# Patient Record
Sex: Male | Born: 1965 | Race: White | Hispanic: No | Marital: Single | Smoking: Never smoker
Health system: Southern US, Community
[De-identification: ages and names within clinical notes are randomized; demographics above are authoritative.]

## PROBLEM LIST (undated history)

## (undated) DIAGNOSIS — F319 Bipolar disorder, unspecified: Secondary | ICD-10-CM

## (undated) DIAGNOSIS — I10 Essential (primary) hypertension: Secondary | ICD-10-CM

## (undated) DIAGNOSIS — Z113 Encounter for screening for infections with a predominantly sexual mode of transmission: Secondary | ICD-10-CM

## (undated) DIAGNOSIS — R7309 Other abnormal glucose: Secondary | ICD-10-CM

## (undated) DIAGNOSIS — K2970 Gastritis, unspecified, without bleeding: Secondary | ICD-10-CM

## (undated) DIAGNOSIS — M26609 Unspecified temporomandibular joint disorder, unspecified side: Secondary | ICD-10-CM

## (undated) DIAGNOSIS — F909 Attention-deficit hyperactivity disorder, unspecified type: Secondary | ICD-10-CM

## (undated) DIAGNOSIS — B351 Tinea unguium: Secondary | ICD-10-CM

## (undated) DIAGNOSIS — IMO0002 Reserved for concepts with insufficient information to code with codable children: Secondary | ICD-10-CM

## (undated) DIAGNOSIS — F329 Major depressive disorder, single episode, unspecified: Secondary | ICD-10-CM

## (undated) DIAGNOSIS — F411 Generalized anxiety disorder: Secondary | ICD-10-CM

## (undated) DIAGNOSIS — Z87442 Personal history of urinary calculi: Secondary | ICD-10-CM

## (undated) DIAGNOSIS — K7689 Other specified diseases of liver: Secondary | ICD-10-CM

## (undated) DIAGNOSIS — R7303 Prediabetes: Secondary | ICD-10-CM

## (undated) DIAGNOSIS — K2990 Gastroduodenitis, unspecified, without bleeding: Secondary | ICD-10-CM

## (undated) DIAGNOSIS — M545 Low back pain: Secondary | ICD-10-CM

## (undated) DIAGNOSIS — H9190 Unspecified hearing loss, unspecified ear: Secondary | ICD-10-CM

## (undated) DIAGNOSIS — I2510 Atherosclerotic heart disease of native coronary artery without angina pectoris: Secondary | ICD-10-CM

## (undated) DIAGNOSIS — N419 Inflammatory disease of prostate, unspecified: Secondary | ICD-10-CM

## (undated) DIAGNOSIS — E7800 Pure hypercholesterolemia, unspecified: Secondary | ICD-10-CM

## (undated) DIAGNOSIS — F3289 Other specified depressive episodes: Secondary | ICD-10-CM

## (undated) DIAGNOSIS — K219 Gastro-esophageal reflux disease without esophagitis: Secondary | ICD-10-CM

## (undated) DIAGNOSIS — S83509A Sprain of unspecified cruciate ligament of unspecified knee, initial encounter: Secondary | ICD-10-CM

## (undated) DIAGNOSIS — N200 Calculus of kidney: Secondary | ICD-10-CM

## (undated) DIAGNOSIS — C801 Malignant (primary) neoplasm, unspecified: Secondary | ICD-10-CM

## (undated) DIAGNOSIS — N342 Other urethritis: Secondary | ICD-10-CM

## (undated) DIAGNOSIS — R079 Chest pain, unspecified: Secondary | ICD-10-CM

## (undated) HISTORY — DX: Pure hypercholesterolemia, unspecified: E78.00

## (undated) HISTORY — DX: Other urethritis: N34.2

## (undated) HISTORY — DX: Other abnormal glucose: R73.09

## (undated) HISTORY — DX: Unspecified temporomandibular joint disorder, unspecified side: M26.609

## (undated) HISTORY — DX: Low back pain: M54.5

## (undated) HISTORY — DX: Major depressive disorder, single episode, unspecified: F32.9

## (undated) HISTORY — DX: Encounter for screening for infections with a predominantly sexual mode of transmission: Z11.3

## (undated) HISTORY — DX: Chest pain, unspecified: R07.9

## (undated) HISTORY — DX: Calculus of kidney: N20.0

## (undated) HISTORY — DX: Inflammatory disease of prostate, unspecified: N41.9

## (undated) HISTORY — DX: Other specified depressive episodes: F32.89

## (undated) HISTORY — DX: Unspecified hearing loss, unspecified ear: H91.90

## (undated) HISTORY — DX: Generalized anxiety disorder: F41.1

## (undated) HISTORY — DX: Gastroduodenitis, unspecified, without bleeding: K29.90

## (undated) HISTORY — DX: Gastro-esophageal reflux disease without esophagitis: K21.9

## (undated) HISTORY — DX: Tinea unguium: B35.1

## (undated) HISTORY — PX: CYSTOSCOPY W/ LITHOLAPAXY / EHL: SUR377

## (undated) HISTORY — DX: Sprain of unspecified cruciate ligament of unspecified knee, initial encounter: S83.509A

## (undated) HISTORY — DX: Gastritis, unspecified, without bleeding: K29.70

## (undated) HISTORY — DX: Essential (primary) hypertension: I10

## (undated) HISTORY — PX: OTHER SURGICAL HISTORY: SHX169

## (undated) HISTORY — DX: Reserved for concepts with insufficient information to code with codable children: IMO0002

## (undated) HISTORY — DX: Other specified diseases of liver: K76.89

---

## 1999-01-09 ENCOUNTER — Emergency Department (HOSPITAL_COMMUNITY): Admission: EM | Admit: 1999-01-09 | Discharge: 1999-01-09 | Payer: Self-pay | Admitting: Emergency Medicine

## 1999-01-10 ENCOUNTER — Encounter: Payer: Self-pay | Admitting: Emergency Medicine

## 2003-04-22 ENCOUNTER — Encounter: Admission: RE | Admit: 2003-04-22 | Discharge: 2003-04-22 | Payer: Self-pay | Admitting: Family Medicine

## 2005-10-29 ENCOUNTER — Ambulatory Visit: Payer: Self-pay | Admitting: Family Medicine

## 2006-02-07 ENCOUNTER — Ambulatory Visit: Payer: Self-pay | Admitting: Family Medicine

## 2006-02-21 ENCOUNTER — Ambulatory Visit: Payer: Self-pay

## 2006-05-23 ENCOUNTER — Ambulatory Visit: Payer: Self-pay | Admitting: Family Medicine

## 2006-05-23 LAB — CONVERTED CEMR LAB
AST: 39 units/L — ABNORMAL HIGH (ref 0–37)
Albumin: 4.3 g/dL (ref 3.5–5.2)
Alkaline Phosphatase: 56 units/L (ref 39–117)
BUN: 20 mg/dL (ref 6–23)
Basophils Absolute: 0 10*3/uL (ref 0.0–0.1)
Basophils Relative: 1.1 % — ABNORMAL HIGH (ref 0.0–1.0)
Chloride: 107 meq/L (ref 96–112)
Creatinine, Ser: 1.1 mg/dL (ref 0.4–1.5)
HCT: 49 % (ref 39.0–52.0)
MCHC: 34.1 g/dL (ref 30.0–36.0)
Neutrophils Relative %: 46.4 % (ref 43.0–77.0)
RBC: 5.92 M/uL — ABNORMAL HIGH (ref 4.22–5.81)
RDW: 11.7 % (ref 11.5–14.6)
Sodium: 143 meq/L (ref 135–145)
Total Bilirubin: 1.2 mg/dL (ref 0.3–1.2)
Total CHOL/HDL Ratio: 6.7
Triglycerides: 232 mg/dL (ref 0–149)
VLDL: 46 mg/dL — ABNORMAL HIGH (ref 0–40)

## 2006-06-20 ENCOUNTER — Ambulatory Visit: Payer: Self-pay | Admitting: Family Medicine

## 2006-06-20 LAB — CONVERTED CEMR LAB
Chlamydia, DNA Probe: NEGATIVE
GC Probe Amp, Genital: NEGATIVE

## 2006-09-05 ENCOUNTER — Telehealth (INDEPENDENT_AMBULATORY_CARE_PROVIDER_SITE_OTHER): Payer: Self-pay | Admitting: *Deleted

## 2006-09-06 ENCOUNTER — Ambulatory Visit: Payer: Self-pay | Admitting: Family Medicine

## 2006-09-06 DIAGNOSIS — N342 Other urethritis: Secondary | ICD-10-CM | POA: Insufficient documentation

## 2006-09-06 DIAGNOSIS — K7689 Other specified diseases of liver: Secondary | ICD-10-CM | POA: Insufficient documentation

## 2006-09-06 DIAGNOSIS — K219 Gastro-esophageal reflux disease without esophagitis: Secondary | ICD-10-CM | POA: Insufficient documentation

## 2006-09-06 DIAGNOSIS — E7800 Pure hypercholesterolemia, unspecified: Secondary | ICD-10-CM

## 2006-09-06 DIAGNOSIS — N419 Inflammatory disease of prostate, unspecified: Secondary | ICD-10-CM | POA: Insufficient documentation

## 2006-09-06 DIAGNOSIS — F411 Generalized anxiety disorder: Secondary | ICD-10-CM | POA: Insufficient documentation

## 2006-09-06 DIAGNOSIS — J209 Acute bronchitis, unspecified: Secondary | ICD-10-CM | POA: Insufficient documentation

## 2006-09-06 DIAGNOSIS — E785 Hyperlipidemia, unspecified: Secondary | ICD-10-CM | POA: Insufficient documentation

## 2006-09-06 DIAGNOSIS — I10 Essential (primary) hypertension: Secondary | ICD-10-CM | POA: Insufficient documentation

## 2006-09-06 DIAGNOSIS — F329 Major depressive disorder, single episode, unspecified: Secondary | ICD-10-CM | POA: Insufficient documentation

## 2006-11-14 ENCOUNTER — Ambulatory Visit: Payer: Self-pay | Admitting: Family Medicine

## 2007-01-09 ENCOUNTER — Ambulatory Visit: Payer: Self-pay | Admitting: Family Medicine

## 2007-01-09 DIAGNOSIS — K2990 Gastroduodenitis, unspecified, without bleeding: Secondary | ICD-10-CM

## 2007-01-09 DIAGNOSIS — K2970 Gastritis, unspecified, without bleeding: Secondary | ICD-10-CM | POA: Insufficient documentation

## 2007-04-10 ENCOUNTER — Ambulatory Visit: Payer: Self-pay | Admitting: Family Medicine

## 2007-04-10 DIAGNOSIS — B351 Tinea unguium: Secondary | ICD-10-CM | POA: Insufficient documentation

## 2007-04-11 LAB — CONVERTED CEMR LAB
BUN: 13 mg/dL (ref 6–23)
Bilirubin, Direct: 0.1 mg/dL (ref 0.0–0.3)
Chloride: 100 meq/L (ref 96–112)
Cholesterol: 270 mg/dL (ref 0–200)
Creatinine, Ser: 1.2 mg/dL (ref 0.4–1.5)
Direct LDL: 161.5 mg/dL
GFR calc non Af Amer: 71 mL/min
HDL: 35.8 mg/dL — ABNORMAL LOW (ref >39.0)
Phosphorus: 3.4 mg/dL (ref 2.3–4.6)
Total CHOL/HDL Ratio: 7.5
Triglycerides: 372 mg/dL (ref 0–149)
VLDL: 74 mg/dL — ABNORMAL HIGH (ref 0–40)

## 2007-07-03 ENCOUNTER — Ambulatory Visit: Payer: Self-pay | Admitting: Family Medicine

## 2007-07-03 DIAGNOSIS — R7989 Other specified abnormal findings of blood chemistry: Secondary | ICD-10-CM | POA: Insufficient documentation

## 2007-07-04 LAB — CONVERTED CEMR LAB
AST: 27 units/L (ref 0–37)
HDL: 36.4 mg/dL — ABNORMAL LOW (ref >39.0)
Triglycerides: 398 mg/dL (ref 0–149)

## 2007-07-10 ENCOUNTER — Ambulatory Visit: Payer: Self-pay | Admitting: Family Medicine

## 2007-07-10 DIAGNOSIS — R7309 Other abnormal glucose: Secondary | ICD-10-CM

## 2007-07-10 DIAGNOSIS — R7303 Prediabetes: Secondary | ICD-10-CM | POA: Insufficient documentation

## 2007-07-10 HISTORY — DX: Other abnormal glucose: R73.09

## 2007-09-11 ENCOUNTER — Ambulatory Visit: Payer: Self-pay | Admitting: Family Medicine

## 2007-10-09 ENCOUNTER — Ambulatory Visit: Payer: Self-pay | Admitting: Family Medicine

## 2007-10-10 LAB — CONVERTED CEMR LAB
Cholesterol: 290 mg/dL (ref 0–200)
Direct LDL: 141.8 mg/dL
Total CHOL/HDL Ratio: 7.7
Triglycerides: 624 mg/dL (ref 0–149)

## 2007-10-16 ENCOUNTER — Ambulatory Visit: Payer: Self-pay | Admitting: Family Medicine

## 2007-10-27 ENCOUNTER — Telehealth (INDEPENDENT_AMBULATORY_CARE_PROVIDER_SITE_OTHER): Payer: Self-pay | Admitting: *Deleted

## 2007-12-04 ENCOUNTER — Ambulatory Visit: Payer: Self-pay | Admitting: Family Medicine

## 2007-12-26 LAB — CONVERTED CEMR LAB
AST: 31 units/L (ref 0–37)
Albumin: 4.6 g/dL (ref 3.5–5.2)
BUN: 24 mg/dL — ABNORMAL HIGH (ref 6–23)
Calcium: 9.3 mg/dL (ref 8.4–10.5)
Creatinine, Ser: 0.9 mg/dL (ref 0.4–1.5)
HDL: 41.7 mg/dL (ref >39.0)
Phosphorus: 3.7 mg/dL (ref 2.3–4.6)
Potassium: 4.1 meq/L (ref 3.5–5.1)
Triglycerides: 94 mg/dL (ref 0–149)
VLDL: 19 mg/dL (ref 0–40)

## 2008-03-18 ENCOUNTER — Telehealth: Payer: Self-pay | Admitting: Family Medicine

## 2008-03-18 DIAGNOSIS — H9190 Unspecified hearing loss, unspecified ear: Secondary | ICD-10-CM | POA: Insufficient documentation

## 2008-05-28 ENCOUNTER — Ambulatory Visit: Payer: Self-pay | Admitting: Family Medicine

## 2008-05-28 DIAGNOSIS — M25539 Pain in unspecified wrist: Secondary | ICD-10-CM | POA: Insufficient documentation

## 2008-05-28 DIAGNOSIS — M2390 Unspecified internal derangement of unspecified knee: Secondary | ICD-10-CM | POA: Insufficient documentation

## 2008-05-29 ENCOUNTER — Encounter: Admission: RE | Admit: 2008-05-29 | Discharge: 2008-05-29 | Payer: Self-pay | Admitting: Family Medicine

## 2008-05-29 ENCOUNTER — Telehealth: Payer: Self-pay | Admitting: Family Medicine

## 2008-05-30 ENCOUNTER — Encounter: Admission: RE | Admit: 2008-05-30 | Discharge: 2008-05-30 | Payer: Self-pay | Admitting: Family Medicine

## 2008-05-31 ENCOUNTER — Encounter: Admission: RE | Admit: 2008-05-31 | Discharge: 2008-05-31 | Payer: Self-pay | Admitting: Family Medicine

## 2008-05-31 DIAGNOSIS — IMO0002 Reserved for concepts with insufficient information to code with codable children: Secondary | ICD-10-CM | POA: Insufficient documentation

## 2008-05-31 DIAGNOSIS — S83509A Sprain of unspecified cruciate ligament of unspecified knee, initial encounter: Secondary | ICD-10-CM | POA: Insufficient documentation

## 2008-06-03 ENCOUNTER — Encounter: Payer: Self-pay | Admitting: Family Medicine

## 2008-06-10 ENCOUNTER — Ambulatory Visit: Payer: Self-pay | Admitting: Family Medicine

## 2008-07-22 ENCOUNTER — Ambulatory Visit: Payer: Self-pay | Admitting: Family Medicine

## 2008-07-23 LAB — CONVERTED CEMR LAB
ALT: 27 units/L (ref 0–53)
HDL: 35.6 mg/dL — ABNORMAL LOW (ref >39.00)
Total CHOL/HDL Ratio: 4
Triglycerides: 225 mg/dL — ABNORMAL HIGH (ref 0.0–149.0)
VLDL: 45 mg/dL — ABNORMAL HIGH (ref 0.0–40.0)

## 2008-12-02 ENCOUNTER — Telehealth: Payer: Self-pay | Admitting: Family Medicine

## 2008-12-03 ENCOUNTER — Encounter: Payer: Self-pay | Admitting: Family Medicine

## 2008-12-04 ENCOUNTER — Ambulatory Visit: Payer: Self-pay | Admitting: Family Medicine

## 2008-12-04 DIAGNOSIS — R079 Chest pain, unspecified: Secondary | ICD-10-CM

## 2008-12-04 HISTORY — DX: Chest pain, unspecified: R07.9

## 2008-12-05 ENCOUNTER — Ambulatory Visit: Payer: Self-pay | Admitting: Cardiology

## 2008-12-26 ENCOUNTER — Ambulatory Visit (HOSPITAL_COMMUNITY): Admission: RE | Admit: 2008-12-26 | Discharge: 2008-12-26 | Payer: Self-pay | Admitting: Cardiology

## 2008-12-26 ENCOUNTER — Ambulatory Visit: Payer: Self-pay | Admitting: Cardiology

## 2008-12-26 ENCOUNTER — Ambulatory Visit: Payer: Self-pay

## 2008-12-26 ENCOUNTER — Encounter: Payer: Self-pay | Admitting: Cardiology

## 2009-01-18 ENCOUNTER — Ambulatory Visit: Payer: Self-pay | Admitting: Family Medicine

## 2009-01-18 ENCOUNTER — Encounter: Payer: Self-pay | Admitting: Family Medicine

## 2009-01-19 ENCOUNTER — Telehealth: Payer: Self-pay | Admitting: Family Medicine

## 2009-01-20 ENCOUNTER — Ambulatory Visit: Payer: Self-pay | Admitting: Family Medicine

## 2009-01-22 ENCOUNTER — Telehealth: Payer: Self-pay | Admitting: Family Medicine

## 2009-10-29 ENCOUNTER — Ambulatory Visit: Payer: Self-pay | Admitting: Family Medicine

## 2009-10-29 DIAGNOSIS — M25519 Pain in unspecified shoulder: Secondary | ICD-10-CM | POA: Insufficient documentation

## 2009-10-29 DIAGNOSIS — B360 Pityriasis versicolor: Secondary | ICD-10-CM | POA: Insufficient documentation

## 2009-10-31 LAB — CONVERTED CEMR LAB
AST: 37 units/L (ref 0–37)
BUN: 21 mg/dL (ref 6–23)
Chloride: 104 meq/L (ref 96–112)
Direct LDL: 168.3 mg/dL
GFR calc non Af Amer: 91.04 mL/min (ref >60)
Glucose, Bld: 89 mg/dL (ref 70–99)
HDL: 50.2 mg/dL (ref >39.00)
Phosphorus: 3.2 mg/dL (ref 2.3–4.6)
Potassium: 4.1 meq/L (ref 3.5–5.1)
Total CHOL/HDL Ratio: 5
VLDL: 59.8 mg/dL — ABNORMAL HIGH (ref 0.0–40.0)

## 2010-04-12 ENCOUNTER — Encounter: Payer: Self-pay | Admitting: Orthopedic Surgery

## 2010-04-19 LAB — CONVERTED CEMR LAB
Blood in Urine, dipstick: NEGATIVE
Nitrite: NEGATIVE
Protein, U semiquant: NEGATIVE
Urobilinogen, UA: 0.2
WBC Urine, dipstick: NEGATIVE

## 2010-04-21 NOTE — Assessment & Plan Note (Signed)
Summary: shoulder pain/alc   Vital Signs:  Patient profile:   45 year old male Height:      66 inches Weight:      194.50 pounds BMI:     31.51 Temp:     98.3 degrees F oral Pulse rate:   80 / minute Pulse rhythm:   regular BP sitting:   124 / 86  (left arm) Cuff size:   large  Vitals Entered By: Lewanda Rife LPN (October 29, 2009 3:42 PM) CC: both shoulders hurt on and off for one yr. Pain level now is 6.   History of Present Illness: has been living with shoulder pain for a while  2 years ago noticed it in L shoulder - when working out (on forced abduction) then started in R shoulder (R handed)  now all the time sharp pains when doing something   is more painful to abduct R shoulder than L  very sharp when moving the wrong way- dull otherwise  ? 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  no major trauma  shoots down the arm   tried some aleve - only helped a little  no ice or heat   needs refil of shampoo for tinea versicolor  usually works after 1 applic  mood is much better   is overdue for lab/ lipids  diet is fair overall and no trouble with meds   Allergies: 1)  Sulfa  Past History:  Past Medical History: Last updated: 12/04/2008 Current Problems:  MEDIAL MENISCUS TEAR, LEFT (ICD-836.0) ACL TEAR, LEFT KNEE (ICD-844.2) WRIST PAIN, LEFT (ICD-719.43) WRIST PAIN, RIGHT (ICD-719.43) UNSPECIFIED INTERNAL DERANGEMENT OF KNEE (ICD-717.9) PROBLEMS WITH HEARING (ICD-V41.2) HYPERGLYCEMIA (ICD-790.29) OTHER ABNORMAL BLOOD CHEMISTRY (ICD-790.6) FERRITIN, ELEVATED (ICD-790.6) DERMATOPHYTOSIS OF NAIL (ICD-110.1) GASTRITIS (ICD-535.50) BRONCHITIS, ACUTE (ICD-466.0) URETHRITIS NOS (ICD-597.80) Hx of PROSTATITIS, MILD (ICD-601.9) Hx of FATTY LIVER DISEASE (ICD-571.8) Hx of HYPERCHOLESTEROLEMIA (ICD-272.0) HYPERTENSION (ICD-401.9) GERD (ICD-530.81) DEPRESSION (ICD-311) ANXIETY (ICD-300.00)  Past Surgical History: Last updated:  12/05/2008 none  Family History: Last updated: 12/05/2008 Father: Congestive heart failure, DM, HTN  Social History: Last updated: 12/05/2008 Marital Status: single Children: 0 Occupation: owns own motorcycle shop non smoker Alcohol Use - yes  Review of Systems General:  Denies fatigue, loss of appetite, and malaise. Eyes:  Denies blurring and eye irritation. CV:  Denies chest pain or discomfort, fatigue, and lightheadness. Resp:  Denies cough, pleuritic, and wheezing. GI:  Denies abdominal pain and change in bowel habits. MS:  Complains of joint pain and stiffness; denies joint redness, joint swelling, cramps, and muscle weakness. Derm:  Denies lesion(s) and rash. Neuro:  Denies numbness, tingling, and weakness. Psych:  mood is fairly stable . Endo:  Denies cold intolerance, excessive thirst, excessive urination, and heat intolerance. Heme:  Denies abnormal bruising and bleeding.  Physical Exam  General:  overweight but generally well appearing  Head:  normocephalic, atraumatic, and no abnormalities observed.   Eyes:  vision grossly intact, pupils equal, and pupils round.   Ears:  pt talks loudly  Mouth:  pharynx pink and moist.   Neck:  supple with full rom and no masses or thyromegally, no JVD or carotid bruit  Lungs:  Normal respiratory effort, chest expands symmetrically. Lungs are clear to auscultation, no crackles or wheezes. Heart:  Normal rate and regular rhythm. S1 and S2 normal without gallop, murmur, click, rub or other extra sounds. Msk:  R shoulder -limited abduction and int rot due to pain no swelling /  heat or deformity no acrom tenderness pos hawking/ neer tests  no crepitice  nl L shoulder exam  Extremities:  No clubbing, cyanosis, edema, or deformity noted with normal full range of motion of all joints.   Neurologic:  sensation intact to light touch, sensation intact to pinprick, and gait normal.   Skin:  few hypopigmented oval lesions on back  consistent with prev tinea versicolor Cervical Nodes:  No lymphadenopathy noted Psych:  normal affect, talkative and pleasant    Impression & Recommendations:  Problem # 1:  SHOULDER PAIN, BILATERAL (ICD-719.41) Assessment New  worse on R suspect rot cuff tendonitis recommend relative rest/ passive rom exercises/ ice /nsaid for 2 wk if not imp- f/u Dr Patsy Lager  His updated medication list for this problem includes:    Mobic 15 Mg Tabs (Meloxicam) .Marland Kitchen... 1 by mouth once daily with food for 14 days  Orders: Prescription Created Electronically [phone])  Problem # 2:  Hx of HYPERCHOLESTEROLEMIA (ICD-272.0) Assessment: Unchanged  overdue for labs  done today rev low sat fat diet  realize not fasting His updated medication list for this problem includes:    Zocor 20 Mg Tabs (Simvastatin) .Marland Kitchen... 1 by mouth once daily in evening  Orders: Venipuncture (60454) TLB-Lipid Panel (80061-LIPID) TLB-Renal Function Panel (80069-RENAL) TLB-ALT (SGPT) (84460-ALT) TLB-AST (SGOT) (84450-SGOT) Prescription Created Electronically [phone])  Labs Reviewed: SGOT: 23 (07/22/2008)   SGPT: 27 (07/22/2008)   HDL:35.60 (07/22/2008), 41.7 (12/04/2007)  LDL:DEL (12/04/2007), DEL (10/09/2007)  Chol:152 (07/22/2008), 225 (12/04/2007)  Trig:225.0 (07/22/2008), 94 (12/04/2007)  Problem # 3:  HYPERTENSION (ICD-401.9) Assessment: Unchanged good control with norvasc  lab today His updated medication list for this problem includes:    Norvasc 5 Mg Tabs (Amlodipine besylate) .Marland Kitchen... 1 by mouth each am  Orders: Venipuncture (91478) TLB-Lipid Panel (80061-LIPID) TLB-Renal Function Panel (80069-RENAL) TLB-ALT (SGPT) (84460-ALT) TLB-AST (SGOT) (84450-SGOT) Prescription Created Electronically [id number])  BP today: 124/86 Prior BP: 122/90 (01/20/2009)  Labs Reviewed: K+: 4.1 (12/04/2007) Creat: : 0.9 (12/04/2007)   Chol: 152 (07/22/2008)   HDL: 35.60 (07/22/2008)   LDL: DEL (12/04/2007)   TG: 225.0  (07/22/2008)  Problem # 4:  TINEA VERSICOLOR (ICD-111.0)  mild and occasional on back  refil nizoral 2% which he uses occ topically His updated medication list for this problem includes:    Nizoral 2 % Sham (Ketoconazole) .Marland Kitchen... Apply to affected area- let sit 5 minutes and then rinse (can use twice per week)  Orders: Prescription Created Electronically [phone])  Complete Medication List: 1)  Norvasc 5 Mg Tabs (Amlodipine besylate) .Marland Kitchen.. 1 by mouth each am 2)  Zoloft 100 Mg Tabs (Sertraline hcl) .... Take 1  by mouth daily 3)  Multivitamins Tabs (Multiple vitamin) .... Take 1 tablet by mouth once a day 4)  Fish Oil Oil (Fish oil) .... Take 1 tablet by mouth once a day 5)  Zocor 20 Mg Tabs (Simvastatin) .Marland Kitchen.. 1 by mouth once daily in evening 6)  Mobic 15 Mg Tabs (Meloxicam) .Marland Kitchen.. 1 by mouth once daily with food for 14 days 7)  Nizoral 2 % Sham (Ketoconazole) .... Apply to affected area- let sit 5 minutes and then rinse (can use twice per week)  Patient Instructions: 1)  use ice on shoulder two times a day for 10 minutes 2)  take mobic 15 mg once daily for 14 days (with food)  3)  try to rest shoulder as much as possible  4)  do finger crawl wall exercise  5)  in 2 weeks - if  not a lot better - call for appt with Dr Patsy Lager  Prescriptions: NIZORAL 2 % SHAM (KETOCONAZOLE) apply to affected area- let sit 5 minutes and then rinse (can use twice per week)  #1 med x 0   Entered and Authorized by:   Judith Part MD   Signed by:   Judith Part MD on 10/29/2009   Method used:   [facility] to        [id number]       [address]       [place], [place]  [address]       Ph: [phone]       Fax: [phone]   RxID:   [id number] MOBIC 15 MG TABS (MELOXICAM) 1 by mouth once daily with food for 14 days  #30 x 0   Entered and Authorized by:   Judith Part MD   Signed by:   Judith Part MD on 10/29/2009   Method used:    [facility] to        [id number]       [address]       [place], [place]  [address]       Ph: [phone]       Fax: [phone]   RxID:   [id number]   Current Allergies (reviewed today): SULFA

## 2010-05-27 ENCOUNTER — Encounter: Payer: Self-pay | Admitting: Family Medicine

## 2010-06-30 ENCOUNTER — Other Ambulatory Visit: Payer: Self-pay | Admitting: Family Medicine

## 2010-07-14 ENCOUNTER — Ambulatory Visit (INDEPENDENT_AMBULATORY_CARE_PROVIDER_SITE_OTHER): Payer: BC Managed Care – PPO | Admitting: Family Medicine

## 2010-07-14 ENCOUNTER — Encounter: Payer: Self-pay | Admitting: Family Medicine

## 2010-07-14 VITALS — BP 136/94 | HR 80 | Temp 98.5°F | Ht 66.5 in | Wt 213.0 lb

## 2010-07-14 DIAGNOSIS — L03211 Cellulitis of face: Secondary | ICD-10-CM | POA: Insufficient documentation

## 2010-07-14 DIAGNOSIS — L0201 Cutaneous abscess of face: Secondary | ICD-10-CM

## 2010-07-14 MED ORDER — CLINDAMYCIN HCL 300 MG PO CAPS: 300.0000 mg | ORAL_CAPSULE | Freq: Three times a day (TID) | ORAL | Status: AC

## 2010-07-14 NOTE — Progress Notes (Signed)
  Subjective:    Patient ID: Glenn Stewart, male    DOB: [birth year], 45 y.o.   MRN: [id number]  HPI CC: earache and eye swelling  Several day h/o earache, knot anterior to ear, as well as starting puffiness around eye and rash on forehead.  This morning eye puffy shut closed.  No drainage, vision changes, mating of eyelashes.  Swelling improved with movement.  Also with mild headache.  Rash not itchy, + painful.  Mild ST.  Decreased hearing R side.  No fevers/chills, abd pain, n/v/d, cough.  No drainage from ears, ringing, vision changes, pain with eye movement.  No cough, recent viral illness.  May have started as ST initially then progressed to ear/eye sxs.  No tooth pain.  No sick contacts.  No smokers at home.  No ho asthma/allergies.  Recently restarted abilify 1 1/2 mo ago.  Has tried triple abx cream as well as sweet oil.  Medications and allergies reviewed and updated as above. PMhx reviewed for relevance.  Review of Systems Per HPI    Objective:   Physical Exam  Constitutional: He appears well-developed and well-nourished. No distress.  HENT:  Head: Normocephalic and atraumatic.    Right Ear: Tympanic membrane, external ear and ear canal normal. Decreased hearing is noted.  Left Ear: Tympanic membrane, external ear and ear canal normal. Decreased hearing is noted.  Nose: Nose normal.  Mouth/Throat: Oropharynx is clear and moist. No oropharyngeal exudate.       Erythematous rash L forehead 3 separate macular lesions, minimal crusting. Decreased hearing at baseline per patient. No trismus. No TMJ tenderness, no maxillary tenderness.  Eyes: Conjunctivae and EOM are normal. Pupils are equal, round, and reactive to light. No scleral icterus.       EOMI without pain.  + L eyelid swelling  Neck: Normal range of motion. Neck supple. No thyromegaly present.  Cardiovascular: Normal rate, regular rhythm, normal heart sounds and intact distal pulses.   No murmur  heard. Pulmonary/Chest: Effort normal and breath sounds normal. No respiratory distress. He has no wheezes. He has no rales.  Lymphadenopathy:       Head (right side): No tonsillar, no preauricular, no posterior auricular and no occipital adenopathy present.       Head (left side): Preauricular and posterior auricular adenopathy present. No tonsillar and no occipital adenopathy present.    He has no cervical adenopathy.  Neurological: No cranial nerve deficit.  Skin: Skin is warm and dry. Rash noted.  Psychiatric: He has a normal mood and affect.          Assessment & Plan:

## 2010-07-14 NOTE — Assessment & Plan Note (Signed)
Suspicious for preseptal cellulitis.  Start clindamycin tid x 10 days, update [place] if red flags. Discussed concern near eye, but no evidence of orbital cellulitis currently. Ok to treat as outpatient discussed need to return if worsening or not improving as expected. ? Viral illness prior leading to cellulitis.

## 2010-07-14 NOTE — Patient Instructions (Addendum)
I think you have a facial cellulitis. Treat with antibiotics three times daily for 10 days.  Take some yogurt for stomach while on antibiotics. Warm compresses on eye/forehead daily to help resolve quicker. If fever >101.5, spreading redness or swelling or pain, or pain with eye movements, please let us know as this is close in proximity to the eye.

## 2010-07-22 ENCOUNTER — Encounter: Payer: Self-pay | Admitting: Family Medicine

## 2010-07-22 ENCOUNTER — Ambulatory Visit (INDEPENDENT_AMBULATORY_CARE_PROVIDER_SITE_OTHER): Payer: BC Managed Care – PPO | Admitting: Family Medicine

## 2010-07-22 DIAGNOSIS — Z0000 Encounter for general adult medical examination without abnormal findings: Secondary | ICD-10-CM | POA: Insufficient documentation

## 2010-07-22 DIAGNOSIS — L0201 Cutaneous abscess of face: Secondary | ICD-10-CM

## 2010-07-22 DIAGNOSIS — F411 Generalized anxiety disorder: Secondary | ICD-10-CM

## 2010-07-22 DIAGNOSIS — L03211 Cellulitis of face: Secondary | ICD-10-CM

## 2010-07-22 DIAGNOSIS — E7800 Pure hypercholesterolemia, unspecified: Secondary | ICD-10-CM

## 2010-07-22 DIAGNOSIS — R7309 Other abnormal glucose: Secondary | ICD-10-CM

## 2010-07-22 DIAGNOSIS — I10 Essential (primary) hypertension: Secondary | ICD-10-CM

## 2010-07-22 DIAGNOSIS — H9190 Unspecified hearing loss, unspecified ear: Secondary | ICD-10-CM

## 2010-07-22 LAB — COMPREHENSIVE METABOLIC PANEL
ALT: 51 U/L (ref 0–53)
AST: 36 U/L (ref 0–37)
Alkaline Phosphatase: 49 U/L (ref 39–117)
CO2: 29 mEq/L (ref 19–32)
Creatinine, Ser: 1 mg/dL (ref 0.4–1.5)
Sodium: 139 mEq/L (ref 135–145)
Total Bilirubin: 1 mg/dL (ref 0.3–1.2)
Total Protein: 7.2 g/dL (ref 6.0–8.3)

## 2010-07-22 LAB — CBC WITH DIFFERENTIAL/PLATELET
Basophils Absolute: 0 10*3/uL (ref 0.0–0.1)
Eosinophils Absolute: 0.2 10*3/uL (ref 0.0–0.7)
HCT: 47.6 % (ref 39.0–52.0)
Hemoglobin: 16 g/dL (ref 13.0–17.0)
Lymphs Abs: 2.6 10*3/uL (ref 0.7–4.0)
MCHC: 33.7 g/dL (ref 30.0–36.0)
Monocytes Relative: 5.3 % (ref 3.0–12.0)
Neutro Abs: 3.7 10*3/uL (ref 1.4–7.7)
Platelets: 226 10*3/uL (ref 150.0–400.0)
RDW: 13.1 % (ref 11.5–14.6)

## 2010-07-22 LAB — LIPID PANEL
Cholesterol: 227 mg/dL — ABNORMAL HIGH (ref 0–200)
HDL: 43.3 mg/dL (ref >39.00)
Total CHOL/HDL Ratio: 5
Triglycerides: 416 mg/dL — ABNORMAL HIGH (ref 0.0–149.0)
VLDL: 83.2 mg/dL — ABNORMAL HIGH (ref 0.0–40.0)

## 2010-07-22 LAB — LDL CHOLESTEROL, DIRECT: Direct LDL: 125.9 mg/dL

## 2010-07-22 MED ORDER — HYDROCODONE-ACETAMINOPHEN 5-500 MG PO TABS: 1.0000 | ORAL_TABLET | Freq: Four times a day (QID) | ORAL | Status: DC | PRN

## 2010-07-22 MED ORDER — AMLODIPINE BESYLATE 5 MG PO TABS: 5.0000 mg | ORAL_TABLET | Freq: Every day | ORAL | Status: DC

## 2010-07-22 MED ORDER — SIMVASTATIN 20 MG PO TABS: 20.0000 mg | ORAL_TABLET | Freq: Every day | ORAL | Status: DC

## 2010-07-22 NOTE — Assessment & Plan Note (Signed)
Reviewed health habits including diet and exercise and skin cancer prevention Also reviewed health mt list, fam hx and immunizations  Labs today Will sched Tdap in 1 mo when facial symptoms resolve

## 2010-07-22 NOTE — Assessment & Plan Note (Signed)
This may be worse with wt gain/ poor eating and abilify Disc s/s of DM (has fam hx) Lab today a1c  Disc low glycemic diet

## 2010-07-22 NOTE — Progress Notes (Signed)
Subjective:    Patient ID: Glenn Stewart, male    DOB: 1965/12/11, 45 y.o.   MRN: [id number]  HPI here for health mt exam and also re check cellulitis and new headache Was seen recently by Dr Reece Agar for facial cellulitis (preseptal) -- which started as an earache and swollen glands  tx with clindamycin for 10 days for this  This is healing very well - pleased with that  Eye is no longer swollen  Skin is dry and itchy as it heals   Now having headache- a stabbing sensation -- off and on  ? Stress related  Is hurting at place about eye  No vision problems    Td - is due for when his face is better   Lipids-- is due for check  Is having a hard time with his diet  Is an emotional eater- and food is comfort for him  Eats at the end of the day - not even the right foods  Has disc this with his psychiatrist   Added abilify not too long ago  Felt better at a lower dosage -- and feels worse  Seen every 6 months  meds keep him from having outbursts  Very stressful work situation at this time  Dx with anxiety disorder- not bipolar  Is easily overwhelmed  Takes xanax for emergencies  Does not sleep well in generally   Still has hearing problems -- is going to go for evaluations  Will get some hearing aides   HTN in good control with 124/78  Hx of fatty liver  Hx of hyperglycemia   Wt is down 1 lb with 33 bmi (had lost a lot and then gained it back again (was down to 190) No regular exercise other than work - wants to get started with something  Heavy lifting at work and not in great shape for it   No prostate problems  Drinking more water         Review of Systems Review of Systems  Constitutional: Negative for fever, appetite change, fatigue and unexpected weight change.  Eyes: Negative for pain and visual disturbance.  Respiratory: Negative for cough and shortness of breath.   Cardiovascular: Negative.   Gastrointestinal: Negative for nausea, diarrhea and  constipation.  Genitourinary: Negative for urgency and frequency.  Skin: Negative for pallor. , pos for rash- almost healed  Neurological: Negative for weakness, light-headedness, numbness, pos for headaches Hematological: Negative for adenopathy. Does not bruise/bleed easily.  Psychiatric/Behavioral: Negative for dysphoric mood. The patient is not nervous/anxious.          Objective:   Physical Exam  Constitutional: He appears well-developed and well-nourished. No distress.       overwt and well appearing   HENT:  Head: Normocephalic and atraumatic.  Right Ear: External ear normal.  Nose: Nose normal.  Mouth/Throat: Oropharynx is clear and moist.       No temporal tenderness   Eyes: EOM are normal. Pupils are equal, round, and reactive to light. Right eye exhibits no discharge. Left eye exhibits no discharge. No scleral icterus.       No orbital swelling   Neck: Normal range of motion. Neck supple. Carotid bruit is not present. No thyromegaly present.  Cardiovascular: Normal rate, regular rhythm and normal heart sounds.   Pulmonary/Chest: Breath sounds normal. No respiratory distress. He has no wheezes.  Abdominal: Soft. Bowel sounds are normal. He exhibits no abdominal bruit and no mass. There is no tenderness.  Genitourinary: Rectum normal and prostate normal. Guaiac negative stool.  Musculoskeletal: Normal range of motion. He exhibits no edema and no tenderness.  Lymphadenopathy:    He has no cervical adenopathy.  Neurological: He is alert. He has normal reflexes. No cranial nerve deficit. Coordination normal.  Skin: Skin is warm and dry. Rash noted. No erythema. No pallor.       Few faint healing papules over L temple and forehead  Rash is almost gone No redness or swelling of eye or face  Psychiatric: He has a normal mood and affect.       Less anxious than usual Talks very loud           Assessment & Plan:

## 2010-07-22 NOTE — Assessment & Plan Note (Signed)
Pt is going to be eval for hearing aids  I suspect this will imp quality of life

## 2010-07-22 NOTE — Assessment & Plan Note (Signed)
Good control  Urged to work on wt loss and exercise again

## 2010-07-22 NOTE — Assessment & Plan Note (Signed)
Labs today on zocor  Is also on amlodipine with no problems but if chol up or myalgias will need to switch agents (disc this with pt )  Will also check lfts

## 2010-07-22 NOTE — Assessment & Plan Note (Signed)
Looking good - healing well In light of headaches - which are improving - I do suspect he may have had mild zoster No ocular involvement but will follow closely vicodin given prn severe ha with caution

## 2010-07-22 NOTE — Assessment & Plan Note (Signed)
Urged to continue f/u with Dr Delle Reining for med management Disc stress reduction May need to consider career change

## 2010-07-22 NOTE — Patient Instructions (Addendum)
Please schedule nurse visit in 1 month for Tdap vaccine (when your facial pain is improved ) I think the headache is related to the rash you had -- possibly shingles  Work on regular exercise 5 days per week  Try the vicodin with caution for pain  Please update me asap if worse headache or any vision or eye changes  Labs today  Get your hearing checked

## 2010-08-27 ENCOUNTER — Ambulatory Visit (INDEPENDENT_AMBULATORY_CARE_PROVIDER_SITE_OTHER): Payer: BC Managed Care – PPO | Admitting: Family Medicine

## 2010-08-27 DIAGNOSIS — Z23 Encounter for immunization: Secondary | ICD-10-CM

## 2010-08-27 NOTE — Progress Notes (Signed)
rn visit

## 2010-09-22 ENCOUNTER — Ambulatory Visit (INDEPENDENT_AMBULATORY_CARE_PROVIDER_SITE_OTHER): Payer: BC Managed Care – PPO | Admitting: Family Medicine

## 2010-09-22 ENCOUNTER — Encounter: Payer: Self-pay | Admitting: Family Medicine

## 2010-09-22 VITALS — BP 118/84 | HR 84 | Temp 98.1°F | Ht 66.5 in | Wt 192.8 lb

## 2010-09-22 DIAGNOSIS — N200 Calculus of kidney: Secondary | ICD-10-CM | POA: Insufficient documentation

## 2010-09-22 DIAGNOSIS — R319 Hematuria, unspecified: Secondary | ICD-10-CM | POA: Insufficient documentation

## 2010-09-22 DIAGNOSIS — M549 Dorsalgia, unspecified: Secondary | ICD-10-CM

## 2010-09-22 HISTORY — DX: Calculus of kidney: N20.0

## 2010-09-22 LAB — POCT URINALYSIS DIPSTICK
Glucose, UA: NEGATIVE
Nitrite, UA: NEGATIVE
Spec Grav, UA: 1.01
Urobilinogen, UA: 0.2
pH, UA: 6

## 2010-09-22 MED ORDER — HYDROCODONE-ACETAMINOPHEN 5-500 MG PO TABS: 1.0000 | ORAL_TABLET | ORAL | Status: DC | PRN

## 2010-09-22 NOTE — Progress Notes (Signed)
Subjective:    Patient ID: Glenn Stewart, male    DOB: [birth year], 45 y.o.   MRN: [id number]  HPI Here for R side abd and back pain  Passed kidney stone a week ago That day had pain into low abd and R back and chill-- lasted 15 -20 min and got better The next day passed a rather large stone - brought it with him   Pain started again several days ago -- same nature with a chill as well  Pain some sand goes and severe at times Strange odor in urine occas- comes and goes ( did eat asparagus)    Has had multiple stones in past - 3 in his life- but not in 15 years  [facility] Isabel Caprice in the past   Mouth feels dry - but drinking lots of water    Wt is down 20 lb - eating healthy - no sweets or bread - no sodas  Some power aide  Drinks lots of water   Also started focalin xr from his psychiatrist   Patient Active Problem List  Diagnoses  . DERMATOPHYTOSIS OF NAIL  . TINEA VERSICOLOR  . HYPERCHOLESTEROLEMIA  . ANXIETY  . DEPRESSION  . HYPERTENSION  . BRONCHITIS, ACUTE  . GERD  . GASTRITIS  . FATTY LIVER DISEASE  . Pain in joint, forearm  . CHEST PAIN  . HYPERGLYCEMIA  . MEDIAL MENISCUS TEAR, LEFT  . ACL TEAR, LEFT KNEE  . PROBLEMS WITH HEARING  . Facial cellulitis  . Routine general medical examination at a health care facility  . Kidney stones  . Hematuria   Past Medical History  Diagnosis Date  . Tear of medial cartilage or meniscus of knee, current   . Sprain of cruciate ligament of knee   . Pain in joint, forearm   . Unspecified internal derangement of knee   . Problems with hearing   . Other abnormal glucose   . Other abnormal blood chemistry   . Dermatophytosis of nail   . Unspecified gastritis and gastroduodenitis without mention of hemorrhage   . Acute bronchitis   . Urethritis, unspecified   . Prostatitis, unspecified   . Other chronic nonalcoholic liver disease   . Pure hypercholesterolemia   . Unspecified essential hypertension   .  Esophageal reflux   . Depressive disorder, not elsewhere classified   . Anxiety state, unspecified    No past surgical history on file. History  Substance Use Topics  . Smoking status: Never Smoker   . Smokeless tobacco: Not on file  . Alcohol Use: Yes   Family History  Problem Relation Age of Onset  . Diabetes Father   . Hypertension Father    Allergies  Allergen Reactions  . Sulfonamide Derivatives     REACTION: hives   Current Outpatient Prescriptions on File Prior to Visit  Medication Sig Dispense Refill  . amLODipine (NORVASC) 5 MG tablet Take 1 tablet (5 mg total) by mouth daily.  30 tablet  11  . fish oil-omega-3 fatty acids 1000 MG capsule Take 2 g by mouth daily.        Marland Kitchen HYDROcodone-acetaminophen (VICODIN) 5-500 MG per tablet Take 1 tablet by mouth every 6 (six) hours as needed for pain (watch for sedation ).  20 tablet  0  . Multiple Vitamin (MULTIVITAMIN) capsule Take 1 capsule by mouth daily.        . sertraline (ZOLOFT) 100 MG tablet Take 100 mg by mouth  daily.        . simvastatin (ZOCOR) 20 MG tablet Take 1 tablet (20 mg total) by mouth at bedtime.  30 tablet  11  . ARIPiprazole (ABILIFY) 5 MG tablet Take 5 mg by mouth daily.        Marland Kitchen ketoconazole (NIZORAL) 2 % shampoo Apply 1 application topically 2 (two) times a week.            Review of Systems Review of Systems  Constitutional: Negative for fever, appetite change, fatigue and unexpected weight change.  Eyes: Negative for pain and visual disturbance.  Respiratory: Negative for cough and shortness of breath.   Cardiovascular: Negative.  for cp or palp Gastrointestinal: Negative for nausea, diarrhea and constipation.  Genitourinary: Negative for urgency and frequency. or hematuria / pos for back pain and side pain Skin: Negative for pallor.  Neurological: Negative for weakness, light-headedness, numbness and headaches.  Hematological: Negative for adenopathy. Does not bruise/bleed easily.    Psychiatric/Behavioral: Negative for dysphoric mood. The patient is not nervous/anxious.          Objective:   Physical Exam  Constitutional: He appears well-developed and well-nourished. No distress.       Weight loss noted Mildly uncomfortable today  HENT:  Head: Normocephalic and atraumatic.  Eyes: Conjunctivae and EOM are normal. Pupils are equal, round, and reactive to light.  Neck: Normal range of motion. Neck supple. No thyromegaly present.  Cardiovascular: Normal rate, regular rhythm and normal heart sounds.   Pulmonary/Chest: Effort normal and breath sounds normal. No respiratory distress. He has no wheezes.  Abdominal: Soft. Bowel sounds are normal. He exhibits no distension and no mass. There is tenderness. There is no rebound and no guarding.       Mild R sided suprapubic tenderness without rebound or gaurding   Musculoskeletal: Normal range of motion.       Mild R CVA tenderness  Lymphadenopathy:    He has no cervical adenopathy.  Neurological: He is alert. He has normal reflexes. Coordination normal.  Skin: Skin is warm and dry. No rash noted. No erythema. No pallor.  Psychiatric: He has a normal mood and affect.          Assessment & Plan:

## 2010-09-22 NOTE — Assessment & Plan Note (Signed)
Recent passage of kidney stone -- with residual and intermittent colicky pain over R flank and back  ua shows rbc and some wbc  Sent urine for cx  Also CT of abd/ pelvis without contrast  Pend result May need to see urol vicodin prn

## 2010-09-22 NOTE — Patient Instructions (Signed)
We will set you up for CT scan at check out  Drink lots of water  We will update you with results of that and also urine culture If pain worsens or fever - please update me  Take vicodin if needed for severe pain

## 2010-09-28 ENCOUNTER — Other Ambulatory Visit: Payer: BC Managed Care – PPO

## 2010-09-28 ENCOUNTER — Encounter: Payer: Self-pay | Admitting: Family Medicine

## 2010-09-29 ENCOUNTER — Telehealth: Payer: Self-pay | Admitting: Family Medicine

## 2010-09-29 NOTE — Telephone Encounter (Signed)
Patient called cancelled CT Scan. He is feeling better and no longer wants it. I have cancelled the order in Epic.

## 2010-09-29 NOTE — Telephone Encounter (Signed)
Thank you - will re schedule if his symptoms return

## 2010-11-06 ENCOUNTER — Other Ambulatory Visit: Payer: Self-pay

## 2010-11-06 MED ORDER — KETOCONAZOLE 2 % EX SHAM: 1.0000 "application " | MEDICATED_SHAMPOO | CUTANEOUS | Status: DC

## 2010-11-06 NOTE — Telephone Encounter (Signed)
[facility] faxed refill request for Ketoconazole 2% shampoo. #13ml x 0 refill. Dr Milinda Antis said OK to refill.

## 2010-12-24 ENCOUNTER — Other Ambulatory Visit: Payer: Self-pay | Admitting: *Deleted

## 2010-12-25 ENCOUNTER — Ambulatory Visit (INDEPENDENT_AMBULATORY_CARE_PROVIDER_SITE_OTHER): Payer: BC Managed Care – PPO | Admitting: Family Medicine

## 2010-12-25 ENCOUNTER — Encounter: Payer: Self-pay | Admitting: Family Medicine

## 2010-12-25 VITALS — BP 124/80 | HR 88 | Temp 98.1°F | Ht 66.5 in | Wt 178.2 lb

## 2010-12-25 DIAGNOSIS — M26609 Unspecified temporomandibular joint disorder, unspecified side: Secondary | ICD-10-CM

## 2010-12-25 HISTORY — DX: Unspecified temporomandibular joint disorder, unspecified side: M26.609

## 2010-12-25 MED ORDER — MELOXICAM 15 MG PO TABS: 15.0000 mg | ORAL_TABLET | Freq: Every day | ORAL | Status: DC

## 2010-12-25 NOTE — Progress Notes (Signed)
Subjective:    Patient ID: Glenn Stewart, male    DOB: 1965-11-27, 45 y.o.   MRN: [id number]  HPI Is having pain in L ear - and a little tender  (is a sharp pain)  Opening his mouth makes it worse , no clicking or dislocating , but hard for him to eat  Is worse at night - ?  Grinds his teeth  No uri symptom  ? Wondering about TMJ Some dull headache on and off - is over whole head , not throbbing (worse if stressed out or talking a lot)  No change in hearing or visiont No fever  Ibuprofen not helping  No tooth pain     Lost 40 lb- very proud of it  Eating salmon and chicken No fried foods No sweets  Is eating vegetables  No exercise so far- but work is somewhat physical  Needs to get on a treadmill when he watches TV   Patient Active Problem List  Diagnoses  . DERMATOPHYTOSIS OF NAIL  . TINEA VERSICOLOR  . HYPERCHOLESTEROLEMIA  . ANXIETY  . DEPRESSION  . HYPERTENSION  . BRONCHITIS, ACUTE  . GERD  . GASTRITIS  . FATTY LIVER DISEASE  . Pain in joint, forearm  . CHEST PAIN  . HYPERGLYCEMIA  . MEDIAL MENISCUS TEAR, LEFT  . ACL TEAR, LEFT KNEE  . PROBLEMS WITH HEARING  . Facial cellulitis  . Routine general medical examination at a health care facility  . Kidney stones  . Hematuria  . TMJ (temporomandibular joint disorder)   Past Medical History  Diagnosis Date  . Tear of medial cartilage or meniscus of knee, current   . Sprain of cruciate ligament of knee   . Pain in joint, forearm   . Unspecified internal derangement of knee   . Problems with hearing   . Other abnormal glucose   . Other abnormal blood chemistry   . Dermatophytosis of nail   . Unspecified gastritis and gastroduodenitis without mention of hemorrhage   . Acute bronchitis   . Urethritis, unspecified   . Prostatitis, unspecified   . Other chronic nonalcoholic liver disease   . Pure hypercholesterolemia   . Unspecified essential hypertension   . Esophageal reflux   . Depressive disorder,  not elsewhere classified   . Anxiety state, unspecified    No past surgical history on file. History  Substance Use Topics  . Smoking status: Never Smoker   . Smokeless tobacco: Not on file  . Alcohol Use: Yes   Family History  Problem Relation Age of Onset  . Diabetes Father   . Hypertension Father    Allergies  Allergen Reactions  . Sulfonamide Derivatives     REACTION: hives   Current Outpatient Prescriptions on File Prior to Visit  Medication Sig Dispense Refill  . amLODipine (NORVASC) 5 MG tablet Take 1 tablet (5 mg total) by mouth daily.  30 tablet  11  . dexmethylphenidate (FOCALIN XR) 15 MG 24 hr capsule Take 30 mg by mouth daily.        . fish oil-omega-3 fatty acids 1000 MG capsule Take 2 g by mouth daily.        . Multiple Vitamin (MULTIVITAMIN) capsule Take 1 capsule by mouth daily.        . sertraline (ZOLOFT) 100 MG tablet Take 100 mg by mouth daily.        . simvastatin (ZOCOR) 20 MG tablet Take 1 tablet (20 mg total) by mouth at bedtime.  30 tablet  11  . ARIPiprazole (ABILIFY) 5 MG tablet Take 5 mg by mouth daily.        Marland Kitchen HYDROcodone-acetaminophen (VICODIN) 5-500 MG per tablet Take 1 tablet by mouth every 6 (six) hours as needed for pain (watch for sedation ).  20 tablet  0  . HYDROcodone-acetaminophen (VICODIN) 5-500 MG per tablet Take 1 tablet by mouth every 4 (four) hours as needed for pain.  20 tablet  0  . ketoconazole (NIZORAL) 2 % shampoo Apply 1 application topically 2 (two) times a week. Let sit 5 minutes and then rinse.  120 mL  0       Review of Systems Review of Systems  Constitutional: Negative for fever, appetite change, fatigue and unexpected weight change.  Eyes: Negative for pain and visual disturbance.  ENT pos for ear and jaw pain/ neg for sinus congestion/ st  Respiratory: Negative for cough and shortness of breath.   Cardiovascular: Negative for cp or palpitations    Gastrointestinal: Negative for nausea, diarrhea and constipation.    Genitourinary: Negative for urgency and frequency.  Skin: Negative for pallor or rash   Neurological: Negative for weakness, light-headedness, numbness and headaches.  Hematological: Negative for adenopathy. Does not bruise/bleed easily.  Psychiatric/Behavioral:depression and anxiety have improved .          Objective:   Physical Exam  Constitutional: He appears well-developed and well-nourished. No distress.       Wt loss noted   HENT:  Head: Normocephalic and atraumatic.  Right Ear: External ear normal.  Left Ear: External ear normal.  Nose: Nose normal.  Mouth/Throat: Oropharynx is clear and moist.       No sinus tenderness Notable tenderness over L TM joint with some clicking  Pt cannot open mouth wide due to pain there  Also painful to move jaw laterally No skin change or swelling noted   Ear canal and TM nl app  Baseline poor hearing  Eyes: Conjunctivae and EOM are normal. Pupils are equal, round, and reactive to light. Right eye exhibits no discharge. Left eye exhibits no discharge.  Neck: Normal range of motion. Neck supple. No JVD present. Carotid bruit is not present. No thyromegaly present.  Cardiovascular: Normal rate, regular rhythm, normal heart sounds and intact distal pulses.  Exam reveals no gallop.   Pulmonary/Chest: Effort normal and breath sounds normal. No respiratory distress. He has no wheezes.  Musculoskeletal: Normal range of motion. He exhibits tenderness. He exhibits no edema.       Tender L TM joint   Lymphadenopathy:    He has no cervical adenopathy.  Neurological: He is alert. He has normal reflexes. No cranial nerve deficit. Coordination normal.  Skin: Skin is warm and dry. No rash noted. No erythema. No pallor.  Psychiatric: He has a normal mood and affect.          Assessment & Plan:

## 2010-12-25 NOTE — Assessment & Plan Note (Signed)
With tenderness over the joint - ref pain to ear- worse with opening mouth Given handout on condition Disc sympt care- mobic/ heat/ ice Also lifestyle modification - straw/ avoid gum/ chewy foods Will see dentist about bite plate or dental splint also

## 2010-12-25 NOTE — Patient Instructions (Addendum)
I think you have TMJ referring pain to the ear  Try to avoid chewy foods and gum Sip all fluids through a straw See the dentist about a possible bite plate to stop grinding teeth at night  Try to talk without opening your mouth very wide Use ice or heat (whichever feels better) when you can  Take mobic as directed (anti inflammatory for pain ) Update me if not improving in 2 weeks

## 2011-04-20 ENCOUNTER — Ambulatory Visit (INDEPENDENT_AMBULATORY_CARE_PROVIDER_SITE_OTHER): Payer: BC Managed Care – PPO | Admitting: Family Medicine

## 2011-04-20 ENCOUNTER — Encounter: Payer: Self-pay | Admitting: Family Medicine

## 2011-04-20 VITALS — BP 136/96 | HR 84 | Temp 98.5°F | Wt 192.5 lb

## 2011-04-20 DIAGNOSIS — Z113 Encounter for screening for infections with a predominantly sexual mode of transmission: Secondary | ICD-10-CM

## 2011-04-20 DIAGNOSIS — J209 Acute bronchitis, unspecified: Secondary | ICD-10-CM

## 2011-04-20 HISTORY — DX: Encounter for screening for infections with a predominantly sexual mode of transmission: Z11.3

## 2011-04-20 MED ORDER — GUAIFENESIN-CODEINE 100-10 MG/5ML PO SYRP: 5.0000 mL | ORAL_SOLUTION | Freq: Every evening | ORAL | Status: AC | PRN

## 2011-04-20 NOTE — Assessment & Plan Note (Addendum)
Anticipate viral bronchitis. Treat supportively as instructions. Update [place] if sxs not improving as expected, consider zpack.

## 2011-04-20 NOTE — Patient Instructions (Signed)
Sounds like you have a viral bronchitis. Antibiotics are not needed for this.  Viral infections usually take 7-10 days to resolve.  The cough can last several weeks to go away. Use medication as prescribed: codeine cough syrup.   Push fluids and plenty of rest. Use simple mucinex with plenty of fluid to mobilize mucous. Please let us know if you are not improving as expected, or if you have high fevers (>101.5) or difficulty swallowing or worsening productive cough. Call clinic with questions.  Good to see you today.

## 2011-04-20 NOTE — Assessment & Plan Note (Signed)
screen performed today.  Urethral swab.

## 2011-04-20 NOTE — Progress Notes (Signed)
  Subjective:    Patient ID: Glenn Stewart, male    DOB: 05/19/1965, 46 y.o.   MRN: [id number]  HPI CC: cough, congestion  46 yo presents with 1 wk h/o ST that progressed to chest congestion.  Also mild head congestion.  Cough productive of thick green sputum.  Mild L ear pain.  Mild pressure HA.  Cough keeping him up at night.  Has been gargling with salt water.  Has not tried anything else for this.  No fevers/chills, abd pain, n/v, tooth pain.  GF smokes at home, GF sick at home, no h/o asthma..  Also would like STD check today - new girlfriend, would like STD screen.  Last was 1-2 yrs ago, told irritated prostate.  Denies discharge, burning.  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  Unsure when last void was.  Review of Systems Per HPI    Objective:   Physical Exam  Nursing note and vitals reviewed. Constitutional: He appears well-developed and well-nourished. No distress.  HENT:  Head: Normocephalic and atraumatic.  Right Ear: Tympanic membrane, external ear and ear canal normal. Decreased hearing is noted.  Left Ear: Tympanic membrane, external ear and ear canal normal. Decreased hearing is noted.  Nose: Nose normal. No mucosal edema or rhinorrhea. Right sinus exhibits no maxillary sinus tenderness and no frontal sinus tenderness. Left sinus exhibits no maxillary sinus tenderness and no frontal sinus tenderness.  Mouth/Throat: Uvula is midline and mucous membranes are normal. Posterior oropharyngeal erythema (mild) present. No oropharyngeal exudate, posterior oropharyngeal edema or tonsillar abscesses.       Dec hearing at baseline  Eyes: Conjunctivae and EOM are normal. Pupils are equal, round, and reactive to light. No scleral icterus.  Neck: Normal range of motion. Neck supple.  Cardiovascular: Normal rate, regular rhythm, normal heart sounds and intact distal pulses.   No murmur heard. Pulmonary/Chest: Effort normal and breath sounds normal. No respiratory  distress. He has no wheezes. He has no rales.  Genitourinary: Testes normal and penis normal. Right testis shows no mass, no swelling and no tenderness. Right testis is descended. Left testis shows no mass, no swelling and no tenderness. Left testis is descended. Circumcised. No phimosis, hypospadias, penile erythema or penile tenderness. No discharge found.  Lymphadenopathy:    He has no cervical adenopathy.  Skin: Skin is warm and dry. No rash noted.       Assessment & Plan:

## 2011-04-21 LAB — GC/CHLAMYDIA PROBE AMP, GENITAL: Chlamydia, DNA Probe: NEGATIVE

## 2011-04-21 LAB — RPR

## 2011-04-21 LAB — HIV ANTIBODY (ROUTINE TESTING W REFLEX): HIV: NONREACTIVE

## 2011-04-26 ENCOUNTER — Telehealth: Payer: Self-pay | Admitting: Family Medicine

## 2011-04-26 MED ORDER — AZITHROMYCIN 250 MG PO TABS: ORAL_TABLET | ORAL | Status: AC

## 2011-04-26 NOTE — Telephone Encounter (Signed)
Patient notified

## 2011-04-26 NOTE — Telephone Encounter (Signed)
Sent in

## 2011-04-26 NOTE — Telephone Encounter (Signed)
Patient was seen last week.  Patient was told to call back,if he wasn't feeling better by Friday.  Patient's still coughing and has congestion.  He was told you would call in an antibiotic,if he wasn't feeling better.  Patient uses [facility].

## 2011-04-27 ENCOUNTER — Telehealth: Payer: Self-pay | Admitting: *Deleted

## 2011-04-27 NOTE — Telephone Encounter (Signed)
Scheduled the patient an appt for 04/28/11 at 12:00n

## 2011-04-27 NOTE — Telephone Encounter (Signed)
Since he cannot get into urology yet and he is in pain - we should see him and check urine / culture if needed -- start to ball rolling if imaging is needed ... If he is having pain that bad , may have an obstructing stone that needs urgent attention Please sched appt with [facility], thanks  (can open up one of my slots tomorrow if needed)

## 2011-04-27 NOTE — Telephone Encounter (Signed)
Patient called stating that he saw you back in July with kidney stones and was given pain medication to carry him over thru the attacks. Patient stated that things were fine for a while and now he is having attacks again. Patient called and scheduled an appointment with Dr. Isabel Caprice, but he can not see him until March 5th. Patient wants to know if you can call him in some more Hyrdrocone/Ace to last him until he can see the doctor. Patient states that he only has 4 pain pills left from July. Please advise.

## 2011-04-27 NOTE — Telephone Encounter (Signed)
Pt called back and Bonita Quin scheduled appt with Dr Milinda Antis and pt is aware of appt.

## 2011-04-28 ENCOUNTER — Encounter: Payer: Self-pay | Admitting: Family Medicine

## 2011-04-28 ENCOUNTER — Ambulatory Visit (INDEPENDENT_AMBULATORY_CARE_PROVIDER_SITE_OTHER): Payer: BC Managed Care – PPO | Admitting: Family Medicine

## 2011-04-28 VITALS — BP 160/94 | HR 88 | Temp 97.9°F | Ht 66.5 in | Wt 194.0 lb

## 2011-04-28 DIAGNOSIS — R109 Unspecified abdominal pain: Secondary | ICD-10-CM

## 2011-04-28 DIAGNOSIS — N200 Calculus of kidney: Secondary | ICD-10-CM

## 2011-04-28 DIAGNOSIS — M549 Dorsalgia, unspecified: Secondary | ICD-10-CM

## 2011-04-28 DIAGNOSIS — R10A2 Flank pain, left side: Secondary | ICD-10-CM

## 2011-04-28 LAB — POCT URINALYSIS DIPSTICK
Bilirubin, UA: NEGATIVE
Ketones, UA: NEGATIVE
Leukocytes, UA: NEGATIVE

## 2011-04-28 LAB — POCT UA - MICROSCOPIC ONLY
RBC, urine, microscopic: 0
WBC, Ur, HPF, POC: 0

## 2011-04-28 MED ORDER — HYDROCODONE-ACETAMINOPHEN 5-500 MG PO TABS: 1.0000 | ORAL_TABLET | Freq: Four times a day (QID) | ORAL | Status: AC | PRN

## 2011-04-28 NOTE — Progress Notes (Signed)
Subjective:    Patient ID: Glenn Stewart, male    DOB: 01-24-1966, 46 y.o.   MRN: [id number]  HPI Left lower back and side pain   Back in July was here for kidney stones - and was given pain med    (very first one he had was 20 years ago- passed a very large one) Needed CT scan  Could not afford it - and thought he passed the stones  Drank acidic drinks and lots of water  Never saw urologist   4 days ago - had pain in L flank - running around to mid back - and rad to testile This is worse at night  On and off in "attacks" -- would take a pain pill - and would go away after several hours  Would be better in am and during the day  Then severe attack 2 days ago -- much more severe pain -- started to go to the ER -- and then within an hour it was gone - do did not stay He called Dr Ellin Goodie office -- the NP can see him this month  A little pain last night   Is working out really hard lately  ? If that is playing a role Cardio/ weights / swimming  Drinking health shakes for breakfast   ua is completely clear today  Has not had a rash   No burning / frequency or burning in urine   Patient Active Problem List  Diagnoses  . DERMATOPHYTOSIS OF NAIL  . TINEA VERSICOLOR  . HYPERCHOLESTEROLEMIA  . ANXIETY  . DEPRESSION  . HYPERTENSION  . BRONCHITIS, ACUTE  . GERD  . GASTRITIS  . FATTY LIVER DISEASE  . Pain in joint, forearm  . CHEST PAIN  . HYPERGLYCEMIA  . MEDIAL MENISCUS TEAR, LEFT  . ACL TEAR, LEFT KNEE  . PROBLEMS WITH HEARING  . Facial cellulitis  . Routine general medical examination at a health care facility  . Kidney stones  . Hematuria  . TMJ (temporomandibular joint disorder)  . Screen for STD (sexually transmitted disease)   Past Medical History  Diagnosis Date  . Tear of medial cartilage or meniscus of knee, current   . Sprain of cruciate ligament of knee   . Pain in joint, forearm   . Unspecified internal derangement of knee   . Problems with hearing    . Other abnormal glucose   . Other abnormal blood chemistry   . Dermatophytosis of nail   . Unspecified gastritis and gastroduodenitis without mention of hemorrhage   . Acute bronchitis   . Urethritis, unspecified   . Prostatitis, unspecified   . Other chronic nonalcoholic liver disease   . Pure hypercholesterolemia   . Unspecified essential hypertension   . Esophageal reflux   . Depressive disorder, not elsewhere classified   . Anxiety state, unspecified    No past surgical history on file. History  Substance Use Topics  . Smoking status: Never Smoker   . Smokeless tobacco: Not on file  . Alcohol Use: Yes   Family History  Problem Relation Age of Onset  . Diabetes Father   . Hypertension Father    Allergies  Allergen Reactions  . Sulfonamide Derivatives     REACTION: hives   Current Outpatient Prescriptions on File Prior to Visit  Medication Sig Dispense Refill  . amLODipine (NORVASC) 5 MG tablet Take 1 tablet (5 mg total) by mouth daily.  30 tablet  11  . azithromycin (ZITHROMAX Z-PAK)  250 MG tablet Two on day 1 followed by one daily for 4 days for total of 5 days, PO  6 each  0  . dexmethylphenidate (FOCALIN XR) 15 MG 24 hr capsule Take 30 mg by mouth daily.        . fish oil-omega-3 fatty acids 1000 MG capsule Take 2 g by mouth daily.        Marland Kitchen glucosamine-chondroitin 500-400 MG tablet Take 1 tablet by mouth daily.      Marland Kitchen guaiFENesin-codeine (ROBITUSSIN AC) 100-10 MG/5ML syrup Take 5 mLs by mouth at bedtime as needed for cough.  180 mL  0  . Multiple Vitamin (MULTIVITAMIN) capsule Take 1 capsule by mouth daily.        . sertraline (ZOLOFT) 100 MG tablet Take 100 mg by mouth daily.        . simvastatin (ZOCOR) 20 MG tablet Take 1 tablet (20 mg total) by mouth at bedtime.  30 tablet  11  . ketoconazole (NIZORAL) 2 % shampoo Apply 1 application topically 2 (two) times a week. Let sit 5 minutes and then rinse.  120 mL  0        Review of Systems Review of Systems    Constitutional: Negative for fever, appetite change, fatigue and unexpected weight change.  Eyes: Negative for pain and visual disturbance.  Respiratory: Negative for cough and shortness of breath.   Cardiovascular: Negative for cp or palpitations    Gastrointestinal: Negative for nausea, diarrhea and constipation. pos for L flank/ abd pain  Genitourinary: Negative for urgency and frequency. pos for pain in L groin when flank pain comes  Skin: Negative for pallor or rash   Neurological: Negative for weakness, light-headedness, numbness and headaches.  Hematological: Negative for adenopathy. Does not bruise/bleed easily.  Psychiatric/Behavioral: Negative for dysphoric mood. The patient is not nervous/anxious.          Objective:   Physical Exam  Constitutional: He appears well-developed and well-nourished. No distress.  HENT:  Head: Normocephalic and atraumatic.  Mouth/Throat: Oropharynx is clear and moist.       HOH and talks very loudly  Eyes: Conjunctivae and EOM are normal. Pupils are equal, round, and reactive to light. No scleral icterus.  Neck: Normal range of motion. Neck supple. No JVD present. No thyromegaly present.  Cardiovascular: Normal rate, regular rhythm and normal heart sounds.  Exam reveals no gallop.   Pulmonary/Chest: Effort normal and breath sounds normal. No respiratory distress. He has no wheezes.  Abdominal: Soft. Bowel sounds are normal. He exhibits no distension and no mass. There is tenderness. There is no rebound and no guarding.       Tender in L flank/ CVA area - very mild  No groin tenderness No suprapubic tenderness  Musculoskeletal: Normal range of motion. He exhibits no edema and no tenderness.  Lymphadenopathy:    He has no cervical adenopathy.  Neurological: He is alert. He has normal reflexes. No cranial nerve deficit. He exhibits normal muscle tone. Coordination normal.  Skin: Skin is warm and dry. No rash noted. No erythema. No pallor.   Psychiatric: His behavior is normal. Judgment and thought content normal.       Speech is pressured and pt talks very loudly Seems at least mildly anxious          Assessment & Plan:

## 2011-04-28 NOTE — Patient Instructions (Signed)
Keep drinking lots of fluids Get the appt with urology for feb 19th  Take med only when needed  If pain gets severe again -- (if after hours - go to the hospital)-otherwise call and let me know - we may need to get imaging before you are seen  If you get a rash - call

## 2011-04-29 DIAGNOSIS — R109 Unspecified abdominal pain: Secondary | ICD-10-CM | POA: Insufficient documentation

## 2011-04-29 NOTE — Assessment & Plan Note (Signed)
Intermittent/ coliky- sounds like kidney stone (see assessment for that) Pain med given/ and appt with urol made ua micro was nl  Cannot afford imaging but will do if symptoms worsen Will screen urine and hydrate well

## 2011-04-29 NOTE — Assessment & Plan Note (Signed)
Pt has hx of kidney stones that he has passed -- no imaging for a long time , however because he cannot afford it  Today - his flank pain / though mild/ hx does sound like stone He has appt with urol later this mo ua today tr blood on dip only with nl micro  His symptoms are not positional  Given pain med Explained that if symptoms worsen before his urol appt we really do need to check a CT - he is in agreement with this  Given px for vicodin 5-500

## 2011-05-03 ENCOUNTER — Ambulatory Visit: Payer: BC Managed Care – PPO | Admitting: Family Medicine

## 2011-05-03 ENCOUNTER — Telehealth: Payer: Self-pay | Admitting: Family Medicine

## 2011-05-03 NOTE — Telephone Encounter (Signed)
Triage Record Num: [id number] Operator: Caswell Corwin Patient Name: Glenn Stewart Call Date & Time: 05/03/2011 9:17:57AM Patient Phone: [phone] PCP: Patient Gender: Male PCP Fax : Patient DOB: 1965-12-17 Practice Name: [facility] Reason for Call: Caller: [name]/Patient; PCP: Roxy Manns A.; CB#: [phone]; ; ; Call regarding Having Issues With Left Lower Stomach Pain, Left Lower Back Pain &. Pain in Testicles. Also States Has Trouble Urinating. Was see by Dr. Milinda Antis last week for possible kidney stones and will see urologist 05/14/11. Will fit in if they have a cancellation. Now is having "spells". Got up to urinate this AM and urine flowwas slow and after her urinated he immedicately have severe U/A pain. Is trying to figure out what to do. Has had no testing done. No pain now. Per Nsg Judegement scheduled an appt with Dr. Dallas Schimke at [phone]. Inst to push fluids and call back if pain restarts. Protocol(s) Used: Urinary Symptoms - Male Recommended Outcome per Protocol: See ED Immediately Reason for Outcome: Sudden onset of pain in testicle(s), groin, or lower abdomen AND testicle(s) swollen or tender to touch Care Advice: ~ 05/03/2011 9:37:57AM Page 1 of 1 [email or web address]

## 2011-05-03 NOTE — Telephone Encounter (Signed)
I do not see his 12:15 visit with Dr Patsy Lager in the chart.. Am I missing it? Was he seen?

## 2011-05-04 ENCOUNTER — Telehealth: Payer: Self-pay | Admitting: Family Medicine

## 2011-05-04 NOTE — Telephone Encounter (Signed)
Triage Record Num: [id number] Operator: Caswell Corwin Patient Name: Glenn Stewart Call Date & Time: 05/03/2011 4:15:44PM Patient Phone: [phone] PCP: Patient Gender: Male PCP Fax : Patient DOB: Dec 14, 1965 Practice Name: [facility] Reason for Call: See HX. Caller: Shloime/Patient; PCP: Tower, Idamae Schuller A.; CB#: [phone]; ; ; Call regarding Patients Blood Pressure Is 156/107 and Has Been Running High and Office Wants To Know If He Can Wait for Appt Tomorrow. Has not taken Focalin x 3 days. He has had a lot of stress. B/P now is 165/105 and 159/101. Triaged HTN/DX/Suspected. Wants to know what is a dangerous range adn he has been taking his B/P med. All emergent Sx R/O. Has had kidney stone pain. Will see the urologist 05/04/11. Home car and call back inst given. Protocol(s) Used: Hypertension, Diagnosed or Suspected Recommended Outcome per Protocol: Provide Home/Self Care Reason for Outcome: Single elevated blood pressure reading and has questions Care Advice: Hypertension is usually diagnosed after 2 elevated blood pressure readings on separate occasions. Now is the time to review your health and decide on changes that will help you to live a healthier life style. These changes may include weight management, exercise, and reduced salt intake. High blood pressure can be controlled by taking all medication that your healthcare provider may prescribe and by following these measures. ~ If you have any side effects from the medication, do not stop it, but call your provider and report the side effects. There are many different medications and types of medication that you can take. Work with your provider to find the one that has the least amount of side effects while controlling your blood pressure. ~ ~ Call your provider, if you have not already done so, to make an appointment for a blood pressure check. 05/03/2011 4:27:44PM Page 1 of 1 [email or web address]

## 2011-05-04 NOTE — Telephone Encounter (Signed)
appt with Dr Patsy Lager was cancelled and cancellation note said pt had made appt with urologist. Unable to reach pt by phone but left message to get update on condition.

## 2011-05-04 NOTE — Telephone Encounter (Signed)
He needs to make appt for the high bp when able if he has not already

## 2011-05-04 NOTE — Telephone Encounter (Signed)
Left vm for pt to callback 

## 2011-05-06 NOTE — Telephone Encounter (Signed)
Left vm for pt to callback 

## 2011-05-11 NOTE — Telephone Encounter (Signed)
Unable to reach pt by phone and I mailed pt a letter to inform him he needs to make appt when able to follow up on high blood pressure.

## 2011-05-17 ENCOUNTER — Encounter: Payer: Self-pay | Admitting: Family Medicine

## 2011-05-17 ENCOUNTER — Ambulatory Visit (INDEPENDENT_AMBULATORY_CARE_PROVIDER_SITE_OTHER): Payer: BC Managed Care – PPO | Admitting: Family Medicine

## 2011-05-17 VITALS — BP 148/86 | HR 92 | Temp 98.0°F | Ht 66.5 in | Wt 196.8 lb

## 2011-05-17 DIAGNOSIS — F411 Generalized anxiety disorder: Secondary | ICD-10-CM

## 2011-05-17 DIAGNOSIS — I10 Essential (primary) hypertension: Secondary | ICD-10-CM

## 2011-05-17 MED ORDER — AMLODIPINE BESYLATE 10 MG PO TABS: 10.0000 mg | ORAL_TABLET | Freq: Every day | ORAL | Status: DC

## 2011-05-17 NOTE — Assessment & Plan Note (Signed)
bp up - no doubt worse with anxiety / paranoia and agitation Per pt even high at rest, however Will see his psychiatrist  Also inc dose of amlodipine to 10 Update if side eff Will continue to monitor at home Handout given F/u 4-6 wk

## 2011-05-17 NOTE — Patient Instructions (Addendum)
Increase your amlodipine (currently 5mg  ) to 10 mg once daily  You can just take 2 of the 5 mg pills - then the new px will be for 10 mg  If any problems or side effects let me know  Follow up with me in 4-6 weeks  Keep checking your bp  Follow up with your psychiatrist - I think your moods affect your bp  Also talk to her about seeing a counselor

## 2011-05-17 NOTE — Progress Notes (Signed)
Subjective:    Patient ID: Glenn Stewart, male    DOB: 11-03-1965, 46 y.o.   MRN: [id number]  HPI Here for f/u of elevated bp  Was up at last visit when he was in pain Then he called with some high readings but the staff was unable to get back in touch with him about this for 1-2 weeks  Worse when upset - but still in 150s when relaxed  As high as systolic 180 when he is upset   Today he is somewhat agitated and wound up  Upset about "everyone bothering me"- says he has lots of bad relationships/ lots of people in debt to him- all causing his problems and trying to drag him down  Very angry today - with obsessive thoughts- and he realizes this     Moods have been bad lately  Too much stress  Goes to psychiatrist - and has appt in march  Relationship problems  ? If mood affects bp   Still taking his regular meds for anx and depression -needs fu with Dr Delle Reining  His kidney stone did pass with flomax -thankful for that  No pain meds     Review of Systems  Review of Systems  Constitutional: Negative for fever, appetite change, fatigue and unexpected weight change.  Eyes: Negative for pain and visual disturbance.  Respiratory: Negative for cough and shortness of breath.   Cardiovascular: Negative for cp or palpitations    Gastrointestinal: Negative for nausea, diarrhea and constipation.  Genitourinary: Negative for urgency and frequency.  Skin: Negative for pallor or rash   Neurological: Negative for weakness, light-headedness, numbness and headaches.  Hematological: Negative for adenopathy. Does not bruise/bleed easily.  Psychiatric/Behavioral: pos for depression and anxiety -- feels sad and tearful and at the same time angry, neg for SI       Patient Active Problem List  Diagnoses  . DERMATOPHYTOSIS OF NAIL  . TINEA VERSICOLOR  . HYPERCHOLESTEROLEMIA  . ANXIETY  . DEPRESSION  . HYPERTENSION  . BRONCHITIS, ACUTE  . GERD  . GASTRITIS  . FATTY LIVER DISEASE  .  Pain in joint, forearm  . CHEST PAIN  . HYPERGLYCEMIA  . MEDIAL MENISCUS TEAR, LEFT  . ACL TEAR, LEFT KNEE  . PROBLEMS WITH HEARING  . Facial cellulitis  . Routine general medical examination at a health care facility  . Kidney stones  . Hematuria  . TMJ (temporomandibular joint disorder)  . Screen for STD (sexually transmitted disease)  . Left flank pain   Past Medical History  Diagnosis Date  . Tear of medial cartilage or meniscus of knee, current   . Sprain of cruciate ligament of knee   . Pain in joint, forearm   . Unspecified internal derangement of knee   . Problems with hearing   . Other abnormal glucose   . Other abnormal blood chemistry   . Dermatophytosis of nail   . Unspecified gastritis and gastroduodenitis without mention of hemorrhage   . Acute bronchitis   . Urethritis, unspecified   . Prostatitis, unspecified   . Other chronic nonalcoholic liver disease   . Pure hypercholesterolemia   . Unspecified essential hypertension   . Esophageal reflux   . Depressive disorder, not elsewhere classified   . Anxiety state, unspecified    No past surgical history on file. History  Substance Use Topics  . Smoking status: Never Smoker   . Smokeless tobacco: Not on file  . Alcohol Use: Yes   Family  History  Problem Relation Age of Onset  . Diabetes Father   . Hypertension Father    Allergies  Allergen Reactions  . Sulfonamide Derivatives     REACTION: hives   Current Outpatient Prescriptions on File Prior to Visit  Medication Sig Dispense Refill  . ALPRAZolam (XANAX) 1 MG tablet Take 1 tablet by mouth Once daily as needed.      Marland Kitchen dexmethylphenidate (FOCALIN XR) 15 MG 24 hr capsule Take 30 mg by mouth daily.        . fish oil-omega-3 fatty acids 1000 MG capsule Take 2 g by mouth daily.        Marland Kitchen glucosamine-chondroitin 500-400 MG tablet Take 1 tablet by mouth daily.      Marland Kitchen guaiFENesin (MUCINEX) 600 MG 12 hr tablet Take 1,200 mg by mouth 2 (two) times daily.        Marland Kitchen ketoconazole (NIZORAL) 2 % shampoo Apply 1 application topically 2 (two) times a week. Let sit 5 minutes and then rinse.  120 mL  0  . Multiple Vitamin (MULTIVITAMIN) capsule Take 1 capsule by mouth daily.        . sertraline (ZOLOFT) 100 MG tablet Take 100 mg by mouth daily.        . simvastatin (ZOCOR) 20 MG tablet Take 1 tablet (20 mg total) by mouth at bedtime.  30 tablet  11       Objective:   Physical Exam  Constitutional: He appears well-developed and well-nourished. He appears distressed.       Emotionally distressed and agitated today  HENT:  Head: Normocephalic and atraumatic.  Mouth/Throat: Oropharynx is clear and moist.  Eyes: Conjunctivae and EOM are normal. Pupils are equal, round, and reactive to light. No scleral icterus.  Neck: Normal range of motion. Neck supple. No JVD present. Carotid bruit is not present. No thyromegaly present.  Cardiovascular: Normal rate, regular rhythm and normal heart sounds.  Exam reveals no gallop.        Rate is 92, pt is agitated emotionally  Pulmonary/Chest: Effort normal and breath sounds normal. No respiratory distress. He has no wheezes.  Abdominal: Soft. Bowel sounds are normal. He exhibits no distension, no abdominal bruit and no mass. There is no tenderness.  Musculoskeletal: Normal range of motion. He exhibits no edema.  Lymphadenopathy:    He has no cervical adenopathy.  Neurological: He is alert. He has normal reflexes. He displays no tremor. No cranial nerve deficit. He exhibits normal muscle tone. Coordination normal.  Skin: Skin is warm and dry. No rash noted. No erythema. No pallor.       Face is a bit flushed   Psychiatric: His mood appears anxious. His affect is angry and labile. His speech is rapid and/or pressured and tangential. His speech is not delayed. He is agitated and aggressive. He is not actively hallucinating. Thought content is paranoid. Cognition and memory are normal. He expresses impulsivity. He exhibits a  depressed mood. He expresses no homicidal and no suicidal ideation.       Anxious and tangential - obsessed with idea that people in his life are toxic He interrupts frequently- is angry and talks very loudly           Assessment & Plan:

## 2011-05-17 NOTE — Assessment & Plan Note (Signed)
This seems much worse with pressured speech/ anger and signs of paranoia Urged him to see psychiatry - Dr Delle Reining asap -he agrees I also wonder if counseling would be of benefit  No doubt this agitation is infl his bp also

## 2011-05-18 NOTE — Progress Notes (Signed)
Office note sent to dr Evelene Croon

## 2011-05-19 ENCOUNTER — Ambulatory Visit: Payer: BC Managed Care – PPO | Admitting: Family Medicine

## 2011-08-10 ENCOUNTER — Other Ambulatory Visit: Payer: Self-pay | Admitting: *Deleted

## 2011-08-10 MED ORDER — SIMVASTATIN 20 MG PO TABS: 20.0000 mg | ORAL_TABLET | Freq: Every day | ORAL | Status: DC

## 2011-08-10 NOTE — Telephone Encounter (Signed)
Received faxed refill request from pharmacy for Simvastatin. Patient has not had lab work in over a year. Is it okay to refill medication?

## 2011-08-10 NOTE — Telephone Encounter (Signed)
Patient advised as instructed via telephone, he stated that he will check his schedule and call back to schedule appt.

## 2011-08-10 NOTE — Telephone Encounter (Signed)
Will refill electronically  Schedule f/u with me (will do labs that day)

## 2011-10-13 ENCOUNTER — Other Ambulatory Visit: Payer: Self-pay

## 2011-10-13 MED ORDER — SIMVASTATIN 20 MG PO TABS: 20.0000 mg | ORAL_TABLET | Freq: Every day | ORAL | Status: DC

## 2011-10-13 NOTE — Telephone Encounter (Signed)
[facility] request simvastatin.# 30 x 0 with note pt needs to call for appt.(see 08/10/11 phone note).

## 2011-11-15 ENCOUNTER — Other Ambulatory Visit: Payer: Self-pay | Admitting: *Deleted

## 2011-11-17 ENCOUNTER — Other Ambulatory Visit: Payer: Self-pay

## 2011-11-17 MED ORDER — SIMVASTATIN 20 MG PO TABS: 20.0000 mg | ORAL_TABLET | Freq: Every day | ORAL | Status: DC

## 2011-12-13 ENCOUNTER — Emergency Department: Payer: Self-pay | Admitting: Emergency Medicine

## 2011-12-14 ENCOUNTER — Other Ambulatory Visit: Payer: Self-pay | Admitting: *Deleted

## 2011-12-15 ENCOUNTER — Other Ambulatory Visit: Payer: Self-pay | Admitting: *Deleted

## 2011-12-15 NOTE — Telephone Encounter (Signed)
Ok to refill? Last OV 05/17/11, no future appt and no recent labs

## 2011-12-16 ENCOUNTER — Encounter: Payer: Self-pay | Admitting: *Deleted

## 2011-12-16 MED ORDER — SIMVASTATIN 20 MG PO TABS: 20.0000 mg | ORAL_TABLET | Freq: Every day | ORAL | Status: DC

## 2011-12-16 NOTE — Telephone Encounter (Signed)
Left voicemail requesting pt to call the office, will try to call back later 

## 2011-12-16 NOTE — Telephone Encounter (Signed)
Scheduled pt a lab appt on 04/10/12 and then a CPE for 04/17/12, then sent in refill request electronically

## 2011-12-16 NOTE — Telephone Encounter (Signed)
Thanks- please call him and set up a PE / labs prior for winter , and refil med until then, thanks

## 2012-04-09 ENCOUNTER — Telehealth: Payer: Self-pay | Admitting: Family Medicine

## 2012-04-09 DIAGNOSIS — K7689 Other specified diseases of liver: Secondary | ICD-10-CM

## 2012-04-09 DIAGNOSIS — Z0000 Encounter for general adult medical examination without abnormal findings: Secondary | ICD-10-CM

## 2012-04-09 DIAGNOSIS — E7800 Pure hypercholesterolemia, unspecified: Secondary | ICD-10-CM

## 2012-04-09 NOTE — Telephone Encounter (Signed)
Message copied by Judy Pimple on Sun Apr 09, 2012  2:21 PM ------      Message from: Liane Comber C      Created: Mon Apr 03, 2012  1:26 PM      Regarding: Cpx labs Mon1/20       Please order  future cpx labs for pt's upcoming lab appt.      Thanks      Rodney Booze

## 2012-04-10 ENCOUNTER — Other Ambulatory Visit (INDEPENDENT_AMBULATORY_CARE_PROVIDER_SITE_OTHER): Payer: BC Managed Care – PPO

## 2012-04-10 DIAGNOSIS — K7689 Other specified diseases of liver: Secondary | ICD-10-CM

## 2012-04-10 DIAGNOSIS — E7800 Pure hypercholesterolemia, unspecified: Secondary | ICD-10-CM

## 2012-04-10 DIAGNOSIS — Z0000 Encounter for general adult medical examination without abnormal findings: Secondary | ICD-10-CM

## 2012-04-10 LAB — COMPREHENSIVE METABOLIC PANEL
ALT: 30 U/L (ref 0–53)
AST: 33 U/L (ref 0–37)
Albumin: 4.2 g/dL (ref 3.5–5.2)
BUN: 20 mg/dL (ref 6–23)
CO2: 26 mEq/L (ref 19–32)
Calcium: 8.6 mg/dL (ref 8.4–10.5)
Chloride: 102 mEq/L (ref 96–112)
GFR: 79.38 mL/min (ref >60.00)
Potassium: 3.7 mEq/L (ref 3.5–5.1)
Sodium: 136 mEq/L (ref 135–145)

## 2012-04-10 LAB — CBC WITH DIFFERENTIAL/PLATELET
Eosinophils Relative: 4.5 % (ref 0.0–5.0)
HCT: 44.6 % (ref 39.0–52.0)
Lymphs Abs: 2.3 10*3/uL (ref 0.7–4.0)
MCV: 83.9 fl (ref 78.0–100.0)
Monocytes Absolute: 0.3 10*3/uL (ref 0.1–1.0)
Platelets: 241 10*3/uL (ref 150.0–400.0)
RDW: 13.1 % (ref 11.5–14.6)
WBC: 5.3 10*3/uL (ref 4.5–10.5)

## 2012-04-10 LAB — TSH: TSH: 1.04 u[IU]/mL (ref 0.35–5.50)

## 2012-04-10 LAB — LDL CHOLESTEROL, DIRECT: Direct LDL: 117.5 mg/dL

## 2012-04-17 ENCOUNTER — Encounter: Payer: BC Managed Care – PPO | Admitting: Family Medicine

## 2012-05-07 ENCOUNTER — Other Ambulatory Visit: Payer: Self-pay | Admitting: Family Medicine

## 2012-05-08 NOTE — Telephone Encounter (Signed)
Call him please to re schedule and refil until then, thanks

## 2012-05-08 NOTE — Telephone Encounter (Signed)
No recent appt and no future appt, no showed CPE last month, ok to refill?

## 2012-05-09 NOTE — Telephone Encounter (Signed)
CPE scheduled and meds refilled  

## 2012-05-16 ENCOUNTER — Ambulatory Visit (INDEPENDENT_AMBULATORY_CARE_PROVIDER_SITE_OTHER): Payer: BC Managed Care – PPO | Admitting: Family Medicine

## 2012-05-16 ENCOUNTER — Encounter: Payer: Self-pay | Admitting: Family Medicine

## 2012-05-16 VITALS — BP 125/80 | HR 93 | Temp 98.5°F | Ht 66.0 in | Wt 200.8 lb

## 2012-05-16 DIAGNOSIS — I10 Essential (primary) hypertension: Secondary | ICD-10-CM

## 2012-05-16 DIAGNOSIS — R7309 Other abnormal glucose: Secondary | ICD-10-CM

## 2012-05-16 DIAGNOSIS — Z0000 Encounter for general adult medical examination without abnormal findings: Secondary | ICD-10-CM

## 2012-05-16 DIAGNOSIS — E7800 Pure hypercholesterolemia, unspecified: Secondary | ICD-10-CM

## 2012-05-16 MED ORDER — AMLODIPINE BESYLATE 10 MG PO TABS: 10.0000 mg | ORAL_TABLET | Freq: Every day | ORAL | Status: DC

## 2012-05-16 MED ORDER — SIMVASTATIN 20 MG PO TABS: 20.0000 mg | ORAL_TABLET | Freq: Every day | ORAL | Status: DC

## 2012-05-16 NOTE — Assessment & Plan Note (Signed)
Glucose fasting 111 and weight fluctuates emph imp of better diet-low glycemic and also exercise He plans to get back to the gym

## 2012-05-16 NOTE — Assessment & Plan Note (Signed)
Reviewed health habits including diet and exercise and skin cancer prevention Also reviewed health mt list, fam hx and immunizations   Reviewed wellness labs in detail  DRE today

## 2012-05-16 NOTE — Patient Instructions (Addendum)
Work on a healthy low fat and low sugar diet For cholesterol ---Avoid red meat/ fried foods/ egg yolks/ fatty breakfast meats/ butter, cheese and high fat dairy/ and shellfish   Get back to the gym- goal exercise 30 minutes 5 days per week  Wear sun protection when you are out in the sun and get good water intake also  Ask Dr Delle Reining about talking to a counselor in the future for stress

## 2012-05-16 NOTE — Assessment & Plan Note (Signed)
This continues to improve with simvastatin -getting closer to goal and would improve further with better diet Disc goals for lipids and reasons to control them Rev labs with pt Rev low sat fat diet in detail

## 2012-05-16 NOTE — Progress Notes (Signed)
Subjective:    Patient ID: Glenn Stewart, male    DOB: [birth year], 47 y.o.   MRN: [id number]  HPI Here for health maintenance exam and to review chronic medical problems   Wt is up 4 lb with bmi of 32 He had previously lost down to 180s- and gained it back  Has aches and pains  Is always really stressed - work/ finances/ relationships/ and mother is staying with him   He has been scuba diving-enjoys it   bp is up today  No cp or palpitations or headaches or edema  No side effects to medicines  BP Readings from Last 3 Encounters:  05/16/12 148/94  05/17/11 148/86  04/28/11 160/94    He did not take his ADD med today   Flu vaccine- does not want one   Depression/anx- sees Dr Delle Reining On new med for bipolar- ? Remember the name - mood stabilizer, has helped a lot  Not seeing a counselor - needs to No alcohol or drugs   Prostate- no problems that he knows of - no nocturia  Stream occ stops and starts  No fam hx of prostate cancer    Lab Results  Component Value Date   CHOL 203* 04/10/2012   CHOL 227* 07/22/2010   CHOL 260* 10/29/2009   Lab Results  Component Value Date   HDL 41.50 04/10/2012   HDL 30.86 07/22/2010   HDL 50.20 10/29/2009   No results found for this basename: LDLCALC   Lab Results  Component Value Date   TRIG 221.0* 04/10/2012   TRIG 416.0 Triglyceride is over 400; calculations on Lipids are invalid.* 07/22/2010   TRIG 299.0* 10/29/2009   Lab Results  Component Value Date   CHOLHDL 5 04/10/2012   CHOLHDL 5 07/22/2010   CHOLHDL 5 10/29/2009   Lab Results  Component Value Date   LDLDIRECT 117.5 04/10/2012   LDLDIRECT 125.9 07/22/2010   LDLDIRECT 168.3 10/29/2009   is watching foods with fats and salts-happy this is improving   Glucose 111- is a junk food junkie on and off (when he is in a rut) Needs to get back to exercise  Patient Active Problem List  Diagnosis  . DERMATOPHYTOSIS OF NAIL  . TINEA VERSICOLOR  . HYPERCHOLESTEROLEMIA  . ANXIETY  .  DEPRESSION  . HYPERTENSION  . GERD  . GASTRITIS  . FATTY LIVER DISEASE  . Pain in joint, forearm  . CHEST PAIN  . HYPERGLYCEMIA  . MEDIAL MENISCUS TEAR, LEFT  . ACL TEAR, LEFT KNEE  . PROBLEMS WITH HEARING  . Routine general medical examination at a health care facility  . Kidney stones  . Hematuria  . TMJ (temporomandibular joint disorder)  . Screen for STD (sexually transmitted disease)   Past Medical History  Diagnosis Date  . Tear of medial cartilage or meniscus of knee, current   . Sprain of cruciate ligament of knee   . Pain in joint, forearm   . Unspecified internal derangement of knee   . Problems with hearing   . Other abnormal glucose   . Other abnormal blood chemistry   . Dermatophytosis of nail   . Unspecified gastritis and gastroduodenitis without mention of hemorrhage   . Acute bronchitis   . Urethritis, unspecified   . Prostatitis, unspecified   . Other chronic nonalcoholic liver disease   . Pure hypercholesterolemia   . Unspecified essential hypertension   . Esophageal reflux   . Depressive disorder, not elsewhere classified   .  Anxiety state, unspecified    No past surgical history on file. History  Substance Use Topics  . Smoking status: Never Smoker   . Smokeless tobacco: Not on file  . Alcohol Use: Yes   Family History  Problem Relation Age of Onset  . Diabetes Father   . Hypertension Father    Allergies  Allergen Reactions  . Sulfonamide Derivatives     REACTION: hives   Current Outpatient Prescriptions on File Prior to Visit  Medication Sig Dispense Refill  . ALPRAZolam (XANAX) 1 MG tablet Take 1 tablet by mouth Once daily as needed.      Marland Kitchen amLODipine (NORVASC) 10 MG tablet TAKE 1 TABLET (10 MG TOTAL) BY MOUTH DAILY.  30 tablet  0  . dexmethylphenidate (FOCALIN XR) 15 MG 24 hr capsule Take 30 mg by mouth daily.        . fish oil-omega-3 fatty acids 1000 MG capsule Take 2 g by mouth daily.        Marland Kitchen glucosamine-chondroitin 500-400 MG  tablet Take 1 tablet by mouth daily.      . Multiple Vitamin (MULTIVITAMIN) capsule Take 1 capsule by mouth daily.        . sertraline (ZOLOFT) 100 MG tablet Take 100 mg by mouth daily.        . simvastatin (ZOCOR) 20 MG tablet Take 1 tablet (20 mg total) by mouth at bedtime.  30 tablet  3   No current facility-administered medications on file prior to visit.    Review of Systems Review of Systems  Constitutional: Negative for fever, appetite change, fatigue and unexpected weight change.  Eyes: Negative for pain and visual disturbance.  Respiratory: Negative for cough and shortness of breath.   Cardiovascular: Negative for cp or palpitations    Gastrointestinal: Negative for nausea, diarrhea and constipation.  Genitourinary: Negative for urgency and frequency.  Skin: Negative for pallor or rash   Neurological: Negative for weakness, light-headedness, numbness and headaches.  Hematological: Negative for adenopathy. Does not bruise/bleed easily.  Psychiatric/Behavioral: pos for depression/ anx and lability of mood that are improving         Objective:   Physical Exam  Constitutional: He appears well-developed and well-nourished. No distress.  HENT:  Head: Normocephalic and atraumatic.  Right Ear: External ear normal.  Left Ear: External ear normal.  Nose: Nose normal.  Mouth/Throat: Oropharynx is clear and moist.  Scant cerumen Hearing impairment noted bilateral  Eyes: Conjunctivae and EOM are normal. Pupils are equal, round, and reactive to light. Right eye exhibits no discharge. Left eye exhibits no discharge. No scleral icterus.  Neck: Normal range of motion. Neck supple. No JVD present. Carotid bruit is not present. No thyromegaly present.  Cardiovascular: Normal rate, regular rhythm, normal heart sounds and intact distal pulses.  Exam reveals no gallop.   Pulmonary/Chest: Effort normal and breath sounds normal. No respiratory distress. He has no wheezes. He exhibits no  tenderness.  Abdominal: Soft. Bowel sounds are normal. He exhibits no distension, no abdominal bruit and no mass. There is no tenderness.  Genitourinary: Rectum normal and prostate normal.  Musculoskeletal: He exhibits no edema and no tenderness.  Lymphadenopathy:    He has no cervical adenopathy.  Neurological: He is alert. He has normal reflexes. No cranial nerve deficit. He exhibits normal muscle tone. Coordination normal.  Skin: Skin is warm and dry. No rash noted. No erythema. No pallor.  Psychiatric: He has a normal mood and affect.  Assessment & Plan:

## 2012-05-16 NOTE — Assessment & Plan Note (Signed)
Better on 2nd check  bp in fair control at this time  No changes needed  Disc lifstyle change with low sodium diet and exercise  Rev labs

## 2013-06-11 ENCOUNTER — Other Ambulatory Visit: Payer: Self-pay | Admitting: Family Medicine

## 2013-06-11 NOTE — Telephone Encounter (Signed)
Electronic refill request, no recent/future appt., please advise  

## 2013-06-11 NOTE — Telephone Encounter (Signed)
Please schedule f/u in late spring and refill until then

## 2013-06-12 NOTE — Telephone Encounter (Signed)
Left voicemail letting pt know he is due for a f/u, and to call our office back and schedule a f/u and once it's schedule we can refill his meds

## 2013-06-12 NOTE — Telephone Encounter (Signed)
Pt left vm requesting refill status.

## 2013-06-13 NOTE — Telephone Encounter (Signed)
Pt left v/m; pt already scheduled appt with Dr Milinda Antisower on 09/05/13; pt is out of med and request cb when refills done.

## 2013-06-13 NOTE — Telephone Encounter (Signed)
Rx's refilled and pt notified.  

## 2013-08-30 ENCOUNTER — Other Ambulatory Visit: Payer: Self-pay | Admitting: Family Medicine

## 2013-08-31 NOTE — Telephone Encounter (Signed)
Please refill times one  

## 2013-08-31 NOTE — Telephone Encounter (Signed)
done

## 2013-08-31 NOTE — Telephone Encounter (Signed)
Electronic refill request, next appt. is on 09/18/13, please advise

## 2013-09-05 ENCOUNTER — Ambulatory Visit: Payer: BC Managed Care – PPO | Admitting: Family Medicine

## 2013-09-11 ENCOUNTER — Other Ambulatory Visit: Payer: Self-pay | Admitting: Family Medicine

## 2013-09-18 ENCOUNTER — Ambulatory Visit (INDEPENDENT_AMBULATORY_CARE_PROVIDER_SITE_OTHER): Payer: BC Managed Care – PPO | Admitting: Family Medicine

## 2013-09-18 ENCOUNTER — Encounter: Payer: Self-pay | Admitting: Family Medicine

## 2013-09-18 ENCOUNTER — Ambulatory Visit: Payer: BC Managed Care – PPO | Admitting: Family Medicine

## 2013-09-18 VITALS — BP 122/88 | HR 69 | Temp 97.8°F | Ht 66.0 in | Wt 198.0 lb

## 2013-09-18 DIAGNOSIS — M545 Low back pain, unspecified: Secondary | ICD-10-CM | POA: Insufficient documentation

## 2013-09-18 DIAGNOSIS — I10 Essential (primary) hypertension: Secondary | ICD-10-CM

## 2013-09-18 DIAGNOSIS — E7800 Pure hypercholesterolemia, unspecified: Secondary | ICD-10-CM

## 2013-09-18 DIAGNOSIS — R7309 Other abnormal glucose: Secondary | ICD-10-CM

## 2013-09-18 HISTORY — DX: Low back pain, unspecified: M54.50

## 2013-09-18 LAB — COMPREHENSIVE METABOLIC PANEL
ALK PHOS: 75 U/L (ref 39–117)
ALT: 32 U/L (ref 0–53)
AST: 31 U/L (ref 0–37)
Albumin: 4.8 g/dL (ref 3.5–5.2)
BILIRUBIN TOTAL: 0.4 mg/dL (ref 0.2–1.2)
BUN: 12 mg/dL (ref 6–23)
CO2: 28 mEq/L (ref 19–32)
Calcium: 9.2 mg/dL (ref 8.4–10.5)
Chloride: 103 mEq/L (ref 96–112)
Creatinine, Ser: 0.8 mg/dL (ref 0.4–1.5)
GFR: 104.63 mL/min (ref >60.00)
Glucose, Bld: 112 mg/dL — ABNORMAL HIGH (ref 70–99)
Potassium: 4.4 mEq/L (ref 3.5–5.1)
Sodium: 137 mEq/L (ref 135–145)
TOTAL PROTEIN: 7.5 g/dL (ref 6.0–8.3)

## 2013-09-18 LAB — CBC WITH DIFFERENTIAL/PLATELET
BASOS PCT: 0.7 % (ref 0.0–3.0)
Basophils Absolute: 0 10*3/uL (ref 0.0–0.1)
EOS PCT: 5.5 % — AB (ref 0.0–5.0)
Eosinophils Absolute: 0.4 10*3/uL (ref 0.0–0.7)
HEMATOCRIT: 46 % (ref 39.0–52.0)
Hemoglobin: 15.6 g/dL (ref 13.0–17.0)
Lymphocytes Relative: 39.1 % (ref 12.0–46.0)
Lymphs Abs: 2.6 10*3/uL (ref 0.7–4.0)
MCHC: 34 g/dL (ref 30.0–36.0)
MCV: 84.7 fl (ref 78.0–100.0)
MONO ABS: 0.3 10*3/uL (ref 0.1–1.0)
Monocytes Relative: 4.9 % (ref 3.0–12.0)
Neutro Abs: 3.3 10*3/uL (ref 1.4–7.7)
Neutrophils Relative %: 49.8 % (ref 43.0–77.0)
PLATELETS: 276 10*3/uL (ref 150.0–400.0)
RBC: 5.43 Mil/uL (ref 4.22–5.81)
RDW: 13.2 % (ref 11.5–15.5)
WBC: 6.6 10*3/uL (ref 4.0–10.5)

## 2013-09-18 LAB — LIPID PANEL
CHOL/HDL RATIO: 4
Cholesterol: 200 mg/dL (ref 0–200)
HDL: 46.7 mg/dL (ref >39.00)
LDL Cholesterol: 90 mg/dL (ref 0–99)
NonHDL: 153.3
Triglycerides: 315 mg/dL — ABNORMAL HIGH (ref 0.0–149.0)
VLDL: 63 mg/dL — AB (ref 0.0–40.0)

## 2013-09-18 LAB — TSH: TSH: 0.73 u[IU]/mL (ref 0.35–4.50)

## 2013-09-18 LAB — HEMOGLOBIN A1C: Hgb A1c MFr Bld: 5.7 % (ref 4.6–6.5)

## 2013-09-18 MED ORDER — AMLODIPINE BESYLATE 10 MG PO TABS: ORAL_TABLET | ORAL | Status: DC

## 2013-09-18 MED ORDER — SIMVASTATIN 20 MG PO TABS: ORAL_TABLET | ORAL | Status: DC

## 2013-09-18 NOTE — Patient Instructions (Signed)
Keep working on healthy diet and exercise Use cold and heat on back intermittently/ stretch and walk frequently  Labs today  Blood pressure is stable

## 2013-09-18 NOTE — Assessment & Plan Note (Signed)
Recurrent lumbosacral strain on the R  No bony tenderness Disc walking/ stretching/ use of nsaid/heat/ice Update if worse or no improvement

## 2013-09-18 NOTE — Assessment & Plan Note (Signed)
A1C today Expect stable to improved with wt loss and better diet

## 2013-09-18 NOTE — Progress Notes (Signed)
Pre visit review using our clinic review tool, if applicable. No additional management support is needed unless otherwise documented below in the visit note. 

## 2013-09-18 NOTE — Progress Notes (Signed)
Subjective:    Patient ID: Glenn Stewart, male    DOB: 05/08/1965, 48 y.o.   MRN: [id number]  HPI Here for f/u of chronic medical problems   Wt is down 3 lb with bmi of 31 (was lower and gained a bit - was down into the 180s)  Uses bike for exercise and bike  Eating healthy - has slipped back a little   Lot of stressors   "a lot on me"- sees psychiatrist  Mood is fair -doing ok overall   Hurt his back recently - and pain goes down his leg   (this happens intermittently) Has been taking aleve/ soaking in tub and elevating his legs   bp is stable today  No cp or palpitations or headaches or edema  No side effects to medicines - amlodipine  BP Readings from Last 3 Encounters:  09/18/13 122/88  05/16/12 125/80  05/17/11 148/86     Cholesterol due for a check  Lab Results  Component Value Date   CHOL 203* 04/10/2012   HDL 41.50 04/10/2012   LDLDIRECT 117.5 04/10/2012   TRIG 221.0* 04/10/2012   CHOLHDL 5 04/10/2012   he thinks this will be stable based on better diet   Patient Active Problem List   Diagnosis Date Noted  . Screen for STD (sexually transmitted disease) 04/20/2011  . TMJ (temporomandibular joint disorder) 12/25/2010  . Kidney stones 09/22/2010  . Routine general medical examination at a health care facility 07/22/2010  . TINEA VERSICOLOR 10/29/2009  . CHEST PAIN 12/04/2008  . MEDIAL MENISCUS TEAR, LEFT 05/31/2008  . ACL TEAR, LEFT KNEE 05/31/2008  . Pain in joint, forearm 05/28/2008  . PROBLEMS WITH HEARING 03/18/2008  . HYPERGLYCEMIA 07/10/2007  . DERMATOPHYTOSIS OF NAIL 04/10/2007  . HYPERCHOLESTEROLEMIA 09/06/2006  . ANXIETY 09/06/2006  . DEPRESSION 09/06/2006  . HYPERTENSION 09/06/2006  . GERD 09/06/2006  . FATTY LIVER DISEASE 09/06/2006   Past Medical History  Diagnosis Date  . Tear of medial cartilage or meniscus of knee, current   . Sprain of cruciate ligament of knee   . Pain in joint, forearm   . Unspecified internal derangement of knee     . Problems with hearing   . Other abnormal glucose   . Other abnormal blood chemistry   . Dermatophytosis of nail   . Unspecified gastritis and gastroduodenitis without mention of hemorrhage   . Acute bronchitis   . Urethritis, unspecified   . Prostatitis, unspecified   . Other chronic nonalcoholic liver disease   . Pure hypercholesterolemia   . Unspecified essential hypertension   . Esophageal reflux   . Depressive disorder, not elsewhere classified   . Anxiety state, unspecified    No past surgical history on file. History  Substance Use Topics  . Smoking status: Never Smoker   . Smokeless tobacco: Not on file  . Alcohol Use: Yes     Comment: rare   Family History  Problem Relation Age of Onset  . Diabetes Father   . Hypertension Father    Allergies  Allergen Reactions  . Sulfonamide Derivatives     REACTION: hives   Current Outpatient Prescriptions on File Prior to Visit  Medication Sig Dispense Refill  . ALPRAZolam (XANAX) 1 MG tablet Take 1 tablet by mouth Once daily as needed.      Marland Kitchen. amLODipine (NORVASC) 10 MG tablet TAKE 1 TABLET (10 MG TOTAL) BY MOUTH DAILY.  90 tablet  0  . dexmethylphenidate (FOCALIN XR) 15 MG  24 hr capsule Take 30 mg by mouth daily.        . fish oil-omega-3 fatty acids 1000 MG capsule Take 2 g by mouth daily.        Marland Kitchen ketoconazole (NIZORAL) 2 % shampoo APPLY AS DIRECTED TWICE A WEEK *LET SIT FOR 5 MINUTES THEN RINSE  120 mL  0  . Multiple Vitamin (MULTIVITAMIN) capsule Take 1 capsule by mouth daily.        . sertraline (ZOLOFT) 100 MG tablet Take 100 mg by mouth daily.        . simvastatin (ZOCOR) 20 MG tablet TAKE 1 TABLET (20 MG TOTAL) BY MOUTH AT BEDTIME.  90 tablet  0   No current facility-administered medications on file prior to visit.     Review of Systems Review of Systems  Constitutional: Negative for fever, appetite change, fatigue and unexpected weight change.  Eyes: Negative for pain and visual disturbance.  Respiratory:  Negative for cough and shortness of breath.   Cardiovascular: Negative for cp or palpitations    Gastrointestinal: Negative for nausea, diarrhea and constipation.  Genitourinary: Negative for urgency and frequency.  Skin: Negative for pallor or rash   MSK pos for low back pain  Neurological: Negative for weakness, light-headedness, numbness and headaches.  Hematological: Negative for adenopathy. Does not bruise/bleed easily.  Psychiatric/Behavioral: pos for depression and anxiety that are controlled          Objective:   Physical Exam  Constitutional: He appears well-developed and well-nourished. No distress.  obese and well appearing   HENT:  Head: Normocephalic and atraumatic.  Mouth/Throat: Oropharynx is clear and moist.  Hard of hearing   Eyes: Conjunctivae and EOM are normal. Pupils are equal, round, and reactive to light. No scleral icterus.  Neck: Normal range of motion. Neck supple. No JVD present. Carotid bruit is not present. No thyromegaly present.  Cardiovascular: Normal rate, regular rhythm and intact distal pulses.  Exam reveals no gallop.   No murmur heard. Pulmonary/Chest: Effort normal and breath sounds normal. No respiratory distress. He has no wheezes. He has no rales.  Abdominal: Soft. Bowel sounds are normal. He exhibits no distension and no mass. There is no tenderness.  Musculoskeletal: He exhibits no edema and no tenderness.  No bony ls tenderness Limited rom   Lymphadenopathy:    He has no cervical adenopathy.  Neurological: He is alert. He has normal reflexes. No cranial nerve deficit. He exhibits normal muscle tone. Coordination normal.  Skin: Skin is warm and dry. No rash noted. No erythema. No pallor.  Psychiatric: His affect is blunt.          Assessment & Plan:   Problem List Items Addressed This Visit     Cardiovascular and Mediastinum   HYPERTENSION - Primary      bp in fair control at this time  BP Readings from Last 1 Encounters:    09/18/13 122/88   No changes needed Disc lifstyle change with low sodium diet and exercise  Lab today    Relevant Medications      amLODIpine (NORVASC) tablet      simvastatin (ZOCOR) tablet   Other Relevant Orders      Comprehensive metabolic panel (Completed)      CBC with Differential (Completed)      TSH (Completed)     Other   HYPERCHOLESTEROLEMIA     Lab today  Rev last lipid Disc goals for lipids and reasons to control them Rev low  sat fat diet in detail     Relevant Medications      amLODIpine (NORVASC) tablet      simvastatin (ZOCOR) tablet   Other Relevant Orders      Lipid panel (Completed)   HYPERGLYCEMIA     A1C today Expect stable to improved with wt loss and better diet     Relevant Orders      Hemoglobin A1c (Completed)   Low back pain     Recurrent lumbosacral strain on the R  No bony tenderness Disc walking/ stretching/ use of nsaid/heat/ice Update if worse or no improvement

## 2013-09-18 NOTE — Assessment & Plan Note (Addendum)
Lab today  Rev last lipid Disc goals for lipids and reasons to control them Rev low sat fat diet in detail

## 2013-09-19 ENCOUNTER — Telehealth: Payer: Self-pay | Admitting: Family Medicine

## 2013-09-19 NOTE — Telephone Encounter (Signed)
Relevant patient education assigned to patient using Emmi. ° °

## 2013-09-20 NOTE — Assessment & Plan Note (Signed)
bp in fair control at this time  BP Readings from Last 1 Encounters:  09/18/13 122/88   No changes needed Disc lifstyle change with low sodium diet and exercise  Lab today

## 2014-01-07 ENCOUNTER — Emergency Department (HOSPITAL_COMMUNITY)
Admission: EM | Admit: 2014-01-07 | Discharge: 2014-01-07 | Disposition: A | Discharge status: Home / Self Care | Payer: BC Managed Care – PPO | Attending: Emergency Medicine | Admitting: Emergency Medicine

## 2014-01-07 ENCOUNTER — Telehealth: Payer: Self-pay

## 2014-01-07 ENCOUNTER — Encounter (HOSPITAL_COMMUNITY): Payer: Self-pay | Admitting: Emergency Medicine

## 2014-01-07 ENCOUNTER — Emergency Department (HOSPITAL_COMMUNITY): Payer: BC Managed Care – PPO

## 2014-01-07 DIAGNOSIS — Z8739 Personal history of other diseases of the musculoskeletal system and connective tissue: Secondary | ICD-10-CM | POA: Diagnosis not present

## 2014-01-07 DIAGNOSIS — Z8619 Personal history of other infectious and parasitic diseases: Secondary | ICD-10-CM | POA: Diagnosis not present

## 2014-01-07 DIAGNOSIS — Z8709 Personal history of other diseases of the respiratory system: Secondary | ICD-10-CM | POA: Diagnosis not present

## 2014-01-07 DIAGNOSIS — H9190 Unspecified hearing loss, unspecified ear: Secondary | ICD-10-CM | POA: Diagnosis not present

## 2014-01-07 DIAGNOSIS — Z87448 Personal history of other diseases of urinary system: Secondary | ICD-10-CM | POA: Insufficient documentation

## 2014-01-07 DIAGNOSIS — K5732 Diverticulitis of large intestine without perforation or abscess without bleeding: Secondary | ICD-10-CM

## 2014-01-07 DIAGNOSIS — E780 Pure hypercholesterolemia: Secondary | ICD-10-CM | POA: Insufficient documentation

## 2014-01-07 DIAGNOSIS — F319 Bipolar disorder, unspecified: Secondary | ICD-10-CM | POA: Diagnosis not present

## 2014-01-07 DIAGNOSIS — Z79899 Other long term (current) drug therapy: Secondary | ICD-10-CM | POA: Insufficient documentation

## 2014-01-07 DIAGNOSIS — F909 Attention-deficit hyperactivity disorder, unspecified type: Secondary | ICD-10-CM | POA: Insufficient documentation

## 2014-01-07 DIAGNOSIS — I10 Essential (primary) hypertension: Secondary | ICD-10-CM | POA: Insufficient documentation

## 2014-01-07 DIAGNOSIS — Z87828 Personal history of other (healed) physical injury and trauma: Secondary | ICD-10-CM | POA: Insufficient documentation

## 2014-01-07 DIAGNOSIS — R1032 Left lower quadrant pain: Secondary | ICD-10-CM | POA: Diagnosis present

## 2014-01-07 DIAGNOSIS — K5730 Diverticulosis of large intestine without perforation or abscess without bleeding: Secondary | ICD-10-CM | POA: Diagnosis not present

## 2014-01-07 HISTORY — DX: Attention-deficit hyperactivity disorder, unspecified type: F90.9

## 2014-01-07 HISTORY — DX: Bipolar disorder, unspecified: F31.9

## 2014-01-07 LAB — CBC WITH DIFFERENTIAL/PLATELET
Basophils Absolute: 0 10*3/uL (ref 0.0–0.1)
Basophils Relative: 0 % (ref 0–1)
Eosinophils Absolute: 0.1 10*3/uL (ref 0.0–0.7)
Eosinophils Relative: 1 % (ref 0–5)
HEMATOCRIT: 46.6 % (ref 39.0–52.0)
Hemoglobin: 15.8 g/dL (ref 13.0–17.0)
LYMPHS ABS: 1.4 10*3/uL (ref 0.7–4.0)
LYMPHS PCT: 12 % (ref 12–46)
MCH: 28.6 pg (ref 26.0–34.0)
MCHC: 33.9 g/dL (ref 30.0–36.0)
MCV: 84.4 fL (ref 78.0–100.0)
Monocytes Absolute: 1 10*3/uL (ref 0.1–1.0)
Monocytes Relative: 9 % (ref 3–12)
NEUTROS ABS: 9.3 10*3/uL — AB (ref 1.7–7.7)
Neutrophils Relative %: 78 % — ABNORMAL HIGH (ref 43–77)
PLATELETS: 200 10*3/uL (ref 150–400)
RBC: 5.52 MIL/uL (ref 4.22–5.81)
RDW: 12.8 % (ref 11.5–15.5)
WBC: 11.8 10*3/uL — ABNORMAL HIGH (ref 4.0–10.5)

## 2014-01-07 LAB — COMPREHENSIVE METABOLIC PANEL
ALT: 33 U/L (ref 0–53)
AST: 26 U/L (ref 0–37)
Albumin: 4.1 g/dL (ref 3.5–5.2)
Alkaline Phosphatase: 75 U/L (ref 39–117)
Anion gap: 14 (ref 5–15)
BILIRUBIN TOTAL: 0.7 mg/dL (ref 0.3–1.2)
BUN: 15 mg/dL (ref 6–23)
CO2: 25 meq/L (ref 19–32)
Calcium: 9.3 mg/dL (ref 8.4–10.5)
Chloride: 99 mEq/L (ref 96–112)
Creatinine, Ser: 0.88 mg/dL (ref 0.50–1.35)
GLUCOSE: 120 mg/dL — AB (ref 70–99)
Potassium: 4.1 mEq/L (ref 3.7–5.3)
Sodium: 138 mEq/L (ref 137–147)
Total Protein: 7.7 g/dL (ref 6.0–8.3)

## 2014-01-07 LAB — URINALYSIS, ROUTINE W REFLEX MICROSCOPIC
BILIRUBIN URINE: NEGATIVE
GLUCOSE, UA: NEGATIVE mg/dL
Hgb urine dipstick: NEGATIVE
KETONES UR: NEGATIVE mg/dL
LEUKOCYTES UA: NEGATIVE
Nitrite: NEGATIVE
Protein, ur: NEGATIVE mg/dL
Specific Gravity, Urine: 1.027 (ref 1.005–1.030)
Urobilinogen, UA: 0.2 mg/dL (ref 0.0–1.0)
pH: 5.5 (ref 5.0–8.0)

## 2014-01-07 LAB — LIPASE, BLOOD: Lipase: 43 U/L (ref 11–59)

## 2014-01-07 MED ORDER — SODIUM CHLORIDE 0.9 % IV SOLN
1000.0000 mL | Freq: Once | INTRAVENOUS | Status: AC
  Administered 2014-01-07: 1000 mL via INTRAVENOUS

## 2014-01-07 MED ORDER — ONDANSETRON HCL 4 MG/2ML IJ SOLN
4.0000 mg | Freq: Once | INTRAMUSCULAR | Status: AC
  Administered 2014-01-07: 4 mg via INTRAVENOUS
  Filled 2014-01-07: qty 2

## 2014-01-07 MED ORDER — TRAMADOL HCL 50 MG PO TABS: 50.0000 mg | ORAL_TABLET | Freq: Three times a day (TID) | ORAL | Status: DC | PRN

## 2014-01-07 MED ORDER — CIPROFLOXACIN HCL 500 MG PO TABS: 500.0000 mg | ORAL_TABLET | Freq: Two times a day (BID) | ORAL | Status: DC

## 2014-01-07 MED ORDER — OXYCODONE-ACETAMINOPHEN 5-325 MG PO TABS: 1.0000 | ORAL_TABLET | ORAL | Status: DC | PRN

## 2014-01-07 MED ORDER — CIPROFLOXACIN HCL 500 MG PO TABS
500.0000 mg | ORAL_TABLET | Freq: Once | ORAL | Status: AC
  Administered 2014-01-07: 500 mg via ORAL
  Filled 2014-01-07: qty 1

## 2014-01-07 MED ORDER — MORPHINE SULFATE 4 MG/ML IJ SOLN
4.0000 mg | Freq: Once | INTRAMUSCULAR | Status: AC
  Administered 2014-01-07: 4 mg via INTRAVENOUS
  Filled 2014-01-07: qty 1

## 2014-01-07 MED ORDER — SODIUM CHLORIDE 0.9 % IV SOLN: 1000.0000 mL | INTRAVENOUS | Status: DC

## 2014-01-07 MED ORDER — METRONIDAZOLE 500 MG PO TABS: 500.0000 mg | ORAL_TABLET | Freq: Three times a day (TID) | ORAL | Status: DC

## 2014-01-07 MED ORDER — IOHEXOL 300 MG/ML  SOLN
100.0000 mL | Freq: Once | INTRAMUSCULAR | Status: AC | PRN
  Administered 2014-01-07: 100 mL via INTRAVENOUS

## 2014-01-07 MED ORDER — IOHEXOL 300 MG/ML  SOLN
25.0000 mL | Freq: Once | INTRAMUSCULAR | Status: AC | PRN
  Administered 2014-01-07: 25 mL via ORAL

## 2014-01-07 MED ORDER — METRONIDAZOLE 500 MG PO TABS
500.0000 mg | ORAL_TABLET | Freq: Once | ORAL | Status: AC
  Administered 2014-01-07: 500 mg via ORAL
  Filled 2014-01-07: qty 1

## 2014-01-07 NOTE — Telephone Encounter (Signed)
Try the tramadol instead of the percocet-do not mix them Please call it in  Aleve may upset stomach-be careful of this  The risk/ worry if his pain does not improve soon is this- diverticulitis can evolve into an abscess with a perforation -this is a surgical emergency --so if pain worsens tonight or does not improve tomorrow he really does need to alert us and get to the ER  We do not want to mask pain to the point where an emergency could be missed

## 2014-01-07 NOTE — Telephone Encounter (Signed)
Pt notified of Dr. Royden Purlower's comments/recommendations pt said he isn't going back to the hospital due to cost. Pt said he has never tried Tramadol but is willing to try anything because he can't walk without being in bad pain. Pt said if he can't use tramadol if Aleve okay to take. Can pt take the tramadol with the percocet, please advise  [facility] [place]

## 2014-01-07 NOTE — Telephone Encounter (Signed)
Pt left v/m; pt was seen [facility] ED on 01/06/14 with dx diverticulitis of large intestine and was given antibiotic and percocet 5-325 mg.percocet is not helping pain; pt wants to know if can get different pain med until seen for f/u ED visit with Dr Milinda Antisower on 01/09/14. If pt is sitting pain not too bad but if moves around at all pain is much worse.pt request cb ASAP.

## 2014-01-07 NOTE — Telephone Encounter (Signed)
Called pt twice and no answer and no voicemail box sent up, Rx called in as prescribed and advise pharmacy that if pt picks up Rx to have him stop the percocet and do not mix with tramadol

## 2014-01-07 NOTE — ED Provider Notes (Signed)
CSN: [id number]     Arrival date & time 01/07/14  0230 History   First MD Initiated Contact with Patient 01/07/14 [phone]     Chief Complaint  Patient presents with  . Abdominal Pain     (Consider location/radiation/quality/duration/timing/severity/associated sxs/prior Treatment) Patient is a 48 y.o. male presenting with abdominal pain. The history is provided by the patient.  Abdominal Pain He has been having pain in his left lower abdomen for the last 2 days. Symptoms started 2 days ago after eating a hamburger restaurant. On that, he stated that he just didn't feel well. He started having abdominal pain yesterday and it is a well localized to the left lower abdomen. It is worse with movement. When he took the hospital, if the car hit a bump it did make the pain worse. He denies nausea or vomiting except he had nausea when he had blood drawn after arrival in the ED. He states that he has not had diarrhea or constipation, but he has had a change in his bowel habits in that he normally has multiple bowel movements when he wakes up and does not require straining to move his bowels and yesterday and today he has had fewer bowel movements than normal and has had to strain mildly. He denies urinary urgency, frequency, tenesmus, dysuria. Pain is rated at 8/10.  Past Medical History  Diagnosis Date  . Tear of medial cartilage or meniscus of knee, current   . Sprain of cruciate ligament of knee   . Pain in joint, forearm   . Unspecified internal derangement of knee   . Problems with hearing   . Other abnormal glucose   . Other abnormal blood chemistry   . Dermatophytosis of nail   . Unspecified gastritis and gastroduodenitis without mention of hemorrhage   . Acute bronchitis   . Urethritis, unspecified   . Prostatitis, unspecified   . Other chronic nonalcoholic liver disease   . Pure hypercholesterolemia   . Unspecified essential hypertension   . Esophageal reflux   . Depressive disorder, not  elsewhere classified   . Anxiety state, unspecified   . High cholesterol   . Bipolar 1 disorder   . ADHD (attention deficit hyperactivity disorder)    History reviewed. No pertinent past surgical history. Family History  Problem Relation Age of Onset  . Diabetes Father   . Hypertension Father    History  Substance Use Topics  . Smoking status: Never Smoker   . Smokeless tobacco: Not on file  . Alcohol Use: Yes     Comment: rare    Review of Systems  Gastrointestinal: Positive for abdominal pain.  All other systems reviewed and are negative.     Allergies  Sulfonamide derivatives  Home Medications   Prior to Admission medications   Medication Sig Start Date End Date Taking? Authorizing Provider  ALPRAZolam Prudy Feeler(XANAX) 1 MG tablet Take 1 tablet by mouth Once daily as needed for anxiety.  04/17/11  Yes Historical Provider, MD  amLODipine (NORVASC) 10 MG tablet Take 10 mg by mouth daily.   Yes Historical Provider, MD  Carbamazepine (EQUETRO) 100 MG CP12 12 hr capsule Take 100 mg by mouth at bedtime.   Yes Historical Provider, MD  dexmethylphenidate (FOCALIN XR) 15 MG 24 hr capsule Take 30 mg by mouth every morning.    Yes Historical Provider, MD  fish oil-omega-3 fatty acids 1000 MG capsule Take 2 g by mouth daily.     Yes Historical Provider, MD  Multiple  Vitamin (MULTIVITAMIN) capsule Take 1 capsule by mouth daily.     Yes Historical Provider, MD  sertraline (ZOLOFT) 100 MG tablet Take 100 mg by mouth daily.     Yes Historical Provider, MD  simvastatin (ZOCOR) 20 MG tablet Take 20 mg by mouth daily.   Yes Historical Provider, MD   BP 116/75  Pulse 85  Temp(Src) 98.4 F (36.9 C) (Oral)  Resp 16  Ht 5\' 6"  (1.676 m)  Wt 195 lb (88.451 kg)  BMI 31.49 kg/m2  SpO2 97% Physical Exam  Nursing note and vitals reviewed.  48 year old male, resting comfortably and in no acute distress. Vital signs are normal. Oxygen saturation is 97%, which is normal. Head is normocephalic and  atraumatic. PERRLA, EOMI. Oropharynx is clear. Neck is nontender and supple without adenopathy or JVD. Back is nontender and there is no CVA tenderness. Lungs are clear without rales, wheezes, or rhonchi. Chest is nontender. Heart has regular rate and rhythm without murmur. Abdomen is soft, flat, with the periumbilical and left lower quadrant tenderness. Palpation of the right lower quadrant causes referred pain to the left lower quadrant. There is no rebound or guarding. There are no masses or hepatosplenomegaly and peristalsis is normoactive. Extremities have no cyanosis or edema, full range of motion is present. Skin is warm and dry without rash. Neurologic: Mental status is normal, cranial nerves are intact, there are no motor or sensory deficits.  ED Course  Procedures (including critical care time) Labs Review Results for orders placed during the hospital encounter of 01/07/14  CBC WITH DIFFERENTIAL      Result Value Ref Range   WBC 11.8 (*) 4.0 - 10.5 K/uL   RBC 5.52  4.22 - 5.81 MIL/uL   Hemoglobin 15.8  13.0 - 17.0 g/dL   HCT 16.146.6  09.639.0 - 04.552.0 %   MCV 84.4  78.0 - 100.0 fL   MCH 28.6  26.0 - 34.0 pg   MCHC 33.9  30.0 - 36.0 g/dL   RDW 40.912.8  81.111.5 - 91.415.5 %   Platelets 200  150 - 400 K/uL   Neutrophils Relative % 78 (*) 43 - 77 %   Neutro Abs 9.3 (*) 1.7 - 7.7 K/uL   Lymphocytes Relative 12  12 - 46 %   Lymphs Abs 1.4  0.7 - 4.0 K/uL   Monocytes Relative 9  3 - 12 %   Monocytes Absolute 1.0  0.1 - 1.0 K/uL   Eosinophils Relative 1  0 - 5 %   Eosinophils Absolute 0.1  0.0 - 0.7 K/uL   Basophils Relative 0  0 - 1 %   Basophils Absolute 0.0  0.0 - 0.1 K/uL  COMPREHENSIVE METABOLIC PANEL      Result Value Ref Range   Sodium 138  137 - 147 mEq/L   Potassium 4.1  3.7 - 5.3 mEq/L   Chloride 99  96 - 112 mEq/L   CO2 25  19 - 32 mEq/L   Glucose, Bld 120 (*) 70 - 99 mg/dL   BUN 15  6 - 23 mg/dL   Creatinine, Ser 7.820.88  0.50 - 1.35 mg/dL   Calcium 9.3  8.4 - 95.610.5 mg/dL   Total  Protein 7.7  6.0 - 8.3 g/dL   Albumin 4.1  3.5 - 5.2 g/dL   AST 26  0 - 37 U/L   ALT 33  0 - 53 U/L   Alkaline Phosphatase 75  39 - 117 U/L  Total Bilirubin 0.7  0.3 - 1.2 mg/dL   GFR calc non Af Amer >90  >90 mL/min   GFR calc Af Amer >90  >90 mL/min   Anion gap 14  5 - 15  LIPASE, BLOOD      Result Value Ref Range   Lipase 43  11 - 59 U/L  URINALYSIS, ROUTINE W REFLEX MICROSCOPIC      Result Value Ref Range   Color, Urine YELLOW  YELLOW   APPearance CLEAR  CLEAR   Specific Gravity, Urine 1.027  1.005 - 1.030   pH 5.5  5.0 - 8.0   Glucose, UA NEGATIVE  NEGATIVE mg/dL   Hgb urine dipstick NEGATIVE  NEGATIVE   Bilirubin Urine NEGATIVE  NEGATIVE   Ketones, ur NEGATIVE  NEGATIVE mg/dL   Protein, ur NEGATIVE  NEGATIVE mg/dL   Urobilinogen, UA 0.2  0.0 - 1.0 mg/dL   Nitrite NEGATIVE  NEGATIVE   Leukocytes, UA NEGATIVE  NEGATIVE   Imaging Review Ct Abdomen Pelvis W Contrast  01/07/2014   CLINICAL DATA:  LEFT-sided abdominal pain, swelling after eating a hamburger a 1-1/2 days ago.  EXAM: CT ABDOMEN AND PELVIS WITH CONTRAST  TECHNIQUE: Multidetector CT imaging of the abdomen and pelvis was performed using the standard protocol following bolus administration of intravenous contrast.  CONTRAST:  OMNIPAQUE IOHEXOL 300 MG/ML  SOLN  COMPARISON:  CT of the abdomen pelvis May 04, 2011.  FINDINGS: LUNG BASES: Included view of the lung bases demonstrate RIGHT lung base atelectasis. Heart size is normal; Coronary artery calcification versus stents. No pericardial fluid collections.  SOLID ORGANS: The liver, spleen, gallbladder, pancreas and RIGHT adrenal gland are unremarkable. Thickened LEFT adrenal gland with superimposed 15 mm LEFT adrenal nodule, 7 Hounsfield units on delayed imaging most consistent with benign adenoma.  GASTROINTESTINAL TRACT: The stomach, small and large bowel are normal in course and caliber. Descending colonic diverticulosis with diverticulosis with an approximate 11  cm segment of inflammation, trace free fluid and thickened peritoneum. The appendix is not discretely identified, however there are no inflammatory changes in the right lower quadrant.  KIDNEYS/ URINARY TRACT: Kidneys are orthotopic, demonstrating symmetric enhancement. No nephrolithiasis, hydronephrosis or solid renal masses. The unopacified ureters are normal in course and caliber. Delayed imaging through the kidneys demonstrates symmetric prompt contrast excretion within the proximal urinary collecting system. Urinary bladder is decompressed and unremarkable.  PERITONEUM/RETROPERITONEUM: No intraperitoneal free air. Aortoiliac vessels are normal in course and caliber, mild calcific atherosclerosis. No lymphadenopathy by CT size criteria. Internal reproductive organs are unremarkable.  SOFT TISSUE/OSSEOUS STRUCTURES: Nonsuspicious. Small fat containing inguinal hernias. Moderate to severe L4-5 and L5-S1 disc degeneration resulting in moderate to severe neural foraminal narrowing at these levels.  IMPRESSION: Acute descending colonic uncomplicated diverticulitis.  15 mm LEFT adrenal adenoma.   Electronically Signed   By: Awilda Metro   On: 01/07/2014 07:00   Images viewed by me.  MDM   Final diagnoses:  Diverticulitis of colon without hemorrhage    Left lower quadrant pain of uncertain cause. Suspect diverticulitis. He will be sent for CT of abdomen and pelvis.  CT confirms diverticulitis. He has achieved good pain relief with dose of morphine. He is discharged with prescriptions for ciprofloxacin, metronidazole, and oxycodone-acetaminophen. Followup with PCP for recheck in 2 days.  Dione Booze, MD 01/07/14 [phone]

## 2014-01-07 NOTE — Telephone Encounter (Signed)
I reviewed his note from the ED and his CT scan (no abscess) I would expect his pain to start improving soon with the antibiotics -but if no further improvement by tomorrow he should go back to the ER (? May need IV abx and re check CT scan) I cannot px any narcotic stronger than percocet unfortunately  ? Has he tried tramadol in the past -it is a little different

## 2014-01-07 NOTE — ED Notes (Signed)
C/o feeling bad after eating a hamburger from a restaurant around 1pm on Saturday.  C/o L side of abd feeling swollen with sharp stabbing abd pain since Sunday around noon.  Pain worse with movement.  Denies nausea, vomiting, diarrhea, and urinary complaint.

## 2014-01-07 NOTE — Discharge Instructions (Signed)
Diverticulitis Diverticulitis is inflammation or infection of small pouches in your colon that form when you have a condition called diverticulosis. The pouches in your colon are called diverticula. Your colon, or large intestine, is where water is absorbed and stool is formed. Complications of diverticulitis can include:  Bleeding.  Severe infection.  Severe pain.  Perforation of your colon.  Obstruction of your colon. CAUSES  Diverticulitis is caused by bacteria. Diverticulitis happens when stool becomes trapped in diverticula. This allows bacteria to grow in the diverticula, which can lead to inflammation and infection. RISK FACTORS People with diverticulosis are at risk for diverticulitis. Eating a diet that does not include enough fiber from fruits and vegetables may make diverticulitis more likely to develop. SYMPTOMS  Symptoms of diverticulitis may include:  Abdominal pain and tenderness. The pain is normally located on the left side of the abdomen, but may occur in other areas.  Fever and chills.  Bloating.  Cramping.  Nausea.  Vomiting.  Constipation.  Diarrhea.  Blood in your stool. DIAGNOSIS  Your health care provider will ask you about your medical history and do a physical exam. You may need to have tests done because many medical conditions can cause the same symptoms as diverticulitis. Tests may include:  Blood tests.  Urine tests.  Imaging tests of the abdomen, including X-rays and CT scans. When your condition is under control, your health care provider may recommend that you have a colonoscopy. A colonoscopy can show how severe your diverticula are and whether something else is causing your symptoms. TREATMENT  Most cases of diverticulitis are mild and can be treated at home. Treatment may include:  Taking over-the-counter pain medicines.  Following a clear liquid diet.  Taking antibiotic medicines by mouth for 7-10 days. More severe cases may  be treated at a hospital. Treatment may include:  Not eating or drinking.  Taking prescription pain medicine.  Receiving antibiotic medicines through an IV tube.  Receiving fluids and nutrition through an IV tube.  Surgery. HOME CARE INSTRUCTIONS   Follow your health care provider's instructions carefully.  Follow a full liquid diet or other diet as directed by your health care provider. After your symptoms improve, your health care provider may tell you to change your diet. He or she may recommend you eat a high-fiber diet. Fruits and vegetables are good sources of fiber. Fiber makes it easier to pass stool.  Take fiber supplements or probiotics as directed by your health care provider.  Only take medicines as directed by your health care provider.  Keep all your follow-up appointments. SEEK MEDICAL CARE IF:   Your pain does not improve.  You have a hard time eating food.  Your bowel movements do not return to normal. SEEK IMMEDIATE MEDICAL CARE IF:   Your pain becomes worse.  Your symptoms do not get better.  Your symptoms suddenly get worse.  You have a fever.  You have repeated vomiting.  You have bloody or black, tarry stools. MAKE SURE YOU:   Understand these instructions.  Will watch your condition.  Will get help right away if you are not doing well or get worse. Document Released: 12/16/2004 Document Revised: 03/13/2013 Document Reviewed: 01/31/2013 [facility] Patient Information 2015 [place], [place]. This information is not intended to replace advice given to you by your health care provider. Make sure you discuss any questions you have with your health care provider.  Ciprofloxacin tablets What is this medicine? CIPROFLOXACIN (sip roe FLOX a sin) is  a quinolone antibiotic. It is used to treat certain kinds of bacterial infections. It will not work for colds, flu, or other viral infections. This medicine may be used for other purposes; ask your health  care provider or pharmacist if you have questions. COMMON BRAND NAME(S): Cipro What should I tell my health care provider before I take this medicine? They need to know if you have any of these conditions: -bone problems -cerebral disease -joint problems -irregular heartbeat -kidney disease -liver disease -myasthenia gravis -seizure disorder -tendon problems -an unusual or allergic reaction to ciprofloxacin, other antibiotics or medicines, foods, dyes, or preservatives -pregnant or trying to get pregnant -breast-feeding How should I use this medicine? Take this medicine by mouth with a glass of water. Follow the directions on the prescription label. Take your medicine at regular intervals. Do not take your medicine more often than directed. Take all of your medicine as directed even if you think your are better. Do not skip doses or stop your medicine early. You can take this medicine with food or on an empty stomach. It can be taken with a meal that contains dairy or calcium, but do not take it alone with a dairy product, like milk or yogurt or calcium-fortified juice. A special MedGuide will be given to you by the pharmacist with each prescription and refill. Be sure to read this information carefully each time. Talk to your pediatrician regarding the use of this medicine in children. Special care may be needed. Overdosage: If you think you have taken too much of this medicine contact a poison control center or emergency room at once. NOTE: This medicine is only for you. Do not share this medicine with others. What if I miss a dose? If you miss a dose, take it as soon as you can. If it is almost time for your next dose, take only that dose. Do not take double or extra doses. What may interact with this medicine? Do not take this medicine with any of the following medications: -cisapride -droperidol -terfenadine -tizanidine This medicine may also interact with the following  medications: -antacids -birth control pills -caffeine -cyclosporin -didanosine (ddI) buffered tablets or powder -medicines for diabetes -medicines for inflammation like ibuprofen, naproxen -methotrexate -multivitamins -omeprazole -phenytoin -probenecid -sucralfate -theophylline -warfarin This list may not describe all possible interactions. Give your health care provider a list of all the medicines, herbs, non-prescription drugs, or dietary supplements you use. Also tell them if you smoke, drink alcohol, or use illegal drugs. Some items may interact with your medicine. What should I watch for while using this medicine? Tell your doctor or health care professional if your symptoms do not improve. Do not treat diarrhea with over the counter products. Contact your doctor if you have diarrhea that lasts more than 2 days or if it is severe and watery. You may get drowsy or dizzy. Do not drive, use machinery, or do anything that needs mental alertness until you know how this medicine affects you. Do not stand or sit up quickly, especially if you are an older patient. This reduces the risk of dizzy or fainting spells. This medicine can make you more sensitive to the sun. Keep out of the sun. If you cannot avoid being in the sun, wear protective clothing and use sunscreen. Do not use sun lamps or tanning beds/booths. Avoid antacids, aluminum, calcium, iron, magnesium, and zinc products for 6 hours before and 2 hours after taking a dose of this medicine. What side effects may  I notice from receiving this medicine? Side effects that you should report to your doctor or health care professional as soon as possible: - allergic reactions like skin rash, itching or hives, swelling of the face, lips, or tongue - breathing problems - confusion, nightmares or hallucinations - feeling faint or lightheaded, falls - irregular heartbeat - joint, muscle or tendon pain or swelling - pain or trouble  passing urine -persistent headache with or without blurred vision - redness, blistering, peeling or loosening of the skin, including inside the mouth - seizure - unusual pain, numbness, tingling, or weakness Side effects that usually do not require medical attention (report to your doctor or health care professional if they continue or are bothersome): - diarrhea - nausea or stomach upset - white patches or sores in the mouth This list may not describe all possible side effects. Call your doctor for medical advice about side effects. You may report side effects to FDA at 1-800-FDA-1088. Where should I keep my medicine? Keep out of the reach of children. Store at room temperature below 30 degrees C (86 degrees F). Keep container tightly closed. Throw away any unused medicine after the expiration date. NOTE: This sheet is a summary. It may not cover all possible information. If you have questions about this medicine, talk to your doctor, pharmacist, or health care provider.  2015, Elsevier/Gold Standard. (2012-10-12 16:10:46)  Metronidazole tablets or capsules What is this medicine? METRONIDAZOLE (me troe NI da zole) is an antiinfective. It is used to treat certain kinds of bacterial and protozoal infections. It will not work for colds, flu, or other viral infections. This medicine may be used for other purposes; ask your health care provider or pharmacist if you have questions. COMMON BRAND NAME(S): Flagyl What should I tell my health care provider before I take this medicine? They need to know if you have any of these conditions: -anemia or other blood disorders -disease of the nervous system -fungal or yeast infection -if you drink alcohol containing drinks -liver disease -seizures -an unusual or allergic reaction to metronidazole, or other medicines, foods, dyes, or preservatives -pregnant or trying to get pregnant -breast-feeding How should I use this medicine? Take this  medicine by mouth with a full glass of water. Follow the directions on the prescription label. Take your medicine at regular intervals. Do not take your medicine more often than directed. Take all of your medicine as directed even if you think you are better. Do not skip doses or stop your medicine early. Talk to your pediatrician regarding the use of this medicine in children. Special care may be needed. Overdosage: If you think you have taken too much of this medicine contact a poison control center or emergency room at once. NOTE: This medicine is only for you. Do not share this medicine with others. What if I miss a dose? If you miss a dose, take it as soon as you can. If it is almost time for your next dose, take only that dose. Do not take double or extra doses. What may interact with this medicine? Do not take this medicine with any of the following medications: -alcohol or any product that contains alcohol -amprenavir oral solution -cisapride -disulfiram -dofetilide -dronedarone -paclitaxel injection -pimozide -ritonavir oral solution -sertraline oral solution -sulfamethoxazole-trimethoprim injection -thioridazine -ziprasidone This medicine may also interact with the following medications: -birth control pills -cimetidine -lithium -other medicines that prolong the QT interval (cause an abnormal heart rhythm) -phenobarbital -phenytoin -warfarin This list may not  describe all possible interactions. Give your health care provider a list of all the medicines, herbs, non-prescription drugs, or dietary supplements you use. Also tell them if you smoke, drink alcohol, or use illegal drugs. Some items may interact with your medicine. What should I watch for while using this medicine? Tell your doctor or health care professional if your symptoms do not improve or if they get worse. You may get drowsy or dizzy. Do not drive, use machinery, or do anything that needs mental alertness until  you know how this medicine affects you. Do not stand or sit up quickly, especially if you are an older patient. This reduces the risk of dizzy or fainting spells. Avoid alcoholic drinks while you are taking this medicine and for three days afterward. Alcohol may make you feel dizzy, sick, or flushed. If you are being treated for a sexually transmitted disease, avoid sexual contact until you have finished your treatment. Your sexual partner may also need treatment. What side effects may I notice from receiving this medicine? Side effects that you should report to your doctor or health care professional as soon as possible: -allergic reactions like skin rash or hives, swelling of the face, lips, or tongue -confusion, clumsiness -difficulty speaking -discolored or sore mouth -dizziness -fever, infection -numbness, tingling, pain or weakness in the hands or feet -trouble passing urine or change in the amount of urine -redness, blistering, peeling or loosening of the skin, including inside the mouth -seizures -unusually weak or tired -vaginal irritation, dryness, or discharge Side effects that usually do not require medical attention (report to your doctor or health care professional if they continue or are bothersome): -diarrhea -headache -irritability -metallic taste -nausea -stomach pain or cramps -trouble sleeping This list may not describe all possible side effects. Call your doctor for medical advice about side effects. You may report side effects to FDA at 1-800-FDA-1088. Where should I keep my medicine? Keep out of the reach of children. Store at room temperature below 25 degrees C (77 degrees F). Protect from light. Keep container tightly closed. Throw away any unused medicine after the expiration date. NOTE: This sheet is a summary. It may not cover all possible information. If you have questions about this medicine, talk to your doctor, pharmacist, or health care provider.  2015,  Elsevier/Gold Standard. (2012-10-13 14:08:39)  Acetaminophen; Oxycodone tablets What is this medicine? ACETAMINOPHEN; OXYCODONE (a set a MEE noe fen; ox i KOE done) is a pain reliever. It is used to treat mild to moderate pain. This medicine may be used for other purposes; ask your health care provider or pharmacist if you have questions. COMMON BRAND NAME(S): Endocet, Magnacet, Narvox, Percocet, Perloxx, Primalev, Primlev, Roxicet, Xolox What should I tell my health care provider before I take this medicine? They need to know if you have any of these conditions: -brain tumor -Crohn's disease, inflammatory bowel disease, or ulcerative colitis -drug abuse or addiction -head injury -heart or circulation problems -if you often drink alcohol -kidney disease or problems going to the bathroom -liver disease -lung disease, asthma, or breathing problems -an unusual or allergic reaction to acetaminophen, oxycodone, other opioid analgesics, other medicines, foods, dyes, or preservatives -pregnant or trying to get pregnant -breast-feeding How should I use this medicine? Take this medicine by mouth with a full glass of water. Follow the directions on the prescription label. Take your medicine at regular intervals. Do not take your medicine more often than directed. Talk to your pediatrician regarding the use of  this medicine in children. Special care may be needed. Patients over 36 years old may have a stronger reaction and need a smaller dose. Overdosage: If you think you have taken too much of this medicine contact a poison control center or emergency room at once. NOTE: This medicine is only for you. Do not share this medicine with others. What if I miss a dose? If you miss a dose, take it as soon as you can. If it is almost time for your next dose, take only that dose. Do not take double or extra doses. What may interact with this medicine? -alcohol -antihistamines -barbiturates like  amobarbital, butalbital, butabarbital, methohexital, pentobarbital, phenobarbital, thiopental, and secobarbital -benztropine -drugs for bladder problems like solifenacin, trospium, oxybutynin, tolterodine, hyoscyamine, and methscopolamine -drugs for breathing problems like ipratropium and tiotropium -drugs for certain stomach or intestine problems like propantheline, homatropine methylbromide, glycopyrrolate, atropine, belladonna, and dicyclomine -general anesthetics like etomidate, ketamine, nitrous oxide, propofol, desflurane, enflurane, halothane, isoflurane, and sevoflurane -medicines for depression, anxiety, or psychotic disturbances -medicines for sleep -muscle relaxants -naltrexone -narcotic medicines (opiates) for pain -phenothiazines like perphenazine, thioridazine, chlorpromazine, mesoridazine, fluphenazine, prochlorperazine, promazine, and trifluoperazine -scopolamine -tramadol -trihexyphenidyl This list may not describe all possible interactions. Give your health care provider a list of all the medicines, herbs, non-prescription drugs, or dietary supplements you use. Also tell them if you smoke, drink alcohol, or use illegal drugs. Some items may interact with your medicine. What should I watch for while using this medicine? Tell your doctor or health care professional if your pain does not go away, if it gets worse, or if you have new or a different type of pain. You may develop tolerance to the medicine. Tolerance means that you will need a higher dose of the medication for pain relief. Tolerance is normal and is expected if you take this medicine for a long time. Do not suddenly stop taking your medicine because you may develop a severe reaction. Your body becomes used to the medicine. This does NOT mean you are addicted. Addiction is a behavior related to getting and using a drug for a non-medical reason. If you have pain, you have a medical reason to take pain medicine. Your doctor  will tell you how much medicine to take. If your doctor wants you to stop the medicine, the dose will be slowly lowered over time to avoid any side effects. You may get drowsy or dizzy. Do not drive, use machinery, or do anything that needs mental alertness until you know how this medicine affects you. Do not stand or sit up quickly, especially if you are an older patient. This reduces the risk of dizzy or fainting spells. Alcohol may interfere with the effect of this medicine. Avoid alcoholic drinks. There are different types of narcotic medicines (opiates) for pain. If you take more than one type at the same time, you may have more side effects. Give your health care provider a list of all medicines you use. Your doctor will tell you how much medicine to take. Do not take more medicine than directed. Call emergency for help if you have problems breathing. The medicine will cause constipation. Try to have a bowel movement at least every 2 to 3 days. If you do not have a bowel movement for 3 days, call your doctor or health care professional. Do not take Tylenol (acetaminophen) or medicines that have acetaminophen with this medicine. Too much acetaminophen can be very dangerous. Many nonprescription medicines contain acetaminophen. Always read  the labels carefully to avoid taking more acetaminophen. What side effects may I notice from receiving this medicine? Side effects that you should report to your doctor or health care professional as soon as possible: -allergic reactions like skin rash, itching or hives, swelling of the face, lips, or tongue -breathing difficulties, wheezing -confusion -light headedness or fainting spells -severe stomach pain -unusually weak or tired -yellowing of the skin or the whites of the eyes Side effects that usually do not require medical attention (report to your doctor or health care professional if they continue or are  bothersome): -dizziness -drowsiness -nausea -vomiting This list may not describe all possible side effects. Call your doctor for medical advice about side effects. You may report side effects to FDA at 1-800-FDA-1088. Where should I keep my medicine? Keep out of the reach of children. This medicine can be abused. Keep your medicine in a safe place to protect it from theft. Do not share this medicine with anyone. Selling or giving away this medicine is dangerous and against the law. Store at room temperature between 20 and 25 degrees C (68 and 77 degrees F). Keep container tightly closed. Protect from light. This medicine may cause accidental overdose and death if it is taken by other adults, children, or pets. Flush any unused medicine down the toilet to reduce the chance of harm. Do not use the medicine after the expiration date. NOTE: This sheet is a summary. It may not cover all possible information. If you have questions about this medicine, talk to your doctor, pharmacist, or health care provider.  2015, Elsevier/Gold Standard. (2012-10-30 13:17:35)

## 2014-01-08 NOTE — Telephone Encounter (Signed)
Pt advise me that his pain is almost gone and he is okay until his f/u tomorrow

## 2014-01-08 NOTE — Telephone Encounter (Signed)
Pt left v/m; pt request cb [phone]; pt not having a lot of pain now and questions if needs pain med.Please advise.

## 2014-01-09 ENCOUNTER — Ambulatory Visit: Payer: BC Managed Care – PPO | Admitting: Family Medicine

## 2014-01-11 ENCOUNTER — Ambulatory Visit: Payer: BC Managed Care – PPO | Admitting: Family Medicine

## 2014-01-22 ENCOUNTER — Ambulatory Visit: Payer: BC Managed Care – PPO | Admitting: Family Medicine

## 2014-10-24 ENCOUNTER — Telehealth: Payer: Self-pay | Admitting: Family Medicine

## 2014-10-24 NOTE — Telephone Encounter (Signed)
[facility] Day - Client TELEPHONE ADVICE RECORD [facility] Patient Name: Glenn Stewart DOB: 11-Aug-1965 Initial Comment Caller states since Monday he has been having sx of heart attack, subsided Tuesday, still having chest pain, fatigue, headache. Nurse Assessment Nurse: Chrys Racer, RN, Alexia Freestone Date/Time Lamount Cohen Time): 10/24/2014 10:55:51 AM Confirm and document reason for call. If symptomatic, describe symptoms. ---Caller states since Monday he has been having sx of heart attack, subsided Tuesday, still having chest pain, fatigue, headache. Pt states the first episode was this past Tues, with similar s/s of chest pain, pressure, sweating. It resolved after " a little bit", then it is back today and has been for "over an hour, I took an aspirin". Pain radiates and aches all over, somewhat better now but still there. No N/V. Has the patient traveled out of the country within the last 30 days? ---No Does the patient require triage? ---Yes Related visit to physician within the last 2 weeks? ---No Does the PT have any chronic conditions? (i.e. diabetes, asthma, etc.) ---Yes List chronic conditions. ---anxiety, HBP, high cholesterol, "heart trouble" Guidelines Guideline Title Affirmed Question Affirmed Notes Chest Pain Difficulty breathing Final Disposition User Go to ED Now Chrys Racer, RN, Alexia Freestone Referrals [facility] - ED [facility] - ED Disagree/Comply: Comply

## 2014-10-24 NOTE — Telephone Encounter (Signed)
Dr Milinda Antis out of office today; sent to Dr Milinda Antis, [facility] CMA and Dr Sharen Hones.

## 2014-10-24 NOTE — Telephone Encounter (Signed)
Agree with ED disposition Will watch for his notes on epic

## 2014-12-11 ENCOUNTER — Other Ambulatory Visit: Payer: Self-pay | Admitting: Family Medicine

## 2014-12-11 NOTE — Telephone Encounter (Signed)
Pt hasn't been seen in over a year and has no future appt., called pt to scheduled a med refill appt and no answer so left voicemail requesting pt to call office back

## 2014-12-13 NOTE — Telephone Encounter (Signed)
Glenn Stewart left v/m requesting status of refills for simvastatin and amlodipine.

## 2014-12-13 NOTE — Telephone Encounter (Signed)
Left voicemail letting pharmacy know he needs f/u appt before I can refill meds

## 2014-12-13 NOTE — Telephone Encounter (Signed)
Left 2nd voicemail requesting pt to call office back 

## 2014-12-19 NOTE — Telephone Encounter (Signed)
Left final voicemail letting pt know needs an appt and meds declined until pt call back and letter mailed to pt

## 2017-05-23 NOTE — Progress Notes (Deleted)
Glenn Palm MD Reason for referral-Chest pain  HPI: 52 year old male for evaluation of chest pain at request of Roxy Manns MD.  I have seen this patient previously but not since 2010.  Stress echocardiogram October 2010 normal.  Current Outpatient Medications  Medication Sig Dispense Refill  . ALPRAZolam (XANAX) 1 MG tablet Take 1 tablet by mouth Once daily as needed for anxiety.     Marland Kitchen amLODipine (NORVASC) 10 MG tablet Take 10 mg by mouth daily.    . Carbamazepine (EQUETRO) 100 MG CP12 12 hr capsule Take 100 mg by mouth at bedtime.    . ciprofloxacin (CIPRO) 500 MG tablet Take 1 tablet (500 mg total) by mouth 2 (two) times daily. 20 tablet 0  . dexmethylphenidate (FOCALIN XR) 15 MG 24 hr capsule Take 30 mg by mouth every morning.     . fish oil-omega-3 fatty acids 1000 MG capsule Take 2 g by mouth daily.      . metroNIDAZOLE (FLAGYL) 500 MG tablet Take 1 tablet (500 mg total) by mouth 3 (three) times daily. 30 tablet 0  . Multiple Vitamin (MULTIVITAMIN) capsule Take 1 capsule by mouth daily.      Marland Kitchen oxyCODONE-acetaminophen (PERCOCET) 5-325 MG per tablet Take 1 tablet by mouth every 4 (four) hours as needed for moderate pain. 20 tablet 0  . sertraline (ZOLOFT) 100 MG tablet Take 100 mg by mouth daily.      . simvastatin (ZOCOR) 20 MG tablet Take 20 mg by mouth daily.    . traMADol (ULTRAM) 50 MG tablet Take 1-2 tablets (50-100 mg total) by mouth every 8 (eight) hours as needed for severe pain. 45 tablet 0   No current facility-administered medications for this visit.     Allergies  Allergen Reactions  . Sulfonamide Derivatives     REACTION: hives    Past Medical History:  Diagnosis Date  . Acute bronchitis   . ADHD (attention deficit hyperactivity disorder)   . Anxiety state, unspecified   . Bipolar 1 disorder   . Depressive disorder, not elsewhere classified   . Dermatophytosis of nail   . Esophageal reflux   . High cholesterol   . Other abnormal blood chemistry    . Other abnormal glucose   . Other chronic nonalcoholic liver disease   . Pain in joint, forearm   . Problems with hearing   . Prostatitis, unspecified   . Pure hypercholesterolemia   . Sprain of cruciate ligament of knee   . Tear of medial cartilage or meniscus of knee, current   . Unspecified essential hypertension   . Unspecified gastritis and gastroduodenitis without mention of hemorrhage   . Unspecified internal derangement of knee   . Urethritis, unspecified     No past surgical history on file.  Social History   Socioeconomic History  . Marital status: Single    Spouse name: Not on file  . Number of children: 0  . Years of education: Not on file  . Highest education level: Not on file  Social Needs  . Financial resource strain: Not on file  . Food insecurity - worry: Not on file  . Food insecurity - inability: Not on file  . Transportation needs - medical: Not on file  . Transportation needs - non-medical: Not on file  Occupational History  . Occupation: Owns Physicist, medical  Tobacco Use  . Smoking status: Never Smoker  Substance and Sexual Activity  . Alcohol use: Yes    Comment: rare  .  Drug use: No  . Sexual activity: Not on file  Other Topics Concern  . Not on file  Social History Narrative  . Not on file    Family History  Problem Relation Age of Onset  . Diabetes Father   . Hypertension Father     ROS: no fevers or chills, productive cough, hemoptysis, dysphasia, odynophagia, melena, hematochezia, dysuria, hematuria, rash, seizure activity, orthopnea, PND, pedal edema, claudication. Remaining systems are negative.  Physical Exam:   There were no vitals taken for this visit.  General:  Well developed/well nourished in NAD Skin warm/dry Patient not depressed No peripheral clubbing Back-normal HEENT-normal/normal eyelids Neck supple/normal carotid upstroke bilaterally; no bruits; no JVD; no thyromegaly chest - CTA/ normal expansion CV -  RRR/normal S1 and S2; no murmurs, rubs or gallops;  PMI nondisplaced Abdomen -NT/ND, no HSM, no mass, + bowel sounds, no bruit 2+ femoral pulses, no bruits Ext-no edema, chords, 2+ DP Neuro-grossly nonfocal  ECG - personally reviewed  A/P  1  Glenn MillersBrian Romie Keeble, MD

## 2017-05-30 ENCOUNTER — Encounter: Payer: Self-pay | Admitting: *Deleted

## 2017-05-31 ENCOUNTER — Ambulatory Visit: Payer: Self-pay | Admitting: Cardiology

## 2017-08-10 NOTE — Progress Notes (Signed)
Valda Lamb MD Reason for referral-chest pain  HPI: 52 year old male for evaluation of chest pain at request of Roxy Manns MD.  Patient seen previously but not since September 2010.  Stress echocardiogram October 2010 normal.  Patient states he has had intermittent chest pain since February.  He notices this with exertion and it is relieved after 30 minutes with rest.  It starts in his left arm and radiates to his chest into his head.  It is described as a pressure.  There is mild dyspnea and diaphoresis but no nausea or vomiting.  He does not have the symptoms at rest.  No orthopnea, PND, pedal edema, syncope.  Current Outpatient Medications  Medication Sig Dispense Refill  . ALPRAZolam (XANAX) 1 MG tablet Take 1 tablet by mouth Once daily as needed for anxiety.     Marland Kitchen amLODipine (NORVASC) 10 MG tablet Take 10 mg by mouth daily.    . Carbamazepine (EQUETRO) 100 MG CP12 12 hr capsule Take 100 mg by mouth at bedtime.    . ciprofloxacin (CIPRO) 500 MG tablet Take 1 tablet (500 mg total) by mouth 2 (two) times daily. 20 tablet 0  . dexmethylphenidate (FOCALIN XR) 15 MG 24 hr capsule Take 30 mg by mouth every morning.     . fish oil-omega-3 fatty acids 1000 MG capsule Take 2 g by mouth daily.      . metroNIDAZOLE (FLAGYL) 500 MG tablet Take 1 tablet (500 mg total) by mouth 3 (three) times daily. 30 tablet 0  . Multiple Vitamin (MULTIVITAMIN) capsule Take 1 capsule by mouth daily.      Marland Kitchen oxyCODONE-acetaminophen (PERCOCET) 5-325 MG per tablet Take 1 tablet by mouth every 4 (four) hours as needed for moderate pain. 20 tablet 0  . sertraline (ZOLOFT) 100 MG tablet Take 100 mg by mouth daily.      . simvastatin (ZOCOR) 20 MG tablet Take 20 mg by mouth daily.    . traMADol (ULTRAM) 50 MG tablet Take 1-2 tablets (50-100 mg total) by mouth every 8 (eight) hours as needed for severe pain. 45 tablet 0   No current facility-administered medications for this visit.     Allergies  Allergen  Reactions  . Sulfonamide Derivatives     REACTION: hives     Past Medical History:  Diagnosis Date  . ADHD (attention deficit hyperactivity disorder)   . Anxiety state, unspecified   . Bipolar 1 disorder (HCC)   . Depressive disorder, not elsewhere classified   . Dermatophytosis of nail   . Esophageal reflux   . Other chronic nonalcoholic liver disease   . Problems with hearing   . Prostatitis, unspecified   . Pure hypercholesterolemia   . Sprain of cruciate ligament of knee   . Tear of medial cartilage or meniscus of knee, current   . Unspecified essential hypertension   . Unspecified gastritis and gastroduodenitis without mention of hemorrhage   . Urethritis, unspecified     Past Surgical History:  Procedure Laterality Date  . No prior surgery      Social History   Socioeconomic History  . Marital status: Single    Spouse name: Not on file  . Number of children: 0  . Years of education: Not on file  . Highest education level: Not on file  Occupational History  . Occupation: Owns Physicist, medical  Social Needs  . Financial resource strain: Not on file  . Food insecurity:    Worry: Not on file  Inability: Not on file  . Transportation needs:    Medical: Not on file    Non-medical: Not on file  Tobacco Use  . Smoking status: Never Smoker  . Smokeless tobacco: Never Used  Substance and Sexual Activity  . Alcohol use: Yes    Comment: rare  . Drug use: No  . Sexual activity: Not on file  Lifestyle  . Physical activity:    Days per week: Not on file    Minutes per session: Not on file  . Stress: Not on file  Relationships  . Social connections:    Talks on phone: Not on file    Gets together: Not on file    Attends religious service: Not on file    Active member of club or organization: Not on file    Attends meetings of clubs or organizations: Not on file    Relationship status: Not on file  . Intimate partner violence:    Fear of current or ex  partner: Not on file    Emotionally abused: Not on file    Physically abused: Not on file    Forced sexual activity: Not on file  Other Topics Concern  . Not on file  Social History Narrative  . Not on file    Family History  Problem Relation Age of Onset  . Diabetes Father   . Hypertension Father   . Heart failure Father     ROS: no fevers or chills, productive cough, hemoptysis, dysphasia, odynophagia, melena, hematochezia, dysuria, hematuria, rash, seizure activity, orthopnea, PND, pedal edema, claudication. Remaining systems are negative.  Physical Exam:   Blood pressure 139/87, pulse 80, height 5' 6.5" (1.689 m), weight 228 lb 3.2 oz (103.5 kg).  General:  Well developed/well nourished in NAD Skin warm/dry Patient not depressed No peripheral clubbing Back-normal HEENT-normal/normal eyelids Neck supple/normal carotid upstroke bilaterally; no bruits; no JVD; no thyromegaly chest - CTA/ normal expansion CV - RRR/normal S1 and S2; no murmurs, rubs or gallops;  PMI nondisplaced Abdomen -NT/ND, no HSM, no mass, + bowel sounds, no bruit 2+ femoral pulses, no bruits Ext-no edema, chords, 2+ DP Neuro-grossly nonfocal  ECG -normal sinus rhythm at a rate of 80.  Nonspecific ST changes.  Personally reviewed  A/P  1 chest pain-symptoms with both typical and atypical features.  They occur with exertion and relieved with rest but he also describes head tightness.  Patient has no insurance.  We discussed a CTA but we elected instead to proceed with an exercise treadmill and calcium score.  I will have low threshold for cardiac catheterization.  2 hyperlipidemia-continue statin.  3 hypertension-blood pressure is controlled.  Continue present medications.  Olga Millers, MD

## 2017-08-16 ENCOUNTER — Ambulatory Visit (INDEPENDENT_AMBULATORY_CARE_PROVIDER_SITE_OTHER): Payer: Self-pay | Admitting: Cardiology

## 2017-08-16 ENCOUNTER — Encounter: Payer: Self-pay | Admitting: Cardiology

## 2017-08-16 VITALS — BP 139/87 | HR 80 | Ht 66.5 in | Wt 228.2 lb

## 2017-08-16 DIAGNOSIS — E7800 Pure hypercholesterolemia, unspecified: Secondary | ICD-10-CM

## 2017-08-16 DIAGNOSIS — R072 Precordial pain: Secondary | ICD-10-CM

## 2017-08-16 DIAGNOSIS — I10 Essential (primary) hypertension: Secondary | ICD-10-CM

## 2017-08-16 NOTE — Patient Instructions (Addendum)
Medication Instructions:   NO CHANGE  Testing/Procedures:  Your physician has requested that you have an exercise tolerance test. For further information please visit [email or web address] Please also follow instruction sheet, as given.    CT CALCIUM SCORE  Follow-Up:  Your physician recommends that you schedule a follow-up appointment in: 3 MONTHS WITH DR Jens Som

## 2017-08-24 ENCOUNTER — Telehealth (HOSPITAL_COMMUNITY): Payer: Self-pay

## 2017-08-24 NOTE — Telephone Encounter (Signed)
Encounter complete. 

## 2017-08-30 ENCOUNTER — Ambulatory Visit (HOSPITAL_COMMUNITY)
Admission: RE | Admit: 2017-08-30 | Discharge: 2017-08-30 | Disposition: A | Discharge status: Home / Self Care | Payer: Self-pay | Source: Ambulatory Visit | Attending: Cardiovascular Disease | Admitting: Cardiovascular Disease

## 2017-08-30 DIAGNOSIS — R072 Precordial pain: Secondary | ICD-10-CM

## 2017-08-30 LAB — EXERCISE TOLERANCE TEST
CSEPED: 7 min
CSEPEDS: 57 s
Estimated workload: 10 METS
MPHR: 168 {beats}/min
Peak HR: 144 {beats}/min
Percent HR: 86 %
RPE: 17
Rest HR: 82 {beats}/min

## 2017-09-01 ENCOUNTER — Ambulatory Visit (INDEPENDENT_AMBULATORY_CARE_PROVIDER_SITE_OTHER)
Admission: RE | Admit: 2017-09-01 | Discharge: 2017-09-01 | Disposition: A | Discharge status: Home / Self Care | Payer: Self-pay | Source: Ambulatory Visit | Attending: Cardiology | Admitting: Cardiology

## 2017-09-01 ENCOUNTER — Telehealth: Payer: Self-pay | Admitting: Cardiology

## 2017-09-01 DIAGNOSIS — R072 Precordial pain: Secondary | ICD-10-CM

## 2017-09-01 MED ORDER — SIMVASTATIN 20 MG PO TABS: 20.0000 mg | ORAL_TABLET | Freq: Every day | ORAL | 4 refills | Status: DC

## 2017-09-01 NOTE — Telephone Encounter (Signed)
New Message   Pt c/o medication issue:  1. Name of Medication: Cholesterol medication   2. How are you currently taking this medication (dosage and times per day)? n/a  3. Are you having a reaction (difficulty breathing--STAT)? n/a  4. What is your medication issue? Pt states he had to stop taking his cholesterol medication due to insurance issues but would like to start back taking it, and not sure the name. Please call

## 2017-09-01 NOTE — Telephone Encounter (Signed)
Return call to pt who reports he had stop taking his Zocor due to insurance but would like to start back taking it. Per OV on 08/16/17 pt is to continue with statin.  Pt informed that new prescription would be e sent to pharmacy.

## 2017-09-14 NOTE — Progress Notes (Deleted)
HPI: FU chest pain. Patient had exercise treadmill June 2019 that was normal.  Calcium score June 2019 was 2076.  Since last seen  Current Outpatient Medications  Medication Sig Dispense Refill  . ALPRAZolam (XANAX) 1 MG tablet Take 1 tablet by mouth Once daily as needed for anxiety.     Marland Kitchen. amLODipine (NORVASC) 10 MG tablet Take 10 mg by mouth daily.    . Carbamazepine (EQUETRO) 100 MG CP12 12 hr capsule Take 100 mg by mouth at bedtime.    . ciprofloxacin (CIPRO) 500 MG tablet Take 1 tablet (500 mg total) by mouth 2 (two) times daily. 20 tablet 0  . dexmethylphenidate (FOCALIN XR) 15 MG 24 hr capsule Take 30 mg by mouth every morning.     . fish oil-omega-3 fatty acids 1000 MG capsule Take 2 g by mouth daily.      . metroNIDAZOLE (FLAGYL) 500 MG tablet Take 1 tablet (500 mg total) by mouth 3 (three) times daily. 30 tablet 0  . Multiple Vitamin (MULTIVITAMIN) capsule Take 1 capsule by mouth daily.      Marland Kitchen. oxyCODONE-acetaminophen (PERCOCET) 5-325 MG per tablet Take 1 tablet by mouth every 4 (four) hours as needed for moderate pain. 20 tablet 0  . sertraline (ZOLOFT) 100 MG tablet Take 100 mg by mouth daily.      . simvastatin (ZOCOR) 20 MG tablet Take 1 tablet (20 mg total) by mouth daily. 30 tablet 4  . traMADol (ULTRAM) 50 MG tablet Take 1-2 tablets (50-100 mg total) by mouth every 8 (eight) hours as needed for severe pain. 45 tablet 0   No current facility-administered medications for this visit.      Past Medical History:  Diagnosis Date  . ADHD (attention deficit hyperactivity disorder)   . Anxiety state, unspecified   . Bipolar 1 disorder (HCC)   . Depressive disorder, not elsewhere classified   . Dermatophytosis of nail   . Esophageal reflux   . Other chronic nonalcoholic liver disease   . Problems with hearing   . Prostatitis, unspecified   . Pure hypercholesterolemia   . Sprain of cruciate ligament of knee   . Tear of medial cartilage or meniscus of knee, current   .  Unspecified essential hypertension   . Unspecified gastritis and gastroduodenitis without mention of hemorrhage   . Urethritis, unspecified     Past Surgical History:  Procedure Laterality Date  . No prior surgery      Social History   Socioeconomic History  . Marital status: Single    Spouse name: Not on file  . Number of children: 0  . Years of education: Not on file  . Highest education level: Not on file  Occupational History  . Occupation: Owns Physicist, [employer]  Social Needs  . Financial resource strain: Not on file  . Food insecurity:    Worry: Not on file    Inability: Not on file  . Transportation needs:    Medical: Not on file    Non-medical: Not on file  Tobacco Use  . Smoking status: Never Smoker  . Smokeless tobacco: Never Used  Substance and Sexual Activity  . Alcohol use: Yes    Comment: rare  . Drug use: No  . Sexual activity: Not on file  Lifestyle  . Physical activity:    Days per week: Not on file    Minutes per session: Not on file  . Stress: Not on file  Relationships  .  Social connections:    Talks on phone: Not on file    Gets together: Not on file    Attends religious service: Not on file    Active member of club or organization: Not on file    Attends meetings of clubs or organizations: Not on file    Relationship status: Not on file  . Intimate partner violence:    Fear of current or ex partner: Not on file    Emotionally abused: Not on file    Physically abused: Not on file    Forced sexual activity: Not on file  Other Topics Concern  . Not on file  Social History Narrative  . Not on file    Family History  Problem Relation Age of Onset  . Diabetes Father   . Hypertension Father   . Heart failure Father     ROS: no fevers or chills, productive cough, hemoptysis, dysphasia, odynophagia, melena, hematochezia, dysuria, hematuria, rash, seizure activity, orthopnea, PND, pedal edema, claudication. Remaining systems are  negative.  Physical Exam: Well-developed well-nourished in no acute distress.  Skin is warm and dry.  HEENT is normal.  Neck is supple.  Chest is clear to auscultation with normal expansion.  Cardiovascular exam is regular rate and rhythm.  Abdominal exam nontender or distended. No masses palpated. Extremities show no edema. neuro grossly intact  ECG- personally reviewed  A/P  1 coronary artery disease-  2 chest pain-  3 hypertension-blood pressure is controlled.  Continue present medications.  4 hyperlipidemia-continue statin.  Olga Millers, MD

## 2017-09-23 ENCOUNTER — Ambulatory Visit: Payer: Self-pay | Admitting: Cardiology

## 2017-09-26 ENCOUNTER — Encounter: Payer: Self-pay | Admitting: *Deleted

## 2017-11-24 NOTE — Progress Notes (Signed)
HPI: Fu chest pain/CAD. Stress echocardiogram October 2010 normal.  Exercise treadmill June 2019 normal.   Calcium score June 2019 2076.  Since last seen he states that for the past month he has not had any recurrent chest pain.  He denies dyspnea or syncope.  Note his symptoms prior to that were chest pain that was diffuse that occurred with exertion and was relieved with rest.  Current Outpatient Medications  Medication Sig Dispense Refill  . ALPRAZolam (XANAX) 1 MG tablet Take 1 tablet by mouth Once daily as needed for anxiety.     Marland Kitchen amLODipine (NORVASC) 10 MG tablet Take 10 mg by mouth daily.    . fish oil-omega-3 fatty acids 1000 MG capsule Take 2 g by mouth daily.      . Multiple Vitamin (MULTIVITAMIN) capsule Take 1 capsule by mouth daily.      . sertraline (ZOLOFT) 100 MG tablet Take 100 mg by mouth daily.      . simvastatin (ZOCOR) 20 MG tablet Take 1 tablet (20 mg total) by mouth daily. 30 tablet 4   No current facility-administered medications for this visit.      Past Medical History:  Diagnosis Date  . ADHD (attention deficit hyperactivity disorder)   . Anxiety state, unspecified   . Bipolar 1 disorder (HCC)   . CHEST PAIN 12/04/2008   Qualifier: Diagnosis of  By: Milinda Antis MD, Colon Flattery   . Depressive disorder, not elsewhere classified   . Dermatophytosis of nail   . Esophageal reflux   . HYPERGLYCEMIA 07/10/2007   Qualifier: Diagnosis of  By: Milinda Antis MD, Colon Flattery   . Kidney stones 09/22/2010  . Low back pain 09/18/2013  . Other chronic nonalcoholic liver disease   . Problems with hearing   . Prostatitis, unspecified   . Pure hypercholesterolemia   . Screen for STD (sexually transmitted disease) 04/20/2011  . Sprain of cruciate ligament of knee   . Tear of medial cartilage or meniscus of knee, current   . TMJ (temporomandibular joint disorder) 12/25/2010  . Unspecified essential hypertension   . Unspecified gastritis and gastroduodenitis without mention of hemorrhage    . Urethritis, unspecified     Past Surgical History:  Procedure Laterality Date  . No prior surgery      Social History   Socioeconomic History  . Marital status: Single    Spouse name: Not on file  . Number of children: 0  . Years of education: Not on file  . Highest education level: Not on file  Occupational History  . Occupation: Owns Physicist, medical  Social Needs  . Financial resource strain: Not on file  . Food insecurity:    Worry: Not on file    Inability: Not on file  . Transportation needs:    Medical: Not on file    Non-medical: Not on file  Tobacco Use  . Smoking status: Never Smoker  . Smokeless tobacco: Never Used  Substance and Sexual Activity  . Alcohol use: Yes    Comment: rare  . Drug use: No  . Sexual activity: Not on file  Lifestyle  . Physical activity:    Days per week: Not on file    Minutes per session: Not on file  . Stress: Not on file  Relationships  . Social connections:    Talks on phone: Not on file    Gets together: Not on file    Attends religious service: Not on file  Active member of club or organization: Not on file    Attends meetings of clubs or organizations: Not on file    Relationship status: Not on file  . Intimate partner violence:    Fear of current or ex partner: Not on file    Emotionally abused: Not on file    Physically abused: Not on file    Forced sexual activity: Not on file  Other Topics Concern  . Not on file  Social History Narrative  . Not on file    Family History  Problem Relation Age of Onset  . Diabetes Father   . Hypertension Father   . Heart failure Father     ROS: no fevers or chills, productive cough, hemoptysis, dysphasia, odynophagia, melena, hematochezia, dysuria, hematuria, rash, seizure activity, orthopnea, PND, pedal edema, claudication. Remaining systems are negative.  Physical Exam: Well-developed well-nourished in no acute distress.  Skin is warm and dry.  HEENT is normal.    Neck is supple.  Chest is clear to auscultation with normal expansion.  Cardiovascular exam is regular rate and rhythm.  Abdominal exam nontender or distended. No masses palpated. Extremities show no edema. neuro grossly intact   A/P  1 chest pain-previous symptoms consistent with angina.  We elected to pursue calcium score and exercise treadmill at that time as he did not have insurance and was concerned about expense.  He states he has limited his activities and he has had no further symptoms in the past 1 month but does not push himself.  His calcium score was significantly elevated.  I am concerned about underlying coronary disease and have recommended cardiac catheterization.  I discussed the risks including myocardial infarction, CVA and death.  He remains concerned about the expense as he has no insurance.  He will look into insurance in the next 2 days and then contact us when he is willing to proceed with catheterization.  I have asked him to be seen in the emergency room if he has recurrent chest pain.  We will continue aspirin and amlodipine.  Add Toprol 50 mg daily.  2 hypertension-patient's blood pressure is elevated.  Add Toprol both for blood pressure control and as an antianginal.  50 mg daily and follow.  3 hyperlipidemia-given elevated calcium score I will discontinue Zocor and instead treat with Crestor 40 mg daily.  Check lipids and liver in 4 weeks.  Olga Millers, MD

## 2017-11-28 ENCOUNTER — Ambulatory Visit (INDEPENDENT_AMBULATORY_CARE_PROVIDER_SITE_OTHER): Payer: Self-pay | Admitting: Cardiology

## 2017-11-28 ENCOUNTER — Telehealth: Payer: Self-pay

## 2017-11-28 ENCOUNTER — Encounter: Payer: Self-pay | Admitting: Cardiology

## 2017-11-28 VITALS — BP 140/98 | HR 66 | Ht 66.0 in | Wt 226.0 lb

## 2017-11-28 DIAGNOSIS — E7800 Pure hypercholesterolemia, unspecified: Secondary | ICD-10-CM

## 2017-11-28 DIAGNOSIS — I209 Angina pectoris, unspecified: Secondary | ICD-10-CM

## 2017-11-28 DIAGNOSIS — I10 Essential (primary) hypertension: Secondary | ICD-10-CM

## 2017-11-28 DIAGNOSIS — Z79899 Other long term (current) drug therapy: Secondary | ICD-10-CM

## 2017-11-28 MED ORDER — ROSUVASTATIN CALCIUM 40 MG PO TABS: 40.0000 mg | ORAL_TABLET | Freq: Every day | ORAL | 3 refills | Status: DC

## 2017-11-28 MED ORDER — METOPROLOL SUCCINATE ER 50 MG PO TB24: 50.0000 mg | ORAL_TABLET | Freq: Every day | ORAL | 3 refills | Status: DC

## 2017-11-28 NOTE — Telephone Encounter (Signed)
Attempted to call pt and provide cost of Cath as pt is self pay.Unable to leave message due to phone continue to ring.

## 2017-11-28 NOTE — Telephone Encounter (Signed)
Pt updated with the cost of Cath procedure. Pt states he will continue to check on insurance and will call back to reschedule cath.

## 2017-11-28 NOTE — Patient Instructions (Addendum)
Medication Instructions: Your Physician recommend you make the following changes to your medication. Stop: Zocor 20 mg Start: Crestor 40 mg daily Start: Toprol 50 mg daily  If you need a refill on your cardiac medications before your next appointment, please call your pharmacy.   Labwork: Your physician recommends that you return for lab 4week (Lipid. LFT)   Procedures/Testing: None  Follow-Up: Your physician wants you to follow-up in 2 weeks with APP  Your physician recommends that you schedule a follow-up appointment in 3 months with Dr. Jens Som.   Special Instructions:       Thank you for choosing [facility]!!

## 2017-12-12 ENCOUNTER — Telehealth: Payer: Self-pay | Admitting: Cardiology

## 2017-12-12 NOTE — Telephone Encounter (Signed)
New Message:   Patient has some concerns about a medication change which is causing headaches.

## 2017-12-12 NOTE — Telephone Encounter (Signed)
Returned call to patient of Dr. Jens Somrenshaw. He reports headaches started after changed from simvastatin to crestor. He has since stopped crestor and symptoms resolved. He is not on statin therapy at present as he has no more simvastatin. He would like MD recommendations going forward.   Routed to MD/RN

## 2017-12-12 NOTE — Telephone Encounter (Signed)
lipitor  80 mg daily; lipids and liver 4 weeks Olga MillersBrian Crenshaw

## 2017-12-13 ENCOUNTER — Ambulatory Visit: Payer: Self-pay | Admitting: Physician Assistant

## 2017-12-13 MED ORDER — ATORVASTATIN CALCIUM 80 MG PO TABS: 80.0000 mg | ORAL_TABLET | Freq: Every day | ORAL | 1 refills | Status: DC

## 2017-12-13 NOTE — Telephone Encounter (Signed)
Left message for pt to call.

## 2017-12-13 NOTE — Telephone Encounter (Signed)
Called, gave medication instructions. DC'd Crestor, and sent in Lipitor 80 mg daily. Patient verbalized understanding to come in for labs in 4 weeks.

## 2017-12-13 NOTE — Telephone Encounter (Signed)
Follow up  ° ° °Patient is returning call in reference to medication. Please call.  °

## 2017-12-15 ENCOUNTER — Telehealth: Payer: Self-pay | Admitting: Cardiology

## 2017-12-15 MED ORDER — SIMVASTATIN 20 MG PO TABS: 20.0000 mg | ORAL_TABLET | Freq: Every day | ORAL | 3 refills | Status: DC

## 2017-12-15 NOTE — Telephone Encounter (Signed)
Resume zocor Olga Millers

## 2017-12-15 NOTE — Telephone Encounter (Signed)
Spoke with pt. Pt sts that the message to resume Crestor was incorrect. He has taken Crestor and Lipitor in the past and experienced side effects.  He had taken Zocor for years and tolerated it well. He would like to resume Simvastatin (Zocor).  Adv pt that I will fwd the update to Dr.Crenshaw, we will call back with his response.

## 2017-12-15 NOTE — Telephone Encounter (Signed)
Spoke with pt, Aware of dr crenshaw's recommendations. New script sent to the pharmacy  

## 2017-12-15 NOTE — Telephone Encounter (Signed)
New Message:       Patient is requesting to stay on the Crestor because the Lipitor in the past has caused his stomach pain and drowsiness.

## 2017-12-15 NOTE — Telephone Encounter (Signed)
Continue crestor 40 mg daily Glenn Stewart

## 2018-02-20 NOTE — Progress Notes (Deleted)
HPI: Fu chest pain/CAD. Stress echocardiogram October 2010 normal.  Exercise treadmill June 2019 normal. Calcium score June 2019 2076.    At last office visit November 28, 2017 we recommended cardiac catheterization.  However he was concerned about the expense and was not agreeable at the time.  We have treated medically.  Since last seen   Current Outpatient Medications  Medication Sig Dispense Refill  . ALPRAZolam (XANAX) 1 MG tablet Take 1 tablet by mouth Once daily as needed for anxiety.     Marland Kitchen. amLODipine (NORVASC) 10 MG tablet Take 10 mg by mouth daily.    Marland Kitchen. aspirin EC 81 MG tablet Take 81 mg by mouth daily.    . fish oil-omega-3 fatty acids 1000 MG capsule Take 2 g by mouth daily.      . metoprolol succinate (TOPROL-XL) 50 MG 24 hr tablet Take 1 tablet (50 mg total) by mouth daily. Take with or immediately following a meal. 90 tablet 3  . Multiple Vitamin (MULTIVITAMIN) capsule Take 1 capsule by mouth daily.      . sertraline (ZOLOFT) 100 MG tablet Take 100 mg by mouth daily.      . simvastatin (ZOCOR) 20 MG tablet Take 1 tablet (20 mg total) by mouth at bedtime. 90 tablet 3   No current facility-administered medications for this visit.      Past Medical History:  Diagnosis Date  . ADHD (attention deficit hyperactivity disorder)   . Anxiety state, unspecified   . Bipolar 1 disorder (HCC)   . CHEST PAIN 12/04/2008   Qualifier: Diagnosis of  By: Milinda Antisower MD, Colon FlatteryMarne Ann   . Depressive disorder, not elsewhere classified   . Dermatophytosis of nail   . Esophageal reflux   . HYPERGLYCEMIA 07/10/2007   Qualifier: Diagnosis of  By: Milinda Antisower MD, Colon FlatteryMarne Ann   . Kidney stones 09/22/2010  . Low back pain 09/18/2013  . Other chronic nonalcoholic liver disease   . Problems with hearing   . Prostatitis, unspecified   . Pure hypercholesterolemia   . Screen for STD (sexually transmitted disease) 04/20/2011  . Sprain of cruciate ligament of knee   . Tear of medial cartilage or meniscus of knee,  current   . TMJ (temporomandibular joint disorder) 12/25/2010  . Unspecified essential hypertension   . Unspecified gastritis and gastroduodenitis without mention of hemorrhage   . Urethritis, unspecified     Past Surgical History:  Procedure Laterality Date  . No prior surgery      Social History   Socioeconomic History  . Marital status: Single    Spouse name: Not on file  . Number of children: 0  . Years of education: Not on file  . Highest education level: Not on file  Occupational History  . Occupation: Owns Physicist, [employer] shop  Social Needs  . Financial resource strain: Not on file  . Food insecurity:    Worry: Not on file    Inability: Not on file  . Transportation needs:    Medical: Not on file    Non-medical: Not on file  Tobacco Use  . Smoking status: Never Smoker  . Smokeless tobacco: Never Used  Substance and Sexual Activity  . Alcohol use: Yes    Comment: rare  . Drug use: No  . Sexual activity: Not on file  Lifestyle  . Physical activity:    Days per week: Not on file    Minutes per session: Not on file  . Stress: Not on  file  Relationships  . Social connections:    Talks on phone: Not on file    Gets together: Not on file    Attends religious service: Not on file    Active member of club or organization: Not on file    Attends meetings of clubs or organizations: Not on file    Relationship status: Not on file  . Intimate partner violence:    Fear of current or ex partner: Not on file    Emotionally abused: Not on file    Physically abused: Not on file    Forced sexual activity: Not on file  Other Topics Concern  . Not on file  Social History Narrative  . Not on file    Family History  Problem Relation Age of Onset  . Diabetes Father   . Hypertension Father   . Heart failure Father     ROS: no fevers or chills, productive cough, hemoptysis, dysphasia, odynophagia, melena, hematochezia, dysuria, hematuria, rash, seizure activity, orthopnea,  PND, pedal edema, claudication. Remaining systems are negative.  Physical Exam: Well-developed well-nourished in no acute distress.  Skin is warm and dry.  HEENT is normal.  Neck is supple.  Chest is clear to auscultation with normal expansion.  Cardiovascular exam is regular rate and rhythm.  Abdominal exam nontender or distended. No masses palpated. Extremities show no edema. neuro grossly intact  ECG- personally reviewed  A/P  1  Olga Millers, MD

## 2018-03-06 ENCOUNTER — Ambulatory Visit: Payer: Self-pay | Admitting: Cardiology

## 2018-09-16 ENCOUNTER — Other Ambulatory Visit: Payer: Self-pay | Admitting: Cardiology

## 2018-10-17 ENCOUNTER — Other Ambulatory Visit: Payer: Self-pay | Admitting: Cardiology

## 2018-10-17 NOTE — Telephone Encounter (Signed)
°*  STAT* If patient is at the pharmacy, call can be transferred to refill team.   1. Which medications need to be refilled? (please list name of each medication and dose if known) simvastatin (ZOCOR) 20 MG tablet  2. Which pharmacy/location (including street and city if local pharmacy) is medication to be sent to?  [facility] # [address], [place]  3. Do they need a 30 day or 90 day supply? 90 day  Patient is currently positive for COVID.

## 2018-10-20 MED ORDER — SIMVASTATIN 20 MG PO TABS: 20.0000 mg | ORAL_TABLET | Freq: Every day | ORAL | 0 refills | Status: DC

## 2018-10-20 NOTE — Telephone Encounter (Signed)
Rx(s) sent to pharmacy electronically.  

## 2018-11-08 ENCOUNTER — Encounter (HOSPITAL_COMMUNITY): Payer: Self-pay | Admitting: Urgent Care

## 2018-11-08 ENCOUNTER — Other Ambulatory Visit: Payer: Self-pay

## 2018-11-08 ENCOUNTER — Ambulatory Visit (HOSPITAL_COMMUNITY)
Admission: EM | Admit: 2018-11-08 | Discharge: 2018-11-08 | Disposition: A | Discharge status: Home / Self Care | Payer: Self-pay

## 2018-11-17 ENCOUNTER — Other Ambulatory Visit: Payer: Self-pay | Admitting: Cardiology

## 2018-11-17 ENCOUNTER — Encounter: Payer: Self-pay | Admitting: Cardiology

## 2018-11-17 MED ORDER — SIMVASTATIN 20 MG PO TABS: ORAL_TABLET | ORAL | 0 refills | Status: DC

## 2018-11-17 NOTE — Telephone Encounter (Signed)
Pt's medication was sent to pt's pharmacy as requested. Confirmation received.  °

## 2018-11-17 NOTE — Telephone Encounter (Signed)
 *  STAT* If patient is at the pharmacy, call can be transferred to refill team.   1. Which medications need to be refilled? (please list name of each medication and dose if known) simvastatin (ZOCOR) 20 MG tablet  2. Which pharmacy/location (including street and city if local pharmacy) is medication to be sent to? [facility]   3. Do they need a 30 day or 90 day supply? 90  Patient has appt 02/20/19

## 2019-01-19 ENCOUNTER — Other Ambulatory Visit: Payer: Self-pay | Admitting: Cardiology

## 2019-02-12 NOTE — Progress Notes (Deleted)
HPI: Fu chest pain/CAD. Stress echocardiogram October 2010 normal.  Exercise treadmill June 2019 normal.Calcium score June 2019 2076.  At last office visit September 2019 I recommended cardiac catheterization.  However he was concerned about the cost.  At that time he was to contact as if he was willing to proceed.  Since last seen   Current Outpatient Medications  Medication Sig Dispense Refill  . ALPRAZolam (XANAX) 1 MG tablet Take 1 tablet by mouth Once daily as needed for anxiety.     Marland Kitchen amLODipine (NORVASC) 10 MG tablet Take 10 mg by mouth daily.    Marland Kitchen aspirin EC 81 MG tablet Take 81 mg by mouth daily.    . fish oil-omega-3 fatty acids 1000 MG capsule Take 2 g by mouth daily.      . metoprolol succinate (TOPROL-XL) 50 MG 24 hr tablet Take 1 tablet (50 mg total) by mouth daily. Take with or immediately following a meal. 90 tablet 3  . Multiple Vitamin (MULTIVITAMIN) capsule Take 1 capsule by mouth daily.      . sertraline (ZOLOFT) 100 MG tablet Take 100 mg by mouth daily.      . simvastatin (ZOCOR) 20 MG tablet TAKE ONE TABLET BY MOUTH DAILY AT BEDTIME  *please keep appointment for future refills* 90 tablet 0   No current facility-administered medications for this visit.      Past Medical History:  Diagnosis Date  . ADHD (attention deficit hyperactivity disorder)   . Anxiety state, unspecified   . Bipolar 1 disorder (HCC)   . CHEST PAIN 12/04/2008   Qualifier: Diagnosis of  By: Milinda Antis MD, Colon Flattery   . Depressive disorder, not elsewhere classified   . Dermatophytosis of nail   . Esophageal reflux   . HYPERGLYCEMIA 07/10/2007   Qualifier: Diagnosis of  By: Milinda Antis MD, Colon Flattery   . Kidney stones 09/22/2010  . Low back pain 09/18/2013  . Other chronic nonalcoholic liver disease   . Problems with hearing   . Prostatitis, unspecified   . Pure hypercholesterolemia   . Screen for STD (sexually transmitted disease) 04/20/2011  . Sprain of cruciate ligament of knee   . Tear of medial  cartilage or meniscus of knee, current   . TMJ (temporomandibular joint disorder) 12/25/2010  . Unspecified essential hypertension   . Unspecified gastritis and gastroduodenitis without mention of hemorrhage   . Urethritis, unspecified     Past Surgical History:  Procedure Laterality Date  . No prior surgery      Social History   Socioeconomic History  . Marital status: Single    Spouse name: Not on file  . Number of children: 0  . Years of education: Not on file  . Highest education level: Not on file  Occupational History  . Occupation: Owns Physicist, medical  Social Needs  . Financial resource strain: Not on file  . Food insecurity    Worry: Not on file    Inability: Not on file  . Transportation needs    Medical: Not on file    Non-medical: Not on file  Tobacco Use  . Smoking status: Never Smoker  . Smokeless tobacco: Never Used  Substance and Sexual Activity  . Alcohol use: Yes    Comment: rare  . Drug use: No  . Sexual activity: Not on file  Lifestyle  . Physical activity    Days per week: Not on file    Minutes per session: Not on file  .  Stress: Not on file  Relationships  . Social Herbalist on phone: Not on file    Gets together: Not on file    Attends religious service: Not on file    Active member of club or organization: Not on file    Attends meetings of clubs or organizations: Not on file    Relationship status: Not on file  . Intimate partner violence    Fear of current or ex partner: Not on file    Emotionally abused: Not on file    Physically abused: Not on file    Forced sexual activity: Not on file  Other Topics Concern  . Not on file  Social History Narrative  . Not on file    Family History  Problem Relation Age of Onset  . Diabetes Father   . Hypertension Father   . Heart failure Father     ROS: no fevers or chills, productive cough, hemoptysis, dysphasia, odynophagia, melena, hematochezia, dysuria, hematuria, rash,  seizure activity, orthopnea, PND, pedal edema, claudication. Remaining systems are negative.  Physical Exam: Well-developed well-nourished in no acute distress.  Skin is warm and dry.  HEENT is normal.  Neck is supple.  Chest is clear to auscultation with normal expansion.  Cardiovascular exam is regular rate and rhythm.  Abdominal exam nontender or distended. No masses palpated. Extremities show no edema. neuro grossly intact  ECG- personally reviewed  A/P  1 coronary artery disease-previous calcium score 2076.  Continue aspirin and statin.  2 chest pain-  3 hypertension-blood pressure is controlled.  Continue present medications and follow.  4 hyperlipidemia-continue statin.  Check lipids and liver.  Kirk Ruths, MD

## 2019-02-19 ENCOUNTER — Other Ambulatory Visit: Payer: Self-pay | Admitting: Cardiology

## 2019-02-19 NOTE — Telephone Encounter (Signed)
Rx request sent to pharmacy.  

## 2019-02-20 ENCOUNTER — Ambulatory Visit: Payer: Self-pay | Admitting: Cardiology

## 2019-03-19 ENCOUNTER — Other Ambulatory Visit: Payer: Self-pay | Admitting: Cardiology

## 2019-03-24 ENCOUNTER — Other Ambulatory Visit: Payer: Self-pay | Admitting: Cardiology

## 2019-04-17 ENCOUNTER — Other Ambulatory Visit: Payer: Self-pay | Admitting: Cardiology

## 2019-05-21 ENCOUNTER — Other Ambulatory Visit: Payer: Self-pay | Admitting: Cardiology

## 2019-05-22 ENCOUNTER — Other Ambulatory Visit: Payer: Self-pay | Admitting: Cardiology

## 2020-03-20 ENCOUNTER — Emergency Department (HOSPITAL_COMMUNITY): Payer: Self-pay

## 2020-03-20 ENCOUNTER — Encounter (HOSPITAL_COMMUNITY): Payer: Self-pay | Admitting: *Deleted

## 2020-03-20 ENCOUNTER — Other Ambulatory Visit: Payer: Self-pay

## 2020-03-20 ENCOUNTER — Emergency Department (HOSPITAL_COMMUNITY)
Admission: EM | Admit: 2020-03-20 | Discharge: 2020-03-20 | Disposition: A | Discharge status: Home / Self Care | Payer: Self-pay | Attending: Emergency Medicine | Admitting: Emergency Medicine

## 2020-03-20 DIAGNOSIS — R52 Pain, unspecified: Secondary | ICD-10-CM

## 2020-03-20 DIAGNOSIS — M79604 Pain in right leg: Secondary | ICD-10-CM

## 2020-03-20 DIAGNOSIS — R0789 Other chest pain: Secondary | ICD-10-CM | POA: Insufficient documentation

## 2020-03-20 DIAGNOSIS — Z7982 Long term (current) use of aspirin: Secondary | ICD-10-CM | POA: Insufficient documentation

## 2020-03-20 DIAGNOSIS — Z20822 Contact with and (suspected) exposure to covid-19: Secondary | ICD-10-CM | POA: Insufficient documentation

## 2020-03-20 DIAGNOSIS — R2243 Localized swelling, mass and lump, lower limb, bilateral: Secondary | ICD-10-CM | POA: Insufficient documentation

## 2020-03-20 DIAGNOSIS — Z79899 Other long term (current) drug therapy: Secondary | ICD-10-CM | POA: Insufficient documentation

## 2020-03-20 DIAGNOSIS — I10 Essential (primary) hypertension: Secondary | ICD-10-CM | POA: Insufficient documentation

## 2020-03-20 DIAGNOSIS — R079 Chest pain, unspecified: Secondary | ICD-10-CM

## 2020-03-20 LAB — HEPATIC FUNCTION PANEL
ALT: 32 U/L (ref 0–44)
AST: 27 U/L (ref 15–41)
Albumin: 4.8 g/dL (ref 3.5–5.0)
Alkaline Phosphatase: 54 U/L (ref 38–126)
Bilirubin, Direct: 0.1 mg/dL (ref 0.0–0.2)
Indirect Bilirubin: 0.7 mg/dL (ref 0.3–0.9)
Total Bilirubin: 0.8 mg/dL (ref 0.3–1.2)
Total Protein: 7.7 g/dL (ref 6.5–8.1)

## 2020-03-20 LAB — URINALYSIS, ROUTINE W REFLEX MICROSCOPIC
Bacteria, UA: NONE SEEN
Bilirubin Urine: NEGATIVE
Glucose, UA: NEGATIVE mg/dL
Ketones, ur: NEGATIVE mg/dL
Leukocytes,Ua: NEGATIVE
Nitrite: NEGATIVE
Protein, ur: NEGATIVE mg/dL
Specific Gravity, Urine: 1.016 (ref 1.005–1.030)
pH: 5 (ref 5.0–8.0)

## 2020-03-20 LAB — BASIC METABOLIC PANEL
Anion gap: 9 (ref 5–15)
BUN: 16 mg/dL (ref 6–20)
CO2: 26 mmol/L (ref 22–32)
Calcium: 9.6 mg/dL (ref 8.9–10.3)
Chloride: 102 mmol/L (ref 98–111)
Creatinine, Ser: 0.87 mg/dL (ref 0.61–1.24)
GFR, Estimated: >60 mL/min (ref >60)
Glucose, Bld: 90 mg/dL (ref 70–99)
Potassium: 4 mmol/L (ref 3.5–5.1)
Sodium: 137 mmol/L (ref 135–145)

## 2020-03-20 LAB — CBC
HCT: 48.9 % (ref 39.0–52.0)
Hemoglobin: 16.3 g/dL (ref 13.0–17.0)
MCH: 28.1 pg (ref 26.0–34.0)
MCHC: 33.3 g/dL (ref 30.0–36.0)
MCV: 84.2 fL (ref 80.0–100.0)
Platelets: 251 10*3/uL (ref 150–400)
RBC: 5.81 MIL/uL (ref 4.22–5.81)
RDW: 12.4 % (ref 11.5–15.5)
WBC: 6.8 10*3/uL (ref 4.0–10.5)
nRBC: 0 % (ref 0.0–0.2)

## 2020-03-20 LAB — D-DIMER, QUANTITATIVE: D-Dimer, Quant: <0.27 ug/mL-FEU (ref 0.00–0.50)

## 2020-03-20 LAB — LIPASE, BLOOD: Lipase: 42 U/L (ref 11–51)

## 2020-03-20 LAB — TROPONIN I (HIGH SENSITIVITY)
Troponin I (High Sensitivity): 11 ng/L (ref <18)
Troponin I (High Sensitivity): 12 ng/L (ref <18)

## 2020-03-20 LAB — RESP PANEL BY RT-PCR (FLU A&B, COVID) ARPGX2
Influenza A by PCR: NEGATIVE
Influenza B by PCR: NEGATIVE
SARS Coronavirus 2 by RT PCR: NEGATIVE

## 2020-03-20 MED ORDER — NITROGLYCERIN 0.4 MG SL SUBL: 0.4000 mg | SUBLINGUAL_TABLET | SUBLINGUAL | 0 refills | Status: DC | PRN

## 2020-03-20 MED ORDER — ASPIRIN 81 MG PO CHEW: 81.0000 mg | CHEWABLE_TABLET | Freq: Every day | ORAL | 0 refills | Status: DC

## 2020-03-20 MED ORDER — ISOSORBIDE MONONITRATE ER 30 MG PO TB24: 30.0000 mg | ORAL_TABLET | Freq: Every day | ORAL | 0 refills | Status: DC

## 2020-03-20 NOTE — ED Notes (Addendum)
Pt on cell phone. Nad. Pt c/o chest tightness and sob with activity x 2 weeks. Also states his hands get numb intermittently. nad at this time. No sob noted. Nondiaphoretic. Color wnl. Denies radiation of chest tightness from mid chest area. Pt placed on cardiac monitor

## 2020-03-20 NOTE — ED Provider Notes (Signed)
Care assumed from [facility] at shift change.  See his note for full H&P  Per his note, " JACLYN CAREW is a 54 y.o. male history of ADHD, obesity, bipolar disorder, GERD, prediabetic.  Patient presents today with multiple complaints.  Patient's primary concern is chest pain is sharp pressure substernal which has been intermittent for the past 2 weeks he reports pain only occurs when he is exerting himself or walking his dog gradually improves with rest, pain does not radiate.  Pain is mild-moderate in intensity.  Additionally patient is concerned with bilateral lower extremity swelling which is intermittent he reports a small swollen area will appear by his medial calves he reports this resolves without intervention and he is not currently experiencing the swelling.  Additionally patient is concerned for possible kidney stone he reports occasional right-sided abdominal pressure sensation nonradiating resolves after he urinates.  Denies fever/chills, fall/injury, numbness/tingling, weakness, cough/hemoptysis or additional concerns.  Of note patient denies any family history of heart disease under the age of 31, denies smoking history, denies history of blood clot, denies recent surgery/immobilization, denies hemoptysis or additional concerns. "   Physical Exam  BP 127/89   Pulse (!) 55   Temp 98.2 F (36.8 C) (Oral)   Resp 18   Ht 5\' 6"  (1.676 m)   SpO2 96%   BMI 36.48 kg/m   Physical Exam Constitutional:      General: He is not in acute distress.    Appearance: He is well-developed and well-nourished.  Eyes:     Conjunctiva/sclera: Conjunctivae normal.  Cardiovascular:     Rate and Rhythm: Normal rate and regular rhythm.  Pulmonary:     Effort: Pulmonary effort is normal.     Breath sounds: Normal breath sounds.  Skin:    General: Skin is warm and dry.  Neurological:     Mental Status: He is alert and oriented to person, place, and time.       ED  Course/Procedures   Clinical Course as of 03/20/20 2117  Thu Mar 20, 2020  1851 Dr. Mar 22, 2020; pending negative delta troponin. Nitro tablets SL, baby aspirin, imdur 30mg , continue metoprolol and statin. Follow-up next week [BM]    Clinical Course User Index [BM] Darryl Nestle, PA-C    Procedures  Results for orders placed or performed during the hospital encounter of 03/20/20  Resp Panel by RT-PCR (Flu A&B, Covid) Nasopharyngeal Swab   Specimen: Nasopharyngeal Swab; Nasopharyngeal(NP) swabs in vial transport medium  Result Value Ref Range   SARS Coronavirus 2 by RT PCR NEGATIVE NEGATIVE   Influenza A by PCR NEGATIVE NEGATIVE   Influenza B by PCR NEGATIVE NEGATIVE  Basic metabolic panel  Result Value Ref Range   Sodium 137 135 - 145 mmol/L   Potassium 4.0 3.5 - 5.1 mmol/L   Chloride 102 98 - 111 mmol/L   CO2 26 22 - 32 mmol/L   Glucose, Bld 90 70 - 99 mg/dL   BUN 16 6 - 20 mg/dL   Creatinine, Ser Bill Salinas 0.61 - 1.24 mg/dL   Calcium 9.6 8.9 - 03/22/20 mg/dL   GFR, Estimated 0.93 23.5 mL/min   Anion gap 9 5 - 15  CBC  Result Value Ref Range   WBC 6.8 4.0 - 10.5 K/uL   RBC 5.81 4.22 - 5.81 MIL/uL   Hemoglobin 16.3 13.0 - 17.0 g/dL   HCT >57 >32 - 20.2 %   MCV 84.2 80.0 - 100.0 fL   MCH 28.1 26.0 -  34.0 pg   MCHC 33.3 30.0 - 36.0 g/dL   RDW 20.9 47.0 - 96.2 %   Platelets 251 150 - 400 K/uL   nRBC 0.0 0.0 - 0.2 %  D-dimer, quantitative  Result Value Ref Range   D-Dimer, Quant <0.27 0.00 - 0.50 ug/mL-FEU  Urinalysis, Routine w reflex microscopic Urine, Clean Catch  Result Value Ref Range   Color, Urine YELLOW YELLOW   APPearance CLEAR CLEAR   Specific Gravity, Urine 1.016 1.005 - 1.030   pH 5.0 5.0 - 8.0   Glucose, UA NEGATIVE NEGATIVE mg/dL   Hgb urine dipstick SMALL (A) NEGATIVE   Bilirubin Urine NEGATIVE NEGATIVE   Ketones, ur NEGATIVE NEGATIVE mg/dL   Protein, ur NEGATIVE NEGATIVE mg/dL   Nitrite NEGATIVE NEGATIVE   Leukocytes,Ua NEGATIVE NEGATIVE   RBC / HPF 0-5 0  - 5 RBC/hpf   WBC, UA 0-5 0 - 5 WBC/hpf   Bacteria, UA NONE SEEN NONE SEEN   Mucus PRESENT   Lipase, blood  Result Value Ref Range   Lipase 42 11 - 51 U/L  Hepatic function panel  Result Value Ref Range   Total Protein 7.7 6.5 - 8.1 g/dL   Albumin 4.8 3.5 - 5.0 g/dL   AST 27 15 - 41 U/L   ALT 32 0 - 44 U/L   Alkaline Phosphatase 54 38 - 126 U/L   Total Bilirubin 0.8 0.3 - 1.2 mg/dL   Bilirubin, Direct 0.1 0.0 - 0.2 mg/dL   Indirect Bilirubin 0.7 0.3 - 0.9 mg/dL  Troponin I (High Sensitivity)  Result Value Ref Range   Troponin I (High Sensitivity) 11 <18 ng/L  Troponin I (High Sensitivity)  Result Value Ref Range   Troponin I (High Sensitivity) 12 <18 ng/L   US Venous Img Lower Bilateral (DVT)  Result Date: 03/20/2020 CLINICAL DATA:  Bilateral lower extremity edema and discomfort for 1 week. EXAM: BILATERAL LOWER EXTREMITY VENOUS DOPPLER ULTRASOUND TECHNIQUE: Gray-scale sonography with compression, as well as color and duplex ultrasound, were performed to evaluate the deep venous system(s) from the level of the common femoral vein through the popliteal and proximal calf veins. COMPARISON:  None. FINDINGS: VENOUS Normal compressibility of the common femoral, superficial femoral, and popliteal veins, as well as the visualized calf veins. Visualized portions of profunda femoral vein and great saphenous vein unremarkable. No filling defects to suggest DVT on grayscale or color Doppler imaging. Doppler waveforms show normal direction of venous flow, normal respiratory plasticity and response to augmentation. OTHER None. Limitations: none IMPRESSION: No evidence of bilateral lower extremity DVT. Electronically Signed   By: Narda Rutherford M.D.   On: 03/20/2020 17:57   DG Chest Port 1 View  Result Date: 03/20/2020 CLINICAL DATA:  Left-sided chest pain, sickness for 2 weeks EXAM: PORTABLE CHEST 1 VIEW COMPARISON:  None. FINDINGS: The heart size and mediastinal contours are within normal  limits. Both lungs are clear. The visualized skeletal structures are unremarkable. IMPRESSION: No active disease. Electronically Signed   By: Sharlet Salina M.D.   On: 03/20/2020 17:04     MDM   54 y/o M presenting to the ED today for eval of mult complaints. Thus far w/u has been reassuring. At shift change, he is pending a delta trop. Cardiology has been consulted by prior provider and it was recommended that if his delta trop was negative he was felt to be safe for discharge and the cardiology office will reach out to him to schedule an appointment early next  week.  They recommended starting him on sublingual nitro, aspirin, Imdur.  Patient's repeat troponin is negative.  As above, EKG reassuring, chest x-ray negative and easy noted stress here in the emergency department.  Discussed plan for discharge and follow-up with cardiology.  He voices understanding and is in agreement with the plan.  All questions answered.  Patient stable for discharge.    Rayne Du 03/20/20 2117    Derwood Kaplan, MD 03/21/20 [phone]

## 2020-03-20 NOTE — ED Provider Notes (Signed)
[facility] EMERGENCY DEPARTMENT Provider Note   CSN: [id number] Arrival date & time: 03/20/20  1032     History Chief Complaint  Patient presents with  . Leg Pain    Glenn Stewart is a 54 y.o. male history of ADHD, obesity, bipolar disorder, GERD, prediabetic.  Patient presents today with multiple complaints.  Patient's primary concern is chest pain is sharp pressure substernal which has been intermittent for the past 2 weeks he reports pain only occurs when he is exerting himself or walking his dog gradually improves with rest, pain does not radiate.  Pain is mild-moderate in intensity.  Additionally patient is concerned with bilateral lower extremity swelling which is intermittent he reports a small swollen area will appear by his medial calves he reports this resolves without intervention and he is not currently experiencing the swelling.  Additionally patient is concerned for possible kidney stone he reports occasional right-sided abdominal pressure sensation nonradiating resolves after he urinates.  Denies fever/chills, fall/injury, numbness/tingling, weakness, cough/hemoptysis or additional concerns.  Of note patient denies any family history of heart disease under the age of 7, denies smoking history, denies history of blood clot, denies recent surgery/immobilization, denies hemoptysis or additional concerns. HPI     Past Medical History:  Diagnosis Date  . ADHD (attention deficit hyperactivity disorder)   . Anxiety state, unspecified   . Bipolar 1 disorder (HCC)   . CHEST PAIN 12/04/2008   Qualifier: Diagnosis of  By: Milinda Antis MD, Colon Flattery   . Depressive disorder, not elsewhere classified   . Dermatophytosis of nail   . Esophageal reflux   . HYPERGLYCEMIA 07/10/2007   Qualifier: Diagnosis of  By: Milinda Antis MD, Colon Flattery   . Kidney stones 09/22/2010  . Low back pain 09/18/2013  . Other chronic nonalcoholic liver disease   . Problems with hearing   . Prostatitis,  unspecified   . Pure hypercholesterolemia   . Screen for STD (sexually transmitted disease) 04/20/2011  . Sprain of cruciate ligament of knee   . Tear of medial cartilage or meniscus of knee, current   . TMJ (temporomandibular joint disorder) 12/25/2010  . Unspecified essential hypertension   . Unspecified gastritis and gastroduodenitis without mention of hemorrhage   . Urethritis, unspecified     Patient Active Problem List   Diagnosis Date Noted  . Low back pain 09/18/2013  . Screen for STD (sexually transmitted disease) 04/20/2011  . TMJ (temporomandibular joint disorder) 12/25/2010  . Kidney stones 09/22/2010  . Routine general medical examination at a health care facility 07/22/2010  . TINEA VERSICOLOR 10/29/2009  . CHEST PAIN 12/04/2008  . MEDIAL MENISCUS TEAR, LEFT 05/31/2008  . ACL TEAR, LEFT KNEE 05/31/2008  . Pain in joint, forearm 05/28/2008  . PROBLEMS WITH HEARING 03/18/2008  . HYPERGLYCEMIA 07/10/2007  . DERMATOPHYTOSIS OF NAIL 04/10/2007  . HYPERCHOLESTEROLEMIA 09/06/2006  . ANXIETY 09/06/2006  . DEPRESSION 09/06/2006  . HYPERTENSION 09/06/2006  . GERD 09/06/2006  . FATTY LIVER DISEASE 09/06/2006    Past Surgical History:  Procedure Laterality Date  . No prior surgery         Family History  Problem Relation Age of Onset  . Diabetes Father   . Hypertension Father   . Heart failure Father     Social History   Tobacco Use  . Smoking status: Never Smoker  . Smokeless tobacco: Never Used  Vaping Use  . Vaping Use: Never used  Substance Use Topics  . Alcohol use: Yes  Comment: rare  . Drug use: No    Home Medications Prior to Admission medications   Medication Sig Start Date End Date Taking? Authorizing Provider  ALPRAZolam Prudy Feeler) 1 MG tablet Take 1 tablet by mouth Once daily as needed for anxiety.  04/17/11   [provider]  amLODipine (NORVASC) 10 MG tablet Take 10 mg by mouth daily.    [provider]  aspirin EC 81 MG  tablet Take 81 mg by mouth daily.    [provider]  fish oil-omega-3 fatty acids 1000 MG capsule Take 2 g by mouth daily.      [provider]  metoprolol succinate (TOPROL-XL) 50 MG 24 hr tablet Take 1 tablet (50 mg total) by mouth daily. Take with or immediately following a meal. 11/28/17 02/26/18  Lewayne Bunting, MD  Multiple Vitamin (MULTIVITAMIN) capsule Take 1 capsule by mouth daily.      [provider]  sertraline (ZOLOFT) 100 MG tablet Take 100 mg by mouth daily.      [provider]  simvastatin (ZOCOR) 20 MG tablet TAKE ONE TABLET BY MOUTH DAILY AT BEDTIME *OFFICE VISIT NEEDED FOR FURTHER REFILLS* 05/21/19   Lewayne Bunting, MD    Allergies    Sulfonamide derivatives  Review of Systems   Review of Systems Ten systems are reviewed and are negative for acute change except as noted in the HPI  Physical Exam Updated Vital Signs BP (!) 135/96 (BP Location: Left Arm)   Pulse 79   Temp 98.2 F (36.8 C) (Oral)   Resp 18   Ht 5\' 6"  (1.676 m)   SpO2 98%   BMI 36.48 kg/m   Physical Exam Constitutional:      General: He is not in acute distress.    Appearance: Normal appearance. He is well-developed. He is not ill-appearing or diaphoretic.  HENT:     Head: Normocephalic and atraumatic.  Eyes:     General: Vision grossly intact. Gaze aligned appropriately.     Pupils: Pupils are equal, round, and reactive to light.  Neck:     Trachea: Trachea and phonation normal.  Cardiovascular:     Rate and Rhythm: Normal rate and regular rhythm.     Pulses: Normal pulses.          Dorsalis pedis pulses are 2+ on the right side and 2+ on the left side.  Pulmonary:     Effort: Pulmonary effort is normal. No respiratory distress.     Breath sounds: Normal breath sounds.  Abdominal:     General: There is no distension.     Palpations: Abdomen is soft.     Tenderness: There is no abdominal tenderness. There is no guarding or rebound.  Musculoskeletal:         General: Normal range of motion.     Cervical back: Normal range of motion.     Right lower leg: No edema.     Left lower leg: No edema.  Feet:     Right foot:     Protective Sensation: 3 sites tested. 3 sites sensed.     Left foot:     Protective Sensation: 3 sites tested. 3 sites sensed.  Skin:    General: Skin is warm and dry.  Neurological:     Mental Status: He is alert.     GCS: GCS eye subscore is 4. GCS verbal subscore is 5. GCS motor subscore is 6.     Comments: Speech is clear  and goal oriented, follows commands Major Cranial nerves without deficit, no facial droop Moves extremities without ataxia, coordination intact  Psychiatric:        Behavior: Behavior normal.     ED Results / Procedures / Treatments   Labs (all labs ordered are listed, but only abnormal results are displayed) Labs Reviewed - No data to display  EKG None  Radiology No results found.  Procedures Procedures (including critical care time)  Medications Ordered in ED Medications - No data to display  ED Course  I have reviewed the triage vital signs and the nursing notes.  Pertinent labs & imaging results that were available during my care of the patient were reviewed by me and considered in my medical decision making (see chart for details).  Clinical Course as of 03/20/20 1907  Thu Mar 20, 2020  1851 Dr. Darryl Nestle; pending negative delta troponin. Nitro tablets SL, baby aspirin, imdur 30mg , continue metoprolol and statin. Follow-up next week [BM]    Clinical Course User Index [BM]   MDM Rules/Calculators/A&P                         Additional history obtained from: 1. Nursing notes from this visit. 2. Review of electronic medical record.  Patient saw cardiology in June in September 2019.  CT cardiac calcium score 2076.  It appears Dr. 2077 recommended patient undergo cardiac catheterization for concern of angina but patient never followed up on  recommendation. ------------ I ordered, reviewed and interpreted labs which include: CBC within normal limits, no leukocytosis to suggest bacterial infection, no anemia. Lipase within normal limits, doubt pancreatitis. LFTs within normal limits, no evidence for biliary disease, no abdominal tenderness on exam. Covid/influenza panel negative. BMP within normal limits, no emergent electrolyte derangement, AKI or gap. D-dimer negative, doubt PE. Initial troponin within normal limits, reassuring.  Bilateral ultrasound DVT study:  IMPRESSION:  No evidence of bilateral lower extremity DVT.   CXR:  IMPRESSION:  No active disease.  -------------- 6:51 PM: Consult called to cardiologist Dr. Jens Som, recommends obtaining a delta troponin.  If negative patient safe for discharge.  Recommends prescribing patient nitro sublingual tablets, starting patient on baby aspirin daily and starting patient on Imdur 30 mg daily.  Patient is to continue his metoprolol and statin.  Dr. Darryl Nestle sending message to his clinic to schedule patient for follow-up appointment early next week and to plan cath following.   At care handoff delta troponin and urinalysis are pending. Per cardiology recommendations anticipate discharge pending no acute findings.   Final disposition per oncoming team.   Note: Portions of this report may have been transcribed using voice recognition software. Every effort was made to ensure accuracy; however, inadvertent computerized transcription errors may still be present. Final Clinical Impression(s) / ED Diagnoses Final diagnoses:  None    Rx / DC Orders ED Discharge Orders    None       Darryl Nestle 03/20/20 1910    03/22/20, MD 03/21/20 [id number]

## 2020-03-20 NOTE — Progress Notes (Signed)
Received call from [name] concerning patient.  Glenn Stewart is a 54 year old male with known CAD (calcium score 2076 in June 2019) who presents with exertional chest pain.  Followed with Dr. Jens Som, last seen in 2019.  Cardiac catheterization was recommended at that time but he declined as he was concerned about the expense due to lack of insurance.  Presented to ED today with exertional chest pain.  Description sounds consistent with stable angina.  Given known significantly elevated calcium score, agree that he needs cardiac catheterization.  However, cath would not be done until Monday given the holiday weekend.  First troponin is negative.  If second troponin elevated, would admit to [facility] for NSTEMI.  If second troponin negative, can discharge with close follow-up.  If discharging, would continue Toprol-XL 50 mg daily and add Imdur 30 mg daily.  Continue aspirin and statin.  Would discharge with sublingual nitroglycerin.  Instruct patient that should he develop chest pain lasting more than 5 minutes, should take sublingual nitroglycerin.  If pain does not resolve after second sublingual nitroglycerin, should call EMS.  I will send message to schedulers to have patient seen in clinic this week.

## 2020-03-20 NOTE — ED Triage Notes (Signed)
Patient has many complaints and has been feeling sick for 2 weeks

## 2020-03-20 NOTE — Discharge Instructions (Signed)
Your cardiac work-up today was reassuring.  You will be started on some medications for home.  The cardiology team will reach out to you to schedule an appointment for follow-up.  The remainder of your work-up was reassuring.  Please monitor symptoms closely.  If you have any new or worsening symptoms in the meantime please return the emergency department immediately.

## 2020-03-31 ENCOUNTER — Telehealth: Payer: Self-pay | Admitting: Family Medicine

## 2020-03-31 NOTE — Telephone Encounter (Signed)
Patient scheduled appointment on 04/03/20 at 11:00.

## 2020-03-31 NOTE — Telephone Encounter (Signed)
That is ok 

## 2020-03-31 NOTE — Telephone Encounter (Signed)
Patient called in to schedule follow up from [facility]. Pt last seen in 2015. Would it be ok to re-establish ?

## 2020-04-01 ENCOUNTER — Encounter: Payer: Self-pay | Admitting: Cardiology

## 2020-04-01 ENCOUNTER — Other Ambulatory Visit: Payer: Self-pay

## 2020-04-01 ENCOUNTER — Ambulatory Visit (INDEPENDENT_AMBULATORY_CARE_PROVIDER_SITE_OTHER): Payer: Self-pay | Admitting: Cardiology

## 2020-04-01 DIAGNOSIS — I2510 Atherosclerotic heart disease of native coronary artery without angina pectoris: Secondary | ICD-10-CM | POA: Insufficient documentation

## 2020-04-01 DIAGNOSIS — E785 Hyperlipidemia, unspecified: Secondary | ICD-10-CM

## 2020-04-01 DIAGNOSIS — I10 Essential (primary) hypertension: Secondary | ICD-10-CM

## 2020-04-01 DIAGNOSIS — R079 Chest pain, unspecified: Secondary | ICD-10-CM

## 2020-04-01 MED ORDER — METOPROLOL SUCCINATE ER 25 MG PO TB24: 25.0000 mg | ORAL_TABLET | Freq: Every day | ORAL | 6 refills | Status: DC

## 2020-04-01 MED ORDER — ISOSORBIDE MONONITRATE ER 30 MG PO TB24: 30.0000 mg | ORAL_TABLET | Freq: Every day | ORAL | 3 refills | Status: DC

## 2020-04-01 NOTE — Assessment & Plan Note (Signed)
Controlled- not sure why he wasn't taking Toprol 50 mg daily- I will start this at 25 mg daily.

## 2020-04-01 NOTE — Patient Instructions (Signed)
Medication Instructions:  RESTART- Metoprolol succinate 25 mg by mouth daily  *If you need a refill on your cardiac medications before your next appointment, please call your pharmacy*   Lab Work: None Ordered   Testing/Procedures: None Ordered   Follow-Up: At [facility], you and your health needs are our priority.  As part of our continuing mission to provide you with exceptional heart care, we have created designated Provider Care Teams.  These Care Teams include your primary Cardiologist (physician) and Advanced Practice Providers (APPs -  Physician Assistants and Nurse Practitioners) who all work together to provide you with the care you need, when you need it.  We recommend signing up for the patient portal called "MyChart".  Sign up information is provided on this After Visit Summary.  MyChart is used to connect with patients for Virtual Visits (Telemedicine).  Patients are able to view lab/test results, encounter notes, upcoming appointments, etc.  Non-urgent messages can be sent to your provider as well.   To learn more about what you can do with MyChart, go to [email or web address].    Your next appointment:   4-6 week(s)  The format for your next appointment:   In Person  Provider:   Dr Jens Som or Corine Shelter

## 2020-04-01 NOTE — Assessment & Plan Note (Signed)
His symptoms when seen 03/20/2020 did not sound like angina.  He is currently doing well.  Continue current Rx.  If he develops any exertional chest pain or increased symptoms he will need diagnostic cath.

## 2020-04-01 NOTE — Progress Notes (Signed)
Cardiology Office Note:    Date:  04/01/2020   ID:  Glenn Stewart, DOB 1965/08/03, MRN [id number]  PCP:  Judy Pimple, MD  Cardiologist:  Dr Jens Som Electrophysiologist:  None   Referring MD: Judy Pimple, MD   CC: chest pain  History of Present Illness:    Glenn Stewart is a 54 y.o. male who is HOH and has a hx of chest pain, hypertension, dyslipidemia, ADHD, and a past history of bipolar disorder.  He has been evaluated for chest pain in the past.  He had a negative stress echo in 2010.  Coronary CT scoring in 2016 showed a calcium score of 2076 which put him in the 99th percentile.  At that time coronary angiogram was recommended.  The patient was concerned about cost then and he underwent a treadmill which was low risk.  He has actually done pretty well since then.  He was seen in the emergency room 03/20/2020 with complaints of left-sided chest pain.  He describes a vague ache on the left side of his chest.  He also had some unusual symptoms including swelling on his left calf (D-dimer and venous dopplers negative) and tingling in his right wrist.  His troponin and EKG were negative.  He was placed on isosorbide mononitrate.  He said since then he has changed his diet.  He says actually he feels much better now.  He is walking without problems.  He is not having any chest pain.  I reviewed his symptoms he described leading up to his ED visit. His symptoms never were exertional.  I reviewed his medications with him in detail, turns out he is not taking his Toprol, he just never started this.  Past Medical History:  Diagnosis Date  . ADHD (attention deficit hyperactivity disorder)   . Anxiety state, unspecified   . Bipolar 1 disorder (HCC)   . CHEST PAIN 12/04/2008   Qualifier: Diagnosis of  By: Milinda Antis MD, Colon Flattery   . Depressive disorder, not elsewhere classified   . Dermatophytosis of nail   . Esophageal reflux   . HYPERGLYCEMIA 07/10/2007   Qualifier: Diagnosis of  By:  Milinda Antis MD, Colon Flattery   . Kidney stones 09/22/2010  . Low back pain 09/18/2013  . Other chronic nonalcoholic liver disease   . Problems with hearing   . Prostatitis, unspecified   . Pure hypercholesterolemia   . Screen for STD (sexually transmitted disease) 04/20/2011  . Sprain of cruciate ligament of knee   . Tear of medial cartilage or meniscus of knee, current   . TMJ (temporomandibular joint disorder) 12/25/2010  . Unspecified essential hypertension   . Unspecified gastritis and gastroduodenitis without mention of hemorrhage   . Urethritis, unspecified     Past Surgical History:  Procedure Laterality Date  . No prior surgery      Current Medications: Current Meds  Medication Sig  . ALPRAZolam (XANAX) 1 MG tablet Take 1 tablet by mouth Once daily as needed for anxiety.   Marland Kitchen amLODipine (NORVASC) 10 MG tablet Take 10 mg by mouth daily.  Marland Kitchen aspirin 81 MG chewable tablet Chew 1 tablet (81 mg total) by mouth daily.  . fish oil-omega-3 fatty acids 1000 MG capsule Take 2 g by mouth daily.  . metoprolol succinate (TOPROL XL) 25 MG 24 hr tablet Take 1 tablet (25 mg total) by mouth daily.  . Multiple Vitamin (MULTIVITAMIN) capsule Take 1 capsule by mouth daily.  . nitroGLYCERIN (NITROSTAT) 0.4 MG SL  tablet Place 1 tablet (0.4 mg total) under the tongue every 5 (five) minutes as needed for chest pain.  Marland Kitchen sertraline (ZOLOFT) 100 MG tablet Take 100 mg by mouth daily.  . simvastatin (ZOCOR) 20 MG tablet TAKE ONE TABLET BY MOUTH DAILY AT BEDTIME *OFFICE VISIT NEEDED FOR FURTHER REFILLS*  . [DISCONTINUED] isosorbide mononitrate (IMDUR) 30 MG 24 hr tablet Take 1 tablet (30 mg total) by mouth daily.     Allergies:   Sulfonamide derivatives   Social History   Socioeconomic History  . Marital status: Single    Spouse name: Not on file  . Number of children: 0  . Years of education: Not on file  . Highest education level: Not on file  Occupational History  . Occupation: Owns Physicist, medical   Tobacco Use  . Smoking status: Never Smoker  . Smokeless tobacco: Never Used  Vaping Use  . Vaping Use: Never used  Substance and Sexual Activity  . Alcohol use: Yes    Comment: rare  . Drug use: No  . Sexual activity: Not on file  Other Topics Concern  . Not on file  Social History Narrative  . Not on file   Social Determinants of Health   Financial Resource Strain: Not on file  Food Insecurity: Not on file  Transportation Needs: Not on file  Physical Activity: Not on file  Stress: Not on file  Social Connections: Not on file     Family History: The patient's family history includes Diabetes in his father; Heart failure in his father; Hypertension in his father.  ROS:   Please see the history of present illness.     All other systems reviewed and are negative.  EKGs/Labs/Other Studies Reviewed:    The following studies were reviewed today: GXT 2017-09-17-  The patient walked for 7 minutes and 57 seconds of a standard Bruce protocol treadmill test. He achieved a peak heart rate of 144 which is 86% predicted maximal heart rate.  At peak exercise there were no ST or T wave changes. Blood pressure response to exercise was normal.  This is interpreted as a negative stress test.     EKG:  EKG is not ordered today.  The ekg ordered 03/20/2020 demonstrates NSR-HR 60  Recent Labs: 03/20/2020: ALT 32; BUN 16; Creatinine, Ser 0.87; Hemoglobin 16.3; Platelets 251; Potassium 4.0; Sodium 137  Recent Lipid Panel    Component Value Date/Time   CHOL 200 09/18/2013 1102   TRIG 315.0 (H) 09/18/2013 1102   HDL 46.70 09/18/2013 1102   CHOLHDL 4 09/18/2013 1102   VLDL 63.0 (H) 09/18/2013 1102   LDLCALC 90 09/18/2013 1102   LDLDIRECT 117.5 04/10/2012 1012    Physical Exam:    VS:  BP 137/82   Pulse 83   Ht 5\' 6"  (1.676 m)   Wt 209 lb (94.8 kg)   SpO2 95%   BMI 33.73 kg/m     Wt Readings from Last 3 Encounters:  04/01/20 209 lb (94.8 kg)  11/28/17 226 lb (102.5 kg)   08/16/17 228 lb 3.2 oz (103.5 kg)     GEN: Overweight male,  well developed in no acute distress HEENT: Normal- HOH- hearing aids in place NECK: No JVD; No carotid bruits CARDIAC: RRR, no murmurs, rubs, gallops RESPIRATORY:  Clear to auscultation without rales, wheezing or rhonchi  ABDOMEN: Soft, non-tender, non-distended MUSCULOSKELETAL:  No edema; No deformity  SKIN: Warm and dry NEUROLOGIC:  Alert and oriented x 3 PSYCHIATRIC:  Normal affect  ASSESSMENT:    Chest pain of uncertain etiology His symptoms when seen 03/20/2020 did not sound like angina.  He is currently doing well.  Continue current Rx.  If he develops any exertional chest pain or increased symptoms he will need diagnostic cath.   CAD (coronary artery disease) Suspected CAD with coronary Ca++ of 2076- 99th percentile in 2016 Negative GXT 2016  Essential hypertension Controlled- not sure why he wasn't taking Toprol 50 mg daily- I will start this at 25 mg daily.  Dyslipidemia, goal LDL below 70 He is on statin Rx -PCP following  PLAN:    Start Toprol 25 mg daily.  F/U 4-6 weeks to make sure he continue to do well.  Consider cath if he develops any chest pain or exertional symptoms.    Medication Adjustments/Labs and Tests Ordered: Current medicines are reviewed at length with the patient today.  Concerns regarding medicines are outlined above.  No orders of the defined types were placed in this encounter.  Meds ordered this encounter  Medications  . metoprolol succinate (TOPROL XL) 25 MG 24 hr tablet    Sig: Take 1 tablet (25 mg total) by mouth daily.    Dispense:  30 tablet    Refill:  6  . isosorbide mononitrate (IMDUR) 30 MG 24 hr tablet    Sig: Take 1 tablet (30 mg total) by mouth daily.    Dispense:  90 tablet    Refill:  3    Patient Instructions  Medication Instructions:  RESTART- Metoprolol succinate 25 mg by mouth daily  *If you need a refill on your cardiac medications before your next  appointment, please call your pharmacy*   Lab Work: None Ordered   Testing/Procedures: None Ordered   Follow-Up: At [facility], you and your health needs are our priority.  As part of our continuing mission to provide you with exceptional heart care, we have created designated Provider Care Teams.  These Care Teams include your primary Cardiologist (physician) and Advanced Practice Providers (APPs -  Physician Assistants and Nurse Practitioners) who all work together to provide you with the care you need, when you need it.  We recommend signing up for the patient portal called "MyChart".  Sign up information is provided on this After Visit Summary.  MyChart is used to connect with patients for Virtual Visits (Telemedicine).  Patients are able to view lab/test results, encounter notes, upcoming appointments, etc.  Non-urgent messages can be sent to your provider as well.   To learn more about what you can do with MyChart, go to [email or web address].    Your next appointment:   4-6 week(s)  The format for your next appointment:   In Person  Provider:   Dr Jens Som or Corine Shelter        Signed, Corine Shelter, [place]  04/01/2020 2:17 PM    [facility]

## 2020-04-01 NOTE — Assessment & Plan Note (Signed)
Suspected CAD with coronary Ca++ of 2076- 99th percentile in 2016 Negative GXT 2016

## 2020-04-01 NOTE — Assessment & Plan Note (Signed)
He is on statin Rx -PCP following

## 2020-04-03 ENCOUNTER — Ambulatory Visit (INDEPENDENT_AMBULATORY_CARE_PROVIDER_SITE_OTHER): Payer: Self-pay | Admitting: Family Medicine

## 2020-04-03 ENCOUNTER — Other Ambulatory Visit: Payer: Self-pay

## 2020-04-03 ENCOUNTER — Encounter: Payer: Self-pay | Admitting: Family Medicine

## 2020-04-03 VITALS — BP 132/86 | HR 73 | Temp 96.9°F | Ht 66.0 in | Wt 205.2 lb

## 2020-04-03 DIAGNOSIS — Z125 Encounter for screening for malignant neoplasm of prostate: Secondary | ICD-10-CM | POA: Insufficient documentation

## 2020-04-03 DIAGNOSIS — R7303 Prediabetes: Secondary | ICD-10-CM

## 2020-04-03 DIAGNOSIS — R202 Paresthesia of skin: Secondary | ICD-10-CM | POA: Insufficient documentation

## 2020-04-03 DIAGNOSIS — R319 Hematuria, unspecified: Secondary | ICD-10-CM

## 2020-04-03 DIAGNOSIS — R350 Frequency of micturition: Secondary | ICD-10-CM | POA: Insufficient documentation

## 2020-04-03 DIAGNOSIS — I10 Essential (primary) hypertension: Secondary | ICD-10-CM

## 2020-04-03 DIAGNOSIS — N200 Calculus of kidney: Secondary | ICD-10-CM

## 2020-04-03 DIAGNOSIS — I2510 Atherosclerotic heart disease of native coronary artery without angina pectoris: Secondary | ICD-10-CM

## 2020-04-03 LAB — POC URINALSYSI DIPSTICK (AUTOMATED)
Bilirubin, UA: NEGATIVE
Blood, UA: 200
Glucose, UA: NEGATIVE
Ketones, UA: NEGATIVE
Leukocytes, UA: NEGATIVE
Nitrite, UA: NEGATIVE
Protein, UA: POSITIVE — AB
Spec Grav, UA: >=1.03 — AB (ref 1.010–1.025)
Urobilinogen, UA: 0.2 E.U./dL
pH, UA: 5.5 (ref 5.0–8.0)

## 2020-04-03 LAB — LIPID PANEL
Cholesterol: 140 mg/dL (ref 0–200)
HDL: 39.7 mg/dL (ref >39.00)
LDL Cholesterol: 72 mg/dL (ref 0–99)
NonHDL: 100.57
Total CHOL/HDL Ratio: 4
Triglycerides: 145 mg/dL (ref 0.0–149.0)
VLDL: 29 mg/dL (ref 0.0–40.0)

## 2020-04-03 LAB — HEMOGLOBIN A1C: Hgb A1c MFr Bld: 5.9 % (ref 4.6–6.5)

## 2020-04-03 LAB — PSA: PSA: 4.01 ng/mL — ABNORMAL HIGH (ref 0.10–4.00)

## 2020-04-03 NOTE — Progress Notes (Signed)
Subjective:    Patient ID: Glenn Stewart, male    DOB: Aug 15, 1965, 55 y.o.   MRN: [id number]  This visit occurred during the SARS-CoV-2 public health emergency.  Safety protocols were in place, including screening questions prior to the visit, additional usage of staff PPE, and extensive cleaning of exam room while observing appropriate contact time as indicated for disinfecting solutions.    HPI 55 yo pt presents to re establish care   Wt Readings from Last 3 Encounters:  04/03/20 205 lb 3 oz (93.1 kg)  04/01/20 209 lb (94.8 kg)  11/28/17 226 lb (102.5 kg)  cut out sugar and eating much better!  Walking for exercise - plans to get back in the gym  33.12 kg/m  covid status- no documented case (perhaps had in the beginning)  Not sure if he will get immunized   Does not take flu shots   Non smoker  No etoh   Likes sweets but cut that out  Lost 20 lb eating much better (sugar made him feel bad)    C/o urinary freq /hematuria  Frequent urination (up to 10-15 times at night)  Urgency to urinate also  Flow is good  No discomfort to urinate  occ has testicle pain (if he bends over)  If he has a kidney stone they hurt He does take asa  Noted blood in urine at ER  Unsure if he can see some blood in urine /none in ejaculate   H/o kidney stone Had some pain a few days ago -R flank pain  Lemonade helps  Stones were calcium   Results for orders placed or performed in visit on 04/03/20  POCT Urinalysis Dipstick (Automated)  Result Value Ref Range   Color, UA Dark Yellow    Clarity, UA Hazy    Glucose, UA Negative Negative   Bilirubin, UA Negative    Ketones, UA Negative    Spec Grav, UA >=1.030 (A) 1.010 - 1.025   Blood, UA 200 Ery/uL    pH, UA 5.5 5.0 - 8.0   Protein, UA Positive (A) Negative   Urobilinogen, UA 0.2 0.2 or 1.0 E.U./dL   Nitrite, UA Negative    Leukocytes, UA Negative Negative   concentrated urine  Did not drink a lot of fluids yesterday/today    Tingling in arm  Had a spot on his R forearm - then his veins looked distended  That went away Now occ he has a tingling in R wrist/hand -in middle finger   He does work with his hands  Notices it then   Prominent place in L medial leg  Nothing showed up on recent venous doppler    No chance of STD  Not sexually active   Has not had a psa    No fam hx of prostate cancer  Father had pancreatic cancer   Tingling in hands   Swollen place on leg   Last visit here  was 2015   HTN bp is stable today  No cp or palpitations or headaches or edema  No side effects to medicines  BP Readings from Last 3 Encounters:  04/03/20 132/86  04/01/20 137/82  03/20/20 (!) 144/96      Takes amlodipie 10 mg daily Metoprolol xl 25 mg daily   Pulse Readings from Last 3 Encounters:  04/03/20 73  04/01/20 83  03/20/20 71     CAD Sees cardiology (dx by coronary ca score)  Takes simvastatin 20 mg daily  Nitro prn Imdur 30 mg daily  Asa 81 mg   Was seen in ER for chest pain   H/o fatty liver disease  Prediabetes Eats low glycemic    Depression/anxiety   Takes sertraline 100 mg daily  Xanax    Lab Results  Component Value Date   CREATININE 0.87 03/20/2020   BUN 16 03/20/2020   NA 137 03/20/2020   K 4.0 03/20/2020   CL 102 03/20/2020   CO2 26 03/20/2020   Lab Results  Component Value Date   ALT 32 03/20/2020   AST 27 03/20/2020   ALKPHOS 54 03/20/2020   BILITOT 0.8 03/20/2020   Lab Results  Component Value Date   WBC 6.8 03/20/2020   HGB 16.3 03/20/2020   HCT 48.9 03/20/2020   MCV 84.2 03/20/2020   PLT 251 03/20/2020   Patient Active Problem List   Diagnosis Date Noted  . Hand paresthesia 04/03/2020  . Frequent urination 04/03/2020  . Prostate cancer screening 04/03/2020  . CAD (coronary artery disease) 04/01/2020  . Low back pain 09/18/2013  . Screen for STD (sexually transmitted disease) 04/20/2011  . TMJ (temporomandibular joint disorder)  12/25/2010  . Kidney stones 09/22/2010  . Hematuria 09/22/2010  . Routine general medical examination at a health care facility 07/22/2010  . TINEA VERSICOLOR 10/29/2009  . MEDIAL MENISCUS TEAR, LEFT 05/31/2008  . ACL TEAR, LEFT KNEE 05/31/2008  . Pain in joint, forearm 05/28/2008  . PROBLEMS WITH HEARING 03/18/2008  . Prediabetes 07/10/2007  . DERMATOPHYTOSIS OF NAIL 04/10/2007  . Dyslipidemia, goal LDL below 70 09/06/2006  . ANXIETY 09/06/2006  . DEPRESSION 09/06/2006  . Essential hypertension 09/06/2006  . GERD 09/06/2006  . FATTY LIVER DISEASE 09/06/2006   Past Medical History:  Diagnosis Date  . ADHD (attention deficit hyperactivity disorder)   . Anxiety state, unspecified   . Bipolar 1 disorder (HCC)   . CHEST PAIN 12/04/2008   Qualifier: Diagnosis of  By: Milinda Antis MD, Colon Flattery   . Depressive disorder, not elsewhere classified   . Dermatophytosis of nail   . Esophageal reflux   . HYPERGLYCEMIA 07/10/2007   Qualifier: Diagnosis of  By: Milinda Antis MD, Colon Flattery   . Kidney stones 09/22/2010  . Low back pain 09/18/2013  . Other chronic nonalcoholic liver disease   . Problems with hearing   . Prostatitis, unspecified   . Pure hypercholesterolemia   . Screen for STD (sexually transmitted disease) 04/20/2011  . Sprain of cruciate ligament of knee   . Tear of medial cartilage or meniscus of knee, current   . TMJ (temporomandibular joint disorder) 12/25/2010  . Unspecified essential hypertension   . Unspecified gastritis and gastroduodenitis without mention of hemorrhage   . Urethritis, unspecified    Past Surgical History:  Procedure Laterality Date  . No prior surgery     Social History   Tobacco Use  . Smoking status: Never Smoker  . Smokeless tobacco: Never Used  Vaping Use  . Vaping Use: Never used  Substance Use Topics  . Alcohol use: Yes    Comment: rare  . Drug use: No   Family History  Problem Relation Age of Onset  . Diabetes Father   . Hypertension Father   .  Heart failure Father    Allergies  Allergen Reactions  . Sulfonamide Derivatives     REACTION: hives   Current Outpatient Medications on File Prior to Visit  Medication Sig Dispense Refill  . ALPRAZolam (XANAX) 1 MG tablet Take 1  tablet by mouth Once daily as needed for anxiety.     Marland Kitchen. amLODipine (NORVASC) 10 MG tablet Take 10 mg by mouth daily.    Marland Kitchen. aspirin 81 MG chewable tablet Chew 1 tablet (81 mg total) by mouth daily. 30 tablet 0  . fish oil-omega-3 fatty acids 1000 MG capsule Take 2 g by mouth daily.    . isosorbide mononitrate (IMDUR) 30 MG 24 hr tablet Take 1 tablet (30 mg total) by mouth daily. 90 tablet 3  . metoprolol succinate (TOPROL XL) 25 MG 24 hr tablet Take 1 tablet (25 mg total) by mouth daily. 30 tablet 6  . Multiple Vitamin (MULTIVITAMIN) capsule Take 1 capsule by mouth daily.    . nitroGLYCERIN (NITROSTAT) 0.4 MG SL tablet Place 1 tablet (0.4 mg total) under the tongue every 5 (five) minutes as needed for chest pain. 30 tablet 0  . sertraline (ZOLOFT) 100 MG tablet Take 100 mg by mouth daily.    . simvastatin (ZOCOR) 20 MG tablet TAKE ONE TABLET BY MOUTH DAILY AT BEDTIME *OFFICE VISIT NEEDED FOR FURTHER REFILLS* 30 tablet 0   No current facility-administered medications on file prior to visit.     Review of Systems  Constitutional: Negative for activity change, appetite change, fatigue, fever and unexpected weight change.  HENT: Negative for congestion, rhinorrhea, sore throat and trouble swallowing.   Eyes: Negative for pain, redness, itching and visual disturbance.  Respiratory: Negative for cough, chest tightness, shortness of breath and wheezing.   Cardiovascular: Negative for chest pain and palpitations.  Gastrointestinal: Negative for abdominal pain, blood in stool, constipation, diarrhea and nausea.  Endocrine: Negative for cold intolerance, heat intolerance, polydipsia and polyuria.  Genitourinary: Positive for frequency, hematuria and testicular pain.  Negative for decreased urine volume, difficulty urinating, dysuria, penile discharge, penile pain and urgency.  Musculoskeletal: Negative for arthralgias, joint swelling and myalgias.  Skin: Negative for pallor and rash.  Neurological: Positive for numbness. Negative for dizziness, tremors, weakness and headaches.  Hematological: Negative for adenopathy. Does not bruise/bleed easily.  Psychiatric/Behavioral: Negative for decreased concentration and dysphoric mood. The patient is not nervous/anxious.        Objective:   Physical Exam Constitutional:      General: He is not in acute distress.    Appearance: Normal appearance. He is well-developed and well-nourished. He is obese. He is not ill-appearing or diaphoretic.  HENT:     Head: Normocephalic and atraumatic.     Ears:     Comments: Moderately hard of hearing-wearing hearing aides    Mouth/Throat:     Mouth: Oropharynx is clear and moist.  Eyes:     General: No scleral icterus.    Extraocular Movements: EOM normal.     Conjunctiva/sclera: Conjunctivae normal.     Pupils: Pupils are equal, round, and reactive to light.  Neck:     Thyroid: No thyromegaly.     Vascular: No carotid bruit or JVD.  Cardiovascular:     Rate and Rhythm: Normal rate and regular rhythm.     Pulses: Intact distal pulses.     Heart sounds: Normal heart sounds. No gallop.   Pulmonary:     Effort: Pulmonary effort is normal. No respiratory distress.     Breath sounds: Normal breath sounds. No wheezing or rales.     Comments: No crackles Abdominal:     General: Bowel sounds are normal. There is no distension or abdominal bruit.     Palpations: Abdomen is soft. There is  no mass.     Tenderness: There is no abdominal tenderness. There is no right CVA tenderness.     Comments: No suprapubic tenderness or fullness    Musculoskeletal:        General: No edema.     Cervical back: Normal range of motion and neck supple.     Right lower leg: No edema.      Left lower leg: No edema.     Comments: Prominent area on medial L lower leg- non tender and soft (not fluctuant)   Lymphadenopathy:     Cervical: No cervical adenopathy.  Skin:    General: Skin is warm and dry.     Coloration: Skin is not pale.     Findings: No erythema or rash.  Neurological:     Mental Status: He is alert.     Sensory: No sensory deficit.     Coordination: Coordination normal.     Deep Tendon Reflexes: Reflexes are normal and symmetric. Reflexes normal.     Comments: Tinel sign pos in both hands for mild finger tingling   Psychiatric:        Mood and Affect: Mood and affect and mood normal.           Assessment & Plan:   Problem List Items Addressed This Visit      Cardiovascular and Mediastinum   Essential hypertension    bp in fair control at this time  BP Readings from Last 1 Encounters:  04/03/20 132/86   No changes needed Most recent labs reviewed  Disc lifstyle change with low sodium diet and exercise  Plan to continue amlodipine 10 mg daily  Metoprolol xl 25 mg daily  Reviewed last cardiology encounter       Relevant Orders   Lipid panel (Completed)   CAD (coronary artery disease) - Primary    Reviewed cardiology encounters  Well controlled bp  Due for lipid check , taking simvastatin 20 mg daily  Recently much improved diet  Continues imdur 30 mg daily  , asa 81 mg daily and prn nitro        Genitourinary   Kidney stones    History of kidney stones Had flank pain last week  Micro hematuria  No longer symptomatic pain wise        Other   Prediabetes    A1C drawn today  Some increased urination  Much improved diet over past 2 months  Disc plan for exercise  disc imp of low glycemic diet and wt loss to prevent DM2       Relevant Orders   Hemoglobin A1c (Completed)   Hematuria    Microscopic Possibly passed kidney stone last week  Frequent urination noted (no dysuria)      Relevant Orders   Urine Culture   Hand  paresthesia    Intermittent/both hands (esp 3rd finger R hand)  Mildly pos tinel sign bilateral  Disc use of wrist splints at night for possible carpal tunnel Will report back if no improvement       Frequent urination    In pt with h/o renal stones and remote h/o prostatitis  Not sexually active  ua -concentrated and with micro blood cx ordered  Will more than likely need to f/u with urology       Relevant Orders   Urine Culture   Prostate cancer screening    Pt has not had screen in the past  No flow issues but pt notes  urinary freqency  psa done       Relevant Orders   PSA (Completed)    Other Visit Diagnoses    Urinary frequency       Relevant Orders   POCT Urinalysis Dipstick (Automated) (Completed)

## 2020-04-03 NOTE — Assessment & Plan Note (Signed)
Reviewed cardiology encounters  Well controlled bp  Due for lipid check , taking simvastatin 20 mg daily  Recently much improved diet  Continues imdur 30 mg daily  , asa 81 mg daily and prn nitro

## 2020-04-03 NOTE — Assessment & Plan Note (Signed)
Pt has not had screen in the past  No flow issues but pt notes urinary freqency  psa done

## 2020-04-03 NOTE — Assessment & Plan Note (Signed)
Intermittent/both hands (esp 3rd finger R hand)  Mildly pos tinel sign bilateral  Disc use of wrist splints at night for possible carpal tunnel Will report back if no improvement

## 2020-04-03 NOTE — Assessment & Plan Note (Signed)
History of kidney stones Had flank pain last week  Micro hematuria  No longer symptomatic pain wise

## 2020-04-03 NOTE — Patient Instructions (Addendum)
Get at least 64 oz of water a day   Try some wrist splints at night  ? Early carpal tunnel   Keep up the great work with diet and exercise!  We will alert you when labs and urine culture returns  We may consider urology referral    Let me know if symptoms change   Consider getting a covid vaccination  COVID-19 Vaccine Information can be found at: [email or web address] For questions related to vaccine distribution or appointments, please email [email or web address] or call [phone].

## 2020-04-03 NOTE — Assessment & Plan Note (Signed)
bp in fair control at this time  BP Readings from Last 1 Encounters:  04/03/20 132/86   No changes needed Most recent labs reviewed  Disc lifstyle change with low sodium diet and exercise  Plan to continue amlodipine 10 mg daily  Metoprolol xl 25 mg daily  Reviewed last cardiology encounter

## 2020-04-03 NOTE — Assessment & Plan Note (Signed)
In pt with h/o renal stones and remote h/o prostatitis  Not sexually active  ua -concentrated and with micro blood cx ordered  Will more than likely need to f/u with urology

## 2020-04-03 NOTE — Assessment & Plan Note (Signed)
A1C drawn today  Some increased urination  Much improved diet over past 2 months  Disc plan for exercise  disc imp of low glycemic diet and wt loss to prevent DM2

## 2020-04-03 NOTE — Assessment & Plan Note (Signed)
Microscopic Possibly passed kidney stone last week  Frequent urination noted (no dysuria)

## 2020-04-04 ENCOUNTER — Telehealth: Payer: Self-pay | Admitting: *Deleted

## 2020-04-04 LAB — URINE CULTURE
MICRO NUMBER:: 11415028
Result:: NO GROWTH
SPECIMEN QUALITY:: ADEQUATE

## 2020-04-04 NOTE — Telephone Encounter (Signed)
-----   Message from Judy Pimple, MD sent at 04/03/2020  7:15 PM EST ----- Psa if mildly elevated - this can indicate prostate inflammation  I want to refer him to urology if that is ok  Does he prefer a practice ? Still pending urine culture  Cholesterol is fairly well controlled  A1c is ok -still in prediabetic range  Work on diet and exercise

## 2020-04-04 NOTE — Telephone Encounter (Signed)
Left VM requesting pt to call the office back 

## 2020-04-08 ENCOUNTER — Telehealth: Payer: Self-pay | Admitting: Cardiology

## 2020-04-08 NOTE — Telephone Encounter (Signed)
*  STAT* If patient is at the pharmacy, call can be transferred to refill team.   1. Which medications need to be refilled? (please list name of each medication and dose if known) simvastatin (ZOCOR) 20 MG tablet  2. Which pharmacy/location (including street and city if local pharmacy) is medication to be sent to? [facility] on [address]  3. Do they need a 30 day or 90 day supply? 90 day

## 2020-04-09 MED ORDER — SIMVASTATIN 20 MG PO TABS: ORAL_TABLET | ORAL | 0 refills | Status: DC

## 2020-04-10 NOTE — Telephone Encounter (Signed)
Patient is following up. The refill was sent to the incorrect pharmacy. He states he would like to have it transferred to the pharmacy listed below, for a 90 day supply.   *STAT* If patient is at the pharmacy, call can be transferred to refill team.   1. Which medications need to be refilled? (please list name of each medication and dose if known) simvastatin (ZOCOR) 20 MG tablet  2. Which pharmacy/location (including street and city if local pharmacy) is medication to be sent to? [facility] - [address], [place], [place] [address]  3. Do they need a 30 day or 90 day supply? 90 day supply

## 2020-04-17 MED ORDER — SIMVASTATIN 20 MG PO TABS: ORAL_TABLET | ORAL | 0 refills | Status: DC

## 2020-04-17 NOTE — Telephone Encounter (Signed)
Medication sent to correct pharmacy  

## 2020-04-17 NOTE — Addendum Note (Signed)
Addended by: Orlene Och on: 04/17/2020 02:45 PM   Modules accepted: Orders

## 2020-04-17 NOTE — Telephone Encounter (Signed)
Pt called in again and stated that this med should have seen sent to [facility] and not [facility].  He is completely of of the med.  He also stated he would like to make sure this is a 90 day supply  Just the Simvastatin  Pt does not have ins and it is  much cheaper for him   He request to pick this up today   Best number - [phone]

## 2020-05-13 ENCOUNTER — Ambulatory Visit: Payer: Self-pay | Admitting: Cardiology

## 2020-05-14 ENCOUNTER — Telehealth: Payer: Self-pay | Admitting: Cardiology

## 2020-05-14 NOTE — Telephone Encounter (Signed)
    COVID-19 Pre-Screening Questions V2.:  . In the past 7 to 10 days have you had a cough,  shortness of breath, headache, congestion, fever (100 or greater) body aches, chills, sore throat, or sudden loss of taste or sense of smell,? . Have you been around anyone with known Covid 19, or who is waiting for a Covid test result and is symptomatic?  FYI-- Patient states he tested positive for COVID on 05/13/20. He states he has a sore throat, chest pressure (no pain), and a mild headache. No additional symptoms. Patient missed 05/13/20 appointment with Kerin Ransom, PA  and rescheduled for 05/29/20.   For patients who are Covid+, pending results or exposed in the last 5 days WITH SYMPTOMS:  1.       Offer to change appointment to a House appointment. If the patient declines, contact covering nurse or triage so it can be determined if patient needs to be seen or     rescheduled. (assumes patient calls ahead)  2.       If a patient presents in the lobby, ask them to return to their car and the nurse will call them. Obtain the correct cell phone number and message the covering nurse. The nurse will triage the patient and discuss with the provider to determine appropriate visit plan (In office, MyChart or reschedule).   3.       If it is determined the patient needs to be seen in the office, arrangements will be made for the patient to be seen in a remote room with minimal touches. (Location will be         specific to each [facility] site).               If you have any concerns/questions about symptoms patients report during screening (either on the phone or at threshold),                Send a secure chat with the patient's chart to the APP doing preop clearances for that day.              If the decision is made to see the patient, document the following in the appt. note:  "cleared by APP's initials".                        If an APP is not available contact a member of the leadership  team.   Patients who are Covid+ are allowed in the office when the following are met: 1. No symptoms or improving symptoms  2. At least 5 days post positive test

## 2020-05-22 ENCOUNTER — Other Ambulatory Visit: Payer: Self-pay | Admitting: Cardiology

## 2020-05-29 ENCOUNTER — Encounter: Payer: Self-pay | Admitting: Cardiology

## 2020-05-29 ENCOUNTER — Ambulatory Visit (INDEPENDENT_AMBULATORY_CARE_PROVIDER_SITE_OTHER): Payer: Self-pay | Admitting: Cardiology

## 2020-05-29 ENCOUNTER — Other Ambulatory Visit: Payer: Self-pay

## 2020-05-29 VITALS — BP 142/90 | HR 86 | Ht 66.0 in | Wt 220.0 lb

## 2020-05-29 DIAGNOSIS — I2510 Atherosclerotic heart disease of native coronary artery without angina pectoris: Secondary | ICD-10-CM

## 2020-05-29 MED ORDER — METOPROLOL SUCCINATE ER 25 MG PO TB24: 25.0000 mg | ORAL_TABLET | Freq: Every day | ORAL | 3 refills | Status: DC

## 2020-05-29 NOTE — Progress Notes (Signed)
Cardiology Office Note:    Date:  05/29/2020   ID:  Glenn Stewart, DOB December 10, 1965, MRN [id number]  PCP:  Judy Pimple, MD  Cardiologist:  No primary care provider on file.  Electrophysiologist:  None   Referring MD: Judy Pimple, MD   No chief complaint on file.   History of Present Illness:    Glenn Stewart is a 55 y.o. male who is HOH with a hx of chest pain, hypertension, dyslipidemia, ADHD, and a past history of bipolar disorder. 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    He has been evaluated for chest pain in the past.  He had a negative stress echo in 2010.  Coronary CT scoring in 2016 showed a calcium score of 2076 which put him in the 99th percentile.  At that time coronary angiogram was recommended.  The patient was concerned about cost then and he underwent a treadmill which was low risk and he did well after this.    He was seen in the emergency room 03/20/2020 with complaints of left-sided chest pain.  He describes a vague ache on the left side of his chest.  He also had some unusual symptoms including swelling on his left calf (D-dimer and venous dopplers negative) and tingling in his right wrist.  His troponin and EKG were negative.  He was placed on isosorbide mononitrate. I saw him in f/u 04/01/2020.  He said since then he has changed his diet.  he was doing well at that visit.  I did add Toprol that visit.  He returns today for follow up.  He continues to do well, no c/o chest pain or unusual dyspnea.   Past Medical History:  Diagnosis Date  . ADHD (attention deficit hyperactivity disorder)   . Anxiety state, unspecified   . Bipolar 1 disorder (HCC)   . CHEST PAIN 12/04/2008   Qualifier: Diagnosis of  By: Milinda Antis MD, Colon Flattery   . Depressive disorder, not elsewhere classified   . Dermatophytosis of nail   . Esophageal reflux   . HYPERGLYCEMIA 07/10/2007   Qualifier: Diagnosis of  By: Milinda Antis MD, Colon Flattery   . Kidney stones 09/22/2010  . Low back  pain 09/18/2013  . Other chronic nonalcoholic liver disease   . Problems with hearing   . Prostatitis, unspecified   . Pure hypercholesterolemia   . Screen for STD (sexually transmitted disease) 04/20/2011  . Sprain of cruciate ligament of knee   . Tear of medial cartilage or meniscus of knee, current   . TMJ (temporomandibular joint disorder) 12/25/2010  . Unspecified essential hypertension   . Unspecified gastritis and gastroduodenitis without mention of hemorrhage   . Urethritis, unspecified     Past Surgical History:  Procedure Laterality Date  . No prior surgery      Current Medications: No outpatient medications have been marked as taking for the 05/29/20 encounter (Office Visit) with Abelino Derrick, PA-C.     Allergies:   Sulfonamide derivatives   Social History   Socioeconomic History  . Marital status: Single    Spouse name: Not on file  . Number of children: 0  . Years of education: Not on file  . Highest education level: Not on file  Occupational History  . Occupation: Owns Physicist, medical  Tobacco Use  . Smoking status: Never Smoker  . Smokeless tobacco: Never Used  Vaping Use  . Vaping Use: Never used  Substance and Sexual Activity  .  Alcohol use: Yes    Comment: rare  . Drug use: No  . Sexual activity: Not on file  Other Topics Concern  . Not on file  Social History Narrative  . Not on file   Social Determinants of Health   Financial Resource Strain: Not on file  Food Insecurity: Not on file  Transportation Needs: Not on file  Physical Activity: Not on file  Stress: Not on file  Social Connections: Not on file     Family History: The patient's family history includes Diabetes in his father; Heart failure in his father; Hypertension in his father.  ROS:   Please see the history of present illness.     All other systems reviewed and are negative.  EKGs/Labs/Other Studies Reviewed:     EKG:  EKG is not ordered today.  The ekg ordered  1/-05/2020 demonstrates NSR- HR 60  Recent Labs: 03/20/2020: ALT 32; BUN 16; Creatinine, Ser 0.87; Hemoglobin 16.3; Platelets 251; Potassium 4.0; Sodium 137  Recent Lipid Panel    Component Value Date/Time   CHOL 140 04/03/2020 1146   TRIG 145.0 04/03/2020 1146   HDL 39.70 04/03/2020 1146   CHOLHDL 4 04/03/2020 1146   VLDL 29.0 04/03/2020 1146   LDLCALC 72 04/03/2020 1146   LDLDIRECT 117.5 04/10/2012 1012    Physical Exam:    VS:  BP (!) 142/90     Wt Readings from Last 3 Encounters:  04/03/20 205 lb 3 oz (93.1 kg)  04/01/20 209 lb (94.8 kg)  11/28/17 226 lb (102.5 kg)     GEN: Well nourished, well developed in no acute distress HEENT: Normal NECK: No JVD; No carotid bruits CARDIAC: RRR, no murmurs, rubs, gallops RESPIRATORY:  Clear to auscultation without rales, wheezing or rhonchi  ABDOMEN: Soft, non-distended MUSCULOSKELETAL:  No edema; No deformity  SKIN: Warm and dry NEUROLOGIC:  Alert and oriented x 3 PSYCHIATRIC:  Normal affect   ASSESSMENT:    Chest pain of uncertain etiology His symptoms when seen 03/20/2020 did not sound like angina.  He is currently doing well.  Continue current Rx.  If he develops any exertional chest pain or increased symptoms he will need diagnostic cath.   CAD (coronary artery disease) Suspected CAD with coronary Ca++ of 2076- 99th percentile in 2016 Negative GXT 2016  Essential hypertension Controlled- not sure why he wasn't taking Toprol 50 mg daily- I will start this at 25 mg daily.  Dyslipidemia, goal LDL below 70 He is on statin Rx -PCP following  PLAN:    Same Rx- f/u 6 months.    Medication Adjustments/Labs and Tests Ordered: Current medicines are reviewed at length with the patient today.  Concerns regarding medicines are outlined above.  No orders of the defined types were placed in this encounter.  No orders of the defined types were placed in this encounter.   There are no Patient Instructions on file for  this visit.   Jolene Provost, PA-C  05/29/2020 10:01 AM    [facility]

## 2020-05-29 NOTE — Patient Instructions (Signed)
Medication Instructions:  The current medical regimen is effective;  continue present plan and medications as directed. Please refer to the Current Medication list given to you today.  *If you need a refill on your cardiac medications before your next appointment, please call your pharmacy*  Follow-Up: Your next appointment:  6 month(s) In Person with You may see No primary care provider on file. or one of the following Advanced Practice Providers on your designated Care Team:  Marjie Skiff, [place] -OR-  Edd Fabian, FNP  At [facility], you and your health needs are our priority.  As part of our continuing mission to provide you with exceptional heart care, we have created designated Provider Care Teams.  These Care Teams include your primary Cardiologist (physician) and Advanced Practice Providers (APPs -  Physician Assistants and Nurse Practitioners) who all work together to provide you with the care you need, when you need it.

## 2020-09-16 ENCOUNTER — Telehealth: Payer: Self-pay

## 2020-09-16 NOTE — Telephone Encounter (Signed)
Returned call, lvm for patient to call back. He did have a urine POCT here and culture performed at [facility]. ( Our [place] lab doesn't do urine cultures)

## 2020-09-16 NOTE — Telephone Encounter (Signed)
[facility] Night - Client Nonclinical Telephone Record  [facility] Client [facility] Night - Client Client Site [facility] - Night Physician Roxy Manns - MD Contact Type Call Who Is Calling Patient / Member / Family / Caregiver Caller Name Eldar Robitaille Caller Phone Number [phone] Patient Name Glenn Stewart Patient DOB 07/15/1965 Call Type Message Only Information Provided Reason for Call Request for General Office Information Initial Comment Caller states he had a visit back in January 13th and had a urine culture. He is wondering if the office send that out to be tested or if it is in house. He states he received a bill from office and then from [employer]. Additional Comment Office hours provided. Disp. Time Disposition Final User 09/15/2020 5:06:07 PM General Information Provided Yes Outlaw-Simmons, Eileen Stanford Call Closed By: Marinus Maw Transaction Date/Time: 09/15/2020 5:02:49 PM (ET)

## 2020-09-17 NOTE — Telephone Encounter (Signed)
Spoke to the patient today regarding a urine culture that was done. I confirmed we do use either [facility] or [facility] for tests that are not done by our [facility]. ( Urine culture)

## 2020-09-29 ENCOUNTER — Emergency Department (HOSPITAL_COMMUNITY)
Admission: EM | Admit: 2020-09-29 | Discharge: 2020-09-30 | Disposition: A | Discharge status: Home / Self Care | Payer: Self-pay | Attending: Emergency Medicine | Admitting: Emergency Medicine

## 2020-09-29 ENCOUNTER — Other Ambulatory Visit: Payer: Self-pay

## 2020-09-29 ENCOUNTER — Telehealth: Payer: Self-pay

## 2020-09-29 ENCOUNTER — Emergency Department (HOSPITAL_COMMUNITY): Payer: Self-pay

## 2020-09-29 ENCOUNTER — Encounter (HOSPITAL_COMMUNITY): Payer: Self-pay

## 2020-09-29 ENCOUNTER — Ambulatory Visit (HOSPITAL_COMMUNITY)
Admission: EM | Admit: 2020-09-29 | Discharge: 2020-09-29 | Disposition: A | Discharge status: Home / Self Care | Payer: Self-pay

## 2020-09-29 DIAGNOSIS — R319 Hematuria, unspecified: Secondary | ICD-10-CM

## 2020-09-29 DIAGNOSIS — L03011 Cellulitis of right finger: Secondary | ICD-10-CM

## 2020-09-29 DIAGNOSIS — I2510 Atherosclerotic heart disease of native coronary artery without angina pectoris: Secondary | ICD-10-CM | POA: Insufficient documentation

## 2020-09-29 DIAGNOSIS — Z23 Encounter for immunization: Secondary | ICD-10-CM | POA: Insufficient documentation

## 2020-09-29 DIAGNOSIS — Z7982 Long term (current) use of aspirin: Secondary | ICD-10-CM | POA: Insufficient documentation

## 2020-09-29 DIAGNOSIS — L089 Local infection of the skin and subcutaneous tissue, unspecified: Secondary | ICD-10-CM | POA: Insufficient documentation

## 2020-09-29 DIAGNOSIS — S60021A Contusion of right index finger without damage to nail, initial encounter: Secondary | ICD-10-CM | POA: Insufficient documentation

## 2020-09-29 DIAGNOSIS — R350 Frequency of micturition: Secondary | ICD-10-CM

## 2020-09-29 DIAGNOSIS — Z79899 Other long term (current) drug therapy: Secondary | ICD-10-CM | POA: Insufficient documentation

## 2020-09-29 DIAGNOSIS — W260XXA Contact with knife, initial encounter: Secondary | ICD-10-CM | POA: Insufficient documentation

## 2020-09-29 DIAGNOSIS — I10 Essential (primary) hypertension: Secondary | ICD-10-CM | POA: Insufficient documentation

## 2020-09-29 LAB — CBC WITH DIFFERENTIAL/PLATELET
Abs Immature Granulocytes: 0.03 10*3/uL (ref 0.00–0.07)
Basophils Absolute: 0 10*3/uL (ref 0.0–0.1)
Basophils Relative: 0 %
Eosinophils Absolute: 0.1 10*3/uL (ref 0.0–0.5)
Eosinophils Relative: 1 %
HCT: 41.9 % (ref 39.0–52.0)
Hemoglobin: 13.8 g/dL (ref 13.0–17.0)
Immature Granulocytes: 0 %
Lymphocytes Relative: 22 %
Lymphs Abs: 2.3 10*3/uL (ref 0.7–4.0)
MCH: 27.8 pg (ref 26.0–34.0)
MCHC: 32.9 g/dL (ref 30.0–36.0)
MCV: 84.3 fL (ref 80.0–100.0)
Monocytes Absolute: 0.9 10*3/uL (ref 0.1–1.0)
Monocytes Relative: 9 %
Neutro Abs: 7.2 10*3/uL (ref 1.7–7.7)
Neutrophils Relative %: 68 %
Platelets: 279 10*3/uL (ref 150–400)
RBC: 4.97 MIL/uL (ref 4.22–5.81)
RDW: 12.9 % (ref 11.5–15.5)
WBC: 10.7 10*3/uL — ABNORMAL HIGH (ref 4.0–10.5)
nRBC: 0 % (ref 0.0–0.2)

## 2020-09-29 LAB — COMPREHENSIVE METABOLIC PANEL
ALT: 56 U/L — ABNORMAL HIGH (ref 0–44)
AST: 35 U/L (ref 15–41)
Albumin: 3.8 g/dL (ref 3.5–5.0)
Alkaline Phosphatase: 72 U/L (ref 38–126)
Anion gap: 11 (ref 5–15)
BUN: 12 mg/dL (ref 6–20)
CO2: 24 mmol/L (ref 22–32)
Calcium: 9.5 mg/dL (ref 8.9–10.3)
Chloride: 102 mmol/L (ref 98–111)
Creatinine, Ser: 0.8 mg/dL (ref 0.61–1.24)
GFR, Estimated: >60 mL/min (ref >60)
Glucose, Bld: 92 mg/dL (ref 70–99)
Potassium: 3.7 mmol/L (ref 3.5–5.1)
Sodium: 137 mmol/L (ref 135–145)
Total Bilirubin: 1.5 mg/dL — ABNORMAL HIGH (ref 0.3–1.2)
Total Protein: 7.2 g/dL (ref 6.5–8.1)

## 2020-09-29 LAB — LACTIC ACID, PLASMA: Lactic Acid, Venous: 0.9 mmol/L (ref 0.5–1.9)

## 2020-09-29 NOTE — ED Provider Notes (Signed)
Emergency Medicine Provider Triage Evaluation Note  Glenn Stewart , a 55 y.o. male  was evaluated in triage.  Pt complains of right index finger pain and swelling.  Patient cut his finger 6 days ago by accident.  He states that he sliced off of a portion of the tissue along the dorsal aspect of the finger just proximal to the fingernail.  He states that he cleaned the wound and it appeared to be healing well.  About 2 days ago it began swelling.  It has been worsening.  He is now having purulent discharge from the wound.  He was seen at urgent care and sent to the emergency department for further evaluation.  Denies fevers, chills, or vomiting.  Physical Exam  BP (!) 141/84 (BP Location: Left Arm)   Pulse 78   Temp 99.8 F (37.7 C)   Resp 16   SpO2 99%  Gen:   Awake, no distress   Resp:  Normal effort  MSK:   Moves extremities without difficulty  Other:  Wound noted to the right index finger overlying the PIP on the dorsum of the finger. Purulent discharge coming from the site. Extensive soft tissue swelling throughout the finger.   Medical Decision Making  Medically screening exam initiated at 8:02 PM.  Appropriate orders placed.  RACHEL SAMPLES was informed that the remainder of the evaluation will be completed by another provider, this initial triage assessment does not replace that evaluation, and the importance of remaining in the ED until their evaluation is complete.    Placido Sou, PA-C 09/29/20 2017    Milagros Loll, MD 09/30/20 [phone]

## 2020-09-29 NOTE — ED Provider Notes (Addendum)
[facility]    CSN: [id number] Arrival date & time: 09/29/20  1513      History   Chief Complaint Chief Complaint  Patient presents with   Laceration    HPI Glenn Stewart is a 55 y.o. male.   Patient presents for assessment of wound to right index finger. Cut finger with knife 6 days ago. Completed wound care on self, applied hydrogen peroxide, topical antibiotics and kept wrapped to keep skin in place to heal. Skin has since feel off. Finger started to become tight and swollen this morning, Discoloration and spreading of what seems to be fluid or puss begin within the last 2-3 hours.    Past Medical History:  Diagnosis Date   ADHD (attention deficit hyperactivity disorder)    Anxiety state, unspecified    Bipolar 1 disorder (HCC)    CHEST PAIN 12/04/2008   Qualifier: Diagnosis of  By: Milinda Antis MD, Colon Flattery    Depressive disorder, not elsewhere classified    Dermatophytosis of nail    Esophageal reflux    HYPERGLYCEMIA 07/10/2007   Qualifier: Diagnosis of  By: Milinda Antis MD, Colon Flattery    Kidney stones 09/22/2010   Low back pain 09/18/2013   Other chronic nonalcoholic liver disease    Problems with hearing    Prostatitis, unspecified    Pure hypercholesterolemia    Screen for STD (sexually transmitted disease) 04/20/2011   Sprain of cruciate ligament of knee    Tear of medial cartilage or meniscus of knee, current    TMJ (temporomandibular joint disorder) 12/25/2010   Unspecified essential hypertension    Unspecified gastritis and gastroduodenitis without mention of hemorrhage    Urethritis, unspecified     Patient Active Problem List   Diagnosis Date Noted   Hand paresthesia 04/03/2020   Frequent urination 04/03/2020   Prostate cancer screening 04/03/2020   CAD (coronary artery disease) 04/01/2020   Low back pain 09/18/2013   Screen for STD (sexually transmitted disease) 04/20/2011   TMJ (temporomandibular joint disorder) 12/25/2010   Kidney stones  09/22/2010   Hematuria 09/22/2010   Routine general medical examination at a health care facility 07/22/2010   TINEA VERSICOLOR 10/29/2009   MEDIAL MENISCUS TEAR, LEFT 05/31/2008   ACL TEAR, LEFT KNEE 05/31/2008   Pain in joint, forearm 05/28/2008   PROBLEMS WITH HEARING 03/18/2008   Prediabetes 07/10/2007   DERMATOPHYTOSIS OF NAIL 04/10/2007   Dyslipidemia, goal LDL below 70 09/06/2006   ANXIETY 09/06/2006   DEPRESSION 09/06/2006   Essential hypertension 09/06/2006   GERD 09/06/2006   FATTY LIVER DISEASE 09/06/2006    Past Surgical History:  Procedure Laterality Date   No prior surgery         Home Medications    Prior to Admission medications   Medication Sig Start Date End Date Taking? Authorizing Provider  ALPRAZolam Prudy Feeler) 1 MG tablet Take 1 tablet by mouth Once daily as needed for anxiety.  04/17/11   [provider]  amLODipine (NORVASC) 10 MG tablet Take 10 mg by mouth daily.    [provider]  aspirin 81 MG chewable tablet Chew 1 tablet (81 mg total) by mouth daily. 03/20/20   Couture, Cortni S, PA-C  fish oil-omega-3 fatty acids 1000 MG capsule Take 2 g by mouth daily.    [provider]  isosorbide mononitrate (IMDUR) 30 MG 24 hr tablet Take 1 tablet (30 mg total) by mouth daily. 04/01/20   Abelino Derrick, PA-C  metoprolol  succinate (TOPROL XL) 25 MG 24 hr tablet Take 1 tablet (25 mg total) by mouth daily. 05/29/20   Abelino Derrick, PA-C  Multiple Vitamin (MULTIVITAMIN) capsule Take 1 capsule by mouth daily.    [provider]  nitroGLYCERIN (NITROSTAT) 0.4 MG SL tablet Place 1 tablet (0.4 mg total) under the tongue every 5 (five) minutes as needed for chest pain. 03/20/20   Couture, Cortni S, PA-C  sertraline (ZOLOFT) 100 MG tablet Take 100 mg by mouth daily.    [provider]  simvastatin (ZOCOR) 20 MG tablet TAKE ONE TABLET BY MOUTH EVERY NIGHT AT BEDTIME 05/22/20   Lewayne Bunting, MD    Family History Family  History  Problem Relation Age of Onset   Diabetes Father    Hypertension Father    Heart failure Father     Social History Social History   Tobacco Use   Smoking status: Never   Smokeless tobacco: Never  Vaping Use   Vaping Use: Never used  Substance Use Topics   Alcohol use: Yes    Comment: rare   Drug use: No     Allergies   Sulfonamide derivatives   Review of Systems Review of Systems   Physical Exam Triage Vital Signs ED Triage Vitals  Enc Vitals Group     BP 09/29/20 1646 (!) 155/124     Pulse Rate 09/29/20 1646 89     Resp 09/29/20 1646 16     Temp 09/29/20 1646 98.1 F (36.7 C)     Temp Source 09/29/20 1646 Oral     SpO2 09/29/20 1646 100 %     Weight --      Height --      Head Circumference --      Peak Flow --      Pain Score 09/29/20 1643 0     Pain Loc --      Pain Edu? --      Excl. in GC? --    No data found.  Updated Vital Signs BP (!) 155/124 (BP Location: Left Arm)   Pulse 89   Temp 98.1 F (36.7 C) (Oral)   Resp 16   SpO2 100%   Visual Acuity Right Eye Distance:   Left Eye Distance:   Bilateral Distance:    Right Eye Near:   Left Eye Near:    Bilateral Near:     Physical Exam   UC Treatments / Results  Labs (all labs ordered are listed, but only abnormal results are displayed) Labs Reviewed - No data to display  EKG   Radiology No results found.  Procedures Procedures (including critical care time)  Medications Ordered in UC Medications - No data to display  Initial Impression / Assessment and Plan / UC Course  I have reviewed the triage vital signs and the nursing notes.  Pertinent labs & imaging results that were available during my care of the patient were reviewed by me and considered in my medical decision making (see chart for details).  Felon of finger of right hand  Patient sent to emergency department for evaluation of felon, defer to photo for reference   Final Clinical Impressions(s) / UC  Diagnoses   Final diagnoses:  Felon of finger of right hand   Discharge Instructions   None    ED Prescriptions   None    PDMP not reviewed this encounter.   Valinda Hoar, NP 09/29/20 1718    Valinda Hoar, NP  09/29/20 1718  

## 2020-09-29 NOTE — ED Triage Notes (Signed)
Pt cut R index finger last Tuesday, cleaned w hydrogen peroxide, saline. 2 days ago, states he feels it's getting infected. Was seen at [facility] for same, sent here for possible I&D 4/10 tightness in index finger, soreness

## 2020-09-29 NOTE — Telephone Encounter (Signed)
Patient states he saw Dr Milinda Antis in January and was suppose to have had a urology referral to [facility] placed but did not get a call about this. I do not see a referral in the system but this was mentioned in the lab results note.  Please let patient know/update on this when able to. Thank you

## 2020-09-29 NOTE — Telephone Encounter (Signed)
I put the referral in.

## 2020-09-29 NOTE — Telephone Encounter (Signed)
[facility] Day - Client TELEPHONE ADVICE RECORD [facility] Patient Name: Glenn Stewart Gender: Male DOB: 05/14/1965 Age: 55 Y 1 M 9 D Return Phone Number: [phone] (Primary) Address: City/ State/ Zip: [place] [address] Client [facility] Day - Client Client Site [facility] - Day Physician [place], Idamae Schuller - MD Contact Type Call Who Is Calling Patient / Member / Family / Caregiver Call Type Triage / Clinical Relationship To Patient Self Return Phone Number [phone] (Primary) Chief Complaint Cuts and Lacerations Reason for Call Symptomatic / Request for Health Information Initial Comment Caller states he has a cut on his finger that is very swollen and painful. He has an appointment tomorrow but is wanting triage. Translation No Nurse Assessment Nurse: D'Heur Ezzard Standing, RN, Adrienne Date/Time (Eastern Time): 09/29/2020 12:40:13 PM Confirm and document reason for call. If symptomatic, describe symptoms. ---Caller states he has a cut on his right index finger that is very swollen and painful. He has an appointment tomorrow but is wanting triage. He has been putting Neosporin & bandage on it. Cut happened on Tuesday with knife, cut was at an angle. No fever. He had noticed pus on the underside of his finger earlier on but none now. He is cleaning it often. Does the patient have any new or worsening symptoms? ---Yes Will a triage be completed? ---Yes Related visit to physician within the last 2 weeks? ---No Does the PT have any chronic conditions? (i.e. diabetes, asthma, this includes High risk factors for pregnancy, etc.) ---Yes List chronic conditions. ---Cardiac issues, HTN, high cholesterol Is this a behavioral health or substance abuse call? ---No Guidelines Guideline Title Affirmed Question Affirmed Notes Nurse Date/Time (Eastern Time) Cuts and Lacerations [1] Last tetanus shot > 10  years ago AND [2] CLEAN cut D'Heur Ezzard Standing, RN, Adrienne 09/29/2020 12:44:41 PM Disp. Time Lamount Cohen Time) Disposition Final User PLEASE NOTE: All timestamps contained within this report are represented as [place] Standard Time. CONFIDENTIALTY NOTICE: This fax transmission is intended only for the addressee. It contains information that is legally privileged, confidential or otherwise protected from use or disclosure. If you are not the intended recipient, you are strictly prohibited from reviewing, disclosing, copying using or disseminating any of this information or taking any action in reliance on or regarding this information. If you have received this fax in error, please notify us immediately by telephone so that we can arrange for its return to [place]. Phone: [phone], Toll-Free: [phone], Fax: [phone] Page: 2 of 2 Call Id: [id number] 09/29/2020 12:53:01 PM SEE PCP WITHIN 3 DAYS Yes D'Heur Ezzard Standing, RN, Hansel Starling Caller Disagree/Comply Comply Caller Understands Yes PreDisposition Call Doctor Care Advice Given Per Guideline SEE PCP WITHIN 3 DAYS: * You need to be seen within 2 or 3 days. ANTIBIOTIC OINTMENT: * Put a small amount of ANTIBIOTIC OINTMENT on the wound. Cover it with an adhesive bandage (such as a Band-Aid) or a dressing. Change daily or if it becomes wet. PAIN MEDICINES: * For pain relief, you can take either acetaminophen, ibuprofen, or naproxen. CALL BACK IF: * Looks infected (pus, redness, increasing tenderness) * Doesn't heal within 14 days * You become worse * ACETAMINOPHEN - EXTRA STRENGTH TYLENOL: Take 1,000 mg (two 500 mg pills) every 8 hours as needed. Each Extra Strength Tylenol pill has 500 mg of acetaminophen. The most you should take each day is 3,000 mg (6 pills a day). CARE ADVICE given per Cuts and Lacerations (Adult) guideline.

## 2020-09-29 NOTE — Telephone Encounter (Signed)
Aware, will watch for correspondence  

## 2020-09-29 NOTE — Telephone Encounter (Signed)
Pt is currently at the [facility]

## 2020-09-29 NOTE — ED Triage Notes (Signed)
Pt reports cutting himself with a knife last Tuesday.  States he has applied hydrogen peroxide and has kept it clean. States it looked like it was heeling and states it started to look like puss.   States he took 2 Advils before coming here.

## 2020-09-30 ENCOUNTER — Ambulatory Visit: Payer: Self-pay | Admitting: Internal Medicine

## 2020-09-30 ENCOUNTER — Telehealth: Payer: Self-pay | Admitting: Surgery

## 2020-09-30 MED ORDER — CEPHALEXIN 500 MG PO CAPS: 500.0000 mg | ORAL_CAPSULE | Freq: Four times a day (QID) | ORAL | 0 refills | Status: DC

## 2020-09-30 MED ORDER — LIDOCAINE HCL 2 % IJ SOLN
20.0000 mL | Freq: Once | INTRAMUSCULAR | Status: AC
  Administered 2020-09-30: 400 mg
  Filled 2020-09-30: qty 20

## 2020-09-30 MED ORDER — TETANUS-DIPHTH-ACELL PERTUSSIS 5-2.5-18.5 LF-MCG/0.5 IM SUSY
0.5000 mL | PREFILLED_SYRINGE | Freq: Once | INTRAMUSCULAR | Status: AC
  Administered 2020-09-30: 0.5 mL via INTRAMUSCULAR
  Filled 2020-09-30: qty 0.5

## 2020-09-30 MED ORDER — HYDROCODONE-ACETAMINOPHEN 5-325 MG PO TABS
1.0000 | ORAL_TABLET | Freq: Once | ORAL | Status: AC
  Administered 2020-09-30: 1 via ORAL
  Filled 2020-09-30: qty 1

## 2020-09-30 MED ORDER — CEPHALEXIN 250 MG PO CAPS
500.0000 mg | ORAL_CAPSULE | Freq: Once | ORAL | Status: AC
  Administered 2020-09-30: 500 mg via ORAL
  Filled 2020-09-30: qty 2

## 2020-09-30 NOTE — ED Provider Notes (Signed)
[facility]   CSN: [id number] Arrival date & time: 09/29/20  1721     History Chief Complaint  Patient presents with   R finger injury    Glenn Stewart is a 55 y.o. male.  HPI     This a 55 year old male who presents with injury to the right second digit.  Patient reports that last week he accidentally injured his right index finger with a knife.  He avulsed some of the skin over the distal end of the finger on the dorsal side.  He states he has been doing wound care.  He reports they have been doing well although over the last 24 to 48 hours he has noted increased swelling, discoloration.  He has noted some purulent drainage from the wound itself.  He states that at work he may have hit his finger and caused some trauma.  He has not had any fevers.  No numbness or tingling.  Unknown last tetanus.  He is ambidextrous.  Past Medical History:  Diagnosis Date   ADHD (attention deficit hyperactivity disorder)    Anxiety state, unspecified    Bipolar 1 disorder (HCC)    CHEST PAIN 12/04/2008   Qualifier: Diagnosis of  By: Milinda Antisower MD, Colon FlatteryMarne Ann    Depressive disorder, not elsewhere classified    Dermatophytosis of nail    Esophageal reflux    HYPERGLYCEMIA 07/10/2007   Qualifier: Diagnosis of  By: Milinda Antisower MD, Colon FlatteryMarne Ann    Kidney stones 09/22/2010   Low back pain 09/18/2013   Other chronic nonalcoholic liver disease    Problems with hearing    Prostatitis, unspecified    Pure hypercholesterolemia    Screen for STD (sexually transmitted disease) 04/20/2011   Sprain of cruciate ligament of knee    Tear of medial cartilage or meniscus of knee, current    TMJ (temporomandibular joint disorder) 12/25/2010   Unspecified essential hypertension    Unspecified gastritis and gastroduodenitis without mention of hemorrhage    Urethritis, unspecified     Patient Active Problem List   Diagnosis Date Noted   Hand paresthesia 04/03/2020    Frequent urination 04/03/2020   Prostate cancer screening 04/03/2020   CAD (coronary artery disease) 04/01/2020   Low back pain 09/18/2013   Screen for STD (sexually transmitted disease) 04/20/2011   TMJ (temporomandibular joint disorder) 12/25/2010   Kidney stones 09/22/2010   Hematuria 09/22/2010   Routine general medical examination at a health care facility 07/22/2010   TINEA VERSICOLOR 10/29/2009   MEDIAL MENISCUS TEAR, LEFT 05/31/2008   ACL TEAR, LEFT KNEE 05/31/2008   Pain in joint, forearm 05/28/2008   PROBLEMS WITH HEARING 03/18/2008   Prediabetes 07/10/2007   DERMATOPHYTOSIS OF NAIL 04/10/2007   Dyslipidemia, goal LDL below 70 09/06/2006   ANXIETY 09/06/2006   DEPRESSION 09/06/2006   Essential hypertension 09/06/2006   GERD 09/06/2006   FATTY LIVER DISEASE 09/06/2006    Past Surgical History:  Procedure Laterality Date   No prior surgery         Family History  Problem Relation Age of Onset   Diabetes Father    Hypertension Father    Heart failure Father     Social History   Tobacco Use   Smoking status: Never   Smokeless tobacco: Never  Vaping Use   Vaping Use: Never used  Substance Use Topics   Alcohol use: Yes    Comment: rare   Drug use: No  Home Medications Prior to Admission medications   Medication Sig Start Date End Date Taking? Authorizing Provider  cephALEXin (KEFLEX) 500 MG capsule Take 1 capsule (500 mg total) by mouth 4 (four) times daily. 09/30/20  Yes Adreena Willits, Mayer Masker, MD  ALPRAZolam Prudy Feeler) 1 MG tablet Take 1 tablet by mouth Once daily as needed for anxiety.  04/17/11   [provider]  amLODipine (NORVASC) 10 MG tablet Take 10 mg by mouth daily.    [provider]  aspirin 81 MG chewable tablet Chew 1 tablet (81 mg total) by mouth daily. 03/20/20   Couture, Cortni S, PA-C  fish oil-omega-3 fatty acids 1000 MG capsule Take 2 g by mouth daily.    [provider]  isosorbide mononitrate (IMDUR) 30 MG 24 hr  tablet Take 1 tablet (30 mg total) by mouth daily. 04/01/20   Abelino Derrick, PA-C  metoprolol succinate (TOPROL XL) 25 MG 24 hr tablet Take 1 tablet (25 mg total) by mouth daily. 05/29/20   Abelino Derrick, PA-C  Multiple Vitamin (MULTIVITAMIN) capsule Take 1 capsule by mouth daily.    [provider]  nitroGLYCERIN (NITROSTAT) 0.4 MG SL tablet Place 1 tablet (0.4 mg total) under the tongue every 5 (five) minutes as needed for chest pain. 03/20/20   Couture, Cortni S, PA-C  sertraline (ZOLOFT) 100 MG tablet Take 100 mg by mouth daily.    [provider]  simvastatin (ZOCOR) 20 MG tablet TAKE ONE TABLET BY MOUTH EVERY NIGHT AT BEDTIME 05/22/20   Lewayne Bunting, MD    Allergies    Sulfonamide derivatives  Review of Systems   Review of Systems  Constitutional:  Negative for fever.  Musculoskeletal:        Finger swelling  Skin:  Positive for color change and wound.  All other systems reviewed and are negative.  Physical Exam Updated Vital Signs BP (!) 151/99   Pulse 73   Temp 98.6 F (37 C) (Oral)   Resp 18   SpO2 96%   Physical Exam Vitals and nursing note reviewed.  Constitutional:      Appearance: He is well-developed. He is obese. He is not ill-appearing.  HENT:     Head: Normocephalic and atraumatic.     Nose: Nose normal.     Mouth/Throat:     Mouth: Mucous membranes are moist.  Eyes:     Pupils: Pupils are equal, round, and reactive to light.  Cardiovascular:     Rate and Rhythm: Normal rate and regular rhythm.  Pulmonary:     Effort: Pulmonary effort is normal. No respiratory distress.  Abdominal:     Palpations: Abdomen is soft.  Musculoskeletal:     Cervical back: Neck supple.     Comments: Focused examination of the right hand with a 1 x 2 cm defect over the distal aspect of the dorsum of the right second digit just proximal to the nail, there is some purulent drainage noted, extensive fusiform swelling of the entire digit with minimal ability  to flex at the PIP or the DIP joint, swelling and bruising discoloration to the pad of the finger  Lymphadenopathy:     Cervical: No cervical adenopathy.  Skin:    General: Skin is warm and dry.  Neurological:     Mental Status: He is alert and oriented to person, place, and time.  Psychiatric:        Mood and Affect: Mood normal.      ED Results /  Procedures / Treatments   Labs (all labs ordered are listed, but only abnormal results are displayed) Labs Reviewed  COMPREHENSIVE METABOLIC PANEL - Abnormal; Notable for the following components:      Result Value   ALT 56 (*)    Total Bilirubin 1.5 (*)    All other components within normal limits  CBC WITH DIFFERENTIAL/PLATELET - Abnormal; Notable for the following components:   WBC 10.7 (*)    All other components within normal limits  CULTURE, BLOOD (ROUTINE X 2)  CULTURE, BLOOD (ROUTINE X 2)  LACTIC ACID, PLASMA  LACTIC ACID, PLASMA    EKG None  Radiology DG Hand Complete Right  Result Date: 09/29/2020 CLINICAL DATA:  Right index finger swelling, likely infection, laceration to index finger Tuesday EXAM: RIGHT HAND - COMPLETE 3+ VIEW COMPARISON:  Radiographs 05/29/2008 FINDINGS: Diffuse soft tissue swelling of the second digit. Questionable site of laceration along the dorsal aspect of the digit at the level of the interphalangeal joint. No soft tissue gas or foreign body is evident. No clear osseous erosion or destructive changes to suggest osteomyelitis at this time. No acute osseous abnormality or suspicious osseous lesion. Degenerative changes throughout the imaged hand and wrist. IMPRESSION: Diffuse soft tissue swelling of the second digit with possible site of laceration along the dorsal aspect the level of the distal interphalangeal joint. Correlate with visual inspection. No soft tissue gas or retained foreign body. No evidence of osteomyelitis or other acute osseous abnormality. Electronically Signed   By: Kreg Shropshire  M.D.   On: 09/29/2020 21:14    Procedures .Nerve Block  Date/Time: 09/30/2020 5:13 AM Performed by: Shon Baton, MD Authorized by: Shon Baton, MD   Consent:    Consent obtained:  Verbal   Risks, benefits, and alternatives were discussed: yes     Risks discussed:  Infection, nerve damage and pain   Alternatives discussed:  No treatment Indications:    Indications:  Pain relief Location:    Body area:  Upper extremity   Upper extremity nerve blocked: Digital block of the finger.   Laterality:  Right Pre-procedure details:    Skin preparation:  Chlorhexidine Procedure details:    Block needle gauge:  27 G   Block anesthetic: Lidocaine 2% without.   Steroid injected:  None   Block injection procedure: Circumferential digital block.   Medications Ordered in ED Medications  Tdap (BOOSTRIX) injection 0.5 mL (has no administration in time range)  cephALEXin (KEFLEX) capsule 500 mg (has no administration in time range)  lidocaine (XYLOCAINE) 2 % (with pres) injection 400 mg (400 mg Infiltration Given 09/30/20 0421)  HYDROcodone-acetaminophen (NORCO/VICODIN) 5-325 MG per tablet 1 tablet (1 tablet Oral Given 09/30/20 0420)    ED Course  I have reviewed the triage vital signs and the nursing notes.  Pertinent labs & imaging results that were available during my care of the patient were reviewed by me and considered in my medical decision making (see chart for details).  Clinical Course as of 09/30/20 0513  Tue Sep 30, 2020  0505 Hand surgery, Dr. Izora Ribas consulted.  He is currently in the OR.  Spoke with circulator who relayed consul information.  Dr. Izora Ribas requests follow-up in the office today.  No preference for the antibiotics. [CH]    Clinical Course User Index [CH] Virgal Warmuth, Mayer Masker, MD   MDM Rules/Calculators/A&P  Patient presents with obvious infection of the right second digit.  Prior injury.  He has extensive swelling and what  appears to be infection and hematoma of the fingertip.  Highly suspicious for a felon.  However he also has evidence of infection over the dorsum of the hand.  He has a slight leukocytosis to 10.  He is afebrile.  Lactate is normal.  I did digitally blocked the finger to get a better exam.  He had spontaneous purulent drainage out of the wound in addition to spontaneous drainage out of the pad of the finger from a crack in the skin.  Feel he would benefit from formal drainage.  I consulted with hand surgery.  See above.  He is otherwise well-appearing.  He will follow-up with Dr. Izora Ribas this morning in clinic.  Will start on Keflex.  After history, exam, and medical workup I feel the patient has been appropriately medically screened and is safe for discharge home. Pertinent diagnoses were discussed with the patient. Patient was given return precautions.  Final Clinical Impression(s) / ED Diagnoses Final diagnoses:  Finger infection    Rx / DC Orders ED Discharge Orders          Ordered    cephALEXin (KEFLEX) 500 MG capsule  4 times daily        09/30/20 0511             Odie Rauen, Mayer Masker, MD 09/30/20 [phone]

## 2020-09-30 NOTE — ED Notes (Signed)
Cleaned and wrapped finger. Pt verbalized comfort and understanding of discharge instructions and follow up care.

## 2020-09-30 NOTE — Discharge Instructions (Addendum)
You were seen today and have an infection of your finger.  You were given a dose of antibiotics.  Continue antibiotics and follow-up as an outpatient with Dr. Izora Ribas.

## 2020-09-30 NOTE — Telephone Encounter (Signed)
ED RNCM received call from patient concerning prescription that his paper prescription was lost at [facility], and requested that prescription to be called in.  Prescription was called into prescription call line.  Patient instructed to follow up with [facility].  No further ED RNCM needs identified.

## 2020-10-01 ENCOUNTER — Telehealth: Payer: Self-pay | Admitting: *Deleted

## 2020-10-01 NOTE — Telephone Encounter (Signed)
Patient called stating that he cut his finger and ended up with it getting infected. Patient stated that he has been taking Aleve but that only helps a little. Patient stated that his pain level is at about a 6. Patient denies a fever. Patient stated that the antibiotic seems to helping the infection. Patient stated that he just thought that Dr. Milinda Antis could possibly give him something a littler stronger than Aleve for the pain. Patient stated that Dr. Milinda Antis can review the ER notes and see what they did to his finger. Patient is aware that Dr. Milinda Antis is out of the office and may not see the message until tomorrow. Pharmacy [facility]

## 2020-10-02 MED ORDER — MELOXICAM 15 MG PO TABS: 15.0000 mg | ORAL_TABLET | Freq: Every day | ORAL | 0 refills | Status: DC | PRN

## 2020-10-02 NOTE — Telephone Encounter (Signed)
Called pt and he said he never called Dr. Debby Bud office. He didn't have an appt his check out papers just said to call and get an appt with Dr. Debby Bud office. Pt never called Dr. Debby Bud office because he said his logic is "his finger is getting better". Pt said the swelling is better and his hand is looking better he is taking the abx and he can tell it's helping. The only issue pt has is that his finger is still hurting. Pt said it's achy and occ has a sharp pain go through it. Pt was advise due to elevated liver labs in the past to no take to much tylenol. Pt is still taking Aleve but it's not helping much and is wanting something just a little stronger to help with the pain while finger is healing. Pt said he isn't going to call Dr. Debby Bud office for an appt because had is improving and just wants PCP to send in temp pain med  [facility]

## 2020-10-02 NOTE — Telephone Encounter (Signed)
Left VM letting pt know Rx sent and advised of Dr. Royden Purl comments/ recommendations

## 2020-10-02 NOTE — Telephone Encounter (Signed)
Please send in meloxicam 15 mg one po qd prn with food #15 no ref  I do think he needs to still f/u with the surgeon but of course that is up to him I am out of town/out of the office currently so I can not see him thanks

## 2020-10-02 NOTE — Telephone Encounter (Signed)
I reviewed the ER note and it sounds like he was supposed to see hand surgeon that am (Dr Izora Ribas).  Did he go? Did they do anything else or change the abx?

## 2020-10-04 LAB — CULTURE, BLOOD (ROUTINE X 2)
Culture: NO GROWTH
Culture: NO GROWTH
Special Requests: ADEQUATE

## 2020-10-23 ENCOUNTER — Telehealth: Payer: Self-pay | Admitting: Family Medicine

## 2020-10-23 MED ORDER — CEPHALEXIN 500 MG PO CAPS: 500.0000 mg | ORAL_CAPSULE | Freq: Four times a day (QID) | ORAL | 0 refills | Status: DC

## 2020-10-23 NOTE — Telephone Encounter (Signed)
Pt returned call. Advised him that CMA is at lunch and they will reach back out. Pt verbalized understanding and stated he will keep his phone on him. Would have had triage speak with him, but no one available.

## 2020-10-23 NOTE — Telephone Encounter (Signed)
Pt notified Rx sent to pharmacy. Pt doesn't have any sever sxs of infection no fever or streaking. Pt said he got the finger dirty and it's a little red and swollen, pt wanted to go a head and start on abx so it wont turn into a sever infection. I advise pt to try and keep finger clean and make sure he's cleaning it with soap and water especially when he thinks it's dirty. Pt will start abx and keep specialist appt as planned.  FYI to PCP

## 2020-10-23 NOTE — Telephone Encounter (Signed)
I reviewed ER records and am very glad he has decided to see the hand specialist   I pended keflex to send to pharm of choice  Is the finger red/swollen?  What symptoms of infection does he have?   If fever or severe pain or any streaks going up into proximal finger or hand will need emergent attn at ER

## 2020-10-23 NOTE — Telephone Encounter (Signed)
Mr. Powley called in wanted to know if he can get some more Antibiotic for his finger due to its looking like its getting infected again and the nail fell off and he has an appointment with the hand surgeon Dr. Kristine Linea on 8/15 @ 930 and the office told him to call his PCP to see if can get more of the antibiotic until his appointment, and the medicine is Keflex and its generic brand.

## 2020-10-23 NOTE — Telephone Encounter (Signed)
Left VM requesting pt to call the office back 

## 2020-10-23 NOTE — Addendum Note (Signed)
Addended by: Roxy Manns A on: 10/23/2020 10:53 AM   Modules accepted: Orders

## 2020-10-23 NOTE — Addendum Note (Signed)
Addended by: Shon Millet on: 10/23/2020 02:25 PM   Modules accepted: Orders

## 2020-10-24 ENCOUNTER — Other Ambulatory Visit: Payer: Self-pay | Admitting: Family Medicine

## 2020-10-24 NOTE — Telephone Encounter (Signed)
Please re send it

## 2020-10-24 NOTE — Telephone Encounter (Signed)
Pt called and stated he accidentally left the medication sitting on his truck and drove off. He was not able to find it. He is wondering if we are able to send in another prescription or what he needs to do. Please advise.

## 2020-10-27 ENCOUNTER — Telehealth: Payer: Self-pay

## 2020-10-27 NOTE — Telephone Encounter (Signed)
[facility] Night - Client TELEPHONE ADVICE RECORD [facility] Patient Name: Glenn Stewart Gender: Male DOB: 09-Feb-1966 Age: 55 Y 2 M 4 D Return Phone Number: [phone] (Primary) Address: City/ State/ Zip: [place] [address] Client [facility] Night - Client Client Site [facility] - Night Physician [place], Idamae Schuller - MD Contact Type Call Who Is Calling Patient / Member / Family / Caregiver Call Type Triage / Clinical Relationship To Patient Self Return Phone Number [phone] (Primary) Chief Complaint Cuts and Lacerations Reason for Call Symptomatic / Request for Health Information Initial Comment Caller states he misplaced his prescription for antibiotic. Caller states he cut his finger and it's getting infected. Translation No Nurse Assessment Nurse: Jetty Peeks, RN, Lillia Abed Date/Time (Eastern Time): 10/25/2020 1:24:50 PM Confirm and document reason for call. If symptomatic, describe symptoms. ---Caller states he injured his finger on the 7/11 and went to the ED and was then told to f/u with a hand surgeon. States the Hydrographic surveyor can't see him until 8/15, so he called the office on Thursday and was rx'd Keflex over the phone and after taking one pill accidentally drive off with the bottle in the back of his truck and lost the rx. Request refill. States he called office yesterday and was told they were busy. States sx are the same from when he called. No fever. Does the patient have any new or worsening symptoms? ---Yes Will a triage be completed? ---Yes Related visit to physician within the last 2 weeks? ---Yes Does the PT have any chronic conditions? (i.e. diabetes, asthma, this includes High risk factors for pregnancy, etc.) ---No Is this a behavioral health or substance abuse call? ---No Guidelines Guideline Title Affirmed Question Affirmed Notes Nurse Date/Time  (Eastern Time) Wound Infection [1] Red area or streak AND [2] no fever Weiss-Hilton, RN, Lillia Abed 10/25/2020 1:29:51 PM Disp. Time Lamount Cohen Time) Disposition Final User PLEASE NOTE: All timestamps contained within this report are represented as [place] Standard Time. CONFIDENTIALTY NOTICE: This fax transmission is intended only for the addressee. It contains information that is legally privileged, confidential or otherwise protected from use or disclosure. If you are not the intended recipient, you are strictly prohibited from reviewing, disclosing, copying using or disseminating any of this information or taking any action in reliance on or regarding this information. If you have received this fax in error, please notify us immediately by telephone so that we can arrange for its return to [place]. Phone: [phone], Toll-Free: [phone], Fax: [phone] Page: 2 of 2 Call Id: [id number] 10/25/2020 1:34:42 PM See PCP within 24 Hours Yes Weiss-Hilton, RN, Augustina Mood Disagree/Comply Disagree Caller Understands Yes PreDisposition InappropriateToAsk Care Advice Given Per Guideline * For OPEN cuts or scrapes, soak in warm water or put warm wet cloth on wound for 20 minutes 3 times per day. Use a warm saline solution containing 2 teaspoons (10 ml) table salt per quart (1 liter) of water. SEE PCP WITHIN 24 HOURS: * IF OFFICE WILL BE CLOSED: You need to be seen within the next 24 hours. A clinic or an urgent care center is often a good source of care if your doctor's office is closed or you can't get an appointment. ANTIBIOTIC OINTMENT: * Put a small amount of antibiotic ointment on the wound 3 times a day. CALL BACK IF: * Fever occurs * You become worse CARE ADVICE per Wound Infection (Adult) guideline. Comments User: Jovita Kussmaul, RN Date/Time Lamount Cohen Time): 10/25/2020  1:29:16 PM Crhonic condition HTN User: Jovita Kussmaul, RN Date/Time Lamount Cohen Time): 10/25/2020 1:35:23 PM RN  advised caller that per client directives no medications are called in after hours. Caller stated he is going to wait until Monday to f/u with the office and won't be seen per final dispo. Referrals GO TO FACILITY REFUSED

## 2020-10-27 NOTE — Telephone Encounter (Signed)
Rx resent.

## 2020-10-27 NOTE — Telephone Encounter (Signed)
Pt called this morning and this has been taken care of. Rx was resent to pharmacy

## 2020-11-03 ENCOUNTER — Ambulatory Visit (INDEPENDENT_AMBULATORY_CARE_PROVIDER_SITE_OTHER): Payer: Self-pay

## 2020-11-03 ENCOUNTER — Encounter (HOSPITAL_COMMUNITY): Payer: Self-pay | Admitting: Emergency Medicine

## 2020-11-03 ENCOUNTER — Ambulatory Visit (HOSPITAL_COMMUNITY)
Admission: EM | Admit: 2020-11-03 | Discharge: 2020-11-03 | Disposition: A | Discharge status: Home / Self Care | Payer: Self-pay | Attending: Emergency Medicine | Admitting: Emergency Medicine

## 2020-11-03 ENCOUNTER — Other Ambulatory Visit: Payer: Self-pay

## 2020-11-03 DIAGNOSIS — L03011 Cellulitis of right finger: Secondary | ICD-10-CM

## 2020-11-03 DIAGNOSIS — L089 Local infection of the skin and subcutaneous tissue, unspecified: Secondary | ICD-10-CM

## 2020-11-03 MED ORDER — CEPHALEXIN 500 MG PO CAPS: 500.0000 mg | ORAL_CAPSULE | Freq: Four times a day (QID) | ORAL | 0 refills | Status: DC

## 2020-11-03 NOTE — Discharge Instructions (Addendum)
Take the Keflex as prescribed.   Do not eat anything after midnight tonight.    Someone from Dr. Thomos Lemons office will contact you about possible surgery tomorrow.    Return or go to the Emergency Department if symptoms worsen or do not improve in the next few days.

## 2020-11-03 NOTE — ED Triage Notes (Signed)
Pt presents with finger pain.

## 2020-11-03 NOTE — ED Provider Notes (Signed)
[facility]    CSN: [id number] Arrival date & time: 11/03/20  1600      History   Chief Complaint Chief Complaint  Patient presents with   Hand Pain    HPI Glenn Stewart is a 55 y.o. male.   Patient here for further evaluation of right index finger infection.  Reports being evaluated by hand and was told to come to UC for further evaluation as he may need surgery tonight.  Reports finger infection started in July and was initially evaluated in the ED.  Reports being on antibiotics but states that he has finished them.  Reports pain is achy and worse with movement.  Reports swelling gets worse throughout the day.  Denies any fevers, chest pain, shortness of breath, N/V/D, numbness, tingling, weakness, abdominal pain, or headaches.    The history is provided by the patient.  Hand Pain   Past Medical History:  Diagnosis Date   ADHD (attention deficit hyperactivity disorder)    Anxiety state, unspecified    Bipolar 1 disorder (HCC)    CHEST PAIN 12/04/2008   Qualifier: Diagnosis of  By: Milinda Antis MD, Colon Flattery    Depressive disorder, not elsewhere classified    Dermatophytosis of nail    Esophageal reflux    HYPERGLYCEMIA 07/10/2007   Qualifier: Diagnosis of  By: Milinda Antis MD, Colon Flattery    Kidney stones 09/22/2010   Low back pain 09/18/2013   Other chronic nonalcoholic liver disease    Problems with hearing    Prostatitis, unspecified    Pure hypercholesterolemia    Screen for STD (sexually transmitted disease) 04/20/2011   Sprain of cruciate ligament of knee    Tear of medial cartilage or meniscus of knee, current    TMJ (temporomandibular joint disorder) 12/25/2010   Unspecified essential hypertension    Unspecified gastritis and gastroduodenitis without mention of hemorrhage    Urethritis, unspecified     Patient Active Problem List   Diagnosis Date Noted   Hand paresthesia 04/03/2020   Frequent urination 04/03/2020   Prostate cancer screening 04/03/2020   CAD  (coronary artery disease) 04/01/2020   Low back pain 09/18/2013   Screen for STD (sexually transmitted disease) 04/20/2011   TMJ (temporomandibular joint disorder) 12/25/2010   Kidney stones 09/22/2010   Hematuria 09/22/2010   Routine general medical examination at a health care facility 07/22/2010   TINEA VERSICOLOR 10/29/2009   MEDIAL MENISCUS TEAR, LEFT 05/31/2008   ACL TEAR, LEFT KNEE 05/31/2008   Pain in joint, forearm 05/28/2008   PROBLEMS WITH HEARING 03/18/2008   Prediabetes 07/10/2007   DERMATOPHYTOSIS OF NAIL 04/10/2007   Dyslipidemia, goal LDL below 70 09/06/2006   ANXIETY 09/06/2006   DEPRESSION 09/06/2006   Essential hypertension 09/06/2006   GERD 09/06/2006   FATTY LIVER DISEASE 09/06/2006    Past Surgical History:  Procedure Laterality Date   No prior surgery         Home Medications    Prior to Admission medications   Medication Sig Start Date End Date Taking? Authorizing Provider  cephALEXin (KEFLEX) 500 MG capsule Take 1 capsule (500 mg total) by mouth 4 (four) times daily. 11/03/20  Yes Ivette Loyal, NP  ALPRAZolam Prudy Feeler) 1 MG tablet Take 1 tablet by mouth Once daily as needed for anxiety.  04/17/11   [provider]  amLODipine (NORVASC) 10 MG tablet Take 10 mg by mouth daily.    [provider]  aspirin 81 MG chewable tablet Chew 1 tablet (  81 mg total) by mouth daily. 03/20/20   Couture, Cortni S, PA-C  fish oil-omega-3 fatty acids 1000 MG capsule Take 2 g by mouth daily.    [provider]  isosorbide mononitrate (IMDUR) 30 MG 24 hr tablet Take 1 tablet (30 mg total) by mouth daily. 04/01/20   Abelino Derrick, PA-C  meloxicam (MOBIC) 15 MG tablet Take 1 tablet (15 mg total) by mouth daily as needed for pain (with food). 10/02/20   Tower, Audrie Gallus, MD  metoprolol succinate (TOPROL XL) 25 MG 24 hr tablet Take 1 tablet (25 mg total) by mouth daily. 05/29/20   Abelino Derrick, PA-C  Multiple Vitamin (MULTIVITAMIN) capsule Take 1  capsule by mouth daily.    [provider]  nitroGLYCERIN (NITROSTAT) 0.4 MG SL tablet Place 1 tablet (0.4 mg total) under the tongue every 5 (five) minutes as needed for chest pain. 03/20/20   Couture, Cortni S, PA-C  sertraline (ZOLOFT) 100 MG tablet Take 100 mg by mouth daily.    [provider]  simvastatin (ZOCOR) 20 MG tablet TAKE ONE TABLET BY MOUTH EVERY NIGHT AT BEDTIME 05/22/20   Lewayne Bunting, MD    Family History Family History  Problem Relation Age of Onset   Diabetes Father    Hypertension Father    Heart failure Father     Social History Social History   Tobacco Use   Smoking status: Never   Smokeless tobacco: Never  Vaping Use   Vaping Use: Never used  Substance Use Topics   Alcohol use: Yes    Comment: rare   Drug use: No     Allergies   Sulfonamide derivatives   Review of Systems Review of Systems  Musculoskeletal:  Positive for joint swelling and myalgias.  All other systems reviewed and are negative.   Physical Exam Triage Vital Signs ED Triage Vitals  Enc Vitals Group     BP 11/03/20 1733 (!) 163/105     Pulse Rate 11/03/20 1733 75     Resp 11/03/20 1733 18     Temp 11/03/20 1733 98.2 F (36.8 C)     Temp Source 11/03/20 1733 Oral     SpO2 11/03/20 1733 98 %     Weight --      Height --      Head Circumference --      Peak Flow --      Pain Score 11/03/20 1730 0     Pain Loc --      Pain Edu? --      Excl. in GC? --    No data found.  Updated Vital Signs BP (!) 163/105 (BP Location: Right Arm)   Pulse 75   Temp 98.2 F (36.8 C) (Oral)   Resp 18   SpO2 98%   Visual Acuity Right Eye Distance:   Left Eye Distance:   Bilateral Distance:    Right Eye Near:   Left Eye Near:    Bilateral Near:     Physical Exam Vitals and nursing note reviewed.  Constitutional:      General: He is not in acute distress.    Appearance: Normal appearance. He is not ill-appearing, toxic-appearing or diaphoretic.  HENT:      Head: Normocephalic and atraumatic.  Eyes:     Conjunctiva/sclera: Conjunctivae normal.  Cardiovascular:     Rate and Rhythm: Normal rate.     Pulses: Normal pulses.  Pulmonary:     Effort: Pulmonary effort  is normal.  Abdominal:     General: Abdomen is flat.  Musculoskeletal:     Right hand: Deformity, tenderness and bony tenderness present. Decreased range of motion. Decreased capillary refill (index finger).     Cervical back: Normal range of motion.  Skin:    General: Skin is warm and dry.  Neurological:     General: No focal deficit present.     Mental Status: He is alert and oriented to person, place, and time.  Psychiatric:        Mood and Affect: Mood normal.        UC Treatments / Results  Labs (all labs ordered are listed, but only abnormal results are displayed) Labs Reviewed - No data to display  EKG   Radiology No results found.  Procedures Procedures (including critical care time)  Medications Ordered in UC Medications - No data to display  Initial Impression / Assessment and Plan / UC Course  I have reviewed the triage vital signs and the nursing notes.  Pertinent labs & imaging results that were available during my care of the patient were reviewed by me and considered in my medical decision making (see chart for details).    Discussed case with Dr. Arita Miss.  Xray obtained.  Referral to plastic surgery placed.  Will prescribe Keflex for the next 5 days.  Patient was instructed to not eat anything after midnight tonight for possible surgery tomorrow.  Patient verbalized understanding.  Follow up scheduled.  Final Clinical Impressions(s) / UC Diagnoses   Final diagnoses:  Finger infection     Discharge Instructions      Take the Keflex as prescribed.   Do not eat anything after midnight tonight.    Someone from Dr. Thomos Lemons office will contact you about possible surgery tomorrow.    Return or go to the Emergency Department if symptoms  worsen or do not improve in the next few days.      ED Prescriptions     Medication Sig Dispense Auth. Provider   cephALEXin (KEFLEX) 500 MG capsule Take 1 capsule (500 mg total) by mouth 4 (four) times daily. 20 capsule Ivette Loyal, NP      PDMP not reviewed this encounter.   Ivette Loyal, NP 11/03/20 [phone]

## 2020-11-04 ENCOUNTER — Encounter (HOSPITAL_BASED_OUTPATIENT_CLINIC_OR_DEPARTMENT_OTHER): Payer: Self-pay

## 2020-11-04 ENCOUNTER — Ambulatory Visit (HOSPITAL_BASED_OUTPATIENT_CLINIC_OR_DEPARTMENT_OTHER): Admit: 2020-11-04 | Payer: Self-pay | Admitting: Plastic Surgery

## 2020-11-04 SURGERY — AMPUTATION DIGIT
Anesthesia: Choice | Site: Index Finger | Laterality: Right

## 2020-11-05 ENCOUNTER — Institutional Professional Consult (permissible substitution): Payer: Self-pay | Admitting: Plastic Surgery

## 2020-11-19 ENCOUNTER — Encounter: Payer: Self-pay | Admitting: Plastic Surgery

## 2020-11-22 NOTE — Progress Notes (Signed)
Cardiology Office Note:    Date:  11/28/2020   ID:  [name], DOB May 19, 1965, MRN [id number]  PCP:  Judy Pimple, MD  Cardiologist:  Olga Millers, MD  Electrophysiologist:  None   Referring MD: Judy Pimple, MD   Chief Complaint: follow-up of elevated coronary calcium score  History of Present Illness:    Glenn Stewart is a 55 y.o. male with a history of chest pain with coronary calcium score of 2,076 (99th percentile for age/sex) but negative ETT in 08/2017, hypertension, hyperlipidemia, ADHD, and bipolar disorder who is followed by Dr. Jens Som and presents today for routine follow-up of elevated coronary calcium score and history of chest pain.  Patient has a history of chest pain and has been worked up for this in the past. He had a negative stress Echo in 2010. In 08/2017, coronary calcium score came back elevated at 2,076 which put him in the 99th percentile for age and sex. At that time, coronary angiogram was recommended but patient was concerned about cost. However,  ETT aroudn this time was negative. He was seen in the ED in 02/2020 with left sided chest pain. Work-up included EKG, troponin, and D-dimer were unremarkable. He was placed on Imdur and has done well since then. He was last seen by Corine Shelter, PA-C, in 05/2020 at which time he denied any chest pain or unusual dyspnea.   Since last visit, he has struggled with a right index finger infection since 09/2020. He presented to the ED for further evaluation of this on 11/03/2020. He was prescribed Keflex and referred to Plastic Surgery.  Patient presents today for follow-up. Here today alone. Patient has done very well from a cardiac standpoint. He denies any recurrent chest pain, shortness of breath, orthopnea, PND, lower extremity edema, palpitations, lightheadedness/dizziness, syncope. 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 He states he can "push himself" without having chest pain or fatigue. He  has not had to use any of his sublingual Nitro.  Past Medical History:  Diagnosis Date   ADHD (attention deficit hyperactivity disorder)    Anxiety state, unspecified    Bipolar 1 disorder (HCC)    CHEST PAIN 12/04/2008   Qualifier: Diagnosis of  By: Milinda Antis MD, Colon Flattery    Depressive disorder, not elsewhere classified    Dermatophytosis of nail    Esophageal reflux    HYPERGLYCEMIA 07/10/2007   Qualifier: Diagnosis of  By: Milinda Antis MD, Colon Flattery    Kidney stones 09/22/2010   Low back pain 09/18/2013   Other chronic nonalcoholic liver disease    Problems with hearing    Prostatitis, unspecified    Pure hypercholesterolemia    Screen for STD (sexually transmitted disease) 04/20/2011   Sprain of cruciate ligament of knee    Tear of medial cartilage or meniscus of knee, current    TMJ (temporomandibular joint disorder) 12/25/2010   Unspecified essential hypertension    Unspecified gastritis and gastroduodenitis without mention of hemorrhage    Urethritis, unspecified     Past Surgical History:  Procedure Laterality Date   No prior surgery      Current Medications: Current Meds  Medication Sig   ALPRAZolam (XANAX) 1 MG tablet Take 1 tablet by mouth Once daily as needed for anxiety.    amLODipine (NORVASC) 10 MG tablet Take 10 mg by mouth daily.   aspirin 81 MG chewable tablet Chew 1 tablet (81 mg total) by mouth daily.  fish oil-omega-3 fatty acids 1000 MG capsule Take 2 g by mouth daily.   isosorbide mononitrate (IMDUR) 30 MG 24 hr tablet Take 1 tablet (30 mg total) by mouth daily.   metoprolol succinate (TOPROL XL) 25 MG 24 hr tablet Take 1 tablet (25 mg total) by mouth daily.   Multiple Vitamin (MULTIVITAMIN) capsule Take 1 capsule by mouth daily.   nitroGLYCERIN (NITROSTAT) 0.4 MG SL tablet Place 1 tablet (0.4 mg total) under the tongue every 5 (five) minutes as needed for chest pain.   sertraline (ZOLOFT) 100 MG tablet Take 100 mg by mouth daily.   simvastatin (ZOCOR) 20 MG  tablet TAKE ONE TABLET BY MOUTH EVERY NIGHT AT BEDTIME     Allergies:   Sulfonamide derivatives   Social History   Socioeconomic History   Marital status: Single    Spouse name: Not on file   Number of children: 0   Years of education: Not on file   Highest education level: Not on file  Occupational History   Occupation: Owns motorcycle shop  Tobacco Use   Smoking status: Never   Smokeless tobacco: Never  Vaping Use   Vaping Use: Never used  Substance and Sexual Activity   Alcohol use: Yes    Comment: rare   Drug use: No   Sexual activity: Not on file  Other Topics Concern   Not on file  Social History Narrative   Not on file   Social Determinants of Health   Financial Resource Strain: Not on file  Food Insecurity: Not on file  Transportation Needs: Not on file  Physical Activity: Not on file  Stress: Not on file  Social Connections: Not on file     Family History: The patient's family history includes Diabetes in his father; Heart failure in his father; Hypertension in his father.  ROS:   Please see the history of present illness.     EKGs/Labs/Other Studies Reviewed:    The following studies were reviewed today:  Exercise Tolerance Test 08/30/2017: The patient walked for 7 minutes and 57 seconds of a standard Bruce protocol treadmill test. He achieved a peak heart rate of 144 which is 86% predicted maximal heart rate. At peak exercise there were no ST or T wave changes. Blood pressure response to exercise was normal. This is interpreted as a negative stress test. _______________  CT Cardiac Score 09/01/2017: Coronary calcium score of 2076. This was 1699 percentile for age and sex matched control.  EKG:  EKG not ordered today.   Recent Labs: 09/29/2020: ALT 56; BUN 12; Creatinine, Ser 0.80; Hemoglobin 13.8; Platelets 279; Potassium 3.7; Sodium 137  Recent Lipid Panel    Component Value Date/Time   CHOL 140 04/03/2020 1146   TRIG 145.0 04/03/2020 1146    HDL 39.70 04/03/2020 1146   CHOLHDL 4 04/03/2020 1146   VLDL 29.0 04/03/2020 1146   LDLCALC 72 04/03/2020 1146   LDLDIRECT 117.5 04/10/2012 1012    Physical Exam:    Vital Signs: BP (!) 166/80 (BP Location: Left Arm, Patient Position: Sitting, Cuff Size: Normal)   Pulse 99   Resp 20   Ht 5\' 6"  (1.676 m)   Wt 216 lb 9.6 oz (98.2 kg)   SpO2 95%   BMI 34.96 kg/m     Wt Readings from Last 3 Encounters:  11/28/20 216 lb 9.6 oz (98.2 kg)  09/30/20 200 lb (90.7 kg)  05/29/20 220 lb (99.8 kg)     General: 55 y.o. Caucasian male  in no acute distress. HEENT: Normocephalic and atraumatic. Sclera clear.  Neck: Supple. No carotid bruits. No JVD. Heart: RRR. Distinct S1 and S2. No murmurs, gallops, or rubs. Radial pulses 2+ and equal bilaterally. Lungs: No increased work of breathing. Clear to ausculation bilaterally. No wheezes, rhonchi, or rales.  Abdomen: Soft, non-distended, and non-tender to palpation.  Extremities: No lower extremity edema.    Skin: Warm and dry. Neuro: Alert and oriented x3. No focal deficits. Psych: Normal affect. Responds appropriately.  Assessment:    1. History of chest pain   2. Elevated coronary artery calcium score   3. Primary hypertension   4. Hyperlipidemia, unspecified hyperlipidemia type     Plan:    History of Chest Pain Elevated Coronary Calcium Score - Patient has history of chest pain. Coronary calcium score elevated at 2,076 (99th percentile for age/sex) in 08/2017. Coronary angiogram recommended at that time but patient was concerned about cost. However, ETT around this time was negative. - No angina.  - Continue antianginals: Toprol-XL 25mg  daily, Imdur 30mg  daily, and Amlodipine 10mg  daily.  - Continue aspirin and statin.   Hypertension - BP elevated in office at 166/80 but patient was rushing to appointment after a flat tire. I personally rechecked his BP at the end of his visit and it had improved to 132/90. - Continue Amlodipine,  Toprol-XL, and Imdur as above. - Have asked patient to keep BP/HR for 2 weeks and then send this do via MyChart. If average BP above goal of <130/80, will likely switch his Toprol-XL 25mg  daily to Coreg 6.25mg  twice daily for additional BP and HR control.  Hyperlipidemia - Lipid panel from 03/2020: Total Cholesterol 140, Triglycerides 145, HDL 39.70, LDL 72.  - Slightly above goal of <70 given CAD. - Continue Simvastatin 20mg  daily. - Labs followed by PCP. If LDL still above goal at next check, would transition to high-intensity statin given elevated coronary calcium score.  Disposition: Follow up in 1 year.    Medication Adjustments/Labs and Tests Ordered: Current medicines are reviewed at length with the patient today.  Concerns regarding medicines are outlined above.  No orders of the defined types were placed in this encounter.  No orders of the defined types were placed in this encounter.   Patient Instructions  Medication Instructions:  Your physician recommends that you continue on your current medications as directed. Please refer to the Current Medication list given to you today.  *If you need a refill on your cardiac medications before your next appointment, please call your pharmacy*  Lab Work: NONE ordered at this time of appointment   If you have labs (blood work) drawn today and your tests are completely normal, you will receive your results only by: MyChart Message (if you have MyChart) OR A paper copy in the mail If you have any lab test that is abnormal or we need to change your treatment, we will call you to review the results.  Testing/Procedures: NONE ordered at this time of appointment   Follow-Up: At [facility], you and your health needs are our priority.  As part of our continuing mission to provide you with exceptional heart care, we have created designated Provider Care Teams.  These Care Teams include your primary Cardiologist (physician) and  Advanced Practice Providers (APPs -  Physician Assistants and Nurse Practitioners) who all work together to provide you with the care you need, when you need it.  Your next appointment:   1 year(s)  The  format for your next appointment:   In Person  Provider:   Marjie Skiff, PA-C  Other Instructions MONITOR blood pressure and heart rate at home for 2 weeks. Call our office or send a MyChart message with the readings.    Signed, Corrin Parker, PA-C  11/28/2020 2:44 PM     [facility]

## 2020-11-28 ENCOUNTER — Encounter: Payer: Self-pay | Admitting: Student

## 2020-11-28 ENCOUNTER — Other Ambulatory Visit: Payer: Self-pay

## 2020-11-28 ENCOUNTER — Ambulatory Visit (INDEPENDENT_AMBULATORY_CARE_PROVIDER_SITE_OTHER): Payer: Self-pay | Admitting: Student

## 2020-11-28 VITALS — BP 166/80 | HR 99 | Resp 20 | Ht 66.0 in | Wt 216.6 lb

## 2020-11-28 DIAGNOSIS — E785 Hyperlipidemia, unspecified: Secondary | ICD-10-CM

## 2020-11-28 DIAGNOSIS — I10 Essential (primary) hypertension: Secondary | ICD-10-CM

## 2020-11-28 DIAGNOSIS — R931 Abnormal findings on diagnostic imaging of heart and coronary circulation: Secondary | ICD-10-CM

## 2020-11-28 DIAGNOSIS — Z87898 Personal history of other specified conditions: Secondary | ICD-10-CM

## 2020-11-28 NOTE — Patient Instructions (Addendum)
Medication Instructions:  Your physician recommends that you continue on your current medications as directed. Please refer to the Current Medication list given to you today.  *If you need a refill on your cardiac medications before your next appointment, please call your pharmacy*  Lab Work: NONE ordered at this time of appointment   If you have labs (blood work) drawn today and your tests are completely normal, you will receive your results only by: MyChart Message (if you have MyChart) OR A paper copy in the mail If you have any lab test that is abnormal or we need to change your treatment, we will call you to review the results.  Testing/Procedures: NONE ordered at this time of appointment   Follow-Up: At [facility], you and your health needs are our priority.  As part of our continuing mission to provide you with exceptional heart care, we have created designated Provider Care Teams.  These Care Teams include your primary Cardiologist (physician) and Advanced Practice Providers (APPs -  Physician Assistants and Nurse Practitioners) who all work together to provide you with the care you need, when you need it.  Your next appointment:   1 year(s)  The format for your next appointment:   In Person  Provider:   Marjie Skiff, PA-C  Other Instructions MONITOR blood pressure and heart rate at home for 2 weeks. Call our office or send a MyChart message with the readings.

## 2020-12-10 ENCOUNTER — Other Ambulatory Visit (HOSPITAL_COMMUNITY): Payer: Self-pay | Admitting: Urology

## 2020-12-10 DIAGNOSIS — R109 Unspecified abdominal pain: Secondary | ICD-10-CM

## 2020-12-18 ENCOUNTER — Ambulatory Visit (HOSPITAL_COMMUNITY)
Admission: RE | Admit: 2020-12-18 | Discharge: 2020-12-18 | Disposition: A | Discharge status: Home / Self Care | Payer: Self-pay | Source: Ambulatory Visit | Attending: Urology | Admitting: Urology

## 2020-12-18 DIAGNOSIS — R109 Unspecified abdominal pain: Secondary | ICD-10-CM | POA: Insufficient documentation

## 2021-01-26 ENCOUNTER — Other Ambulatory Visit: Payer: Self-pay

## 2021-01-26 MED ORDER — METOPROLOL SUCCINATE ER 25 MG PO TB24: 25.0000 mg | ORAL_TABLET | Freq: Every day | ORAL | 3 refills | Status: DC

## 2021-02-11 ENCOUNTER — Other Ambulatory Visit: Payer: Self-pay

## 2021-02-11 MED ORDER — ISOSORBIDE MONONITRATE ER 30 MG PO TB24
30.0000 mg | ORAL_TABLET | Freq: Every day | ORAL | 3 refills | Status: DC
Start: 2021-02-11 — End: 2021-02-19

## 2021-02-19 ENCOUNTER — Other Ambulatory Visit: Payer: Self-pay

## 2021-02-19 MED ORDER — ISOSORBIDE MONONITRATE ER 30 MG PO TB24: 30.0000 mg | ORAL_TABLET | Freq: Every day | ORAL | 3 refills | Status: DC

## 2021-03-02 ENCOUNTER — Telehealth: Payer: Self-pay | Admitting: Student

## 2021-03-02 MED ORDER — NITROGLYCERIN 0.4 MG SL SUBL: 0.4000 mg | SUBLINGUAL_TABLET | SUBLINGUAL | 3 refills | Status: DC | PRN

## 2021-03-02 NOTE — Telephone Encounter (Signed)
*  STAT* If patient is at the pharmacy, call can be transferred to refill team.   1. Which medications need to be refilled? (please list name of each medication and dose if known)  nitroGLYCERIN (NITROSTAT) 0.4 MG SL tablet  2. Which pharmacy/location (including street and city if local pharmacy) is medication to be sent to? [id number] - , [place] - [address]  3. Do they need a 30 day or 90 day supply? 30 with refills   Patient's only bottle expired over a year ago

## 2021-03-02 NOTE — Telephone Encounter (Signed)
Refills for Nitroglycerin sent to [facility] 03/02/21

## 2021-03-27 ENCOUNTER — Other Ambulatory Visit: Payer: Self-pay | Admitting: Student

## 2021-03-30 NOTE — Telephone Encounter (Signed)
This is Dr. Crenshaw's pt 

## 2021-04-16 ENCOUNTER — Other Ambulatory Visit: Payer: Self-pay | Admitting: Cardiology

## 2022-03-09 ENCOUNTER — Other Ambulatory Visit: Payer: Self-pay | Admitting: Cardiology

## 2022-03-10 ENCOUNTER — Telehealth: Payer: Self-pay | Admitting: Cardiology

## 2022-03-10 MED ORDER — ISOSORBIDE MONONITRATE ER 30 MG PO TB24: 30.0000 mg | ORAL_TABLET | Freq: Every day | ORAL | 1 refills | Status: DC

## 2022-03-10 MED ORDER — METOPROLOL SUCCINATE ER 25 MG PO TB24: 25.0000 mg | ORAL_TABLET | Freq: Every day | ORAL | 1 refills | Status: DC

## 2022-03-10 NOTE — Telephone Encounter (Signed)
*  STAT* If patient is at the pharmacy, call can be transferred to refill team.   1. Which medications need to be refilled? (please list name of each medication and dose if known)   isosorbide mononitrate (IMDUR) 30 MG 24 hr tablet    metoprolol succinate (TOPROL-XL) 25 MG 24 hr tablet    2. Which pharmacy/location (including street and city if local pharmacy) is medication to be sent to?  [id number] - [place], [place] - [address]    3. Do they need a 30 day or 90 day supply?  90 day   Pt has scheduled appt on 04/20/22. Please advise

## 2022-04-06 NOTE — Progress Notes (Deleted)
Cardiology Office Note:    Date:  04/06/2022   ID:  Glenn Stewart, DOB September 21, 1965, MRN [id number]  PCP:  Abner Greenspan, MD  Cardiologist:  Kirk Ruths, MD  Electrophysiologist:  None   Referring MD: Abner Greenspan, MD   Chief Complaint: follow-up of elevated calcium score  History of Present Illness:    Glenn Stewart is a 57 y.o. male with a history of f chest pain with coronary calcium score of 2,076 (99th percentile for age/sex) but negative ETT in 08/2017, hypertension, hyperlipidemia, ADHD, and bipolar disorder who is followed by Dr. Stanford Breed and presents today for routine follow-up.  Patient has a history of chest pain and has been worked up for this in the past. He had a negative stress Echo in 2010. In 08/2017, coronary calcium score came back elevated at 2,076 which put him in the 99th percentile for age and sex. At that time, coronary angiogram was recommended but patient was concerned about cost. However, ETT aroudn this time was negative. He was seen in the ED in 02/2020 with left sided chest pain. Work-up included EKG, troponin, and D-dimer were unremarkable. He was placed on Imdur and has done well since then. He was last seen by Kerin Ransom, PA-C, in 05/2020 at which time he denied any chest pain or unusual dyspnea.   He was last seen by me in 11/2020 at which time he was doing very well from a cardiac standpoint with no chest pian or shortness of breath.   Patient presents today for follow-up. ***  History of Chest Pain Elevated Coronary Calcium Score Patient has history of chest pain. Coronary calcium score elevated at 2,076 (99th percentile for age/sex) in 08/2017. Coronary angiogram recommended at that time but patient was concerned about cost. However, ETT around this time was negative. - No angina. *** - Continue antianginals: Toprol-XL '25mg'$  daily, Imdur '30mg'$  daily, and Amlodipine '10mg'$  daily.  - Continue aspirin and statin.    Hypertension BP well controlled.  *** - Continue Amlodipine, Toprol-XL, and Imdur as above.   Hyperlipidemia Lipid panel from 03/2020: Total Cholesterol 140, Triglycerides 145, HDL 39.70, LDL 72. LDL goal <70 given CAD. - Continue Simvastatin '20mg'$  daily. - Will repeat lipid panel and LFTs. If LDL still above goal, will plan to transition to high-intensity statin given elevated coronary calcium score. ***   Past Medical History:  Diagnosis Date   ADHD (attention deficit hyperactivity disorder)    Anxiety state, unspecified    Bipolar 1 disorder ([place])    CHEST PAIN 12/04/2008   Qualifier: Diagnosis of  By: Glori Bickers MD, Carmell [place]    Depressive disorder, not elsewhere classified    Dermatophytosis of nail    Esophageal reflux    HYPERGLYCEMIA 07/10/2007   Qualifier: Diagnosis of  By: Glori Bickers MD, Carmell [place]    Kidney stones 09/22/2010   Low back pain 09/18/2013   Other chronic nonalcoholic liver disease    Problems with hearing    Prostatitis, unspecified    Pure hypercholesterolemia    Screen for STD (sexually transmitted disease) 04/20/2011   Sprain of cruciate ligament of knee    Tear of medial cartilage or meniscus of knee, current    TMJ (temporomandibular joint disorder) 12/25/2010   Unspecified essential hypertension    Unspecified gastritis and gastroduodenitis without mention of hemorrhage    Urethritis, unspecified     Past Surgical History:  Procedure Laterality Date   No prior surgery  Current Medications: No outpatient medications have been marked as taking for the 04/20/22 encounter (Appointment) with Darreld Mclean, PA-C.     Allergies:   Sulfonamide derivatives   Social History   Socioeconomic History   Marital status: Single    Spouse name: Not on file   Number of children: 0   Years of education: Not on file   Highest education level: Not on file  Occupational History   Occupation: Owns motorcycle shop  Tobacco Use   Smoking status: Never   Smokeless tobacco: Never  Vaping Use    Vaping Use: Never used  Substance and Sexual Activity   Alcohol use: Yes    Comment: rare   Drug use: No   Sexual activity: Not on file  Other Topics Concern   Not on file  Social History Narrative   Not on file   Social Determinants of Health   Financial Resource Strain: Not on file  Food Insecurity: Not on file  Transportation Needs: Not on file  Physical Activity: Not on file  Stress: Not on file  Social Connections: Not on file     Family History: The patient's family history includes Diabetes in his father; Heart failure in his father; Hypertension in his father.  ROS:   Please see the history of present illness.     EKGs/Labs/Other Studies Reviewed:    The following studies were reviewed:  Exercise Tolerance Test 08/30/2017: The patient walked for 7 minutes and 57 seconds of a standard Bruce protocol treadmill test. He achieved a peak heart rate of 144 which is 86% predicted maximal heart rate. At peak exercise there were no ST or T wave changes. Blood pressure response to exercise was normal. This is interpreted as a negative stress test. _______________   CT Cardiac Score 09/01/2017: Coronary calcium score of 2076. This was 69 percentile for age and sex matched control.  EKG:  EKG ordered today. EKG personally reviewed and demonstrates ***.  Recent Labs: No results found for requested labs within last 365 days.  Recent Lipid Panel    Component Value Date/Time   CHOL 140 04/03/2020 1146   TRIG 145.0 04/03/2020 1146   HDL 39.70 04/03/2020 1146   CHOLHDL 4 04/03/2020 1146   VLDL 29.0 04/03/2020 1146   LDLCALC 72 04/03/2020 1146   LDLDIRECT 117.5 04/10/2012 1012    Physical Exam:    Vital Signs: There were no vitals taken for this visit.    Wt Readings from Last 3 Encounters:  11/28/20 216 lb 9.6 oz (98.2 kg)  09/30/20 200 lb (90.7 kg)  05/29/20 220 lb (99.8 kg)     General: 57 y.o. male in no acute distress. HEENT: Normocephalic and atraumatic.  Sclera clear. EOMs intact. Neck: Supple. No carotid bruits. No JVD. Heart: *** RRR. Distinct S1 and S2. No murmurs, gallops, or rubs. Radial and distal pedal pulses 2+ and equal bilaterally. Lungs: No increased work of breathing. Clear to ausculation bilaterally. No wheezes, rhonchi, or rales.  Abdomen: Soft, non-distended, and non-tender to palpation. Bowel sounds present in all 4 quadrants.  MSK: Normal strength and tone for age. *** Extremities: No lower extremity edema.    Skin: Warm and dry. Neuro: Alert and oriented x3. No focal deficits. Psych: Normal affect. Responds appropriately.   Assessment:    No diagnosis found.  Plan:     Disposition: Follow up in ***   Medication Adjustments/Labs and Tests Ordered: Current medicines are reviewed at length with the patient today.  Concerns regarding medicines are outlined above.  No orders of the defined types were placed in this encounter.  No orders of the defined types were placed in this encounter.   There are no Patient Instructions on file for this visit.   Signed, Darreld Mclean, PA-C  04/06/2022 10:54 PM    [facility]

## 2022-04-20 ENCOUNTER — Ambulatory Visit: Payer: Self-pay | Admitting: Student

## 2022-04-28 ENCOUNTER — Other Ambulatory Visit: Payer: Self-pay | Admitting: Cardiology

## 2022-05-09 ENCOUNTER — Inpatient Hospital Stay (HOSPITAL_COMMUNITY)
Admission: EM | Admit: 2022-05-09 | Discharge: 2022-05-19 | DRG: 234 | Disposition: A | Discharge status: Home / Self Care | Payer: Medicaid Other | Attending: Thoracic Surgery (Cardiothoracic Vascular Surgery) | Admitting: Thoracic Surgery (Cardiothoracic Vascular Surgery)

## 2022-05-09 DIAGNOSIS — F32A Depression, unspecified: Secondary | ICD-10-CM | POA: Diagnosis present

## 2022-05-09 DIAGNOSIS — D62 Acute posthemorrhagic anemia: Secondary | ICD-10-CM | POA: Diagnosis not present

## 2022-05-09 DIAGNOSIS — I2582 Chronic total occlusion of coronary artery: Secondary | ICD-10-CM | POA: Diagnosis present

## 2022-05-09 DIAGNOSIS — R079 Chest pain, unspecified: Secondary | ICD-10-CM | POA: Diagnosis present

## 2022-05-09 DIAGNOSIS — Z833 Family history of diabetes mellitus: Secondary | ICD-10-CM

## 2022-05-09 DIAGNOSIS — I259 Chronic ischemic heart disease, unspecified: Principal | ICD-10-CM

## 2022-05-09 DIAGNOSIS — E8770 Fluid overload, unspecified: Secondary | ICD-10-CM | POA: Diagnosis not present

## 2022-05-09 DIAGNOSIS — Z8249 Family history of ischemic heart disease and other diseases of the circulatory system: Secondary | ICD-10-CM

## 2022-05-09 DIAGNOSIS — R0789 Other chest pain: Secondary | ICD-10-CM | POA: Diagnosis not present

## 2022-05-09 DIAGNOSIS — Z882 Allergy status to sulfonamides status: Secondary | ICD-10-CM

## 2022-05-09 DIAGNOSIS — E785 Hyperlipidemia, unspecified: Secondary | ICD-10-CM | POA: Diagnosis present

## 2022-05-09 DIAGNOSIS — I471 Supraventricular tachycardia, unspecified: Secondary | ICD-10-CM | POA: Diagnosis not present

## 2022-05-09 DIAGNOSIS — G459 Transient cerebral ischemic attack, unspecified: Secondary | ICD-10-CM | POA: Diagnosis not present

## 2022-05-09 DIAGNOSIS — I358 Other nonrheumatic aortic valve disorders: Secondary | ICD-10-CM | POA: Diagnosis present

## 2022-05-09 DIAGNOSIS — Z79899 Other long term (current) drug therapy: Secondary | ICD-10-CM

## 2022-05-09 DIAGNOSIS — Z87442 Personal history of urinary calculi: Secondary | ICD-10-CM

## 2022-05-09 DIAGNOSIS — K30 Functional dyspepsia: Secondary | ICD-10-CM | POA: Diagnosis not present

## 2022-05-09 DIAGNOSIS — K219 Gastro-esophageal reflux disease without esophagitis: Secondary | ICD-10-CM | POA: Diagnosis present

## 2022-05-09 DIAGNOSIS — E7800 Pure hypercholesterolemia, unspecified: Secondary | ICD-10-CM | POA: Diagnosis present

## 2022-05-09 DIAGNOSIS — I25110 Atherosclerotic heart disease of native coronary artery with unstable angina pectoris: Principal | ICD-10-CM | POA: Diagnosis present

## 2022-05-09 DIAGNOSIS — R457 State of emotional shock and stress, unspecified: Secondary | ICD-10-CM | POA: Diagnosis not present

## 2022-05-09 DIAGNOSIS — R55 Syncope and collapse: Secondary | ICD-10-CM | POA: Diagnosis present

## 2022-05-09 DIAGNOSIS — Z951 Presence of aortocoronary bypass graft: Secondary | ICD-10-CM

## 2022-05-09 DIAGNOSIS — Z7982 Long term (current) use of aspirin: Secondary | ICD-10-CM

## 2022-05-09 DIAGNOSIS — I2510 Atherosclerotic heart disease of native coronary artery without angina pectoris: Secondary | ICD-10-CM

## 2022-05-09 DIAGNOSIS — F909 Attention-deficit hyperactivity disorder, unspecified type: Secondary | ICD-10-CM | POA: Diagnosis present

## 2022-05-09 DIAGNOSIS — R29810 Facial weakness: Secondary | ICD-10-CM | POA: Diagnosis present

## 2022-05-09 DIAGNOSIS — I472 Ventricular tachycardia, unspecified: Secondary | ICD-10-CM | POA: Diagnosis not present

## 2022-05-09 DIAGNOSIS — I4892 Unspecified atrial flutter: Secondary | ICD-10-CM | POA: Diagnosis not present

## 2022-05-09 DIAGNOSIS — I4891 Unspecified atrial fibrillation: Secondary | ICD-10-CM | POA: Diagnosis not present

## 2022-05-09 DIAGNOSIS — I200 Unstable angina: Secondary | ICD-10-CM

## 2022-05-09 DIAGNOSIS — R450 Nervousness: Secondary | ICD-10-CM | POA: Diagnosis not present

## 2022-05-09 DIAGNOSIS — E669 Obesity, unspecified: Secondary | ICD-10-CM | POA: Diagnosis present

## 2022-05-09 DIAGNOSIS — N400 Enlarged prostate without lower urinary tract symptoms: Secondary | ICD-10-CM | POA: Diagnosis present

## 2022-05-09 DIAGNOSIS — Z6834 Body mass index (BMI) 34.0-34.9, adult: Secondary | ICD-10-CM

## 2022-05-09 DIAGNOSIS — F411 Generalized anxiety disorder: Secondary | ICD-10-CM | POA: Diagnosis present

## 2022-05-09 DIAGNOSIS — I10 Essential (primary) hypertension: Secondary | ICD-10-CM | POA: Diagnosis present

## 2022-05-09 DIAGNOSIS — Z8616 Personal history of COVID-19: Secondary | ICD-10-CM

## 2022-05-09 LAB — BASIC METABOLIC PANEL
Anion gap: 13 (ref 5–15)
BUN: 15 mg/dL (ref 6–20)
CO2: 19 mmol/L — ABNORMAL LOW (ref 22–32)
Calcium: 9.6 mg/dL (ref 8.9–10.3)
Chloride: 105 mmol/L (ref 98–111)
Creatinine, Ser: 0.94 mg/dL (ref 0.61–1.24)
GFR, Estimated: >60 mL/min (ref >60)
Glucose, Bld: 112 mg/dL — ABNORMAL HIGH (ref 70–99)
Potassium: 3.6 mmol/L (ref 3.5–5.1)
Sodium: 137 mmol/L (ref 135–145)

## 2022-05-09 LAB — CBC
HCT: 45.4 % (ref 39.0–52.0)
Hemoglobin: 16.2 g/dL (ref 13.0–17.0)
MCH: 28.6 pg (ref 26.0–34.0)
MCHC: 35.7 g/dL (ref 30.0–36.0)
MCV: 80.2 fL (ref 80.0–100.0)
Platelets: 259 10*3/uL (ref 150–400)
RBC: 5.66 MIL/uL (ref 4.22–5.81)
RDW: 12.4 % (ref 11.5–15.5)
WBC: 6.5 10*3/uL (ref 4.0–10.5)
nRBC: 0 % (ref 0.0–0.2)

## 2022-05-09 NOTE — ED Triage Notes (Signed)
Patient BIB [facility] from home for evaluation after syncopal episode two nights ago, patient states he was walking from the bathroom and the next thing he remembers is waking up laying on the floor. Patient also complains of of waking during the night diaphoretic multiple nights over the last few days. Patient also reports an episode of chest tightness yesterday, denies pain today. Patient is alert, oriented,and in no apparent distress at this time.

## 2022-05-10 ENCOUNTER — Other Ambulatory Visit: Payer: Self-pay

## 2022-05-10 ENCOUNTER — Encounter (HOSPITAL_COMMUNITY)
Admission: EM | Disposition: A | Discharge status: Home / Self Care | Payer: Self-pay | Source: Home / Self Care | Attending: Thoracic Surgery (Cardiothoracic Vascular Surgery)

## 2022-05-10 ENCOUNTER — Observation Stay (HOSPITAL_BASED_OUTPATIENT_CLINIC_OR_DEPARTMENT_OTHER): Payer: Medicaid Other

## 2022-05-10 ENCOUNTER — Observation Stay (HOSPITAL_COMMUNITY): Payer: Medicaid Other

## 2022-05-10 ENCOUNTER — Encounter (HOSPITAL_COMMUNITY): Payer: Self-pay | Admitting: Internal Medicine

## 2022-05-10 ENCOUNTER — Emergency Department (HOSPITAL_COMMUNITY): Payer: Medicaid Other

## 2022-05-10 DIAGNOSIS — N329 Bladder disorder, unspecified: Secondary | ICD-10-CM | POA: Diagnosis not present

## 2022-05-10 DIAGNOSIS — I358 Other nonrheumatic aortic valve disorders: Secondary | ICD-10-CM | POA: Diagnosis not present

## 2022-05-10 DIAGNOSIS — I10 Essential (primary) hypertension: Secondary | ICD-10-CM | POA: Diagnosis not present

## 2022-05-10 DIAGNOSIS — Z79899 Other long term (current) drug therapy: Secondary | ICD-10-CM | POA: Diagnosis not present

## 2022-05-10 DIAGNOSIS — I209 Angina pectoris, unspecified: Secondary | ICD-10-CM | POA: Diagnosis not present

## 2022-05-10 DIAGNOSIS — I200 Unstable angina: Secondary | ICD-10-CM | POA: Diagnosis not present

## 2022-05-10 DIAGNOSIS — I471 Supraventricular tachycardia, unspecified: Secondary | ICD-10-CM | POA: Diagnosis not present

## 2022-05-10 DIAGNOSIS — I4891 Unspecified atrial fibrillation: Secondary | ICD-10-CM | POA: Diagnosis not present

## 2022-05-10 DIAGNOSIS — R29810 Facial weakness: Secondary | ICD-10-CM | POA: Diagnosis not present

## 2022-05-10 DIAGNOSIS — Z01818 Encounter for other preprocedural examination: Secondary | ICD-10-CM | POA: Diagnosis not present

## 2022-05-10 DIAGNOSIS — Z8249 Family history of ischemic heart disease and other diseases of the circulatory system: Secondary | ICD-10-CM | POA: Diagnosis not present

## 2022-05-10 DIAGNOSIS — F419 Anxiety disorder, unspecified: Secondary | ICD-10-CM | POA: Diagnosis not present

## 2022-05-10 DIAGNOSIS — F909 Attention-deficit hyperactivity disorder, unspecified type: Secondary | ICD-10-CM | POA: Diagnosis not present

## 2022-05-10 DIAGNOSIS — R079 Chest pain, unspecified: Secondary | ICD-10-CM | POA: Diagnosis not present

## 2022-05-10 DIAGNOSIS — I25110 Atherosclerotic heart disease of native coronary artery with unstable angina pectoris: Principal | ICD-10-CM

## 2022-05-10 DIAGNOSIS — I25119 Atherosclerotic heart disease of native coronary artery with unspecified angina pectoris: Secondary | ICD-10-CM | POA: Diagnosis not present

## 2022-05-10 DIAGNOSIS — J9811 Atelectasis: Secondary | ICD-10-CM | POA: Diagnosis not present

## 2022-05-10 DIAGNOSIS — E669 Obesity, unspecified: Secondary | ICD-10-CM | POA: Diagnosis not present

## 2022-05-10 DIAGNOSIS — E785 Hyperlipidemia, unspecified: Secondary | ICD-10-CM

## 2022-05-10 DIAGNOSIS — Z833 Family history of diabetes mellitus: Secondary | ICD-10-CM | POA: Diagnosis not present

## 2022-05-10 DIAGNOSIS — R55 Syncope and collapse: Secondary | ICD-10-CM

## 2022-05-10 DIAGNOSIS — G459 Transient cerebral ischemic attack, unspecified: Secondary | ICD-10-CM

## 2022-05-10 DIAGNOSIS — R0789 Other chest pain: Secondary | ICD-10-CM | POA: Diagnosis not present

## 2022-05-10 DIAGNOSIS — F319 Bipolar disorder, unspecified: Secondary | ICD-10-CM | POA: Diagnosis not present

## 2022-05-10 DIAGNOSIS — F32A Depression, unspecified: Secondary | ICD-10-CM | POA: Diagnosis not present

## 2022-05-10 DIAGNOSIS — D62 Acute posthemorrhagic anemia: Secondary | ICD-10-CM | POA: Diagnosis not present

## 2022-05-10 DIAGNOSIS — I4892 Unspecified atrial flutter: Secondary | ICD-10-CM | POA: Diagnosis not present

## 2022-05-10 DIAGNOSIS — E7800 Pure hypercholesterolemia, unspecified: Secondary | ICD-10-CM | POA: Diagnosis not present

## 2022-05-10 DIAGNOSIS — Z8616 Personal history of COVID-19: Secondary | ICD-10-CM | POA: Diagnosis not present

## 2022-05-10 DIAGNOSIS — Z7982 Long term (current) use of aspirin: Secondary | ICD-10-CM | POA: Diagnosis not present

## 2022-05-10 DIAGNOSIS — Z882 Allergy status to sulfonamides status: Secondary | ICD-10-CM | POA: Diagnosis not present

## 2022-05-10 DIAGNOSIS — Z01810 Encounter for preprocedural cardiovascular examination: Secondary | ICD-10-CM | POA: Diagnosis not present

## 2022-05-10 DIAGNOSIS — N400 Enlarged prostate without lower urinary tract symptoms: Secondary | ICD-10-CM | POA: Diagnosis not present

## 2022-05-10 DIAGNOSIS — Z951 Presence of aortocoronary bypass graft: Secondary | ICD-10-CM | POA: Diagnosis not present

## 2022-05-10 DIAGNOSIS — F411 Generalized anxiety disorder: Secondary | ICD-10-CM | POA: Diagnosis not present

## 2022-05-10 DIAGNOSIS — I2510 Atherosclerotic heart disease of native coronary artery without angina pectoris: Secondary | ICD-10-CM | POA: Diagnosis not present

## 2022-05-10 DIAGNOSIS — J90 Pleural effusion, not elsewhere classified: Secondary | ICD-10-CM | POA: Diagnosis not present

## 2022-05-10 DIAGNOSIS — I088 Other rheumatic multiple valve diseases: Secondary | ICD-10-CM | POA: Diagnosis not present

## 2022-05-10 DIAGNOSIS — I2582 Chronic total occlusion of coronary artery: Secondary | ICD-10-CM | POA: Diagnosis not present

## 2022-05-10 DIAGNOSIS — I472 Ventricular tachycardia, unspecified: Secondary | ICD-10-CM | POA: Diagnosis not present

## 2022-05-10 HISTORY — DX: Chest pain, unspecified: R07.9

## 2022-05-10 HISTORY — PX: LEFT HEART CATH AND CORONARY ANGIOGRAPHY: CATH118249

## 2022-05-10 LAB — LIPID PANEL
Cholesterol: 182 mg/dL (ref 0–200)
HDL: 38 mg/dL — ABNORMAL LOW (ref >40)
LDL Cholesterol: 108 mg/dL — ABNORMAL HIGH (ref 0–99)
Total CHOL/HDL Ratio: 4.8 RATIO
Triglycerides: 180 mg/dL — ABNORMAL HIGH (ref <150)
VLDL: 36 mg/dL (ref 0–40)

## 2022-05-10 LAB — TROPONIN I (HIGH SENSITIVITY)
Troponin I (High Sensitivity): 17 ng/L (ref <18)
Troponin I (High Sensitivity): 19 ng/L — ABNORMAL HIGH (ref <18)
Troponin I (High Sensitivity): 17 ng/L (ref <18)

## 2022-05-10 LAB — HEMOGLOBIN A1C
Hgb A1c MFr Bld: 5.4 % (ref 4.8–5.6)
Mean Plasma Glucose: 108.28 mg/dL

## 2022-05-10 LAB — ECHOCARDIOGRAM COMPLETE
Area-P 1/2: 3.33 cm2
Calc EF: 52.3 %
S' Lateral: 4.3 cm
Single Plane A2C EF: 56.8 %
Single Plane A4C EF: 48.2 %

## 2022-05-10 LAB — APTT: aPTT: 29 seconds (ref 24–36)

## 2022-05-10 LAB — ETHANOL: Alcohol, Ethyl (B): <10 mg/dL (ref <10)

## 2022-05-10 LAB — HIV ANTIBODY (ROUTINE TESTING W REFLEX): HIV Screen 4th Generation wRfx: NONREACTIVE

## 2022-05-10 LAB — PROTIME-INR
INR: 1 (ref 0.8–1.2)
Prothrombin Time: 13.5 seconds (ref 11.4–15.2)

## 2022-05-10 SURGERY — LEFT HEART CATH AND CORONARY ANGIOGRAPHY
Anesthesia: LOCAL

## 2022-05-10 MED ORDER — TRAZODONE HCL 100 MG PO TABS
100.0000 mg | ORAL_TABLET | Freq: Every evening | ORAL | Status: DC | PRN
  Administered 2022-05-10 – 2022-05-14 (×3): 100 mg via ORAL
  Filled 2022-05-10: qty 1
  Filled 2022-05-10: qty 2
  Filled 2022-05-10 (×2): qty 1

## 2022-05-10 MED ORDER — ONDANSETRON HCL 4 MG/2ML IJ SOLN: 4.0000 mg | Freq: Four times a day (QID) | INTRAMUSCULAR | Status: DC | PRN

## 2022-05-10 MED ORDER — LABETALOL HCL 5 MG/ML IV SOLN: 10.0000 mg | INTRAVENOUS | Status: AC | PRN

## 2022-05-10 MED ORDER — LIDOCAINE HCL (PF) 1 % IJ SOLN
INTRAMUSCULAR | Status: DC | PRN
  Administered 2022-05-10: 2 mL

## 2022-05-10 MED ORDER — SODIUM CHLORIDE 0.9 % IV SOLN: 250.0000 mL | INTRAVENOUS | Status: DC | PRN

## 2022-05-10 MED ORDER — SODIUM CHLORIDE 0.9% FLUSH
3.0000 mL | Freq: Two times a day (BID) | INTRAVENOUS | Status: DC
  Administered 2022-05-10: 3 mL via INTRAVENOUS

## 2022-05-10 MED ORDER — ACETAMINOPHEN 325 MG PO TABS: 650.0000 mg | ORAL_TABLET | ORAL | Status: DC | PRN

## 2022-05-10 MED ORDER — ATORVASTATIN CALCIUM 40 MG PO TABS
40.0000 mg | ORAL_TABLET | Freq: Every day | ORAL | Status: DC
  Administered 2022-05-10 – 2022-05-15 (×5): 40 mg via ORAL
  Filled 2022-05-10 (×5): qty 1

## 2022-05-10 MED ORDER — HYDRALAZINE HCL 20 MG/ML IJ SOLN: 10.0000 mg | INTRAMUSCULAR | Status: AC | PRN

## 2022-05-10 MED ORDER — VERAPAMIL HCL 2.5 MG/ML IV SOLN
INTRAVENOUS | Status: DC | PRN
  Administered 2022-05-10: 10 mL via INTRA_ARTERIAL

## 2022-05-10 MED ORDER — AMLODIPINE BESYLATE 10 MG PO TABS
10.0000 mg | ORAL_TABLET | Freq: Every day | ORAL | Status: DC
  Administered 2022-05-10 – 2022-05-11 (×2): 10 mg via ORAL
  Filled 2022-05-10: qty 1
  Filled 2022-05-10: qty 2

## 2022-05-10 MED ORDER — IOHEXOL 350 MG/ML SOLN
INTRAVENOUS | Status: DC | PRN
  Administered 2022-05-10: 40 mL

## 2022-05-10 MED ORDER — SODIUM CHLORIDE 0.9% FLUSH: 3.0000 mL | Freq: Two times a day (BID) | INTRAVENOUS | Status: DC

## 2022-05-10 MED ORDER — LIDOCAINE HCL (PF) 1 % IJ SOLN
INTRAMUSCULAR | Status: AC
  Filled 2022-05-10: qty 30

## 2022-05-10 MED ORDER — METOPROLOL SUCCINATE ER 25 MG PO TB24
25.0000 mg | ORAL_TABLET | Freq: Every day | ORAL | Status: DC
  Administered 2022-05-10 – 2022-05-11 (×2): 25 mg via ORAL
  Filled 2022-05-10 (×2): qty 1

## 2022-05-10 MED ORDER — HEPARIN SODIUM (PORCINE) 1000 UNIT/ML IJ SOLN
INTRAMUSCULAR | Status: DC | PRN
  Administered 2022-05-10: 4500 [IU] via INTRAVENOUS

## 2022-05-10 MED ORDER — ASPIRIN 81 MG PO CHEW
81.0000 mg | CHEWABLE_TABLET | Freq: Every day | ORAL | Status: DC
  Administered 2022-05-10 – 2022-05-11 (×2): 81 mg via ORAL
  Filled 2022-05-10 (×2): qty 1

## 2022-05-10 MED ORDER — TAMSULOSIN HCL 0.4 MG PO CAPS
0.4000 mg | ORAL_CAPSULE | Freq: Every day | ORAL | Status: DC
  Administered 2022-05-10 – 2022-05-19 (×9): 0.4 mg via ORAL
  Filled 2022-05-10 (×9): qty 1

## 2022-05-10 MED ORDER — SIMVASTATIN 20 MG PO TABS: 20.0000 mg | ORAL_TABLET | Freq: Every day | ORAL | Status: DC

## 2022-05-10 MED ORDER — ASPIRIN 81 MG PO CHEW: 81.0000 mg | CHEWABLE_TABLET | ORAL | Status: DC

## 2022-05-10 MED ORDER — MIDAZOLAM HCL 2 MG/2ML IJ SOLN
INTRAMUSCULAR | Status: DC | PRN
  Administered 2022-05-10: 2 mg via INTRAVENOUS

## 2022-05-10 MED ORDER — SERTRALINE HCL 100 MG PO TABS
200.0000 mg | ORAL_TABLET | Freq: Every day | ORAL | Status: DC
  Administered 2022-05-10 – 2022-05-19 (×9): 200 mg via ORAL
  Filled 2022-05-10 (×9): qty 2

## 2022-05-10 MED ORDER — ISOSORBIDE MONONITRATE ER 30 MG PO TB24
30.0000 mg | ORAL_TABLET | Freq: Every day | ORAL | Status: DC
  Administered 2022-05-10 – 2022-05-11 (×2): 30 mg via ORAL
  Filled 2022-05-10 (×2): qty 1

## 2022-05-10 MED ORDER — SODIUM CHLORIDE 0.9 % WEIGHT BASED INFUSION: 3.0000 mL/kg/h | INTRAVENOUS | Status: DC

## 2022-05-10 MED ORDER — ASPIRIN 81 MG PO CHEW
324.0000 mg | CHEWABLE_TABLET | Freq: Once | ORAL | Status: AC
  Administered 2022-05-10: 324 mg via ORAL
  Filled 2022-05-10: qty 4

## 2022-05-10 MED ORDER — MIDAZOLAM HCL 2 MG/2ML IJ SOLN
INTRAMUSCULAR | Status: AC
  Filled 2022-05-10: qty 2

## 2022-05-10 MED ORDER — FENTANYL CITRATE (PF) 100 MCG/2ML IJ SOLN
INTRAMUSCULAR | Status: AC
  Filled 2022-05-10: qty 2

## 2022-05-10 MED ORDER — SODIUM CHLORIDE 0.9% FLUSH: 3.0000 mL | INTRAVENOUS | Status: DC | PRN

## 2022-05-10 MED ORDER — FENTANYL CITRATE (PF) 100 MCG/2ML IJ SOLN
INTRAMUSCULAR | Status: DC | PRN
  Administered 2022-05-10: 50 ug via INTRAVENOUS

## 2022-05-10 MED ORDER — HEPARIN SODIUM (PORCINE) 1000 UNIT/ML IJ SOLN
INTRAMUSCULAR | Status: AC
  Filled 2022-05-10: qty 10

## 2022-05-10 MED ORDER — OMEGA-3-ACID ETHYL ESTERS 1 G PO CAPS
2.0000 g | ORAL_CAPSULE | Freq: Every day | ORAL | Status: DC
  Administered 2022-05-10 – 2022-05-11 (×2): 2 g via ORAL
  Filled 2022-05-10 (×2): qty 2

## 2022-05-10 MED ORDER — SODIUM CHLORIDE 0.9 % IV SOLN: INTRAVENOUS | Status: AC

## 2022-05-10 MED ORDER — ENOXAPARIN SODIUM 40 MG/0.4ML IJ SOSY: 40.0000 mg | PREFILLED_SYRINGE | INTRAMUSCULAR | Status: DC

## 2022-05-10 MED ORDER — NITROGLYCERIN 2 % TD OINT
0.5000 [in_us] | TOPICAL_OINTMENT | Freq: Once | TRANSDERMAL | Status: AC
  Administered 2022-05-10: 0.5 [in_us] via TOPICAL
  Filled 2022-05-10: qty 1

## 2022-05-10 MED ORDER — ADULT MULTIVITAMIN W/MINERALS CH
1.0000 | ORAL_TABLET | Freq: Every day | ORAL | Status: DC
  Administered 2022-05-10 – 2022-05-11 (×2): 1 via ORAL
  Filled 2022-05-10 (×2): qty 1

## 2022-05-10 MED ORDER — VERAPAMIL HCL 2.5 MG/ML IV SOLN
INTRAVENOUS | Status: AC
  Filled 2022-05-10: qty 2

## 2022-05-10 MED ORDER — HEPARIN (PORCINE) 25000 UT/250ML-% IV SOLN
1350.0000 [IU]/h | INTRAVENOUS | Status: DC
  Administered 2022-05-10: 1200 [IU]/h via INTRAVENOUS
  Filled 2022-05-10 (×2): qty 250

## 2022-05-10 MED ORDER — HEPARIN (PORCINE) IN NACL 1000-0.9 UT/500ML-% IV SOLN
INTRAVENOUS | Status: DC | PRN
  Administered 2022-05-10 (×2): 500 mL

## 2022-05-10 MED ORDER — SODIUM CHLORIDE 0.9 % WEIGHT BASED INFUSION: 1.0000 mL/kg/h | INTRAVENOUS | Status: DC

## 2022-05-10 MED ORDER — ALPRAZOLAM 0.5 MG PO TABS: 1.0000 mg | ORAL_TABLET | Freq: Every day | ORAL | Status: DC | PRN

## 2022-05-10 SURGICAL SUPPLY — 11 items
CATH INFINITI 5 FR JL3.5 (CATHETERS) IMPLANT
CATH INFINITI JR4 5F (CATHETERS) IMPLANT
DEVICE RAD COMP TR BAND LRG (VASCULAR PRODUCTS) IMPLANT
ELECT DEFIB PAD ADLT CADENCE (PAD) IMPLANT
GLIDESHEATH SLEND SS 6F .021 (SHEATH) IMPLANT
GUIDEWIRE INQWIRE 1.5J.035X260 (WIRE) IMPLANT
INQWIRE 1.5J .035X260CM (WIRE) ×1
KIT HEART LEFT (KITS) ×1 IMPLANT
PACK CARDIAC CATHETERIZATION (CUSTOM PROCEDURE TRAY) ×1 IMPLANT
TRANSDUCER W/STOPCOCK (MISCELLANEOUS) ×1 IMPLANT
TUBING CIL FLEX 10 FLL-RA (TUBING) ×1 IMPLANT

## 2022-05-10 NOTE — ED Notes (Signed)
ED TO INPATIENT HANDOFF REPORT  ED Nurse Name and Phone #: Altha Harm [phone]  S Name/Age/Gender Glenn Stewart 57 y.o. male Room/Bed: 045C/045C  Code Status   Code Status: Full Code  Home/SNF/Other Home Patient oriented to: self, place, time, and situation Is this baseline? Yes   Triage Complete: Triage complete  Chief Complaint Chest pain, rule out acute myocardial infarction [R07.9]  Triage Note Patient BIB [facility] from home for evaluation after syncopal episode two nights ago, patient states he was walking from the bathroom and the next thing he remembers is waking up laying on the floor. Patient also complains of of waking during the night diaphoretic multiple nights over the last few days. Patient also reports an episode of chest tightness yesterday, denies pain today. Patient is alert, oriented,and in no apparent distress at this time.   Allergies Allergies  Allergen Reactions   Sulfonamide Derivatives     REACTION: hives    Level of Care/Admitting Diagnosis ED Disposition     ED Disposition  Admit   Condition  --   Comment  Hospital Area: [facility] [[id number]]  Level of Care: Telemetry Cardiac [[id number]]  May place patient in observation at [facility] or [facility] if equivalent level of care is available:: No  Covid Evaluation: Asymptomatic - no recent exposure (last 10 days) testing not required  Diagnosis: Chest pain, rule out acute myocardial infarction [id number]  Admitting Physician: Etta Quill [id number]  Attending Physician: Etta Quill [[id number]]          B Medical/Surgery History Past Medical History:  Diagnosis Date   ADHD (attention deficit hyperactivity disorder)    Anxiety state, unspecified    Bipolar 1 disorder ([place])    CHEST PAIN 12/04/2008   Qualifier: Diagnosis of  By: Glori Bickers MD, Carmell Austria    Depressive disorder, not elsewhere classified    Dermatophytosis of nail    Esophageal reflux    HYPERGLYCEMIA 07/10/2007    Qualifier: Diagnosis of  By: Glori Bickers MD, Carmell Austria    Kidney stones 09/22/2010   Low back pain 09/18/2013   Other chronic nonalcoholic liver disease    Problems with hearing    Prostatitis, unspecified    Pure hypercholesterolemia    Screen for STD (sexually transmitted disease) 04/20/2011   Sprain of cruciate ligament of knee    Tear of medial cartilage or meniscus of knee, current    TMJ (temporomandibular joint disorder) 12/25/2010   Unspecified essential hypertension    Unspecified gastritis and gastroduodenitis without mention of hemorrhage    Urethritis, unspecified    Past Surgical History:  Procedure Laterality Date   No prior surgery       A IV Location/Drains/Wounds Patient Lines/Drains/Airways Status     Active Line/Drains/Airways     Name Placement date Placement time Site Days   Peripheral IV 05/10/22 20 G Posterior;Right Forearm 05/10/22  0354  Forearm  less than 1            Intake/Output Last 24 hours No intake or output data in the 24 hours ending 05/10/22 1230  Labs/Imaging Results for orders placed or performed during the hospital encounter of 05/09/22 (from the past 48 hour(s))  Basic metabolic panel     Status: Abnormal   Collection Time: 05/09/22  6:02 PM  Result Value Ref Range   Sodium 137 135 - 145 mmol/L   Potassium 3.6 3.5 - 5.1 mmol/L   Chloride 105 98 - 111 mmol/L  CO2 19 (L) 22 - 32 mmol/L   Glucose, Bld 112 (H) 70 - 99 mg/dL    Comment: Glucose reference range applies only to samples taken after fasting for at least 8 hours.   BUN 15 6 - 20 mg/dL   Creatinine, Ser 0.94 0.61 - 1.24 mg/dL   Calcium 9.6 8.9 - 10.3 mg/dL   GFR, Estimated >60 >60 mL/min    Comment: (NOTE) Calculated using the CKD-EPI Creatinine Equation (2021)    Anion gap 13 5 - 15    Comment: Performed at [facility] [address], [place] [address]  CBC     Status: None   Collection Time: 05/09/22  6:02 PM  Result Value Ref Range   WBC 6.5 4.0 -  10.5 K/uL   RBC 5.66 4.22 - 5.81 MIL/uL   Hemoglobin 16.2 13.0 - 17.0 g/dL   HCT 45.4 39.0 - 52.0 %   MCV 80.2 80.0 - 100.0 fL   MCH 28.6 26.0 - 34.0 pg   MCHC 35.7 30.0 - 36.0 g/dL   RDW 12.4 11.5 - 15.5 %   Platelets 259 150 - 400 K/uL   nRBC 0.0 0.0 - 0.2 %    Comment: Performed at [facility], [place] [address], [place], [place] [address]  Troponin I (High Sensitivity)     Status: None   Collection Time: 05/10/22  1:22 AM  Result Value Ref Range   Troponin I (High Sensitivity) 17 <18 ng/L    Comment: (NOTE) Elevated high sensitivity troponin I (hsTnI) values and significant  changes across serial measurements may suggest ACS but many other  chronic and acute conditions are known to elevate hsTnI results.  Refer to the "Links" section for chest pain algorithms and additional  guidance. Performed at [facility], [place] [address], [place], [place] [address]   Troponin I (High Sensitivity)     Status: Abnormal   Collection Time: 05/10/22  3:50 AM  Result Value Ref Range   Troponin I (High Sensitivity) 19 (H) <18 ng/L    Comment: (NOTE) Elevated high sensitivity troponin I (hsTnI) values and significant  changes across serial measurements may suggest ACS but many other  chronic and acute conditions are known to elevate hsTnI results.  Refer to the "Links" section for chest pain algorithms and additional  guidance. Performed at [facility], [place] [address], [place], [place] [address]   APTT     Status: None   Collection Time: 05/10/22  3:50 AM  Result Value Ref Range   aPTT 29 24 - 36 seconds    Comment: Performed at [facility] [address] [place], [place] [address]  Protime-INR     Status: None   Collection Time: 05/10/22  3:50 AM  Result Value Ref Range   Prothrombin Time 13.5 11.4 - 15.2 seconds   INR 1.0 0.8 - 1.2    Comment: (NOTE) INR goal varies based on device and disease states. Performed at [facility], [place] [address]  [place],  [address]   Ethanol     Status: None   Collection Time: 05/10/22  3:50 AM  Result Value Ref Range   Alcohol, Ethyl (B) <10 <10 mg/dL    Comment: (NOTE) Lowest detectable limit for serum alcohol is 10 mg/dL.  For medical purposes only. Performed at [facility], [place] [address], [place], [place] [address]   Troponin I (High Sensitivity)  Status: None   Collection Time: 05/10/22  5:05 AM  Result Value Ref Range   Troponin I (High Sensitivity) 17 <18 ng/L    Comment: (NOTE) Elevated high sensitivity troponin I (hsTnI) values and significant  changes across serial measurements may suggest ACS but many other  chronic and acute conditions are known to elevate hsTnI results.  Refer to the "Links" section for chest pain algorithms and additional  guidance. Performed at [facility], [place] [address] [place], [place] [address]   HIV Antibody (routine testing w rflx)     Status: None   Collection Time: 05/10/22  5:05 AM  Result Value Ref Range   HIV Screen 4th Generation wRfx Non Reactive Non Reactive    Comment: Performed at [facility], [place] [address] [place], [place] [address]  Lipid panel     Status: Abnormal   Collection Time: 05/10/22  5:05 AM  Result Value Ref Range   Cholesterol 182 0 - 200 mg/dL   Triglycerides 180 (H) <150 mg/dL   HDL 38 (L) >40 mg/dL   Total CHOL/HDL Ratio 4.8 RATIO   VLDL 36 0 - 40 mg/dL   LDL Cholesterol 108 (H) 0 - 99 mg/dL    Comment:        Total Cholesterol/HDL:CHD Risk Coronary Heart Disease Risk Table                     Men   Women  1/2 Average Risk   3.4   3.3  Average Risk       5.0   4.4  2 X Average Risk   9.6   7.1  3 X Average Risk  23.4   11.0        Use the calculated Patient Ratio above and the CHD Risk Table to determine the patient's CHD Risk.        ATP III CLASSIFICATION (LDL):  <100     mg/dL   Optimal  100-129  mg/dL   Near or Above                    Optimal  130-159  mg/dL    Borderline  160-189  mg/dL   High  >190     mg/dL   Very High Performed at [facility] [address] [place], [place] [address]    ECHOCARDIOGRAM COMPLETE  Result Date: 05/10/2022    ECHOCARDIOGRAM REPORT   Patient Name:   Glenn Stewart Date of Exam: 05/10/2022 Medical Rec #:  [id number]        Height:       66.0 in Accession #:    [id number]       Weight:       216.6 lb Date of Birth:  1965/10/28         BSA:          2.069 m Patient Age:    49 years         BP:           148/100 mmHg Patient Gender: M                HR:           61 bpm. Exam Location:  Inpatient Procedure: 2D Echo, Color Doppler and Cardiac Doppler Indications:    TIA  History:        Patient has no prior history of Echocardiogram examinations.  CAD; Risk Factors:Hypertension and Dyslipidemia.  Sonographer:    Raquel Sarna Senior RDCS Referring Phys: [place]  1. Inferior basal hypokinesis . Left ventricular ejection fraction, by estimation, is 50 to 55%. The left ventricle has low normal function. The left ventricle demonstrates regional wall motion abnormalities (see scoring diagram/findings for description). Left ventricular diastolic parameters were normal.  2. Right ventricular systolic function is normal. The right ventricular size is normal.  3. Left atrial size was mildly dilated.  4. The mitral valve is abnormal. Trivial mitral valve regurgitation. No evidence of mitral stenosis.  5. The aortic valve is tricuspid. There is mild calcification of the aortic valve. Aortic valve regurgitation is not visualized. Aortic valve sclerosis is present, with no evidence of aortic valve stenosis.  6. The inferior vena cava is normal in size with greater than 50% respiratory variability, suggesting right atrial pressure of 3 mmHg. FINDINGS  Left Ventricle: Inferior basal hypokinesis. Left ventricular ejection fraction, by estimation, is 50 to 55%. The left ventricle has low normal function. The left  ventricle demonstrates regional wall motion abnormalities. The left ventricular internal cavity size was normal in size. There is no left ventricular hypertrophy. Left ventricular diastolic parameters were normal. Right Ventricle: The right ventricular size is normal. No increase in right ventricular wall thickness. Right ventricular systolic function is normal. Left Atrium: Left atrial size was mildly dilated. Right Atrium: Right atrial size was normal in size. Pericardium: There is no evidence of pericardial effusion. Mitral Valve: The mitral valve is abnormal. There is mild thickening of the mitral valve leaflet(s). Trivial mitral valve regurgitation. No evidence of mitral valve stenosis. Tricuspid Valve: The tricuspid valve is normal in structure. Tricuspid valve regurgitation is not demonstrated. No evidence of tricuspid stenosis. Aortic Valve: The aortic valve is tricuspid. There is mild calcification of the aortic valve. Aortic valve regurgitation is not visualized. Aortic valve sclerosis is present, with no evidence of aortic valve stenosis. Pulmonic Valve: The pulmonic valve was normal in structure. Pulmonic valve regurgitation is not visualized. No evidence of pulmonic stenosis. Aorta: The aortic root is normal in size and structure. Venous: The inferior vena cava is normal in size with greater than 50% respiratory variability, suggesting right atrial pressure of 3 mmHg. IAS/Shunts: No atrial level shunt detected by color flow Doppler.  LEFT VENTRICLE PLAX 2D LVIDd:         5.70 cm      Diastology LVIDs:         4.30 cm      LV e' medial:    7.72 cm/s LV PW:         0.80 cm      LV E/e' medial:  9.2 LV IVS:        0.90 cm      LV e' lateral:   13.20 cm/s LVOT diam:     2.30 cm      LV E/e' lateral: 5.4 LV SV:         80 LV SV Index:   39 LVOT Area:     4.15 cm  LV Volumes (MOD) LV vol d, MOD A2C: 120.0 ml LV vol d, MOD A4C: 116.0 ml LV vol s, MOD A2C: 51.8 ml LV vol s, MOD A4C: 60.1 ml LV SV MOD A2C:      68.2 ml LV SV MOD A4C:     116.0 ml LV SV MOD BP:      61.7 ml RIGHT VENTRICLE RV S prime:  14.60 cm/s TAPSE (M-mode): 1.7 cm LEFT ATRIUM             Index        RIGHT ATRIUM           Index LA diam:        3.90 cm 1.89 cm/m   RA Area:     15.80 cm LA Vol (A2C):   51.7 ml 24.99 ml/m  RA Volume:   33.40 ml  16.15 ml/m LA Vol (A4C):   42.7 ml 20.64 ml/m LA Biplane Vol: 48.2 ml 23.30 ml/m  AORTIC VALVE LVOT Vmax:   90.20 cm/s LVOT Vmean:  67.800 cm/s LVOT VTI:    0.193 m  AORTA Ao Root diam: 3.50 cm Ao Asc diam:  3.50 cm MITRAL VALVE MV Area (PHT): 3.33 cm    SHUNTS MV Decel Time: 228 msec    Systemic VTI:  0.19 m MV E velocity: 71.30 cm/s  Systemic Diam: 2.30 cm MV A velocity: 67.10 cm/s MV E/A ratio:  1.06 Jenkins Rouge MD Electronically signed by Jenkins Rouge MD Signature Date/Time: 05/10/2022/11:34:55 AM    Final    VAS US CAROTID  Result Date: 05/10/2022 Carotid Arterial Duplex Study Patient Name:  Glenn Stewart  Date of Exam:   05/10/2022 Medical Rec #: [id number]         Accession #:    [id number] Date of Birth: Mar 12, 1966          Patient Gender: M Patient Age:   57 years Exam Location:  [facility] Procedure:      VAS US CAROTID Referring Phys: Oren Binet --------------------------------------------------------------------------------  Indications:       Multiple syncopal episodes. Risk Factors:      Hypertension, hyperlipidemia. Other Factors:     Chest pain, NASH, right facial droop that quickly resolved. Comparison Study:  No prior study on file Performing Technologist: Sharion Dove RVS  Examination Guidelines: A complete evaluation includes B-mode imaging, spectral Doppler, color Doppler, and power Doppler as needed of all accessible portions of each vessel. Bilateral testing is considered an integral part of a complete examination. Limited examinations for reoccurring indications may be performed as noted.  Right Carotid Findings:  +----------+--------+--------+--------+------------------+------------------+           PSV cm/sEDV cm/sStenosisPlaque DescriptionComments           +----------+--------+--------+--------+------------------+------------------+ CCA Prox  90      16                                intimal thickening +----------+--------+--------+--------+------------------+------------------+ CCA Distal80      17                                intimal thickening +----------+--------+--------+--------+------------------+------------------+ ICA Prox  83      25      1-39%   calcific          Shadowing          +----------+--------+--------+--------+------------------+------------------+ ICA Mid   64      22                                                   +----------+--------+--------+--------+------------------+------------------+ ICA Distal69  28                                                   +----------+--------+--------+--------+------------------+------------------+ ECA       91      10                                                   +----------+--------+--------+--------+------------------+------------------+ +----------+--------+-------+--------+-------------------+           PSV cm/sEDV cmsDescribeArm Pressure (mmHG) +----------+--------+-------+--------+-------------------+ Subclavian102                                        +----------+--------+-------+--------+-------------------+ +---------+--------+--+--------+-+ VertebralPSV cm/s15EDV cm/s3 +---------+--------+--+--------+-+  Left Carotid Findings: +----------+--------+--------+--------+------------------+------------------+           PSV cm/sEDV cm/sStenosisPlaque DescriptionComments           +----------+--------+--------+--------+------------------+------------------+ CCA Prox  119     23                                intimal thickening  +----------+--------+--------+--------+------------------+------------------+ CCA Distal100     20                                intimal thickening +----------+--------+--------+--------+------------------+------------------+ ICA Prox  59      20      1-39%   heterogenous      Shadowing          +----------+--------+--------+--------+------------------+------------------+ ICA Mid   65      26                                                   +----------+--------+--------+--------+------------------+------------------+ ICA Distal65      27                                                   +----------+--------+--------+--------+------------------+------------------+ ECA       110     17                                                   +----------+--------+--------+--------+------------------+------------------+ +----------+--------+--------+--------+-------------------+           PSV cm/sEDV cm/sDescribeArm Pressure (mmHG) +----------+--------+--------+--------+-------------------+ Subclavian131                                         +----------+--------+--------+--------+-------------------+ +---------+--------+--+--------+--+ VertebralPSV cm/s65EDV cm/s18 +---------+--------+--+--------+--+   Summary: Right Carotid: Velocities in the right ICA are consistent with a 1-39% stenosis. Left Carotid: Velocities in the  left ICA are consistent with a 1-39% stenosis. Vertebrals:  Bilateral vertebral arteries demonstrate antegrade flow. Subclavians: Normal flow hemodynamics were seen in bilateral subclavian              arteries. *See table(s) above for measurements and observations.  Electronically signed by Servando Snare MD on 05/10/2022 at 10:46:18 AM.    Final    MR BRAIN WO CONTRAST  Result Date: 05/10/2022 CLINICAL DATA:  TIA.  Syncope after night sweats. EXAM: MRI HEAD WITHOUT CONTRAST TECHNIQUE: Multiplanar, multiecho pulse sequences of the brain and  surrounding structures were obtained without intravenous contrast. COMPARISON:  Head CT from earlier today FINDINGS: Brain: No acute infarction, hemorrhage, hydrocephalus, extra-axial collection or mass lesion. Few small FLAIR hyperintensities scattered in the cerebral white matter, usually related to chronic small vessel ischemia especially given patient's small-vessel risk factors. Vascular: Major flow voids are preserved Skull and upper cervical spine: Normal marrow signal Sinuses/Orbits: Negative IMPRESSION: 1. No acute or reversible finding. 2. Few remote white matter insults, usually chronic microvascular ischemia. Electronically Signed   By: Jorje Guild M.D.   On: 05/10/2022 07:58   CT HEAD WO CONTRAST  Result Date: 05/10/2022 CLINICAL DATA:  Recent syncopal episode, initial encounter EXAM: CT HEAD WITHOUT CONTRAST TECHNIQUE: Contiguous axial images were obtained from the base of the skull through the vertex without intravenous contrast. RADIATION DOSE REDUCTION: This exam was performed according to the departmental dose-optimization program which includes automated exposure control, adjustment of the mA and/or kV according to patient size and/or use of iterative reconstruction technique. COMPARISON:  None Available. FINDINGS: Brain: No evidence of acute infarction, hemorrhage, hydrocephalus, extra-axial collection or mass lesion/mass effect. Vascular: No hyperdense vessel or unexpected calcification. Skull: Normal. Negative for fracture or focal lesion. Sinuses/Orbits: No acute finding. Other: None. IMPRESSION: No acute intracranial abnormality noted. Electronically Signed   By: Inez Catalina M.D.   On: 05/10/2022 01:24    Pending Labs Unresulted Labs (From admission, onward)     Start     Ordered   05/10/22 1007  Hemoglobin A1c  Once,   R        05/10/22 1006   05/10/22 0318  Rapid urine drug screen (hospital performed)  Once,   STAT        05/10/22 0318             Vitals/Pain Today's Vitals   05/10/22 0915 05/10/22 0945 05/10/22 1015 05/10/22 1111  BP: (!) 143/93 133/73 (!) 151/95   Pulse: 61 60 68   Resp: 18 18 12   $ Temp:      TempSrc:      SpO2: 93% 93% 94%   Weight:    198 lb (89.8 kg)  Height:    5' 6"$  (1.676 m)  PainSc:        Isolation Precautions No active isolations  Medications Medications  amLODipine (NORVASC) tablet 10 mg (10 mg Oral Given 05/10/22 1129)  ALPRAZolam (XANAX) tablet 1 mg (has no administration in time range)  aspirin chewable tablet 81 mg (81 mg Oral Given 05/10/22 1129)  isosorbide mononitrate (IMDUR) 24 hr tablet 30 mg (30 mg Oral Given 05/10/22 1128)  metoprolol succinate (TOPROL-XL) 24 hr tablet 25 mg (25 mg Oral Given 05/10/22 1129)  multivitamin with minerals tablet 1 tablet (1 tablet Oral Given 05/10/22 1129)  omega-3 acid ethyl esters (LOVAZA) capsule 2 g (2 g Oral Given 05/10/22 1130)  sertraline (ZOLOFT) tablet 200 mg (200 mg Oral Given 05/10/22 1128)  tamsulosin (  FLOMAX) capsule 0.4 mg (0.4 mg Oral Given 05/10/22 1128)  traZODone (DESYREL) tablet 100 mg (has no administration in time range)  acetaminophen (TYLENOL) tablet 650 mg (has no administration in time range)  ondansetron (ZOFRAN) injection 4 mg (has no administration in time range)  enoxaparin (LOVENOX) injection 40 mg (has no administration in time range)  atorvastatin (LIPITOR) tablet 40 mg (40 mg Oral Given 05/10/22 1129)  sodium chloride flush (NS) 0.9 % injection 3 mL (3 mLs Intravenous Given 05/10/22 1132)  0.9% sodium chloride infusion (3 mL/kg/hr  89.8 kg Intravenous Not Given 05/10/22 1217)    Followed by  0.9% sodium chloride infusion (has no administration in time range)  aspirin chewable tablet 324 mg (324 mg Oral Given 05/10/22 0311)  nitroGLYCERIN (NITROGLYN) 2 % ointment 0.5 inch (0.5 inches Topical Given 05/10/22 [id number])    Mobility walks     Focused Assessments Cardiac Assessment Handoff:    No results found for: "CKTOTAL",  "CKMB", "CKMBINDEX", "TROPONINI" Lab Results  Component Value Date   DDIMER <0.27 03/20/2020   Does the Patient currently have chest pain? No    R Recommendations: See Admitting Provider Note  Report given to:   Additional Notes: Pt has moments of confusion.  Pt will be going to the Cath Lab at some point today.

## 2022-05-10 NOTE — Progress Notes (Signed)
Echocardiogram 2D Echocardiogram has been performed.  Oneal Deputy Decarla Siemen RDCS 05/10/2022, 10:03 AM

## 2022-05-10 NOTE — Progress Notes (Signed)
PROGRESS NOTE        PATIENT DETAILS Name: Glenn Stewart Age: 57 y.o. Sex: male Date of Birth: 1965/10/26 Admit Date: 05/09/2022 Admitting Physician Etta Quill, DO [id number], Wynelle Fanny, MD  Brief Summary: Patient is a 57 y.o.  male with history of HTN, HLD, NASH-who presented with several days history of left-sided chest pain associated with diaphoresis and an episode of syncope.  On day of admission he also had transient left-sided facial numbness  Significant events: 2/18>> admit to [facility]  Significant studies: 2/19>> MRI brain: No CVA 2/19>> LDL: 108  Significant microbiology data: None  Procedures: None  Consults: Cardiology  Subjective: Lying comfortably in bed-denies any chest pain or shortness of breath.  Objective: Vitals: Blood pressure (!) 148/100, pulse 62, temperature 98 F (36.7 C), temperature source Oral, resp. rate 18, SpO2 96 %.   Exam: Gen Exam:Alert awake-not in any distress HEENT:atraumatic, normocephalic Chest: B/L clear to auscultation anteriorly CVS:S1S2 regular Abdomen:soft non tender, non distended Extremities:no edema Neurology: Non focal Skin: no rash  Pertinent Labs/Radiology:    Latest Ref Rng & Units 05/09/2022    6:02 PM 09/29/2020    7:48 PM 03/20/2020    5:22 PM  CBC  WBC 4.0 - 10.5 K/uL 6.5  10.7  6.8   Hemoglobin 13.0 - 17.0 g/dL 16.2  13.8  16.3   Hematocrit 39.0 - 52.0 % 45.4  41.9  48.9   Platelets 150 - 400 K/uL 259  279  251     Lab Results  Component Value Date   NA 137 05/09/2022   K 3.6 05/09/2022   CL 105 05/09/2022   CO2 19 (L) 05/09/2022      Assessment/Plan: Chest pain With both typical/atypical features Prior coronary calcium score in 2019 in the 99th percentile EKG/troponins negative Echo pending Await cards input Continue ASA/Statin/Beta blocker/nitrates. Statin changed to high intensity  Left Facial numbness ?TIA or related to angina/chest pain Resoved MRI brain  negative for CVA Change to high intensity statin Continue aspirin Check carotid Doppler Check A1c  HTN Stable Continue amlodipine/beta-blocker/nitrate  HLD Switched to Lipitor 40 mg daily  Mood disorder Continue Zoloft and as needed Xanax  BPH Flomax  Obesity: Estimated body mass index is 34.96 kg/m as calculated from the following:   Height as of 11/28/20: 5' 6"$  (1.676 m).   Weight as of 11/28/20: 98.2 kg.   Code status:   Code Status: Full Code   DVT Prophylaxis: enoxaparin (LOVENOX) injection 40 mg Start: 05/10/22 1400   Family Communication: None at bedside   Disposition Plan: Status is: Observation The patient remains OBS appropriate and will d/c before 2 midnights.   Planned Discharge Destination:Home   Diet: Diet Order             Diet NPO time specified Except for: Sips with Meds  Diet effective now                     Antimicrobial agents: Anti-infectives (From admission, onward)    None        MEDICATIONS: Scheduled Meds:  amLODipine  10 mg Oral Daily   aspirin  81 mg Oral Daily   atorvastatin  40 mg Oral Daily   enoxaparin (LOVENOX) injection  40 mg Subcutaneous Q24H   isosorbide mononitrate  30 mg Oral Daily  metoprolol succinate  25 mg Oral Daily   multivitamin with minerals  1 tablet Oral Daily   omega-3 acid ethyl esters  2 g Oral Daily   sertraline  200 mg Oral Daily   tamsulosin  0.4 mg Oral Daily   Continuous Infusions: PRN Meds:.acetaminophen, ALPRAZolam, ondansetron (ZOFRAN) IV, traZODone   I have personally reviewed following labs and imaging studies  LABORATORY DATA: CBC: Recent Labs  Lab 05/09/22 1802  WBC 6.5  HGB 16.2  HCT 45.4  MCV 80.2  PLT [id number]    Basic Metabolic Panel: Recent Labs  Lab 05/09/22 1802  NA 137  K 3.6  CL 105  CO2 19*  GLUCOSE 112*  BUN 15  CREATININE 0.94  CALCIUM 9.6    GFR: CrCl cannot be calculated (Unknown ideal weight.).  Liver Function Tests: No results for  input(s): "AST", "ALT", "ALKPHOS", "BILITOT", "PROT", "ALBUMIN" in the last 168 hours. No results for input(s): "LIPASE", "AMYLASE" in the last 168 hours. No results for input(s): "AMMONIA" in the last 168 hours.  Coagulation Profile: Recent Labs  Lab 05/10/22 0350  INR 1.0    Cardiac Enzymes: No results for input(s): "CKTOTAL", "CKMB", "CKMBINDEX", "TROPONINI" in the last 168 hours.  BNP (last 3 results) No results for input(s): "PROBNP" in the last 8760 hours.  Lipid Profile: Recent Labs    05/10/22 0505  CHOL 182  HDL 38*  LDLCALC 108*  TRIG 180*  CHOLHDL 4.8    Thyroid Function Tests: No results for input(s): "TSH", "T4TOTAL", "FREET4", "T3FREE", "THYROIDAB" in the last 72 hours.  Anemia Panel: No results for input(s): "VITAMINB12", "FOLATE", "FERRITIN", "TIBC", "IRON", "RETICCTPCT" in the last 72 hours.  Urine analysis:    Component Value Date/Time   COLORURINE YELLOW 03/20/2020 1950   APPEARANCEUR CLEAR 03/20/2020 1950   LABSPEC 1.016 03/20/2020 1950   PHURINE 5.0 03/20/2020 1950   GLUCOSEU NEGATIVE 03/20/2020 1950   HGBUR SMALL (A) 03/20/2020 1950   HGBUR negative 01/18/2009 0000   BILIRUBINUR Negative 04/03/2020 1109   KETONESUR NEGATIVE 03/20/2020 1950   PROTEINUR Positive (A) 04/03/2020 1109   PROTEINUR NEGATIVE 03/20/2020 1950   UROBILINOGEN 0.2 04/03/2020 1109   UROBILINOGEN 0.2 01/07/2014 0616   NITRITE Negative 04/03/2020 1109   NITRITE NEGATIVE 03/20/2020 1950   LEUKOCYTESUR Negative 04/03/2020 1109   LEUKOCYTESUR NEGATIVE 03/20/2020 1950    Sepsis Labs: Lactic Acid, Venous    Component Value Date/Time   LATICACIDVEN 0.9 09/29/2020 1945    MICROBIOLOGY: No results found for this or any previous visit (from the past 240 hour(s)).  RADIOLOGY STUDIES/RESULTS: MR BRAIN WO CONTRAST  Result Date: 05/10/2022 CLINICAL DATA:  TIA.  Syncope after night sweats. EXAM: MRI HEAD WITHOUT CONTRAST TECHNIQUE: Multiplanar, multiecho pulse sequences of  the brain and surrounding structures were obtained without intravenous contrast. COMPARISON:  Head CT from earlier today FINDINGS: Brain: No acute infarction, hemorrhage, hydrocephalus, extra-axial collection or mass lesion. Few small FLAIR hyperintensities scattered in the cerebral white matter, usually related to chronic small vessel ischemia especially given patient's small-vessel risk factors. Vascular: Major flow voids are preserved Skull and upper cervical spine: Normal marrow signal Sinuses/Orbits: Negative IMPRESSION: 1. No acute or reversible finding. 2. Few remote white matter insults, usually chronic microvascular ischemia. Electronically Signed   By: Jorje Guild M.D.   On: 05/10/2022 07:58   CT HEAD WO CONTRAST  Result Date: 05/10/2022 CLINICAL DATA:  Recent syncopal episode, initial encounter EXAM: CT HEAD WITHOUT CONTRAST TECHNIQUE: Contiguous axial images were obtained from the  base of the skull through the vertex without intravenous contrast. RADIATION DOSE REDUCTION: This exam was performed according to the departmental dose-optimization program which includes automated exposure control, adjustment of the mA and/or kV according to patient size and/or use of iterative reconstruction technique. COMPARISON:  None Available. FINDINGS: Brain: No evidence of acute infarction, hemorrhage, hydrocephalus, extra-axial collection or mass lesion/mass effect. Vascular: No hyperdense vessel or unexpected calcification. Skull: Normal. Negative for fracture or focal lesion. Sinuses/Orbits: No acute finding. Other: None. IMPRESSION: No acute intracranial abnormality noted. Electronically Signed   By: Inez Catalina M.D.   On: 05/10/2022 01:24     LOS: 0 days   Oren Binet, MD  [facility]    To contact the attending provider between 7A-7P or the covering provider during after hours 7P-7A, please log into the web site [email or web address] and access using universal [place] password for that web  site. If you do not have the password, please call the hospital operator.  05/10/2022, 10:00 AM

## 2022-05-10 NOTE — ED Notes (Signed)
Got report from [facility] all questions answered.

## 2022-05-10 NOTE — Consult Note (Cosign Needed Addendum)
[address]       [place] [address]             [phone]        Duvall T Northrup Scanlon Medical Record [id number] Date of Birth: Jul 28, 1965  Referring: No ref. provider found Primary Care: Tower, Wynelle Fanny, MD Primary Cardiologist:Brian Stanford Breed, MD  Chief Complaint:    Chief Complaint  Patient presents with   Loss of Consciousness    History of Present Illness:     Glenn Stewart is a 57 year old male with a past medical history of HTN, HLD, NASH, Anxiety and Depression. He presented to the ED on 05/10/22 after calling 911 due to left facial numbness and droop, which resolved by the time the paramedics arrived. He states that 2 nights prior he awoke from his sleep diaphoretic and the following night he again awoke diaphoretic and believes he had a syncopal episode after waking up on the bathroom floor. Over the past couple weeks he has been having intermittent dull chest tightness that resolves on its own which has worsened over the past 2 days. He states he was changing his sheets when he became fatigued and experienced left facial numbness. Upond arrival to the ED his EKG showed no acute changes and his Troponin I (high sensitivity) was 17, 19, and 17. He was ruled out for NSTEMI or STEMI. His MRI brain showed no sign of stroke. Left sided facial droop has fully resolved, possible TIA. In 2019 he had a CT coronary calcium score of 2076, placing him in the 99th percentile. His follow up stress test was negative and he declined a left heart catheterization due to concerns over the cost. Left heart catheterization on 05/10/22 shows severe 3 vessel disease. The proximal to mid RCA is 100% stenosed, first marginal is 99% stenosed, mid circumflex to distal circumflex is 99% stenoses, ostial LAD to proximal LAD is 70% stenosed, mid LAD is 100% stenoses and the diagonal is 99% stenosed. His echocardiogram shows inferior basal hypokinesis and LVEF 50-55%. He currently has  no chest tightness or facial numbness.   He states he has anxiety and depression but has never been diagnosed with Bipolar disorder. He sees a psychiatrist once every 6 months. He is currently tearful. He lives alone and works at a [employer] ([employer]).  Current Activity/ Functional Status: Patient is independent with mobility/ambulation, transfers, ADL's, IADL's.   Zubrod Score: At the time of surgery this patient's most appropriate activity status/level should be described as: []$     0    Normal activity, no symptoms [x]$     1    Restricted in physical strenuous activity but ambulatory, able to do out light work []$     2    Ambulatory and capable of self care, unable to do work activities, up and about                 more than 50%  Of the time                            []$     3    Only limited self care, in bed greater than 50% of waking hours []$     4    Completely disabled, no self care, confined to bed or chair []$     5    Moribund  Past Medical History:  Diagnosis Date   ADHD (attention  deficit hyperactivity disorder)    Anxiety state, unspecified    Bipolar 1 disorder ([place])    CHEST PAIN 12/04/2008   Qualifier: Diagnosis of  By: Glori Bickers MD, Carmell Austria    Depressive disorder, not elsewhere classified    Dermatophytosis of nail    Esophageal reflux    HYPERGLYCEMIA 07/10/2007   Qualifier: Diagnosis of  By: Glori Bickers MD, Carmell Austria    Kidney stones 09/22/2010   Low back pain 09/18/2013   Other chronic nonalcoholic liver disease    Problems with hearing    Prostatitis, unspecified    Pure hypercholesterolemia    Screen for STD (sexually transmitted disease) 04/20/2011   Sprain of cruciate ligament of knee    Tear of medial cartilage or meniscus of knee, current    TMJ (temporomandibular joint disorder) 12/25/2010   Unspecified essential hypertension    Unspecified gastritis and gastroduodenitis without mention of hemorrhage    Urethritis, unspecified     Past Surgical History:  Procedure  Laterality Date   No prior surgery      Social History   Tobacco Use  Smoking Status Never  Smokeless Tobacco Never    Social History   Substance and Sexual Activity  Alcohol Use Yes   Comment: rare     Allergies  Allergen Reactions   Sulfonamide Derivatives     REACTION: hives    Current Facility-Administered Medications  Medication Dose Route Frequency Provider Last Rate Last Admin   0.9 %  sodium chloride infusion   Intravenous Continuous [name] D, MD       0.9 %  sodium chloride infusion  250 mL Intravenous PRN Burnell Blanks, MD       acetaminophen (TYLENOL) tablet 650 mg  650 mg Oral Q4H PRN Burnell Blanks, MD       ALPRAZolam Duanne Moron) tablet 1 mg  1 mg Oral Daily PRN Burnell Blanks, MD       amLODipine (NORVASC) tablet 10 mg  10 mg Oral Daily Burnell Blanks, MD   10 mg at 05/10/22 1129   aspirin chewable tablet 81 mg  81 mg Oral Daily Burnell Blanks, MD   81 mg at 05/10/22 1129   atorvastatin (LIPITOR) tablet 40 mg  40 mg Oral Daily Burnell Blanks, MD   40 mg at 05/10/22 1129   hydrALAZINE (APRESOLINE) injection 10 mg  10 mg Intravenous Q20 Min PRN Burnell Blanks, MD       isosorbide mononitrate (IMDUR) 24 hr tablet 30 mg  30 mg Oral Daily [name] D, MD   30 mg at 05/10/22 1128   labetalol (NORMODYNE) injection 10 mg  10 mg Intravenous Q10 min PRN Burnell Blanks, MD       metoprolol succinate (TOPROL-XL) 24 hr tablet 25 mg  25 mg Oral Daily Burnell Blanks, MD   25 mg at 05/10/22 1129   multivitamin with minerals tablet 1 tablet  1 tablet Oral Daily Burnell Blanks, MD   1 tablet at 05/10/22 1129   omega-3 acid ethyl esters (LOVAZA) capsule 2 g  2 g Oral Daily [name] D, MD   2 g at 05/10/22 1130   ondansetron (ZOFRAN) injection 4 mg  4 mg Intravenous Q6H PRN Burnell Blanks, MD       sertraline (ZOLOFT) tablet 200 mg  200 mg Oral  Daily Burnell Blanks, MD   200 mg at 05/10/22 1128   sodium chloride flush (NS)  0.9 % injection 3 mL  3 mL Intravenous Q12H Burnell Blanks, MD   3 mL at 05/10/22 1132   sodium chloride flush (NS) 0.9 % injection 3 mL  3 mL Intravenous Q12H Burnell Blanks, MD       sodium chloride flush (NS) 0.9 % injection 3 mL  3 mL Intravenous PRN Burnell Blanks, MD       tamsulosin (FLOMAX) capsule 0.4 mg  0.4 mg Oral Daily [name] D, MD   0.4 mg at 05/10/22 1128   traZODone (DESYREL) tablet 100 mg  100 mg Oral QHS PRN Burnell Blanks, MD        Medications Prior to Admission  Medication Sig Dispense Refill Last Dose   ALPRAZolam (XANAX) 1 MG tablet Take 1 mg by mouth Once daily as needed for anxiety.   05/09/2022 at 2230   amLODipine (NORVASC) 10 MG tablet TAKE 2 TABLETS BY MOUTH DAILY AS DIRECTED (Patient taking differently: Take 10 mg by mouth daily.) 180 tablet 3 05/09/2022 at am   aspirin 81 MG chewable tablet Chew 1 tablet (81 mg total) by mouth daily. 30 tablet 0 05/09/2022 at am   fish oil-omega-3 fatty acids 1000 MG capsule Take 2 g by mouth daily.   05/09/2022 at am   isosorbide mononitrate (IMDUR) 30 MG 24 hr tablet Take 1 tablet (30 mg total) by mouth daily. 30 tablet 1 05/09/2022 at am   metoprolol succinate (TOPROL-XL) 25 MG 24 hr tablet Take 1 tablet (25 mg total) by mouth daily. 30 tablet 1 05/09/2022 at 0930   Multiple Vitamin (MULTIVITAMIN) capsule Take 1 capsule by mouth daily.   05/09/2022   nitroGLYCERIN (NITROSTAT) 0.4 MG SL tablet Place 1 tablet (0.4 mg total) under the tongue every 5 (five) minutes as needed for chest pain. 30 tablet 3 05/09/2022   sertraline (ZOLOFT) 100 MG tablet Take 200 mg by mouth daily.   05/09/2022 at am   simvastatin (ZOCOR) 20 MG tablet Take 20 mg by mouth at bedtime.   05/09/2022 at pm   tamsulosin (FLOMAX) 0.4 MG CAPS capsule Take 0.4 mg by mouth daily.   05/09/2022 at am   traZODone (DESYREL) 50 MG tablet Take  100 mg by mouth at bedtime as needed for sleep.   05/09/2022 at pm    Family History  Problem Relation Age of Onset   Diabetes Father    Hypertension Father    Heart failure Father   Younger sister had a heart attack in her late 45s or early 26s   Review of Systems:   Review of Systems  Constitutional:  Positive for chills, diaphoresis and malaise/fatigue. Negative for fever.  HENT:  Negative for ear pain, hearing loss and tinnitus.        A couple years since his last dental visit  Eyes:  Negative for blurred vision and double vision.  Respiratory:  Negative for cough, shortness of breath and wheezing.   Cardiovascular:  Positive for chest pain and palpitations. Negative for orthopnea and leg swelling.  Gastrointestinal:  Positive for heartburn. Negative for vomiting.  Genitourinary:  Negative for dysuria.  Musculoskeletal:  Negative for myalgias.       No varicose veins  Skin:  Negative for rash.  Neurological:  Positive for loss of consciousness. Negative for dizziness and focal weakness.       Facial numbness yesterday, now resolved. Possible TIA. Believes he had brief LOC  Endo/Heme/Allergies:  Bruises/bleeds easily.  Psychiatric/Behavioral:  Positive  for depression. Negative for suicidal ideas. The patient is nervous/anxious.        Physical Exam: BP 136/86 (BP Location: Left Arm)   Pulse 76   Temp 98.3 F (36.8 C) (Oral)   Resp 16   Ht 5' 6"$  (1.676 m)   Wt 89.8 kg Comment: Pt weighed at home  SpO2 93%   BMI 31.96 kg/m   General appearance: alert, cooperative, and no distress Head: Normocephalic, without obvious abnormality, atraumatic Neck: no adenopathy, no carotid bruit, no JVD, supple, symmetrical, trachea midline, and thyroid not enlarged, symmetric, no tenderness/mass/nodules Lymph nodes: Cervical, supraclavicular, and axillary nodes normal. Resp: clear to auscultation bilaterally Cardio: regular rate and rhythm, S1, S2 normal, no murmur, click, rub or  gallop GI: soft, non-tender; bowel sounds normal; no masses,  no organomegaly Extremities: No edema, no varicose veins, DP/PT pulses 2+ Neurologic: Grossly normal. Alert and oriented X 3, normal strength and tone. No focal weakness or numbness.  Diagnostic Studies & Radiology Findings:     LEFT HEART CATH AND CORONARY ANGIOGRAPHY   Conclusion    Prox RCA to Mid RCA lesion is 100% stenosed.   1st Mrg lesion is 99% stenosed.   Mid Cx to Dist Cx lesion is 99% stenosed.   Ost LAD to Prox LAD lesion is 70% stenosed.   Mid LAD lesion is 100% stenosed.   1st Diag lesion is 99% stenosed.   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 Severe disease in a moderate caliber Diagonal branch that arises prior to the total LAD occlusion.  Large caliber Circumflex with diffuse severe distal stenosis leading into the most distal obtuse marginal branch. (Functional CTO). The first obtuse marginal branch is a moderate caliber vessel with severe ostial stenosis.  The RCA is a large dominant artery with chronic total occlusion of the mid vessel The distal vessel fills from right to right collaterals and left to right collaterals.   Dominance: Right  ECHOCARDIOGRAM REPORT    Patient Name:   Glenn Stewart Date of Exam: 05/10/2022  Medical Rec #:  [id number]        Height:       66.0 in  Accession #:    [id number]       Weight:       216.6 lb  Date of Birth:  Sep 06, 1965         BSA:          2.069 m  Patient Age:    73 years         BP:           148/100 mmHg  Patient Gender: M                HR:           61 bpm.  Exam Location:  Inpatient   Procedure: 2D Echo, Color Doppler and Cardiac Doppler   Indications:    TIA    History:        Patient has no prior history of Echocardiogram  examinations.                 CAD; Risk Factors:Hypertension and Dyslipidemia.    Sonographer:    Raquel Sarna Senior RDCS  Referring Phys:  [place]    1. Inferior basal hypokinesis . Left ventricular ejection fraction, by  estimation, is 50 to 55%.  The left ventricle has low normal function. The  left ventricle demonstrates regional wall motion abnormalities (see  scoring diagram/findings for  description). Left ventricular diastolic parameters were normal.   2. Right ventricular systolic function is normal. The right ventricular  size is normal.   3. Left atrial size was mildly dilated.   4. The mitral valve is abnormal. Trivial mitral valve regurgitation. No  evidence of mitral stenosis.   5. The aortic valve is tricuspid. There is mild calcification of the  aortic valve. Aortic valve regurgitation is not visualized. Aortic valve  sclerosis is present, with no evidence of aortic valve stenosis.   6. The inferior vena cava is normal in size with greater than 50%  respiratory variability, suggesting right atrial pressure of 3 mmHg.   FINDINGS   Left Ventricle: Inferior basal hypokinesis. Left ventricular ejection  fraction, by estimation, is 50 to 55%. The left ventricle has low normal  function. The left ventricle demonstrates regional wall motion  abnormalities. The left ventricular internal  cavity size was normal in size. There is no left ventricular hypertrophy.  Left ventricular diastolic parameters were normal.   Right Ventricle: The right ventricular size is normal. No increase in  right ventricular wall thickness. Right ventricular systolic function is  normal.   Left Atrium: Left atrial size was mildly dilated.   Right Atrium: Right atrial size was normal in size.   Pericardium: There is no evidence of pericardial effusion.   Mitral Valve: The mitral valve is abnormal. There is mild thickening of  the mitral valve leaflet(s). Trivial mitral valve regurgitation. No  evidence of mitral valve stenosis.   Tricuspid Valve: The tricuspid valve is normal in structure. Tricuspid   valve regurgitation is not demonstrated. No evidence of tricuspid  stenosis.   Aortic Valve: The aortic valve is tricuspid. There is mild calcification  of the aortic valve. Aortic valve regurgitation is not visualized. Aortic  valve sclerosis is present, with no evidence of aortic valve stenosis.   Pulmonic Valve: The pulmonic valve was normal in structure. Pulmonic valve  regurgitation is not visualized. No evidence of pulmonic stenosis.   Aorta: The aortic root is normal in size and structure.   Venous: The inferior vena cava is normal in size with greater than 50%  respiratory variability, suggesting right atrial pressure of 3 mmHg.   IAS/Shunts: No atrial level shunt detected by color flow Doppler.     LEFT VENTRICLE  PLAX 2D  LVIDd:         5.70 cm      Diastology  LVIDs:         4.30 cm      LV e' medial:    7.72 cm/s  LV PW:         0.80 cm      LV E/e' medial:  9.2  LV IVS:        0.90 cm      LV e' lateral:   13.20 cm/s  LVOT diam:     2.30 cm      LV E/e' lateral: 5.4  LV SV:         80  LV SV Index:   39  LVOT Area:     4.15 cm    LV Volumes (MOD)  LV vol d, MOD A2C: 120.0 ml  LV vol d, MOD A4C: 116.0 ml  LV vol s, MOD A2C: 51.8 ml  LV vol s, MOD A4C: 60.1 ml  LV  SV MOD A2C:     68.2 ml  LV SV MOD A4C:     116.0 ml  LV SV MOD BP:      61.7 ml   RIGHT VENTRICLE  RV S prime:     14.60 cm/s  TAPSE (M-mode): 1.7 cm   LEFT ATRIUM             Index        RIGHT ATRIUM           Index  LA diam:        3.90 cm 1.89 cm/m   RA Area:     15.80 cm  LA Vol (A2C):   51.7 ml 24.99 ml/m  RA Volume:   33.40 ml  16.15 ml/m  LA Vol (A4C):   42.7 ml 20.64 ml/m  LA Biplane Vol: 48.2 ml 23.30 ml/m   AORTIC VALVE  LVOT Vmax:   90.20 cm/s  LVOT Vmean:  67.800 cm/s  LVOT VTI:    0.193 m    AORTA  Ao Root diam: 3.50 cm  Ao Asc diam:  3.50 cm   MITRAL VALVE  MV Area (PHT): 3.33 cm    SHUNTS  MV Decel Time: 228 msec    Systemic VTI:  0.19 m  MV E velocity: 71.30  cm/s  Systemic Diam: 2.30 cm  MV A velocity: 67.10 cm/s  MV E/A ratio:  1.06   Jenkins Rouge MD  Electronically signed by Jenkins Rouge MD  Signature Date/Time: 05/10/2022/11:34:55 AM      Final     Assessment and Plan:  CAD: Severe 3 vessel CAD. Patient willing to undergo CABG surgery, but tearful and anxious. Lives alone but states his sister may be able to help him after surgery. Dr. Roxan Hockey to determine candidacy and timing for CABG surgery.   Anxiety/Depression: Sertraline and Xanax PRN, visits a psychiatrist every 6 months. Will need closer outpatient follow up.   HTN: Well controlled. Norvasc, Toprol XL and Imdur  HLD: Simvastatin  Magdalene River, PA-C 05/10/22  I have seen and examined Mr. Huelsmann, reviewed records, echo report and cath images.  57 yo man with known CAD presented after syncopal event associated with left facial numbness.  Workup negative for stroke.  Cath shows severe 3 vessel CAD with preserved LV systolic function.  CABG indicated for survival benefit and relief of symptoms.  I discussed the general nature of the procedure, including the need for general anesthesia, the incisions to be used, the use of cardiopulmonary bypass, and the use of drainage tubes and pacing wires postoperatively with Mr. Taubman.  We discussed the expected hospital stay, overall recovery and short and long term outcomes.  I informed him of the indications, risks, benefits and alternatives.  He understands the risks include, but are not limited to death, stroke, MI, DVT/PE, bleeding, possible need for transfusion, infections, cardiac arrhythmias, as well as other organ system dysfunction including respiratory, renal, or GI complications.   Normal Allen's test left arm- will plan to use left radial.  He accepts the risks and agrees to proceed.   For CABG tomorrow.  Revonda Standard Roxan Hockey, MD [facility] [phone]

## 2022-05-10 NOTE — ED Provider Notes (Signed)
[facility] Provider Note   CSN: [id number] Arrival date & time: 05/09/22  1757     History  Chief Complaint  Patient presents with   Loss of Consciousness    Glenn Stewart is a 57 y.o. male.  The history is provided by the patient.  Patient with hypertension, hyperlipidemia presents with multiple complaints Patient reports about 2 nights ago he woke up drenched in sweat.  He went to the bathroom and then he reports he woke up in the floor.  He is unsure how he fell.  Unknown if this was syncope.  No head injury or other traumatic injury.  Since that time he has had intermittent random episodes of chest tightness that resolved spontaneously.  No significant shortness of breath.  No fevers or vomiting. Patient also reports that about 5 PM on February 18 he felt that his face was numb on the left side.  He looked in the mirror and appeared to be weak and drooping on the left face.  He had no other weakness reported.  Patient called 911 and he was reassured by the paramedics he was not having a stroke and they told him it was likely stress and anxiety.  However he was transported to the hospital. No previous history of CAD/CVA.   Past Medical History:  Diagnosis Date   ADHD (attention deficit hyperactivity disorder)    Anxiety state, unspecified    Bipolar 1 disorder ([place])    CHEST PAIN 12/04/2008   Qualifier: Diagnosis of  By: Glori Bickers MD, Carmell Austria    Depressive disorder, not elsewhere classified    Dermatophytosis of nail    Esophageal reflux    HYPERGLYCEMIA 07/10/2007   Qualifier: Diagnosis of  By: Glori Bickers MD, Carmell Austria    Kidney stones 09/22/2010   Low back pain 09/18/2013   Other chronic nonalcoholic liver disease    Problems with hearing    Prostatitis, unspecified    Pure hypercholesterolemia    Screen for STD (sexually transmitted disease) 04/20/2011   Sprain of cruciate ligament of knee    Tear of medial cartilage or meniscus of  knee, current    TMJ (temporomandibular joint disorder) 12/25/2010   Unspecified essential hypertension    Unspecified gastritis and gastroduodenitis without mention of hemorrhage    Urethritis, unspecified     Home Medications Prior to Admission medications   Medication Sig Start Date End Date Taking? Authorizing Provider  ALPRAZolam Duanne Moron) 1 MG tablet Take 1 tablet by mouth Once daily as needed for anxiety.  04/17/11   [provider]  amLODipine (NORVASC) 10 MG tablet TAKE 2 TABLETS BY MOUTH DAILY AS DIRECTED 03/30/21   Lelon Perla, MD  aspirin 81 MG chewable tablet Chew 1 tablet (81 mg total) by mouth daily. 03/20/20   Couture, Cortni S, PA-C  fish oil-omega-3 fatty acids 1000 MG capsule Take 2 g by mouth daily.    [provider]  isosorbide mononitrate (IMDUR) 30 MG 24 hr tablet Take 1 tablet (30 mg total) by mouth daily. 03/10/22   Lelon Perla, MD  metoprolol succinate (TOPROL-XL) 25 MG 24 hr tablet Take 1 tablet (25 mg total) by mouth daily. 03/10/22   Lelon Perla, MD  Multiple Vitamin (MULTIVITAMIN) capsule Take 1 capsule by mouth daily.    [provider]  nitroGLYCERIN (NITROSTAT) 0.4 MG SL tablet Place 1 tablet (0.4 mg total) under the tongue every 5 (five) minutes as needed for  chest pain. 03/02/21   Lelon Perla, MD  sertraline (ZOLOFT) 100 MG tablet Take 100 mg by mouth daily.    [provider]  simvastatin (ZOCOR) 20 MG tablet TAKE ONE TABLET BY MOUTH DAILY AT BEDTIME 04/29/22   Lelon Perla, MD      Allergies    Sulfonamide derivatives    Review of Systems   Review of Systems  Constitutional:  Negative for fever.  HENT:         Hard of hearing at baseline  Eyes:  Negative for visual disturbance.  Respiratory:  Positive for shortness of breath.   Cardiovascular:  Positive for chest pain.  Neurological:  Positive for facial asymmetry.    Physical Exam Updated Vital Signs BP (!) 138/91   Pulse 82   Temp  98.6 F (37 C) (Oral)   Resp 18   SpO2 95%  Physical Exam CONSTITUTIONAL: Well developed/well nourished, anxious HEAD: Normocephalic/atraumatic EYES: EOMI/PERRL, no nystagmus, no visual field deficit  no ptosis ENMT: Mucous membranes moist NECK: supple no meningeal signs, no bruits CV: S1/S2 noted, no murmurs/rubs/gallops noted LUNGS: Lungs are clear to auscultation bilaterally, no apparent distress ABDOMEN: soft, nontender, no rebound or guarding GU:no cva tenderness NEURO:Awake/alert, face symmetric, no arm or leg drift is noted Equal 5/5 strength with shoulder abduction, elbow flex/extension, wrist flex/extension in upper extremities and equal hand grips bilaterally Equal 5/5 strength with hip flexion,knee flex/extension, foot dorsi/plantar flexion Cranial nerves 3/4/5/6/09/27/08/11/12 tested and intact No past pointing Sensation to light touch intact in all extremities EXTREMITIES: pulses normal, full ROM, missing tip of the right index finger SKIN: warm, color normal PSYCH: Anxious   ED Results / Procedures / Treatments   Labs (all labs ordered are listed, but only abnormal results are displayed) Labs Reviewed  BASIC METABOLIC PANEL - Abnormal; Notable for the following components:      Result Value   CO2 19 (*)    Glucose, Bld 112 (*)    All other components within normal limits  CBC  RAPID URINE DRUG SCREEN, HOSP PERFORMED  APTT  PROTIME-INR  ETHANOL  TROPONIN I (HIGH SENSITIVITY)  TROPONIN I (HIGH SENSITIVITY)    EKG EKG Interpretation  Date/Time:  Sunday May 09 2022 18:01:39 EST Ventricular Rate:  85 PR Interval:  174 QRS Duration: 100 QT Interval:  364 QTC Calculation: 433 R Axis:   65 Text Interpretation: Normal sinus rhythm Nonspecific T wave abnormality Abnormal ECG Interpretation limited secondary to artifact Confirmed by Ripley Fraise [phone]) on 05/09/2022 11:41:24 PM  Radiology CT HEAD WO CONTRAST  Result Date: 05/10/2022 CLINICAL DATA:   Recent syncopal episode, initial encounter EXAM: CT HEAD WITHOUT CONTRAST TECHNIQUE: Contiguous axial images were obtained from the base of the skull through the vertex without intravenous contrast. RADIATION DOSE REDUCTION: This exam was performed according to the departmental dose-optimization program which includes automated exposure control, adjustment of the mA and/or kV according to patient size and/or use of iterative reconstruction technique. COMPARISON:  None Available. FINDINGS: Brain: No evidence of acute infarction, hemorrhage, hydrocephalus, extra-axial collection or mass lesion/mass effect. Vascular: No hyperdense vessel or unexpected calcification. Skull: Normal. Negative for fracture or focal lesion. Sinuses/Orbits: No acute finding. Other: None. IMPRESSION: No acute intracranial abnormality noted. Electronically Signed   By: Inez Catalina M.D.   On: 05/10/2022 01:24    Procedures Procedures    Medications Ordered in ED Medications  aspirin chewable tablet 324 mg (324 mg Oral Given 05/10/22 0311)  nitroGLYCERIN (NITROGLYN) 2 %  ointment 0.5 inch (0.5 inches Topical Given 05/10/22 [id number])    ED Course/ Medical Decision Making/ A&P Clinical Course as of 05/10/22 [id number]  [facility] May 10, 2022  0033 Patient with complicated story.  He had intermittent episodes of chest tightness with recent syncope.  Also reports isolated episode of left facial numbness and weakness earlier in the day.  He is now back to baseline.  Patient reports he does not have insurance and is very concerned about cost.  We had an extensive discussion about the need for further testing.  He agrees to obtain a troponin as well as a CT head.  He understands limitations of CT head in  evaluation of CVA. [DW]  [id number] I had extensive discussions with patient.  I am so concerned about a possible TIA with recent left-sided facial weakness.  Also concerned about a possibility of having underlying unstable angina.  Patient has been  recommended for coronary angiogram previously.  After multiple discussions, patient will be admitted.  Nitroglycerin and aspirin has been ordered [DW]  [id number] Discussed with Dr. Alcario Drought for admission.  Patient will be admitted for chest pain workup as well as a TIA workup. [DW]    Clinical Course User Index [DW] Ripley Fraise, MD                             Medical Decision Making Amount and/or Complexity of Data Reviewed Labs: ordered. Radiology: ordered.  Risk OTC drugs. Prescription drug management. Decision regarding hospitalization.   This patient presents to the ED for concern of facial weakness and chest tightness, this involves an extensive number of treatment options, and is a complaint that carries with it a high risk of complications and morbidity.  The differential diagnosis includes but is not limited to acute coronary syndrome, aortic dissection, pulmonary embolism, pericarditis, pneumothorax, pneumonia, myocarditis, pleurisy, esophageal rupture CVA, intracranial hemorrhage, renal failure, urinary tract infection, electrolyte disturbance, pneumonia    Comorbidities that complicate the patient evaluation: Patient's presentation is complicated by their history of hypertension  Social Determinants of Health: Patient's lack of insurance and hard of hearing   increases the complexity of managing their presentation  Additional history obtained: Additional history obtained from family Records reviewed  cardiology notes reviewed, patient was offered coronary angiogram due to elevated calcium score in 2022  Lab Tests: I Ordered, and personally interpreted labs.  The pertinent results include: Labs overall unremarkable  Imaging Studies ordered: I ordered imaging studies including CT scan head   I independently visualized and interpreted imaging which showed no acute findings I agree with the radiologist interpretation  Cardiac Monitoring: The patient was maintained on a  cardiac monitor.  I personally viewed and interpreted the cardiac monitor which showed an underlying rhythm of:  sinus rhythm  Medicines ordered and prescription drug management: I ordered medication including aspirin and nitroglycerin for chest pain   Critical Interventions:   aspirin, nitroglycerin, admission  Consultations Obtained: I requested consultation with the admitting physician triad , and discussed  findings as well as pertinent plan - they recommend: admit  Reevaluation: After the interventions noted above, I reevaluated the patient and found that they have :stayed the same  Complexity of problems addressed: Patient's presentation is most consistent with  acute presentation with potential threat to life or bodily function  Disposition: After consideration of the diagnostic results and the patient's response to treatment,  I feel that the patent would benefit from admission   .  Final Clinical Impression(s) / ED Diagnoses Final diagnoses:  Chest pain due to myocardial ischemia, unspecified ischemic chest pain type  TIA (transient ischemic attack)    Rx / DC Orders ED Discharge Orders     None         Ripley Fraise, MD 05/10/22 [phone]

## 2022-05-10 NOTE — Assessment & Plan Note (Signed)
Check lipid pnl Adjust statin dose if needed.

## 2022-05-10 NOTE — Interval H&P Note (Signed)
History and Physical Interval Note:  05/10/2022 1:18 PM  Glenn Stewart  has presented today for surgery, with the diagnosis of chest pain.  The various methods of treatment have been discussed with the patient and family. After consideration of risks, benefits and other options for treatment, the patient has consented to  Procedure(s): LEFT HEART CATH AND CORONARY ANGIOGRAPHY (N/A) as a surgical intervention.  The patient's history has been reviewed, patient examined, no change in status, stable for surgery.  I have reviewed the patient's chart and labs.  Questions were answered to the patient's satisfaction.    Cath Lab Visit (complete for each Cath Lab visit)  Clinical Evaluation Leading to the Procedure:   ACS: No.  Non-ACS:    Anginal Classification: CCS III  Anti-ischemic medical therapy: Maximal Therapy (2 or more classes of medications)  Non-Invasive Test Results: No non-invasive testing performed  Prior CABG: No previous CABG        Glenn Stewart

## 2022-05-10 NOTE — ED Notes (Signed)
Pt reports he called EMS d/t having L chest pain and L facial droop.  Sts facial droop only lasted "a few minutes."  Currently denies chest pain. Pt sts history of "extremely high calcium levels."

## 2022-05-10 NOTE — H&P (Signed)
History and Physical    Patient: Glenn Stewart [id number] DOB: 18-Aug-1965 DOA: 05/09/2022 DOS: the patient was seen and examined on 05/10/2022 PCP: Abner Greenspan, MD  Patient coming from: Home  Chief Complaint:  Chief Complaint  Patient presents with   Loss of Consciousness   HPI: Glenn Stewart is a 57 y.o. male with medical history significant of HTN, HLD, NASH.  Pt woke up 2 nights ago drenched in sweat.  Went to bathroom, apparently had syncope and woke up on floor.  No head injury.  Since that time has had episodes of intermittent random episodes of chest tightness that resolved spontaneously.  No SOB, no fevers, no vomiting.  5pm on Feb 18th, had L facial numbness and droop.  Called 911 but symptoms resolved by time paramedics arrived.  Never had LHC, but he had a CT coronary calcium score done in 2019 which came back at 2076, this is the 99th percentile.  F/u stress test was negative however.  From cards notes it sounds like LHC had been offered but he declined due to concerns over cost.    Review of Systems: As mentioned in the history of present illness. All other systems reviewed and are negative. Past Medical History:  Diagnosis Date   ADHD (attention deficit hyperactivity disorder)    Anxiety state, unspecified    Bipolar 1 disorder ([place])    CHEST PAIN 12/04/2008   Qualifier: Diagnosis of  By: Glori Bickers MD, Carmell [place]    Depressive disorder, not elsewhere classified    Dermatophytosis of nail    Esophageal reflux    HYPERGLYCEMIA 07/10/2007   Qualifier: Diagnosis of  By: Glori Bickers MD, Carmell [place]    Kidney stones 09/22/2010   Low back pain 09/18/2013   Other chronic nonalcoholic liver disease    Problems with hearing    Prostatitis, unspecified    Pure hypercholesterolemia    Screen for STD (sexually transmitted disease) 04/20/2011   Sprain of cruciate ligament of knee    Tear of medial cartilage or meniscus of knee, current    TMJ (temporomandibular joint disorder)  12/25/2010   Unspecified essential hypertension    Unspecified gastritis and gastroduodenitis without mention of hemorrhage    Urethritis, unspecified    Past Surgical History:  Procedure Laterality Date   No prior surgery     Social History:  reports that he has never smoked. He has never used smokeless tobacco. He reports current alcohol use. He reports that he does not use drugs.  Allergies  Allergen Reactions   Sulfonamide Derivatives     REACTION: hives    Family History  Problem Relation Age of Onset   Diabetes Father    Hypertension Father    Heart failure Father     Prior to Admission medications   Medication Sig Start Date End Date Taking? Authorizing Provider  ALPRAZolam Duanne Moron) 1 MG tablet Take 1 mg by mouth Once daily as needed for anxiety. 04/17/11  Yes [provider]  amLODipine (NORVASC) 10 MG tablet TAKE 2 TABLETS BY MOUTH DAILY AS DIRECTED Patient taking differently: Take 10 mg by mouth daily. 03/30/21  Yes Lelon Perla, MD  aspirin 81 MG chewable tablet Chew 1 tablet (81 mg total) by mouth daily. 03/20/20  Yes Couture, Cortni S, PA-C  fish oil-omega-3 fatty acids 1000 MG capsule Take 2 g by mouth daily.   Yes [provider]  isosorbide mononitrate (IMDUR) 30 MG 24 hr tablet Take 1 tablet (30 mg  total) by mouth daily. 03/10/22  Yes Lelon Perla, MD  metoprolol succinate (TOPROL-XL) 25 MG 24 hr tablet Take 1 tablet (25 mg total) by mouth daily. 03/10/22  Yes Lelon Perla, MD  Multiple Vitamin (MULTIVITAMIN) capsule Take 1 capsule by mouth daily.   Yes [provider]  nitroGLYCERIN (NITROSTAT) 0.4 MG SL tablet Place 1 tablet (0.4 mg total) under the tongue every 5 (five) minutes as needed for chest pain. 03/02/21  Yes Lelon Perla, MD  sertraline (ZOLOFT) 100 MG tablet Take 200 mg by mouth daily.   Yes [provider]  simvastatin (ZOCOR) 20 MG tablet Take 20 mg by mouth at bedtime.   Yes [provider]  tamsulosin (FLOMAX) 0.4 MG CAPS capsule Take 0.4 mg by mouth daily.   Yes [provider]  traZODone (DESYREL) 50 MG tablet Take 100 mg by mouth at bedtime as needed for sleep.   Yes [provider]    Physical Exam: Vitals:   05/09/22 1758 05/09/22 2214 05/10/22 0045 05/10/22 0223  BP: 136/86 (!) 141/100 (!) 138/91   Pulse: 86 83 70 82  Resp: 16 17 17 18  $ Temp: 98 F (36.7 C) 98.2 F (36.8 C)  98.6 F (37 C)  TempSrc: Oral   Oral  SpO2: 96% 93% 95%    Constitutional: NAD, calm, comfortable Respiratory: clear to auscultation bilaterally, no wheezing, no crackles. Normal respiratory effort. No accessory muscle use.  Cardiovascular: Regular rate and rhythm, no murmurs / rubs / gallops. No extremity edema. 2+ pedal pulses. No carotid bruits.  Abdomen: no tenderness, no masses palpated. No hepatosplenomegaly. Bowel sounds positive.  Neurologic: CN 2-12 grossly intact. Sensation intact, DTR normal. Strength 5/5 in all 4.  Psychiatric: Normal judgment and insight. Alert and oriented x 3. Normal mood.   Data Reviewed:       Latest Ref Rng & Units 05/09/2022    6:02 PM 09/29/2020    7:48 PM 03/20/2020    5:22 PM  CBC  WBC 4.0 - 10.5 K/uL 6.5  10.7  6.8   Hemoglobin 13.0 - 17.0 g/dL 16.2  13.8  16.3   Hematocrit 39.0 - 52.0 % 45.4  41.9  48.9   Platelets 150 - 400 K/uL 259  279  251       Latest Ref Rng & Units 05/09/2022    6:02 PM 09/29/2020    7:48 PM 03/20/2020    5:22 PM  CMP  Glucose 70 - 99 mg/dL 112  92  90   BUN 6 - 20 mg/dL 15  12  16   $ Creatinine 0.61 - 1.24 mg/dL 0.94  0.80  0.87   Sodium 135 - 145 mmol/L 137  137  137   Potassium 3.5 - 5.1 mmol/L 3.6  3.7  4.0   Chloride 98 - 111 mmol/L 105  102  102   CO2 22 - 32 mmol/L 19  24  26   $ Calcium 8.9 - 10.3 mg/dL 9.6  9.5  9.6   Total Protein 6.5 - 8.1 g/dL  7.2  7.7   Total Bilirubin 0.3 - 1.2 mg/dL  1.5  0.8   Alkaline Phos 38 - 126 U/L  72  54   AST 15 - 41 U/L  35  27   ALT 0 - 44 U/L  56   32    Trop 17  Assessment and Plan: * Chest pain, rule out acute myocardial infarction Intermittent chest tightness and not  really greatest story, HEART score wont be that elevated; however, presentation is none the less concerning for CAD given that coronary calcium score of 2076 which places him in the 99th percentile! CP obs pathway NPO Tele monitor Cards eval in AM.  Facial droop Report of facial droop symptoms that have resolved at current time. Will get MRI brain to r/o stroke Full stroke work up if positive.  Essential hypertension Cont home BP meds  Dyslipidemia, goal LDL below 70 Check lipid pnl Adjust statin dose if needed.      Advance Care Planning:   Code Status: Full Code  Consults: Message sent to P. Trent for cards eval in AM  Family Communication: No family in room  Severity of Illness: The appropriate patient status for this patient is OBSERVATION. Observation status is judged to be reasonable and necessary in order to provide the required intensity of service to ensure the patient's safety. The patient's presenting symptoms, physical exam findings, and initial radiographic and laboratory data in the context of their medical condition is felt to place them at decreased risk for further clinical deterioration. Furthermore, it is anticipated that the patient will be medically stable for discharge from the hospital within 2 midnights of admission.   Author: Etta Quill., DO 05/10/2022 4:54 AM  For on call review [email or web address].

## 2022-05-10 NOTE — Assessment & Plan Note (Signed)
Cont home BP meds ?

## 2022-05-10 NOTE — Assessment & Plan Note (Signed)
Intermittent chest tightness and not really greatest story, HEART score wont be that elevated; however, presentation is none the less concerning for CAD given that coronary calcium score of 2076 which places him in the 99th percentile! CP obs pathway NPO Tele monitor Cards eval in AM.

## 2022-05-10 NOTE — H&P (View-Only) (Signed)
CARDIOLOGY CONSULT NOTE       Patient ID: SHIVANG MCRIGHT MRN: [id number] DOB/AGE: April 26, 1965 57 y.o.  Admit date: 05/09/2022 Referring Physician: Sloan Leiter Primary Physician: Tower, Wynelle Fanny, MD Primary Cardiologist: Stanford Breed Reason for Consultation: Chest pain  Principal Problem:   Chest pain, rule out acute myocardial infarction Active Problems:   Dyslipidemia, goal LDL below 70   Essential hypertension   Facial droop   HPI:  57 y.o. with history of anxiety/depression bipolar HLD and HTN Previously seen by Dr Stanford Breed for SSCP  Calcium score done 09/01/17 was 2076,  99 th percentile Patient deferred stress testing/cath due to insurance reasons Not clear how compliant he has been with meds. Admitted with SSCP diaphoresis and ? TIA with left facial droop now resolved MRI head negative Carotid with plaque no stenosis ECG with no acute changes and troponin negative x 2.  His anxiety is not well Rx He has seen a psychologist but currently only every 6 months Stresses about elderly mom in [place], dysfunctional family and financial issues   ROS All other systems reviewed and negative except as noted above  Past Medical History:  Diagnosis Date   ADHD (attention deficit hyperactivity disorder)    Anxiety state, unspecified    Bipolar 1 disorder ([place])    CHEST PAIN 12/04/2008   Qualifier: Diagnosis of  By: Glori Bickers MD, Carmell Austria    Depressive disorder, not elsewhere classified    Dermatophytosis of nail    Esophageal reflux    HYPERGLYCEMIA 07/10/2007   Qualifier: Diagnosis of  By: Glori Bickers MD, Carmell Austria    Kidney stones 09/22/2010   Low back pain 09/18/2013   Other chronic nonalcoholic liver disease    Problems with hearing    Prostatitis, unspecified    Pure hypercholesterolemia    Screen for STD (sexually transmitted disease) 04/20/2011   Sprain of cruciate ligament of knee    Tear of medial cartilage or meniscus of knee, current    TMJ (temporomandibular joint disorder) 12/25/2010    Unspecified essential hypertension    Unspecified gastritis and gastroduodenitis without mention of hemorrhage    Urethritis, unspecified     Family History  Problem Relation Age of Onset   Diabetes Father    Hypertension Father    Heart failure Father     Social History   Socioeconomic History   Marital status: Single    Spouse name: Not on file   Number of children: 0   Years of education: Not on file   Highest education level: Not on file  Occupational History   Occupation: Owns motorcycle shop  Tobacco Use   Smoking status: Never   Smokeless tobacco: Never  Vaping Use   Vaping Use: Never used  Substance and Sexual Activity   Alcohol use: Yes    Comment: rare   Drug use: No   Sexual activity: Not on file  Other Topics Concern   Not on file  Social History Narrative   Not on file   Social Determinants of Health   Financial Resource Strain: Not on file  Food Insecurity: Not on file  Transportation Needs: Not on file  Physical Activity: Not on file  Stress: Not on file  Social Connections: Not on file  Intimate Partner Violence: Not on file    Past Surgical History:  Procedure Laterality Date   No prior surgery        Current Facility-Administered Medications:    acetaminophen (TYLENOL) tablet 650 mg, 650 mg, Oral,  Q4H PRN, Etta Quill, DO   ALPRAZolam Duanne Moron) tablet 1 mg, 1 mg, Oral, Daily PRN, Alcario Drought, Jared M, DO   amLODipine (NORVASC) tablet 10 mg, 10 mg, Oral, Daily, Alcario Drought, Jared M, DO   aspirin chewable tablet 81 mg, 81 mg, Oral, Daily, Alcario Drought, Jared M, DO   atorvastatin (LIPITOR) tablet 40 mg, 40 mg, Oral, Daily, Ghimire, Henreitta Leber, MD   enoxaparin (LOVENOX) injection 40 mg, 40 mg, Subcutaneous, Q24H, Alcario Drought, Jared M, DO   isosorbide mononitrate (IMDUR) 24 hr tablet 30 mg, 30 mg, Oral, Daily, Alcario Drought, Jared M, DO   metoprolol succinate (TOPROL-XL) 24 hr tablet 25 mg, 25 mg, Oral, Daily, Alcario Drought, Jared M, DO   multivitamin with minerals  tablet 1 tablet, 1 tablet, Oral, Daily, Alcario Drought, Jared M, DO   omega-3 acid ethyl esters (LOVAZA) capsule 2 g, 2 g, Oral, Daily, Alcario Drought, Jared M, DO   ondansetron [facility]) injection 4 mg, 4 mg, Intravenous, Q6H PRN, Alcario Drought, Jared M, DO   sertraline (ZOLOFT) tablet 200 mg, 200 mg, Oral, Daily, Alcario Drought, Jared M, DO   tamsulosin [facility]) capsule 0.4 mg, 0.4 mg, Oral, Daily, Alcario Drought, Jared M, DO   traZODone (DESYREL) tablet 100 mg, 100 mg, Oral, QHS PRN, Etta Quill, DO  Current Outpatient Medications:    ALPRAZolam (XANAX) 1 MG tablet, Take 1 mg by mouth Once daily as needed for anxiety., Disp: , Rfl:    amLODipine (NORVASC) 10 MG tablet, TAKE 2 TABLETS BY MOUTH DAILY AS DIRECTED (Patient taking differently: Take 10 mg by mouth daily.), Disp: 180 tablet, Rfl: 3   aspirin 81 MG chewable tablet, Chew 1 tablet (81 mg total) by mouth daily., Disp: 30 tablet, Rfl: 0   fish oil-omega-3 fatty acids 1000 MG capsule, Take 2 g by mouth daily., Disp: , Rfl:    isosorbide mononitrate (IMDUR) 30 MG 24 hr tablet, Take 1 tablet (30 mg total) by mouth daily., Disp: 30 tablet, Rfl: 1   metoprolol succinate (TOPROL-XL) 25 MG 24 hr tablet, Take 1 tablet (25 mg total) by mouth daily., Disp: 30 tablet, Rfl: 1   Multiple Vitamin (MULTIVITAMIN) capsule, Take 1 capsule by mouth daily., Disp: , Rfl:    nitroGLYCERIN (NITROSTAT) 0.4 MG SL tablet, Place 1 tablet (0.4 mg total) under the tongue every 5 (five) minutes as needed for chest pain., Disp: 30 tablet, Rfl: 3   sertraline (ZOLOFT) 100 MG tablet, Take 200 mg by mouth daily., Disp: , Rfl:    simvastatin (ZOCOR) 20 MG tablet, Take 20 mg by mouth at bedtime., Disp: , Rfl:    tamsulosin (FLOMAX) 0.4 MG CAPS capsule, Take 0.4 mg by mouth daily., Disp: , Rfl:    traZODone (DESYREL) 50 MG tablet, Take 100 mg by mouth at bedtime as needed for sleep., Disp: , Rfl:   amLODipine  10 mg Oral Daily   aspirin  81 mg Oral Daily   atorvastatin  40 mg Oral Daily   enoxaparin  (LOVENOX) injection  40 mg Subcutaneous Q24H   isosorbide mononitrate  30 mg Oral Daily   metoprolol succinate  25 mg Oral Daily   multivitamin with minerals  1 tablet Oral Daily   omega-3 acid ethyl esters  2 g Oral Daily   sertraline  200 mg Oral Daily   tamsulosin  0.4 mg Oral Daily     Physical Exam: Blood pressure (!) 148/100, pulse 62, temperature 98 F (36.7 C), temperature source Oral, resp. rate 18, SpO2 96 %.    Affect  appropriate Healthy:  appears stated age 29: normal Neck supple with no adenopathy JVP normal no bruits no thyromegaly Lungs clear with no wheezing and good diaphragmatic motion Heart:  S1/S2 no murmur, no rub, gallop or click PMI normal Abdomen: benighn, BS positve, no tenderness, no AAA no bruit.  No HSM or HJR Distal pulses intact with no bruits No edema Neuro non-focal Skin warm and dry No muscular weakness   Labs:   Lab Results  Component Value Date   WBC 6.5 05/09/2022   HGB 16.2 05/09/2022   HCT 45.4 05/09/2022   MCV 80.2 05/09/2022   PLT 259 05/09/2022    Recent Labs  Lab 05/09/22 1802  NA 137  K 3.6  CL 105  CO2 19*  BUN 15  CREATININE 0.94  CALCIUM 9.6  GLUCOSE 112*   No results found for: "CKTOTAL", "CKMB", "CKMBINDEX", "TROPONINI"  Lab Results  Component Value Date   CHOL 182 05/10/2022   CHOL 140 04/03/2020   CHOL 200 09/18/2013   Lab Results  Component Value Date   HDL 38 (L) 05/10/2022   HDL 39.70 04/03/2020   HDL 46.70 09/18/2013   Lab Results  Component Value Date   LDLCALC 108 (H) 05/10/2022   LDLCALC 72 04/03/2020   LDLCALC 90 09/18/2013   Lab Results  Component Value Date   TRIG 180 (H) 05/10/2022   TRIG 145.0 04/03/2020   TRIG 315.0 (H) 09/18/2013   Lab Results  Component Value Date   CHOLHDL 4.8 05/10/2022   CHOLHDL 4 04/03/2020   CHOLHDL 4 09/18/2013   Lab Results  Component Value Date   LDLDIRECT 117.5 04/10/2012   LDLDIRECT 125.9 07/22/2010   LDLDIRECT 168.3 10/29/2009       Radiology: VAS US CAROTID  Result Date: 05/10/2022 Carotid Arterial Duplex Study Patient Name:  MOHAB MEDNICK  Date of Exam:   05/10/2022 Medical Rec #: [id number]         Accession #:    [id number] Date of Birth: 1966/03/17          Patient Gender: M Patient Age:   84 years Exam Location:  [facility] Procedure:      VAS US CAROTID Referring Phys: Oren Binet --------------------------------------------------------------------------------  Indications:       Multiple syncopal episodes. Risk Factors:      Hypertension, hyperlipidemia. Other Factors:     Chest pain, NASH, right facial droop that quickly resolved. Comparison Study:  No prior study on file Performing Technologist: Sharion Dove RVS  Examination Guidelines: A complete evaluation includes B-mode imaging, spectral Doppler, color Doppler, and power Doppler as needed of all accessible portions of each vessel. Bilateral testing is considered an integral part of a complete examination. Limited examinations for reoccurring indications may be performed as noted.  Right Carotid Findings: +----------+--------+--------+--------+------------------+------------------+           PSV cm/sEDV cm/sStenosisPlaque DescriptionComments           +----------+--------+--------+--------+------------------+------------------+ CCA Prox  90      16                                intimal thickening +----------+--------+--------+--------+------------------+------------------+ CCA Distal80      17                                intimal thickening +----------+--------+--------+--------+------------------+------------------+ ICA Prox  83  25      1-39%   calcific          Shadowing          +----------+--------+--------+--------+------------------+------------------+ ICA Mid   64      22                                                   +----------+--------+--------+--------+------------------+------------------+ ICA Distal69       28                                                   +----------+--------+--------+--------+------------------+------------------+ ECA       91      10                                                   +----------+--------+--------+--------+------------------+------------------+ +----------+--------+-------+--------+-------------------+           PSV cm/sEDV cmsDescribeArm Pressure (mmHG) +----------+--------+-------+--------+-------------------+ Subclavian102                                        +----------+--------+-------+--------+-------------------+ +---------+--------+--+--------+-+ VertebralPSV cm/s15EDV cm/s3 +---------+--------+--+--------+-+  Left Carotid Findings: +----------+--------+--------+--------+------------------+------------------+           PSV cm/sEDV cm/sStenosisPlaque DescriptionComments           +----------+--------+--------+--------+------------------+------------------+ CCA Prox  119     23                                intimal thickening +----------+--------+--------+--------+------------------+------------------+ CCA Distal100     20                                intimal thickening +----------+--------+--------+--------+------------------+------------------+ ICA Prox  59      20      1-39%   heterogenous      Shadowing          +----------+--------+--------+--------+------------------+------------------+ ICA Mid   65      26                                                   +----------+--------+--------+--------+------------------+------------------+ ICA Distal65      27                                                   +----------+--------+--------+--------+------------------+------------------+ ECA       110     17                                                   +----------+--------+--------+--------+------------------+------------------+ +----------+--------+--------+--------+-------------------+  PSV cm/sEDV cm/sDescribeArm Pressure (mmHG) +----------+--------+--------+--------+-------------------+ Subclavian131                                         +----------+--------+--------+--------+-------------------+ +---------+--------+--+--------+--+ VertebralPSV cm/s65EDV cm/s18 +---------+--------+--+--------+--+   Summary: Right Carotid: Velocities in the right ICA are consistent with a 1-39% stenosis. Left Carotid: Velocities in the left ICA are consistent with a 1-39% stenosis. Vertebrals:  Bilateral vertebral arteries demonstrate antegrade flow. Subclavians: Normal flow hemodynamics were seen in bilateral subclavian              arteries. *See table(s) above for measurements and observations.     Preliminary    MR BRAIN WO CONTRAST  Result Date: 05/10/2022 CLINICAL DATA:  TIA.  Syncope after night sweats. EXAM: MRI HEAD WITHOUT CONTRAST TECHNIQUE: Multiplanar, multiecho pulse sequences of the brain and surrounding structures were obtained without intravenous contrast. COMPARISON:  Head CT from earlier today FINDINGS: Brain: No acute infarction, hemorrhage, hydrocephalus, extra-axial collection or mass lesion. Few small FLAIR hyperintensities scattered in the cerebral white matter, usually related to chronic small vessel ischemia especially given patient's small-vessel risk factors. Vascular: Major flow voids are preserved Skull and upper cervical spine: Normal marrow signal Sinuses/Orbits: Negative IMPRESSION: 1. No acute or reversible finding. 2. Few remote white matter insults, usually chronic microvascular ischemia. Electronically Signed   By: Jorje Guild M.D.   On: 05/10/2022 07:58   CT HEAD WO CONTRAST  Result Date: 05/10/2022 CLINICAL DATA:  Recent syncopal episode, initial encounter EXAM: CT HEAD WITHOUT CONTRAST TECHNIQUE: Contiguous axial images were obtained from the base of the skull through the vertex without intravenous contrast. RADIATION DOSE REDUCTION:  This exam was performed according to the departmental dose-optimization program which includes automated exposure control, adjustment of the mA and/or kV according to patient size and/or use of iterative reconstruction technique. COMPARISON:  None Available. FINDINGS: Brain: No evidence of acute infarction, hemorrhage, hydrocephalus, extra-axial collection or mass lesion/mass effect. Vascular: No hyperdense vessel or unexpected calcification. Skull: Normal. Negative for fracture or focal lesion. Sinuses/Orbits: No acute finding. Other: None. IMPRESSION: No acute intracranial abnormality noted. Electronically Signed   By: Inez Catalina M.D.   On: 05/10/2022 01:24    EKG: SR possible old IMI no acute changes    ASSESSMENT AND PLAN:   CAD:  SSCP associated with diaphoresis and Pre syncope. Known very high calcium score 2076 over 4 years ago. ECG ? Old IMI Shared decision making favor diagnostic cath today Orders written lab contacted Continue statin , ASA, nitrates and beta blocker   Shared Decision Making/Informed Consent The risks [stroke (1 in 1000), death (1 in 1000), kidney failure [usually temporary] (1 in 500), bleeding (1 in 200), allergic reaction [possibly serious] (1 in 200)], benefits (diagnostic support and management of coronary artery disease) and alternatives of a cardiac catheterization were discussed in detail with Ms. Jeanell Sparrow and she is willing to proceed.   2.  Anxiety/Depression:  with bipolar needs closer psychiatric f/u Zoloft alone does not seem to be adequate  3.  HLD:  on zocor LDL 108 stressed compliance   4.  HTN:  Well controlled.  Continue current medications and low sodium Dash type diet.     SignedJenkins Rouge 05/10/2022, 10:43 AM

## 2022-05-10 NOTE — Progress Notes (Signed)
VASCULAR LAB    Carotid duplex has been performed.  See CV proc for preliminary results.   Santina Trillo, RVT 05/10/2022, 10:13 AM

## 2022-05-10 NOTE — H&P (View-Only) (Signed)
[address]       [place] [address]             [phone]        Prather T Dusek Shellman Medical Record [id number] Date of Birth: 11-11-1965  Referring: No ref. provider found Primary Care: Tower, Wynelle Fanny, MD Primary Cardiologist:Brian Stanford Breed, MD  Chief Complaint:    Chief Complaint  Patient presents with   Loss of Consciousness    History of Present Illness:     Glenn Stewart is a 57 year old male with a past medical history of HTN, HLD, NASH, Anxiety and Depression. He presented to the ED on 05/10/22 after calling 911 due to left facial numbness and droop, which resolved by the time the paramedics arrived. He states that 2 nights prior he awoke from his sleep diaphoretic and the following night he again awoke diaphoretic and believes he had a syncopal episode after waking up on the bathroom floor. Over the past couple weeks he has been having intermittent dull chest tightness that resolves on its own which has worsened over the past 2 days. He states he was changing his sheets when he became fatigued and experienced left facial numbness. Upond arrival to the ED his EKG showed no acute changes and his Troponin I (high sensitivity) was 17, 19, and 17. He was ruled out for NSTEMI or STEMI. His MRI brain showed no sign of stroke. Left sided facial droop has fully resolved, possible TIA. In 2019 he had a CT coronary calcium score of 2076, placing him in the 99th percentile. His follow up stress test was negative and he declined a left heart catheterization due to concerns over the cost. Left heart catheterization on 05/10/22 shows severe 3 vessel disease. The proximal to mid RCA is 100% stenosed, first marginal is 99% stenosed, mid circumflex to distal circumflex is 99% stenoses, ostial LAD to proximal LAD is 70% stenosed, mid LAD is 100% stenoses and the diagonal is 99% stenosed. His echocardiogram shows inferior basal hypokinesis and LVEF 50-55%. He currently has  no chest tightness or facial numbness.   He states he has anxiety and depression but has never been diagnosed with Bipolar disorder. He sees a psychiatrist once every 6 months. He is currently tearful. He lives alone and works at a [employer] ([employer]).  Current Activity/ Functional Status: Patient is independent with mobility/ambulation, transfers, ADL's, IADL's.   Zubrod Score: At the time of surgery this patient's most appropriate activity status/level should be described as: []$     0    Normal activity, no symptoms [x]$     1    Restricted in physical strenuous activity but ambulatory, able to do out light work []$     2    Ambulatory and capable of self care, unable to do work activities, up and about                 more than 50%  Of the time                            []$     3    Only limited self care, in bed greater than 50% of waking hours []$     4    Completely disabled, no self care, confined to bed or chair []$     5    Moribund  Past Medical History:  Diagnosis Date   ADHD (attention  deficit hyperactivity disorder)    Anxiety state, unspecified    Bipolar 1 disorder ([place])    CHEST PAIN 12/04/2008   Qualifier: Diagnosis of  By: Glori Bickers MD, Carmell [place]    Depressive disorder, not elsewhere classified    Dermatophytosis of nail    Esophageal reflux    HYPERGLYCEMIA 07/10/2007   Qualifier: Diagnosis of  By: Glori Bickers MD, Carmell [place]    Kidney stones 09/22/2010   Low back pain 09/18/2013   Other chronic nonalcoholic liver disease    Problems with hearing    Prostatitis, unspecified    Pure hypercholesterolemia    Screen for STD (sexually transmitted disease) 04/20/2011   Sprain of cruciate ligament of knee    Tear of medial cartilage or meniscus of knee, current    TMJ (temporomandibular joint disorder) 12/25/2010   Unspecified essential hypertension    Unspecified gastritis and gastroduodenitis without mention of hemorrhage    Urethritis, unspecified     Past Surgical History:  Procedure  Laterality Date   No prior surgery      Social History   Tobacco Use  Smoking Status Never  Smokeless Tobacco Never    Social History   Substance and Sexual Activity  Alcohol Use Yes   Comment: rare     Allergies  Allergen Reactions   Sulfonamide Derivatives     REACTION: hives    Current Facility-Administered Medications  Medication Dose Route Frequency Provider Last Rate Last Admin   0.9 %  sodium chloride infusion   Intravenous Continuous [name] D, MD       0.9 %  sodium chloride infusion  250 mL Intravenous PRN Burnell Blanks, MD       acetaminophen (TYLENOL) tablet 650 mg  650 mg Oral Q4H PRN Burnell Blanks, MD       ALPRAZolam Duanne Moron) tablet 1 mg  1 mg Oral Daily PRN Burnell Blanks, MD       amLODipine (NORVASC) tablet 10 mg  10 mg Oral Daily Burnell Blanks, MD   10 mg at 05/10/22 1129   aspirin chewable tablet 81 mg  81 mg Oral Daily Burnell Blanks, MD   81 mg at 05/10/22 1129   atorvastatin (LIPITOR) tablet 40 mg  40 mg Oral Daily Burnell Blanks, MD   40 mg at 05/10/22 1129   hydrALAZINE (APRESOLINE) injection 10 mg  10 mg Intravenous Q20 Min PRN Burnell Blanks, MD       isosorbide mononitrate (IMDUR) 24 hr tablet 30 mg  30 mg Oral Daily [name] D, MD   30 mg at 05/10/22 1128   labetalol (NORMODYNE) injection 10 mg  10 mg Intravenous Q10 min PRN Burnell Blanks, MD       metoprolol succinate (TOPROL-XL) 24 hr tablet 25 mg  25 mg Oral Daily Burnell Blanks, MD   25 mg at 05/10/22 1129   multivitamin with minerals tablet 1 tablet  1 tablet Oral Daily Burnell Blanks, MD   1 tablet at 05/10/22 1129   omega-3 acid ethyl esters (LOVAZA) capsule 2 g  2 g Oral Daily [name] D, MD   2 g at 05/10/22 1130   ondansetron (ZOFRAN) injection 4 mg  4 mg Intravenous Q6H PRN Burnell Blanks, MD       sertraline (ZOLOFT) tablet 200 mg  200 mg Oral  Daily Burnell Blanks, MD   200 mg at 05/10/22 1128   sodium chloride flush (NS)  0.9 % injection 3 mL  3 mL Intravenous Q12H Burnell Blanks, MD   3 mL at 05/10/22 1132   sodium chloride flush (NS) 0.9 % injection 3 mL  3 mL Intravenous Q12H Burnell Blanks, MD       sodium chloride flush (NS) 0.9 % injection 3 mL  3 mL Intravenous PRN Burnell Blanks, MD       tamsulosin (FLOMAX) capsule 0.4 mg  0.4 mg Oral Daily [name] D, MD   0.4 mg at 05/10/22 1128   traZODone (DESYREL) tablet 100 mg  100 mg Oral QHS PRN Burnell Blanks, MD        Medications Prior to Admission  Medication Sig Dispense Refill Last Dose   ALPRAZolam (XANAX) 1 MG tablet Take 1 mg by mouth Once daily as needed for anxiety.   05/09/2022 at 2230   amLODipine (NORVASC) 10 MG tablet TAKE 2 TABLETS BY MOUTH DAILY AS DIRECTED (Patient taking differently: Take 10 mg by mouth daily.) 180 tablet 3 05/09/2022 at am   aspirin 81 MG chewable tablet Chew 1 tablet (81 mg total) by mouth daily. 30 tablet 0 05/09/2022 at am   fish oil-omega-3 fatty acids 1000 MG capsule Take 2 g by mouth daily.   05/09/2022 at am   isosorbide mononitrate (IMDUR) 30 MG 24 hr tablet Take 1 tablet (30 mg total) by mouth daily. 30 tablet 1 05/09/2022 at am   metoprolol succinate (TOPROL-XL) 25 MG 24 hr tablet Take 1 tablet (25 mg total) by mouth daily. 30 tablet 1 05/09/2022 at 0930   Multiple Vitamin (MULTIVITAMIN) capsule Take 1 capsule by mouth daily.   05/09/2022   nitroGLYCERIN (NITROSTAT) 0.4 MG SL tablet Place 1 tablet (0.4 mg total) under the tongue every 5 (five) minutes as needed for chest pain. 30 tablet 3 05/09/2022   sertraline (ZOLOFT) 100 MG tablet Take 200 mg by mouth daily.   05/09/2022 at am   simvastatin (ZOCOR) 20 MG tablet Take 20 mg by mouth at bedtime.   05/09/2022 at pm   tamsulosin (FLOMAX) 0.4 MG CAPS capsule Take 0.4 mg by mouth daily.   05/09/2022 at am   traZODone (DESYREL) 50 MG tablet Take  100 mg by mouth at bedtime as needed for sleep.   05/09/2022 at pm    Family History  Problem Relation Age of Onset   Diabetes Father    Hypertension Father    Heart failure Father   Younger sister had a heart attack in her late 49s or early 50s   Review of Systems:   Review of Systems  Constitutional:  Positive for chills, diaphoresis and malaise/fatigue. Negative for fever.  HENT:  Negative for ear pain, hearing loss and tinnitus.        A couple years since his last dental visit  Eyes:  Negative for blurred vision and double vision.  Respiratory:  Negative for cough, shortness of breath and wheezing.   Cardiovascular:  Positive for chest pain and palpitations. Negative for orthopnea and leg swelling.  Gastrointestinal:  Positive for heartburn. Negative for vomiting.  Genitourinary:  Negative for dysuria.  Musculoskeletal:  Negative for myalgias.       No varicose veins  Skin:  Negative for rash.  Neurological:  Positive for loss of consciousness. Negative for dizziness and focal weakness.       Facial numbness yesterday, now resolved. Possible TIA. Believes he had brief LOC  Endo/Heme/Allergies:  Bruises/bleeds easily.  Psychiatric/Behavioral:  Positive  for depression. Negative for suicidal ideas. The patient is nervous/anxious.        Physical Exam: BP 136/86 (BP Location: Left Arm)   Pulse 76   Temp 98.3 F (36.8 C) (Oral)   Resp 16   Ht 5' 6"$  (1.676 m)   Wt 89.8 kg Comment: Pt weighed at home  SpO2 93%   BMI 31.96 kg/m   General appearance: alert, cooperative, and no distress Head: Normocephalic, without obvious abnormality, atraumatic Neck: no adenopathy, no carotid bruit, no JVD, supple, symmetrical, trachea midline, and thyroid not enlarged, symmetric, no tenderness/mass/nodules Lymph nodes: Cervical, supraclavicular, and axillary nodes normal. Resp: clear to auscultation bilaterally Cardio: regular rate and rhythm, S1, S2 normal, no murmur, click, rub or  gallop GI: soft, non-tender; bowel sounds normal; no masses,  no organomegaly Extremities: No edema, no varicose veins, DP/PT pulses 2+ Neurologic: Grossly normal. Alert and oriented X 3, normal strength and tone. No focal weakness or numbness.  Diagnostic Studies & Radiology Findings:     LEFT HEART CATH AND CORONARY ANGIOGRAPHY   Conclusion    Prox RCA to Mid RCA lesion is 100% stenosed.   1st Mrg lesion is 99% stenosed.   Mid Cx to Dist Cx lesion is 99% stenosed.   Ost LAD to Prox LAD lesion is 70% stenosed.   Mid LAD lesion is 100% stenosed.   1st Diag lesion is 99% stenosed.   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 Severe disease in a moderate caliber Diagonal branch that arises prior to the total LAD occlusion.  Large caliber Circumflex with diffuse severe distal stenosis leading into the most distal obtuse marginal branch. (Functional CTO). The first obtuse marginal branch is a moderate caliber vessel with severe ostial stenosis.  The RCA is a large dominant artery with chronic total occlusion of the mid vessel The distal vessel fills from right to right collaterals and left to right collaterals.   Dominance: Right  ECHOCARDIOGRAM REPORT    Patient Name:   Glenn Stewart Date of Exam: 05/10/2022  Medical Rec #:  [id number]        Height:       66.0 in  Accession #:    [id number]       Weight:       216.6 lb  Date of Birth:  1965-06-27         BSA:          2.069 m  Patient Age:    19 years         BP:           148/100 mmHg  Patient Gender: M                HR:           61 bpm.  Exam Location:  Inpatient   Procedure: 2D Echo, Color Doppler and Cardiac Doppler   Indications:    TIA    History:        Patient has no prior history of Echocardiogram  examinations.                 CAD; Risk Factors:Hypertension and Dyslipidemia.    Sonographer:    Raquel Sarna Senior RDCS  Referring Phys:  [place]    1. Inferior basal hypokinesis . Left ventricular ejection fraction, by  estimation, is 50 to 55%.  The left ventricle has low normal function. The  left ventricle demonstrates regional wall motion abnormalities (see  scoring diagram/findings for  description). Left ventricular diastolic parameters were normal.   2. Right ventricular systolic function is normal. The right ventricular  size is normal.   3. Left atrial size was mildly dilated.   4. The mitral valve is abnormal. Trivial mitral valve regurgitation. No  evidence of mitral stenosis.   5. The aortic valve is tricuspid. There is mild calcification of the  aortic valve. Aortic valve regurgitation is not visualized. Aortic valve  sclerosis is present, with no evidence of aortic valve stenosis.   6. The inferior vena cava is normal in size with greater than 50%  respiratory variability, suggesting right atrial pressure of 3 mmHg.   FINDINGS   Left Ventricle: Inferior basal hypokinesis. Left ventricular ejection  fraction, by estimation, is 50 to 55%. The left ventricle has low normal  function. The left ventricle demonstrates regional wall motion  abnormalities. The left ventricular internal  cavity size was normal in size. There is no left ventricular hypertrophy.  Left ventricular diastolic parameters were normal.   Right Ventricle: The right ventricular size is normal. No increase in  right ventricular wall thickness. Right ventricular systolic function is  normal.   Left Atrium: Left atrial size was mildly dilated.   Right Atrium: Right atrial size was normal in size.   Pericardium: There is no evidence of pericardial effusion.   Mitral Valve: The mitral valve is abnormal. There is mild thickening of  the mitral valve leaflet(s). Trivial mitral valve regurgitation. No  evidence of mitral valve stenosis.   Tricuspid Valve: The tricuspid valve is normal in structure. Tricuspid   valve regurgitation is not demonstrated. No evidence of tricuspid  stenosis.   Aortic Valve: The aortic valve is tricuspid. There is mild calcification  of the aortic valve. Aortic valve regurgitation is not visualized. Aortic  valve sclerosis is present, with no evidence of aortic valve stenosis.   Pulmonic Valve: The pulmonic valve was normal in structure. Pulmonic valve  regurgitation is not visualized. No evidence of pulmonic stenosis.   Aorta: The aortic root is normal in size and structure.   Venous: The inferior vena cava is normal in size with greater than 50%  respiratory variability, suggesting right atrial pressure of 3 mmHg.   IAS/Shunts: No atrial level shunt detected by color flow Doppler.     LEFT VENTRICLE  PLAX 2D  LVIDd:         5.70 cm      Diastology  LVIDs:         4.30 cm      LV e' medial:    7.72 cm/s  LV PW:         0.80 cm      LV E/e' medial:  9.2  LV IVS:        0.90 cm      LV e' lateral:   13.20 cm/s  LVOT diam:     2.30 cm      LV E/e' lateral: 5.4  LV SV:         80  LV SV Index:   39  LVOT Area:     4.15 cm    LV Volumes (MOD)  LV vol d, MOD A2C: 120.0 ml  LV vol d, MOD A4C: 116.0 ml  LV vol s, MOD A2C: 51.8 ml  LV vol s, MOD A4C: 60.1 ml  LV  SV MOD A2C:     68.2 ml  LV SV MOD A4C:     116.0 ml  LV SV MOD BP:      61.7 ml   RIGHT VENTRICLE  RV S prime:     14.60 cm/s  TAPSE (M-mode): 1.7 cm   LEFT ATRIUM             Index        RIGHT ATRIUM           Index  LA diam:        3.90 cm 1.89 cm/m   RA Area:     15.80 cm  LA Vol (A2C):   51.7 ml 24.99 ml/m  RA Volume:   33.40 ml  16.15 ml/m  LA Vol (A4C):   42.7 ml 20.64 ml/m  LA Biplane Vol: 48.2 ml 23.30 ml/m   AORTIC VALVE  LVOT Vmax:   90.20 cm/s  LVOT Vmean:  67.800 cm/s  LVOT VTI:    0.193 m    AORTA  Ao Root diam: 3.50 cm  Ao Asc diam:  3.50 cm   MITRAL VALVE  MV Area (PHT): 3.33 cm    SHUNTS  MV Decel Time: 228 msec    Systemic VTI:  0.19 m  MV E velocity: 71.30  cm/s  Systemic Diam: 2.30 cm  MV A velocity: 67.10 cm/s  MV E/A ratio:  1.06   Jenkins Rouge MD  Electronically signed by Jenkins Rouge MD  Signature Date/Time: 05/10/2022/11:34:55 AM      Final     Assessment and Plan:  CAD: Severe 3 vessel CAD. Patient willing to undergo CABG surgery, but tearful and anxious. Lives alone but states his sister may be able to help him after surgery. Dr. Roxan Hockey to determine candidacy and timing for CABG surgery.   Anxiety/Depression: Sertraline and Xanax PRN, visits a psychiatrist every 6 months. Will need closer outpatient follow up.   HTN: Well controlled. Norvasc, Toprol XL and Imdur  HLD: Simvastatin  Magdalene River, PA-C 05/10/22  I have seen and examined Glenn Stewart, reviewed records, echo report and cath images.  57 yo man with known CAD presented after syncopal event associated with left facial numbness.  Workup negative for stroke.  Cath shows severe 3 vessel CAD with preserved LV systolic function.  CABG indicated for survival benefit and relief of symptoms.  I discussed the general nature of the procedure, including the need for general anesthesia, the incisions to be used, the use of cardiopulmonary bypass, and the use of drainage tubes and pacing wires postoperatively with Glenn Stewart.  We discussed the expected hospital stay, overall recovery and short and long term outcomes.  I informed him of the indications, risks, benefits and alternatives.  He understands the risks include, but are not limited to death, stroke, MI, DVT/PE, bleeding, possible need for transfusion, infections, cardiac arrhythmias, as well as other organ system dysfunction including respiratory, renal, or GI complications.   Normal Allen's test left arm- will plan to use left radial.  He accepts the risks and agrees to proceed.   For CABG tomorrow.  Revonda Standard Roxan Hockey, MD [facility] [phone]

## 2022-05-10 NOTE — ED Notes (Signed)
Per ED Charge, Cath Lab is en route to get Pt.

## 2022-05-10 NOTE — ED Notes (Signed)
Introduced self to patient. Put on monitor. Labs sent. Swallow study done.

## 2022-05-10 NOTE — Assessment & Plan Note (Signed)
Report of facial droop symptoms that have resolved at current time. Will get MRI brain to r/o stroke Full stroke work up if positive.

## 2022-05-10 NOTE — Progress Notes (Signed)
ANTICOAGULATION CONSULT NOTE - Initial Consult  Pharmacy Consult for heparin Indication: chest pain/ACS  Allergies  Allergen Reactions   Sulfonamide Derivatives     REACTION: hives    Patient Measurements: Height: 5' 6"$  (167.6 cm) Weight: 89.8 kg (198 lb) (Pt weighed at home) IBW/kg (Calculated) : 63.8 Heparin Dosing Weight: 83kg  Vital Signs: Temp: 98 F (36.7 C) (02/19 1604) Temp Source: Oral (02/19 1604) BP: 156/94 (02/19 1604) Pulse Rate: 80 (02/19 1604)  Labs: Recent Labs    05/09/22 1802 05/10/22 0122 05/10/22 0350 05/10/22 0505  HGB 16.2  --   --   --   HCT 45.4  --   --   --   PLT 259  --   --   --   APTT  --   --  29  --   LABPROT  --   --  13.5  --   INR  --   --  1.0  --   CREATININE 0.94  --   --   --   TROPONINIHS  --  17 19* 17    Estimated Creatinine Clearance: 91 mL/min (by C-G formula based on SCr of 0.94 mg/dL).   Medical History: Past Medical History:  Diagnosis Date   ADHD (attention deficit hyperactivity disorder)    Anxiety state, unspecified    Bipolar 1 disorder ([place])    CHEST PAIN 12/04/2008   Qualifier: Diagnosis of  By: Glori Bickers MD, Carmell [place]    Depressive disorder, not elsewhere classified    Dermatophytosis of nail    Esophageal reflux    HYPERGLYCEMIA 07/10/2007   Qualifier: Diagnosis of  By: Glori Bickers MD, Carmell [place]    Kidney stones 09/22/2010   Low back pain 09/18/2013   Other chronic nonalcoholic liver disease    Problems with hearing    Prostatitis, unspecified    Pure hypercholesterolemia    Screen for STD (sexually transmitted disease) 04/20/2011   Sprain of cruciate ligament of knee    Tear of medial cartilage or meniscus of knee, current    TMJ (temporomandibular joint disorder) 12/25/2010   Unspecified essential hypertension    Unspecified gastritis and gastroduodenitis without mention of hemorrhage    Urethritis, unspecified     Medications:  Medications Prior to Admission  Medication Sig Dispense Refill Last Dose    ALPRAZolam (XANAX) 1 MG tablet Take 1 mg by mouth Once daily as needed for anxiety.   05/09/2022 at 2230   amLODipine (NORVASC) 10 MG tablet TAKE 2 TABLETS BY MOUTH DAILY AS DIRECTED (Patient taking differently: Take 10 mg by mouth daily.) 180 tablet 3 05/09/2022 at am   aspirin 81 MG chewable tablet Chew 1 tablet (81 mg total) by mouth daily. 30 tablet 0 05/09/2022 at am   fish oil-omega-3 fatty acids 1000 MG capsule Take 2 g by mouth daily.   05/09/2022 at am   isosorbide mononitrate (IMDUR) 30 MG 24 hr tablet Take 1 tablet (30 mg total) by mouth daily. 30 tablet 1 05/09/2022 at am   metoprolol succinate (TOPROL-XL) 25 MG 24 hr tablet Take 1 tablet (25 mg total) by mouth daily. 30 tablet 1 05/09/2022 at 0930   Multiple Vitamin (MULTIVITAMIN) capsule Take 1 capsule by mouth daily.   05/09/2022   nitroGLYCERIN (NITROSTAT) 0.4 MG SL tablet Place 1 tablet (0.4 mg total) under the tongue every 5 (five) minutes as needed for chest pain. 30 tablet 3 05/09/2022   sertraline (ZOLOFT) 100 MG tablet Take 200 mg by mouth  daily.   05/09/2022 at am   simvastatin (ZOCOR) 20 MG tablet Take 20 mg by mouth at bedtime.   05/09/2022 at pm   tamsulosin (FLOMAX) 0.4 MG CAPS capsule Take 0.4 mg by mouth daily.   05/09/2022 at am   traZODone (DESYREL) 50 MG tablet Take 100 mg by mouth at bedtime as needed for sleep.   05/09/2022 at pm   Scheduled:   amLODipine  10 mg Oral Daily   aspirin  81 mg Oral Daily   atorvastatin  40 mg Oral Daily   isosorbide mononitrate  30 mg Oral Daily   metoprolol succinate  25 mg Oral Daily   multivitamin with minerals  1 tablet Oral Daily   omega-3 acid ethyl esters  2 g Oral Daily   sertraline  200 mg Oral Daily   sodium chloride flush  3 mL Intravenous Q12H   sodium chloride flush  3 mL Intravenous Q12H   tamsulosin  0.4 mg Oral Daily   Infusions:   sodium chloride 50 mL/hr at 05/10/22 1600   sodium chloride     heparin      Assessment: Pt is s/p cath with multivessels dz. Plan for  CABG consult. Ok to start heparin 2 hr post radial band removal ~1700.  Scr <1 Hgb/plt wnl  Goal of Therapy:  Heparin level 0.3-0.7 units/ml Monitor platelets by anticoagulation protocol: Yes   Plan:  Heparin 1200 units/hr - 7 PM Check 6 hr HL Daily HL and CBC F/u CABG  Onnie Boer, PharmD, BCIDP, AAHIVP, CPP Infectious Disease Pharmacist 05/10/2022 5:26 PM

## 2022-05-10 NOTE — Consult Note (Signed)
CARDIOLOGY CONSULT NOTE       Patient ID: Glenn Stewart MRN: [id number] DOB/AGE: May 06, 1965 57 y.o.  Admit date: 05/09/2022 Referring Physician: Sloan Leiter Primary Physician: Tower, Wynelle Fanny, MD Primary Cardiologist: Stanford Breed Reason for Consultation: Chest pain  Principal Problem:   Chest pain, rule out acute myocardial infarction Active Problems:   Dyslipidemia, goal LDL below 70   Essential hypertension   Facial droop   HPI:  57 y.o. with history of anxiety/depression bipolar HLD and HTN Previously seen by Dr Stanford Breed for SSCP  Calcium score done 09/01/17 was 2076,  99 th percentile Patient deferred stress testing/cath due to insurance reasons Not clear how compliant he has been with meds. Admitted with SSCP diaphoresis and ? TIA with left facial droop now resolved MRI head negative Carotid with plaque no stenosis ECG with no acute changes and troponin negative x 2.  His anxiety is not well Rx He has seen a psychologist but currently only every 6 months Stresses about elderly mom in [place], dysfunctional family and financial issues   ROS All other systems reviewed and negative except as noted above  Past Medical History:  Diagnosis Date   ADHD (attention deficit hyperactivity disorder)    Anxiety state, unspecified    Bipolar 1 disorder ([place])    CHEST PAIN 12/04/2008   Qualifier: Diagnosis of  By: Glori Bickers MD, Carmell [place]    Depressive disorder, not elsewhere classified    Dermatophytosis of nail    Esophageal reflux    HYPERGLYCEMIA 07/10/2007   Qualifier: Diagnosis of  By: Glori Bickers MD, Carmell Austria    Kidney stones 09/22/2010   Low back pain 09/18/2013   Other chronic nonalcoholic liver disease    Problems with hearing    Prostatitis, unspecified    Pure hypercholesterolemia    Screen for STD (sexually transmitted disease) 04/20/2011   Sprain of cruciate ligament of knee    Tear of medial cartilage or meniscus of knee, current    TMJ (temporomandibular joint disorder) 12/25/2010    Unspecified essential hypertension    Unspecified gastritis and gastroduodenitis without mention of hemorrhage    Urethritis, unspecified     Family History  Problem Relation Age of Onset   Diabetes Father    Hypertension Father    Heart failure Father     Social History   Socioeconomic History   Marital status: Single    Spouse name: Not on file   Number of children: 0   Years of education: Not on file   Highest education level: Not on file  Occupational History   Occupation: Owns motorcycle shop  Tobacco Use   Smoking status: Never   Smokeless tobacco: Never  Vaping Use   Vaping Use: Never used  Substance and Sexual Activity   Alcohol use: Yes    Comment: rare   Drug use: No   Sexual activity: Not on file  Other Topics Concern   Not on file  Social History Narrative   Not on file   Social Determinants of Health   Financial Resource Strain: Not on file  Food Insecurity: Not on file  Transportation Needs: Not on file  Physical Activity: Not on file  Stress: Not on file  Social Connections: Not on file  Intimate Partner Violence: Not on file    Past Surgical History:  Procedure Laterality Date   No prior surgery        Current Facility-Administered Medications:    acetaminophen (TYLENOL) tablet 650 mg, 650 mg, Oral,  Q4H PRN, Etta Quill, DO   ALPRAZolam Duanne Moron) tablet 1 mg, 1 mg, Oral, Daily PRN, Alcario Drought, Jared M, DO   amLODipine (NORVASC) tablet 10 mg, 10 mg, Oral, Daily, Alcario Drought, Jared M, DO   aspirin chewable tablet 81 mg, 81 mg, Oral, Daily, Alcario Drought, Jared M, DO   atorvastatin (LIPITOR) tablet 40 mg, 40 mg, Oral, Daily, Ghimire, Henreitta Leber, MD   enoxaparin (LOVENOX) injection 40 mg, 40 mg, Subcutaneous, Q24H, Alcario Drought, Jared M, DO   isosorbide mononitrate (IMDUR) 24 hr tablet 30 mg, 30 mg, Oral, Daily, Alcario Drought, Jared M, DO   metoprolol succinate (TOPROL-XL) 24 hr tablet 25 mg, 25 mg, Oral, Daily, Alcario Drought, Jared M, DO   multivitamin with minerals  tablet 1 tablet, 1 tablet, Oral, Daily, Alcario Drought, Jared M, DO   omega-3 acid ethyl esters (LOVAZA) capsule 2 g, 2 g, Oral, Daily, Alcario Drought, Jared M, DO   ondansetron [facility]) injection 4 mg, 4 mg, Intravenous, Q6H PRN, Alcario Drought, Jared M, DO   sertraline (ZOLOFT) tablet 200 mg, 200 mg, Oral, Daily, Alcario Drought, Jared M, DO   tamsulosin [facility]) capsule 0.4 mg, 0.4 mg, Oral, Daily, Alcario Drought, Jared M, DO   traZODone (DESYREL) tablet 100 mg, 100 mg, Oral, QHS PRN, Etta Quill, DO  Current Outpatient Medications:    ALPRAZolam (XANAX) 1 MG tablet, Take 1 mg by mouth Once daily as needed for anxiety., Disp: , Rfl:    amLODipine (NORVASC) 10 MG tablet, TAKE 2 TABLETS BY MOUTH DAILY AS DIRECTED (Patient taking differently: Take 10 mg by mouth daily.), Disp: 180 tablet, Rfl: 3   aspirin 81 MG chewable tablet, Chew 1 tablet (81 mg total) by mouth daily., Disp: 30 tablet, Rfl: 0   fish oil-omega-3 fatty acids 1000 MG capsule, Take 2 g by mouth daily., Disp: , Rfl:    isosorbide mononitrate (IMDUR) 30 MG 24 hr tablet, Take 1 tablet (30 mg total) by mouth daily., Disp: 30 tablet, Rfl: 1   metoprolol succinate (TOPROL-XL) 25 MG 24 hr tablet, Take 1 tablet (25 mg total) by mouth daily., Disp: 30 tablet, Rfl: 1   Multiple Vitamin (MULTIVITAMIN) capsule, Take 1 capsule by mouth daily., Disp: , Rfl:    nitroGLYCERIN (NITROSTAT) 0.4 MG SL tablet, Place 1 tablet (0.4 mg total) under the tongue every 5 (five) minutes as needed for chest pain., Disp: 30 tablet, Rfl: 3   sertraline (ZOLOFT) 100 MG tablet, Take 200 mg by mouth daily., Disp: , Rfl:    simvastatin (ZOCOR) 20 MG tablet, Take 20 mg by mouth at bedtime., Disp: , Rfl:    tamsulosin (FLOMAX) 0.4 MG CAPS capsule, Take 0.4 mg by mouth daily., Disp: , Rfl:    traZODone (DESYREL) 50 MG tablet, Take 100 mg by mouth at bedtime as needed for sleep., Disp: , Rfl:   amLODipine  10 mg Oral Daily   aspirin  81 mg Oral Daily   atorvastatin  40 mg Oral Daily   enoxaparin  (LOVENOX) injection  40 mg Subcutaneous Q24H   isosorbide mononitrate  30 mg Oral Daily   metoprolol succinate  25 mg Oral Daily   multivitamin with minerals  1 tablet Oral Daily   omega-3 acid ethyl esters  2 g Oral Daily   sertraline  200 mg Oral Daily   tamsulosin  0.4 mg Oral Daily     Physical Exam: Blood pressure (!) 148/100, pulse 62, temperature 98 F (36.7 C), temperature source Oral, resp. rate 18, SpO2 96 %.    Affect  appropriate Healthy:  appears stated age 14: normal Neck supple with no adenopathy JVP normal no bruits no thyromegaly Lungs clear with no wheezing and good diaphragmatic motion Heart:  S1/S2 no murmur, no rub, gallop or click PMI normal Abdomen: benighn, BS positve, no tenderness, no AAA no bruit.  No HSM or HJR Distal pulses intact with no bruits No edema Neuro non-focal Skin warm and dry No muscular weakness   Labs:   Lab Results  Component Value Date   WBC 6.5 05/09/2022   HGB 16.2 05/09/2022   HCT 45.4 05/09/2022   MCV 80.2 05/09/2022   PLT 259 05/09/2022    Recent Labs  Lab 05/09/22 1802  NA 137  K 3.6  CL 105  CO2 19*  BUN 15  CREATININE 0.94  CALCIUM 9.6  GLUCOSE 112*   No results found for: "CKTOTAL", "CKMB", "CKMBINDEX", "TROPONINI"  Lab Results  Component Value Date   CHOL 182 05/10/2022   CHOL 140 04/03/2020   CHOL 200 09/18/2013   Lab Results  Component Value Date   HDL 38 (L) 05/10/2022   HDL 39.70 04/03/2020   HDL 46.70 09/18/2013   Lab Results  Component Value Date   LDLCALC 108 (H) 05/10/2022   LDLCALC 72 04/03/2020   LDLCALC 90 09/18/2013   Lab Results  Component Value Date   TRIG 180 (H) 05/10/2022   TRIG 145.0 04/03/2020   TRIG 315.0 (H) 09/18/2013   Lab Results  Component Value Date   CHOLHDL 4.8 05/10/2022   CHOLHDL 4 04/03/2020   CHOLHDL 4 09/18/2013   Lab Results  Component Value Date   LDLDIRECT 117.5 04/10/2012   LDLDIRECT 125.9 07/22/2010   LDLDIRECT 168.3 10/29/2009       Radiology: VAS US CAROTID  Result Date: 05/10/2022 Carotid Arterial Duplex Study Patient Name:  AREK OLIVAR  Date of Exam:   05/10/2022 Medical Rec #: [id number]         Accession #:    [id number] Date of Birth: Oct 31, 1965          Patient Gender: M Patient Age:   78 years Exam Location:  [facility] Procedure:      VAS US CAROTID Referring Phys: Oren Binet --------------------------------------------------------------------------------  Indications:       Multiple syncopal episodes. Risk Factors:      Hypertension, hyperlipidemia. Other Factors:     Chest pain, NASH, right facial droop that quickly resolved. Comparison Study:  No prior study on file Performing Technologist: Sharion Dove RVS  Examination Guidelines: A complete evaluation includes B-mode imaging, spectral Doppler, color Doppler, and power Doppler as needed of all accessible portions of each vessel. Bilateral testing is considered an integral part of a complete examination. Limited examinations for reoccurring indications may be performed as noted.  Right Carotid Findings: +----------+--------+--------+--------+------------------+------------------+           PSV cm/sEDV cm/sStenosisPlaque DescriptionComments           +----------+--------+--------+--------+------------------+------------------+ CCA Prox  90      16                                intimal thickening +----------+--------+--------+--------+------------------+------------------+ CCA Distal80      17                                intimal thickening +----------+--------+--------+--------+------------------+------------------+ ICA Prox  83  25      1-39%   calcific          Shadowing          +----------+--------+--------+--------+------------------+------------------+ ICA Mid   64      22                                                   +----------+--------+--------+--------+------------------+------------------+ ICA Distal69       28                                                   +----------+--------+--------+--------+------------------+------------------+ ECA       91      10                                                   +----------+--------+--------+--------+------------------+------------------+ +----------+--------+-------+--------+-------------------+           PSV cm/sEDV cmsDescribeArm Pressure (mmHG) +----------+--------+-------+--------+-------------------+ Subclavian102                                        +----------+--------+-------+--------+-------------------+ +---------+--------+--+--------+-+ VertebralPSV cm/s15EDV cm/s3 +---------+--------+--+--------+-+  Left Carotid Findings: +----------+--------+--------+--------+------------------+------------------+           PSV cm/sEDV cm/sStenosisPlaque DescriptionComments           +----------+--------+--------+--------+------------------+------------------+ CCA Prox  119     23                                intimal thickening +----------+--------+--------+--------+------------------+------------------+ CCA Distal100     20                                intimal thickening +----------+--------+--------+--------+------------------+------------------+ ICA Prox  59      20      1-39%   heterogenous      Shadowing          +----------+--------+--------+--------+------------------+------------------+ ICA Mid   65      26                                                   +----------+--------+--------+--------+------------------+------------------+ ICA Distal65      27                                                   +----------+--------+--------+--------+------------------+------------------+ ECA       110     17                                                   +----------+--------+--------+--------+------------------+------------------+ +----------+--------+--------+--------+-------------------+  PSV cm/sEDV cm/sDescribeArm Pressure (mmHG) +----------+--------+--------+--------+-------------------+ Subclavian131                                         +----------+--------+--------+--------+-------------------+ +---------+--------+--+--------+--+ VertebralPSV cm/s65EDV cm/s18 +---------+--------+--+--------+--+   Summary: Right Carotid: Velocities in the right ICA are consistent with a 1-39% stenosis. Left Carotid: Velocities in the left ICA are consistent with a 1-39% stenosis. Vertebrals:  Bilateral vertebral arteries demonstrate antegrade flow. Subclavians: Normal flow hemodynamics were seen in bilateral subclavian              arteries. *See table(s) above for measurements and observations.     Preliminary    MR BRAIN WO CONTRAST  Result Date: 05/10/2022 CLINICAL DATA:  TIA.  Syncope after night sweats. EXAM: MRI HEAD WITHOUT CONTRAST TECHNIQUE: Multiplanar, multiecho pulse sequences of the brain and surrounding structures were obtained without intravenous contrast. COMPARISON:  Head CT from earlier today FINDINGS: Brain: No acute infarction, hemorrhage, hydrocephalus, extra-axial collection or mass lesion. Few small FLAIR hyperintensities scattered in the cerebral white matter, usually related to chronic small vessel ischemia especially given patient's small-vessel risk factors. Vascular: Major flow voids are preserved Skull and upper cervical spine: Normal marrow signal Sinuses/Orbits: Negative IMPRESSION: 1. No acute or reversible finding. 2. Few remote white matter insults, usually chronic microvascular ischemia. Electronically Signed   By: Jorje Guild M.D.   On: 05/10/2022 07:58   CT HEAD WO CONTRAST  Result Date: 05/10/2022 CLINICAL DATA:  Recent syncopal episode, initial encounter EXAM: CT HEAD WITHOUT CONTRAST TECHNIQUE: Contiguous axial images were obtained from the base of the skull through the vertex without intravenous contrast. RADIATION DOSE REDUCTION:  This exam was performed according to the departmental dose-optimization program which includes automated exposure control, adjustment of the mA and/or kV according to patient size and/or use of iterative reconstruction technique. COMPARISON:  None Available. FINDINGS: Brain: No evidence of acute infarction, hemorrhage, hydrocephalus, extra-axial collection or mass lesion/mass effect. Vascular: No hyperdense vessel or unexpected calcification. Skull: Normal. Negative for fracture or focal lesion. Sinuses/Orbits: No acute finding. Other: None. IMPRESSION: No acute intracranial abnormality noted. Electronically Signed   By: Inez Catalina M.D.   On: 05/10/2022 01:24    EKG: SR possible old IMI no acute changes    ASSESSMENT AND PLAN:   CAD:  SSCP associated with diaphoresis and Pre syncope. Known very high calcium score 2076 over 4 years ago. ECG ? Old IMI Shared decision making favor diagnostic cath today Orders written lab contacted Continue statin , ASA, nitrates and beta blocker   Shared Decision Making/Informed Consent The risks [stroke (1 in 1000), death (1 in 1000), kidney failure [usually temporary] (1 in 500), bleeding (1 in 200), allergic reaction [possibly serious] (1 in 200)], benefits (diagnostic support and management of coronary artery disease) and alternatives of a cardiac catheterization were discussed in detail with Ms. Jeanell Sparrow and she is willing to proceed.   2.  Anxiety/Depression:  with bipolar needs closer psychiatric f/u Zoloft alone does not seem to be adequate  3.  HLD:  on zocor LDL 108 stressed compliance   4.  HTN:  Well controlled.  Continue current medications and low sodium Dash type diet.     SignedJenkins Rouge 05/10/2022, 10:43 AM

## 2022-05-11 ENCOUNTER — Inpatient Hospital Stay (HOSPITAL_COMMUNITY): Payer: Medicaid Other

## 2022-05-11 ENCOUNTER — Encounter (HOSPITAL_COMMUNITY): Payer: Self-pay | Admitting: Cardiovascular Disease

## 2022-05-11 DIAGNOSIS — I200 Unstable angina: Secondary | ICD-10-CM

## 2022-05-11 DIAGNOSIS — G459 Transient cerebral ischemic attack, unspecified: Secondary | ICD-10-CM

## 2022-05-11 DIAGNOSIS — Z01810 Encounter for preprocedural cardiovascular examination: Secondary | ICD-10-CM

## 2022-05-11 LAB — URINALYSIS, ROUTINE W REFLEX MICROSCOPIC
Bilirubin Urine: NEGATIVE
Glucose, UA: NEGATIVE mg/dL
Hgb urine dipstick: NEGATIVE
Ketones, ur: NEGATIVE mg/dL
Leukocytes,Ua: NEGATIVE
Nitrite: NEGATIVE
Protein, ur: NEGATIVE mg/dL
Specific Gravity, Urine: 1.018 (ref 1.005–1.030)
pH: 5 (ref 5.0–8.0)

## 2022-05-11 LAB — BASIC METABOLIC PANEL
Anion gap: 11 (ref 5–15)
BUN: 13 mg/dL (ref 6–20)
CO2: 23 mmol/L (ref 22–32)
Calcium: 9.3 mg/dL (ref 8.9–10.3)
Chloride: 104 mmol/L (ref 98–111)
Creatinine, Ser: 1.02 mg/dL (ref 0.61–1.24)
GFR, Estimated: >60 mL/min (ref >60)
Glucose, Bld: 95 mg/dL (ref 70–99)
Potassium: 3.4 mmol/L — ABNORMAL LOW (ref 3.5–5.1)
Sodium: 138 mmol/L (ref 135–145)

## 2022-05-11 LAB — HEPATIC FUNCTION PANEL
ALT: 26 U/L (ref 0–44)
AST: 23 U/L (ref 15–41)
Albumin: 4.3 g/dL (ref 3.5–5.0)
Alkaline Phosphatase: 51 U/L (ref 38–126)
Bilirubin, Direct: 0.2 mg/dL (ref 0.0–0.2)
Indirect Bilirubin: 1.1 mg/dL — ABNORMAL HIGH (ref 0.3–0.9)
Total Bilirubin: 1.3 mg/dL — ABNORMAL HIGH (ref 0.3–1.2)
Total Protein: 7.4 g/dL (ref 6.5–8.1)

## 2022-05-11 LAB — CBC
HCT: 44.4 % (ref 39.0–52.0)
Hemoglobin: 15.8 g/dL (ref 13.0–17.0)
MCH: 28.6 pg (ref 26.0–34.0)
MCHC: 35.6 g/dL (ref 30.0–36.0)
MCV: 80.4 fL (ref 80.0–100.0)
Platelets: 233 10*3/uL (ref 150–400)
RBC: 5.52 MIL/uL (ref 4.22–5.81)
RDW: 12.3 % (ref 11.5–15.5)
WBC: 7.9 10*3/uL (ref 4.0–10.5)
nRBC: 0 % (ref 0.0–0.2)

## 2022-05-11 LAB — VAS US DOPPLER PRE CABG
Left ABI: 1.3
Right ABI: 1.28

## 2022-05-11 LAB — TYPE AND SCREEN
ABO/RH(D): B POS
Antibody Screen: NEGATIVE

## 2022-05-11 LAB — MRSA NEXT GEN BY PCR, NASAL: MRSA by PCR Next Gen: NOT DETECTED

## 2022-05-11 LAB — HEPARIN LEVEL (UNFRACTIONATED)
Heparin Unfractionated: 0.23 IU/mL — ABNORMAL LOW (ref 0.30–0.70)
Heparin Unfractionated: 0.34 IU/mL (ref 0.30–0.70)

## 2022-05-11 LAB — ABO/RH: ABO/RH(D): B POS

## 2022-05-11 MED ORDER — TRANEXAMIC ACID (OHS) PUMP PRIME SOLUTION
2.0000 mg/kg | INTRAVENOUS | Status: DC
  Filled 2022-05-11: qty 1.8

## 2022-05-11 MED ORDER — METOPROLOL TARTRATE 12.5 MG HALF TABLET
12.5000 mg | ORAL_TABLET | Freq: Once | ORAL | Status: AC
  Administered 2022-05-12: 12.5 mg via ORAL
  Filled 2022-05-11: qty 1

## 2022-05-11 MED ORDER — DEXMEDETOMIDINE HCL IN NACL 400 MCG/100ML IV SOLN
0.1000 ug/kg/h | INTRAVENOUS | Status: AC
  Administered 2022-05-12: .4 ug/kg/h via INTRAVENOUS
  Filled 2022-05-11: qty 100

## 2022-05-11 MED ORDER — INSULIN REGULAR(HUMAN) IN NACL 100-0.9 UT/100ML-% IV SOLN
INTRAVENOUS | Status: AC
  Administered 2022-05-12: 2.2 [IU]/h via INTRAVENOUS
  Filled 2022-05-11: qty 100

## 2022-05-11 MED ORDER — HEPARIN 30,000 UNITS/1000 ML (OHS) CELLSAVER SOLUTION
Status: DC
  Filled 2022-05-11: qty 1000

## 2022-05-11 MED ORDER — EPINEPHRINE HCL 5 MG/250ML IV SOLN IN NS
0.0000 ug/min | INTRAVENOUS | Status: DC
  Filled 2022-05-11: qty 250

## 2022-05-11 MED ORDER — NOREPINEPHRINE 4 MG/250ML-% IV SOLN
0.0000 ug/min | INTRAVENOUS | Status: DC
  Filled 2022-05-11: qty 250

## 2022-05-11 MED ORDER — TRANEXAMIC ACID 1000 MG/10ML IV SOLN
1.5000 mg/kg/h | INTRAVENOUS | Status: AC
  Administered 2022-05-12: 1.5 mg/kg/h via INTRAVENOUS
  Filled 2022-05-11: qty 25

## 2022-05-11 MED ORDER — MILRINONE LACTATE IN DEXTROSE 20-5 MG/100ML-% IV SOLN
0.3000 ug/kg/min | INTRAVENOUS | Status: DC
  Filled 2022-05-11: qty 100

## 2022-05-11 MED ORDER — CEFAZOLIN SODIUM-DEXTROSE 2-4 GM/100ML-% IV SOLN
2.0000 g | INTRAVENOUS | Status: AC
  Administered 2022-05-12: 2 g via INTRAVENOUS
  Filled 2022-05-11: qty 100

## 2022-05-11 MED ORDER — MAGNESIUM SULFATE 50 % IJ SOLN
40.0000 meq | INTRAMUSCULAR | Status: DC
  Filled 2022-05-11: qty 9.85

## 2022-05-11 MED ORDER — PLASMA-LYTE A IV SOLN
INTRAVENOUS | Status: DC
  Filled 2022-05-11: qty 2.5

## 2022-05-11 MED ORDER — TRANEXAMIC ACID (OHS) BOLUS VIA INFUSION
15.0000 mg/kg | INTRAVENOUS | Status: AC
  Administered 2022-05-12: 1347 mg via INTRAVENOUS
  Filled 2022-05-11: qty 1347

## 2022-05-11 MED ORDER — POTASSIUM CHLORIDE CRYS ER 20 MEQ PO TBCR
40.0000 meq | EXTENDED_RELEASE_TABLET | Freq: Once | ORAL | Status: AC
  Administered 2022-05-11: 40 meq via ORAL
  Filled 2022-05-11: qty 2

## 2022-05-11 MED ORDER — BISACODYL 5 MG PO TBEC
5.0000 mg | DELAYED_RELEASE_TABLET | Freq: Once | ORAL | Status: AC
  Administered 2022-05-11: 5 mg via ORAL
  Filled 2022-05-11: qty 1

## 2022-05-11 MED ORDER — CHLORHEXIDINE GLUCONATE 4 % EX LIQD
60.0000 mL | Freq: Once | CUTANEOUS | Status: AC
  Administered 2022-05-12: 4 via TOPICAL
  Filled 2022-05-11: qty 60

## 2022-05-11 MED ORDER — CHLORHEXIDINE GLUCONATE 4 % EX LIQD
60.0000 mL | Freq: Once | CUTANEOUS | Status: AC
  Administered 2022-05-11: 4 via TOPICAL
  Filled 2022-05-11: qty 60

## 2022-05-11 MED ORDER — CHLORHEXIDINE GLUCONATE 0.12 % MT SOLN
15.0000 mL | Freq: Once | OROMUCOSAL | Status: AC
  Administered 2022-05-12: 15 mL via OROMUCOSAL
  Filled 2022-05-11: qty 15

## 2022-05-11 MED ORDER — PHENYLEPHRINE HCL-NACL 20-0.9 MG/250ML-% IV SOLN
30.0000 ug/min | INTRAVENOUS | Status: AC
  Administered 2022-05-12: 20 ug/min via INTRAVENOUS
  Filled 2022-05-11: qty 250

## 2022-05-11 MED ORDER — NITROGLYCERIN IN D5W 200-5 MCG/ML-% IV SOLN
2.0000 ug/min | INTRAVENOUS | Status: AC
  Administered 2022-05-12: 5 ug/min via INTRAVENOUS
  Filled 2022-05-11: qty 250

## 2022-05-11 MED ORDER — POTASSIUM CHLORIDE 2 MEQ/ML IV SOLN
80.0000 meq | INTRAVENOUS | Status: DC
  Filled 2022-05-11: qty 40

## 2022-05-11 MED ORDER — DIAZEPAM 5 MG PO TABS
5.0000 mg | ORAL_TABLET | Freq: Once | ORAL | Status: AC
  Administered 2022-05-12: 5 mg via ORAL
  Filled 2022-05-11: qty 1

## 2022-05-11 MED ORDER — VANCOMYCIN HCL 1500 MG/300ML IV SOLN
1500.0000 mg | INTRAVENOUS | Status: AC
  Administered 2022-05-12: 1500 mg via INTRAVENOUS
  Filled 2022-05-11: qty 300

## 2022-05-11 NOTE — Progress Notes (Signed)
Cardiologist:  Stanford Breed  Subjective:  Denies SSCP, palpitations or Dyspnea   Objective:  Vitals:   05/10/22 1900 05/10/22 2328 05/11/22 0341 05/11/22 0813  BP: (!) 145/86 (!) 140/89 117/77 (!) 141/90  Pulse: 72 65 68 (!) 59  Resp: 20 20 15 19  $ Temp: 98.8 F (37.1 C) 98.3 F (36.8 C) 98.1 F (36.7 C) 97.7 F (36.5 C)  TempSrc: Oral Oral Oral Oral  SpO2: 93% 92% 94% 95%  Weight:      Height:        Intake/Output from previous day:  Intake/Output Summary (Last 24 hours) at 05/11/2022 0818 Last data filed at 05/11/2022 [id number] Gross per 24 hour  Intake 119.35 ml  Output 1260 ml  Net -1140.65 ml    Physical Exam: Affect appropriate Healthy:  appears stated age HEENT: normal Neck supple with no adenopathy JVP normal no bruits no thyromegaly Lungs clear with no wheezing and good diaphragmatic motion Heart:  S1/S2 no murmur, no rub, gallop or click PMI normal Abdomen: benighn, BS positve, no tenderness, no AAA no bruit.  No HSM or HJR Distal pulses intact with no bruits No edema Neuro non-focal Skin warm and dry No muscular weakness   Lab Results: Basic Metabolic Panel: Recent Labs    05/09/22 1802 05/11/22 0231  NA 137 138  K 3.6 3.4*  CL 105 104  CO2 19* 23  GLUCOSE 112* 95  BUN 15 13  CREATININE 0.94 1.02  CALCIUM 9.6 9.3   Liver Function Tests: No results for input(s): "AST", "ALT", "ALKPHOS", "BILITOT", "PROT", "ALBUMIN" in the last 72 hours. No results for input(s): "LIPASE", "AMYLASE" in the last 72 hours. CBC: Recent Labs    05/09/22 1802 05/11/22 0231  WBC 6.5 7.9  HGB 16.2 15.8  HCT 45.4 44.4  MCV 80.2 80.4  PLT 259 233   Cardiac Enzymes: No results for input(s): "CKTOTAL", "CKMB", "CKMBINDEX", "TROPONINI" in the last 72 hours. BNP: Invalid input(s): "POCBNP" D-Dimer: No results for input(s): "DDIMER" in the last 72 hours. Hemoglobin A1C: Recent Labs    05/10/22 1136  HGBA1C 5.4   Fasting Lipid Panel: Recent Labs     05/10/22 0505  CHOL 182  HDL 38*  LDLCALC 108*  TRIG 180*  CHOLHDL 4.8   Thyroid Function Tests: No results for input(s): "TSH", "T4TOTAL", "T3FREE", "THYROIDAB" in the last 72 hours.  Invalid input(s): "FREET3" Anemia Panel: No results for input(s): "VITAMINB12", "FOLATE", "FERRITIN", "TIBC", "IRON", "RETICCTPCT" in the last 72 hours.  Imaging: CARDIAC CATHETERIZATION  Result Date: 05/10/2022   Prox RCA to Mid RCA lesion is 100% stenosed.   1st Mrg lesion is 99% stenosed.   Mid Cx to Dist Cx lesion is 99% stenosed.   Ost LAD to Prox LAD lesion is 70% stenosed.   Mid LAD lesion is 100% stenosed.   1st Diag lesion is 99% stenosed. 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 Severe disease in a moderate caliber Diagonal branch that arises prior to the total LAD occlusion. Large caliber Circumflex with diffuse severe distal stenosis leading into the most distal obtuse marginal branch. (Functional CTO). The first obtuse marginal branch is a moderate caliber vessel with severe ostial stenosis. The RCA is a large dominant artery with chronic total occlusion of the mid vessel The distal vessel fills from right to right collaterals and left to right collaterals. Recommendations; He has severe three vessel CAD with chronic  occlusion of the mid RCA, mid LAD and distal Circumflex with severe disease in the early obtuse marginal branch and in the diagonal branch. Bypass surgery appears to be the best method for revascularization. Will ask CT surgery to see him today to discuss bypass. Will resume IV heparin 2 hours post sheath pull. Continue ASA, statin and beta blocker.   ECHOCARDIOGRAM COMPLETE  Result Date: 05/10/2022    ECHOCARDIOGRAM REPORT   Patient Name:   Glenn Stewart Date of Exam: 05/10/2022 Medical Rec #:  [id number]        Height:       66.0 in Accession #:    [id number]       Weight:       216.6 lb Date of  Birth:  [birth year]         BSA:          2.069 m Patient Age:    57 years         BP:           148/100 mmHg Patient Gender: M                HR:           61 bpm. Exam Location:  Inpatient Procedure: 2D Echo, Color Doppler and Cardiac Doppler Indications:    TIA  History:        Patient has no prior history of Echocardiogram examinations.                 CAD; Risk Factors:Hypertension and Dyslipidemia.  Sonographer:    Raquel Sarna Senior RDCS Referring Phys: [place]  1. Inferior basal hypokinesis . Left ventricular ejection fraction, by estimation, is 50 to 55%. The left ventricle has low normal function. The left ventricle demonstrates regional wall motion abnormalities (see scoring diagram/findings for description). Left ventricular diastolic parameters were normal.  2. Right ventricular systolic function is normal. The right ventricular size is normal.  3. Left atrial size was mildly dilated.  4. The mitral valve is abnormal. Trivial mitral valve regurgitation. No evidence of mitral stenosis.  5. The aortic valve is tricuspid. There is mild calcification of the aortic valve. Aortic valve regurgitation is not visualized. Aortic valve sclerosis is present, with no evidence of aortic valve stenosis.  6. The inferior vena cava is normal in size with greater than 50% respiratory variability, suggesting right atrial pressure of 3 mmHg. FINDINGS  Left Ventricle: Inferior basal hypokinesis. Left ventricular ejection fraction, by estimation, is 50 to 55%. The left ventricle has low normal function. The left ventricle demonstrates regional wall motion abnormalities. The left ventricular internal cavity size was normal in size. There is no left ventricular hypertrophy. Left ventricular diastolic parameters were normal. Right Ventricle: The right ventricular size is normal. No increase in right ventricular wall thickness. Right ventricular systolic function is normal. Left Atrium: Left atrial size was  mildly dilated. Right Atrium: Right atrial size was normal in size. Pericardium: There is no evidence of pericardial effusion. Mitral Valve: The mitral valve is abnormal. There is mild thickening of the mitral valve leaflet(s). Trivial mitral valve regurgitation. No evidence of mitral valve stenosis. Tricuspid Valve: The tricuspid valve is normal in structure. Tricuspid valve regurgitation is not demonstrated. No evidence of tricuspid stenosis. Aortic Valve: The aortic valve is tricuspid. There is mild calcification of the aortic valve. Aortic valve regurgitation is not visualized. Aortic valve sclerosis is present, with no evidence of aortic valve stenosis. Pulmonic  Valve: The pulmonic valve was normal in structure. Pulmonic valve regurgitation is not visualized. No evidence of pulmonic stenosis. Aorta: The aortic root is normal in size and structure. Venous: The inferior vena cava is normal in size with greater than 50% respiratory variability, suggesting right atrial pressure of 3 mmHg. IAS/Shunts: No atrial level shunt detected by color flow Doppler.  LEFT VENTRICLE PLAX 2D LVIDd:         5.70 cm      Diastology LVIDs:         4.30 cm      LV e' medial:    7.72 cm/s LV PW:         0.80 cm      LV E/e' medial:  9.2 LV IVS:        0.90 cm      LV e' lateral:   13.20 cm/s LVOT diam:     2.30 cm      LV E/e' lateral: 5.4 LV SV:         80 LV SV Index:   39 LVOT Area:     4.15 cm  LV Volumes (MOD) LV vol d, MOD A2C: 120.0 ml LV vol d, MOD A4C: 116.0 ml LV vol s, MOD A2C: 51.8 ml LV vol s, MOD A4C: 60.1 ml LV SV MOD A2C:     68.2 ml LV SV MOD A4C:     116.0 ml LV SV MOD BP:      61.7 ml RIGHT VENTRICLE RV S prime:     14.60 cm/s TAPSE (M-mode): 1.7 cm LEFT ATRIUM             Index        RIGHT ATRIUM           Index LA diam:        3.90 cm 1.89 cm/m   RA Area:     15.80 cm LA Vol (A2C):   51.7 ml 24.99 ml/m  RA Volume:   33.40 ml  16.15 ml/m LA Vol (A4C):   42.7 ml 20.64 ml/m LA Biplane Vol: 48.2 ml 23.30 ml/m   AORTIC VALVE LVOT Vmax:   90.20 cm/s LVOT Vmean:  67.800 cm/s LVOT VTI:    0.193 m  AORTA Ao Root diam: 3.50 cm Ao Asc diam:  3.50 cm MITRAL VALVE MV Area (PHT): 3.33 cm    SHUNTS MV Decel Time: 228 msec    Systemic VTI:  0.19 m MV E velocity: 71.30 cm/s  Systemic Diam: 2.30 cm MV A velocity: 67.10 cm/s MV E/A ratio:  1.06 Jenkins Rouge MD Electronically signed by Jenkins Rouge MD Signature Date/Time: 05/10/2022/11:34:55 AM    Final    VAS US CAROTID  Result Date: 05/10/2022 Carotid Arterial Duplex Study Patient Name:  DASHAN KINLOCK  Date of Exam:   05/10/2022 Medical Rec #: [id number]         Accession #:    [id number] Date of Birth: 08/11/1965          Patient Gender: M Patient Age:   1 years Exam Location:  [facility] Procedure:      VAS US CAROTID Referring Phys: Oren Binet --------------------------------------------------------------------------------  Indications:       Multiple syncopal episodes. Risk Factors:      Hypertension, hyperlipidemia. Other Factors:     Chest pain, NASH, right facial droop that quickly resolved. Comparison Study:  No prior study on file Performing Technologist: Sharion Dove RVS  Examination Guidelines: A  complete evaluation includes B-mode imaging, spectral Doppler, color Doppler, and power Doppler as needed of all accessible portions of each vessel. Bilateral testing is considered an integral part of a complete examination. Limited examinations for reoccurring indications may be performed as noted.  Right Carotid Findings: +----------+--------+--------+--------+------------------+------------------+           PSV cm/sEDV cm/sStenosisPlaque DescriptionComments           +----------+--------+--------+--------+------------------+------------------+ CCA Prox  90      16                                intimal thickening +----------+--------+--------+--------+------------------+------------------+ CCA Distal80      17                                 intimal thickening +----------+--------+--------+--------+------------------+------------------+ ICA Prox  83      25      1-39%   calcific          Shadowing          +----------+--------+--------+--------+------------------+------------------+ ICA Mid   64      22                                                   +----------+--------+--------+--------+------------------+------------------+ ICA Distal69      28                                                   +----------+--------+--------+--------+------------------+------------------+ ECA       91      10                                                   +----------+--------+--------+--------+------------------+------------------+ +----------+--------+-------+--------+-------------------+           PSV cm/sEDV cmsDescribeArm Pressure (mmHG) +----------+--------+-------+--------+-------------------+ Subclavian102                                        +----------+--------+-------+--------+-------------------+ +---------+--------+--+--------+-+ VertebralPSV cm/s15EDV cm/s3 +---------+--------+--+--------+-+  Left Carotid Findings: +----------+--------+--------+--------+------------------+------------------+           PSV cm/sEDV cm/sStenosisPlaque DescriptionComments           +----------+--------+--------+--------+------------------+------------------+ CCA Prox  119     23                                intimal thickening +----------+--------+--------+--------+------------------+------------------+ CCA Distal100     20                                intimal thickening +----------+--------+--------+--------+------------------+------------------+ ICA Prox  59      20      1-39%   heterogenous      Shadowing          +----------+--------+--------+--------+------------------+------------------+ ICA Mid  65      26                                                    +----------+--------+--------+--------+------------------+------------------+ ICA Distal65      27                                                   +----------+--------+--------+--------+------------------+------------------+ ECA       110     17                                                   +----------+--------+--------+--------+------------------+------------------+ +----------+--------+--------+--------+-------------------+           PSV cm/sEDV cm/sDescribeArm Pressure (mmHG) +----------+--------+--------+--------+-------------------+ Subclavian131                                         +----------+--------+--------+--------+-------------------+ +---------+--------+--+--------+--+ VertebralPSV cm/s65EDV cm/s18 +---------+--------+--+--------+--+   Summary: Right Carotid: Velocities in the right ICA are consistent with a 1-39% stenosis. Left Carotid: Velocities in the left ICA are consistent with a 1-39% stenosis. Vertebrals:  Bilateral vertebral arteries demonstrate antegrade flow. Subclavians: Normal flow hemodynamics were seen in bilateral subclavian              arteries. *See table(s) above for measurements and observations.  Electronically signed by Servando Snare MD on 05/10/2022 at 10:46:18 AM.    Final    MR BRAIN WO CONTRAST  Result Date: 05/10/2022 CLINICAL DATA:  TIA.  Syncope after night sweats. EXAM: MRI HEAD WITHOUT CONTRAST TECHNIQUE: Multiplanar, multiecho pulse sequences of the brain and surrounding structures were obtained without intravenous contrast. COMPARISON:  Head CT from earlier today FINDINGS: Brain: No acute infarction, hemorrhage, hydrocephalus, extra-axial collection or mass lesion. Few small FLAIR hyperintensities scattered in the cerebral white matter, usually related to chronic small vessel ischemia especially given patient's small-vessel risk factors. Vascular: Major flow voids are preserved Skull and upper cervical spine: Normal marrow  signal Sinuses/Orbits: Negative IMPRESSION: 1. No acute or reversible finding. 2. Few remote white matter insults, usually chronic microvascular ischemia. Electronically Signed   By: Jorje Guild M.D.   On: 05/10/2022 07:58   CT HEAD WO CONTRAST  Result Date: 05/10/2022 CLINICAL DATA:  Recent syncopal episode, initial encounter EXAM: CT HEAD WITHOUT CONTRAST TECHNIQUE: Contiguous axial images were obtained from the base of the skull through the vertex without intravenous contrast. RADIATION DOSE REDUCTION: This exam was performed according to the departmental dose-optimization program which includes automated exposure control, adjustment of the mA and/or kV according to patient size and/or use of iterative reconstruction technique. COMPARISON:  None Available. FINDINGS: Brain: No evidence of acute infarction, hemorrhage, hydrocephalus, extra-axial collection or mass lesion/mass effect. Vascular: No hyperdense vessel or unexpected calcification. Skull: Normal. Negative for fracture or focal lesion. Sinuses/Orbits: No acute finding. Other: None. IMPRESSION: No acute intracranial abnormality noted. Electronically Signed   By: Inez Catalina M.D.   On: 05/10/2022 01:24    Cardiac Studies:  ECG: NSR ? Old IMI    Telemetry:  NSR   Echo: EF 50-55%   Medications:    amLODipine  10 mg Oral Daily   aspirin  81 mg Oral Daily   atorvastatin  40 mg Oral Daily   isosorbide mononitrate  30 mg Oral Daily   metoprolol succinate  25 mg Oral Daily   multivitamin with minerals  1 tablet Oral Daily   omega-3 acid ethyl esters  2 g Oral Daily   potassium chloride  40 mEq Oral Once   sertraline  200 mg Oral Daily   sodium chloride flush  3 mL Intravenous Q12H   sodium chloride flush  3 mL Intravenous Q12H   tamsulosin  0.4 mg Oral Daily      sodium chloride     heparin 1,350 Units/hr (05/11/22 0452)    Assessment/Plan:   CAD:  cath with severe 3 vessel CAD Essentially CTO of LAD/RCA and high grade LCX dx  Good distal vessels for bypass. Carotids with plaque no stenosis Continue heparin, beta blocker statin and nitrates CVTS to see CABG to be done this admission Patient is left handed ? Use RIMA/LIMA and right radial conduits given young age   Jenkins Rouge 05/11/2022, 8:18 AM

## 2022-05-11 NOTE — Progress Notes (Signed)
Pre- CABG vascular study completed.   Please see CV Procedures for preliminary results.  Zina Pitzer, RVT  11:58 AM 05/11/22

## 2022-05-11 NOTE — Progress Notes (Signed)
PROGRESS NOTE        PATIENT DETAILS Name: Glenn Stewart Age: 57 y.o. Sex: male Date of Birth: 11-12-1965 Admit Date: 05/09/2022 Admitting Physician Evalee Mutton Kristeen Mans, MD [id number], Wynelle Fanny, MD  Brief Summary: Patient is a 57 y.o.  male with history of HTN, HLD, NASH prior coronary calcium score in the 99th percentile in 2019-who presented with unstable angina, syncope and transient left-sided facial numbness.  He was evaluated by cardiology underwent LHC which showed triple-vessel disease-he has been evaluated by CVTS for CABG.  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 Severe disease in a moderate caliber Diagonal branch that arises prior to the total LAD occlusion.  Large caliber Circumflex with diffuse severe distal stenosis leading into the most distal obtuse marginal branch. (Functional CTO). The first obtuse marginal branch is a moderate caliber vessel with severe ostial stenosis.  The RCA is a large dominant artery with chronic total occlusion of the mid vessel The distal vessel fills from right to right collaterals and left to right collaterals.   Consults: Cardiology CVTS  Subjective: Some occasional left-sided chest pain overnight.  No further facial numbness.  Objective: Vitals: Blood pressure (!) 141/90, pulse (!) 59, temperature 97.7 F (36.5 C), temperature source Oral, resp. rate 19, height 5' 6"$  (1.676 m), weight 89.8 kg, SpO2 95 %.   Exam: Gen Exam:Alert awake-not in any distress HEENT:atraumatic,  normocephalic Chest: B/L clear to auscultation anteriorly CVS:S1S2 regular Abdomen:soft non tender, non distended Extremities:no edema Neurology: Non focal Skin: no rash  Pertinent Labs/Radiology:    Latest Ref Rng & Units 05/11/2022    2:31 AM 05/09/2022    6:02 PM 09/29/2020    7:48 PM  CBC  WBC 4.0 - 10.5 K/uL 7.9  6.5  10.7   Hemoglobin 13.0 - 17.0 g/dL 15.8  16.2  13.8   Hematocrit 39.0 - 52.0 % 44.4  45.4  41.9   Platelets 150 - 400 K/uL 233  259  279     Lab Results  Component Value Date   NA 138 05/11/2022   K 3.4 (L) 05/11/2022   CL 104 05/11/2022   CO2 23 05/11/2022      Assessment/Plan: Unstable angina Severe triple-vessel disease on LHC 2/20 Some intermittent chest pain overnight but otherwise stable Continue IV heparin/aspirin/beta-blocker/statin/nitrates Being evaluated by CVTS for CABG.  Left Facial numbness ?TIA or related to angina/chest pain Resoved-no further episodes since admission MRI brain negative for CVA Workup as above Continue aspirin/statin.   HTN Stable Continue amlodipine/beta-blocker/nitrate  HLD Switched to Lipitor 40 mg daily  Mood disorder Anxiety disorder Somewhat given need for CABG-reassurance provided.   Continue Zoloft and as needed Xanax  BPH Flomax  Obesity: Estimated body mass index is 31.96 kg/m as calculated from the following:   Height as of this encounter: 5' 6"$  (1.676 m).   Weight as of this encounter: 89.8 kg.   Code status:   Code Status: Full Code   DVT Prophylaxis:  Family Communication: None at bedside   Disposition Plan: Status is: Inpatient given severity of illness.   Planned Discharge Destination:Home   Diet: Diet Order             Diet Heart Room service appropriate? Yes; Fluid consistency: Thin  Diet effective now                     Antimicrobial agents: Anti-infectives (From admission, onward)    None        MEDICATIONS: Scheduled Meds:  amLODipine  10 mg  Oral Daily   aspirin  81 mg Oral Daily   atorvastatin  40 mg Oral Daily   isosorbide mononitrate  30 mg Oral Daily   metoprolol succinate  25 mg Oral Daily   multivitamin with minerals  1 tablet Oral Daily   omega-3 acid ethyl esters  2 g Oral Daily   sertraline  200 mg Oral Daily   sodium chloride flush  3 mL Intravenous Q12H   tamsulosin  0.4 mg Oral Daily   Continuous Infusions:  sodium chloride     heparin 1,350 Units/hr (05/11/22 0452)   PRN Meds:.sodium chloride, acetaminophen, ALPRAZolam, ondansetron (ZOFRAN) IV, sodium chloride flush, traZODone   I have personally reviewed following labs and imaging studies  LABORATORY DATA: CBC: Recent Labs  Lab 05/09/22 1802 05/11/22 0231  WBC 6.5 7.9  HGB 16.2 15.8  HCT 45.4 44.4  MCV 80.2 80.4  PLT 259 233     Basic Metabolic Panel: Recent Labs  Lab 05/09/22 1802 05/11/22 0231  NA 137 138  K 3.6 3.4*  CL 105 104  CO2 19* 23  GLUCOSE 112* 95  BUN 15 13  CREATININE 0.94 1.02  CALCIUM 9.6 9.3     GFR: Estimated Creatinine Clearance: 83.9 mL/min (by C-G formula based on SCr of 1.02 mg/dL).  Liver Function Tests: No results for input(s): "AST", "ALT", "ALKPHOS", "BILITOT", "PROT", "ALBUMIN" in the last 168 hours. No results for input(s): "LIPASE", "AMYLASE" in the last 168 hours. No results for input(s): "AMMONIA" in the last 168 hours.  Coagulation Profile: Recent Labs  Lab 05/10/22 0350  INR 1.0     Cardiac Enzymes: No results for input(s): "CKTOTAL", "CKMB", "CKMBINDEX", "TROPONINI" in the last 168 hours.  BNP (last 3 results) No results for input(s): "PROBNP" in the last 8760 hours.  Lipid Profile: Recent Labs    05/10/22 0505  CHOL 182  HDL 38*  LDLCALC 108*  TRIG 180*  CHOLHDL 4.8     Thyroid Function Tests: No results for input(s): "TSH", "T4TOTAL", "FREET4", "T3FREE", "THYROIDAB" in the last 72 hours.  Anemia Panel: No results for input(s): "VITAMINB12", "FOLATE", "FERRITIN",  "TIBC", "IRON", "RETICCTPCT" in the last 72 hours.  Urine analysis:    Component Value Date/Time   COLORURINE YELLOW 03/20/2020 1950   APPEARANCEUR CLEAR 03/20/2020 1950   LABSPEC 1.016 03/20/2020 1950   PHURINE 5.0 03/20/2020 1950   GLUCOSEU NEGATIVE 03/20/2020 1950   HGBUR SMALL (A) 03/20/2020 1950   HGBUR negative 01/18/2009 0000   BILIRUBINUR Negative 04/03/2020 1109   KETONESUR NEGATIVE 03/20/2020 1950   PROTEINUR Positive (A) 04/03/2020 1109   PROTEINUR NEGATIVE 03/20/2020 1950   UROBILINOGEN 0.2 04/03/2020 1109   UROBILINOGEN 0.2 01/07/2014 0616   NITRITE Negative 04/03/2020 1109   NITRITE NEGATIVE 03/20/2020 1950   LEUKOCYTESUR Negative 04/03/2020 1109   LEUKOCYTESUR NEGATIVE 03/20/2020 1950    Sepsis Labs: Lactic Acid, Venous    Component Value Date/Time   LATICACIDVEN  0.9 09/29/2020 1945    MICROBIOLOGY: No results found for this or any previous visit (from the past 240 hour(s)).  RADIOLOGY STUDIES/RESULTS: CARDIAC CATHETERIZATION  Result Date: 05/10/2022   Prox RCA to Mid RCA lesion is 100% stenosed.   1st Mrg lesion is 99% stenosed.   Mid Cx to Dist Cx lesion is 99% stenosed.   Ost LAD to Prox LAD lesion is 70% stenosed.   Mid LAD lesion is 100% stenosed.   1st Diag lesion is 99% stenosed. 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 Severe disease in a moderate caliber Diagonal branch that arises prior to the total LAD occlusion. Large caliber Circumflex with diffuse severe distal stenosis leading into the most distal obtuse marginal branch. (Functional CTO). The first obtuse marginal branch is a moderate caliber vessel with severe ostial stenosis. The RCA is a large dominant artery with chronic total occlusion of the mid vessel The distal vessel fills from right to right collaterals and left to right collaterals. Recommendations; He has severe three vessel CAD with chronic occlusion of  the mid RCA, mid LAD and distal Circumflex with severe disease in the early obtuse marginal branch and in the diagonal branch. Bypass surgery appears to be the best method for revascularization. Will ask CT surgery to see him today to discuss bypass. Will resume IV heparin 2 hours post sheath pull. Continue ASA, statin and beta blocker.   ECHOCARDIOGRAM COMPLETE  Result Date: 05/10/2022    ECHOCARDIOGRAM REPORT   Patient Name:   Glenn Stewart Date of Exam: 05/10/2022 Medical Rec #:  [id number]        Height:       66.0 in Accession #:    [id number]       Weight:       216.6 lb Date of Birth:  02/20/1966         BSA:          2.069 m Patient Age:    34 years         BP:           148/100 mmHg Patient Gender: M                HR:           61 bpm. Exam Location:  Inpatient Procedure: 2D Echo, Color Doppler and Cardiac Doppler Indications:    TIA  History:        Patient has no prior history of Echocardiogram examinations.                 CAD; Risk Factors:Hypertension and Dyslipidemia.  Sonographer:    Raquel Sarna Senior RDCS Referring Phys: [place]  1. Inferior basal hypokinesis . Left ventricular ejection fraction, by estimation, is 50 to 55%. The left ventricle has low normal function. The left ventricle demonstrates regional wall motion abnormalities (see scoring diagram/findings for description). Left ventricular diastolic parameters were normal.  2. Right ventricular systolic function is normal. The right ventricular size is normal.  3. Left atrial size was mildly dilated.  4. The mitral valve is abnormal. Trivial mitral valve regurgitation. No evidence of mitral stenosis.  5. The aortic valve is tricuspid. There is mild calcification of the aortic valve. Aortic valve regurgitation is not visualized. Aortic valve sclerosis is present, with no evidence of aortic valve stenosis.  6. The inferior vena cava is normal in  size with greater than 50% respiratory variability, suggesting right  atrial pressure of 3 mmHg. FINDINGS  Left Ventricle: Inferior basal hypokinesis. Left ventricular ejection fraction, by estimation, is 50 to 55%. The left ventricle has low normal function. The left ventricle demonstrates regional wall motion abnormalities. The left ventricular internal cavity size was normal in size. There is no left ventricular hypertrophy. Left ventricular diastolic parameters were normal. Right Ventricle: The right ventricular size is normal. No increase in right ventricular wall thickness. Right ventricular systolic function is normal. Left Atrium: Left atrial size was mildly dilated. Right Atrium: Right atrial size was normal in size. Pericardium: There is no evidence of pericardial effusion. Mitral Valve: The mitral valve is abnormal. There is mild thickening of the mitral valve leaflet(s). Trivial mitral valve regurgitation. No evidence of mitral valve stenosis. Tricuspid Valve: The tricuspid valve is normal in structure. Tricuspid valve regurgitation is not demonstrated. No evidence of tricuspid stenosis. Aortic Valve: The aortic valve is tricuspid. There is mild calcification of the aortic valve. Aortic valve regurgitation is not visualized. Aortic valve sclerosis is present, with no evidence of aortic valve stenosis. Pulmonic Valve: The pulmonic valve was normal in structure. Pulmonic valve regurgitation is not visualized. No evidence of pulmonic stenosis. Aorta: The aortic root is normal in size and structure. Venous: The inferior vena cava is normal in size with greater than 50% respiratory variability, suggesting right atrial pressure of 3 mmHg. IAS/Shunts: No atrial level shunt detected by color flow Doppler.  LEFT VENTRICLE PLAX 2D LVIDd:         5.70 cm      Diastology LVIDs:         4.30 cm      LV e' medial:    7.72 cm/s LV PW:         0.80 cm      LV E/e' medial:  9.2 LV IVS:        0.90 cm      LV e' lateral:   13.20 cm/s LVOT diam:     2.30 cm      LV E/e' lateral: 5.4 LV SV:          80 LV SV Index:   39 LVOT Area:     4.15 cm  LV Volumes (MOD) LV vol d, MOD A2C: 120.0 ml LV vol d, MOD A4C: 116.0 ml LV vol s, MOD A2C: 51.8 ml LV vol s, MOD A4C: 60.1 ml LV SV MOD A2C:     68.2 ml LV SV MOD A4C:     116.0 ml LV SV MOD BP:      61.7 ml RIGHT VENTRICLE RV S prime:     14.60 cm/s TAPSE (M-mode): 1.7 cm LEFT ATRIUM             Index        RIGHT ATRIUM           Index LA diam:        3.90 cm 1.89 cm/m   RA Area:     15.80 cm LA Vol (A2C):   51.7 ml 24.99 ml/m  RA Volume:   33.40 ml  16.15 ml/m LA Vol (A4C):   42.7 ml 20.64 ml/m LA Biplane Vol: 48.2 ml 23.30 ml/m  AORTIC VALVE LVOT Vmax:   90.20 cm/s LVOT Vmean:  67.800 cm/s LVOT VTI:    0.193 m  AORTA Ao Root diam: 3.50 cm Ao Asc diam:  3.50 cm MITRAL VALVE MV Area (PHT):  3.33 cm    SHUNTS MV Decel Time: 228 msec    Systemic VTI:  0.19 m MV E velocity: 71.30 cm/s  Systemic Diam: 2.30 cm MV A velocity: 67.10 cm/s MV E/A ratio:  1.06 Jenkins Rouge MD Electronically signed by Jenkins Rouge MD Signature Date/Time: 05/10/2022/11:34:55 AM    Final    VAS US CAROTID  Result Date: 05/10/2022 Carotid Arterial Duplex Study Patient Name:  Glenn Stewart  Date of Exam:   05/10/2022 Medical Rec #: [id number]         Accession #:    [id number] Date of Birth: 1966/02/20          Patient Gender: M Patient Age:   15 years Exam Location:  [facility] Procedure:      VAS US CAROTID Referring Phys: Oren Binet --------------------------------------------------------------------------------  Indications:       Multiple syncopal episodes. Risk Factors:      Hypertension, hyperlipidemia. Other Factors:     Chest pain, NASH, right facial droop that quickly resolved. Comparison Study:  No prior study on file Performing Technologist: Sharion Dove RVS  Examination Guidelines: A complete evaluation includes B-mode imaging, spectral Doppler, color Doppler, and power Doppler as needed of all accessible portions of each vessel. Bilateral testing is  considered an integral part of a complete examination. Limited examinations for reoccurring indications may be performed as noted.  Right Carotid Findings: +----------+--------+--------+--------+------------------+------------------+           PSV cm/sEDV cm/sStenosisPlaque DescriptionComments           +----------+--------+--------+--------+------------------+------------------+ CCA Prox  90      16                                intimal thickening +----------+--------+--------+--------+------------------+------------------+ CCA Distal80      17                                intimal thickening +----------+--------+--------+--------+------------------+------------------+ ICA Prox  83      25      1-39%   calcific          Shadowing          +----------+--------+--------+--------+------------------+------------------+ ICA Mid   64      22                                                   +----------+--------+--------+--------+------------------+------------------+ ICA Distal69      28                                                   +----------+--------+--------+--------+------------------+------------------+ ECA       91      10                                                   +----------+--------+--------+--------+------------------+------------------+ +----------+--------+-------+--------+-------------------+           PSV cm/sEDV cmsDescribeArm Pressure (mmHG) +----------+--------+-------+--------+-------------------+ Subclavian102                                        +----------+--------+-------+--------+-------------------+ +---------+--------+--+--------+-+  VertebralPSV cm/s15EDV cm/s3 +---------+--------+--+--------+-+  Left Carotid Findings: +----------+--------+--------+--------+------------------+------------------+           PSV cm/sEDV cm/sStenosisPlaque DescriptionComments            +----------+--------+--------+--------+------------------+------------------+ CCA Prox  119     23                                intimal thickening +----------+--------+--------+--------+------------------+------------------+ CCA Distal100     20                                intimal thickening +----------+--------+--------+--------+------------------+------------------+ ICA Prox  59      20      1-39%   heterogenous      Shadowing          +----------+--------+--------+--------+------------------+------------------+ ICA Mid   65      26                                                   +----------+--------+--------+--------+------------------+------------------+ ICA Distal65      27                                                   +----------+--------+--------+--------+------------------+------------------+ ECA       110     17                                                   +----------+--------+--------+--------+------------------+------------------+ +----------+--------+--------+--------+-------------------+           PSV cm/sEDV cm/sDescribeArm Pressure (mmHG) +----------+--------+--------+--------+-------------------+ Subclavian131                                         +----------+--------+--------+--------+-------------------+ +---------+--------+--+--------+--+ VertebralPSV cm/s65EDV cm/s18 +---------+--------+--+--------+--+   Summary: Right Carotid: Velocities in the right ICA are consistent with a 1-39% stenosis. Left Carotid: Velocities in the left ICA are consistent with a 1-39% stenosis. Vertebrals:  Bilateral vertebral arteries demonstrate antegrade flow. Subclavians: Normal flow hemodynamics were seen in bilateral subclavian              arteries. *See table(s) above for measurements and observations.  Electronically signed by Servando Snare MD on 05/10/2022 at 10:46:18 AM.    Final    MR BRAIN WO CONTRAST  Result Date:  05/10/2022 CLINICAL DATA:  TIA.  Syncope after night sweats. EXAM: MRI HEAD WITHOUT CONTRAST TECHNIQUE: Multiplanar, multiecho pulse sequences of the brain and surrounding structures were obtained without intravenous contrast. COMPARISON:  Head CT from earlier today FINDINGS: Brain: No acute infarction, hemorrhage, hydrocephalus, extra-axial collection or mass lesion. Few small FLAIR hyperintensities scattered in the cerebral white matter, usually related to chronic small vessel ischemia especially given patient's small-vessel risk factors. Vascular: Major flow voids are preserved Skull and upper cervical spine: Normal marrow signal Sinuses/Orbits: Negative IMPRESSION: 1. No acute or reversible finding. 2. Few remote white matter insults, usually  chronic microvascular ischemia. Electronically Signed   By: Jorje Guild M.D.   On: 05/10/2022 07:58   CT HEAD WO CONTRAST  Result Date: 05/10/2022 CLINICAL DATA:  Recent syncopal episode, initial encounter EXAM: CT HEAD WITHOUT CONTRAST TECHNIQUE: Contiguous axial images were obtained from the base of the skull through the vertex without intravenous contrast. RADIATION DOSE REDUCTION: This exam was performed according to the departmental dose-optimization program which includes automated exposure control, adjustment of the mA and/or kV according to patient size and/or use of iterative reconstruction technique. COMPARISON:  None Available. FINDINGS: Brain: No evidence of acute infarction, hemorrhage, hydrocephalus, extra-axial collection or mass lesion/mass effect. Vascular: No hyperdense vessel or unexpected calcification. Skull: Normal. Negative for fracture or focal lesion. Sinuses/Orbits: No acute finding. Other: None. IMPRESSION: No acute intracranial abnormality noted. Electronically Signed   By: Inez Catalina M.D.   On: 05/10/2022 01:24     LOS: 1 day   Oren Binet, MD  [facility]    To contact the attending provider between 7A-7P or the  covering provider during after hours 7P-7A, please log into the web site [email or web address] and access using universal [place] password for that web site. If you do not have the password, please call the hospital operator.  05/11/2022, 9:03 AM

## 2022-05-11 NOTE — Progress Notes (Signed)
CARDIAC REHAB PHASE I   OHS education including OHS handout, OHS booklet, sternal precautions, home needs at discharge and mobility importance. All questions and concerns addressed.will continue to follow.  [id number]  Vanessa Barbara, RN BSN 05/11/2022 2:48 PM

## 2022-05-11 NOTE — Progress Notes (Signed)
ANTICOAGULATION CONSULT NOTE - Follow Up  Pharmacy Consult for heparin Indication: chest pain/ACS  Allergies  Allergen Reactions   Sulfonamide Derivatives     REACTION: hives    Patient Measurements: Height: 5' 6"$  (167.6 cm) Weight: 89.8 kg (198 lb) (Pt weighed at home) IBW/kg (Calculated) : 63.8 Heparin Dosing Weight: 83kg  Vital Signs: Temp: 98.3 F (36.8 C) (02/19 2328) Temp Source: Oral (02/19 2328) BP: 140/89 (02/19 2328) Pulse Rate: 65 (02/19 2328)  Labs: Recent Labs    05/09/22 1802 05/10/22 0122 05/10/22 0350 05/10/22 0505 05/11/22 0231  HGB 16.2  --   --   --  15.8  HCT 45.4  --   --   --  44.4  PLT 259  --   --   --  233  APTT  --   --  29  --   --   LABPROT  --   --  13.5  --   --   INR  --   --  1.0  --   --   HEPARINUNFRC  --   --   --   --  0.23*  CREATININE 0.94  --   --   --   --   TROPONINIHS  --  17 19* 17  --      Estimated Creatinine Clearance: 91 mL/min (by C-G formula based on SCr of 0.94 mg/dL).   Medical History: Past Medical History:  Diagnosis Date   ADHD (attention deficit hyperactivity disorder)    Anxiety state, unspecified    Bipolar 1 disorder ([place])    CHEST PAIN 12/04/2008   Qualifier: Diagnosis of  By: Glori Bickers MD, Carmell Austria    Depressive disorder, not elsewhere classified    Dermatophytosis of nail    Esophageal reflux    HYPERGLYCEMIA 07/10/2007   Qualifier: Diagnosis of  By: Glori Bickers MD, Carmell Austria    Kidney stones 09/22/2010   Low back pain 09/18/2013   Other chronic nonalcoholic liver disease    Problems with hearing    Prostatitis, unspecified    Pure hypercholesterolemia    Screen for STD (sexually transmitted disease) 04/20/2011   Sprain of cruciate ligament of knee    Tear of medial cartilage or meniscus of knee, current    TMJ (temporomandibular joint disorder) 12/25/2010   Unspecified essential hypertension    Unspecified gastritis and gastroduodenitis without mention of hemorrhage    Urethritis, unspecified      Medications:  Medications Prior to Admission  Medication Sig Dispense Refill Last Dose   ALPRAZolam (XANAX) 1 MG tablet Take 1 mg by mouth Once daily as needed for anxiety.   05/09/2022 at 2230   amLODipine (NORVASC) 10 MG tablet TAKE 2 TABLETS BY MOUTH DAILY AS DIRECTED (Patient taking differently: Take 10 mg by mouth daily.) 180 tablet 3 05/09/2022 at am   aspirin 81 MG chewable tablet Chew 1 tablet (81 mg total) by mouth daily. 30 tablet 0 05/09/2022 at am   fish oil-omega-3 fatty acids 1000 MG capsule Take 2 g by mouth daily.   05/09/2022 at am   isosorbide mononitrate (IMDUR) 30 MG 24 hr tablet Take 1 tablet (30 mg total) by mouth daily. 30 tablet 1 05/09/2022 at am   metoprolol succinate (TOPROL-XL) 25 MG 24 hr tablet Take 1 tablet (25 mg total) by mouth daily. 30 tablet 1 05/09/2022 at 0930   Multiple Vitamin (MULTIVITAMIN) capsule Take 1 capsule by mouth daily.   05/09/2022   nitroGLYCERIN (NITROSTAT) 0.4 MG  SL tablet Place 1 tablet (0.4 mg total) under the tongue every 5 (five) minutes as needed for chest pain. 30 tablet 3 05/09/2022   sertraline (ZOLOFT) 100 MG tablet Take 200 mg by mouth daily.   05/09/2022 at am   simvastatin (ZOCOR) 20 MG tablet Take 20 mg by mouth at bedtime.   05/09/2022 at pm   tamsulosin (FLOMAX) 0.4 MG CAPS capsule Take 0.4 mg by mouth daily.   05/09/2022 at am   traZODone (DESYREL) 50 MG tablet Take 100 mg by mouth at bedtime as needed for sleep.   05/09/2022 at pm   Scheduled:   amLODipine  10 mg Oral Daily   aspirin  81 mg Oral Daily   atorvastatin  40 mg Oral Daily   isosorbide mononitrate  30 mg Oral Daily   metoprolol succinate  25 mg Oral Daily   multivitamin with minerals  1 tablet Oral Daily   omega-3 acid ethyl esters  2 g Oral Daily   sertraline  200 mg Oral Daily   sodium chloride flush  3 mL Intravenous Q12H   sodium chloride flush  3 mL Intravenous Q12H   tamsulosin  0.4 mg Oral Daily   Infusions:   sodium chloride     heparin 1,200 Units/hr  (05/10/22 1921)    Assessment: Pt is s/p cath with multivessels dz. Plan for CABG consult. Heparin restarted 2 hr post radial band removal at ~1900. Heparin level returned sub-therapeutic at 0.23. Hgb and plts down trending slightly, but within normal range. No signs/symptoms of bleeding or issues with infusion documented  Scr 0.94>1.02  Goal of Therapy:  Heparin level 0.3-0.7 units/ml Monitor platelets by anticoagulation protocol: Yes   Plan:  Increase heparin to 1350 units/hr  Check 6 hr heparin level Daily heparin level and CBC F/u CABG  Sandford Craze, PharmD. [facility] Acute Care PGY-1 05/11/2022 3:25 AM

## 2022-05-11 NOTE — TOC Initial Note (Addendum)
Transition of Care [facility]) - Initial/Assessment Note    Patient Details  Name: Glenn Stewart MRN: [id number] Date of Birth: Jan 29, 1966  Transition of Care [facility]) CM/SW Contact:    Bethena Roys, RN Phone Number: 05/11/2022, 4:07 PM  Clinical Narrative:  Patient presented for unstable angina and syncope. Plan for CABG 05-12-22. PTA patient was from home alone and he states he will transition to his sisters home in [facility] once he discharges. Patient reports that he was not working prior to hospitalization and is without insurance. Patient has a PCP; however, he gets most Rx's from cardiologists. Patient states he gets most Rx's filled via [facility]- will follow for [facility] for medication assistance. Case Manager will continue to follow for transition of care needs as the patient progresses.                Expected Discharge Plan: Home/Self Care Barriers to Discharge: Continued Medical Work up   Patient Goals and CMS Choice Patient states their goals for this hospitalization and ongoing recovery are:: plan to return to his sisters home in [facility]   Choice offered to / list presented to : NA      Expected Discharge Plan and Services   Discharge Planning Services: CM Consult Post Acute Care Choice: NA Living arrangements for the past 2 months: Single Family Home                   DME Agency: NA   Prior Living Arrangements/Services Living arrangements for the past 2 months: Single Family Home Lives with:: Self Patient language and need for interpreter reviewed:: Yes        Need for Family Participation in Patient Care: Yes (Comment) Care giver support system in place?: Yes (comment)   Criminal Activity/Legal Involvement Pertinent to Current Situation/Hospitalization: No - Comment as needed  Activities of Daily Living Home Assistive Devices/Equipment: None ADL Screening (condition at time of admission) Patient's cognitive ability adequate to safely complete  daily activities?: Yes Is the patient deaf or have difficulty hearing?: No Does the patient have difficulty seeing, even when wearing glasses/contacts?: No Does the patient have difficulty concentrating, remembering, or making decisions?: No Patient able to express need for assistance with ADLs?: No Does the patient have difficulty dressing or bathing?: No Independently performs ADLs?: Yes (appropriate for developmental age) Does the patient have difficulty walking or climbing stairs?: No Weakness of Legs: None Weakness of Arms/Hands: None   Emotional Assessment Appearance:: Appears stated age Attitude/Demeanor/Rapport: Engaged Affect (typically observed): Appropriate Orientation: : Oriented to Situation, Oriented to  Time, Oriented to Place, Oriented to Self Alcohol / Substance Use: Not Applicable Psych Involvement: No (comment)  Admission diagnosis:  TIA (transient ischemic attack) [G45.9] Chest pain, rule out acute myocardial infarction [R07.9] Chest pain due to myocardial ischemia, unspecified ischemic chest pain type [I25.9] Unstable angina ([place]) [I20.0] Patient Active Problem List   Diagnosis Date Noted   Chest pain, rule out acute myocardial infarction 05/10/2022   Facial droop 05/10/2022   Unstable angina ([place]) 05/10/2022   Hand paresthesia 04/03/2020   Frequent urination 04/03/2020   Prostate cancer screening 04/03/2020   CAD (coronary artery disease) 04/01/2020   Low back pain 09/18/2013   Screen for STD (sexually transmitted disease) 04/20/2011   TMJ (temporomandibular joint disorder) 12/25/2010   Kidney stones 09/22/2010   Hematuria 09/22/2010   Routine general medical examination at a health care facility 07/22/2010   TINEA VERSICOLOR 10/29/2009   MEDIAL MENISCUS TEAR, LEFT  05/31/2008   ACL TEAR, LEFT KNEE 05/31/2008   Pain in joint, forearm 05/28/2008   PROBLEMS WITH HEARING 03/18/2008   Prediabetes 07/10/2007   DERMATOPHYTOSIS OF NAIL 04/10/2007    Dyslipidemia, goal LDL below 70 09/06/2006   ANXIETY 09/06/2006   DEPRESSION 09/06/2006   Essential hypertension 09/06/2006   GERD 09/06/2006   FATTY LIVER DISEASE 09/06/2006   PCP:  Abner Greenspan, MD Pharmacy:   [facility] # [id number], [place] [address] [place] [address] Phone: [phone] Fax: [phone]     Social Determinants of Health (SDOH) Social History: SDOH Screenings   Food Insecurity: No Food Insecurity (05/10/2022)  Housing: Low Risk  (05/10/2022)  Transportation Needs: No Transportation Needs (05/10/2022)  Utilities: Not At Risk (05/10/2022)  Tobacco Use: Low Risk  (05/11/2022)    Readmission Risk Interventions     No data to display

## 2022-05-11 NOTE — Progress Notes (Signed)
ANTICOAGULATION CONSULT NOTE - Follow Up  Pharmacy Consult for heparin Indication: chest pain/ACS  Allergies  Allergen Reactions   Sulfonamide Derivatives     REACTION: hives    Patient Measurements: Height: 5' 6"$  (167.6 cm) Weight: 89.8 kg (198 lb) (Pt weighed at home) IBW/kg (Calculated) : 63.8 Heparin Dosing Weight: 83kg  Vital Signs: Temp: 97.7 F (36.5 C) (02/20 0813) Temp Source: Oral (02/20 0813) BP: 141/90 (02/20 0813) Pulse Rate: 59 (02/20 0813)  Labs: Recent Labs    05/09/22 1802 05/10/22 0122 05/10/22 0350 05/10/22 0505 05/11/22 0231 05/11/22 1010  HGB 16.2  --   --   --  15.8  --   HCT 45.4  --   --   --  44.4  --   PLT 259  --   --   --  233  --   APTT  --   --  29  --   --   --   LABPROT  --   --  13.5  --   --   --   INR  --   --  1.0  --   --   --   HEPARINUNFRC  --   --   --   --  0.23* 0.34  CREATININE 0.94  --   --   --  1.02  --   TROPONINIHS  --  17 19* 17  --   --      Estimated Creatinine Clearance: 83.9 mL/min (by C-G formula based on SCr of 1.02 mg/dL).   Medical History: Past Medical History:  Diagnosis Date   ADHD (attention deficit hyperactivity disorder)    Anxiety state, unspecified    Bipolar 1 disorder ([place])    CHEST PAIN 12/04/2008   Qualifier: Diagnosis of  By: Glori Bickers MD, Carmell Austria    Depressive disorder, not elsewhere classified    Dermatophytosis of nail    Esophageal reflux    HYPERGLYCEMIA 07/10/2007   Qualifier: Diagnosis of  By: Glori Bickers MD, Carmell Austria    Kidney stones 09/22/2010   Low back pain 09/18/2013   Other chronic nonalcoholic liver disease    Problems with hearing    Prostatitis, unspecified    Pure hypercholesterolemia    Screen for STD (sexually transmitted disease) 04/20/2011   Sprain of cruciate ligament of knee    Tear of medial cartilage or meniscus of knee, current    TMJ (temporomandibular joint disorder) 12/25/2010   Unspecified essential hypertension    Unspecified gastritis and gastroduodenitis  without mention of hemorrhage    Urethritis, unspecified     Medications:  Medications Prior to Admission  Medication Sig Dispense Refill Last Dose   ALPRAZolam (XANAX) 1 MG tablet Take 1 mg by mouth Once daily as needed for anxiety.   05/09/2022 at 2230   amLODipine (NORVASC) 10 MG tablet TAKE 2 TABLETS BY MOUTH DAILY AS DIRECTED (Patient taking differently: Take 10 mg by mouth daily.) 180 tablet 3 05/09/2022 at am   aspirin 81 MG chewable tablet Chew 1 tablet (81 mg total) by mouth daily. 30 tablet 0 05/09/2022 at am   fish oil-omega-3 fatty acids 1000 MG capsule Take 2 g by mouth daily.   05/09/2022 at am   isosorbide mononitrate (IMDUR) 30 MG 24 hr tablet Take 1 tablet (30 mg total) by mouth daily. 30 tablet 1 05/09/2022 at am   metoprolol succinate (TOPROL-XL) 25 MG 24 hr tablet Take 1 tablet (25 mg total) by mouth daily. 30 tablet  1 05/09/2022 at 0930   Multiple Vitamin (MULTIVITAMIN) capsule Take 1 capsule by mouth daily.   05/09/2022   nitroGLYCERIN (NITROSTAT) 0.4 MG SL tablet Place 1 tablet (0.4 mg total) under the tongue every 5 (five) minutes as needed for chest pain. 30 tablet 3 05/09/2022   sertraline (ZOLOFT) 100 MG tablet Take 200 mg by mouth daily.   05/09/2022 at am   simvastatin (ZOCOR) 20 MG tablet Take 20 mg by mouth at bedtime.   05/09/2022 at pm   tamsulosin (FLOMAX) 0.4 MG CAPS capsule Take 0.4 mg by mouth daily.   05/09/2022 at am   traZODone (DESYREL) 50 MG tablet Take 100 mg by mouth at bedtime as needed for sleep.   05/09/2022 at pm   Scheduled:   amLODipine  10 mg Oral Daily   aspirin  81 mg Oral Daily   atorvastatin  40 mg Oral Daily   bisacodyl  5 mg Oral Once   chlorhexidine  60 mL Topical Once   And   [START ON 05/12/2022] chlorhexidine  60 mL Topical Once   [START ON 05/12/2022] chlorhexidine  15 mL Mouth/Throat Once   [START ON 05/12/2022] diazepam  5 mg Oral Once   [START ON 05/12/2022] epinephrine  0-10 mcg/min Intravenous To OR   [START ON 05/12/2022] heparin  sodium (porcine) 2,500 Units, papaverine 30 mg in electrolyte-A (PLASMALYTE-A PH 7.4) 500 mL irrigation   Irrigation To OR   [START ON 05/12/2022] insulin   Intravenous To OR   isosorbide mononitrate  30 mg Oral Daily   [START ON 05/12/2022] magnesium sulfate  40 mEq Other To OR   metoprolol succinate  25 mg Oral Daily   [START ON 05/12/2022] metoprolol tartrate  12.5 mg Oral Once   multivitamin with minerals  1 tablet Oral Daily   omega-3 acid ethyl esters  2 g Oral Daily   [START ON 05/12/2022] phenylephrine  30-200 mcg/min Intravenous To OR   [START ON 05/12/2022] potassium chloride  80 mEq Other To OR   sertraline  200 mg Oral Daily   sodium chloride flush  3 mL Intravenous Q12H   tamsulosin  0.4 mg Oral Daily   [START ON 05/12/2022] tranexamic acid  15 mg/kg Intravenous To OR   [START ON 05/12/2022] tranexamic acid  2 mg/kg Intracatheter To OR   Infusions:   sodium chloride     [START ON 05/12/2022]  ceFAZolin (ANCEF) IV     [START ON 05/12/2022]  ceFAZolin (ANCEF) IV     [START ON 05/12/2022] dexmedetomidine     [START ON 05/12/2022] heparin 30,000 units/NS 1000 mL solution for CELLSAVER     heparin 1,350 Units/hr (05/11/22 0452)   [START ON 05/12/2022] milrinone     [START ON 05/12/2022] nitroGLYCERIN     [START ON 05/12/2022] norepinephrine     [START ON 05/12/2022] tranexamic acid (CYKLOKAPRON) 2,500 mg in sodium chloride 0.9 % 250 mL (10 mg/mL) infusion     [START ON 05/12/2022] vancomycin      Assessment: Pt is s/p cath with multivessels dz. Plan for CABG consult. Heparin restarted 2 hr post radial band removal at ~1900.  Heparin level 0.3 at goal on heparin drip 1350 uts/hr. Hgb stable 15 pltc stable 200s . No signs/symptoms of bleeding or issues with infusion   Goal of Therapy:  Heparin level 0.3-0.7 units/ml Monitor platelets by anticoagulation protocol: Yes   Plan:  Continue heparin drip 1350 uts/hr  Follow up post op CABG 2/21   Bonnita Nasuti  Pharm.D. CPP, BCPS Clinical  Pharmacist [phone] 05/11/2022 3:28 PM

## 2022-05-12 ENCOUNTER — Inpatient Hospital Stay (HOSPITAL_COMMUNITY)
Admission: EM | Disposition: A | Discharge status: Home / Self Care | Payer: Self-pay | Source: Home / Self Care | Attending: Thoracic Surgery (Cardiothoracic Vascular Surgery)

## 2022-05-12 ENCOUNTER — Inpatient Hospital Stay (HOSPITAL_COMMUNITY): Payer: Medicaid Other | Admitting: Certified Registered Nurse Anesthetist

## 2022-05-12 ENCOUNTER — Inpatient Hospital Stay (HOSPITAL_COMMUNITY): Payer: Medicaid Other

## 2022-05-12 ENCOUNTER — Other Ambulatory Visit: Payer: Self-pay

## 2022-05-12 DIAGNOSIS — I2510 Atherosclerotic heart disease of native coronary artery without angina pectoris: Secondary | ICD-10-CM

## 2022-05-12 DIAGNOSIS — I25119 Atherosclerotic heart disease of native coronary artery with unspecified angina pectoris: Secondary | ICD-10-CM | POA: Diagnosis not present

## 2022-05-12 DIAGNOSIS — F319 Bipolar disorder, unspecified: Secondary | ICD-10-CM | POA: Diagnosis not present

## 2022-05-12 DIAGNOSIS — Z951 Presence of aortocoronary bypass graft: Secondary | ICD-10-CM

## 2022-05-12 DIAGNOSIS — G459 Transient cerebral ischemic attack, unspecified: Secondary | ICD-10-CM

## 2022-05-12 DIAGNOSIS — I088 Other rheumatic multiple valve diseases: Secondary | ICD-10-CM | POA: Diagnosis not present

## 2022-05-12 DIAGNOSIS — I10 Essential (primary) hypertension: Secondary | ICD-10-CM | POA: Diagnosis not present

## 2022-05-12 DIAGNOSIS — N329 Bladder disorder, unspecified: Secondary | ICD-10-CM

## 2022-05-12 HISTORY — PX: TEE WITHOUT CARDIOVERSION: SHX5443

## 2022-05-12 HISTORY — PX: CORONARY ARTERY BYPASS GRAFT: SHX141

## 2022-05-12 HISTORY — PX: RADIAL ARTERY HARVEST: SHX5067

## 2022-05-12 LAB — POCT I-STAT, CHEM 8
BUN: 19 mg/dL (ref 6–20)
BUN: 20 mg/dL (ref 6–20)
BUN: 17 mg/dL (ref 6–20)
BUN: 16 mg/dL (ref 6–20)
BUN: 18 mg/dL (ref 6–20)
Calcium, Ion: 1.26 mmol/L (ref 1.15–1.40)
Calcium, Ion: 1.27 mmol/L (ref 1.15–1.40)
Calcium, Ion: 1.12 mmol/L — ABNORMAL LOW (ref 1.15–1.40)
Calcium, Ion: 1.15 mmol/L (ref 1.15–1.40)
Calcium, Ion: 1.12 mmol/L — ABNORMAL LOW (ref 1.15–1.40)
Chloride: 103 mmol/L (ref 98–111)
Chloride: 103 mmol/L (ref 98–111)
Chloride: 104 mmol/L (ref 98–111)
Chloride: 104 mmol/L (ref 98–111)
Chloride: 104 mmol/L (ref 98–111)
Creatinine, Ser: 0.7 mg/dL (ref 0.61–1.24)
Creatinine, Ser: 0.7 mg/dL (ref 0.61–1.24)
Creatinine, Ser: 0.7 mg/dL (ref 0.61–1.24)
Creatinine, Ser: 0.7 mg/dL (ref 0.61–1.24)
Creatinine, Ser: 0.6 mg/dL — ABNORMAL LOW (ref 0.61–1.24)
Glucose, Bld: 151 mg/dL — ABNORMAL HIGH (ref 70–99)
Glucose, Bld: 105 mg/dL — ABNORMAL HIGH (ref 70–99)
Glucose, Bld: 98 mg/dL (ref 70–99)
Glucose, Bld: 119 mg/dL — ABNORMAL HIGH (ref 70–99)
Glucose, Bld: 127 mg/dL — ABNORMAL HIGH (ref 70–99)
HCT: 43 % (ref 39.0–52.0)
HCT: 38 % — ABNORMAL LOW (ref 39.0–52.0)
HCT: 32 % — ABNORMAL LOW (ref 39.0–52.0)
HCT: 33 % — ABNORMAL LOW (ref 39.0–52.0)
HCT: 35 % — ABNORMAL LOW (ref 39.0–52.0)
Hemoglobin: 14.6 g/dL (ref 13.0–17.0)
Hemoglobin: 12.9 g/dL — ABNORMAL LOW (ref 13.0–17.0)
Hemoglobin: 10.9 g/dL — ABNORMAL LOW (ref 13.0–17.0)
Hemoglobin: 11.2 g/dL — ABNORMAL LOW (ref 13.0–17.0)
Hemoglobin: 11.9 g/dL — ABNORMAL LOW (ref 13.0–17.0)
Potassium: 3.6 mmol/L (ref 3.5–5.1)
Potassium: 3.7 mmol/L (ref 3.5–5.1)
Potassium: 4.7 mmol/L (ref 3.5–5.1)
Potassium: 5.2 mmol/L — ABNORMAL HIGH (ref 3.5–5.1)
Potassium: 4.4 mmol/L (ref 3.5–5.1)
Sodium: 140 mmol/L (ref 135–145)
Sodium: 139 mmol/L (ref 135–145)
Sodium: 138 mmol/L (ref 135–145)
Sodium: 139 mmol/L (ref 135–145)
Sodium: 140 mmol/L (ref 135–145)
TCO2: 25 mmol/L (ref 22–32)
TCO2: 22 mmol/L (ref 22–32)
TCO2: 25 mmol/L (ref 22–32)
TCO2: 25 mmol/L (ref 22–32)
TCO2: 24 mmol/L (ref 22–32)

## 2022-05-12 LAB — BLOOD GAS, ARTERIAL
Acid-Base Excess: 0.1 mmol/L (ref 0.0–2.0)
Bicarbonate: 23.8 mmol/L (ref 20.0–28.0)
Drawn by: 67510
O2 Saturation: 100 %
Patient temperature: 36.9
pCO2 arterial: 35 mmHg (ref 32–48)
pH, Arterial: 7.44 (ref 7.35–7.45)
pO2, Arterial: 160 mmHg — ABNORMAL HIGH (ref 83–108)

## 2022-05-12 LAB — POCT I-STAT 7, (LYTES, BLD GAS, ICA,H+H)
Acid-base deficit: 3 mmol/L — ABNORMAL HIGH (ref 0.0–2.0)
Acid-base deficit: 2 mmol/L (ref 0.0–2.0)
Acid-base deficit: 3 mmol/L — ABNORMAL HIGH (ref 0.0–2.0)
Acid-base deficit: 2 mmol/L (ref 0.0–2.0)
Acid-base deficit: 3 mmol/L — ABNORMAL HIGH (ref 0.0–2.0)
Acid-base deficit: 1 mmol/L (ref 0.0–2.0)
Acid-base deficit: 4 mmol/L — ABNORMAL HIGH (ref 0.0–2.0)
Bicarbonate: 21.7 mmol/L (ref 20.0–28.0)
Bicarbonate: 22.8 mmol/L (ref 20.0–28.0)
Bicarbonate: 22 mmol/L (ref 20.0–28.0)
Bicarbonate: 22.6 mmol/L (ref 20.0–28.0)
Bicarbonate: 24.1 mmol/L (ref 20.0–28.0)
Bicarbonate: 23.2 mmol/L (ref 20.0–28.0)
Bicarbonate: 22.1 mmol/L (ref 20.0–28.0)
Calcium, Ion: 1.23 mmol/L (ref 1.15–1.40)
Calcium, Ion: 1.02 mmol/L — ABNORMAL LOW (ref 1.15–1.40)
Calcium, Ion: 1.13 mmol/L — ABNORMAL LOW (ref 1.15–1.40)
Calcium, Ion: 1.13 mmol/L — ABNORMAL LOW (ref 1.15–1.40)
Calcium, Ion: 1.2 mmol/L (ref 1.15–1.40)
Calcium, Ion: 1.18 mmol/L (ref 1.15–1.40)
Calcium, Ion: 1.21 mmol/L (ref 1.15–1.40)
HCT: 42 % (ref 39.0–52.0)
HCT: 30 % — ABNORMAL LOW (ref 39.0–52.0)
HCT: 31 % — ABNORMAL LOW (ref 39.0–52.0)
HCT: 32 % — ABNORMAL LOW (ref 39.0–52.0)
HCT: 33 % — ABNORMAL LOW (ref 39.0–52.0)
HCT: 34 % — ABNORMAL LOW (ref 39.0–52.0)
HCT: 36 % — ABNORMAL LOW (ref 39.0–52.0)
Hemoglobin: 14.3 g/dL (ref 13.0–17.0)
Hemoglobin: 10.2 g/dL — ABNORMAL LOW (ref 13.0–17.0)
Hemoglobin: 10.5 g/dL — ABNORMAL LOW (ref 13.0–17.0)
Hemoglobin: 10.9 g/dL — ABNORMAL LOW (ref 13.0–17.0)
Hemoglobin: 11.2 g/dL — ABNORMAL LOW (ref 13.0–17.0)
Hemoglobin: 11.6 g/dL — ABNORMAL LOW (ref 13.0–17.0)
Hemoglobin: 12.2 g/dL — ABNORMAL LOW (ref 13.0–17.0)
O2 Saturation: 100 %
O2 Saturation: 100 %
O2 Saturation: 100 %
O2 Saturation: 100 %
O2 Saturation: 96 %
O2 Saturation: 98 %
O2 Saturation: 98 %
Patient temperature: 35.5
Patient temperature: 37
Patient temperature: 37
Potassium: 3.4 mmol/L — ABNORMAL LOW (ref 3.5–5.1)
Potassium: 4.1 mmol/L (ref 3.5–5.1)
Potassium: 5.1 mmol/L (ref 3.5–5.1)
Potassium: 4.4 mmol/L (ref 3.5–5.1)
Potassium: 3.9 mmol/L (ref 3.5–5.1)
Potassium: 4.5 mmol/L (ref 3.5–5.1)
Potassium: 4.1 mmol/L (ref 3.5–5.1)
Sodium: 140 mmol/L (ref 135–145)
Sodium: 138 mmol/L (ref 135–145)
Sodium: 138 mmol/L (ref 135–145)
Sodium: 139 mmol/L (ref 135–145)
Sodium: 139 mmol/L (ref 135–145)
Sodium: 138 mmol/L (ref 135–145)
Sodium: 138 mmol/L (ref 135–145)
TCO2: 23 mmol/L (ref 22–32)
TCO2: 24 mmol/L (ref 22–32)
TCO2: 23 mmol/L (ref 22–32)
TCO2: 24 mmol/L (ref 22–32)
TCO2: 26 mmol/L (ref 22–32)
TCO2: 24 mmol/L (ref 22–32)
TCO2: 23 mmol/L (ref 22–32)
pCO2 arterial: 37.8 mmHg (ref 32–48)
pCO2 arterial: 40.3 mmHg (ref 32–48)
pCO2 arterial: 39.9 mmHg (ref 32–48)
pCO2 arterial: 35.6 mmHg (ref 32–48)
pCO2 arterial: 47.5 mmHg (ref 32–48)
pCO2 arterial: 37.8 mmHg (ref 32–48)
pCO2 arterial: 41.4 mmHg (ref 32–48)
pH, Arterial: 7.367 (ref 7.35–7.45)
pH, Arterial: 7.361 (ref 7.35–7.45)
pH, Arterial: 7.35 (ref 7.35–7.45)
pH, Arterial: 7.41 (ref 7.35–7.45)
pH, Arterial: 7.306 — ABNORMAL LOW (ref 7.35–7.45)
pH, Arterial: 7.396 (ref 7.35–7.45)
pH, Arterial: 7.336 — ABNORMAL LOW (ref 7.35–7.45)
pO2, Arterial: 353 mmHg — ABNORMAL HIGH (ref 83–108)
pO2, Arterial: 401 mmHg — ABNORMAL HIGH (ref 83–108)
pO2, Arterial: 269 mmHg — ABNORMAL HIGH (ref 83–108)
pO2, Arterial: 205 mmHg — ABNORMAL HIGH (ref 83–108)
pO2, Arterial: 81 mmHg — ABNORMAL LOW (ref 83–108)
pO2, Arterial: 99 mmHg (ref 83–108)
pO2, Arterial: 105 mmHg (ref 83–108)

## 2022-05-12 LAB — POCT I-STAT EG7
Acid-base deficit: 3 mmol/L — ABNORMAL HIGH (ref 0.0–2.0)
Bicarbonate: 23.1 mmol/L (ref 20.0–28.0)
Calcium, Ion: 1.11 mmol/L — ABNORMAL LOW (ref 1.15–1.40)
HCT: 32 % — ABNORMAL LOW (ref 39.0–52.0)
Hemoglobin: 10.9 g/dL — ABNORMAL LOW (ref 13.0–17.0)
O2 Saturation: 81 %
Potassium: 3.6 mmol/L (ref 3.5–5.1)
Sodium: 141 mmol/L (ref 135–145)
TCO2: 24 mmol/L (ref 22–32)
pCO2, Ven: 43.2 mmHg — ABNORMAL LOW (ref 44–60)
pH, Ven: 7.337 (ref 7.25–7.43)
pO2, Ven: 48 mmHg — ABNORMAL HIGH (ref 32–45)

## 2022-05-12 LAB — CBC
HCT: 44.9 % (ref 39.0–52.0)
HCT: 34.3 % — ABNORMAL LOW (ref 39.0–52.0)
HCT: 37.3 % — ABNORMAL LOW (ref 39.0–52.0)
Hemoglobin: 15.6 g/dL (ref 13.0–17.0)
Hemoglobin: 11.8 g/dL — ABNORMAL LOW (ref 13.0–17.0)
Hemoglobin: 12.6 g/dL — ABNORMAL LOW (ref 13.0–17.0)
MCH: 28.1 pg (ref 26.0–34.0)
MCH: 28.4 pg (ref 26.0–34.0)
MCH: 28.2 pg (ref 26.0–34.0)
MCHC: 34.7 g/dL (ref 30.0–36.0)
MCHC: 34.4 g/dL (ref 30.0–36.0)
MCHC: 33.8 g/dL (ref 30.0–36.0)
MCV: 80.9 fL (ref 80.0–100.0)
MCV: 82.5 fL (ref 80.0–100.0)
MCV: 83.4 fL (ref 80.0–100.0)
Platelets: 252 10*3/uL (ref 150–400)
Platelets: 142 10*3/uL — ABNORMAL LOW (ref 150–400)
Platelets: 188 10*3/uL (ref 150–400)
RBC: 5.55 MIL/uL (ref 4.22–5.81)
RBC: 4.16 MIL/uL — ABNORMAL LOW (ref 4.22–5.81)
RBC: 4.47 MIL/uL (ref 4.22–5.81)
RDW: 12.5 % (ref 11.5–15.5)
RDW: 12.6 % (ref 11.5–15.5)
RDW: 12.9 % (ref 11.5–15.5)
WBC: 7.6 10*3/uL (ref 4.0–10.5)
WBC: 8.4 10*3/uL (ref 4.0–10.5)
WBC: 9.6 10*3/uL (ref 4.0–10.5)
nRBC: 0 % (ref 0.0–0.2)
nRBC: 0 % (ref 0.0–0.2)
nRBC: 0 % (ref 0.0–0.2)

## 2022-05-12 LAB — GLUCOSE, CAPILLARY
Glucose-Capillary: 122 mg/dL — ABNORMAL HIGH (ref 70–99)
Glucose-Capillary: 140 mg/dL — ABNORMAL HIGH (ref 70–99)
Glucose-Capillary: 130 mg/dL — ABNORMAL HIGH (ref 70–99)
Glucose-Capillary: 131 mg/dL — ABNORMAL HIGH (ref 70–99)
Glucose-Capillary: 134 mg/dL — ABNORMAL HIGH (ref 70–99)
Glucose-Capillary: 135 mg/dL — ABNORMAL HIGH (ref 70–99)

## 2022-05-12 LAB — APTT: aPTT: 33 seconds (ref 24–36)

## 2022-05-12 LAB — BASIC METABOLIC PANEL
Anion gap: 8 (ref 5–15)
BUN: 13 mg/dL (ref 6–20)
CO2: 22 mmol/L (ref 22–32)
Calcium: 8 mg/dL — ABNORMAL LOW (ref 8.9–10.3)
Chloride: 109 mmol/L (ref 98–111)
Creatinine, Ser: 0.88 mg/dL (ref 0.61–1.24)
GFR, Estimated: >60 mL/min (ref >60)
Glucose, Bld: 120 mg/dL — ABNORMAL HIGH (ref 70–99)
Potassium: 4.3 mmol/L (ref 3.5–5.1)
Sodium: 139 mmol/L (ref 135–145)

## 2022-05-12 LAB — HEMOGLOBIN AND HEMATOCRIT, BLOOD
HCT: 31.8 % — ABNORMAL LOW (ref 39.0–52.0)
Hemoglobin: 11.3 g/dL — ABNORMAL LOW (ref 13.0–17.0)

## 2022-05-12 LAB — PLATELET COUNT: Platelets: 166 10*3/uL (ref 150–400)

## 2022-05-12 LAB — PROTIME-INR
INR: 1.3 — ABNORMAL HIGH (ref 0.8–1.2)
Prothrombin Time: 16.4 seconds — ABNORMAL HIGH (ref 11.4–15.2)

## 2022-05-12 LAB — LIPOPROTEIN A (LPA): Lipoprotein (a): 56.4 nmol/L — ABNORMAL HIGH (ref <75.0)

## 2022-05-12 LAB — MAGNESIUM: Magnesium: 3.5 mg/dL — ABNORMAL HIGH (ref 1.7–2.4)

## 2022-05-12 LAB — SURGICAL PCR SCREEN
MRSA, PCR: NEGATIVE
Staphylococcus aureus: POSITIVE — AB

## 2022-05-12 LAB — HEPARIN LEVEL (UNFRACTIONATED): Heparin Unfractionated: 0.32 IU/mL (ref 0.30–0.70)

## 2022-05-12 SURGERY — CORONARY ARTERY BYPASS GRAFTING (CABG)
Anesthesia: General | Site: Chest

## 2022-05-12 MED ORDER — FENTANYL CITRATE (PF) 250 MCG/5ML IJ SOLN
INTRAMUSCULAR | Status: AC
  Filled 2022-05-12: qty 5

## 2022-05-12 MED ORDER — VANCOMYCIN HCL IN DEXTROSE 1-5 GM/200ML-% IV SOLN
1000.0000 mg | Freq: Once | INTRAVENOUS | Status: AC
  Administered 2022-05-12: 1000 mg via INTRAVENOUS
  Filled 2022-05-12: qty 200

## 2022-05-12 MED ORDER — PROPOFOL 10 MG/ML IV BOLUS
INTRAVENOUS | Status: DC | PRN
  Administered 2022-05-12: 150 mg via INTRAVENOUS
  Administered 2022-05-12: 20 mg via INTRAVENOUS

## 2022-05-12 MED ORDER — PANTOPRAZOLE SODIUM 40 MG PO TBEC
40.0000 mg | DELAYED_RELEASE_TABLET | Freq: Every day | ORAL | Status: DC
  Administered 2022-05-14 – 2022-05-19 (×6): 40 mg via ORAL
  Filled 2022-05-12 (×6): qty 1

## 2022-05-12 MED ORDER — SODIUM CHLORIDE 0.9 % IV SOLN: INTRAVENOUS | Status: DC | PRN

## 2022-05-12 MED ORDER — LACTATED RINGERS IV SOLN
INTRAVENOUS | Status: DC
  Administered 2022-05-12: 10 mL/h via INTRAVENOUS

## 2022-05-12 MED ORDER — BISACODYL 10 MG RE SUPP: 10.0000 mg | Freq: Every day | RECTAL | Status: DC

## 2022-05-12 MED ORDER — METOPROLOL TARTRATE 5 MG/5ML IV SOLN
2.5000 mg | INTRAVENOUS | Status: DC | PRN
  Administered 2022-05-13: 2.5 mg via INTRAVENOUS
  Filled 2022-05-12 (×2): qty 5

## 2022-05-12 MED ORDER — ACETAMINOPHEN 160 MG/5ML PO SOLN: 1000.0000 mg | Freq: Four times a day (QID) | ORAL | Status: AC

## 2022-05-12 MED ORDER — HEPARIN SODIUM (PORCINE) 1000 UNIT/ML IJ SOLN
INTRAMUSCULAR | Status: AC
  Filled 2022-05-12: qty 1

## 2022-05-12 MED ORDER — LACTATED RINGERS IV SOLN: 500.0000 mL | Freq: Once | INTRAVENOUS | Status: DC | PRN

## 2022-05-12 MED ORDER — NITROGLYCERIN 0.2 MG/ML ON CALL CATH LAB
INTRAVENOUS | Status: DC | PRN
  Administered 2022-05-12 (×3): 40 ug via INTRAVENOUS
  Administered 2022-05-12: 20 ug via INTRAVENOUS
  Administered 2022-05-12: 40 ug via INTRAVENOUS

## 2022-05-12 MED ORDER — TRAMADOL HCL 50 MG PO TABS
50.0000 mg | ORAL_TABLET | ORAL | Status: DC | PRN
  Administered 2022-05-13: 100 mg via ORAL
  Filled 2022-05-12: qty 2

## 2022-05-12 MED ORDER — MORPHINE SULFATE (PF) 2 MG/ML IV SOLN
1.0000 mg | INTRAVENOUS | Status: DC | PRN
  Administered 2022-05-12: 4 mg via INTRAVENOUS
  Administered 2022-05-12: 2 mg via INTRAVENOUS
  Administered 2022-05-12: 4 mg via INTRAVENOUS
  Administered 2022-05-12: 2 mg via INTRAVENOUS
  Administered 2022-05-12 – 2022-05-13 (×2): 4 mg via INTRAVENOUS
  Filled 2022-05-12: qty 2
  Filled 2022-05-12 (×2): qty 1
  Filled 2022-05-12: qty 2
  Filled 2022-05-12: qty 1
  Filled 2022-05-12: qty 2
  Filled 2022-05-12: qty 1

## 2022-05-12 MED ORDER — MIDAZOLAM HCL (PF) 10 MG/2ML IJ SOLN
INTRAMUSCULAR | Status: AC
  Filled 2022-05-12: qty 2

## 2022-05-12 MED ORDER — LIDOCAINE 2% (20 MG/ML) 5 ML SYRINGE
INTRAMUSCULAR | Status: AC
  Filled 2022-05-12: qty 5

## 2022-05-12 MED ORDER — MIDAZOLAM HCL 2 MG/2ML IJ SOLN
2.0000 mg | INTRAMUSCULAR | Status: DC | PRN
  Administered 2022-05-12 (×2): 2 mg via INTRAVENOUS
  Filled 2022-05-12 (×2): qty 2

## 2022-05-12 MED ORDER — PROTAMINE SULFATE 10 MG/ML IV SOLN
INTRAVENOUS | Status: AC
  Filled 2022-05-12: qty 25

## 2022-05-12 MED ORDER — METOPROLOL TARTRATE 12.5 MG HALF TABLET
12.5000 mg | ORAL_TABLET | Freq: Two times a day (BID) | ORAL | Status: DC
  Administered 2022-05-12 – 2022-05-13 (×3): 12.5 mg via ORAL
  Filled 2022-05-12 (×3): qty 1

## 2022-05-12 MED ORDER — SODIUM CHLORIDE 0.9 % IV SOLN: 250.0000 mL | INTRAVENOUS | Status: DC

## 2022-05-12 MED ORDER — MAGNESIUM SULFATE 4 GM/100ML IV SOLN
4.0000 g | Freq: Once | INTRAVENOUS | Status: AC
  Administered 2022-05-12: 4 g via INTRAVENOUS
  Filled 2022-05-12: qty 100

## 2022-05-12 MED ORDER — PHENYLEPHRINE HCL-NACL 20-0.9 MG/250ML-% IV SOLN
INTRAVENOUS | Status: AC
  Filled 2022-05-12: qty 250

## 2022-05-12 MED ORDER — ROCURONIUM BROMIDE 10 MG/ML (PF) SYRINGE
PREFILLED_SYRINGE | INTRAVENOUS | Status: AC
  Filled 2022-05-12: qty 10

## 2022-05-12 MED ORDER — KETOROLAC TROMETHAMINE 30 MG/ML IJ SOLN
30.0000 mg | Freq: Four times a day (QID) | INTRAMUSCULAR | Status: AC
  Administered 2022-05-12 – 2022-05-13 (×4): 30 mg via INTRAVENOUS
  Filled 2022-05-12 (×4): qty 1

## 2022-05-12 MED ORDER — MIDAZOLAM HCL (PF) 5 MG/ML IJ SOLN
INTRAMUSCULAR | Status: DC | PRN
  Administered 2022-05-12 (×5): 2 mg via INTRAVENOUS

## 2022-05-12 MED ORDER — INSULIN REGULAR(HUMAN) IN NACL 100-0.9 UT/100ML-% IV SOLN: INTRAVENOUS | Status: DC

## 2022-05-12 MED ORDER — POTASSIUM CHLORIDE 10 MEQ/50ML IV SOLN
10.0000 meq | INTRAVENOUS | Status: AC
  Administered 2022-05-12 (×3): 10 meq via INTRAVENOUS
  Filled 2022-05-12 (×2): qty 50

## 2022-05-12 MED ORDER — LACTATED RINGERS IV SOLN: INTRAVENOUS | Status: DC | PRN

## 2022-05-12 MED ORDER — ARTIFICIAL TEARS OPHTHALMIC OINT
TOPICAL_OINTMENT | OPHTHALMIC | Status: AC
  Filled 2022-05-12: qty 3.5

## 2022-05-12 MED ORDER — PHENYLEPHRINE HCL-NACL 20-0.9 MG/250ML-% IV SOLN: 0.0000 ug/min | INTRAVENOUS | Status: DC

## 2022-05-12 MED ORDER — SODIUM CHLORIDE (PF) 0.9 % IJ SOLN
INTRAMUSCULAR | Status: AC
  Filled 2022-05-12: qty 10

## 2022-05-12 MED ORDER — LABETALOL HCL 5 MG/ML IV SOLN
INTRAVENOUS | Status: DC | PRN
  Administered 2022-05-12: 5 mg via INTRAVENOUS

## 2022-05-12 MED ORDER — NITROGLYCERIN IN D5W 200-5 MCG/ML-% IV SOLN
0.0000 ug/min | INTRAVENOUS | Status: DC
  Filled 2022-05-12: qty 250

## 2022-05-12 MED ORDER — ROCURONIUM BROMIDE 10 MG/ML (PF) SYRINGE
PREFILLED_SYRINGE | INTRAVENOUS | Status: DC | PRN
  Administered 2022-05-12: 50 mg via INTRAVENOUS
  Administered 2022-05-12: 100 mg via INTRAVENOUS
  Administered 2022-05-12 (×2): 50 mg via INTRAVENOUS

## 2022-05-12 MED ORDER — ASPIRIN 81 MG PO CHEW: 324.0000 mg | CHEWABLE_TABLET | Freq: Every day | ORAL | Status: DC

## 2022-05-12 MED ORDER — MUPIROCIN 2 % EX OINT
1.0000 | TOPICAL_OINTMENT | Freq: Two times a day (BID) | CUTANEOUS | Status: AC
  Administered 2022-05-12 – 2022-05-16 (×10): 1 via NASAL
  Filled 2022-05-12 (×3): qty 22

## 2022-05-12 MED ORDER — ONDANSETRON HCL 4 MG/2ML IJ SOLN
4.0000 mg | Freq: Four times a day (QID) | INTRAMUSCULAR | Status: DC | PRN
  Administered 2022-05-13 (×2): 4 mg via INTRAVENOUS
  Filled 2022-05-12 (×2): qty 2

## 2022-05-12 MED ORDER — 0.9 % SODIUM CHLORIDE (POUR BTL) OPTIME
TOPICAL | Status: DC | PRN
  Administered 2022-05-12: 5000 mL

## 2022-05-12 MED ORDER — ACETAMINOPHEN 160 MG/5ML PO SOLN: 650.0000 mg | Freq: Once | ORAL | Status: AC

## 2022-05-12 MED ORDER — SODIUM CHLORIDE 0.45 % IV SOLN: INTRAVENOUS | Status: DC | PRN

## 2022-05-12 MED ORDER — DEXMEDETOMIDINE HCL IN NACL 400 MCG/100ML IV SOLN
0.0000 ug/kg/h | INTRAVENOUS | Status: DC
  Administered 2022-05-12: 0.7 ug/kg/h via INTRAVENOUS
  Filled 2022-05-12: qty 100

## 2022-05-12 MED ORDER — SODIUM CHLORIDE 0.9% FLUSH
3.0000 mL | Freq: Two times a day (BID) | INTRAVENOUS | Status: DC
  Administered 2022-05-13 (×2): 3 mL via INTRAVENOUS

## 2022-05-12 MED ORDER — MUPIROCIN 2 % EX OINT: 1.0000 | TOPICAL_OINTMENT | Freq: Two times a day (BID) | CUTANEOUS | Status: DC

## 2022-05-12 MED ORDER — ACETAMINOPHEN 650 MG RE SUPP
650.0000 mg | Freq: Once | RECTAL | Status: AC
  Administered 2022-05-12: 650 mg via RECTAL

## 2022-05-12 MED ORDER — NITROGLYCERIN IN D5W 200-5 MCG/ML-% IV SOLN: 7.0000 ug/min | INTRAVENOUS | Status: DC

## 2022-05-12 MED ORDER — SODIUM CHLORIDE 0.9% FLUSH: 3.0000 mL | INTRAVENOUS | Status: DC | PRN

## 2022-05-12 MED ORDER — OXYCODONE HCL 5 MG PO TABS
5.0000 mg | ORAL_TABLET | ORAL | Status: DC | PRN
  Administered 2022-05-13 – 2022-05-18 (×13): 10 mg via ORAL
  Filled 2022-05-12 (×14): qty 2

## 2022-05-12 MED ORDER — DEXTROSE 50 % IV SOLN: 0.0000 mL | INTRAVENOUS | Status: DC | PRN

## 2022-05-12 MED ORDER — ARTIFICIAL TEARS OPHTHALMIC OINT
TOPICAL_OINTMENT | OPHTHALMIC | Status: DC | PRN
  Administered 2022-05-12: 1 via OPHTHALMIC

## 2022-05-12 MED ORDER — DOBUTAMINE IN D5W 4-5 MG/ML-% IV SOLN
2.5000 ug/kg/min | INTRAVENOUS | Status: DC
  Administered 2022-05-12: 3 ug/kg/min via INTRAVENOUS

## 2022-05-12 MED ORDER — CHLORHEXIDINE GLUCONATE CLOTH 2 % EX PADS: 6.0000 | MEDICATED_PAD | Freq: Every day | CUTANEOUS | Status: DC

## 2022-05-12 MED ORDER — SODIUM CHLORIDE (PF) 0.9 % IJ SOLN
OROMUCOSAL | Status: DC | PRN
  Administered 2022-05-12: 12 mL via TOPICAL

## 2022-05-12 MED ORDER — HEPARIN SODIUM (PORCINE) 1000 UNIT/ML IJ SOLN
INTRAMUSCULAR | Status: DC | PRN
  Administered 2022-05-12: 2000 [IU] via INTRAVENOUS
  Administered 2022-05-12: 5000 [IU] via INTRAVENOUS
  Administered 2022-05-12: 28000 [IU] via INTRAVENOUS

## 2022-05-12 MED ORDER — FAMOTIDINE IN NACL 20-0.9 MG/50ML-% IV SOLN
20.0000 mg | Freq: Two times a day (BID) | INTRAVENOUS | Status: AC
  Administered 2022-05-12 (×2): 20 mg via INTRAVENOUS
  Filled 2022-05-12 (×2): qty 50

## 2022-05-12 MED ORDER — METOPROLOL TARTRATE 25 MG/10 ML ORAL SUSPENSION: 12.5000 mg | Freq: Two times a day (BID) | ORAL | Status: DC

## 2022-05-12 MED ORDER — ORAL CARE MOUTH RINSE: 15.0000 mL | OROMUCOSAL | Status: DC | PRN

## 2022-05-12 MED ORDER — PROTAMINE SULFATE 10 MG/ML IV SOLN
INTRAVENOUS | Status: DC | PRN
  Administered 2022-05-12: 280 mg via INTRAVENOUS
  Administered 2022-05-12: 20 mg via INTRAVENOUS

## 2022-05-12 MED ORDER — PROPOFOL 10 MG/ML IV BOLUS
INTRAVENOUS | Status: AC
  Filled 2022-05-12: qty 20

## 2022-05-12 MED ORDER — ALBUMIN HUMAN 5 % IV SOLN: INTRAVENOUS | Status: DC | PRN

## 2022-05-12 MED ORDER — SODIUM CHLORIDE 0.9 % IV SOLN: INTRAVENOUS | Status: DC

## 2022-05-12 MED ORDER — ASPIRIN 325 MG PO TBEC
325.0000 mg | DELAYED_RELEASE_TABLET | Freq: Every day | ORAL | Status: DC
  Administered 2022-05-13 – 2022-05-17 (×5): 325 mg via ORAL
  Filled 2022-05-12 (×5): qty 1

## 2022-05-12 MED ORDER — BISACODYL 5 MG PO TBEC
10.0000 mg | DELAYED_RELEASE_TABLET | Freq: Every day | ORAL | Status: DC
  Administered 2022-05-13 – 2022-05-15 (×3): 10 mg via ORAL
  Filled 2022-05-12 (×5): qty 2

## 2022-05-12 MED ORDER — ALBUMIN HUMAN 5 % IV SOLN: 250.0000 mL | INTRAVENOUS | Status: AC | PRN

## 2022-05-12 MED ORDER — FENTANYL CITRATE (PF) 250 MCG/5ML IJ SOLN
INTRAMUSCULAR | Status: DC | PRN
  Administered 2022-05-12: 200 ug via INTRAVENOUS
  Administered 2022-05-12: 100 ug via INTRAVENOUS
  Administered 2022-05-12 (×3): 50 ug via INTRAVENOUS
  Administered 2022-05-12 (×2): 100 ug via INTRAVENOUS
  Administered 2022-05-12 (×2): 50 ug via INTRAVENOUS
  Administered 2022-05-12: 100 ug via INTRAVENOUS
  Administered 2022-05-12 (×3): 50 ug via INTRAVENOUS
  Administered 2022-05-12: 100 ug via INTRAVENOUS
  Administered 2022-05-12 (×5): 50 ug via INTRAVENOUS

## 2022-05-12 MED ORDER — CHLORHEXIDINE GLUCONATE CLOTH 2 % EX PADS
6.0000 | MEDICATED_PAD | Freq: Every day | CUTANEOUS | Status: DC
  Administered 2022-05-12 – 2022-05-18 (×6): 6 via TOPICAL

## 2022-05-12 MED ORDER — ACETAMINOPHEN 500 MG PO TABS
1000.0000 mg | ORAL_TABLET | Freq: Four times a day (QID) | ORAL | Status: AC
  Administered 2022-05-13 – 2022-05-17 (×17): 1000 mg via ORAL
  Filled 2022-05-12 (×18): qty 2

## 2022-05-12 MED ORDER — PLASMA-LYTE A IV SOLN: INTRAVENOUS | Status: DC | PRN

## 2022-05-12 MED ORDER — CEFAZOLIN SODIUM-DEXTROSE 2-4 GM/100ML-% IV SOLN
2.0000 g | Freq: Three times a day (TID) | INTRAVENOUS | Status: AC
  Administered 2022-05-12 – 2022-05-14 (×6): 2 g via INTRAVENOUS
  Filled 2022-05-12 (×6): qty 100

## 2022-05-12 MED ORDER — HEMOSTATIC AGENTS (NO CHARGE) OPTIME
TOPICAL | Status: DC | PRN
  Administered 2022-05-12: 1 via TOPICAL

## 2022-05-12 MED ORDER — CHLORHEXIDINE GLUCONATE 0.12 % MT SOLN
15.0000 mL | OROMUCOSAL | Status: AC
  Administered 2022-05-12: 15 mL via OROMUCOSAL
  Filled 2022-05-12: qty 15

## 2022-05-12 MED ORDER — HEPARIN SODIUM (PORCINE) 1000 UNIT/ML IJ SOLN
INTRAMUSCULAR | Status: AC
  Filled 2022-05-12: qty 10

## 2022-05-12 MED ORDER — LACTATED RINGERS IV SOLN: INTRAVENOUS | Status: DC

## 2022-05-12 MED ORDER — DOCUSATE SODIUM 100 MG PO CAPS
200.0000 mg | ORAL_CAPSULE | Freq: Every day | ORAL | Status: DC
  Administered 2022-05-13 – 2022-05-18 (×6): 200 mg via ORAL
  Filled 2022-05-12 (×7): qty 2

## 2022-05-12 SURGICAL SUPPLY — 111 items
ADH SKN CLS APL DERMABOND .7 (GAUZE/BANDAGES/DRESSINGS) ×3
ANTIFOG SOL W/FOAM PAD STRL (MISCELLANEOUS) ×3
APPLIER CLIP 9.375 SM OPEN (CLIP)
APR CLP SM 9.3 20 MLT OPN (CLIP)
BAG DECANTER FOR FLEXI CONT (MISCELLANEOUS) ×3 IMPLANT
BLADE CLIPPER SURG (BLADE) ×3 IMPLANT
BLADE STERNUM SYSTEM 6 (BLADE) ×3 IMPLANT
BLADE SURG 11 STRL SS (BLADE) IMPLANT
BLADE SURG 15 STRL LF DISP TIS (BLADE) IMPLANT
BLADE SURG 15 STRL SS (BLADE)
BNDG ELASTIC 4X5.8 VLCR STR LF (GAUZE/BANDAGES/DRESSINGS) ×3 IMPLANT
BNDG ELASTIC 6X5.8 VLCR STR LF (GAUZE/BANDAGES/DRESSINGS) ×3 IMPLANT
BNDG GAUZE DERMACEA FLUFF 4 (GAUZE/BANDAGES/DRESSINGS) ×3 IMPLANT
BNDG GZE DERMACEA 4 6PLY (GAUZE/BANDAGES/DRESSINGS) ×6
CANISTER SUCT 3000ML PPV (MISCELLANEOUS) ×3 IMPLANT
CANNULA EZ GLIDE AORTIC 21FR (CANNULA) ×3 IMPLANT
CANNULA MC2 2 STG 29/37 NON-V (CANNULA) IMPLANT
CANNULA MC2 TWO STAGE (CANNULA) ×3
CATH CPB KIT HENDRICKSON (MISCELLANEOUS) ×3 IMPLANT
CATH ROBINSON RED A/P 18FR (CATHETERS) ×3 IMPLANT
CATH THORACIC 36FR (CATHETERS) ×3 IMPLANT
CATH THORACIC 36FR RT ANG (CATHETERS) ×3 IMPLANT
CLIP APPLIE 9.375 SM OPEN (CLIP) IMPLANT
CLIP FOGARTY SPRING 6M (CLIP) IMPLANT
CLIP TI MEDIUM 24 (CLIP) IMPLANT
CLIP TI WIDE RED SMALL 24 (CLIP) IMPLANT
CONN DRAIN DUAL-IN SOMAVAC (TUBING) ×3
CONNECTOR DRAIN DUAL-IN SOMAVC (TUBING) IMPLANT
CONTAINER PROTECT SURGISLUSH (MISCELLANEOUS) ×6 IMPLANT
COVER MAYO STAND STRL (DRAPES) IMPLANT
CUFF TOURN SGL QUICK 18X4 (TOURNIQUET CUFF) IMPLANT
CUFF TOURN SGL QUICK 24 (TOURNIQUET CUFF)
CUFF TRNQT CYL 24X4X16.5-23 (TOURNIQUET CUFF) IMPLANT
DERMABOND ADVANCED .7 DNX12 (GAUZE/BANDAGES/DRESSINGS) IMPLANT
DRAPE CARDIOVASCULAR INCISE (DRAPES) ×3
DRAPE EXTREMITY T 121X128X90 (DISPOSABLE) ×3 IMPLANT
DRAPE HALF SHEET 40X57 (DRAPES) IMPLANT
DRAPE SRG 135X102X78XABS (DRAPES) ×3 IMPLANT
DRAPE WARM FLUID 44X44 (DRAPES) ×3 IMPLANT
DRSG COVADERM 4X14 (GAUZE/BANDAGES/DRESSINGS) ×3 IMPLANT
ELECT REM PT RETURN 9FT ADLT (ELECTROSURGICAL) ×6
ELECTRODE REM PT RTRN 9FT ADLT (ELECTROSURGICAL) ×6 IMPLANT
FELT TEFLON 1X6 (MISCELLANEOUS) ×6 IMPLANT
GAUZE 4X4 16PLY ~~LOC~~+RFID DBL (SPONGE) ×3 IMPLANT
GAUZE SPONGE 4X4 12PLY STRL (GAUZE/BANDAGES/DRESSINGS) ×6 IMPLANT
GEL ULTRASOUND 20GR AQUASONIC (MISCELLANEOUS) IMPLANT
GLOVE BIO SURGEON STRL SZ 6 (GLOVE) IMPLANT
GLOVE BIO SURGEON STRL SZ 6.5 (GLOVE) IMPLANT
GLOVE INDICATOR 7.5 STRL GRN (GLOVE) IMPLANT
GLOVE SS BIOGEL STRL SZ 6 (GLOVE) IMPLANT
GLOVE SS BIOGEL STRL SZ 7.5 (GLOVE) ×3 IMPLANT
GLOVE SURG SIGNA 7.5 PF LTX (GLOVE) ×9 IMPLANT
GOWN STRL REUS W/ TWL LRG LVL3 (GOWN DISPOSABLE) ×12 IMPLANT
GOWN STRL REUS W/ TWL XL LVL3 (GOWN DISPOSABLE) ×6 IMPLANT
GOWN STRL REUS W/TWL LRG LVL3 (GOWN DISPOSABLE) ×12
GOWN STRL REUS W/TWL XL LVL3 (GOWN DISPOSABLE) ×6
HEMOSTAT POWDER SURGIFOAM 1G (HEMOSTASIS) ×9 IMPLANT
HEMOSTAT SURGICEL 2X14 (HEMOSTASIS) ×3 IMPLANT
INSERT FOGARTY XLG (MISCELLANEOUS) IMPLANT
KIT BASIN OR (CUSTOM PROCEDURE TRAY) ×3 IMPLANT
KIT SUCTION CATH 14FR (SUCTIONS) ×6 IMPLANT
KIT TURNOVER KIT B (KITS) ×3 IMPLANT
KIT VASOVIEW HEMOPRO 2 VH 4000 (KITS) ×3 IMPLANT
MARKER GRAFT CORONARY BYPASS (MISCELLANEOUS) ×9 IMPLANT
NS IRRIG 1000ML POUR BTL (IV SOLUTION) ×15 IMPLANT
PACK E OPEN HEART (SUTURE) ×3 IMPLANT
PACK OPEN HEART (CUSTOM PROCEDURE TRAY) ×3 IMPLANT
PAD ARMBOARD 7.5X6 YLW CONV (MISCELLANEOUS) ×6 IMPLANT
PAD ELECT DEFIB RADIOL ZOLL (MISCELLANEOUS) ×3 IMPLANT
PENCIL BUTTON HOLSTER BLD 10FT (ELECTRODE) ×3 IMPLANT
POSITIONER HEAD DONUT 9IN (MISCELLANEOUS) ×3 IMPLANT
PUNCH AORTIC ROTATE 4.0MM (MISCELLANEOUS) IMPLANT
PUNCH AORTIC ROTATE 4.5MM 8IN (MISCELLANEOUS) IMPLANT
PUNCH AORTIC ROTATE 5MM 8IN (MISCELLANEOUS) IMPLANT
SET MPS 3-ND DEL (MISCELLANEOUS) IMPLANT
SHEARS HARMONIC 9CM CVD (BLADE) ×3 IMPLANT
SOLUTION ANTFG W/FOAM PAD STRL (MISCELLANEOUS) IMPLANT
SPONGE T-LAP 18X18 ~~LOC~~+RFID (SPONGE) ×12 IMPLANT
SPONGE T-LAP 4X18 ~~LOC~~+RFID (SPONGE) ×3 IMPLANT
SUPPORT HEART JANKE-BARRON (MISCELLANEOUS) ×3 IMPLANT
SUT BONE WAX W31G (SUTURE) ×3 IMPLANT
SUT MNCRL AB 4-0 PS2 18 (SUTURE) IMPLANT
SUT PROLENE 3 0 SH DA (SUTURE) ×3 IMPLANT
SUT PROLENE 4 0 RB 1 (SUTURE) ×3
SUT PROLENE 4 0 SH DA (SUTURE) IMPLANT
SUT PROLENE 4-0 RB1 .5 CRCL 36 (SUTURE) IMPLANT
SUT PROLENE 6 0 C 1 30 (SUTURE) ×6 IMPLANT
SUT PROLENE 7 0 BV1 MDA (SUTURE) ×3 IMPLANT
SUT PROLENE 8 0 BV175 6 (SUTURE) IMPLANT
SUT STEEL 6MS V (SUTURE) ×3 IMPLANT
SUT STEEL STERNAL CCS#1 18IN (SUTURE) IMPLANT
SUT STEEL SZ 6 DBL 3X14 BALL (SUTURE) ×3 IMPLANT
SUT VIC AB 1 CTX 36 (SUTURE) ×9
SUT VIC AB 1 CTX36XBRD ANBCTR (SUTURE) ×6 IMPLANT
SUT VIC AB 2-0 CT1 27 (SUTURE) ×6
SUT VIC AB 2-0 CT1 TAPERPNT 27 (SUTURE) IMPLANT
SUT VIC AB 2-0 CTX 27 (SUTURE) IMPLANT
SUT VIC AB 3-0 SH 27 (SUTURE)
SUT VIC AB 3-0 SH 27X BRD (SUTURE) IMPLANT
SUT VIC AB 3-0 X1 27 (SUTURE) IMPLANT
SUT VICRYL 4-0 PS2 18IN ABS (SUTURE) IMPLANT
SYR 50ML SLIP (SYRINGE) IMPLANT
SYSTEM SAHARA CHEST DRAIN ATS (WOUND CARE) ×3 IMPLANT
TAPE CLOTH SURG 4X10 WHT LF (GAUZE/BANDAGES/DRESSINGS) IMPLANT
TAPE PAPER 2X10 WHT MICROPORE (GAUZE/BANDAGES/DRESSINGS) IMPLANT
TOWEL GREEN STERILE (TOWEL DISPOSABLE) ×3 IMPLANT
TOWEL GREEN STERILE FF (TOWEL DISPOSABLE) ×3 IMPLANT
TRAY FOLEY SLVR 16FR TEMP STAT (SET/KITS/TRAYS/PACK) ×3 IMPLANT
TUBING LAP HI FLOW INSUFFLATIO (TUBING) ×3 IMPLANT
UNDERPAD 30X36 HEAVY ABSORB (UNDERPADS AND DIAPERS) ×3 IMPLANT
WATER STERILE IRR 1000ML POUR (IV SOLUTION) ×6 IMPLANT

## 2022-05-12 NOTE — Anesthesia Procedure Notes (Addendum)
Procedure Name: Intubation Date/Time: 05/12/2022 9:06 AM  Performed by: Janene Harvey, CRNAPre-anesthesia Checklist: Patient identified, Emergency Drugs available, Suction available and Patient being monitored Patient Re-evaluated:Patient Re-evaluated prior to induction Oxygen Delivery Method: Circle system utilized Preoxygenation: Pre-oxygenation with 100% oxygen Induction Type: IV induction Ventilation: Mask ventilation without difficulty Laryngoscope Size: Mac and 4 Grade View: Grade I Tube type: Oral Tube size: 8.0 mm Number of attempts: 1 Airway Equipment and Method: Stylet and Oral airway Placement Confirmation: ETT inserted through vocal cords under direct vision, positive ETCO2 and breath sounds checked- equal and bilateral Secured at: 23 cm Tube secured with: Tape Dental Injury: Teeth and Oropharynx as per pre-operative assessment  Comments: By Fernanda Drum

## 2022-05-12 NOTE — Progress Notes (Signed)
TCTS Progress Note: Day of Surgery Procedure(s) (LRB): CORONARY ARTERY BYPASS GRAFTING (CABG) X FIVE BYPASSES USING OPEN LEFT INTERNAL MAMMARY ARTERY, OPEN LEFT RADIAL ARTERY, AND ENDOSCOPIC RIGHT GREATER SAPHENOUS VEIN HARVEST. (N/A) RADIAL ARTERY HARVEST (Left) TRANSESOPHAGEAL ECHOCARDIOGRAM (N/A)  LOS: 2 days   Apaced at 77 Nitro at 10 Weaning in progress Xr with swan and ETT appropriate position     Latest Ref Rng & Units 05/12/2022    2:56 PM 05/12/2022    2:53 PM 05/12/2022    1:44 PM  CBC  WBC 4.0 - 10.5 K/uL  8.4    Hemoglobin 13.0 - 17.0 g/dL 11.2  11.8  10.9   Hematocrit 39.0 - 52.0 % 33.0  34.3  32.0   Platelets 150 - 400 K/uL  142         Latest Ref Rng & Units 05/12/2022    2:56 PM 05/12/2022    1:44 PM 05/12/2022    1:39 PM  CMP  Glucose 70 - 99 mg/dL   127   BUN 6 - 20 mg/dL   18   Creatinine 0.61 - 1.24 mg/dL   0.60   Sodium 135 - 145 mmol/L 139  139  140   Potassium 3.5 - 5.1 mmol/L 3.9  4.4  4.4   Chloride 98 - 111 mmol/L   104     ABG    Component Value Date/Time   PHART 7.306 (L) 05/12/2022 1456   PCO2ART 47.5 05/12/2022 1456   PO2ART 81 (L) 05/12/2022 1456   HCO3 24.1 05/12/2022 1456   TCO2 26 05/12/2022 1456   ACIDBASEDEF 3.0 (H) 05/12/2022 1456   O2SAT 96 05/12/2022 1456    Vent Mode: PSV;CPAP FiO2 (%):  [40 %-50 %] 40 % Set Rate:  [4 bmp-18 bmp] 4 bmp Vt Set:  [510 mL-640 mL] 640 mL PEEP:  [5 cmH20] 5 cmH20 Pressure Support:  [10 cmH20] 10 cmH20 Plateau Pressure:  [17 cmH20-20 cmH20] 17 cmH20

## 2022-05-12 NOTE — Discharge Summary (Signed)
[address]       [place] [address]             [phone]    Physician Discharge Summary  Patient ID: Glenn Stewart MRN: [id number] DOB/AGE: 1965-12-21 57 y.o.  Admit date: 05/09/2022 Discharge date: 05/19/2022  Admission Diagnoses:  Patient Active Problem List   Diagnosis Date Noted   S/P CABG x 5 05/12/2022   Chest pain, rule out acute myocardial infarction 05/10/2022   Facial droop 05/10/2022   Unstable angina ([place]) 05/10/2022   Hand paresthesia 04/03/2020   Frequent urination 04/03/2020   Prostate cancer screening 04/03/2020   CAD (coronary artery disease) 04/01/2020   Low back pain 09/18/2013   Screen for STD (sexually transmitted disease) 04/20/2011   TMJ (temporomandibular joint disorder) 12/25/2010   Kidney stones 09/22/2010   Hematuria 09/22/2010   Routine general medical examination at a health care facility 07/22/2010   TINEA VERSICOLOR 10/29/2009   MEDIAL MENISCUS TEAR, LEFT 05/31/2008   ACL TEAR, LEFT KNEE 05/31/2008   Pain in joint, forearm 05/28/2008   PROBLEMS WITH HEARING 03/18/2008   Prediabetes 07/10/2007   DERMATOPHYTOSIS OF NAIL 04/10/2007   Dyslipidemia, goal LDL below 70 09/06/2006   ANXIETY 09/06/2006   DEPRESSION 09/06/2006   Essential hypertension 09/06/2006   GERD 09/06/2006   FATTY LIVER DISEASE 09/06/2006     Discharge Diagnoses:  Patient Active Problem List   Diagnosis Date Noted   S/P CABG x 5 05/12/2022   Chest pain, rule out acute myocardial infarction 05/10/2022   Facial droop 05/10/2022   Unstable angina ([place]) 05/10/2022   Hand paresthesia 04/03/2020   Frequent urination 04/03/2020   Prostate cancer screening 04/03/2020   CAD (coronary artery disease) 04/01/2020   Low back pain 09/18/2013   Screen for STD (sexually transmitted disease) 04/20/2011   TMJ (temporomandibular joint disorder) 12/25/2010   Kidney stones 09/22/2010   Hematuria 09/22/2010   Routine general medical examination at a  health care facility 07/22/2010   TINEA VERSICOLOR 10/29/2009   MEDIAL MENISCUS TEAR, LEFT 05/31/2008   ACL TEAR, LEFT KNEE 05/31/2008   Pain in joint, forearm 05/28/2008   PROBLEMS WITH HEARING 03/18/2008   Prediabetes 07/10/2007   DERMATOPHYTOSIS OF NAIL 04/10/2007   Dyslipidemia, goal LDL below 70 09/06/2006   ANXIETY 09/06/2006   DEPRESSION 09/06/2006   Essential hypertension 09/06/2006   GERD 09/06/2006   FATTY LIVER DISEASE 09/06/2006     Discharged Condition: stable  History of Present Illness:  Glenn Stewart is a 57 year old male with a past medical history of HTN, HLD, NASH, Anxiety and Depression. He presented to the ED on 05/10/22 after calling 911 due to left facial numbness and droop, which resolved by the time the paramedics arrived. He states that 2 nights prior he awoke from his sleep diaphoretic and the following night he again awoke diaphoretic and believes he had a syncopal episode after waking up on the bathroom floor. Over the past couple weeks he has been having intermittent dull chest tightness that resolves on its own which has worsened over the past 2 days. He states he was changing his sheets when he became fatigued and experienced left facial numbness. Upond arrival to the ED his EKG showed no acute changes and his Troponin I (high sensitivity) was 17, 19, and 17. He was ruled out for NSTEMI or STEMI. His MRI brain showed no sign of stroke. Left sided facial droop has fully resolved, possible TIA.  In 2019 he had a CT coronary calcium score of 2076, placing him in the 99th percentile. His follow up stress test was negative and he declined a left heart catheterization due to concerns over the cost. Left heart catheterization on 05/10/22 shows severe 3 vessel disease. The proximal to mid RCA is 100% stenosed, first marginal is 99% stenosed, mid circumflex to distal circumflex is 99% stenoses, ostial LAD to proximal LAD is 70% stenosed, mid LAD is 100% stenoses and the  diagonal is 99% stenosed. His echocardiogram shows inferior basal hypokinesis and LVEF 50-55%. He currently has no chest tightness or facial numbness.    He states he has anxiety and depression but has never been diagnosed with Bipolar disorder. He sees a psychiatrist once every 6 months. He is currently tearful. He lives alone and works at a [employer] ([employer]).  He was evaluated by Dr. Roxan Hockey who felt he would benefit from coronary bypass grafting the risks and benefits of the procedure were explained to the patient and he was agreeable to proceed.  Hospital Course:  Glenn Stewart was taken to the operating room on 05/12/2022.  He underwent CABG x 5 utilizing LIMA to LAD, SVG to Diagonal, Sequential SVG to PD and PL, and Radial artery to OM.  He also underwent left open radial artery harvest and endoscopic harvest of greater saphenous vein from the right thigh.  He tolerated the procedure without difficulty and was taken to the SICU in stable condition. Glenn Stewart, a line, chest tubes, and foley were removed early in his post operative course. He was initially A paced. He was volume overloaded and diuresed. He was started on Imdur on post op day one for radial artery conduit. He was started on Lopressor. He was weaned off the Insulin drip. His pre op HGA1C was 5.4. Accu checks and SS PRN will be stopped after transfer to the floor. He was very anxious. He was given Xanax PRN and already restarted on Zoloft and Trazodone.He was given Toradol to help with pain. He was on Lovenox for DVT prophylaxis. He was felt surgically stable for transfer from the ICU to 4E for further convalescence on 02/23;however, a bed was not available until 02/24. Marland Kitchen He has been tolerating a diet. He has been ambulating on room air with good oxygenation. All wounds are clean, dry, healing without signs of infection.He felt some clicking lateral to sternal wound after being on his side on 02/24. On exam, there was no sternal instability  noted and on CXR, all wires were intact. He was hypertensive on 02/25 so Lisinopril was started for better BP control. He had brief a fib/fl the afternoon of 02/25. He then had a few episodes of increased heart rate (SVT), He was given an Amiodarone bolus followed by drip.  He converted to sinus rhythm and was transitioned to oral Amiodarone. Unfortunately, he went back into a fib with RVT. He was given IV Amiodarone bolus, Lopressor was increased to 50 mg bid, and he was started on Apixaban. He converted to sinus rhythm. As discussed with Dr. Roxan Hockey, he was felt surgically stable for discharge on 02/28.  Consults: None  Significant Diagnostic Studies:  Treatments: surgery:  Median sternotomy, extracorporeal circulation. Coronary artery bypass grafting x5 (left internal mammary artery to LAD, left radial artery to obtuse margi Narrative & Impression  CLINICAL DATA:  [id number] S/P CABG (coronary artery bypass graft) [id number]   EXAM: PORTABLE CHEST 1 VIEW   COMPARISON:  05/11/2022   FINDINGS: Single  frontal view of the chest demonstrates endotracheal tube overlying tracheal air column tip just below thoracic inlet. Enteric catheter passes below diaphragm, tip excluded by collimation but side port projecting over the gastric fundus. Right internal jugular flow directed central venous catheter tip overlies main pulmonary outflow tract. Epicardial pacing wires and mediastinal drain are noted. Chest tube overlies the left lung base.   Cardiac silhouette is unremarkable. Lung volumes are diminished, with bilateral hypoventilatory changes left greater than right. No effusion or pneumothorax. No acute bony abnormalities.   IMPRESSION: 1. Support devices as above. 2. Low lung volumes, with left greater than right basilar atelectasis.     Electronically Signed   By: Randa Ngo M.D.   On: 05/12/2022 14:57    nal 1, saphenous vein graft to first diagonal, sequential saphenous vein graft  to acute marginal and posterolateral). Endoscopic vein harvest right thigh, open left radial artery harvest by Dr. Roxan Hockey on 05/12/2022.  Discharge Exam: Blood pressure (!) 130/93, pulse 79, temperature 98.4 F (36.9 C), temperature source Oral, resp. rate 20, height '5\' 6"'$  (1.676 m), weight 89.7 kg, SpO2 92 %. Cardiovascular: RRR Pulmonary: Mostly clear Abdomen: Soft, non tender, bowel sounds present. Extremities: Trace bilateral lower extremity edema. Motor/senory LUE intact Wounds: Sternal, LUE, and RLE wounds are all clean and dry.  No erythema or signs of infection.   Discharge Medications:  The patient has been discharged on:   1.Beta Blocker:  Yes [  x ]                              No   [   ]                              If No, reason:  2.Ace Inhibitor/ARB: Yes [   ]                                     No  [  x  ]                                     If No, reason:Will consider starting as outpatient once BP improved  3.Statin:   Yes [ x  ]                  No  [   ]                  If No, reason:  4.Ecasa:  Yes  [  x ]                  No   [   ]                  If No, reason:  Patient had ACS upon admission:No  Plavix/P2Y12 inhibitor: Yes [   ]                                      No  [  x ]     Discharge Instructions     Amb Referral to Cardiac Rehabilitation   Complete by: As  directed    Diagnosis: CABG   CABG X ___: 5   After initial evaluation and assessments completed: Virtual Based Care may be provided alone or in conjunction with Phase 2 Cardiac Rehab based on patient barriers.: Yes   Intensive Cardiac Rehabilitation (ICR) [place] location only OR Traditional Cardiac Rehabilitation (TCR) *If criteria for ICR are not met will enroll in TCR [facility] only): Yes      Allergies as of 05/19/2022       Reactions   Sulfonamide Derivatives    REACTION: hives        Medication List     STOP taking these medications    aspirin 81 MG chewable  tablet Replaced by: aspirin EC 81 MG tablet   metoprolol succinate 25 MG 24 hr tablet Commonly known as: TOPROL-XL   nitroGLYCERIN 0.4 MG SL tablet Commonly known as: NITROSTAT   simvastatin 20 MG tablet Commonly known as: ZOCOR       TAKE these medications    acetaminophen 325 MG tablet Commonly known as: TYLENOL Take 2 tablets (650 mg total) by mouth every 4 (four) hours as needed for moderate pain or headache.   ALPRAZolam 1 MG tablet Commonly known as: XANAX Take 1 mg by mouth Once daily as needed for anxiety.   amiodarone 200 MG tablet Commonly known as: PACERONE Take 400 mg by mouth two times daily for 2 days;then take 200 mg by mouth two times daily for one week;then take 200 mg by mouth daily thereafter   amLODipine 5 MG tablet Commonly known as: NORVASC Take 1 tablet (5 mg total) by mouth daily. What changed:  medication strength See the new instructions.   apixaban 5 MG Tabs tablet Commonly known as: ELIQUIS Take 1 tablet (5 mg total) by mouth 2 (two) times daily.   aspirin EC 81 MG tablet Take 1 tablet (81 mg total) by mouth daily. Swallow whole. Replaces: aspirin 81 MG chewable tablet   atorvastatin 80 MG tablet Commonly known as: LIPITOR Take 1 tablet (80 mg total) by mouth daily.   fish oil-omega-3 fatty acids 1000 MG capsule Take 2 g by mouth daily.   furosemide 40 MG tablet Commonly known as: LASIX Take 1 tablet (40 mg total) by mouth daily. For 5 days then stop.   isosorbide mononitrate 30 MG 24 hr tablet Commonly known as: IMDUR Take 1 tablet (30 mg total) by mouth daily.   metoprolol tartrate 50 MG tablet Commonly known as: LOPRESSOR Take 1 tablet (50 mg total) by mouth 2 (two) times daily.   multivitamin capsule Take 1 capsule by mouth daily.   oxyCODONE 5 MG immediate release tablet Commonly known as: Oxy IR/ROXICODONE Take 1 tablet (5 mg total) by mouth every 6 (six) hours as needed for severe pain.   potassium chloride SA 20  MEQ tablet Commonly known as: KLOR-CON M Take 1 tablet (20 mEq total) by mouth daily. For 5 days then stop.   sertraline 100 MG tablet Commonly known as: ZOLOFT Take 200 mg by mouth daily.   tamsulosin 0.4 MG Caps capsule Commonly known as: FLOMAX Take 0.4 mg by mouth daily.   traZODone 50 MG tablet Commonly known as: DESYREL Take 100 mg by mouth at bedtime as needed for sleep.        Follow-up Information     [facility]. Go on 06/22/2022.   Why: PA/LAT CXR to be taken on 04/02. Please arrive by 12:30 pm Contact information: [place]        [name], MD. Go on 06/22/2022.   Specialty: Cardiothoracic Surgery Why: Appointment time is at 2:00 pm Contact information: [place] [address] [phone]         Darreld Mclean, PA-C. Go on 06/08/2022.   Specialty: Cardiology Why: Appointment time is at 10:05 am Contact information: [address] [place] [address] [phone]         Kerin Perna, NP Follow up.   Specialty: Internal Medicine Why: TIME :9:30 AM DATE : [facility] Contact information: [place] [address] [id number]                 Signed:  Nani Skillern, PA-C  05/19/2022, 9:06 AM

## 2022-05-12 NOTE — Anesthesia Procedure Notes (Signed)
Anesthesia Procedure Image    

## 2022-05-12 NOTE — Anesthesia Procedure Notes (Addendum)
Arterial Line Insertion Start/End2/21/2024 8:00 AM Performed by: Janene Harvey, CRNA, CRNA  Preanesthetic checklist: patient identified, IV checked, risks and benefits discussed and monitors and equipment checked Lidocaine 1% used for infiltration Right, radial was placed Catheter size: 20 G Hand hygiene performed   Attempts: 2 Procedure performed without using ultrasound guided technique. Following insertion, dressing applied and Biopatch. Post procedure assessment: unchanged  Patient tolerated the procedure well with no immediate complications. Additional procedure comments: Attempt x1 by SRNA, attempt x1 CRNA (successful).

## 2022-05-12 NOTE — Procedures (Signed)
Extubation Procedure Note  Patient Details:   Name: Glenn Stewart DOB: 09-19-1965 MRN: [id number]   Airway Documentation:  Airway 8 mm (Active)  Secured at (cm) 23 cm 05/12/22 1440  Measured From Lips 05/12/22 1440  Secured Location Right 05/12/22 1440  Secured By [employer] Tape 05/12/22 1440  Tube Holder Repositioned Yes 05/12/22 1440  Prone position No 05/12/22 1440  Site Condition Dry 05/12/22 1440   Vent end date: (not recorded) Vent end time: (not recorded)   Evaluation  O2 sats: stable throughout Complications: No apparent complications Patient did tolerate procedure well. Bilateral Breath Sounds: Clear, Diminished   Yes Pt extubated per protocol without complications, ABG is normal. NIF -30 VC  2.02. no stridor heard. Pt resting on 4l currently with no distress noted. Will continue to monitor.   [place]  Zakya Halabi 05/12/2022, 8:27 PM

## 2022-05-12 NOTE — Anesthesia Procedure Notes (Signed)
Central Venous Catheter Insertion Performed by: Myrtie Soman, MD, anesthesiologist Start/End2/21/2024 8:15 AM, 05/12/2022 8:35 AM Patient location: Pre-op. Preanesthetic checklist: patient identified, IV checked, site marked, risks and benefits discussed, surgical consent, monitors and equipment checked, pre-op evaluation, timeout performed and anesthesia consent Position: Trendelenburg Lidocaine 1% used for infiltration and patient sedated Hand hygiene performed  and maximum sterile barriers used  Catheter size: 9 Fr PA cath was placed.Sheath introducer Swan type:thermodilution PA Cath depth:45 Procedure performed using ultrasound guided technique. Ultrasound Notes:anatomy identified, needle tip was noted to be adjacent to the nerve/plexus identified, no ultrasound evidence of intravascular and/or intraneural injection and image(s) printed for medical record Attempts: 1 Following insertion, line sutured, dressing applied and Biopatch. Post procedure assessment: blood return through all ports, free fluid flow and no air  Patient tolerated the procedure well with no immediate complications.

## 2022-05-12 NOTE — Hospital Course (Addendum)
History of Present Illness:  [name] is a 57 year old male with a past medical history of HTN, HLD, NASH, Anxiety and Depression. He presented to the ED on 05/10/22 after calling 911 due to left facial numbness and droop, which resolved by the time the paramedics arrived. He states that 2 nights prior he awoke from his sleep diaphoretic and the following night he again awoke diaphoretic and believes he had a syncopal episode after waking up on the bathroom floor. Over the past couple weeks he has been having intermittent dull chest tightness that resolves on its own which has worsened over the past 2 days. He states he was changing his sheets when he became fatigued and experienced left facial numbness. Upond arrival to the ED his EKG showed no acute changes and his Troponin I (high sensitivity) was 17, 19, and 17. He was ruled out for NSTEMI or STEMI. His MRI brain showed no sign of stroke. Left sided facial droop has fully resolved, possible TIA. In 2019 he had a CT coronary calcium score of 2076, placing him in the 99th percentile. His follow up stress test was negative and he declined a left heart catheterization due to concerns over the cost. Left heart catheterization on 05/10/22 shows severe 3 vessel disease. The proximal to mid RCA is 100% stenosed, first marginal is 99% stenosed, mid circumflex to distal circumflex is 99% stenoses, ostial LAD to proximal LAD is 70% stenosed, mid LAD is 100% stenoses and the diagonal is 99% stenosed. His echocardiogram shows inferior basal hypokinesis and LVEF 50-55%. He currently has no chest tightness or facial numbness.    He states he has anxiety and depression but has never been diagnosed with Bipolar disorder. He sees a psychiatrist once every 6 months. He is currently tearful. He lives alone and works at a [employer] (life [employer]).  He was evaluated by Dr. Roxan Hockey who felt he would benefit from coronary bypass grafting the risks and benefits of the procedure were  explained to the patient and he was agreeable to proceed.  Hospital Course:  Glenn Stewart was taken to the operating room on 05/12/2022.  He underwent CABG x 5 utilizing LIMA to LAD, SVG to Diagonal, Sequential SVG to PD and PL, and Radial artery to OM.  He also underwent left open radial artery harvest and endoscopic harvest of greater saphenous vein from the right thigh.  He tolerated the procedure without difficulty and was taken to the SICU in stable condition. Glenn Stewart, a line, chest tubes, and foley were removed early in his post operative course. He was initially A paced. He was volume overloaded and diuresed. He was started on Imdur on post op day one for radial artery conduit. He was started on Lopressor. He was weaned off the Insulin drip. His pre op HGA1C was 5.4. Accu checks and SS PRN will be stopped after transfer to the floor. He was very anxious. He was given Xanax PRN and already restarted on Zoloft and Trazodone.He was given Toradol to help with pain. He was on Lovenox for DVT prophylaxis. He was felt surgically stable for transfer from the ICU to 4E for further convalescence on 02/23;however, a bed was not available until 02/24. Marland Kitchen He has been tolerating a diet. He has been ambulating on room air with good oxygenation. All wounds are clean, dry, healing without signs of infection.He felt some clicking lateral to sternal wound after being on his side on 02/24. On exam, there was no sternal instability  noted and on CXR, all wires were intact. He was hypertensive on 02/25 so Lisinopril was started for better BP control. He was felt surgically stable for discharge on 02/**.

## 2022-05-12 NOTE — Op Note (Signed)
NAME: Glenn Stewart, Glenn Stewart MEDICAL RECORD NO: [id number] ACCOUNT NO: [id number] DATE OF BIRTH: 1965-04-29 FACILITY: [facility] LOCATION: [facility] PHYSICIAN: Revonda Standard. Roxan Hockey, MD  Operative Report   DATE OF PROCEDURE: 05/12/2022  PREPROCEDURE DIAGNOSIS:  Severe 3-vessel coronary disease.  POSTOPERATIVE DIAGNOSIS:  Severe 3-vessel coronary disease.  PROCEDURES:   Median sternotomy, extracorporeal circulation.  Coronary artery bypass grafting x5  Left internal mammary artery to LAD,  Left radial artery to obtuse marginal 1,  Saphenous vein graft to first diagonal,  Sequential saphenous vein graft to acute marginal and posterolateral).  Endoscopic vein harvest right thigh Open left radial artery harvest.  SURGEON:  Revonda Standard. Roxan Hockey, MD  ASSISTANT:   Glenn Beckmann, PA  SECOND ASSISTANT:  Glenn Handler, PA  ANESTHESIA:  General.  FINDINGS:  Transesophageal echocardiography showed some inferior hypokinesis, but otherwise preserved left ventricular function, no significant valvular pathology.  No change post-bypass.  Conduits good quality.  LAD and OM good quality vessels at site of anastomosis. Diagonal, acute marginal, posterolateral fair quality vessels.  Posterior descending too small to graft.  CLINICAL NOTE:  Glenn Stewart is a 57 year old gentleman with known coronary disease, who presented after a syncopal event and possible TIA.  Catheterization revealed severe 3-vessel coronary disease.  He was referred for coronary artery bypass grafting.  The indications, risks, benefits, and alternatives were discussed in detail with the patient.  He understood and accepted the risks and agreed to proceed.  He did have a normal Allen's test on the left side and was advised for the plan to use the left radial artery.  OPERATIVE NOTE:  Glenn Stewart was brought to the preoperative holding area on 05/12/2022.  Anesthesia placed a Swan-Ganz catheter and an arterial blood pressure monitoring line.  He  was taken to the operating room and anesthetized and intubated.  A Foley catheter was placed.  Intravenous antibiotics were administered.  The chest, abdomen, legs and left arm were prepped and draped in the usual sterile fashion.  Timeout was performed.  The conduits were harvested simultaneously. Glenn Stewart independently harvested the radial artery as I opened the sternum and harvested the left mammary artery and Glenn Stewart harvested the saphenous vein from the right thigh endoscopically.  An incision was made in the medial aspect of the right leg just below the level of the knee. The greater saphenous vein was identified, and it was harvested endoscopically. The vein was a good quality vessel.  An incision was made on the volar aspect of the left wrist and a short segment of the radial artery was dissected out.  There was a good pulse distally with proximal occlusion and a good Doppler signal in the palmar arch.  The incision then was extended to just below the  antecubital fossa and the radial artery was harvested using the Harmonic scalpel.  Median sternotomy was performed and the left internal mammary artery was harvested under direct vision.  The mammary was a good quality vessel with excellent flow when divided distally. 2000 units of heparin was administered during the vessel harvest.  The remainder of the full heparin dose was given prior to opening the pericardium.  After the conduits had been harvested, the sternal retractor was placed and was gradually opened over time.  The pericardium was opened.  The ascending aorta was inspected and was normal.  After confirming adequate anticoagulation with ACT measurement, the aorta was cannulated via concentric 2-0 Ethibond pledgeted pursestring sutures.  A dual stage venous cannula was placed via a  pursestring suture in right atrial appendage.  Cardiopulmonary bypass was initiated.  Flows were maintained per protocol and the patient was cooled to 32  degrees Celsius.  The coronary arteries were inspected.  Of note, the posterior descending was an extremely small vessel, not suitable for grafting.  The acute marginal and posterolateral branches did appear to be suitable for grafting.  The targets were selected.  The conduits were prepared.  A foam pad was placed in the pericardium to insulate the heart.  A temperature probe was placed in the myocardial septum and a cardioplegia cannula was placed in the ascending aorta.  The aorta was crossclamped.  The left ventricle was emptied via the aortic root vent.  Cardiac arrest then was achieved with combination of cold antegrade blood cardioplegia and topical iced saline. 1.5 liters of cardioplegia was administered.  There was a rapid diastolic arrest and there was septal cooling to 12 degrees Celsius.  A reversed saphenous vein graft was placed sequentially to the acute marginal and then the posterolateral branch.  These were both fair quality vessels, but did accept a 1.5 mm probe.  A side-to-side anastomosis was performed to the acute marginal and end-to-side to the posterolateral, both were done with running 7-0 Prolene sutures.  All anastomoses were probed proximally and distally at their completion.  Cardioplegia was administered down the vein and there was good flow and good hemostasis.  Next, a reversed saphenous vein graft was placed end-to-side to the diagonal.  It was a 1.5 mm vessel and was grafted beyond the bifurcation due to significant plaque at the bifurcation.  The vein was of good quality.  It was anastomosed end-to-side with a running 7-0 Prolene suture.  Again, there was good flow and good hemostasis with cardioplegia administration.  Next, the distal end of the left radial artery was bevelled.  It was anastomosed end-to-side to the first obtuse marginal.  This was the dominant lateral branch.  It was a 1.5 mm vessel, there was good quality at the site of anastomosis, but was diffusely  diseased overall. The radial was anastomosed end-to-side with a running 8-0 Prolene suture.  Probe passed easily proximally and distally.  Cardioplegia was administered down the graft and there was good flow and good hemostasis.  Additional cardioplegia was administered down the aortic root.  The left internal mammary artery was brought through a window in the pericardium.  The distal end was bevelled.  It was anastomosed end-to-side to the distal LAD.  The mammary was a 1.5 mm good quality conduit and the LAD was a good quality vessel at the site of anastomosis and a probe passed easily distally.  There was significant plaque proximal to the anastomosis.  The anastomosis was done with a running 8-0 Prolene suture.  At the completion of the anastomosis, a probe passed easily proximally and distally.  The bulldog clamp was released and septal rewarming was noted.  The bulldog clamp was replaced and the mammary pedicle was tacked to the epicardial surface of the heart with 6-0 Prolene sutures.  Additional cardioplegia was administered.  The cardioplegia cannula then was removed from the ascending aorta.  The vein grafts and radial artery were cut to length and the proximal anastomoses were performed to 4.5 mm punch aortotomies with running 6-0 Prolene sutures for the vein grafts and a running 7-0 Prolene suture for the radial. At the completion of the final proximal anastomosis, the patient was placed in Trendelenburg position.  The bulldog clamp was again  removed from the left mammary artery. Lidocaine was administered.  The aortic root was de-aired and the aortic crossclamp was removed.  The total crossclamp time was 97 minutes.  The patient required two defibrillations with 10 joules and then was in sinus rhythm thereafter.  While rewarming was completed, all proximal and distal anastomoses were inspected for hemostasis.  Epicardial pacing wires were placed on the right ventricle and right atrium.  When the  patient had rewarmed to a core temperature of 37 degrees Celsius, he was atrially paced at 80 beats per minute.  He then weaned from cardiopulmonary bypass on the first attempt without inotropic support.  Total bypass time was 129 minutes.  The initial cardiac index was greater than 2 liters per minute per meter squared and he remained hemodynamically stable throughout the post-bypass period.  A test dose protamine was administered and was well tolerated.  The atrial and aortic cannulae were removed.  Post-bypass transesophageal echocardiography was unchanged from the prebypass study.  The chest was copiously irrigated with warm saline.  Hemostasis was achieved.  The pericardium was reapproximated over the aorta and base of the heart with interrupted 3-0 silk sutures, came together easily without tension.  Left pleural and mediastinal chest tubes were placed through separate subcostal incisions and secured with #1 silk sutures.  The sternum was closed with a combination of single and double heavy gauge stainless steel wires.  The pectoralis fascia, subcutaneous tissue and skin were closed in standard fashion.  The sponge and instrument counts were correct at the end of the procedure.  There was a missing 7-0 Prolene needle from the back table that was not localized.  The plan is to do a chest x-ray in the ICU.  Experienced assistance was necessary for this case.  Glenn Stewart independently harvested the saphenous vein and then provided exposure, retraction of delicate tissues, suture management and suctioning during the anastomoses.  Glenn Stewart independently harvested the left radial artery and closed the arm incision.   VAI D: 05/12/2022 5:13:14 pm T: 05/12/2022 8:23:00 pm  JOB: [id number]

## 2022-05-12 NOTE — Anesthesia Preprocedure Evaluation (Signed)
Anesthesia Evaluation  Patient identified by MRN, date of birth, ID band Patient awake    Reviewed: Allergy & Precautions, H&P , NPO status , Patient's Chart, lab work & pertinent test results  Airway Mallampati: III  TM Distance: <3 FB Neck ROM: Full    Dental no notable dental hx.    Pulmonary neg pulmonary ROS   Pulmonary exam normal breath sounds clear to auscultation       Cardiovascular hypertension, + angina  + CAD  Normal cardiovascular exam Rhythm:Regular Rate:Normal  1. Inferior basal hypokinesis . Left ventricular ejection fraction, by  estimation, is 50 to 55%. The left ventricle has low normal function. The  left ventricle demonstrates regional wall motion abnormalities (see  scoring diagram/findings for  description). Left ventricular diastolic parameters were normal.   2. Right ventricular systolic function is normal. The right ventricular  size is normal.   3. Left atrial size was mildly dilated.   4. The mitral valve is abnormal. Trivial mitral valve regurgitation. No  evidence of mitral stenosis.   5. The aortic valve is tricuspid. There is mild calcification of the  aortic valve. Aortic valve regurgitation is not visualized. Aortic valve  sclerosis is present, with no evidence of aortic valve stenosis.   6. The inferior vena cava is normal in size with greater than 50%  respiratory variability, suggesting right atrial pressure of 3 mmHg.   FINDINGS   Left Ventricle: Inferior basal hypokinesis. Left ventricular ejection  fraction, by estimation, is 50 to 55%. The left ventricle has low normal  function. The left ventricle demonstrates regional wall motion  abnormalities. The left ventricular internal  cavity size was normal in size. There is no left ventricular hypertrophy.  Left ventricular diastolic parameters were normal.   Right Ventricle: The right ventricular size is normal. No increase in  right  ventricular wall thickness. Right ventricular systolic function is  normal.   Left Atrium: Left atrial size was mildly dilated.   Right Atrium: Right atrial size was normal in size.   Pericardium: There is no evidence of pericardial effusion.   Mitral Valve: The mitral valve is abnormal. There is mild thickening of  the mitral valve leaflet(s). Trivial mitral valve regurgitation. No  evidence of mitral valve stenosis.   Tricuspid Valve: The tricuspid valve is normal in structure. Tricuspid  valve regurgitation is not demonstrated. No evidence of tricuspid  stenosis.   Aortic Valve: The aortic valve is tricuspid. There is mild calcification  of the aortic valve. Aortic valve regurgitation is not visualized. Aortic  valve sclerosis is present, with no evidence of aortic valve stenosis.   Pulmonic Valve: The pulmonic valve was normal in structure. Pulmonic valve  regurgitation is not visualized. No evidence of pulmonic stenosis.   Aorta: The aortic root is normal in size and structure.   Venous: The inferior vena cava is normal in size with greater than 50%  respiratory variability, suggesting right atrial pressure of 3 mmHg.   IAS/Shunts: No atrial level shunt detected by color flow Doppler.     Neuro/Psych     Bipolar Disorder   negative neurological ROS     GI/Hepatic negative GI ROS, Neg liver ROS,,,  Endo/Other  negative endocrine ROS    Renal/GU negative Renal ROS  negative genitourinary   Musculoskeletal negative musculoskeletal ROS (+)    Abdominal   Peds negative pediatric ROS (+)  Hematology negative hematology ROS (+)   Anesthesia Other Findings   Reproductive/Obstetrics negative OB ROS  Anesthesia Physical Anesthesia Plan  ASA: 3  Anesthesia Plan: General   Post-op Pain Management:    Induction: Intravenous  PONV Risk Score and Plan: 2 and Treatment may vary due to age or medical  condition  Airway Management Planned: Oral ETT  Additional Equipment: Arterial line, CVP, PA Cath, TEE and Ultrasound Guidance Line Placement  Intra-op Plan:   Post-operative Plan: Post-operative intubation/ventilation  Informed Consent: I have reviewed the patients History and Physical, chart, labs and discussed the procedure including the risks, benefits and alternatives for the proposed anesthesia with the patient or authorized representative who has indicated his/her understanding and acceptance.     Dental advisory given  Plan Discussed with: CRNA and Surgeon  Anesthesia Plan Comments:        Anesthesia Quick Evaluation

## 2022-05-12 NOTE — Progress Notes (Signed)
PROGRESS NOTE        PATIENT DETAILS Name: Glenn Stewart Age: 57 y.o. Sex: male Date of Birth: 31-Mar-1965 Admit Date: 05/09/2022 Admitting Physician Evalee Mutton Kristeen Mans, MD [id number], Wynelle Fanny, MD  Brief Summary: Patient is a 57 y.o.  male with history of HTN, HLD, NASH prior coronary calcium score in the 99th percentile in 2019-who presented with unstable angina, syncope and transient left-sided facial numbness.  He was evaluated by cardiology underwent LHC which showed triple-vessel disease-he has been evaluated by CVTS for CABG.  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 Severe disease in a moderate caliber Diagonal branch that arises prior to the total LAD occlusion.  Large caliber Circumflex with diffuse severe distal stenosis leading into the most distal obtuse marginal branch. (Functional CTO). The first obtuse marginal branch is a moderate caliber vessel with severe ostial stenosis.  The RCA is a large dominant artery with chronic total occlusion of the mid vessel The distal vessel fills from right to right collaterals and left to right collaterals.   Consults: Cardiology CVTS  Subjective: No chest pain overnight.  Significantly less anxious today.  About to go for CABG.  Objective: Vitals: Blood pressure (!) 150/93, pulse 75, temperature 98.3 F (36.8 C), temperature source Oral, resp. rate 18, height 5' 6"$  (1.676 m), weight 89.8 kg, SpO2 94 %.   Exam: Gen Exam:Alert awake-not in any distress HEENT:atraumatic,  normocephalic Chest: B/L clear to auscultation anteriorly CVS:S1S2 regular Abdomen:soft non tender, non distended Extremities:no edema Neurology: Non focal Skin: no rash  Pertinent Labs/Radiology:    Latest Ref Rng & Units 05/12/2022    3:33 AM 05/11/2022    2:31 AM 05/09/2022    6:02 PM  CBC  WBC 4.0 - 10.5 K/uL 7.6  7.9  6.5   Hemoglobin 13.0 - 17.0 g/dL 15.6  15.8  16.2   Hematocrit 39.0 - 52.0 % 44.9  44.4  45.4   Platelets 150 - 400 K/uL 252  233  259     Lab Results  Component Value Date   NA 138 05/11/2022   K 3.4 (L) 05/11/2022   CL 104 05/11/2022   CO2 23 05/11/2022      Assessment/Plan: Unstable angina Severe triple-vessel disease on LHC 2/20 No chest pain overnight Continue IV heparin/aspirin/beta-blocker/statin/nitrates To OR today for CABG.  Left Facial numbness ?TIA or related to angina/chest pain Resoved-no further episodes since admission MRI brain negative for CVA Workup as above Continue aspirin/statin.   HTN Stable Continue amlodipine/beta-blocker/nitrate  HLD Lipitor 40 mg daily  Mood disorder Anxiety disorder Anxiety better controlled Zoloft/as needed Xanax.   BPH Flomax  Obesity: Estimated body mass index is 31.96 kg/m as calculated from the following:   Height as of this encounter: 5' 6"$  (1.676 m).   Weight as of this encounter: 89.8 kg.   Code status:   Code Status: Full Code   DVT Prophylaxis: IV heparin   Family Communication: None at bedside   Disposition Plan:  Status is: Inpatient given severity of illness.   Planned Discharge Destination:Home   Diet: Diet Order             Diet NPO time specified  Diet effective midnight                     Antimicrobial agents: Anti-infectives (From admission, onward)    Start     Dose/Rate Route Frequency Ordered Stop   05/12/22 0400  vancomycin (VANCOREADY) IVPB 1500 mg/300 mL        1,500 mg 150 mL/hr over 120 Minutes Intravenous To Surgery 05/11/22 1335  05/13/22 0400   05/12/22 0400  ceFAZolin (ANCEF) IVPB 2g/100 mL premix        2 g 200 mL/hr over 30 Minutes Intravenous To Surgery 05/11/22 1335 05/13/22 0400   05/12/22 0400  ceFAZolin (ANCEF) IVPB 2g/100 mL premix        2 g 200 mL/hr over 30 Minutes Intravenous To Surgery 05/11/22 1335 05/13/22 0400        MEDICATIONS: Scheduled Meds:  amLODipine  10 mg Oral Daily   aspirin  81 mg Oral Daily   atorvastatin  40 mg Oral Daily   Chlorhexidine Gluconate Cloth  6 each Topical Daily   epinephrine  0-10 mcg/min Intravenous To OR   heparin sodium (porcine) 2,500 Units, papaverine 30 mg in electrolyte-A (PLASMALYTE-A PH 7.4) 500 mL irrigation   Irrigation To OR   insulin   Intravenous To OR   isosorbide mononitrate  30 mg Oral Daily   magnesium sulfate  40 mEq Other To OR   metoprolol succinate  25 mg Oral Daily   multivitamin with minerals  1 tablet Oral Daily   mupirocin ointment  1 Application Nasal BID   omega-3 acid ethyl esters  2 g Oral Daily   phenylephrine  30-200 mcg/min Intravenous To OR   potassium chloride  80 mEq Other To OR   sertraline  200 mg Oral Daily   sodium chloride flush  3 mL Intravenous Q12H   tamsulosin  0.4 mg Oral Daily   tranexamic acid  15 mg/kg Intravenous To OR   tranexamic acid  2 mg/kg Intracatheter To OR   Continuous Infusions:  sodium chloride      ceFAZolin (ANCEF) IV      ceFAZolin (ANCEF) IV     dexmedetomidine     heparin 30,000 units/NS 1000 mL solution for CELLSAVER     heparin 1,350 Units/hr (05/12/22 0635)   milrinone     nitroGLYCERIN     norepinephrine     tranexamic acid (CYKLOKAPRON) 2,500 mg in sodium chloride 0.9 % 250 mL (10 mg/mL) infusion     vancomycin     PRN Meds:.sodium chloride, acetaminophen, ALPRAZolam, ondansetron (ZOFRAN) IV, sodium chloride flush, traZODone   I have personally reviewed following labs and imaging studies  LABORATORY DATA: CBC: Recent Labs  Lab 05/09/22 1802 05/11/22 0231 05/12/22 0333   WBC 6.5 7.9 7.6  HGB 16.2 15.8 15.6  HCT 45.4 44.4 44.9  MCV 80.2 80.4 80.9  PLT 259 233 252     Basic Metabolic Panel: Recent Labs  Lab 05/09/22 1802 05/11/22 0231  NA 137 138  K 3.6 3.4*  CL 105 104  CO2 19* 23  GLUCOSE 112* 95  BUN 15 13  CREATININE 0.94 1.02  CALCIUM 9.6 9.3     GFR: Estimated Creatinine Clearance: 83.9 mL/min (by C-G formula based on SCr of 1.02 mg/dL).  Liver  Function Tests: Recent Labs  Lab 05/11/22 1010  AST 23  ALT 26  ALKPHOS 51  BILITOT 1.3*  PROT 7.4  ALBUMIN 4.3   No results for input(s): "LIPASE", "AMYLASE" in the last 168 hours. No results for input(s): "AMMONIA" in the last 168 hours.  Coagulation Profile: Recent Labs  Lab 05/10/22 0350  INR 1.0     Cardiac Enzymes: No results for input(s): "CKTOTAL", "CKMB", "CKMBINDEX", "TROPONINI" in the last 168 hours.  BNP (last 3 results) No results for input(s): "PROBNP" in the last 8760 hours.  Lipid Profile: Recent Labs    05/10/22 0505  CHOL 182  HDL 38*  LDLCALC 108*  TRIG 180*  CHOLHDL 4.8     Thyroid Function Tests: No results for input(s): "TSH", "T4TOTAL", "FREET4", "T3FREE", "THYROIDAB" in the last 72 hours.  Anemia Panel: No results for input(s): "VITAMINB12", "FOLATE", "FERRITIN", "TIBC", "IRON", "RETICCTPCT" in the last 72 hours.  Urine analysis:    Component Value Date/Time   COLORURINE YELLOW 05/11/2022 2236   APPEARANCEUR CLEAR 05/11/2022 2236   LABSPEC 1.018 05/11/2022 2236   PHURINE 5.0 05/11/2022 2236   GLUCOSEU NEGATIVE 05/11/2022 2236   HGBUR NEGATIVE 05/11/2022 2236   HGBUR negative 01/18/2009 0000   BILIRUBINUR NEGATIVE 05/11/2022 2236   BILIRUBINUR Negative 04/03/2020 1109   KETONESUR NEGATIVE 05/11/2022 2236   PROTEINUR NEGATIVE 05/11/2022 2236   UROBILINOGEN 0.2 04/03/2020 1109   UROBILINOGEN 0.2 01/07/2014 0616   NITRITE NEGATIVE 05/11/2022 2236   LEUKOCYTESUR NEGATIVE 05/11/2022 2236    Sepsis Labs: Lactic Acid, Venous     Component Value Date/Time   LATICACIDVEN 0.9 09/29/2020 1945    MICROBIOLOGY: Recent Results (from the past 240 hour(s))  MRSA Next Gen by PCR, Nasal     Status: None   Collection Time: 05/11/22  5:30 PM   Specimen: Nasal Mucosa; Nasal Swab  Result Value Ref Range Status   MRSA by PCR Next Gen NOT DETECTED NOT DETECTED Final    Comment: (NOTE) The GeneXpert MRSA Assay (FDA approved for NASAL specimens only), is one component of a comprehensive MRSA colonization surveillance program. It is not intended to diagnose MRSA infection nor to guide or monitor treatment for MRSA infections. Test performance is not FDA approved in patients less than 35 years old. Performed at [facility], [place] [address] [place], [place] [address]   Surgical pcr screen     Status: Abnormal   Collection Time: 05/11/22 10:10 PM   Specimen: Nasal Mucosa; Nasal Swab  Result Value Ref Range Status   MRSA, PCR NEGATIVE NEGATIVE Final   Staphylococcus aureus POSITIVE (A) NEGATIVE Final    Comment: (NOTE) The Xpert SA Assay (FDA approved for NASAL specimens in patients 30 years of age and older), is one component of a comprehensive surveillance program. It is not intended to diagnose infection nor to guide or monitor treatment. Performed at [facility], [place] [address] [place], [place] [address]     RADIOLOGY STUDIES/RESULTS: VAS US DOPPLER PRE CABG  Result Date: 05/11/2022 PREOPERATIVE VASCULAR EVALUATION Patient Name:  JANET SZWED  Date of Exam:   05/11/2022 Medical Rec #: [id number]         Accession #:    [id number] Date of Birth: Oct 18, 1965          Patient Gender: M Patient Age:   30 years Exam Location:  [facility] Procedure:      VAS US DOPPLER PRE CABG Referring Phys: Remo Lipps HENDRICKSON --------------------------------------------------------------------------------  Indications:      TIA and pre-CABG. Risk Factors:     Hypertension, hyperlipidemia. Comparison Study: Carotid  study performed 05/10/2022.                   No previous ABI or Allen's Test. Performing Technologist: Candie Chroman  Examination Guidelines: A complete evaluation includes B-mode imaging, spectral Doppler, color Doppler, and power Doppler as needed of all accessible portions of each vessel. Bilateral testing is considered an integral part of a complete examination. Limited examinations for reoccurring indications may be performed as noted.  ABI Findings: +---------+------------------+-----+---------+--------+ Right    Rt Pressure (mmHg)IndexWaveform Comment  +---------+------------------+-----+---------+--------+ Brachial 141                    triphasic         +---------+------------------+-----+---------+--------+ PTA      182               1.28 triphasic         +---------+------------------+-----+---------+--------+ DP       132               0.93 biphasic          +---------+------------------+-----+---------+--------+ Great Toe142               1.00 Normal            +---------+------------------+-----+---------+--------+ +---------+------------------+-----+---------+-------+ Left     Lt Pressure (mmHg)IndexWaveform Comment +---------+------------------+-----+---------+-------+ Brachial 142                    triphasic        +---------+------------------+-----+---------+-------+ PTA      184               1.30 triphasic        +---------+------------------+-----+---------+-------+ DP       165               1.16 biphasic         +---------+------------------+-----+---------+-------+ Great Toe139               0.98 Normal           +---------+------------------+-----+---------+-------+ +-------+---------------+----------------+ ABI/TBIToday's ABI/TBIPrevious ABI/TBI +-------+---------------+----------------+ Right  1.28                            +-------+---------------+----------------+ Left   1.30                             +-------+---------------+----------------+  Right Doppler Findings: +--------+--------+-----+---------+--------+ Site    PressureIndexDoppler  Comments +--------+--------+-----+---------+--------+ [id number]          triphasic         +--------+--------+-----+---------+--------+ Radial               triphasic         +--------+--------+-----+---------+--------+ Ulnar                triphasic         +--------+--------+-----+---------+--------+  Left Doppler Findings: +--------+--------+-----+---------+--------+ Site    PressureIndexDoppler  Comments +--------+--------+-----+---------+--------+ [id number]          triphasic         +--------+--------+-----+---------+--------+ Radial               triphasic         +--------+--------+-----+---------+--------+ Ulnar  triphasic         +--------+--------+-----+---------+--------+   Right ABI: Resting right ankle-brachial index is within normal range. The right toe-brachial index is normal. Left ABI: Resting left ankle-brachial index is within normal range. The left toe-brachial index is normal. Right Upper Extremity: Doppler waveforms decrease >50% with right radial compression. Doppler waveforms remain within normal limits with right ulnar compression. Left Upper Extremity: Doppler waveforms decrease <50% w left radial compression. Doppler waveform obliterate with left ulnar compression.  Electronically signed by Deitra Mayo MD on 05/11/2022 at 6:02:05 PM.    Final    DG Chest Port 1 View  Result Date: 05/11/2022 CLINICAL DATA:  Chest pain, preoperative evaluation for CABG EXAM: PORTABLE CHEST 1 VIEW COMPARISON:  Portable exam 1002 hours compared to 03/20/2020 FINDINGS: Normal heart size, mediastinal contours, and pulmonary vascularity. Atherosclerotic calcification aorta. Lungs clear. No pulmonary infiltrate, pleural effusion, or pneumothorax. Osseous structures unremarkable. IMPRESSION: No  acute abnormalities. Electronically Signed   By: Lavonia Dana M.D.   On: 05/11/2022 10:43   CARDIAC CATHETERIZATION  Result Date: 05/10/2022   Prox RCA to Mid RCA lesion is 100% stenosed.   1st Mrg lesion is 99% stenosed.   Mid Cx to Dist Cx lesion is 99% stenosed.   Ost LAD to Prox LAD lesion is 70% stenosed.   Mid LAD lesion is 100% stenosed.   1st Diag lesion is 99% stenosed. 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 Severe disease in a moderate caliber Diagonal branch that arises prior to the total LAD occlusion. Large caliber Circumflex with diffuse severe distal stenosis leading into the most distal obtuse marginal branch. (Functional CTO). The first obtuse marginal branch is a moderate caliber vessel with severe ostial stenosis. The RCA is a large dominant artery with chronic total occlusion of the mid vessel The distal vessel fills from right to right collaterals and left to right collaterals. Recommendations; He has severe three vessel CAD with chronic occlusion of the mid RCA, mid LAD and distal Circumflex with severe disease in the early obtuse marginal branch and in the diagonal branch. Bypass surgery appears to be the best method for revascularization. Will ask CT surgery to see him today to discuss bypass. Will resume IV heparin 2 hours post sheath pull. Continue ASA, statin and beta blocker.   ECHOCARDIOGRAM COMPLETE  Result Date: 05/10/2022    ECHOCARDIOGRAM REPORT   Patient Name:   MCCABE MARKOSKI Date of Exam: 05/10/2022 Medical Rec #:  [id number]        Height:       66.0 in Accession #:    [id number]       Weight:       216.6 lb Date of Birth:  1965-12-26         BSA:          2.069 m Patient Age:    33 years         BP:           148/100 mmHg Patient Gender: M                HR:           61 bpm. Exam Location:  Inpatient Procedure: 2D Echo, Color Doppler and Cardiac Doppler Indications:    TIA  History:         Patient has no prior history of Echocardiogram examinations.  CAD; Risk Factors:Hypertension and Dyslipidemia.  Sonographer:    Raquel Sarna Senior RDCS Referring Phys: [place]  1. Inferior basal hypokinesis . Left ventricular ejection fraction, by estimation, is 50 to 55%. The left ventricle has low normal function. The left ventricle demonstrates regional wall motion abnormalities (see scoring diagram/findings for description). Left ventricular diastolic parameters were normal.  2. Right ventricular systolic function is normal. The right ventricular size is normal.  3. Left atrial size was mildly dilated.  4. The mitral valve is abnormal. Trivial mitral valve regurgitation. No evidence of mitral stenosis.  5. The aortic valve is tricuspid. There is mild calcification of the aortic valve. Aortic valve regurgitation is not visualized. Aortic valve sclerosis is present, with no evidence of aortic valve stenosis.  6. The inferior vena cava is normal in size with greater than 50% respiratory variability, suggesting right atrial pressure of 3 mmHg. FINDINGS  Left Ventricle: Inferior basal hypokinesis. Left ventricular ejection fraction, by estimation, is 50 to 55%. The left ventricle has low normal function. The left ventricle demonstrates regional wall motion abnormalities. The left ventricular internal cavity size was normal in size. There is no left ventricular hypertrophy. Left ventricular diastolic parameters were normal. Right Ventricle: The right ventricular size is normal. No increase in right ventricular wall thickness. Right ventricular systolic function is normal. Left Atrium: Left atrial size was mildly dilated. Right Atrium: Right atrial size was normal in size. Pericardium: There is no evidence of pericardial effusion. Mitral Valve: The mitral valve is abnormal. There is mild thickening of the mitral valve leaflet(s). Trivial mitral valve regurgitation. No evidence of  mitral valve stenosis. Tricuspid Valve: The tricuspid valve is normal in structure. Tricuspid valve regurgitation is not demonstrated. No evidence of tricuspid stenosis. Aortic Valve: The aortic valve is tricuspid. There is mild calcification of the aortic valve. Aortic valve regurgitation is not visualized. Aortic valve sclerosis is present, with no evidence of aortic valve stenosis. Pulmonic Valve: The pulmonic valve was normal in structure. Pulmonic valve regurgitation is not visualized. No evidence of pulmonic stenosis. Aorta: The aortic root is normal in size and structure. Venous: The inferior vena cava is normal in size with greater than 50% respiratory variability, suggesting right atrial pressure of 3 mmHg. IAS/Shunts: No atrial level shunt detected by color flow Doppler.  LEFT VENTRICLE PLAX 2D LVIDd:         5.70 cm      Diastology LVIDs:         4.30 cm      LV e' medial:    7.72 cm/s LV PW:         0.80 cm      LV E/e' medial:  9.2 LV IVS:        0.90 cm      LV e' lateral:   13.20 cm/s LVOT diam:     2.30 cm      LV E/e' lateral: 5.4 LV SV:         80 LV SV Index:   39 LVOT Area:     4.15 cm  LV Volumes (MOD) LV vol d, MOD A2C: 120.0 ml LV vol d, MOD A4C: 116.0 ml LV vol s, MOD A2C: 51.8 ml LV vol s, MOD A4C: 60.1 ml LV SV MOD A2C:     68.2 ml LV SV MOD A4C:     116.0 ml LV SV MOD BP:      61.7 ml RIGHT VENTRICLE RV S prime:  14.60 cm/s TAPSE (M-mode): 1.7 cm LEFT ATRIUM             Index        RIGHT ATRIUM           Index LA diam:        3.90 cm 1.89 cm/m   RA Area:     15.80 cm LA Vol (A2C):   51.7 ml 24.99 ml/m  RA Volume:   33.40 ml  16.15 ml/m LA Vol (A4C):   42.7 ml 20.64 ml/m LA Biplane Vol: 48.2 ml 23.30 ml/m  AORTIC VALVE LVOT Vmax:   90.20 cm/s LVOT Vmean:  67.800 cm/s LVOT VTI:    0.193 m  AORTA Ao Root diam: 3.50 cm Ao Asc diam:  3.50 cm MITRAL VALVE MV Area (PHT): 3.33 cm    SHUNTS MV Decel Time: 228 msec    Systemic VTI:  0.19 m MV E velocity: 71.30 cm/s  Systemic Diam: 2.30  cm MV A velocity: 67.10 cm/s MV E/A ratio:  1.06 Jenkins Rouge MD Electronically signed by Jenkins Rouge MD Signature Date/Time: 05/10/2022/11:34:55 AM    Final    VAS US CAROTID  Result Date: 05/10/2022 Carotid Arterial Duplex Study Patient Name:  DERRICO DACRUZ  Date of Exam:   05/10/2022 Medical Rec #: [id number]         Accession #:    [id number] Date of Birth: 02-May-1965          Patient Gender: M Patient Age:   76 years Exam Location:  [facility] Procedure:      VAS US CAROTID Referring Phys: Oren Binet --------------------------------------------------------------------------------  Indications:       Multiple syncopal episodes. Risk Factors:      Hypertension, hyperlipidemia. Other Factors:     Chest pain, NASH, right facial droop that quickly resolved. Comparison Study:  No prior study on file Performing Technologist: Sharion Dove RVS  Examination Guidelines: A complete evaluation includes B-mode imaging, spectral Doppler, color Doppler, and power Doppler as needed of all accessible portions of each vessel. Bilateral testing is considered an integral part of a complete examination. Limited examinations for reoccurring indications may be performed as noted.  Right Carotid Findings: +----------+--------+--------+--------+------------------+------------------+           PSV cm/sEDV cm/sStenosisPlaque DescriptionComments           +----------+--------+--------+--------+------------------+------------------+ CCA Prox  90      16                                intimal thickening +----------+--------+--------+--------+------------------+------------------+ CCA Distal80      17                                intimal thickening +----------+--------+--------+--------+------------------+------------------+ ICA Prox  83      25      1-39%   calcific          Shadowing          +----------+--------+--------+--------+------------------+------------------+ ICA Mid   64      22                                                    +----------+--------+--------+--------+------------------+------------------+ ICA Distal69  28                                                   +----------+--------+--------+--------+------------------+------------------+ ECA       91      10                                                   +----------+--------+--------+--------+------------------+------------------+ +----------+--------+-------+--------+-------------------+           PSV cm/sEDV cmsDescribeArm Pressure (mmHG) +----------+--------+-------+--------+-------------------+ Subclavian102                                        +----------+--------+-------+--------+-------------------+ +---------+--------+--+--------+-+ VertebralPSV cm/s15EDV cm/s3 +---------+--------+--+--------+-+  Left Carotid Findings: +----------+--------+--------+--------+------------------+------------------+           PSV cm/sEDV cm/sStenosisPlaque DescriptionComments           +----------+--------+--------+--------+------------------+------------------+ CCA Prox  119     23                                intimal thickening +----------+--------+--------+--------+------------------+------------------+ CCA Distal100     20                                intimal thickening +----------+--------+--------+--------+------------------+------------------+ ICA Prox  59      20      1-39%   heterogenous      Shadowing          +----------+--------+--------+--------+------------------+------------------+ ICA Mid   65      26                                                   +----------+--------+--------+--------+------------------+------------------+ ICA Distal65      27                                                   +----------+--------+--------+--------+------------------+------------------+ ECA       110     17                                                    +----------+--------+--------+--------+------------------+------------------+ +----------+--------+--------+--------+-------------------+           PSV cm/sEDV cm/sDescribeArm Pressure (mmHG) +----------+--------+--------+--------+-------------------+ Subclavian131                                         +----------+--------+--------+--------+-------------------+ +---------+--------+--+--------+--+ VertebralPSV cm/s65EDV cm/s18 +---------+--------+--+--------+--+   Summary: Right Carotid: Velocities in the right ICA are consistent with a 1-39% stenosis. Left Carotid: Velocities in the  left ICA are consistent with a 1-39% stenosis. Vertebrals:  Bilateral vertebral arteries demonstrate antegrade flow. Subclavians: Normal flow hemodynamics were seen in bilateral subclavian              arteries. *See table(s) above for measurements and observations.  Electronically signed by Servando Snare MD on 05/10/2022 at 10:46:18 AM.    Final    MR BRAIN WO CONTRAST  Result Date: 05/10/2022 CLINICAL DATA:  TIA.  Syncope after night sweats. EXAM: MRI HEAD WITHOUT CONTRAST TECHNIQUE: Multiplanar, multiecho pulse sequences of the brain and surrounding structures were obtained without intravenous contrast. COMPARISON:  Head CT from earlier today FINDINGS: Brain: No acute infarction, hemorrhage, hydrocephalus, extra-axial collection or mass lesion. Few small FLAIR hyperintensities scattered in the cerebral white matter, usually related to chronic small vessel ischemia especially given patient's small-vessel risk factors. Vascular: Major flow voids are preserved Skull and upper cervical spine: Normal marrow signal Sinuses/Orbits: Negative IMPRESSION: 1. No acute or reversible finding. 2. Few remote white matter insults, usually chronic microvascular ischemia. Electronically Signed   By: Jorje Guild M.D.   On: 05/10/2022 07:58     LOS: 2 days   Oren Binet, MD  [facility]    To  contact the attending provider between 7A-7P or the covering provider during after hours 7P-7A, please log into the web site [email or web address] and access using universal [place] password for that web site. If you do not have the password, please call the hospital operator.  05/12/2022, 7:11 AM

## 2022-05-12 NOTE — Progress Notes (Signed)
Cardiologist:  Stanford Breed  Subjective:  Patient seen prior to going for CABG Anxious no angina    Objective:  Vitals:   05/12/22 0839 05/12/22 0840 05/12/22 0841 05/12/22 0842  BP:  (!) 144/96    Pulse: 64 64 70 77  Resp: 19 17 13 14  $ Temp:      TempSrc:      SpO2: 97% 97% 96% 97%  Weight:      Height:        Intake/Output from previous day:  Intake/Output Summary (Last 24 hours) at 05/12/2022 1312 Last data filed at 05/12/2022 1308 Gross per 24 hour  Intake 2233.98 ml  Output 850 ml  Net 1383.98 ml    Physical Exam: Affect appropriate Healthy:  appears stated age HEENT: normal Neck supple with no adenopathy JVP normal no bruits no thyromegaly Lungs clear with no wheezing and good diaphragmatic motion Heart:  S1/S2 no murmur, no rub, gallop or click PMI normal Abdomen: benighn, BS positve, no tenderness, no AAA no bruit.  No HSM or HJR Distal pulses intact with no bruits No edema Neuro non-focal Skin warm and dry No muscular weakness   Lab Results: Basic Metabolic Panel: Recent Labs    05/09/22 1802 05/11/22 0231 05/12/22 0916 05/12/22 1149 05/12/22 1249 05/12/22 1309  NA 137 138   < > 138 139 138  K 3.6 3.4*   < > 4.7 5.2* 5.1  CL 105 104   < > 104 104  --   CO2 19* 23  --   --   --   --   GLUCOSE 112* 95   < > 98 119*  --   BUN 15 13   < > 17 16  --   CREATININE 0.94 1.02   < > 0.70 0.70  --   CALCIUM 9.6 9.3  --   --   --   --    < > = values in this interval not displayed.   Liver Function Tests: Recent Labs    05/11/22 1010  AST 23  ALT 26  ALKPHOS 51  BILITOT 1.3*  PROT 7.4  ALBUMIN 4.3   No results for input(s): "LIPASE", "AMYLASE" in the last 72 hours. CBC: Recent Labs    05/11/22 0231 05/12/22 0333 05/12/22 0916 05/12/22 1225 05/12/22 1249 05/12/22 1309  WBC 7.9 7.6  --   --   --   --   HGB 15.8 15.6   < > 11.3* 11.2* 10.5*  HCT 44.4 44.9   < > 31.8* 33.0* 31.0*  MCV 80.4 80.9  --   --   --   --   PLT 233 252  --   166  --   --    < > = values in this interval not displayed.   Cardiac Enzymes: No results for input(s): "CKTOTAL", "CKMB", "CKMBINDEX", "TROPONINI" in the last 72 hours. BNP: Invalid input(s): "POCBNP" D-Dimer: No results for input(s): "DDIMER" in the last 72 hours. Hemoglobin A1C: Recent Labs    05/10/22 1136  HGBA1C 5.4   Fasting Lipid Panel: Recent Labs    05/10/22 0505  CHOL 182  HDL 38*  LDLCALC 108*  TRIG 180*  CHOLHDL 4.8   Thyroid Function Tests: No results for input(s): "TSH", "T4TOTAL", "T3FREE", "THYROIDAB" in the last 72 hours.  Invalid input(s): "FREET3" Anemia Panel: No results for input(s): "VITAMINB12", "FOLATE", "FERRITIN", "TIBC", "IRON", "RETICCTPCT" in the last 72 hours.  Imaging: VAS US DOPPLER PRE CABG  Result Date:  05/11/2022 PREOPERATIVE VASCULAR EVALUATION Patient Name:  ROWAN BOUWKAMP  Date of Exam:   05/11/2022 Medical Rec #: [id number]         Accession #:    [id number] Date of Birth: 1965/12/31          Patient Gender: M Patient Age:   57 years Exam Location:  [facility] Procedure:      VAS US DOPPLER PRE CABG Referring Phys: Remo Lipps HENDRICKSON --------------------------------------------------------------------------------  Indications:      TIA and pre-CABG. Risk Factors:     Hypertension, hyperlipidemia. Comparison Study: Carotid study performed 05/10/2022.                   No previous ABI or Allen's Test. Performing Technologist: Candie Chroman  Examination Guidelines: A complete evaluation includes B-mode imaging, spectral Doppler, color Doppler, and power Doppler as needed of all accessible portions of each vessel. Bilateral testing is considered an integral part of a complete examination. Limited examinations for reoccurring indications may be performed as noted.  ABI Findings: +---------+------------------+-----+---------+--------+ Right    Rt Pressure (mmHg)IndexWaveform Comment   +---------+------------------+-----+---------+--------+ Brachial 141                    triphasic         +---------+------------------+-----+---------+--------+ PTA      182               1.28 triphasic         +---------+------------------+-----+---------+--------+ DP       132               0.93 biphasic          +---------+------------------+-----+---------+--------+ Great Toe142               1.00 Normal            +---------+------------------+-----+---------+--------+ +---------+------------------+-----+---------+-------+ Left     Lt Pressure (mmHg)IndexWaveform Comment +---------+------------------+-----+---------+-------+ Brachial 142                    triphasic        +---------+------------------+-----+---------+-------+ PTA      184               1.30 triphasic        +---------+------------------+-----+---------+-------+ DP       165               1.16 biphasic         +---------+------------------+-----+---------+-------+ Great Toe139               0.98 Normal           +---------+------------------+-----+---------+-------+ +-------+---------------+----------------+ ABI/TBIToday's ABI/TBIPrevious ABI/TBI +-------+---------------+----------------+ Right  1.28                            +-------+---------------+----------------+ Left   1.30                            +-------+---------------+----------------+  Right Doppler Findings: +--------+--------+-----+---------+--------+ Site    PressureIndexDoppler  Comments +--------+--------+-----+---------+--------+ [id number]          triphasic         +--------+--------+-----+---------+--------+ Radial               triphasic         +--------+--------+-----+---------+--------+ Ulnar                triphasic         +--------+--------+-----+---------+--------+  Left Doppler Findings: +--------+--------+-----+---------+--------+ Site    PressureIndexDoppler   Comments +--------+--------+-----+---------+--------+ [id number]          triphasic         +--------+--------+-----+---------+--------+ Radial               triphasic         +--------+--------+-----+---------+--------+ Ulnar                triphasic         +--------+--------+-----+---------+--------+   Right ABI: Resting right ankle-brachial index is within normal range. The right toe-brachial index is normal. Left ABI: Resting left ankle-brachial index is within normal range. The left toe-brachial index is normal. Right Upper Extremity: Doppler waveforms decrease >50% with right radial compression. Doppler waveforms remain within normal limits with right ulnar compression. Left Upper Extremity: Doppler waveforms decrease <50% w left radial compression. Doppler waveform obliterate with left ulnar compression.  Electronically signed by Deitra Mayo MD on 05/11/2022 at 6:02:05 PM.    Final    DG Chest Port 1 View  Result Date: 05/11/2022 CLINICAL DATA:  Chest pain, preoperative evaluation for CABG EXAM: PORTABLE CHEST 1 VIEW COMPARISON:  Portable exam 1002 hours compared to 03/20/2020 FINDINGS: Normal heart size, mediastinal contours, and pulmonary vascularity. Atherosclerotic calcification aorta. Lungs clear. No pulmonary infiltrate, pleural effusion, or pneumothorax. Osseous structures unremarkable. IMPRESSION: No acute abnormalities. Electronically Signed   By: Lavonia Dana M.D.   On: 05/11/2022 10:43   CARDIAC CATHETERIZATION  Result Date: 05/10/2022   Prox RCA to Mid RCA lesion is 100% stenosed.   1st Mrg lesion is 99% stenosed.   Mid Cx to Dist Cx lesion is 99% stenosed.   Ost LAD to Prox LAD lesion is 70% stenosed.   Mid LAD lesion is 100% stenosed.   1st Diag lesion is 99% stenosed. 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 Severe disease in a moderate caliber Diagonal branch that arises  prior to the total LAD occlusion. Large caliber Circumflex with diffuse severe distal stenosis leading into the most distal obtuse marginal branch. (Functional CTO). The first obtuse marginal branch is a moderate caliber vessel with severe ostial stenosis. The RCA is a large dominant artery with chronic total occlusion of the mid vessel The distal vessel fills from right to right collaterals and left to right collaterals. Recommendations; He has severe three vessel CAD with chronic occlusion of the mid RCA, mid LAD and distal Circumflex with severe disease in the early obtuse marginal branch and in the diagonal branch. Bypass surgery appears to be the best method for revascularization. Will ask CT surgery to see him today to discuss bypass. Will resume IV heparin 2 hours post sheath pull. Continue ASA, statin and beta blocker.    Cardiac Studies:  ECG: NSR ? Old IMI    Telemetry:  NSR   Echo: EF 50-55%   Medications:    [MAR Hold] amLODipine  10 mg Oral Daily   [MAR Hold] aspirin  81 mg Oral Daily   [MAR Hold] atorvastatin  40 mg Oral Daily   [MAR Hold] Chlorhexidine Gluconate Cloth  6 each Topical Daily   epinephrine  0-10 mcg/min Intravenous To OR   heparin sodium (porcine) 2,500 Units, papaverine 30 mg in electrolyte-A (PLASMALYTE-A PH 7.4) 500 mL irrigation   Irrigation To OR   [MAR Hold] isosorbide mononitrate  30 mg Oral Daily   magnesium sulfate  40 mEq Other To OR   [MAR Hold] metoprolol succinate  25 mg Oral Daily   [MAR Hold] multivitamin with minerals  1 tablet Oral Daily   [MAR Hold] mupirocin ointment  1 Application Nasal BID   [MAR Hold] omega-3 acid ethyl esters  2 g Oral Daily   potassium chloride  80 mEq Other To OR   [MAR Hold] sertraline  200 mg Oral Daily   [MAR Hold] sodium chloride flush  3 mL Intravenous Q12H   [MAR Hold] tamsulosin  0.4 mg Oral Daily   tranexamic acid  2 mg/kg Intracatheter To OR      [MAR Hold] sodium chloride      ceFAZolin (ANCEF) IV      DOBUTamine     heparin 30,000 units/NS 1000 mL solution for CELLSAVER     heparin 1,350 Units/hr (05/12/22 [id number])   milrinone     norepinephrine      Assessment/Plan:   CAD:  cath with severe 3 vessel CAD Essentially CTO of LAD/RCA and high grade LCX dx Good distal vessels for bypass. Carotids with plaque no stenosis TIA ? Symptoms on admission negative MRI.  Plan to use LIMA and left radial for grafts TTE with EF 50-55% Pre CABG dopplers with plaque no ICA stenosis Dr Roxan Hockey to operate on this am   Jenkins Rouge 05/12/2022, 1:12 PM

## 2022-05-12 NOTE — Progress Notes (Signed)
ANTICOAGULATION CONSULT NOTE - Follow Up  Pharmacy Consult for heparin Indication: chest pain/ACS  Allergies  Allergen Reactions   Sulfonamide Derivatives     REACTION: hives    Patient Measurements: Height: 5' 6"$  (167.6 cm) Weight: 89.8 kg (198 lb) (Pt weighed at home) IBW/kg (Calculated) : 63.8 Heparin Dosing Weight: 83kg  Vital Signs: Temp: 98 F (36.7 C) (02/20 2100) Temp Source: Oral (02/20 2100) BP: 143/90 (02/20 2100) Pulse Rate: 75 (02/20 2100)  Labs: Recent Labs    05/09/22 1802 05/10/22 0122 05/10/22 0350 05/10/22 0505 05/11/22 0231 05/11/22 1010 05/12/22 0333  HGB 16.2  --   --   --  15.8  --  15.6  HCT 45.4  --   --   --  44.4  --  44.9  PLT 259  --   --   --  233  --  252  APTT  --   --  29  --   --   --   --   LABPROT  --   --  13.5  --   --   --   --   INR  --   --  1.0  --   --   --   --   HEPARINUNFRC  --   --   --   --  0.23* 0.34 0.32  CREATININE 0.94  --   --   --  1.02  --   --   TROPONINIHS  --  17 19* 17  --   --   --      Estimated Creatinine Clearance: 83.9 mL/min (by C-G formula based on SCr of 1.02 mg/dL).   Medical History: Past Medical History:  Diagnosis Date   ADHD (attention deficit hyperactivity disorder)    Anxiety state, unspecified    Bipolar 1 disorder ([place])    CHEST PAIN 12/04/2008   Qualifier: Diagnosis of  By: Glori Bickers MD, Carmell Austria    Depressive disorder, not elsewhere classified    Dermatophytosis of nail    Esophageal reflux    HYPERGLYCEMIA 07/10/2007   Qualifier: Diagnosis of  By: Glori Bickers MD, Carmell Austria    Kidney stones 09/22/2010   Low back pain 09/18/2013   Other chronic nonalcoholic liver disease    Problems with hearing    Prostatitis, unspecified    Pure hypercholesterolemia    Screen for STD (sexually transmitted disease) 04/20/2011   Sprain of cruciate ligament of knee    Tear of medial cartilage or meniscus of knee, current    TMJ (temporomandibular joint disorder) 12/25/2010   Unspecified essential  hypertension    Unspecified gastritis and gastroduodenitis without mention of hemorrhage    Urethritis, unspecified     Medications:  Medications Prior to Admission  Medication Sig Dispense Refill Last Dose   ALPRAZolam (XANAX) 1 MG tablet Take 1 mg by mouth Once daily as needed for anxiety.   05/09/2022 at 2230   amLODipine (NORVASC) 10 MG tablet TAKE 2 TABLETS BY MOUTH DAILY AS DIRECTED (Patient taking differently: Take 10 mg by mouth daily.) 180 tablet 3 05/09/2022 at am   aspirin 81 MG chewable tablet Chew 1 tablet (81 mg total) by mouth daily. 30 tablet 0 05/09/2022 at am   fish oil-omega-3 fatty acids 1000 MG capsule Take 2 g by mouth daily.   05/09/2022 at am   isosorbide mononitrate (IMDUR) 30 MG 24 hr tablet Take 1 tablet (30 mg total) by mouth daily. 30 tablet 1 05/09/2022 at am  metoprolol succinate (TOPROL-XL) 25 MG 24 hr tablet Take 1 tablet (25 mg total) by mouth daily. 30 tablet 1 05/09/2022 at 0930   Multiple Vitamin (MULTIVITAMIN) capsule Take 1 capsule by mouth daily.   05/09/2022   nitroGLYCERIN (NITROSTAT) 0.4 MG SL tablet Place 1 tablet (0.4 mg total) under the tongue every 5 (five) minutes as needed for chest pain. 30 tablet 3 05/09/2022   sertraline (ZOLOFT) 100 MG tablet Take 200 mg by mouth daily.   05/09/2022 at am   simvastatin (ZOCOR) 20 MG tablet Take 20 mg by mouth at bedtime.   05/09/2022 at pm   tamsulosin (FLOMAX) 0.4 MG CAPS capsule Take 0.4 mg by mouth daily.   05/09/2022 at am   traZODone (DESYREL) 50 MG tablet Take 100 mg by mouth at bedtime as needed for sleep.   05/09/2022 at pm   Scheduled:   amLODipine  10 mg Oral Daily   aspirin  81 mg Oral Daily   atorvastatin  40 mg Oral Daily   chlorhexidine  60 mL Topical Once   chlorhexidine  15 mL Mouth/Throat Once   Chlorhexidine Gluconate Cloth  6 each Topical Daily   diazepam  5 mg Oral Once   epinephrine  0-10 mcg/min Intravenous To OR   heparin sodium (porcine) 2,500 Units, papaverine 30 mg in electrolyte-A  (PLASMALYTE-A PH 7.4) 500 mL irrigation   Irrigation To OR   insulin   Intravenous To OR   isosorbide mononitrate  30 mg Oral Daily   magnesium sulfate  40 mEq Other To OR   metoprolol succinate  25 mg Oral Daily   metoprolol tartrate  12.5 mg Oral Once   multivitamin with minerals  1 tablet Oral Daily   mupirocin ointment  1 Application Nasal BID   omega-3 acid ethyl esters  2 g Oral Daily   phenylephrine  30-200 mcg/min Intravenous To OR   potassium chloride  80 mEq Other To OR   sertraline  200 mg Oral Daily   sodium chloride flush  3 mL Intravenous Q12H   tamsulosin  0.4 mg Oral Daily   tranexamic acid  15 mg/kg Intravenous To OR   tranexamic acid  2 mg/kg Intracatheter To OR   Infusions:   sodium chloride      ceFAZolin (ANCEF) IV      ceFAZolin (ANCEF) IV     dexmedetomidine     heparin 30,000 units/NS 1000 mL solution for CELLSAVER     heparin Stopped (05/11/22 2020)   milrinone     nitroGLYCERIN     norepinephrine     tranexamic acid (CYKLOKAPRON) 2,500 mg in sodium chloride 0.9 % 250 mL (10 mg/mL) infusion     vancomycin      Assessment: Pt is s/p cath with multivessels dz. Plan for CABG consult. Heparin restarted 2 hr post radial band removal at ~1900.  Heparin level 0.32 at goal on heparin drip 1350 uts/hr. Hgb stable 15 pltc stable 200s . No signs/symptoms of bleeding or issues with infusion documented   Goal of Therapy:  Heparin level 0.3-0.7 units/ml Monitor platelets by anticoagulation protocol: Yes   Plan:  Continue heparin drip 1350 uts/hr  Follow up post op CABG 2/21  Sandford Craze, PharmD. [facility] Acute Care PGY-1  05/12/2022 4:59 AM

## 2022-05-12 NOTE — Discharge Instructions (Addendum)
Discharge Instructions:  1. You may shower, please wash incisions daily with soap and water and keep dry.  If you wish to cover wounds with dressing you may do so but please keep clean and change daily.  No tub baths or swimming until incisions have completely healed.  If your incisions become red or develop any drainage please call our office at [phone]  2. No Driving until cleared by Dr. Hendrickson's office and you are no longer using narcotic pain medications  3. Monitor your weight daily.. Please use the same scale and weigh at same time... If you gain 5-10 lbs in 48 hours with associated lower extremity swelling, please contact our office at [phone]  4. Fever of 101.5 for at least 24 hours with no source, please contact our office at [phone]  5. Activity- up as tolerated, please walk at least 3 times per day.  Avoid strenuous activity, no lifting, pushing, or pulling with your arms over 8-10 lbs for a minimum of 6 weeks  6. If any questions or concerns arise, please do not hesitate to contact our office at [phone]]   ===============================================================================================  Information on my medicine - ELIQUIS (apixaban)  This medication education was reviewed with me or my healthcare representative as part of my discharge preparation.   Why was Eliquis prescribed for you? Eliquis was prescribed for you to reduce the risk of a blood clot forming that can cause a stroke if you have a medical condition called atrial fibrillation (a type of irregular heartbeat).  What do You need to know about Eliquis ? Take your Eliquis TWICE DAILY - one tablet in the morning and one tablet in the evening with or without food. If you have difficulty swallowing the tablet whole please discuss with your pharmacist how to take the medication safely.  Take Eliquis exactly as prescribed by your doctor and DO NOT stop taking Eliquis without  talking to the doctor who prescribed the medication.  Stopping may increase your risk of developing a stroke.  Refill your prescription before you run out.  After discharge, you should have regular check-up appointments with your healthcare provider that is prescribing your Eliquis.  In the future your dose may need to be changed if your kidney function or weight changes by a significant amount or as you get older.  What do you do if you miss a dose? If you miss a dose, take it as soon as you remember on the same day and resume taking twice daily.  Do not take more than one dose of ELIQUIS at the same time to make up a missed dose.  Important Safety Information A possible side effect of Eliquis is bleeding. You should call your healthcare provider right away if you experience any of the following: Bleeding from an injury or your nose that does not stop. Unusual colored urine (red or dark brown) or unusual colored stools (red or black). Unusual bruising for unknown reasons. A serious fall or if you hit your head (even if there is no bleeding).  Some medicines may interact with Eliquis and might increase your risk of bleeding or clotting while on Eliquis. To help avoid this, consult your healthcare provider or pharmacist prior to using any new prescription or non-prescription medications, including herbals, vitamins, non-steroidal anti-inflammatory drugs (NSAIDs) and supplements.  This website has more information on Eliquis (apixaban): http://www.eliquis.com/eliquis/home   

## 2022-05-12 NOTE — Brief Op Note (Signed)
05/09/2022 - 05/12/2022  4:23 PM  PATIENT:  Glenn Stewart  57 y.o. male  PRE-OPERATIVE DIAGNOSIS:  CORONARY ARTERY DISEASE  POST-OPERATIVE DIAGNOSIS:  CORONARY ARTERY DISEASE  PROCEDURE:  Procedure(s):  CORONARY ARTERY BYPASS GRAFTING x 5 -LIMA to LAD -RADIAL ARTERY to OM1 -SVG to DIAG -SEQ SVG to AM and PL  TRANSESOPHAGEAL ECHOCARDIOGRAM (N/A)  OPEN LEFT RADIAL ARTERY HARVEST -32 min harvest/prep time  ENDOSCOPIC HARVEST GREATER SAPHENOUS VEIN Vein harvest time: 70 min Vein prep time:  10 min  SURGEON:  Surgeon(s) and Role:    [name], MD - Primary  PHYSICIAN ASSISTANT:   1. Wynelle Beckmann PA-C 2.Junie Panning Barrett PA-C  ASSISTANTS: Vernie Murders RNFA   ANESTHESIA:   general  EBL:  657 mL   BLOOD ADMINISTERED:   CC CELLSAVER  DRAINS:  Left Pleural Chest tube, mediastinal Chest drain    LOCAL MEDICATIONS USED:  NONE  SPECIMEN:  No Specimen  DISPOSITION OF SPECIMEN:  N/A  COUNTS:  YES  TOURNIQUET:  * No tourniquets in log *  DICTATION: -  PLAN OF CARE: Admit to inpatient   PATIENT DISPOSITION:  ICU - intubated and hemodynamically stable.   Delay start of Pharmacological VTE agent (>24hrs) due to surgical blood loss or risk of bleeding: yes

## 2022-05-12 NOTE — Interval H&P Note (Signed)
History and Physical Interval Note:  05/12/2022 7:28 AM  Glenn Stewart  has presented today for surgery, with the diagnosis of CAD.  The various methods of treatment have been discussed with the patient and family. After consideration of risks, benefits and other options for treatment, the patient has consented to  Procedure(s): CORONARY ARTERY BYPASS GRAFTING (CABG) (N/A) RADIAL ARTERY HARVEST (Left) TRANSESOPHAGEAL ECHOCARDIOGRAM (N/A) as a surgical intervention.  The patient's history has been reviewed, patient examined, no change in status, stable for surgery.  I have reviewed the patient's chart and labs.  Questions were answered to the patient's satisfaction.     [name]

## 2022-05-12 NOTE — Transfer of Care (Signed)
Immediate Anesthesia Transfer of Care Note  Patient: Glenn Stewart  Procedure(s) Performed: CORONARY ARTERY BYPASS GRAFTING (CABG) X FIVE BYPASSES USING OPEN LEFT INTERNAL MAMMARY ARTERY, OPEN LEFT RADIAL ARTERY, AND ENDOSCOPIC RIGHT GREATER SAPHENOUS VEIN HARVEST. (Chest) RADIAL ARTERY HARVEST (Left) TRANSESOPHAGEAL ECHOCARDIOGRAM  Patient Location: ICU  Anesthesia Type:General  Level of Consciousness: Patient remains intubated per anesthesia plan  Airway & Oxygen Therapy: Patient placed on Ventilator (see vital sign flow sheet for setting)  Post-op Assessment: Report given to RN and Post -op Vital signs reviewed and stable  Post vital signs: Reviewed and stable  Last Vitals:  Vitals Value Taken Time  BP 111/58 (aline)   Temp 35.5 C 05/12/22 1443  Pulse 80 05/12/22 1443  Resp 14 05/12/22 1443  SpO2 94 % 05/12/22 1443  Vitals shown include unvalidated device data.  Last Pain:  Vitals:   05/12/22 0506  TempSrc: Oral  PainSc: 0-No pain         Complications: No notable events documented.

## 2022-05-13 ENCOUNTER — Inpatient Hospital Stay (HOSPITAL_COMMUNITY): Payer: Medicaid Other

## 2022-05-13 ENCOUNTER — Encounter (HOSPITAL_COMMUNITY): Payer: Self-pay | Admitting: Thoracic Surgery (Cardiothoracic Vascular Surgery)

## 2022-05-13 LAB — BASIC METABOLIC PANEL
Anion gap: 7 (ref 5–15)
Anion gap: 8 (ref 5–15)
BUN: 10 mg/dL (ref 6–20)
BUN: 11 mg/dL (ref 6–20)
CO2: 23 mmol/L (ref 22–32)
CO2: 26 mmol/L (ref 22–32)
Calcium: 8.1 mg/dL — ABNORMAL LOW (ref 8.9–10.3)
Calcium: 8.5 mg/dL — ABNORMAL LOW (ref 8.9–10.3)
Chloride: 105 mmol/L (ref 98–111)
Chloride: 101 mmol/L (ref 98–111)
Creatinine, Ser: 0.78 mg/dL (ref 0.61–1.24)
Creatinine, Ser: 0.84 mg/dL (ref 0.61–1.24)
GFR, Estimated: >60 mL/min (ref >60)
GFR, Estimated: >60 mL/min (ref >60)
Glucose, Bld: 104 mg/dL — ABNORMAL HIGH (ref 70–99)
Glucose, Bld: 131 mg/dL — ABNORMAL HIGH (ref 70–99)
Potassium: 3.9 mmol/L (ref 3.5–5.1)
Potassium: 3.7 mmol/L (ref 3.5–5.1)
Sodium: 135 mmol/L (ref 135–145)
Sodium: 135 mmol/L (ref 135–145)

## 2022-05-13 LAB — GLUCOSE, CAPILLARY
Glucose-Capillary: 112 mg/dL — ABNORMAL HIGH (ref 70–99)
Glucose-Capillary: 114 mg/dL — ABNORMAL HIGH (ref 70–99)
Glucose-Capillary: 121 mg/dL — ABNORMAL HIGH (ref 70–99)
Glucose-Capillary: 121 mg/dL — ABNORMAL HIGH (ref 70–99)
Glucose-Capillary: 125 mg/dL — ABNORMAL HIGH (ref 70–99)
Glucose-Capillary: 130 mg/dL — ABNORMAL HIGH (ref 70–99)
Glucose-Capillary: 133 mg/dL — ABNORMAL HIGH (ref 70–99)
Glucose-Capillary: 136 mg/dL — ABNORMAL HIGH (ref 70–99)
Glucose-Capillary: 137 mg/dL — ABNORMAL HIGH (ref 70–99)

## 2022-05-13 LAB — CBC
HCT: 36.3 % — ABNORMAL LOW (ref 39.0–52.0)
HCT: 35.9 % — ABNORMAL LOW (ref 39.0–52.0)
Hemoglobin: 12.5 g/dL — ABNORMAL LOW (ref 13.0–17.0)
Hemoglobin: 12.7 g/dL — ABNORMAL LOW (ref 13.0–17.0)
MCH: 28.4 pg (ref 26.0–34.0)
MCH: 29.3 pg (ref 26.0–34.0)
MCHC: 34.4 g/dL (ref 30.0–36.0)
MCHC: 35.4 g/dL (ref 30.0–36.0)
MCV: 82.5 fL (ref 80.0–100.0)
MCV: 82.9 fL (ref 80.0–100.0)
Platelets: 182 10*3/uL (ref 150–400)
Platelets: 177 10*3/uL (ref 150–400)
RBC: 4.4 MIL/uL (ref 4.22–5.81)
RBC: 4.33 MIL/uL (ref 4.22–5.81)
RDW: 12.9 % (ref 11.5–15.5)
RDW: 12.9 % (ref 11.5–15.5)
WBC: 8.4 10*3/uL (ref 4.0–10.5)
WBC: 10.3 10*3/uL (ref 4.0–10.5)
nRBC: 0 % (ref 0.0–0.2)
nRBC: 0 % (ref 0.0–0.2)

## 2022-05-13 LAB — MAGNESIUM
Magnesium: 2.5 mg/dL — ABNORMAL HIGH (ref 1.7–2.4)
Magnesium: 2.4 mg/dL (ref 1.7–2.4)

## 2022-05-13 MED ORDER — FUROSEMIDE 10 MG/ML IJ SOLN
20.0000 mg | Freq: Two times a day (BID) | INTRAMUSCULAR | Status: DC
  Administered 2022-05-13 (×2): 20 mg via INTRAVENOUS
  Filled 2022-05-13 (×2): qty 2

## 2022-05-13 MED ORDER — ISOSORBIDE MONONITRATE ER 30 MG PO TB24
30.0000 mg | ORAL_TABLET | Freq: Every day | ORAL | Status: DC
  Administered 2022-05-13 – 2022-05-19 (×7): 30 mg via ORAL
  Filled 2022-05-13 (×7): qty 1

## 2022-05-13 MED ORDER — ENOXAPARIN SODIUM 40 MG/0.4ML IJ SOSY
40.0000 mg | PREFILLED_SYRINGE | Freq: Every day | INTRAMUSCULAR | Status: DC
  Administered 2022-05-13 – 2022-05-17 (×5): 40 mg via SUBCUTANEOUS
  Filled 2022-05-13 (×5): qty 0.4

## 2022-05-13 MED ORDER — INSULIN DETEMIR 100 UNIT/ML ~~LOC~~ SOLN
12.0000 [IU] | Freq: Two times a day (BID) | SUBCUTANEOUS | Status: DC
  Administered 2022-05-13 (×2): 12 [IU] via SUBCUTANEOUS
  Filled 2022-05-13 (×4): qty 0.12

## 2022-05-13 MED ORDER — INSULIN ASPART 100 UNIT/ML IJ SOLN
0.0000 [IU] | INTRAMUSCULAR | Status: DC
  Administered 2022-05-13 – 2022-05-14 (×4): 2 [IU] via SUBCUTANEOUS

## 2022-05-13 MED ORDER — POTASSIUM CHLORIDE 10 MEQ/50ML IV SOLN
10.0000 meq | INTRAVENOUS | Status: AC
  Administered 2022-05-13 (×3): 10 meq via INTRAVENOUS
  Filled 2022-05-13 (×3): qty 50

## 2022-05-13 MED FILL — Heparin Sodium (Porcine) Inj 1000 Unit/ML: Qty: 1000 | Status: AC

## 2022-05-13 MED FILL — Magnesium Sulfate Inj 50%: INTRAMUSCULAR | Qty: 10 | Status: AC

## 2022-05-13 MED FILL — Potassium Chloride Inj 2 mEq/ML: INTRAVENOUS | Qty: 40 | Status: AC

## 2022-05-13 NOTE — Progress Notes (Signed)
Patient ID: Glenn Stewart, male   DOB: April 17, 1965, 56 y.o.   MRN: [id number]  TCTS Evening Rounds  Hemodynamically stable  Diuresing well.  BMET    Component Value Date/Time   NA 135 05/13/2022 1704   K 3.7 05/13/2022 1704   CL 101 05/13/2022 1704   CO2 26 05/13/2022 1704   GLUCOSE 131 (H) 05/13/2022 1704   BUN 11 05/13/2022 1704   CREATININE 0.84 05/13/2022 1704   CALCIUM 8.5 (L) 05/13/2022 1704   GFRNONAA >60 05/13/2022 1704   CBC    Component Value Date/Time   WBC 10.3 05/13/2022 1704   RBC 4.33 05/13/2022 1704   HGB 12.7 (L) 05/13/2022 1704   HCT 35.9 (L) 05/13/2022 1704   PLT 177 05/13/2022 1704   MCV 82.9 05/13/2022 1704   MCH 29.3 05/13/2022 1704   MCHC 35.4 05/13/2022 1704   RDW 12.9 05/13/2022 1704   LYMPHSABS 2.3 09/29/2020 1948   MONOABS 0.9 09/29/2020 1948   EOSABS 0.1 09/29/2020 1948   BASOSABS 0.0 09/29/2020 1948   Continue current care plan.

## 2022-05-13 NOTE — Progress Notes (Signed)
1 Day Post-Op Procedure(s) (LRB): CORONARY ARTERY BYPASS GRAFTING (CABG) X FIVE BYPASSES USING OPEN LEFT INTERNAL MAMMARY ARTERY, OPEN LEFT RADIAL ARTERY, AND ENDOSCOPIC RIGHT GREATER SAPHENOUS VEIN HARVEST. (N/A) RADIAL ARTERY HARVEST (Left) TRANSESOPHAGEAL ECHOCARDIOGRAM (N/A) Subjective: Some incisional pain  Objective: Vital signs in last 24 hours: Temp:  [95.9 F (35.5 C)-99.1 F (37.3 C)] 99 F (37.2 C) (02/22 0730) Pulse Rate:  [58-89] 77 (02/22 0730) Cardiac Rhythm: Atrial paced (02/21 2000) Resp:  [11-33] 19 (02/22 0730) BP: (97-144)/(69-97) 133/86 (02/22 0715) SpO2:  [73 %-99 %] 97 % (02/22 0730) Arterial Line BP: (90-159)/(50-107) 151/71 (02/22 0730) FiO2 (%):  [40 %-50 %] 40 % (02/21 1841) Weight:  [95.7 kg] 95.7 kg (02/22 0500)  Hemodynamic parameters for last 24 hours: PAP: (17-46)/(8-30) 29/13 CO:  [4 L/min-6.3 L/min] 6.3 L/min CI:  [2 L/min/m2-3.2 L/min/m2] 3.2 L/min/m2  Intake/Output from previous day: 02/21 0701 - 02/22 0700 In: 5407.8 [P.O.:370; I.V.:2723; Blood:460; IV Piggyback:1854.8] Out: [id number] [Urine:2720; Blood:657; Chest Tube:411] Intake/Output this shift: No intake/output data recorded.  General appearance: alert, cooperative, and no distress Neurologic: intact Heart: regular rate and rhythm Lungs: diminished breath sounds bibasilar Abdomen: normal findings: soft, non-tender  Lab Results: Recent Labs    05/12/22 2054 05/12/22 2120 05/13/22 0422  WBC 9.6  --  8.4  HGB 12.6* 12.2* 12.5*  HCT 37.3* 36.0* 36.3*  PLT 188  --  182   BMET:  Recent Labs    05/12/22 2054 05/12/22 2120 05/13/22 0422  NA 139 138 135  K 4.3 4.1 3.9  CL 109  --  105  CO2 22  --  23  GLUCOSE 120*  --  104*  BUN 13  --  10  CREATININE 0.88  --  0.78  CALCIUM 8.0*  --  8.1*    PT/INR:  Recent Labs    05/12/22 1453  LABPROT 16.4*  INR 1.3*   ABG    Component Value Date/Time   PHART 7.336 (L) 05/12/2022 2120   HCO3 22.1 05/12/2022 2120   TCO2 23  05/12/2022 2120   ACIDBASEDEF 4.0 (H) 05/12/2022 2120   O2SAT 98 05/12/2022 2120   CBG (last 3)  Recent Labs    05/13/22 0221 05/13/22 0418 05/13/22 0634  GLUCAP 130* 112* 133*    Assessment/Plan: S/P Procedure(s) (LRB): CORONARY ARTERY BYPASS GRAFTING (CABG) X FIVE BYPASSES USING OPEN LEFT INTERNAL MAMMARY ARTERY, OPEN LEFT RADIAL ARTERY, AND ENDOSCOPIC RIGHT GREATER SAPHENOUS VEIN HARVEST. (N/A) RADIAL ARTERY HARVEST (Left) TRANSESOPHAGEAL ECHOCARDIOGRAM (N/A) POD # 1 looks great NEURO- intact CV- in SR with good hemodynamics  ASA, beta blocker, statin  Imdur for radial graft RESP- IS RENAL - creatinine and lytes OK  Fluid overload- diurese ENDO- CBG well controlled  Transition to levemir + SSI GI- advance diet as tolerated DVT prophylaxis- SCD + enoxaparin Anemia secondary to ABL- mild, follow Dc chest tubes Cardiac rehab   LOS: 3 days    [name] 05/13/2022

## 2022-05-14 ENCOUNTER — Inpatient Hospital Stay (HOSPITAL_COMMUNITY): Payer: Medicaid Other

## 2022-05-14 DIAGNOSIS — I209 Angina pectoris, unspecified: Secondary | ICD-10-CM

## 2022-05-14 DIAGNOSIS — Z951 Presence of aortocoronary bypass graft: Secondary | ICD-10-CM

## 2022-05-14 LAB — CBC
HCT: 36.8 % — ABNORMAL LOW (ref 39.0–52.0)
Hemoglobin: 12.8 g/dL — ABNORMAL LOW (ref 13.0–17.0)
MCH: 28.8 pg (ref 26.0–34.0)
MCHC: 34.8 g/dL (ref 30.0–36.0)
MCV: 82.9 fL (ref 80.0–100.0)
Platelets: 198 10*3/uL (ref 150–400)
RBC: 4.44 MIL/uL (ref 4.22–5.81)
RDW: 12.9 % (ref 11.5–15.5)
WBC: 10.8 10*3/uL — ABNORMAL HIGH (ref 4.0–10.5)
nRBC: 0 % (ref 0.0–0.2)

## 2022-05-14 LAB — ECHO INTRAOPERATIVE TEE
AR max vel: 3 cm2
AV Area VTI: 2.77 cm2
AV Area mean vel: 2.61 cm2
AV Mean grad: 2 mmHg
AV Peak grad: 4.2 mmHg
Ao pk vel: 1.02 m/s
Height: 66 in
Single Plane A2C EF: 53.4 %
Weight: 3168 oz

## 2022-05-14 LAB — BASIC METABOLIC PANEL
Anion gap: 10 (ref 5–15)
BUN: 9 mg/dL (ref 6–20)
CO2: 27 mmol/L (ref 22–32)
Calcium: 8.6 mg/dL — ABNORMAL LOW (ref 8.9–10.3)
Chloride: 98 mmol/L (ref 98–111)
Creatinine, Ser: 0.5 mg/dL — ABNORMAL LOW (ref 0.61–1.24)
GFR, Estimated: >60 mL/min (ref >60)
Glucose, Bld: 118 mg/dL — ABNORMAL HIGH (ref 70–99)
Potassium: 3.5 mmol/L (ref 3.5–5.1)
Sodium: 135 mmol/L (ref 135–145)

## 2022-05-14 LAB — GLUCOSE, CAPILLARY
Glucose-Capillary: 113 mg/dL — ABNORMAL HIGH (ref 70–99)
Glucose-Capillary: 117 mg/dL — ABNORMAL HIGH (ref 70–99)
Glucose-Capillary: 121 mg/dL — ABNORMAL HIGH (ref 70–99)
Glucose-Capillary: 127 mg/dL — ABNORMAL HIGH (ref 70–99)
Glucose-Capillary: 134 mg/dL — ABNORMAL HIGH (ref 70–99)
Glucose-Capillary: 144 mg/dL — ABNORMAL HIGH (ref 70–99)

## 2022-05-14 MED ORDER — METOPROLOL TARTRATE 25 MG PO TABS
25.0000 mg | ORAL_TABLET | Freq: Two times a day (BID) | ORAL | Status: DC
  Administered 2022-05-14 (×2): 25 mg via ORAL
  Filled 2022-05-14 (×2): qty 1

## 2022-05-14 MED ORDER — INSULIN ASPART 100 UNIT/ML IJ SOLN
0.0000 [IU] | Freq: Three times a day (TID) | INTRAMUSCULAR | Status: DC
  Administered 2022-05-14 – 2022-05-15 (×3): 2 [IU] via SUBCUTANEOUS
  Administered 2022-05-15: 3 [IU] via SUBCUTANEOUS

## 2022-05-14 MED ORDER — AMLODIPINE BESYLATE 10 MG PO TABS
10.0000 mg | ORAL_TABLET | Freq: Every day | ORAL | Status: DC
  Administered 2022-05-14 – 2022-05-17 (×4): 10 mg via ORAL
  Filled 2022-05-14 (×4): qty 1

## 2022-05-14 MED ORDER — MAGNESIUM HYDROXIDE 400 MG/5ML PO SUSP: 30.0000 mL | Freq: Every day | ORAL | Status: DC | PRN

## 2022-05-14 MED ORDER — SODIUM CHLORIDE 0.9% FLUSH
3.0000 mL | INTRAVENOUS | Status: DC | PRN
  Administered 2022-05-16: 3 mL via INTRAVENOUS

## 2022-05-14 MED ORDER — POTASSIUM CHLORIDE ER 10 MEQ PO TBCR
40.0000 meq | EXTENDED_RELEASE_TABLET | Freq: Two times a day (BID) | ORAL | Status: AC
  Administered 2022-05-14 (×2): 40 meq via ORAL
  Filled 2022-05-14 (×4): qty 4

## 2022-05-14 MED ORDER — ~~LOC~~ CARDIAC SURGERY, PATIENT & FAMILY EDUCATION: Freq: Once | Status: AC

## 2022-05-14 MED ORDER — INSULIN ASPART 100 UNIT/ML IJ SOLN: 0.0000 [IU] | Freq: Every day | INTRAMUSCULAR | Status: DC

## 2022-05-14 MED ORDER — SODIUM CHLORIDE 0.9% FLUSH
3.0000 mL | Freq: Two times a day (BID) | INTRAVENOUS | Status: DC
  Administered 2022-05-14 – 2022-05-19 (×10): 3 mL via INTRAVENOUS

## 2022-05-14 MED ORDER — SODIUM CHLORIDE 0.9 % IV SOLN: 250.0000 mL | INTRAVENOUS | Status: DC | PRN

## 2022-05-14 MED ORDER — ALUM & MAG HYDROXIDE-SIMETH 200-200-20 MG/5ML PO SUSP: 15.0000 mL | Freq: Four times a day (QID) | ORAL | Status: DC | PRN

## 2022-05-14 MED ORDER — ALPRAZOLAM 0.5 MG PO TABS
1.0000 mg | ORAL_TABLET | Freq: Every day | ORAL | Status: DC | PRN
  Administered 2022-05-14 – 2022-05-15 (×2): 1 mg via ORAL
  Filled 2022-05-14 (×3): qty 2

## 2022-05-14 MED ORDER — KETOROLAC TROMETHAMINE 30 MG/ML IJ SOLN
30.0000 mg | Freq: Four times a day (QID) | INTRAMUSCULAR | Status: AC
  Administered 2022-05-14 – 2022-05-15 (×3): 30 mg via INTRAVENOUS
  Filled 2022-05-14 (×3): qty 1

## 2022-05-14 NOTE — Anesthesia Postprocedure Evaluation (Signed)
Anesthesia Post Note  Patient: Glenn Stewart  Procedure(Stewart) Performed: CORONARY ARTERY BYPASS GRAFTING (CABG) X FIVE BYPASSES USING OPEN LEFT INTERNAL MAMMARY ARTERY, OPEN LEFT RADIAL ARTERY, AND ENDOSCOPIC RIGHT GREATER SAPHENOUS VEIN HARVEST. (Chest) RADIAL ARTERY HARVEST (Left) TRANSESOPHAGEAL ECHOCARDIOGRAM     Patient location during evaluation: SICU Anesthesia Type: General Level of consciousness: sedated Pain management: pain level controlled Vital Signs Assessment: post-procedure vital signs reviewed and stable Respiratory status: patient remains intubated per anesthesia plan Cardiovascular status: stable Postop Assessment: no apparent nausea or vomiting Anesthetic complications: no  No notable events documented.  Last Vitals:  Vitals:   05/14/22 1300 05/14/22 1400  BP: 117/87 111/80  Pulse: 83 84  Resp: 17 18  Temp:    SpO2: 99% 95%    Last Pain:  Vitals:   05/14/22 1203  TempSrc: Oral  PainSc:                  Glenn Stewart

## 2022-05-14 NOTE — Progress Notes (Signed)
EVENING ROUNDS NOTE :     [address]       [place] [address]             [phone]                 2 Days Post-Op Procedure(s) (LRB): CORONARY ARTERY BYPASS GRAFTING (CABG) X FIVE BYPASSES USING OPEN LEFT INTERNAL MAMMARY ARTERY, OPEN LEFT RADIAL ARTERY, AND ENDOSCOPIC RIGHT GREATER SAPHENOUS VEIN HARVEST. (N/A) RADIAL ARTERY HARVEST (Left) TRANSESOPHAGEAL ECHOCARDIOGRAM (N/A)   Total Length of Stay:  LOS: 4 days  Events:   No events Awaiting floor bed    BP 122/85   Pulse 94   Temp 98.2 F (36.8 C) (Oral)   Resp 17   Ht '5\' 6"'$  (1.676 m)   Wt 94.1 kg   SpO2 92%   BMI 33.48 kg/m          sodium chloride      I/O last 3 completed shifts: In: 2052.4 [P.O.:370; I.V.:526.4; IV Piggyback:1156] Out: [id number] [Urine:9250; Chest Tube:325]      Latest Ref Rng & Units 05/14/2022    3:05 AM 05/13/2022    5:04 PM 05/13/2022    4:22 AM  CBC  WBC 4.0 - 10.5 K/uL 10.8  10.3  8.4   Hemoglobin 13.0 - 17.0 g/dL 12.8  12.7  12.5   Hematocrit 39.0 - 52.0 % 36.8  35.9  36.3   Platelets 150 - 400 K/uL 198  177  182        Latest Ref Rng & Units 05/14/2022    3:05 AM 05/13/2022    5:04 PM 05/13/2022    4:22 AM  BMP  Glucose 70 - 99 mg/dL 118  131  104   BUN 6 - 20 mg/dL '9  11  10   '$ Creatinine 0.61 - 1.24 mg/dL 0.50  0.84  0.78   Sodium 135 - 145 mmol/L 135  135  135   Potassium 3.5 - 5.1 mmol/L 3.5  3.7  3.9   Chloride 98 - 111 mmol/L 98  101  105   CO2 22 - 32 mmol/L '27  26  23   '$ Calcium 8.9 - 10.3 mg/dL 8.6  8.5  8.1     ABG    Component Value Date/Time   PHART 7.336 (L) 05/12/2022 2120   PCO2ART 41.4 05/12/2022 2120   PO2ART 105 05/12/2022 2120   HCO3 22.1 05/12/2022 2120   TCO2 23 05/12/2022 2120   ACIDBASEDEF 4.0 (H) 05/12/2022 2120   O2SAT 98 05/12/2022 2120       Melodie Bouillon, MD 05/14/2022 5:29 PM

## 2022-05-14 NOTE — TOC Initial Note (Addendum)
Transition of Care [facility]) - Initial/Assessment Note    Patient Details  Name: Glenn Stewart MRN: [id number] Date of Birth: Sep 19, 1965  Transition [facility]) CM/SW Contact:    Erenest Rasher, RN Phone Number: [phone] 05/14/2022, 4:31 PM  Clinical Narrative:                 Spoke to pt. Sent for Medicaid/CAFA and disability screening. Will stay with sister post dc. He lives alone. Pt will need assistance with Medications using MATCH. Will arrange PCP appt for follow post dc.   Expected Discharge Plan: Home/Self Care Barriers to Discharge: Continued Medical Work up   Patient Goals and CMS Choice Patient states their goals for this hospitalization and ongoing recovery are:: plan to return to his sisters home in [place] [facility].   Choice offered to / list presented to : NA      Expected Discharge Plan and Services   Discharge Planning Services: CM Consult Post Acute Care Choice: NA Living arrangements for the past 2 months: Single Family Home                   DME Agency: NA                  Prior Living Arrangements/Services Living arrangements for the past 2 months: Single Family Home Lives with:: Self Patient language and need for interpreter reviewed:: Yes Do you feel safe going back to the place where you live?: Yes      Need for Family Participation in Patient Care: Yes (Comment) Care giver support system in place?: Yes (comment)   Criminal Activity/Legal Involvement Pertinent to Current Situation/Hospitalization: No - Comment as needed  Activities of Daily Living Home Assistive Devices/Equipment: None ADL Screening (condition at time of admission) Patient's cognitive ability adequate to safely complete daily activities?: Yes Is the patient deaf or have difficulty hearing?: No Does the patient have difficulty seeing, even when wearing glasses/contacts?: No Does the patient have difficulty concentrating, remembering, or making decisions?:  No Patient able to express need for assistance with ADLs?: No Does the patient have difficulty dressing or bathing?: No Independently performs ADLs?: Yes (appropriate for developmental age) Does the patient have difficulty walking or climbing stairs?: No Weakness of Legs: None Weakness of Arms/Hands: None  Permission Sought/Granted Permission sought to share information with : Case Manager, Family Supports, PCP Permission granted to share information with : Yes, Verbal Permission Granted  Share Information with NAME: Chanda Busing     Permission granted to share info w Relationship: sister  Permission granted to share info w Contact Information: [phone]  Emotional Assessment Appearance:: Appears stated age Attitude/Demeanor/Rapport: Engaged Affect (typically observed): Appropriate Orientation: : Oriented to Situation, Oriented to  Time, Oriented to Place, Oriented to Self Alcohol / Substance Use: Not Applicable Psych Involvement: No (comment)  Admission diagnosis:  TIA (transient ischemic attack) [G45.9] Chest pain, rule out acute myocardial infarction [R07.9] Chest pain due to myocardial ischemia, unspecified ischemic chest pain type [I25.9] Unstable angina (HCC) [I20.0] Patient Active Problem List   Diagnosis Date Noted   S/P CABG x 5 05/12/2022   Chest pain, rule out acute myocardial infarction 05/10/2022   Facial droop 05/10/2022   Unstable angina ([place]) 05/10/2022   Hand paresthesia 04/03/2020   Frequent urination 04/03/2020   Prostate cancer screening 04/03/2020   CAD (coronary artery disease) 04/01/2020   Low back pain 09/18/2013   Screen for STD (sexually transmitted disease) 04/20/2011  TMJ (temporomandibular joint disorder) 12/25/2010   Kidney stones 09/22/2010   Hematuria 09/22/2010   Routine general medical examination at a health care facility 07/22/2010   TINEA VERSICOLOR 10/29/2009   MEDIAL MENISCUS TEAR, LEFT 05/31/2008   ACL TEAR, LEFT KNEE 05/31/2008    Pain in joint, forearm 05/28/2008   PROBLEMS WITH HEARING 03/18/2008   Prediabetes 07/10/2007   DERMATOPHYTOSIS OF NAIL 04/10/2007   Dyslipidemia, goal LDL below 70 09/06/2006   ANXIETY 09/06/2006   DEPRESSION 09/06/2006   Essential hypertension 09/06/2006   GERD 09/06/2006   FATTY LIVER DISEASE 09/06/2006   PCP:  Abner Greenspan, MD Pharmacy:   [facility] # [address] [place] [address] [place] [address] Phone: [phone] Fax: [phone]     Social Determinants of Health (SDOH) Social History: SDOH Screenings   Food Insecurity: No Food Insecurity (05/10/2022)  Housing: Low Risk  (05/10/2022)  Transportation Needs: No Transportation Needs (05/10/2022)  Utilities: Not At Risk (05/10/2022)  Tobacco Use: Low Risk  (05/13/2022)   SDOH Interventions:     Readmission Risk Interventions     No data to display

## 2022-05-14 NOTE — Progress Notes (Signed)
Cardiologist:  Stanford Breed  Subjective:   Some stomach upset Ambulated Foley irritating him   Objective:  Vitals:   05/14/22 0405 05/14/22 0500 05/14/22 0600 05/14/22 0700  BP:  (!) 148/80 133/77 (!) 145/89  Pulse: 97 95 83 75  Resp: 20 (!) 25 20 (!) 21  Temp:      TempSrc:      SpO2: 95% 93% 98% 96%  Weight: 94.1 kg     Height:        Intake/Output from previous day:  Intake/Output Summary (Last 24 hours) at 05/14/2022 0818 Last data filed at 05/14/2022 0700 Gross per 24 hour  Intake 757.14 ml  Output 8300 ml  Net -7542.86 ml    Physical Exam:  Anxious Right IJ triple lumen Lungs clear No rub  Abdomen benign Foley in place  Post left radial harvest  Lab Results: Basic Metabolic Panel: Recent Labs    05/13/22 0422 05/13/22 1704 05/14/22 0305  NA 135 135 135  K 3.9 3.7 3.5  CL 105 101 98  CO2 '23 26 27  '$ GLUCOSE 104* 131* 118*  BUN '10 11 9  '$ CREATININE 0.78 0.84 0.50*  CALCIUM 8.1* 8.5* 8.6*  MG 2.5* 2.4  --    Liver Function Tests: Recent Labs    05/11/22 1010  AST 23  ALT 26  ALKPHOS 51  BILITOT 1.3*  PROT 7.4  ALBUMIN 4.3   No results for input(s): "LIPASE", "AMYLASE" in the last 72 hours. CBC: Recent Labs    05/13/22 1704 05/14/22 0305  WBC 10.3 10.8*  HGB 12.7* 12.8*  HCT 35.9* 36.8*  MCV 82.9 82.9  PLT 177 198     Imaging: DG Chest Port 1 View  Result Date: 05/13/2022 CLINICAL DATA:  Status post CABG EXAM: PORTABLE CHEST 1 VIEW COMPARISON:  Chest radiograph 1 day prior FINDINGS: The endotracheal and enteric tubes have been removed. The right IJ Swan-Ganz catheter is stable terminating in the pulmonary outflow tract. Median sternotomy wires are unchanged. A left chest tube and presumed mediastinal drain are stable. The cardiomediastinal silhouette is stable. Lung volumes are low. Linear retrocardiac opacity likely reflects atelectasis. There is a possible trace left pleural effusion. There is no significant right effusion. There is  no pneumothorax. Overall, aeration is not significantly changed. The bones are stable. IMPRESSION: 1. Overall no significant interval change in lung aeration with left basilar atelectasis and possible trace left pleural effusion. 2. Interval extubation. Electronically Signed   By: Valetta Mole M.D.   On: 05/13/2022 08:28   DG Chest Port 1 View  Result Date: 05/12/2022 CLINICAL DATA:  [id number] S/P CABG (coronary artery bypass graft) [id number] EXAM: PORTABLE CHEST 1 VIEW COMPARISON:  05/11/2022 FINDINGS: Single frontal view of the chest demonstrates endotracheal tube overlying tracheal air column tip just below thoracic inlet. Enteric catheter passes below diaphragm, tip excluded by collimation but side port projecting over the gastric fundus. Right internal jugular flow directed central venous catheter tip overlies main pulmonary outflow tract. Epicardial pacing wires and mediastinal drain are noted. Chest tube overlies the left lung base. Cardiac silhouette is unremarkable. Lung volumes are diminished, with bilateral hypoventilatory changes left greater than right. No effusion or pneumothorax. No acute bony abnormalities. IMPRESSION: 1. Support devices as above. 2. Low lung volumes, with left greater than right basilar atelectasis. Electronically Signed   By: Randa Ngo M.D.   On: 05/12/2022 14:57   EP STUDY  Result Date: 05/12/2022 See surgical note for result.   Cardiac  Studies:  ECG: NSR ? Old IMI    Telemetry:  NSR   Echo: EF 50-55%   Medications:    acetaminophen  1,000 mg Oral Q6H   Or   acetaminophen (TYLENOL) oral liquid 160 mg/5 mL  1,000 mg Per Tube Q6H   amLODipine  10 mg Oral Daily   aspirin EC  325 mg Oral Daily   Or   aspirin  324 mg Per Tube Daily   atorvastatin  40 mg Oral Daily   bisacodyl  10 mg Oral Daily   Or   bisacodyl  10 mg Rectal Daily   Chlorhexidine Gluconate Cloth  6 each Topical Daily   docusate sodium  200 mg Oral Daily   enoxaparin (LOVENOX) injection  40  mg Subcutaneous QHS   insulin aspart  0-15 Units Subcutaneous TID WC   insulin aspart  0-5 Units Subcutaneous QHS   isosorbide mononitrate  30 mg Oral Daily   ketorolac  30 mg Intravenous Q6H   metoprolol tartrate  25 mg Oral BID   mupirocin ointment  1 Application Nasal BID   pantoprazole  40 mg Oral Daily   potassium chloride  40 mEq Oral BID   sertraline  200 mg Oral Daily   sodium chloride flush  3 mL Intravenous Q12H   tamsulosin  0.4 mg Oral Daily      sodium chloride     sodium chloride     sodium chloride     lactated ringers     lactated ringers Stopped (05/13/22 0732)   lactated ringers 10 mL/hr at 05/14/22 0700    Assessment/Plan:   CAD:  cath with severe 3 vessel CAD Essentially CTO of LAD/RCA and high grade LCX dx s. Carotids with plaque no stenosis TIA ? Symptoms on admission negative MRI.  Post CABG 2 days post CABG with LIMA to LAD, radial to OM, SVG D1 and sequential graft to AM/PDA Supplement K Hct fine d/c foley rhythm stable sinus On nitrates for radial graft Resume xanax and continue sertraline for anxiety Cardiac rehab and transfer to floor   Jenkins Rouge 05/14/2022, 8:18 AM

## 2022-05-14 NOTE — Progress Notes (Signed)
2 Days Post-Op Procedure(s) (LRB): CORONARY ARTERY BYPASS GRAFTING (CABG) X FIVE BYPASSES USING OPEN LEFT INTERNAL MAMMARY ARTERY, OPEN LEFT RADIAL ARTERY, AND ENDOSCOPIC RIGHT GREATER SAPHENOUS VEIN HARVEST. (N/A) RADIAL ARTERY HARVEST (Left) TRANSESOPHAGEAL ECHOCARDIOGRAM (N/A) Subjective: "I don't feel well". Chest hurts, didn't sleep (refused Trazadone) Ambulated 13 laps! This Am Objective: Vital signs in last 24 hours: Temp:  [98.4 F (36.9 C)-99 F (37.2 C)] 98.4 F (36.9 C) (02/23 0400) Pulse Rate:  [70-97] 75 (02/23 0700) Cardiac Rhythm: Normal sinus rhythm (02/23 0000) Resp:  [14-26] 21 (02/23 0700) BP: (102-152)/(72-96) 145/89 (02/23 0700) SpO2:  [90 %-100 %] 96 % (02/23 0700) Arterial Line BP: (94-159)/(45-81) 119/53 (02/22 1200) Weight:  [94.1 kg] 94.1 kg (02/23 0405)  Hemodynamic parameters for last 24 hours: PAP: (29-31)/(11-17) 30/16  Intake/Output from previous day: 02/22 0701 - 02/23 0700 In: 774.5 [I.V.:176.1; IV Piggyback:598.4] Out: 8415 [Urine:8355; Chest Tube:60] Intake/Output this shift: No intake/output data recorded.  General appearance: alert, cooperative, no distress, and anxious Neurologic: intact Heart: regular rate and rhythm Lungs: diminished breath sounds bibasilar Abdomen: normal findings: soft, non-tender  Lab Results: Recent Labs    05/13/22 1704 05/14/22 0305  WBC 10.3 10.8*  HGB 12.7* 12.8*  HCT 35.9* 36.8*  PLT 177 198   BMET:  Recent Labs    05/13/22 1704 05/14/22 0305  NA 135 135  K 3.7 3.5  CL 101 98  CO2 26 27  GLUCOSE 131* 118*  BUN 11 9  CREATININE 0.84 0.50*  CALCIUM 8.5* 8.6*    PT/INR:  Recent Labs    05/12/22 1453  LABPROT 16.4*  INR 1.3*   ABG    Component Value Date/Time   PHART 7.336 (L) 05/12/2022 2120   HCO3 22.1 05/12/2022 2120   TCO2 23 05/12/2022 2120   ACIDBASEDEF 4.0 (H) 05/12/2022 2120   O2SAT 98 05/12/2022 2120   CBG (last 3)  Recent Labs    05/13/22 2009 05/13/22 2358  05/14/22 0348  GLUCAP 136* 117* 134*    Assessment/Plan: S/P Procedure(s) (LRB): CORONARY ARTERY BYPASS GRAFTING (CABG) X FIVE BYPASSES USING OPEN LEFT INTERNAL MAMMARY ARTERY, OPEN LEFT RADIAL ARTERY, AND ENDOSCOPIC RIGHT GREATER SAPHENOUS VEIN HARVEST. (N/A) RADIAL ARTERY HARVEST (Left) TRANSESOPHAGEAL ECHOCARDIOGRAM (N/A) POD # 2 NEURO/ PSYCH- very anxious, will resume PRN Xanax  Encourage to take Zoloft, Trazadone CV- in SR, Bp a little high- resume Norvasc  Imdur for radial graft  ASA, metoprolol, statin  Dc pacing wires RESP- IS RENAL- creatinine normal , large volume diuresis yesterday  Will stop Lasix, supplement K ENDO- CBG well controlled  Change to AC/HS SSi, stop Levemir GI- tolerating diet SCD + enoxaparin + ambulation for DVT prophylaxis Pain control- continue Toradol another 24 hours Transfer to 4E   LOS: 4 days    [name] 05/14/2022

## 2022-05-15 ENCOUNTER — Inpatient Hospital Stay (HOSPITAL_COMMUNITY): Payer: Medicaid Other

## 2022-05-15 DIAGNOSIS — R079 Chest pain, unspecified: Secondary | ICD-10-CM

## 2022-05-15 LAB — GLUCOSE, CAPILLARY
Glucose-Capillary: 108 mg/dL — ABNORMAL HIGH (ref 70–99)
Glucose-Capillary: 110 mg/dL — ABNORMAL HIGH (ref 70–99)
Glucose-Capillary: 111 mg/dL — ABNORMAL HIGH (ref 70–99)
Glucose-Capillary: 130 mg/dL — ABNORMAL HIGH (ref 70–99)
Glucose-Capillary: 165 mg/dL — ABNORMAL HIGH (ref 70–99)

## 2022-05-15 LAB — BASIC METABOLIC PANEL
Anion gap: 8 (ref 5–15)
BUN: 14 mg/dL (ref 6–20)
CO2: 27 mmol/L (ref 22–32)
Calcium: 8.7 mg/dL — ABNORMAL LOW (ref 8.9–10.3)
Chloride: 102 mmol/L (ref 98–111)
Creatinine, Ser: 0.83 mg/dL (ref 0.61–1.24)
GFR, Estimated: >60 mL/min (ref >60)
Glucose, Bld: 102 mg/dL — ABNORMAL HIGH (ref 70–99)
Potassium: 4.4 mmol/L (ref 3.5–5.1)
Sodium: 137 mmol/L (ref 135–145)

## 2022-05-15 LAB — CBC
HCT: 37.5 % — ABNORMAL LOW (ref 39.0–52.0)
Hemoglobin: 12.3 g/dL — ABNORMAL LOW (ref 13.0–17.0)
MCH: 27.8 pg (ref 26.0–34.0)
MCHC: 32.8 g/dL (ref 30.0–36.0)
MCV: 84.8 fL (ref 80.0–100.0)
Platelets: 198 10*3/uL (ref 150–400)
RBC: 4.42 MIL/uL (ref 4.22–5.81)
RDW: 13.1 % (ref 11.5–15.5)
WBC: 9.6 10*3/uL (ref 4.0–10.5)
nRBC: 0 % (ref 0.0–0.2)

## 2022-05-15 MED ORDER — POTASSIUM CHLORIDE CRYS ER 20 MEQ PO TBCR
40.0000 meq | EXTENDED_RELEASE_TABLET | Freq: Every day | ORAL | Status: DC
  Administered 2022-05-15 – 2022-05-19 (×5): 40 meq via ORAL
  Filled 2022-05-15 (×5): qty 2

## 2022-05-15 MED ORDER — ATORVASTATIN CALCIUM 80 MG PO TABS
80.0000 mg | ORAL_TABLET | Freq: Every day | ORAL | Status: DC
  Administered 2022-05-16 – 2022-05-19 (×4): 80 mg via ORAL
  Filled 2022-05-15 (×4): qty 1

## 2022-05-15 MED ORDER — FUROSEMIDE 40 MG PO TABS
40.0000 mg | ORAL_TABLET | Freq: Every day | ORAL | Status: DC
  Administered 2022-05-15 – 2022-05-19 (×5): 40 mg via ORAL
  Filled 2022-05-15 (×5): qty 1

## 2022-05-15 MED ORDER — METOPROLOL TARTRATE 25 MG PO TABS
37.5000 mg | ORAL_TABLET | Freq: Two times a day (BID) | ORAL | Status: DC
  Administered 2022-05-15 – 2022-05-17 (×6): 37.5 mg via ORAL
  Filled 2022-05-15 (×6): qty 1

## 2022-05-15 NOTE — Progress Notes (Signed)
      [address]       [place] [address]             [phone]                 3 Days Post-Op Procedure(s) (LRB): CORONARY ARTERY BYPASS GRAFTING (CABG) X FIVE BYPASSES USING OPEN LEFT INTERNAL MAMMARY ARTERY, OPEN LEFT RADIAL ARTERY, AND ENDOSCOPIC RIGHT GREATER SAPHENOUS VEIN HARVEST. (N/A) RADIAL ARTERY HARVEST (Left) TRANSESOPHAGEAL ECHOCARDIOGRAM (N/A)   Events: No events _______________________________________________________________ Vitals: BP 117/72   Pulse 73   Temp 98.3 F (36.8 C) (Oral)   Resp 18   Ht 5' 6"$  (1.676 m)   Wt 93.7 kg   SpO2 94%   BMI 33.34 kg/m  Filed Weights   05/13/22 0500 05/14/22 0405 05/15/22 0210  Weight: 95.7 kg 94.1 kg 93.7 kg     - Neuro: alert NAD  - Cardiovascular: sinus tach  Drips: none.      - Pulm: EWOB    ABG    Component Value Date/Time   PHART 7.336 (L) 05/12/2022 2120   PCO2ART 41.4 05/12/2022 2120   PO2ART 105 05/12/2022 2120   HCO3 22.1 05/12/2022 2120   TCO2 23 05/12/2022 2120   ACIDBASEDEF 4.0 (H) 05/12/2022 2120   O2SAT 98 05/12/2022 2120    - Abd: ND - Extremity: warm  .Intake/Output      02/23 0701 02/24 0700 02/24 0701 02/25 0700   P.O. 120    I.V. (mL/kg) 50 (0.5)    IV Piggyback     Total Intake(mL/kg) 170 (1.8)    Urine (mL/kg/hr) 2350 (1)    Chest Tube     Total Output 2350    Net -2180         Urine Occurrence 1 x       _______________________________________________________________ Labs:    Latest Ref Rng & Units 05/15/2022    2:15 AM 05/14/2022    3:05 AM 05/13/2022    5:04 PM  CBC  WBC 4.0 - 10.5 K/uL 9.6  10.8  10.3   Hemoglobin 13.0 - 17.0 g/dL 12.3  12.8  12.7   Hematocrit 39.0 - 52.0 % 37.5  36.8  35.9   Platelets 150 - 400 K/uL 198  198  177       Latest Ref Rng & Units 05/15/2022    2:15 AM 05/14/2022    3:05 AM 05/13/2022    5:04 PM  CMP  Glucose 70 - 99 mg/dL 102  118  131   BUN 6 - 20 mg/dL 14  9  11   $ Creatinine 0.61 - 1.24 mg/dL 0.83   0.50  0.84   Sodium 135 - 145 mmol/L 137  135  135   Potassium 3.5 - 5.1 mmol/L 4.4  3.5  3.7   Chloride 98 - 111 mmol/L 102  98  101   CO2 22 - 32 mmol/L 27  27  26   $ Calcium 8.9 - 10.3 mg/dL 8.7  8.6  8.5     CXR: clear  _______________________________________________________________  Assessment and Plan: POD 3 s/p CABG.  Doing well  Neuro: pain controlled CV: on A/S/BB.  Titrating BB.  On amlodipine Pulm: IS, ambulation Renal: creat stable, diuresing GI: on diet Heme: stable ID: [email or web address] Endo: SSI Dispo: awaiting floor bed   Glenn Stewart O Glenn Stewart 05/15/2022 8:24 AM

## 2022-05-15 NOTE — Progress Notes (Signed)
CARDIAC REHAB PHASE I  Came to walk pt. Pt stated that he walked with nurse. Pt stated that he is doing good with walking. Ed given to pt. Discussed heart healthy diet, sternal precautions, IS use, wound care, and exercise guidelines. Will refer to [place]. Pt in recliner w/ call bell in reach. Discussed family care upon discharge w/ pt. Pt will stay w/ sisters.  [id number] Sheppard Plumber, MS, ACSM-CEP 05/15/2022 1:04 PM

## 2022-05-16 LAB — GLUCOSE, CAPILLARY
Glucose-Capillary: 103 mg/dL — ABNORMAL HIGH (ref 70–99)
Glucose-Capillary: 113 mg/dL — ABNORMAL HIGH (ref 70–99)
Glucose-Capillary: 107 mg/dL — ABNORMAL HIGH (ref 70–99)

## 2022-05-16 MED ORDER — LISINOPRIL 10 MG PO TABS
10.0000 mg | ORAL_TABLET | Freq: Every day | ORAL | Status: DC
  Administered 2022-05-16: 10 mg via ORAL
  Filled 2022-05-16: qty 1

## 2022-05-16 MED ORDER — ALPRAZOLAM 0.5 MG PO TABS
1.0000 mg | ORAL_TABLET | Freq: Two times a day (BID) | ORAL | Status: DC | PRN
  Administered 2022-05-16 – 2022-05-18 (×5): 1 mg via ORAL
  Filled 2022-05-16 (×5): qty 2

## 2022-05-16 NOTE — Progress Notes (Signed)
Patient was in bed anxious during bedside report. Worried about his HR episode from earlier. Resting HR was 100-112 while talking. Educated patient on post up Afib/Rvr and reassured not to worry about it too much .patient voiced understanding. Patient wanted to walk, HR while walking in hallway remained in 112. No SOB, HR got back down to 90's once came back in bed. Plan of care continues.

## 2022-05-16 NOTE — Progress Notes (Signed)
Pt went into about 30mn episode of AF-RVR, rates up to 180.  Converted on his own without medications.  Pt reported noticing the palpations upon lying back in bed after sitting on edge finishing lunch.  BP 99/77 taken after pt converted to NSR 80/90s.

## 2022-05-16 NOTE — Progress Notes (Addendum)
      [address]       [place] [address]             [phone]        4 Days Post-Op Procedure(s) (LRB): CORONARY ARTERY BYPASS GRAFTING (CABG) X FIVE BYPASSES USING OPEN LEFT INTERNAL MAMMARY ARTERY, OPEN LEFT RADIAL ARTERY, AND ENDOSCOPIC RIGHT GREATER SAPHENOUS VEIN HARVEST. (N/A) RADIAL ARTERY HARVEST (Left) TRANSESOPHAGEAL ECHOCARDIOGRAM (N/A)  Subjective: Patient felt a clicking when on his side yesterday during "cleaning up". He has had a bowel movement  Objective: Vital signs in last 24 hours: Temp:  [97.8 F (36.6 C)-98.9 F (37.2 C)] 97.8 F (36.6 C) (02/25 0804) Pulse Rate:  [77-103] 79 (02/25 0804) Cardiac Rhythm: Normal sinus rhythm (02/24 2018) Resp:  [15-23] 18 (02/25 0804) BP: (114-151)/(75-94) 151/94 (02/25 0804) SpO2:  [94 %-97 %] 94 % (02/25 0804) Weight:  [90.5 kg] 90.5 kg (02/25 0612)  Pre op weight 89.8 kg Current Weight  05/16/22 90.5 kg      Intake/Output from previous day: 02/24 0701 - 02/25 0700 In: 240 [P.O.:240] Out: 800 [Urine:800]   Physical Exam:  Cardiovascular: RRR Pulmonary: Slightly diminished bibasilar breath sounds Abdomen: Soft, non tender, bowel sounds present. Extremities: Trace bilateral lower extremity edema. Motor/senory LUE intact Wounds: Sternal, LUE, and RLE wounds are all clean and dry.  No erythema or signs of infection. There is no sternal instability (wires intact on CXR yesterday).  Lab Results: CBC: Recent Labs    05/14/22 0305 05/15/22 0215  WBC 10.8* 9.6  HGB 12.8* 12.3*  HCT 36.8* 37.5*  PLT 198 198   BMET:  Recent Labs    05/14/22 0305 05/15/22 0215  NA 135 137  K 3.5 4.4  CL 98 102  CO2 27 27  GLUCOSE 118* 102*  BUN 9 14  CREATININE 0.50* 0.83  CALCIUM 8.6* 8.7*    PT/INR:  Lab Results  Component Value Date   INR 1.3 (H) 05/12/2022   INR 1.0 05/10/2022   ABG:  INR: Will add last result for INR, ABG once components are confirmed Will add last 4 CBG results  once components are confirmed  Assessment/Plan: CV-SR, hypertensive. On Imdur 30 mg daily, Lopressor 37.5 mg bid, Amlodipine 10 mg daily. Will start low dose Lisinopril for better BP control. Pulmonary-on room air. Encourage incentive spirometer Volume overload-on Lasix 40 mg daily Expected post op blood loss anemia-H and H yesterday stable at 12.3 and 37.5 5. CBGs 165/110/103. Pre op HGA1C 5.4. Stop accu checks and SS PRN 6. On Lovenox for DVT prophylaxis 7. Will discuss disposition with Dr. Janne Lab M ZimmermanPA-C 8:40 AM  Agree with above Titrating BP meds [place]

## 2022-05-16 NOTE — Plan of Care (Signed)
Problem: Education: Goal: Understanding of cardiac disease, CV risk reduction, and recovery process will improve Outcome: Progressing Goal: Individualized Educational Video(s) Outcome: Progressing   Problem: Activity: Goal: Ability to tolerate increased activity will improve Outcome: Progressing   Problem: Cardiac: Goal: Ability to achieve and maintain adequate cardiovascular perfusion will improve Outcome: Progressing   Problem: Health Behavior/Discharge Planning: Goal: Ability to safely manage health-related needs after discharge will improve Outcome: Progressing   Problem: Education: Goal: Understanding of CV disease, CV risk reduction, and recovery process will improve Outcome: Progressing Goal: Individualized Educational Video(s) Outcome: Progressing   Problem: Activity: Goal: Ability to return to baseline activity level will improve Outcome: Progressing   Problem: Cardiovascular: Goal: Ability to achieve and maintain adequate cardiovascular perfusion will improve Outcome: Progressing Goal: Vascular access site(s) Level 0-1 will be maintained Outcome: Progressing   Problem: Health Behavior/Discharge Planning: Goal: Ability to safely manage health-related needs after discharge will improve Outcome: Progressing   Problem: Education: Goal: Knowledge of General Education information will improve Description: Including pain rating scale, medication(s)/side effects and non-pharmacologic comfort measures Outcome: Progressing   Problem: Health Behavior/Discharge Planning: Goal: Ability to manage health-related needs will improve Outcome: Progressing   Problem: Clinical Measurements: Goal: Ability to maintain clinical measurements within normal limits will improve Outcome: Progressing Goal: Will remain free from infection Outcome: Progressing Goal: Diagnostic test results will improve Outcome: Progressing Goal: Respiratory complications will improve Outcome:  Progressing Goal: Cardiovascular complication will be avoided Outcome: Progressing   Problem: Activity: Goal: Risk for activity intolerance will decrease Outcome: Progressing   Problem: Nutrition: Goal: Adequate nutrition will be maintained Outcome: Progressing   Problem: Coping: Goal: Level of anxiety will decrease Outcome: Progressing   Problem: Elimination: Goal: Will not experience complications related to bowel motility Outcome: Progressing Goal: Will not experience complications related to urinary retention Outcome: Progressing   Problem: Pain Managment: Goal: General experience of comfort will improve Outcome: Progressing   Problem: Safety: Goal: Ability to remain free from injury will improve Outcome: Progressing   Problem: Skin Integrity: Goal: Risk for impaired skin integrity will decrease Outcome: Progressing   Problem: Education: Goal: Will demonstrate proper wound care and an understanding of methods to prevent future damage Outcome: Progressing Goal: Knowledge of disease or condition will improve Outcome: Progressing Goal: Knowledge of the prescribed therapeutic regimen will improve Outcome: Progressing Goal: Individualized Educational Video(s) Outcome: Progressing   Problem: Activity: Goal: Risk for activity intolerance will decrease Outcome: Progressing   Problem: Cardiac: Goal: Will achieve and/or maintain hemodynamic stability Outcome: Progressing   Problem: Clinical Measurements: Goal: Postoperative complications will be avoided or minimized Outcome: Progressing   Problem: Respiratory: Goal: Respiratory status will improve Outcome: Progressing   Problem: Skin Integrity: Goal: Wound healing without signs and symptoms of infection Outcome: Progressing Goal: Risk for impaired skin integrity will decrease Outcome: Progressing   Problem: Urinary Elimination: Goal: Ability to achieve and maintain adequate renal perfusion and functioning  will improve Outcome: Progressing   Problem: Education: Goal: Ability to describe self-care measures that may prevent or decrease complications (Diabetes Survival Skills Education) will improve Outcome: Progressing Goal: Individualized Educational Video(s) Outcome: Progressing   Problem: Coping: Goal: Ability to adjust to condition or change in health will improve Outcome: Progressing   Problem: Fluid Volume: Goal: Ability to maintain a balanced intake and output will improve Outcome: Progressing   Problem: Health Behavior/Discharge Planning: Goal: Ability to identify and utilize available resources and services will improve Outcome: Progressing Goal: Ability to manage health-related needs will improve  Outcome: Progressing   Problem: Metabolic: Goal: Ability to maintain appropriate glucose levels will improve Outcome: Progressing   Problem: Nutritional: Goal: Maintenance of adequate nutrition will improve Outcome: Progressing Goal: Progress toward achieving an optimal weight will improve Outcome: Progressing

## 2022-05-17 LAB — BASIC METABOLIC PANEL
Anion gap: 11 (ref 5–15)
BUN: 16 mg/dL (ref 6–20)
CO2: 26 mmol/L (ref 22–32)
Calcium: 9.1 mg/dL (ref 8.9–10.3)
Chloride: 98 mmol/L (ref 98–111)
Creatinine, Ser: 0.91 mg/dL (ref 0.61–1.24)
GFR, Estimated: >60 mL/min (ref >60)
Glucose, Bld: 121 mg/dL — ABNORMAL HIGH (ref 70–99)
Potassium: 3.9 mmol/L (ref 3.5–5.1)
Sodium: 135 mmol/L (ref 135–145)

## 2022-05-17 MED ORDER — AMIODARONE LOAD VIA INFUSION
150.0000 mg | Freq: Once | INTRAVENOUS | Status: AC
  Administered 2022-05-17: 150 mg via INTRAVENOUS
  Filled 2022-05-17: qty 83.34

## 2022-05-17 MED ORDER — AMIODARONE HCL 200 MG PO TABS
400.0000 mg | ORAL_TABLET | Freq: Two times a day (BID) | ORAL | Status: DC
  Administered 2022-05-17 – 2022-05-19 (×5): 400 mg via ORAL
  Filled 2022-05-17 (×5): qty 2

## 2022-05-17 MED ORDER — AMIODARONE HCL IN DEXTROSE 360-4.14 MG/200ML-% IV SOLN: 60.0000 mg/h | INTRAVENOUS | Status: DC

## 2022-05-17 MED ORDER — AMIODARONE HCL IN DEXTROSE 360-4.14 MG/200ML-% IV SOLN
60.0000 mg/h | INTRAVENOUS | Status: AC
  Administered 2022-05-17 (×2): 60 mg/h via INTRAVENOUS
  Filled 2022-05-17 (×2): qty 200

## 2022-05-17 MED ORDER — AMLODIPINE BESYLATE 5 MG PO TABS: 5.0000 mg | ORAL_TABLET | Freq: Every day | ORAL | Status: DC

## 2022-05-17 NOTE — Progress Notes (Signed)
Rounding Note    Patient Name: Glenn Stewart Date of Encounter: 05/17/2022  [place] Cardiologist: Kirk Ruths, MD   Subjective   Patient has some chest wall discomfort   Otherwise denies CP   Did note some dizziness earlier after walking   Breathing is OK    Anxious   Did feel heart racing earlier   Inpatient Medications    Scheduled Meds:  acetaminophen  1,000 mg Oral Q6H   Or   acetaminophen (TYLENOL) oral liquid 160 mg/5 mL  1,000 mg Per Tube Q6H   amiodarone  400 mg Oral BID   amLODipine  10 mg Oral Daily   aspirin EC  325 mg Oral Daily   Or   aspirin  324 mg Per Tube Daily   atorvastatin  80 mg Oral Daily   bisacodyl  10 mg Oral Daily   Or   bisacodyl  10 mg Rectal Daily   Chlorhexidine Gluconate Cloth  6 each Topical Daily   docusate sodium  200 mg Oral Daily   enoxaparin (LOVENOX) injection  40 mg Subcutaneous QHS   furosemide  40 mg Oral Daily   isosorbide mononitrate  30 mg Oral Daily   metoprolol tartrate  37.5 mg Oral BID   pantoprazole  40 mg Oral Daily   potassium chloride  40 mEq Oral Daily   sertraline  200 mg Oral Daily   sodium chloride flush  3 mL Intravenous Q12H   tamsulosin  0.4 mg Oral Daily   Continuous Infusions:  sodium chloride     amiodarone 30 mg/hr (05/17/22 0737)   PRN Meds: sodium chloride, ALPRAZolam, alum & mag hydroxide-simeth, magnesium hydroxide, metoprolol tartrate, ondansetron (ZOFRAN) IV, mouth rinse, oxyCODONE, sodium chloride flush, traZODone   Vital Signs    Vitals:   05/17/22 0330 05/17/22 0400 05/17/22 0500 05/17/22 0946  BP: (!) 154/83 112/86 113/85 (!) 149/94  Pulse: 76 80 83   Resp: '20 20 20   '$ Temp: 98.7 F (37.1 C)     TempSrc: Oral     SpO2: 94% 93% 93%   Weight:   89.5 kg   Height:        Intake/Output Summary (Last 24 hours) at 05/17/2022 1339 Last data filed at 05/17/2022 0300 Gross per 24 hour  Intake 39.91 ml  Output --  Net 39.91 ml      05/17/2022    5:00 AM 05/16/2022     6:12 AM 05/15/2022    2:10 AM  Last 3 Weights  Weight (lbs) 197 lb 5 oz 199 lb 9.6 oz 206 lb 9.1 oz  Weight (kg) 89.5 kg 90.538 kg 93.7 kg      Telemetry    SR   SVT-Self limited  - Personally Reviewed  ECG    No new  - Personally Reviewed  Physical Exam   GEN: No acute distress.   Neck: No JVD Cardiac: RRR, no murmurs, rubs,   Respiratory: Clear to auscultation bilaterally. GI: Soft, nontender, non-distended  MS: No edema; No deformity. Neuro:  Nonfocal  Psych: Normal affect   Labs    High Sensitivity Troponin:   Recent Labs  Lab 05/10/22 0122 05/10/22 0350 05/10/22 0505  TROPONINIHS 17 19* 17     Chemistry Recent Labs  Lab 05/11/22 1010 05/12/22 0916 05/12/22 2054 05/12/22 2120 05/13/22 0422 05/13/22 1704 05/14/22 0305 05/15/22 0215 05/17/22 0743  NA  --    < > 139   < > 135 135 135 137  135  K  --    < > 4.3   < > 3.9 3.7 3.5 4.4 3.9  CL  --    < > 109  --  105 101 98 102 98  CO2  --   --  22  --  '23 26 27 27 26  '$ GLUCOSE  --    < > 120*  --  104* 131* 118* 102* 121*  BUN  --    < > 13  --  '10 11 9 14 16  '$ CREATININE  --    < > 0.88  --  0.78 0.84 0.50* 0.83 0.91  CALCIUM  --   --  8.0*  --  8.1* 8.5* 8.6* 8.7* 9.1  MG  --   --  3.5*  --  2.5* 2.4  --   --   --   PROT 7.4  --   --   --   --   --   --   --   --   ALBUMIN 4.3  --   --   --   --   --   --   --   --   AST 23  --   --   --   --   --   --   --   --   ALT 26  --   --   --   --   --   --   --   --   ALKPHOS 51  --   --   --   --   --   --   --   --   BILITOT 1.3*  --   --   --   --   --   --   --   --   GFRNONAA  --   --  >60  --  >60 >60 >60 >60 >60  ANIONGAP  --   --  8  --  '7 8 10 8 11   '$ < > = values in this interval not displayed.    Lipids No results for input(s): "CHOL", "TRIG", "HDL", "LABVLDL", "LDLCALC", "CHOLHDL" in the last 168 hours.  Hematology Recent Labs  Lab 05/13/22 1704 05/14/22 0305 05/15/22 0215  WBC 10.3 10.8* 9.6  RBC 4.33 4.44 4.42  HGB 12.7* 12.8* 12.3*  HCT  35.9* 36.8* 37.5*  MCV 82.9 82.9 84.8  MCH 29.3 28.8 27.8  MCHC 35.4 34.8 32.8  RDW 12.9 12.9 13.1  PLT 177 198 198   Thyroid No results for input(s): "TSH", "FREET4" in the last 168 hours.  BNPNo results for input(s): "BNP", "PROBNP" in the last 168 hours.  DDimer No results for input(s): "DDIMER" in the last 168 hours.   Radiology    No results found.  Cardiac Studies    05/10/22  LHC    Prox RCA to Mid RCA lesion is 100% stenosed.   1st Mrg lesion is 99% stenosed.   Mid Cx to Dist Cx lesion is 99% stenosed.   Ost LAD to Prox LAD lesion is 70% stenosed.   Mid LAD lesion is 100% stenosed.   1st Diag lesion is 99% stenosed.   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 Severe disease in a moderate caliber Diagonal branch that arises prior to the total LAD occlusion.  Large caliber Circumflex with diffuse severe distal stenosis leading into the most  distal obtuse marginal branch. (Functional CTO). The first obtuse marginal branch is a moderate caliber vessel with severe ostial stenosis.  The RCA is a large dominant artery with chronic total occlusion of the mid vessel The distal vessel fills from right to right collaterals and left to right collaterals.    Recommendations; He has severe three vessel CAD with chronic occlusion of the mid RCA, mid LAD and distal Circumflex with severe disease in the early obtuse marginal branch and in the diagonal branch. Bypass surgery appears to be the best method for revascularization. Will ask CT surgery to see him today to discuss bypass. Will resume IV heparin 2 hours post sheath pull. Continue ASA, statin and beta blocker.   Echo   05/10/22   1. Inferior basal hypokinesis . Left ventricular ejection fraction, by  estimation, is 50 to 55%. The left ventricle has low normal function. The  left ventricle demonstrates regional wall motion abnormalities (see  scoring  diagram/findings for  description). Left ventricular diastolic parameters were normal.   2. Right ventricular systolic function is normal. The right ventricular  size is normal.   3. Left atrial size was mildly dilated.   4. The mitral valve is abnormal. Trivial mitral valve regurgitation. No  evidence of mitral stenosis.   5. The aortic valve is tricuspid. There is mild calcification of the  aortic valve. Aortic valve regurgitation is not visualized. Aortic valve  sclerosis is present, with no evidence of aortic valve stenosis.   6. The inferior vena cava is normal in size with greater than 50%  respiratory variability, suggesting right atrial pressure of 3 mmHg.    Patient Profile     57 y.o. male hx of CAD, HTN, HL, anxiety/depression., ADHD admitted for [facility] for cardiac cath   [facility] on to have CABG 05/12/22    Assessment & Plan    1  SVT  Pt with limited spells of SVT   Senses heart beating fast   No dizziness   Agree with amiodarone to help stabilize in the short term. Can d/c home on 400 bid for 5 days then 400 daily    Will see as outpt in cardiology  2  NSVT   Short, self limited   3  CAD   Severe CAD  Pt is POD 5    s/p CABG (LIMA to LAD; radial to OM, SVG to D1 and Seq graft to AM/PDA)  4  Hx HTN   Pt  currently on amlodipine 10 nd metoprolol 37.5 bid and imdur 30   SBP in 90s   He is dizzy at times  I would back down on amlodipine to 5 mg    May need to back down on this or b blocker further    Follow   5 HDL   Keep on lipitor 80     WIll make sure he hs appt for follow up in 2 wks   For questions or updates, please contact [place] Please consult [email or web address] for contact info under        Signed, Dorris Carnes, MD  05/17/2022, 1:39 PM

## 2022-05-17 NOTE — Progress Notes (Signed)
Patient was in and out of non sustained SVT ranging from 149-170's starting at [place]. Episode lasted couple of seconds and was not able to obtain 12 lead EKG, and patient was right back to NSR with HR 70's-80's. at 0102 patient had 21 beats of non sustained Vtac , vitals stable. Patient was sleeping and said he felt it but it did not feel as bad as during the day. While in the room patient had another 25 beats of vatc at 0112, and another 25 beats of non sustained vtac at 0120. Patient remained non symptomatic.Dr. Kipp Brood called and notified. While talking to MD patient was having non sustained SVT. Order received for Amiodarone Bolus and drip. Patient is in the bed, AO x 4. Denies any pain, stated he is slightly uncomfortable. Charge RN made aware. Call bell within reach.Plan of care continues.

## 2022-05-17 NOTE — Progress Notes (Addendum)
      [address]       [place] [address]             [phone]        5 Days Post-Op Procedure(s) (LRB): CORONARY ARTERY BYPASS GRAFTING (CABG) X FIVE BYPASSES USING OPEN LEFT INTERNAL MAMMARY ARTERY, OPEN LEFT RADIAL ARTERY, AND ENDOSCOPIC RIGHT GREATER SAPHENOUS VEIN HARVEST. (N/A) RADIAL ARTERY HARVEST (Left) TRANSESOPHAGEAL ECHOCARDIOGRAM (N/A)  Subjective: Patient concerned about rhythm issues over night. I had a long discussion to reassure him and that Amiodarone has helped.  Objective: Vital signs in last 24 hours: Temp:  [97.8 F (36.6 C)-98.7 F (37.1 C)] 98.7 F (37.1 C) (02/26 0330) Pulse Rate:  [73-101] 83 (02/26 0500) Cardiac Rhythm: Normal sinus rhythm (02/25 1937) Resp:  [16-23] 20 (02/26 0500) BP: (99-154)/(77-98) 113/85 (02/26 0500) SpO2:  [92 %-97 %] 93 % (02/26 0500) Weight:  [89.5 kg] 89.5 kg (02/26 0500)  Pre op weight 89.8 kg Current Weight  05/17/22 89.5 kg      Intake/Output from previous day: 02/25 0701 - 02/26 0700 In: 39.9 [I.V.:39.9] Out: -    Physical Exam:  Cardiovascular: RRR Pulmonary: Slightly diminished bibasilar breath sounds Abdomen: Soft, non tender, bowel sounds present. Extremities: Trace bilateral lower extremity edema. Motor/senory LUE intact Wounds: Sternal, LUE, and RLE wounds are all clean and dry.  No erythema or signs of infection. There is no sternal instability (wires intact on CXR yesterday).  Lab Results: CBC: Recent Labs    05/15/22 0215  WBC 9.6  HGB 12.3*  HCT 37.5*  PLT 198    BMET:  Recent Labs    05/15/22 0215  NA 137  K 4.4  CL 102  CO2 27  GLUCOSE 102*  BUN 14  CREATININE 0.83  CALCIUM 8.7*     PT/INR:  Lab Results  Component Value Date   INR 1.3 (H) 05/12/2022   INR 1.0 05/10/2022   ABG:  INR: Will add last result for INR, ABG once components are confirmed Will add last 4 CBG results once components are confirmed  Assessment/Plan: CV-SR,  hypertensive. Yesterday after 1 pm, he had brief AF with HR into the 180's and spontaneously converted to SR. Earlier this am, he had several runs of NSVT. He was given Amiodarone bolus and put on Amiodarone drip. On Imdur 30 mg daily, Lopressor 37.5 mg bid, Amlodipine 10 mg daily, and Lisinopril 10 mg daily. Transition to oral Amiodarone. Will stop Lisinopril for now as BP softer. Pulmonary-on room air. Encourage incentive spirometer Volume overload-on Lasix 40 mg daily Expected post op blood loss anemia-H and H yesterday stable at 12.3 and 37.5 5. CBGs 165/110/103. Pre op HGA1C 5.4. Stop accu checks and SS PRN 6. On Lovenox for DVT prophylaxis 7. Check BMET this am 8. Monitor today;likely discharge in am if maintains SR  Glenn Stewart 6:58 AM  Agree with above SVT overnight, back in sinus Will watch 1 more day  Glenn Stewart

## 2022-05-17 NOTE — Progress Notes (Signed)
Mobility Specialist Progress Note   05/17/22 1522  Mobility  Activity Ambulated independently in hallway  Level of Assistance Independent  Assistive Device None  Distance Ambulated (ft) 240 ft  Range of Motion/Exercises Active;All extremities  Activity Response Tolerated well   Patient Agreeable to participate. Ambulated independently with steady gait. Tolerated without complaint or incident. Was left standing at sink with all needs met, call bell in reach.   [place] Germaine Ripp, BS EXP Mobility Specialist Please contact via SecureChat or Rehab office at [phone]

## 2022-05-17 NOTE — Progress Notes (Signed)
Amiodarone bolus started at [place], patient was having non sustained episodes of SVT and Vtac during Amio infusion. Patient remained non symptomatic. Patient educated on Amio drip. Call bell within reach.  BP 114/81.Plan of care continues.

## 2022-05-17 NOTE — Progress Notes (Addendum)
CARDIAC REHAB PHASE I   PRE:  Rate/Rhythm: 96 NSR  BP:  Sitting: 94/65   Sitting:108/73      SaO2:RA  MODE:  Ambulation: 470 ft   AD:  None  POST:  Rate/Rhythm: 103 ST  BP:  Sitting: 95/65      SaO2: RA  Prior to ambulation pt describe feeling very dizzy after standing, so pt was seated quickly and repeat BP was taken. Pt felt good enough to walk after about two minutes of rest and we walked the entire hallway w/o dizziness, CP, and SOB. When we returned to his room, the pt reported feeling dizzy after sitting down. BP was taken again and pt is a little anxious about BP being low. Will message nurse.   Christen Bame  1:34 PM 05/17/2022    Service time is from 1310 to 1343.

## 2022-05-18 ENCOUNTER — Other Ambulatory Visit (HOSPITAL_COMMUNITY): Payer: Self-pay

## 2022-05-18 LAB — BASIC METABOLIC PANEL
Anion gap: 13 (ref 5–15)
BUN: 24 mg/dL — ABNORMAL HIGH (ref 6–20)
CO2: 22 mmol/L (ref 22–32)
Calcium: 9.2 mg/dL (ref 8.9–10.3)
Chloride: 98 mmol/L (ref 98–111)
Creatinine, Ser: 1.01 mg/dL (ref 0.61–1.24)
GFR, Estimated: >60 mL/min (ref >60)
Glucose, Bld: 115 mg/dL — ABNORMAL HIGH (ref 70–99)
Potassium: 3.9 mmol/L (ref 3.5–5.1)
Sodium: 133 mmol/L — ABNORMAL LOW (ref 135–145)

## 2022-05-18 LAB — MAGNESIUM: Magnesium: 2.1 mg/dL (ref 1.7–2.4)

## 2022-05-18 MED ORDER — METOPROLOL TARTRATE 50 MG PO TABS
50.0000 mg | ORAL_TABLET | Freq: Two times a day (BID) | ORAL | Status: DC
  Administered 2022-05-18 – 2022-05-19 (×3): 50 mg via ORAL
  Filled 2022-05-18 (×3): qty 1

## 2022-05-18 MED ORDER — AMLODIPINE BESYLATE 5 MG PO TABS
2.5000 mg | ORAL_TABLET | Freq: Every day | ORAL | Status: DC
  Administered 2022-05-18: 2.5 mg via ORAL
  Filled 2022-05-18: qty 1

## 2022-05-18 MED ORDER — ASPIRIN 81 MG PO TBEC
81.0000 mg | DELAYED_RELEASE_TABLET | Freq: Every day | ORAL | Status: DC
  Administered 2022-05-18 – 2022-05-19 (×2): 81 mg via ORAL
  Filled 2022-05-18 (×2): qty 1

## 2022-05-18 MED ORDER — AMIODARONE IV BOLUS ONLY 150 MG/100ML
150.0000 mg | Freq: Once | INTRAVENOUS | Status: AC
  Administered 2022-05-18: 150 mg via INTRAVENOUS
  Filled 2022-05-18: qty 100

## 2022-05-18 MED ORDER — PSYLLIUM 95 % PO PACK
1.0000 | PACK | Freq: Every day | ORAL | Status: DC
  Administered 2022-05-18 – 2022-05-19 (×2): 1 via ORAL
  Filled 2022-05-18 (×2): qty 1

## 2022-05-18 MED ORDER — APIXABAN 5 MG PO TABS
5.0000 mg | ORAL_TABLET | Freq: Two times a day (BID) | ORAL | Status: DC
  Administered 2022-05-18 – 2022-05-19 (×3): 5 mg via ORAL
  Filled 2022-05-18 (×3): qty 1

## 2022-05-18 MED ORDER — ACETAMINOPHEN 325 MG PO TABS
650.0000 mg | ORAL_TABLET | ORAL | Status: DC | PRN
  Administered 2022-05-18 – 2022-05-19 (×2): 650 mg via ORAL
  Filled 2022-05-18 (×2): qty 2

## 2022-05-18 NOTE — Progress Notes (Addendum)
      [address]       [place] [address]             [phone]        6 Days Post-Op Procedure(s) (LRB): CORONARY ARTERY BYPASS GRAFTING (CABG) X FIVE BYPASSES USING OPEN LEFT INTERNAL MAMMARY ARTERY, OPEN LEFT RADIAL ARTERY, AND ENDOSCOPIC RIGHT GREATER SAPHENOUS VEIN HARVEST. (N/A) RADIAL ARTERY HARVEST (Left) TRANSESOPHAGEAL ECHOCARDIOGRAM (N/A)  Subjective: Patient  Objective: Vital signs in last 24 hours: Temp:  [98.6 F (37 C)] 98.6 F (37 C) (02/27 0324) Pulse Rate:  [74-99] 78 (02/27 0500) Cardiac Rhythm: Ventricular tachycardia (02/27 0423) Resp:  [16-27] 20 (02/27 0500) BP: (115-149)/(77-94) 121/87 (02/27 0500) SpO2:  [90 %-96 %] 94 % (02/27 0500)  Pre op weight 89.8 kg Current Weight  05/17/22 89.5 kg      Intake/Output from previous day: 02/26 0701 - 02/27 0700 In: 3 [I.V.:3] Out: -    Physical Exam:  Cardiovascular: RRR Pulmonary: Slightly diminished bibasilar breath sounds Abdomen: Soft, non tender, bowel sounds present. Extremities: Trace bilateral lower extremity edema. Motor/senory LUE intact Wounds: Sternal, LUE, and RLE wounds are all clean and dry.  No erythema or signs of infection. There is no sternal instability (wires intact on CXR yesterday).  Lab Results: CBC: No results for input(s): "WBC", "HGB", "HCT", "PLT" in the last 72 hours.  BMET:  Recent Labs    05/17/22 0743  NA 135  K 3.9  CL 98  CO2 26  GLUCOSE 121*  BUN 16  CREATININE 0.91  CALCIUM 9.1     PT/INR:  Lab Results  Component Value Date   INR 1.3 (H) 05/12/2022   INR 1.0 05/10/2022   ABG:  INR: Will add last result for INR, ABG once components are confirmed Will add last 4 CBG results once components are confirmed  Assessment/Plan: CV-Sunday, brief a fib with RVR, followed by runs of SVT. A fib with HR in the 120's this am. He was given Amiodarone bolus this am .On Amiodarone 400 mg bid, Imdur 30 mg daily, Lopressor 37.5 mg bid,  Amlodipine 2.5 mg daily. Will increase Lopressor to 50 mg bid. May have to restart Amiodarone drip. Pulmonary-on room air. Encourage incentive spirometer Volume overload-on Lasix 40 mg daily Expected post op blood loss anemia-Last H and H yesterday stable at 12.3 and 37.5 5. On Lovenox for DVT prophylaxis 6. Monitor today;possibly discharge in once rhythm issues resolved  Donielle M ZimmermanPA-C 6:58 AM  Patient seen and examined, agree with above In A fib with VR in 120s- rebolused amiodarone, Lopressor increased Will start [place] when rhythm controlled  Remo Lipps C. Roxan Hockey, MD [facility] [phone]

## 2022-05-18 NOTE — Progress Notes (Signed)
CARDIAC REHAB PHASE I   PRE:  Rate/Rhythm: 82 A-fib  BP:  Sitting: 128/95      SaO2: 96 RA  MODE:  Ambulation: 470 ft   POST:  Rate/Rhythm: 99 A-fib  BP:  Sitting: 121/86      SaO2: 98 RA  Pt ambulated in hall, independent oob to standing and walking. Tolerated well with no SOB, pain or dizziness. Pt in bathroom for possible bm, with call bell in reach. Will call for assistance if needed. Pt  ambulating in room independently, tolerating well. Will continue to follow.   1100-1130  Vanessa Barbara, RN BSN 05/18/2022 11:27 AM

## 2022-05-18 NOTE — Plan of Care (Signed)
Problem: Education: Goal: Understanding of cardiac disease, CV risk reduction, and recovery process will improve Outcome: Progressing Goal: Individualized Educational Video(s) Outcome: Progressing   Problem: Activity: Goal: Ability to tolerate increased activity will improve Outcome: Progressing   Problem: Cardiac: Goal: Ability to achieve and maintain adequate cardiovascular perfusion will improve Outcome: Progressing   Problem: Health Behavior/Discharge Planning: Goal: Ability to safely manage health-related needs after discharge will improve Outcome: Progressing   Problem: Education: Goal: Understanding of CV disease, CV risk reduction, and recovery process will improve Outcome: Progressing Goal: Individualized Educational Video(s) Outcome: Progressing   Problem: Activity: Goal: Ability to return to baseline activity level will improve Outcome: Progressing   Problem: Cardiovascular: Goal: Ability to achieve and maintain adequate cardiovascular perfusion will improve Outcome: Progressing Goal: Vascular access site(s) Level 0-1 will be maintained Outcome: Progressing   Problem: Health Behavior/Discharge Planning: Goal: Ability to safely manage health-related needs after discharge will improve Outcome: Progressing   Problem: Education: Goal: Knowledge of General Education information will improve Description: Including pain rating scale, medication(s)/side effects and non-pharmacologic comfort measures Outcome: Progressing   Problem: Health Behavior/Discharge Planning: Goal: Ability to manage health-related needs will improve Outcome: Progressing   Problem: Clinical Measurements: Goal: Ability to maintain clinical measurements within normal limits will improve Outcome: Progressing Goal: Will remain free from infection Outcome: Progressing Goal: Diagnostic test results will improve Outcome: Progressing Goal: Respiratory complications will improve Outcome:  Progressing Goal: Cardiovascular complication will be avoided Outcome: Progressing   Problem: Activity: Goal: Risk for activity intolerance will decrease Outcome: Progressing   Problem: Nutrition: Goal: Adequate nutrition will be maintained Outcome: Progressing   Problem: Coping: Goal: Level of anxiety will decrease Outcome: Progressing   Problem: Elimination: Goal: Will not experience complications related to bowel motility Outcome: Progressing Goal: Will not experience complications related to urinary retention Outcome: Progressing   Problem: Pain Managment: Goal: General experience of comfort will improve Outcome: Progressing   Problem: Safety: Goal: Ability to remain free from injury will improve Outcome: Progressing   Problem: Skin Integrity: Goal: Risk for impaired skin integrity will decrease Outcome: Progressing   Problem: Education: Goal: Will demonstrate proper wound care and an understanding of methods to prevent future damage Outcome: Progressing Goal: Knowledge of disease or condition will improve Outcome: Progressing Goal: Knowledge of the prescribed therapeutic regimen will improve Outcome: Progressing Goal: Individualized Educational Video(s) Outcome: Progressing   Problem: Activity: Goal: Risk for activity intolerance will decrease Outcome: Progressing   Problem: Cardiac: Goal: Will achieve and/or maintain hemodynamic stability Outcome: Progressing   Problem: Clinical Measurements: Goal: Postoperative complications will be avoided or minimized Outcome: Progressing   Problem: Respiratory: Goal: Respiratory status will improve Outcome: Progressing   Problem: Skin Integrity: Goal: Wound healing without signs and symptoms of infection Outcome: Progressing Goal: Risk for impaired skin integrity will decrease Outcome: Progressing   Problem: Urinary Elimination: Goal: Ability to achieve and maintain adequate renal perfusion and functioning  will improve Outcome: Progressing   Problem: Education: Goal: Ability to describe self-care measures that may prevent or decrease complications (Diabetes Survival Skills Education) will improve Outcome: Progressing Goal: Individualized Educational Video(s) Outcome: Progressing   Problem: Coping: Goal: Ability to adjust to condition or change in health will improve Outcome: Progressing   Problem: Fluid Volume: Goal: Ability to maintain a balanced intake and output will improve Outcome: Progressing   Problem: Health Behavior/Discharge Planning: Goal: Ability to identify and utilize available resources and services will improve Outcome: Progressing Goal: Ability to manage health-related needs will improve  Outcome: Progressing   Problem: Metabolic: Goal: Ability to maintain appropriate glucose levels will improve Outcome: Progressing   Problem: Nutritional: Goal: Maintenance of adequate nutrition will improve Outcome: Progressing Goal: Progress toward achieving an optimal weight will improve Outcome: Progressing

## 2022-05-18 NOTE — Progress Notes (Addendum)
ANTICOAGULATION CONSULT NOTE - Initial Consult  Pharmacy Consult for Apixaban Indication: atrial fibrillation  Allergies  Allergen Reactions   Sulfonamide Derivatives     REACTION: hives    Patient Measurements: Height: '5\' 6"'$  (167.6 cm) Weight: 89.7 kg (197 lb 12 oz) IBW/kg (Calculated) : 63.8  Vital Signs: Temp: 98.7 F (37.1 C) (02/27 0728) Temp Source: Oral (02/27 0728) BP: 129/102 (02/27 0830) Pulse Rate: 112 (02/27 0830)  Labs: Recent Labs    05/17/22 0743 05/18/22 0612  CREATININE 0.91 1.01    Estimated Creatinine Clearance: 84.7 mL/min (by C-G formula based on SCr of 1.01 mg/dL).   Medical History: Past Medical History:  Diagnosis Date   ADHD (attention deficit hyperactivity disorder)    Anxiety state, unspecified    Bipolar 1 disorder ([place])    CHEST PAIN 12/04/2008   Qualifier: Diagnosis of  By: Glori Bickers MD, Carmell [place]    Depressive disorder, not elsewhere classified    Dermatophytosis of nail    Esophageal reflux    HYPERGLYCEMIA 07/10/2007   Qualifier: Diagnosis of  By: Glori Bickers MD, Carmell [place]    Kidney stones 09/22/2010   Low back pain 09/18/2013   Other chronic nonalcoholic liver disease    Problems with hearing    Prostatitis, unspecified    Pure hypercholesterolemia    Screen for STD (sexually transmitted disease) 04/20/2011   Sprain of cruciate ligament of knee    Tear of medial cartilage or meniscus of knee, current    TMJ (temporomandibular joint disorder) 12/25/2010   Unspecified essential hypertension    Unspecified gastritis and gastroduodenitis without mention of hemorrhage    Urethritis, unspecified     Medications:  Medications Prior to Admission  Medication Sig Dispense Refill Last Dose   ALPRAZolam (XANAX) 1 MG tablet Take 1 mg by mouth Once daily as needed for anxiety.   05/09/2022 at 2230   amLODipine (NORVASC) 10 MG tablet TAKE 2 TABLETS BY MOUTH DAILY AS DIRECTED (Patient taking differently: Take 10 mg by mouth daily.) 180 tablet 3  05/09/2022 at am   aspirin 81 MG chewable tablet Chew 1 tablet (81 mg total) by mouth daily. 30 tablet 0 05/09/2022 at am   fish oil-omega-3 fatty acids 1000 MG capsule Take 2 g by mouth daily.   05/09/2022 at am   isosorbide mononitrate (IMDUR) 30 MG 24 hr tablet Take 1 tablet (30 mg total) by mouth daily. 30 tablet 1 05/09/2022 at am   metoprolol succinate (TOPROL-XL) 25 MG 24 hr tablet Take 1 tablet (25 mg total) by mouth daily. 30 tablet 1 05/09/2022 at 0930   Multiple Vitamin (MULTIVITAMIN) capsule Take 1 capsule by mouth daily.   05/09/2022   nitroGLYCERIN (NITROSTAT) 0.4 MG SL tablet Place 1 tablet (0.4 mg total) under the tongue every 5 (five) minutes as needed for chest pain. 30 tablet 3 05/09/2022   sertraline (ZOLOFT) 100 MG tablet Take 200 mg by mouth daily.   05/09/2022 at am   simvastatin (ZOCOR) 20 MG tablet Take 20 mg by mouth at bedtime.   05/09/2022 at pm   tamsulosin (FLOMAX) 0.4 MG CAPS capsule Take 0.4 mg by mouth daily.   05/09/2022 at am   traZODone (DESYREL) 50 MG tablet Take 100 mg by mouth at bedtime as needed for sleep.   05/09/2022 at pm   Scheduled:   amiodarone  400 mg Oral BID   amLODipine  2.5 mg Oral Daily   apixaban  5 mg Oral BID   aspirin EC  81 mg Oral Daily   atorvastatin  80 mg Oral Daily   bisacodyl  10 mg Oral Daily   Or   bisacodyl  10 mg Rectal Daily   Chlorhexidine Gluconate Cloth  6 each Topical Daily   docusate sodium  200 mg Oral Daily   furosemide  40 mg Oral Daily   isosorbide mononitrate  30 mg Oral Daily   metoprolol tartrate  50 mg Oral BID   pantoprazole  40 mg Oral Daily   potassium chloride  40 mEq Oral Daily   sertraline  200 mg Oral Daily   sodium chloride flush  3 mL Intravenous Q12H   tamsulosin  0.4 mg Oral Daily    Assessment: 57 yo M s/p CABG x5v on 05/12/22 developed post-op Afib on 2/25, followed by runs of SVT, then returned to NSR. Pt went back into Afib w/ RVR on 2/27. Pt was not taking any anticoagulation prior to admission. Pt  was initiated on VTE ppx with enoxaparin post-op. Pharmacy consulted to dose apixaban for AFib.  Hgb 12.3, Plt 198 (labs from 2/24) - stable post-operatively No s/sx of bleeding noted  Goal of Therapy:  Monitor platelets by anticoagulation protocol: Yes   Plan:  Discontinue enoxaparin Start apixaban '5mg'$  PO BID Aspirin dose decreased to '81mg'$  po daily Pt will need assistance with medication costs using MATCH due to lack of health/Rx insurance - case manager notified of need to cover apixaban Pharmacy to provide patient education on apixaban prior to discharge  Luisa Hart, PharmD, BCPS Clinical Pharmacist 05/18/2022 9:57 AM   Please refer to [facility] for pharmacy phone number

## 2022-05-18 NOTE — Progress Notes (Signed)
Rounding Note    Patient Name: Glenn Stewart Date of Encounter: 05/18/2022  [place] Cardiologist: Kirk Ruths, MD   Subjective   Patient has some chest wall discomfort   Otherwise denies CP   Did note some dizziness earlier after walking   Breathing is OK    Anxious   Did feel heart racing earlier   Inpatient Medications    Scheduled Meds:  amiodarone  400 mg Oral BID   amLODipine  2.5 mg Oral Daily   apixaban  5 mg Oral BID   aspirin EC  81 mg Oral Daily   atorvastatin  80 mg Oral Daily   bisacodyl  10 mg Oral Daily   Or   bisacodyl  10 mg Rectal Daily   Chlorhexidine Gluconate Cloth  6 each Topical Daily   docusate sodium  200 mg Oral Daily   furosemide  40 mg Oral Daily   isosorbide mononitrate  30 mg Oral Daily   metoprolol tartrate  50 mg Oral BID   pantoprazole  40 mg Oral Daily   potassium chloride  40 mEq Oral Daily   sertraline  200 mg Oral Daily   sodium chloride flush  3 mL Intravenous Q12H   tamsulosin  0.4 mg Oral Daily   Continuous Infusions:  sodium chloride     PRN Meds: sodium chloride, ALPRAZolam, alum & mag hydroxide-simeth, magnesium hydroxide, metoprolol tartrate, ondansetron (ZOFRAN) IV, mouth rinse, oxyCODONE, sodium chloride flush, traZODone   Vital Signs    Vitals:   05/18/22 0728 05/18/22 0830 05/18/22 1100 05/18/22 1119  BP: (!) 128/95 (!) 129/102 109/81 121/86  Pulse: 62 (!) 112 82   Resp: 20  (!) 22 (!) 21  Temp: 98.7 F (37.1 C)   98.7 F (37.1 C)  TempSrc: Oral   Oral  SpO2: 91%     Weight:      Height:        Intake/Output Summary (Last 24 hours) at 05/18/2022 1232 Last data filed at 05/18/2022 0700 Gross per 24 hour  Intake 243 ml  Output 500 ml  Net -257 ml      05/18/2022    7:09 AM 05/17/2022    5:00 AM 05/16/2022    6:12 AM  Last 3 Weights  Weight (lbs) 197 lb 12 oz 197 lb 5 oz 199 lb 9.6 oz  Weight (kg) 89.7 kg 89.5 kg 90.538 kg      Telemetry    SR   SVT-Self limited  - Personally  Reviewed  ECG    No new  - Personally Reviewed  Physical Exam   GEN: No acute distress.   Neck: No JVD Cardiac: RRR, no murmurs, rubs,   Respiratory: Clear to auscultation bilaterally. GI: Soft, nontender, non-distended  MS: No edema; No deformity. Neuro:  Nonfocal  Psych: Normal affect   Labs    High Sensitivity Troponin:   Recent Labs  Lab 05/10/22 0122 05/10/22 0350 05/10/22 0505  TROPONINIHS 17 19* 17     Chemistry Recent Labs  Lab 05/13/22 0422 05/13/22 1704 05/14/22 0305 05/15/22 0215 05/17/22 0743 05/18/22 0612  NA 135 135   < > 137 135 133*  K 3.9 3.7   < > 4.4 3.9 3.9  CL 105 101   < > 102 98 98  CO2 23 26   < > '27 26 22  '$ GLUCOSE 104* 131*   < > 102* 121* 115*  BUN 10 11   < > 14 16  24*  CREATININE 0.78 0.84   < > 0.83 0.91 1.01  CALCIUM 8.1* 8.5*   < > 8.7* 9.1 9.2  MG 2.5* 2.4  --   --   --  2.1  GFRNONAA >60 >60   < > >60 >60 >60  ANIONGAP 7 8   < > '8 11 13   '$ < > = values in this interval not displayed.    Lipids No results for input(s): "CHOL", "TRIG", "HDL", "LABVLDL", "LDLCALC", "CHOLHDL" in the last 168 hours.  Hematology Recent Labs  Lab 05/13/22 1704 05/14/22 0305 05/15/22 0215  WBC 10.3 10.8* 9.6  RBC 4.33 4.44 4.42  HGB 12.7* 12.8* 12.3*  HCT 35.9* 36.8* 37.5*  MCV 82.9 82.9 84.8  MCH 29.3 28.8 27.8  MCHC 35.4 34.8 32.8  RDW 12.9 12.9 13.1  PLT 177 198 198   Thyroid No results for input(s): "TSH", "FREET4" in the last 168 hours.  BNPNo results for input(s): "BNP", "PROBNP" in the last 168 hours.  DDimer No results for input(s): "DDIMER" in the last 168 hours.   Radiology    No results found.  Cardiac Studies    05/10/22  LHC    Prox RCA to Mid RCA lesion is 100% stenosed.   1st Mrg lesion is 99% stenosed.   Mid Cx to Dist Cx lesion is 99% stenosed.   Ost LAD to Prox LAD lesion is 70% stenosed.   Mid LAD lesion is 100% stenosed.   1st Diag lesion is 99% stenosed.   Severe three vessel CAD Chronic total occlusion  mid LAD with filling of the mid and distal LAD from left to left collaterals. Severe disease in a moderate caliber Diagonal branch that arises prior to the total LAD occlusion.  Large caliber Circumflex with diffuse severe distal stenosis leading into the most distal obtuse marginal branch. (Functional CTO). The first obtuse marginal branch is a moderate caliber vessel with severe ostial stenosis.  The RCA is a large dominant artery with chronic total occlusion of the mid vessel The distal vessel fills from right to right collaterals and left to right collaterals.    Recommendations; He has severe three vessel CAD with chronic occlusion of the mid RCA, mid LAD and distal Circumflex with severe disease in the early obtuse marginal branch and in the diagonal branch. Bypass surgery appears to be the best method for revascularization. Will ask CT surgery to see him today to discuss bypass. Will resume IV heparin 2 hours post sheath pull. Continue ASA, statin and beta blocker.   Echo   05/10/22   1. Inferior basal hypokinesis . Left ventricular ejection fraction, by  estimation, is 50 to 55%. The left ventricle has low normal function. The  left ventricle demonstrates regional wall motion abnormalities (see  scoring diagram/findings for  description). Left ventricular diastolic parameters were normal.   2. Right ventricular systolic function is normal. The right ventricular  size is normal.   3. Left atrial size was mildly dilated.   4. The mitral valve is abnormal. Trivial mitral valve regurgitation. No  evidence of mitral stenosis.   5. The aortic valve is tricuspid. There is mild calcification of the  aortic valve. Aortic valve regurgitation is not visualized. Aortic valve  sclerosis is present, with no evidence of aortic valve stenosis.   6. The inferior vena cava is normal in size with greater than 50%  respiratory variability, suggesting right atrial pressure of 3 mmHg.    Patient Profile  57 y.o. male hx of CAD, HTN, HL, anxiety/depression., ADHD admitted for SSCP for cardiac cath   [facility] on to have CABG 05/12/22    Assessment & Plan    1  Rhythm.  Pt with afib/flutter today   On po amiodarone   IV amio given   Pt back in SR    Eliquis added       2  NSVT   Short, self limited events   3  CAD   Severe CAD  Pt is POD 5    s/p CABG (LIMA to LAD; radial to OM, SVG to D1 and Seq graft to AM/PDA)  4  Hx HTN   BP is better since backing down on amlodipine a bit  Follow   5 HDL   Keep on lipitor 80     WIll make sure he hs appt for follow up in 2 wks   For questions or updates, please contact [place] Please consult [email or web address] for contact info under        Signed, Dorris Carnes, MD  05/18/2022, 12:32 PM

## 2022-05-19 ENCOUNTER — Other Ambulatory Visit (HOSPITAL_COMMUNITY): Payer: Self-pay

## 2022-05-19 LAB — CBC
HCT: 39.9 % (ref 39.0–52.0)
Hemoglobin: 13.5 g/dL (ref 13.0–17.0)
MCH: 28.1 pg (ref 26.0–34.0)
MCHC: 33.8 g/dL (ref 30.0–36.0)
MCV: 83.1 fL (ref 80.0–100.0)
Platelets: 324 10*3/uL (ref 150–400)
RBC: 4.8 MIL/uL (ref 4.22–5.81)
RDW: 12.5 % (ref 11.5–15.5)
WBC: 8.2 10*3/uL (ref 4.0–10.5)
nRBC: 0 % (ref 0.0–0.2)

## 2022-05-19 MED ORDER — POTASSIUM CHLORIDE CRYS ER 20 MEQ PO TBCR: 20.0000 meq | EXTENDED_RELEASE_TABLET | Freq: Every day | ORAL | 0 refills | Status: DC

## 2022-05-19 MED ORDER — FUROSEMIDE 40 MG PO TABS: 40.0000 mg | ORAL_TABLET | Freq: Every day | ORAL | 0 refills | Status: DC

## 2022-05-19 MED ORDER — OXYCODONE HCL 5 MG PO TABS
5.0000 mg | ORAL_TABLET | Freq: Four times a day (QID) | ORAL | 0 refills | Status: DC | PRN
  Filled 2022-05-19: qty 28, 7d supply, fill #0

## 2022-05-19 MED ORDER — AMIODARONE HCL 200 MG PO TABS: ORAL_TABLET | ORAL | 1 refills | Status: DC

## 2022-05-19 MED ORDER — APIXABAN 5 MG PO TABS
5.0000 mg | ORAL_TABLET | Freq: Two times a day (BID) | ORAL | 1 refills | Status: DC
  Filled 2022-05-19: qty 60, 30d supply, fill #0

## 2022-05-19 MED ORDER — AMIODARONE HCL 200 MG PO TABS
ORAL_TABLET | ORAL | 1 refills | Status: DC
  Filled 2022-05-19: qty 38, 30d supply, fill #0

## 2022-05-19 MED ORDER — AMLODIPINE BESYLATE 5 MG PO TABS
5.0000 mg | ORAL_TABLET | Freq: Every day | ORAL | Status: DC
  Administered 2022-05-19: 5 mg via ORAL
  Filled 2022-05-19: qty 1

## 2022-05-19 MED ORDER — ACETAMINOPHEN 325 MG PO TABS: 650.0000 mg | ORAL_TABLET | ORAL | Status: DC | PRN

## 2022-05-19 MED ORDER — AMLODIPINE BESYLATE 5 MG PO TABS
5.0000 mg | ORAL_TABLET | Freq: Every day | ORAL | 1 refills | Status: DC
  Filled 2022-05-19: qty 30, 30d supply, fill #0

## 2022-05-19 MED ORDER — POTASSIUM CHLORIDE CRYS ER 20 MEQ PO TBCR
20.0000 meq | EXTENDED_RELEASE_TABLET | Freq: Every day | ORAL | 0 refills | Status: DC
  Filled 2022-05-19: qty 5, 5d supply, fill #0

## 2022-05-19 MED ORDER — METOPROLOL TARTRATE 50 MG PO TABS
50.0000 mg | ORAL_TABLET | Freq: Two times a day (BID) | ORAL | 1 refills | Status: DC
  Filled 2022-05-19: qty 60, 30d supply, fill #0

## 2022-05-19 MED ORDER — ATORVASTATIN CALCIUM 80 MG PO TABS
80.0000 mg | ORAL_TABLET | Freq: Every day | ORAL | 1 refills | Status: DC
  Filled 2022-05-19: qty 30, 30d supply, fill #0

## 2022-05-19 MED ORDER — OXYCODONE HCL 5 MG PO TABS: 5.0000 mg | ORAL_TABLET | Freq: Four times a day (QID) | ORAL | 0 refills | Status: DC | PRN

## 2022-05-19 MED ORDER — APIXABAN 5 MG PO TABS: 5.0000 mg | ORAL_TABLET | Freq: Two times a day (BID) | ORAL | 1 refills | Status: DC

## 2022-05-19 MED ORDER — METOPROLOL TARTRATE 50 MG PO TABS: 50.0000 mg | ORAL_TABLET | Freq: Two times a day (BID) | ORAL | 1 refills | Status: DC

## 2022-05-19 MED ORDER — ASPIRIN 81 MG PO TBEC: 81.0000 mg | DELAYED_RELEASE_TABLET | Freq: Every day | ORAL | 12 refills | Status: DC

## 2022-05-19 MED ORDER — ATORVASTATIN CALCIUM 80 MG PO TABS: 80.0000 mg | ORAL_TABLET | Freq: Every day | ORAL | 1 refills | Status: DC

## 2022-05-19 MED ORDER — FUROSEMIDE 40 MG PO TABS
40.0000 mg | ORAL_TABLET | Freq: Every day | ORAL | 0 refills | Status: DC
  Filled 2022-05-19: qty 5, 5d supply, fill #0

## 2022-05-19 MED ORDER — AMLODIPINE BESYLATE 5 MG PO TABS: 5.0000 mg | ORAL_TABLET | Freq: Every day | ORAL | 1 refills | Status: DC

## 2022-05-19 NOTE — Progress Notes (Addendum)
      [address]       [place] [address]             [phone]        7 Days Post-Op Procedure(s) (LRB): CORONARY ARTERY BYPASS GRAFTING (CABG) X FIVE BYPASSES USING OPEN LEFT INTERNAL MAMMARY ARTERY, OPEN LEFT RADIAL ARTERY, AND ENDOSCOPIC RIGHT GREATER SAPHENOUS VEIN HARVEST. (N/A) RADIAL ARTERY HARVEST (Left) TRANSESOPHAGEAL ECHOCARDIOGRAM (N/A)  Subjective: Patient sleeping and awakened. He is encouraged that he is back in sinus rhythm  Objective: Vital signs in last 24 hours: Temp:  [97.6 F (36.4 C)-98.7 F (37.1 C)] 98.4 F (36.9 C) (02/28 0454) Pulse Rate:  [62-112] 79 (02/28 0454) Cardiac Rhythm: Normal sinus rhythm (02/27 1904) Resp:  [18-22] 20 (02/28 0500) BP: (109-130)/(78-102) 130/93 (02/28 0454) SpO2:  [91 %-93 %] 92 % (02/28 0454) Weight:  [89.7 kg] 89.7 kg (02/28 0500)  Pre op weight 89.8 kg Current Weight  05/19/22 89.7 kg      Intake/Output from previous day: No intake/output data recorded.   Physical Exam:  Cardiovascular: RRR Pulmonary: Mostly clear Abdomen: Soft, non tender, bowel sounds present. Extremities: Trace bilateral lower extremity edema. Motor/senory LUE intact Wounds: Sternal, LUE, and RLE wounds are all clean and dry.  No erythema or signs of infection.   Lab Results: CBC: Recent Labs    05/19/22 0113  WBC 8.2  HGB 13.5  HCT 39.9  PLT 324    BMET:  Recent Labs    05/17/22 0743 05/18/22 0612  NA 135 133*  K 3.9 3.9  CL 98 98  CO2 26 22  GLUCOSE 121* 115*  BUN 16 24*  CREATININE 0.91 1.01  CALCIUM 9.1 9.2     PT/INR:  Lab Results  Component Value Date   INR 1.3 (H) 05/12/2022   INR 1.0 05/10/2022   ABG:  INR: Will add last result for INR, ABG once components are confirmed Will add last 4 CBG results once components are confirmed  Assessment/Plan: CV-On Sunday, brief a fib with RVR, followed by runs of SVT. A fib with RVR yesterday. He was given Amiodarone bolus and Lopressor  was increased to 50 mg bid. He was also started on Apixaban. On Amiodarone 400 mg bid, Imdur 30 mg daily, Amlodipine 2.5 mg daily. Will increase Amlodipine to 5. Pulmonary-on room air. Encourage incentive spirometer Volume overload-on Lasix 40 mg daily Expected post op blood loss anemia resolved-H and H this am stable at 13.5 and 39.9 5. On Lovenox for DVT prophylaxis 6. Monitor today;possibly discharge later today as long as a fib rate controlled vs am  Donielle M ZimmermanPA-C 7:03 AM  Patient seen and examined, agree with above, except not on Lovenox as he is on [employer] C. Roxan Hockey, MD [facility] [phone]

## 2022-05-19 NOTE — Progress Notes (Signed)
Patient's pharmacy is listed as [facility]. Initially, all prescriptions were sent there. However, most of the prescriptions were returned (an error in EPIC) that transmission failed. As a result, I sent all prescriptions to [facility] to ensure he was given all of his medications and none were missed. Patient informed of this.

## 2022-05-19 NOTE — Progress Notes (Signed)
Discharge instructions given. Patient verbalized understanding and all questions were answered.  

## 2022-05-19 NOTE — TOC Transition Note (Signed)
Transition of Care (TOC) - CM/SW Discharge Note Marvetta Gibbons RN, BSN Transitions of Care Unit 4E- RN Case Manager See Treatment Team for direct phone #   Patient Details  Name: Glenn Stewart MRN: [id number] Date of Birth: 1965/06/09  [facility] CM/SW Contact:  Dawayne Patricia, RN Phone Number: 05/19/2022, 12:57 PM   Clinical Narrative:    Pt stable for transition home today, plan to go stay with sister.  Pt started on Eliquis- is un-insured- TOC pharmacy to fill starter pack with 30day coupon, and other d/c meds. CM has spoken with pt and provided patient assistance application for further Eliquis needs. Pt has f/u appointment for primary are made for 3/14.   No further TOC needs noted, pt to follow AVS instructions for follow up needs.    Final next level of care: Home/Self Care Barriers to Discharge: Barriers Resolved   Patient Goals and CMS Choice CMS Medicare.gov Compare Post Acute Care list provided to:: Patient Choice offered to / list presented to : NA  Discharge Placement                 Home        Discharge Plan and Services Additional resources added to the After Visit Summary for     Discharge Planning Services: Medication Assistance Post Acute Care Choice: NA          DME Arranged: N/A DME Agency: NA       HH Arranged: NA HH Agency: NA        Social Determinants of Health (SDOH) Interventions SDOH Screenings   Food Insecurity: No Food Insecurity (05/10/2022)  Housing: Low Risk  (05/10/2022)  Transportation Needs: No Transportation Needs (05/10/2022)  Utilities: Not At Risk (05/10/2022)  Tobacco Use: Low Risk  (05/13/2022)     Readmission Risk Interventions    05/19/2022   12:57 PM  Readmission Risk Prevention Plan  Transportation Screening Complete  PCP or Specialist Appt within 5-7 Days Complete  Home Care Screening Complete  Medication Review (RN CM) Complete

## 2022-05-19 NOTE — Progress Notes (Signed)
CARDIAC REHAB PHASE I   Pt out in hall for independent walk. Joined pt for rest of walk in hall. Tolerating ambulation well with no pain, dizziness or SOB. Reviewed home education all questions and concerns addressed. Pt is feeling well today and hopeful for discharge this afternoon or tomorrow morning. Will continue to follow.   [id number]   Vanessa Barbara, RN BSN 05/19/2022 11:14 AM

## 2022-05-19 NOTE — Progress Notes (Addendum)
Rounding Note    Patient Name: Glenn Stewart Date of Encounter: 05/19/2022  [place] Cardiologist: Kirk Ruths, MD   Subjective   About to walk  No CP  No SOB   Inpatient Medications    Scheduled Meds:  amiodarone  400 mg Oral BID   amLODipine  5 mg Oral Daily   apixaban  5 mg Oral BID   aspirin EC  81 mg Oral Daily   atorvastatin  80 mg Oral Daily   bisacodyl  10 mg Oral Daily   Or   bisacodyl  10 mg Rectal Daily   Chlorhexidine Gluconate Cloth  6 each Topical Daily   docusate sodium  200 mg Oral Daily   furosemide  40 mg Oral Daily   isosorbide mononitrate  30 mg Oral Daily   metoprolol tartrate  50 mg Oral BID   pantoprazole  40 mg Oral Daily   potassium chloride  40 mEq Oral Daily   psyllium  1 packet Oral Daily   sertraline  200 mg Oral Daily   sodium chloride flush  3 mL Intravenous Q12H   tamsulosin  0.4 mg Oral Daily   Continuous Infusions:  sodium chloride     PRN Meds: sodium chloride, acetaminophen, ALPRAZolam, alum & mag hydroxide-simeth, magnesium hydroxide, metoprolol tartrate, ondansetron (ZOFRAN) IV, mouth rinse, oxyCODONE, sodium chloride flush, traZODone   Vital Signs    Vitals:   05/19/22 0041 05/19/22 0454 05/19/22 0500 05/19/22 0906  BP: (!) 123/90 (!) 130/93  (!) 151/92  Pulse: 81 79  100  Resp: '18 20 20 20  '$ Temp: 97.6 F (36.4 C) 98.4 F (36.9 C)  98.4 F (36.9 C)  TempSrc: Axillary Oral  Oral  SpO2: 93% 92%  96%  Weight:   89.7 kg   Height:       No intake or output data in the 24 hours ending 05/19/22 1132     05/19/2022    5:00 AM 05/18/2022    7:09 AM 05/17/2022    5:00 AM  Last 3 Weights  Weight (lbs) 197 lb 12 oz 197 lb 12 oz 197 lb 5 oz  Weight (kg) 89.7 kg 89.7 kg 89.5 kg      Telemetry    SR    Personally Reviewed  ECG    No new  - Personally Reviewed  Physical Exam   GEN: No acute distress.   Neck: JVP isn normal  Cardiac: RRR, no murmurs, rubs,   Respiratory: Clear to auscultation  bilaterally. GI: Soft, nontender, non-distended  MS: No edema; No deformity. Neuro:  Nonfocal  Psych: Normal affect   Labs    High Sensitivity Troponin:   Recent Labs  Lab 05/10/22 0122 05/10/22 0350 05/10/22 0505  TROPONINIHS 17 19* 17     Chemistry Recent Labs  Lab 05/13/22 0422 05/13/22 1704 05/14/22 0305 05/15/22 0215 05/17/22 0743 05/18/22 0612  NA 135 135   < > 137 135 133*  K 3.9 3.7   < > 4.4 3.9 3.9  CL 105 101   < > 102 98 98  CO2 23 26   < > '27 26 22  '$ GLUCOSE 104* 131*   < > 102* 121* 115*  BUN 10 11   < > 14 16 24*  CREATININE 0.78 0.84   < > 0.83 0.91 1.01  CALCIUM 8.1* 8.5*   < > 8.7* 9.1 9.2  MG 2.5* 2.4  --   --   --  2.1  GFRNONAA >60 >60   < > >60 >60 >60  ANIONGAP 7 8   < > '8 11 13   '$ < > = values in this interval not displayed.    Lipids No results for input(s): "CHOL", "TRIG", "HDL", "LABVLDL", "LDLCALC", "CHOLHDL" in the last 168 hours.  Hematology Recent Labs  Lab 05/14/22 0305 05/15/22 0215 05/19/22 0113  WBC 10.8* 9.6 8.2  RBC 4.44 4.42 4.80  HGB 12.8* 12.3* 13.5  HCT 36.8* 37.5* 39.9  MCV 82.9 84.8 83.1  MCH 28.8 27.8 28.1  MCHC 34.8 32.8 33.8  RDW 12.9 13.1 12.5  PLT 198 198 324   Thyroid No results for input(s): "TSH", "FREET4" in the last 168 hours.  BNPNo results for input(s): "BNP", "PROBNP" in the last 168 hours.  DDimer No results for input(s): "DDIMER" in the last 168 hours.   Radiology    No results found.  Cardiac Studies    05/10/22  LHC    Prox RCA to Mid RCA lesion is 100% stenosed.   1st Mrg lesion is 99% stenosed.   Mid Cx to Dist Cx lesion is 99% stenosed.   Ost LAD to Prox LAD lesion is 70% stenosed.   Mid LAD lesion is 100% stenosed.   1st Diag lesion is 99% stenosed.   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 Severe disease in a moderate caliber Diagonal branch that arises prior to the total LAD occlusion.  Large  caliber Circumflex with diffuse severe distal stenosis leading into the most distal obtuse marginal branch. (Functional CTO). The first obtuse marginal branch is a moderate caliber vessel with severe ostial stenosis.  The RCA is a large dominant artery with chronic total occlusion of the mid vessel The distal vessel fills from right to right collaterals and left to right collaterals.    Recommendations; He has severe three vessel CAD with chronic occlusion of the mid RCA, mid LAD and distal Circumflex with severe disease in the early obtuse marginal branch and in the diagonal branch. Bypass surgery appears to be the best method for revascularization. Will ask CT surgery to see him today to discuss bypass. Will resume IV heparin 2 hours post sheath pull. Continue ASA, statin and beta blocker.   Echo   05/10/22   1. Inferior basal hypokinesis . Left ventricular ejection fraction, by  estimation, is 50 to 55%. The left ventricle has low normal function. The  left ventricle demonstrates regional wall motion abnormalities (see  scoring diagram/findings for  description). Left ventricular diastolic parameters were normal.   2. Right ventricular systolic function is normal. The right ventricular  size is normal.   3. Left atrial size was mildly dilated.   4. The mitral valve is abnormal. Trivial mitral valve regurgitation. No  evidence of mitral stenosis.   5. The aortic valve is tricuspid. There is mild calcification of the  aortic valve. Aortic valve regurgitation is not visualized. Aortic valve  sclerosis is present, with no evidence of aortic valve stenosis.   6. The inferior vena cava is normal in size with greater than 50%  respiratory variability, suggesting right atrial pressure of 3 mmHg.    Patient Profile     57 y.o. male hx of CAD, HTN, HL, anxiety/depression., ADHD admitted for SSCP for cardiac cath   [facility] on to have CABG 05/12/22    Assessment & Plan    1  Rhythm.  REmains in [place]  today     would keep on 400 bid to Friday   Saturday go to 400 daily   Keep on Eliquis   2  NSVT  No recurrence  3  CAD   Severe CAD  Pt is POD 5    s/p CABG (LIMA to LAD; radial to OM, SVG to D1 and Seq graft to AM/PDA)  4  Hx HTN   BP is mildly increased today   Follow for now  Labile    5 HDL   Keep on lipitor 80     Pt has appt   For questions or updates, please contact [place] Please consult [email or web address] for contact info under        Signed, Dorris Carnes, MD  05/19/2022, 11:32 AM

## 2022-05-19 NOTE — Progress Notes (Signed)
Patient's chest tube sutures removed per MD order without difficulty.

## 2022-05-20 ENCOUNTER — Telehealth: Payer: Self-pay

## 2022-05-20 NOTE — Transitions of Care (Post Inpatient/ED Visit) (Signed)
   05/20/2022  Name: Glenn Stewart MRN: [id number] DOB: 1965/11/11  Today's TOC FU Call Status: Today's TOC FU Call Status:: Unsuccessul Call (1st Attempt) Unsuccessful Call (1st Attempt) Date: 05/20/22  Attempted to reach the patient regarding the most recent Inpatient/ED visit.  Follow Up Plan: Additional outreach attempts will be made to reach the patient to complete the Transitions of Care (Post Inpatient/ED visit) call.   [place] LPN [place] Advisor Direct Dial [phone]

## 2022-05-24 NOTE — Transitions of Care (Post Inpatient/ED Visit) (Unsigned)
   05/24/2022  Name: Glenn Stewart MRN: [id number] DOB: 04-06-1965  Today's TOC FU Call Status: Today's TOC FU Call Status:: Unsuccessful Call (2nd Attempt) Unsuccessful Call (1st Attempt) Date: 05/20/22 Unsuccessful Call (2nd Attempt) Date: 05/24/22  Attempted to reach the patient regarding the most recent Inpatient/ED visit.  Follow Up Plan: Additional outreach attempts will be made to reach the patient to complete the Transitions of Care (Post Inpatient/ED visit) call.   [place] LPN [place] Advisor Direct Dial [phone]

## 2022-05-25 NOTE — Transitions of Care (Post Inpatient/ED Visit) (Signed)
   05/25/2022  Name: Glenn Stewart MRN: [id number] DOB: Apr 18, 1965  Today's TOC FU Call Status: Today's TOC FU Call Status:: Unsuccessful Call (3rd Attempt) Unsuccessful Call (1st Attempt) Date: 05/20/22 Unsuccessful Call (2nd Attempt) Date: 05/24/22 Unsuccessful Call (3rd Attempt) Date: 05/25/22  Attempted to reach the patient regarding the most recent Inpatient/ED visit.  Follow Up Plan: No further outreach attempts will be made at this time. We have been unable to contact the patient.  [place] LPN [place] Advisor Direct Dial [phone]

## 2022-05-25 NOTE — Progress Notes (Signed)
Cardiology Office Note:    Date:  06/08/2022   ID:  Glenn Stewart, DOB October 28, 1965, MRN [id number]  PCP:  Abner Greenspan, MD  Cardiologist:  Kirk Ruths, MD  Electrophysiologist:  None   Referring MD: Abner Greenspan, MD   Chief Complaint: hospital follow-up s/p CABG  History of Present Illness:    Glenn Stewart is a 57 y.o. male with a history of CAD s/p recent CABG x5 (LIMA-LAD, left radial-OM1, SVG-D1, sequential SVG-OM-PLA) on 05/12/2022, paroxysmal atrial fibrillation on Amiodarone and Eliquis, mild bilateral carotid stenosis, hypertension, hyperlipidemia, ADHD, and bipolar disorder who is followed by Dr. Stanford Breed and presents today for hospital follow-up after CABG.  Patient has a history of chest pain and has been worked up for this in the past. He had a negative stress Echo in 2010. In 08/2017, coronary calcium score came back elevated at 2,076 which put him in the 99th percentile for age and sex. At that time, coronary angiogram was recommended but patient was concerned about cost. However, ETT aroudn this time was negative. He was seen in the ED in 02/2020 with left sided chest pain. Work-up included EKG, troponin, and D-dimer were unremarkable. He was placed on Imdur and has done well since then. He was last seen by Kerin Ransom, PA-C, in 05/2020 at which time he denied any chest pain or unusual dyspnea.   He was last seen by me in 11/2020 at which time he was doing very well from a cardiac standpoint with no chest pian or shortness of breath.   Since last visit he was admitted from 05/09/2022 to 05/19/2022 after presenting with a syncopal episode and intermittent chest pain. High-sensitivity troponin negative. Echo showed LVEF of 50-55% with inferior basal hypokinesis. LHC showed severe three vessel CAD including CTO of LAD. CT surgery was consulted and patient underwent CABG x5 with LIMA to LAD, left radial to OM1, SVG to D1, and sequential SVG to OM and PLA with Dr. Roxan Hockey on  05/12/2022.  He felt some clicking lateral to seternal wound after being on his side on 05/15/2022 but there was no sternal instability on exam and chest x-ray showed all wires were intact. He did have some post-op atrial fibrillation and was started on Amiodarone and Eliquis.  Patient presents today for follow-up. He is doing well overall since surgery. He reports some chest soreness around incision but no real chest pain. Nothing that sounds like angina. He is able to walk 20 minutes and go up the the stair with no chest pain. No shortness of breath, orthopnea, PND, or edema. He is having trouble breathing due to chest wall soreness and some anxiety. No recurrent palpitations. He notes some occasional lightheadedness/ dizziness when standing quickly but he thinks this is getting better. This sounds like orthostasis. No syncope. He reported a couple of episodes of bright red blood in his stools on the Eliquis and Aspirin but he states he was more constipated at the time. He started taking fiber daily and this has resolved. He denies any bloody stools in the past week.   Patient did have a vasovagal episode in the office. He became lightheadedness, nausea, and clammy towards the end of visit after taking several deep breaths on exam. I rechecked his systolic BP manually and it was in the 80s. I could not auscultate the diastolic BP. Symptoms gradually improved. I had him lay down and rechecked BP with automatic cuff. BP was 104/70 and HR was 52 bpm  with this - symptoms had resolved.  Encouraged him increase PO intake today. He denies any episodes like this at home.  Past Medical History:  Diagnosis Date   ADHD (attention deficit hyperactivity disorder)    Anxiety state, unspecified    Bipolar 1 disorder ([place])    CHEST PAIN 12/04/2008   Qualifier: Diagnosis of  By: Glori Bickers MD, Carmell Austria    Depressive disorder, not elsewhere classified    Dermatophytosis of nail    Esophageal reflux    HYPERGLYCEMIA  07/10/2007   Qualifier: Diagnosis of  By: Glori Bickers MD, Carmell [place]    Kidney stones 09/22/2010   Low back pain 09/18/2013   Other chronic nonalcoholic liver disease    Problems with hearing    Prostatitis, unspecified    Pure hypercholesterolemia    Screen for STD (sexually transmitted disease) 04/20/2011   Sprain of cruciate ligament of knee    Tear of medial cartilage or meniscus of knee, current    TMJ (temporomandibular joint disorder) 12/25/2010   Unspecified essential hypertension    Unspecified gastritis and gastroduodenitis without mention of hemorrhage    Urethritis, unspecified     Past Surgical History:  Procedure Laterality Date   CORONARY ARTERY BYPASS GRAFT N/A 05/12/2022   Procedure: CORONARY ARTERY BYPASS GRAFTING (CABG) X FIVE BYPASSES USING OPEN LEFT INTERNAL MAMMARY ARTERY, OPEN LEFT RADIAL ARTERY, AND ENDOSCOPIC RIGHT GREATER SAPHENOUS VEIN HARVEST.;  Surgeon: [name], MD;  Location: [facility];  Service: Open Heart Surgery;  Laterality: N/A;   LEFT HEART CATH AND CORONARY ANGIOGRAPHY N/A 05/10/2022   Procedure: LEFT HEART CATH AND CORONARY ANGIOGRAPHY;  Surgeon: Burnell Blanks, MD;  Location: [facility];  Service: Cardiovascular;  Laterality: N/A;   No prior surgery     RADIAL ARTERY HARVEST Left 05/12/2022   Procedure: RADIAL ARTERY HARVEST;  Surgeon: [name], MD;  Location: [facility];  Service: Open Heart Surgery;  Laterality: Left;   TEE WITHOUT CARDIOVERSION N/A 05/12/2022   Procedure: TRANSESOPHAGEAL ECHOCARDIOGRAM;  Surgeon: [name], MD;  Location: [facility];  Service: Open Heart Surgery;  Laterality: N/A;    Current Medications: Current Meds  Medication Sig   acetaminophen (TYLENOL) 325 MG tablet Take 2 tablets (650 mg total) by mouth every 4 (four) hours as needed for moderate pain or headache.   ALPRAZolam (XANAX) 1 MG tablet Take 1 mg by mouth Once daily as needed for anxiety.   amLODipine (NORVASC) 5 MG tablet Take 1  tablet (5 mg total) by mouth daily.   apixaban (ELIQUIS) 5 MG TABS tablet Take 1 tablet (5 mg total) by mouth 2 (two) times daily.   aspirin EC 81 MG tablet Take 1 tablet (81 mg total) by mouth daily. Swallow whole.   atorvastatin (LIPITOR) 80 MG tablet Take 1 tablet (80 mg total) by mouth daily.   fish oil-omega-3 fatty acids 1000 MG capsule Take 2 g by mouth daily.   isosorbide mononitrate (IMDUR) 30 MG 24 hr tablet Take 1 tablet (30 mg total) by mouth daily.   Multiple Vitamin (MULTIVITAMIN) capsule Take 1 capsule by mouth daily.   oxyCODONE (OXY IR/ROXICODONE) 5 MG immediate release tablet Take 1 tablet (5 mg total) by mouth every 6 (six) hours as needed for severe pain.   sertraline (ZOLOFT) 100 MG tablet Take 200 mg by mouth daily.   tamsulosin (FLOMAX) 0.4 MG CAPS capsule Take 0.4 mg by mouth daily.   traZODone (DESYREL) 50 MG tablet Take 100 mg  by mouth at bedtime as needed for sleep.   [DISCONTINUED] amiodarone (PACERONE) 200 MG tablet Take 2 tablets (400mg ) by mouth two times daily for 2 days; then take 1 tablet 200 mg by mouth two times daily for one week; then take 1 tablet (200mg ) by mouth daily thereafter   [DISCONTINUED] metoprolol tartrate (LOPRESSOR) 50 MG tablet Take 1 tablet (50 mg total) by mouth 2 (two) times daily.     Allergies:   Sulfonamide derivatives   Social History   Socioeconomic History   Marital status: Single    Spouse name: Not on file   Number of children: 0   Years of education: Not on file   Highest education level: Not on file  Occupational History   Occupation: Owns motorcycle shop  Tobacco Use   Smoking status: Never   Smokeless tobacco: Never  Vaping Use   Vaping Use: Never used  Substance and Sexual Activity   Alcohol use: Yes    Comment: rare   Drug use: No   Sexual activity: Not on file  Other Topics Concern   Not on file  Social History Narrative   Not on file   Social Determinants of Health   Financial Resource Strain: Not on  file  Food Insecurity: No Food Insecurity (05/10/2022)   Hunger Vital Sign    Worried About Running Out of Food in the Last Year: Never true    Ran Out of Food in the Last Year: Never true  Transportation Needs: No Transportation Needs (05/10/2022)   PRAPARE - Hydrologist (Medical): No    Lack of Transportation (Non-Medical): No  Physical Activity: Not on file  Stress: Not on file  Social Connections: Not on file     Family History: The patient's family history includes Diabetes in his father; Heart failure in his father; Hypertension in his father.  ROS:   Please see the history of present illness.     EKGs/Labs/Other Studies Reviewed:    The following studies were reviewed:  Exercise Tolerance Test 08/30/2017: The patient walked for 7 minutes and 57 seconds of a standard Bruce protocol treadmill test. He achieved a peak heart rate of 144 which is 86% predicted maximal heart rate. At peak exercise there were no ST or T wave changes. Blood pressure response to exercise was normal. This is interpreted as a negative stress test. _______________   CT Cardiac Score 09/01/2017: Coronary calcium score of 2076. This was 42 percentile for age and sex matched control. _______________  Echocardiogram 05/10/2022: Impressions: 1. Inferior basal hypokinesis . Left ventricular ejection fraction, by  estimation, is 50 to 55%. The left ventricle has low normal function. The  left ventricle demonstrates regional wall motion abnormalities (see  scoring diagram/findings for  description). Left ventricular diastolic parameters were normal.   2. Right ventricular systolic function is normal. The right ventricular  size is normal.   3. Left atrial size was mildly dilated.   4. The mitral valve is abnormal. Trivial mitral valve regurgitation. No  evidence of mitral stenosis.   5. The aortic valve is tricuspid. There is mild calcification of the  aortic valve. Aortic  valve regurgitation is not visualized. Aortic valve  sclerosis is present, with no evidence of aortic valve stenosis.   6. The inferior vena cava is normal in size with greater than 50%  respiratory variability, suggesting right atrial pressure of 3 mmHg.  _______________  Carotid Ultrasound 05/10/2022: Impressions: - Right Carotid: Velocities  in the right ICA are consistent with a 1-39% stenosis.  - Left Carotid: Velocities in the left ICA are consistent with a 1-39% stenosis.  - Vertebrals: Bilateral vertebral arteries demonstrate antegrade flow.  - Subclavians: Normal flow hemodynamics were seen in bilateral subclavian arteries.  _______________  Cardiac Catheterization 05/10/2022:   Prox RCA to Mid RCA lesion is 100% stenosed.   1st Mrg lesion is 99% stenosed.   Mid Cx to Dist Cx lesion is 99% stenosed.   Ost LAD to Prox LAD lesion is 70% stenosed.   Mid LAD lesion is 100% stenosed.   1st Diag lesion is 99% stenosed.   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 Severe disease in a moderate caliber Diagonal branch that arises prior to the total LAD occlusion.  Large caliber Circumflex with diffuse severe distal stenosis leading into the most distal obtuse marginal branch. (Functional CTO). The first obtuse marginal branch is a moderate caliber vessel with severe ostial stenosis.  The RCA is a large dominant artery with chronic total occlusion of the mid vessel The distal vessel fills from right to right collaterals and left to right collaterals.    Recommendations; He has severe three vessel CAD with chronic occlusion of the mid RCA, mid LAD and distal Circumflex with severe disease in the early obtuse marginal branch and in the diagonal branch. Bypass surgery appears to be the best method for revascularization. Will ask CT surgery to see him today to discuss bypass. Will resume IV heparin 2 hours post  sheath pull. Continue ASA, statin and beta blocker.   Diagnostic Dominance: Right      EKG:  EKG ordered today. EKG personally reviewed and demonstrates sinus bradycardia, rate 55 bpm, with low voltage QRS and non-specific T wave changes. Normal axis. Normal PR and QRS intervals. QTc 403 ms.   Recent Labs: 05/11/2022: ALT 26 05/18/2022: BUN 24; Creatinine, Ser 1.01; Magnesium 2.1; Potassium 3.9; Sodium 133 05/19/2022: Hemoglobin 13.5; Platelets 324  Recent Lipid Panel    Component Value Date/Time   CHOL 182 05/10/2022 0505   TRIG 180 (H) 05/10/2022 0505   HDL 38 (L) 05/10/2022 0505   CHOLHDL 4.8 05/10/2022 0505   VLDL 36 05/10/2022 0505   LDLCALC 108 (H) 05/10/2022 0505   LDLDIRECT 117.5 04/10/2012 1012    Physical Exam:    Vital Signs: BP 98/66   Pulse (!) 52   Ht 5\' 6"  (1.676 m)   Wt 201 lb 12.8 oz (91.5 kg)   SpO2 94%   BMI 32.57 kg/m     Wt Readings from Last 3 Encounters:  06/08/22 201 lb 12.8 oz (91.5 kg)  05/19/22 197 lb 12 oz (89.7 kg)  11/28/20 216 lb 9.6 oz (98.2 kg)     General: 57 y.o. Caucasian male in no acute distress. HEENT: Normocephalic and atraumatic. Sclera clear. EOMs intact. Neck: Supple. No JVD. Heart:  RRR. Distinct S1 and S2. No murmurs, gallops, or rubs.  Lungs: No increased work of breathing. Clear to ausculation bilaterally. No wheezes, rhonchi, or rales.  Abdomen: Soft, non-distended, and non-tender to palpation.  MSK: Normal strength and tone for age.  Extremities: No lower extremity edema.  Skin: Warm and dry. Sternotomy incision and incision from left radial graft healing well.  Neuro: Alert and oriented x3. No focal deficits. Psych: Normal affect. Responds appropriately.  Assessment:    1. Coronary artery disease involving native coronary  artery of native heart without angina pectoris   2. S/P CABG x 5   3. Paroxysmal atrial fibrillation (HCC)   4. Primary hypertension   5. Hyperlipidemia, unspecified hyperlipidemia type   6.  Medication management     Plan:    CAD s/p CABG x5 Coronary calcium score was significantly elevated in 08/2017 at 2,076 (99th percentile for age/sex) in 08/2017. However, ETT was negative. Patient was recently admitted in 04/2022 for syncope and intermittent chest pain and found to have 3 vessel CAD on LHC and ultimately underwent CABG x5.  - No angina.  - Continue Amlodipine 5mg  daily and Imdur 30mg  daily.  - Will decrease Lopressor to 25mg  twice daily given bradycardia and vasovagal event below. - Continue aspirin and statin.  - Will check BMET and CBC today as he has not had labs checked since discharge.  Paroxysmal Atrial Fibrillation Noted to have atrial fibrillation on and off during recent admission in 04/2022 following CABG. He was ultimately started on Amiodarone and Eliquis.  - EKG today shows sinus bradycardia with rates in the 50s. - Continue Amiodarone 200mg  daily. Provided refill of this today. - Will decrease Lopressor to 25mg  twice daily given vasovagal event as described below. - Continue Eliquis 5mg  twice daily. - Given patient's age, he is not a good long-term candidate for Amiodarone. Can hopefully stop this after a couple of months if no recurrent atrial fibrillation. Will order 30 day event monitor to  assess for any recurrent atrial fibrillation.   Of note, Eliquis is going to cost him $600. Provided him patient assistance forms and samples in the office today.    Hypertension BP initially well controlled at 130/80 but then has vasovagal episode as described below. - Continue Amlodipine 5mg  daily and Imdur 30mg  daily.  - Will decrease Lopressor to 25mg  twice daily   Of note, patient had a vasovagal episode in the office after taking several quick breaths on exam.  He became lightheadedness, nausea, and clammy. He dry heaved several times but no emesis.  I rechecked his systolic BP manually during this time and it was in the 80s. I could not auscultate the diastolic BP.  Symptoms gradually improved. I had him lay down and rechecked BP with automatic cuff. BP was 104/70 and HR was 52 bpm supine. Symptoms completely resolved prior to him leaving the office. His last BP prior to leaving the office was 98/66 supine. He has not had anything to eat today. Encouraged him increase PO intake today. Also advised him to check his BP tomorrow before taking any of his antihypertensive medications and let me know if systolic BP is [id number].    Hyperlipidemia Lipid panel in 04/2022 during recent admission: Total Cholesterol 182, Triglycerides 180, HDL 38, LDL 108. LDL goal <70 given CAD. - He was switched from Simvastatin to Lipitor 80mg  daily during recent admission. Continue. - Will repeat lipid panel and LFTs in 6-8 weeks.   Disposition: Follow up in 3 months.    Medication Adjustments/Labs and Tests Ordered: Current medicines are reviewed at length with the patient today.  Concerns regarding medicines are outlined above.  Orders Placed This Encounter  Procedures   Basic metabolic panel   CBC   Lipid panel   Hepatic function panel   CARDIAC EVENT MONITOR   EKG 12-Lead   Meds ordered this encounter  Medications   amiodarone (PACERONE) 200 MG tablet    Sig: Take 1 tablet (200 mg total) by mouth daily.  Dispense:  90 tablet    Refill:  3   metoprolol tartrate (LOPRESSOR) 25 MG tablet    Sig: Take 1 tablet (25 mg total) by mouth 2 (two) times daily.    Dispense:  180 tablet    Refill:  3    Dose change new Rx    Patient Instructions  Medication Instructions:   DECREASE Metoprolol Tartrate (Lopressor) to 25 mg time a day  *If you need a refill on your cardiac medications before your next appointment, please call your pharmacy*  Lab Work: Your physician recommends that you return for lab work TODAY:  BMP CBC  Your physician recommends that you return for lab work in 6-8 weeks (07/20/22-08/04/22):  Fasting Lipid Panel-DO NOT eat or drink past midnight. Okay  to have water and/or black coffee only the morning of blood work Hepatic (Liver) Function Test  If you have labs (blood work) drawn today and your tests are completely normal, you will receive your results only by: MyChart Message (if you have MyChart) OR A paper copy in the mail If you have any lab test that is abnormal or we need to change your treatment, we will call you to review the results.  Testing/Procedures:  [employer] Cardiac Event Monitor Instructions  Your physician has requested you wear your cardiac event monitor for 30 days. [employer] may call or text to confirm a shipping address. The monitor will be sent to a land address via UPS. [employer] will not ship a monitor to a PO BOX. It typically takes 3-5 days to receive your monitor after it has been enrolled. [employer] will assist with USPS tracking if your package is delayed. The telephone number for [employer] is [phone]. Once you have received your monitor, please review the enclosed instructions. Instruction tutorials can also be viewed under help and settings on the enclosed cell phone. Your monitor has already been registered assigning a specific monitor serial # to you.  Billing and Self Pay Discount Information  [employer] has been provided the insurance information we had on file for you.  If your insurance has been updated, please call [employer] at [phone] to provide them with your updated insurance information.   [employer] offers a discounted Self Pay option for patients who have insurance that does not cover their cardiac event monitor or patients without insurance.  The discounted cost of a Self Pay Cardiac Event Monitor would be $225.00 , if the patient contacts [employer] at [phone] within 7 days of applying the monitor to make payment arrangements.  If the patient does not contact [employer] within 7 days of applying the monitor, the cost of the cardiac event monitor will be  $350.00.  Applying the monitor  Remove cell phone from case and turn it on. The cell phone works as Dealer and needs to be within [employer] of you at all times. The cell phone will need to be charged on a daily basis. We recommend you plug the cell phone into the enclosed charger at your bedside table every night.  Monitor batteries: You will receive two monitor batteries labelled #1 and #2. These are your recorders. Plug battery #2 onto the second connection on the enclosed charger. Keep one battery on the charger at all times. This will keep the monitor battery deactivated. It will also keep it fully charged for when you need to switch your monitor batteries. A small light will be blinking on the battery emblem when it is charging. The light on the  battery emblem will remain on when the battery is fully charged.  Open package of a Monitor strip. Insert battery #1 into black hood on strip and gently squeeze monitor battery onto connection as indicated in instruction booklet. Set aside while preparing skin.  Choose location for your strip, vertical or horizontal, as indicated in the instruction booklet. Shave to remove all hair from location. There cannot be any lotions, oils, powders, or colognes on skin where monitor is to be applied. Wipe skin clean with enclosed Saline wipe. Dry skin completely.  Peel paper labeled #1 off the back of the Monitor strip exposing the adhesive. Place the monitor on the chest in the vertical or horizontal position shown in the instruction booklet. One arrow on the monitor strip must be pointing upward. Carefully remove paper labeled #2, attaching remainder of strip to your skin. Try not to create any folds or wrinkles in the strip as you apply it.  Firmly press and release the circle in the center of the monitor battery. You will hear a small beep. This is turning the monitor battery on. The heart emblem on the monitor battery will light up every 5  seconds if the monitor battery in turned on and connected to the patient securely. Do not push and hold the circle down as this turns the monitor battery off. The cell phone will locate the monitor battery. A screen will appear on the cell phone checking the connection of your monitor strip. This may read poor connection initially but change to good connection within the next minute. Once your monitor accepts the connection you will hear a series of 3 beeps followed by a climbing crescendo of beeps. A screen will appear on the cell phone showing the two monitor strip placement options. Touch the picture that demonstrates where you applied the monitor strip.  Your monitor strip and battery are waterproof. You are able to shower, bathe, or swim with the monitor on. They just ask you do not submerge deeper than 3 feet underwater. We recommend removing the monitor if you are swimming in a lake, river, or ocean.  Your monitor battery will need to be switched to a fully charged monitor battery approximately once a week. The cell phone will alert you of an action which needs to be made.  On the cell phone, tap for details to reveal connection status, monitor battery status, and cell phone battery status. The green dots indicates your monitor is in good status. A red dot indicates there is something that needs your attention.  To record a symptom, click the circle on the monitor battery. In 30-60 seconds a list of symptoms will appear on the cell phone. Select your symptom and tap save. Your monitor will record a sustained or significant arrhythmia regardless of you clicking the button. Some patients do not feel the heart rhythm irregularities. [employer] will notify us of any serious or critical events.  Refer to instruction booklet for instructions on switching batteries, changing strips, the Do not disturb or Pause features, or any additional questions.  Call [employer] at [phone], to  confirm your monitor is transmitting and record your baseline. They will answer any questions you may have regarding the monitor instructions at that time.  Returning the monitor to [place] all equipment back into blue box. Peel off strip of paper to expose adhesive and close box securely. There is a prepaid UPS shipping label on this box. Drop in a UPS drop box, or at a  UPS facility like [employer]. You may also contact [employer] to arrange UPS to pick up monitor package at your home.   Follow-Up: At [facility], you and your health needs are our priority.  As part of our continuing mission to provide you with exceptional heart care, we have created designated Provider Care Teams.  These Care Teams include your primary Cardiologist (physician) and Advanced Practice Providers (APPs -  Physician Assistants and Nurse Practitioners) who all work together to provide you with the care you need, when you need it.   Your next appointment:   3 month(s)  Provider:   Kirk Ruths, MD  or Sande Rives, PA-C        Other Instructions     Signed, Darreld Mclean, PA-C  06/08/2022 1:05 PM    [facility]

## 2022-05-26 ENCOUNTER — Ambulatory Visit: Payer: Self-pay | Admitting: Student

## 2022-05-28 MED FILL — Sodium Chloride IV Soln 0.9%: INTRAVENOUS | Qty: 1000 | Status: AC

## 2022-05-28 MED FILL — Electrolyte-R (PH 7.4) Solution: INTRAVENOUS | Qty: 4000 | Status: AC

## 2022-05-28 MED FILL — Lidocaine HCl Local Soln Prefilled Syringe 100 MG/5ML (2%): INTRAMUSCULAR | Qty: 5 | Status: AC

## 2022-05-28 MED FILL — Sodium Chloride Irrigation Soln 0.9%: Qty: 3000 | Status: AC

## 2022-05-28 MED FILL — Sodium Bicarbonate IV Soln 8.4%: INTRAVENOUS | Qty: 50 | Status: AC

## 2022-05-28 MED FILL — Mannitol IV Soln 20%: INTRAVENOUS | Qty: 500 | Status: AC

## 2022-05-28 MED FILL — Heparin Sodium (Porcine) Inj 1000 Unit/ML: INTRAMUSCULAR | Qty: 20 | Status: AC

## 2022-06-03 ENCOUNTER — Inpatient Hospital Stay (INDEPENDENT_AMBULATORY_CARE_PROVIDER_SITE_OTHER): Payer: Self-pay | Admitting: Primary Care

## 2022-06-08 ENCOUNTER — Ambulatory Visit: Payer: Medicaid Other | Attending: Student | Admitting: Student

## 2022-06-08 ENCOUNTER — Encounter: Payer: Self-pay | Admitting: Student

## 2022-06-08 VITALS — BP 98/66 | HR 52 | Ht 66.0 in | Wt 201.8 lb

## 2022-06-08 DIAGNOSIS — E785 Hyperlipidemia, unspecified: Secondary | ICD-10-CM | POA: Diagnosis not present

## 2022-06-08 DIAGNOSIS — I2510 Atherosclerotic heart disease of native coronary artery without angina pectoris: Secondary | ICD-10-CM | POA: Diagnosis not present

## 2022-06-08 DIAGNOSIS — I480 Paroxysmal atrial fibrillation: Secondary | ICD-10-CM

## 2022-06-08 DIAGNOSIS — Z79899 Other long term (current) drug therapy: Secondary | ICD-10-CM | POA: Diagnosis not present

## 2022-06-08 DIAGNOSIS — Z951 Presence of aortocoronary bypass graft: Secondary | ICD-10-CM | POA: Diagnosis not present

## 2022-06-08 DIAGNOSIS — I10 Essential (primary) hypertension: Secondary | ICD-10-CM | POA: Diagnosis not present

## 2022-06-08 MED ORDER — METOPROLOL TARTRATE 25 MG PO TABS: 25.0000 mg | ORAL_TABLET | Freq: Two times a day (BID) | ORAL | 3 refills | Status: DC

## 2022-06-08 MED ORDER — AMIODARONE HCL 200 MG PO TABS: 200.0000 mg | ORAL_TABLET | Freq: Every day | ORAL | 3 refills | Status: DC

## 2022-06-08 NOTE — Patient Instructions (Addendum)
Medication Instructions:   DECREASE Metoprolol Tartrate (Lopressor) to 25 mg time a day  *If you need a refill on your cardiac medications before your next appointment, please call your pharmacy*  Lab Work: Your physician recommends that you return for lab work TODAY:  [place] recommends that you return for lab work in 6-8 weeks (07/20/22-08/04/22):  Fasting Lipid Panel-DO NOT eat or drink past midnight. Okay to have water and/or black coffee only the morning of blood work Hepatic (Liver) Function Test  If you have labs (blood work) drawn today and your tests are completely normal, you will receive your results only by: MyChart Message (if you have MyChart) OR A paper copy in the mail If you have any lab test that is abnormal or we need to change your treatment, we will call you to review the results.  Testing/Procedures:  [employer] Cardiac Event Monitor Instructions  Your physician has requested you wear your cardiac event monitor for 30 days. [employer] may call or text to confirm a shipping address. The monitor will be sent to a land address via UPS. [employer] will not ship a monitor to a PO BOX. It typically takes 3-5 days to receive your monitor after it has been enrolled. [employer] will assist with USPS tracking if your package is delayed. The telephone number for [employer] is [phone]. Once you have received your monitor, please review the enclosed instructions. Instruction tutorials can also be viewed under help and settings on the enclosed cell phone. Your monitor has already been registered assigning a specific monitor serial # to you.  Billing and Self Pay Discount Information  [employer] has been provided the insurance information we had on file for you.  If your insurance has been updated, please call [employer] at [phone] to provide them with your updated insurance information.   [employer] offers a discounted Self Pay option for  patients who have insurance that does not cover their cardiac event monitor or patients without insurance.  The discounted cost of a Self Pay Cardiac Event Monitor would be $225.00 , if the patient contacts [employer] at [phone] within 7 days of applying the monitor to make payment arrangements.  If the patient does not contact [employer] within 7 days of applying the monitor, the cost of the cardiac event monitor will be $350.00.  Applying the monitor  Remove cell phone from case and turn it on. The cell phone works as Dealer and needs to be within [employer] of you at all times. The cell phone will need to be charged on a daily basis. We recommend you plug the cell phone into the enclosed charger at your bedside table every night.  Monitor batteries: You will receive two monitor batteries labelled #1 and #2. These are your recorders. Plug battery #2 onto the second connection on the enclosed charger. Keep one battery on the charger at all times. This will keep the monitor battery deactivated. It will also keep it fully charged for when you need to switch your monitor batteries. A small light will be blinking on the battery emblem when it is charging. The light on the battery emblem will remain on when the battery is fully charged.  Open package of a Monitor strip. Insert battery #1 into black hood on strip and gently squeeze monitor battery onto connection as indicated in instruction booklet. Set aside while preparing skin.  Choose location for your strip, vertical or horizontal, as indicated in the instruction booklet. Shave to remove  all hair from location. There cannot be any lotions, oils, powders, or colognes on skin where monitor is to be applied. Wipe skin clean with enclosed Saline wipe. Dry skin completely.  Peel paper labeled #1 off the back of the Monitor strip exposing the adhesive. Place the monitor on the chest in the vertical or horizontal position shown in the  instruction booklet. One arrow on the monitor strip must be pointing upward. Carefully remove paper labeled #2, attaching remainder of strip to your skin. Try not to create any folds or wrinkles in the strip as you apply it.  Firmly press and release the circle in the center of the monitor battery. You will hear a small beep. This is turning the monitor battery on. The heart emblem on the monitor battery will light up every 5 seconds if the monitor battery in turned on and connected to the patient securely. Do not push and hold the circle down as this turns the monitor battery off. The cell phone will locate the monitor battery. A screen will appear on the cell phone checking the connection of your monitor strip. This may read poor connection initially but change to good connection within the next minute. Once your monitor accepts the connection you will hear a series of 3 beeps followed by a climbing crescendo of beeps. A screen will appear on the cell phone showing the two monitor strip placement options. Touch the picture that demonstrates where you applied the monitor strip.  Your monitor strip and battery are waterproof. You are able to shower, bathe, or swim with the monitor on. They just ask you do not submerge deeper than 3 feet underwater. We recommend removing the monitor if you are swimming in a lake, river, or ocean.  Your monitor battery will need to be switched to a fully charged monitor battery approximately once a week. The cell phone will alert you of an action which needs to be made.  On the cell phone, tap for details to reveal connection status, monitor battery status, and cell phone battery status. The green dots indicates your monitor is in good status. A red dot indicates there is something that needs your attention.  To record a symptom, click the circle on the monitor battery. In 30-60 seconds a list of symptoms will appear on the cell phone. Select your symptom and  tap save. Your monitor will record a sustained or significant arrhythmia regardless of you clicking the button. Some patients do not feel the heart rhythm irregularities. [employer] will notify us of any serious or critical events.  Refer to instruction booklet for instructions on switching batteries, changing strips, the Do not disturb or Pause features, or any additional questions.  Call [employer] at [phone], to confirm your monitor is transmitting and record your baseline. They will answer any questions you may have regarding the monitor instructions at that time.  Returning the monitor to [place] all equipment back into blue box. Peel off strip of paper to expose adhesive and close box securely. There is a prepaid UPS shipping label on this box. Drop in a UPS drop box, or at a UPS facility like [employer]. You may also contact [employer] to arrange UPS to pick up monitor package at your home.   Follow-Up: At [facility], you and your health needs are our priority.  As part of our continuing mission to provide you with exceptional heart care, we have created designated Provider Care Teams.  These Care Teams include  your primary Cardiologist (physician) and Advanced Practice Providers (APPs -  Physician Assistants and Nurse Practitioners) who all work together to provide you with the care you need, when you need it.   Your next appointment:   3 month(s)  Provider:   Kirk Ruths, MD  or Sande Rives, PA-C        Other Instructions

## 2022-06-09 LAB — BASIC METABOLIC PANEL
BUN/Creatinine Ratio: 15 (ref 9–20)
BUN: 16 mg/dL (ref 6–24)
CO2: 22 mmol/L (ref 20–29)
Calcium: 9.6 mg/dL (ref 8.7–10.2)
Chloride: 102 mmol/L (ref 96–106)
Creatinine, Ser: 1.05 mg/dL (ref 0.76–1.27)
Glucose: 119 mg/dL — ABNORMAL HIGH (ref 70–99)
Potassium: 4.6 mmol/L (ref 3.5–5.2)
Sodium: 141 mmol/L (ref 134–144)
eGFR: 83 mL/min/{1.73_m2} (ref >59)

## 2022-06-09 LAB — CBC
Hematocrit: 43 % (ref 37.5–51.0)
Hemoglobin: 14.1 g/dL (ref 13.0–17.7)
MCH: 27.4 pg (ref 26.6–33.0)
MCHC: 32.8 g/dL (ref 31.5–35.7)
MCV: 84 fL (ref 79–97)
Platelets: 250 10*3/uL (ref 150–450)
RBC: 5.14 x10E6/uL (ref 4.14–5.80)
RDW: 13.2 % (ref 11.6–15.4)
WBC: 6.9 10*3/uL (ref 3.4–10.8)

## 2022-06-14 ENCOUNTER — Telehealth: Payer: Self-pay | Admitting: *Deleted

## 2022-06-14 ENCOUNTER — Encounter: Payer: Self-pay | Admitting: Physician Assistant

## 2022-06-14 ENCOUNTER — Encounter: Payer: Self-pay | Admitting: Thoracic Surgery (Cardiothoracic Vascular Surgery)

## 2022-06-14 ENCOUNTER — Ambulatory Visit (INDEPENDENT_AMBULATORY_CARE_PROVIDER_SITE_OTHER): Payer: Self-pay | Admitting: Physician Assistant

## 2022-06-14 ENCOUNTER — Encounter: Payer: Self-pay | Admitting: Cardiology

## 2022-06-14 VITALS — BP 107/70 | HR 69 | Resp 20 | Ht 66.0 in | Wt 202.0 lb

## 2022-06-14 DIAGNOSIS — Z951 Presence of aortocoronary bypass graft: Secondary | ICD-10-CM

## 2022-06-14 DIAGNOSIS — T8149XA Infection following a procedure, other surgical site, initial encounter: Secondary | ICD-10-CM

## 2022-06-14 MED ORDER — CEFDINIR 300 MG PO CAPS: 300.0000 mg | ORAL_CAPSULE | Freq: Two times a day (BID) | ORAL | 1 refills | Status: DC

## 2022-06-14 NOTE — Progress Notes (Signed)
[address]       [place] [address]             [phone]       HPI: [name] is a 57 year old male with a past medical history notable for hypertension, dyslipidemia, anxiety and depression, and NASH.  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  He was referred for coronary bypass grafting.  He had CABG x 5 by Dr. Roxan Hockey on 05/12/2022 with a left internal mammary artery grafted to the left anterior descending coronary artery, saphenous vein was grafted to the diagonal, sequential graft to the PDA and PL, and left radial artery graft to the obtuse marginal coronary artery.  He had a brief episode of atrial fibrillation with RVR after surgery managed with amiodarone with prompt conversion back to sinus rhythm.  He otherwise had an uneventful postoperative course.  He was discharged home on postop day 7.  He was seen for scheduled follow-up with the cardiology team on 06/08/2022 and at that time was doing well.  He reported some soreness around his chest incision.  He was walking up to 20 minutes at a time and was walking up stairs with no chest pain or shortness of breath.  [name] called our office earlier today reporting swelling and tenderness in his left arm radial harvest incision.  He was asked to come in for evaluation.  He denies any fever or drainage.  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  Otherwise, he feels well.  He is having minimal chest soreness that is managed with Tylenol alone.  He has had no angina or shortness of breath.   Current Outpatient Medications  Medication Sig Dispense Refill   acetaminophen (TYLENOL) 325 MG tablet Take 2 tablets (650 mg total) by mouth every 4 (four) hours as needed for moderate pain or  headache.     ALPRAZolam (XANAX) 1 MG tablet Take 1 mg by mouth Once daily as needed for anxiety.     amiodarone (PACERONE) 200 MG tablet Take 1 tablet (200 mg total) by mouth daily. 90 tablet 3   amLODipine (NORVASC) 5 MG tablet Take 1 tablet (5 mg total) by mouth daily. 30 tablet 1   apixaban (ELIQUIS) 5 MG TABS tablet Take 1 tablet (5 mg total) by mouth 2 (two) times daily. 60 tablet 1   aspirin EC 81 MG tablet Take 1 tablet (81 mg total) by mouth daily. Swallow whole. 30 tablet 12   atorvastatin (LIPITOR) 80 MG tablet Take 1 tablet (80 mg total) by mouth daily. 30 tablet 1   fish oil-omega-3 fatty acids 1000 MG capsule Take 2 g by mouth daily.     isosorbide mononitrate (IMDUR) 30 MG 24 hr tablet Take 1 tablet (30 mg total) by mouth daily. 30 tablet 1   metoprolol tartrate (LOPRESSOR) 25 MG tablet Take 1 tablet (25 mg total) by mouth 2 (two) times daily. 180 tablet 3   Multiple Vitamin (MULTIVITAMIN) capsule Take 1 capsule by mouth daily.     oxyCODONE (OXY IR/ROXICODONE) 5 MG immediate release tablet Take 1 tablet (5 mg total) by mouth every 6 (six) hours as needed for severe pain. 28 tablet 0   sertraline (ZOLOFT) 100 MG tablet Take  200 mg by mouth daily.     tamsulosin (FLOMAX) 0.4 MG CAPS capsule Take 0.4 mg by mouth daily.     traZODone (DESYREL) 50 MG tablet Take 100 mg by mouth at bedtime as needed for sleep.     No current facility-administered medications for this visit.    Physical Exam: Vital signs BP 107/70 Pulse 69 Respirations 20 SpO2 93% on room air  General: [name] is in good spirits and appears to feel well. Heart: Regular rate and rhythm. Chest: Breath sounds are full, equal, and clear to auscultation.  Sternotomy incision is well-healed.  Sternum is stable. Extremities: There is a 2 cm x 2 cm raised area near the midpoint of the left radial artery harvest incision.  This is mildly erythematous and tender.  It is indurated but is not fluctuant.  No sign of any  drainage at any point along the incision.  Left hand is warm and well-perfused.  The right lower extremity EVH incision is well-healed and he has no peripheral edema.  media Information  Document Information  Patient Upload: Patient Entered Attachment  [id number]  06/14/2022  Attached To:  Patient Message on 06/14/22 with [name], MD  Source Information  Mychart, Generic   Diagnostic Tests: None  Impression / Plan: Overall, [name] appears to be making a progressive and satisfactory recovery since his surgery.  He has had no evidence of recurrent atrial fibrillation. He has recent onset induration this area is tender to light palpation.  There is no fluctuance to suggest a fluid collection that needs to be drained.  I think he should be treated with antibiotics at least a week until his induration resolves.  Will send a prescription for cefdinir 300 mg p.o. twice daily to his pharmacy.  He already has scheduled follow-up with Dr. Roxan Hockey next week.   Antony Odea, PA-C [facility] [phone]

## 2022-06-14 NOTE — Telephone Encounter (Signed)
Patient contacted the answering service over the weekend c/o a painful knot in his radial artery incision site. Per patient, MD on call advised patient to contact the office this morning. Patient stated he believes knot is slightly better than it was yesterday. Asked patient to send photos in via [facility]. Images reviewed by on call PA who advised patient be seen. Wound check appt scheduled for patient today. Patient acknowledges receipt.

## 2022-06-14 NOTE — Patient Instructions (Signed)
Keep the left arm wound clean and dry.  Avoid repetitive and strenuous activities with your left arm  Prescription is sent to your pharmacy for cefdinir 300 mg by mouth twice daily for 7 days.  We will let you know if you need to get the prescription refilled at your next visit  Follow-up with Dr. Roxan Hockey at your regularly scheduled appointment next week

## 2022-06-21 ENCOUNTER — Other Ambulatory Visit: Payer: Self-pay | Admitting: Thoracic Surgery (Cardiothoracic Vascular Surgery)

## 2022-06-21 DIAGNOSIS — I82629 Acute embolism and thrombosis of deep veins of unspecified upper extremity: Secondary | ICD-10-CM

## 2022-06-21 DIAGNOSIS — Z951 Presence of aortocoronary bypass graft: Secondary | ICD-10-CM

## 2022-06-21 DIAGNOSIS — Z419 Encounter for procedure for purposes other than remedying health state, unspecified: Secondary | ICD-10-CM | POA: Diagnosis not present

## 2022-06-22 ENCOUNTER — Encounter: Payer: Self-pay | Admitting: Thoracic Surgery (Cardiothoracic Vascular Surgery)

## 2022-06-22 ENCOUNTER — Ambulatory Visit
Admission: RE | Admit: 2022-06-22 | Discharge: 2022-06-22 | Disposition: A | Discharge status: Home / Self Care | Payer: Self-pay | Source: Ambulatory Visit | Attending: Thoracic Surgery (Cardiothoracic Vascular Surgery) | Admitting: Thoracic Surgery (Cardiothoracic Vascular Surgery)

## 2022-06-22 ENCOUNTER — Ambulatory Visit (INDEPENDENT_AMBULATORY_CARE_PROVIDER_SITE_OTHER): Payer: Self-pay | Admitting: Thoracic Surgery (Cardiothoracic Vascular Surgery)

## 2022-06-22 VITALS — BP 125/75 | HR 61 | Resp 20 | Ht 66.0 in | Wt 207.0 lb

## 2022-06-22 DIAGNOSIS — Z951 Presence of aortocoronary bypass graft: Secondary | ICD-10-CM

## 2022-06-22 NOTE — Progress Notes (Signed)
[address]       [place] [address]             [phone]     HPI: Glenn Stewart returns for a scheduled follow up visit after recent CABG.  Glenn Stewart is a 57 year old man with a history of hypertension, hyperlipidemia, bipolar disorder, anxiety, hearing loss, reflux, and nonalcoholic fatty liver who presented in February after noting left facial numbness and droop.  He recently had a syncopal spell.  MRI of the brain showed no stroke.  He ruled out for MI.  He had known coronary calcifications from her prior CT.  He underwent left heart catheterization which showed severe three-vessel coronary disease.  He underwent coronary bypass grafting x 5 on 05/12/2022.  He had atrial fibrillation postoperatively but converted to sinus rhythm with amiodarone.  He had some recurrent atrial fibrillation ultimately was started on apixaban.  He went home on day 7.  He noticed some redness and swelling on his left arm incision last week.  He was seen by Enid Cutter and treated with antibiotics.  Doing well from a pain standpoint.  No recurrent anginal episodes.  He did have a vagal episode on 06/08/2022 while at the cardiologist office.  He said he had noticed some episodes where he got dizzy if he stood up out of a car quickly.  No issues while he is walking.  Past Medical History:  Diagnosis Date   ADHD (attention deficit hyperactivity disorder)    Anxiety state, unspecified    Bipolar 1 disorder    CHEST PAIN 12/04/2008   Qualifier: Diagnosis of  By: Glori Bickers MD, Carmell Austria    Depressive disorder, not elsewhere classified    Dermatophytosis of nail    Esophageal reflux    HYPERGLYCEMIA 07/10/2007   Qualifier: Diagnosis of  By: Glori Bickers MD, Carmell Austria    Kidney stones 09/22/2010   Low back pain 09/18/2013   Other chronic nonalcoholic liver disease    Problems with hearing    Prostatitis, unspecified    Pure hypercholesterolemia    Screen for STD (sexually transmitted  disease) 04/20/2011   Sprain of cruciate ligament of knee    Tear of medial cartilage or meniscus of knee, current    TMJ (temporomandibular joint disorder) 12/25/2010   Unspecified essential hypertension    Unspecified gastritis and gastroduodenitis without mention of hemorrhage    Urethritis, unspecified     Current Outpatient Medications  Medication Sig Dispense Refill   acetaminophen (TYLENOL) 325 MG tablet Take 2 tablets (650 mg total) by mouth every 4 (four) hours as needed for moderate pain or headache.     ALPRAZolam (XANAX) 1 MG tablet Take 1 mg by mouth Once daily as needed for anxiety.     amiodarone (PACERONE) 200 MG tablet Take 1 tablet (200 mg total) by mouth daily. 90 tablet 3   amLODipine (NORVASC) 5 MG tablet Take 1 tablet (5 mg total) by mouth daily. 30 tablet 1   apixaban (ELIQUIS) 5 MG TABS tablet Take 1 tablet (5 mg total) by mouth 2 (two) times daily. 60 tablet 1   aspirin EC 81 MG tablet Take 1 tablet (81 mg total) by mouth daily. Swallow whole. 30 tablet 12   atorvastatin (LIPITOR) 80 MG tablet Take 1 tablet (80 mg total) by mouth daily. 30 tablet 1   cefdinir (OMNICEF) 300 MG capsule Take 1 capsule (300 mg total) by mouth 2 (two) times daily. 14 capsule  1   fish oil-omega-3 fatty acids 1000 MG capsule Take 2 g by mouth daily.     isosorbide mononitrate (IMDUR) 30 MG 24 hr tablet Take 1 tablet (30 mg total) by mouth daily. 30 tablet 1   metoprolol tartrate (LOPRESSOR) 25 MG tablet Take 1 tablet (25 mg total) by mouth 2 (two) times daily. 180 tablet 3   Multiple Vitamin (MULTIVITAMIN) capsule Take 1 capsule by mouth daily.     oxyCODONE (OXY IR/ROXICODONE) 5 MG immediate release tablet Take 1 tablet (5 mg total) by mouth every 6 (six) hours as needed for severe pain. 28 tablet 0   sertraline (ZOLOFT) 100 MG tablet Take 200 mg by mouth daily.     tamsulosin (FLOMAX) 0.4 MG CAPS capsule Take 0.4 mg by mouth daily.     traZODone (DESYREL) 50 MG tablet Take 100 mg by mouth  at bedtime as needed for sleep.     No current facility-administered medications for this visit.    Physical Exam BP 125/75   Pulse 61   Resp 20   Ht 5\' 6"  (1.676 m)   Wt 207 lb (93.9 kg)   SpO2 95% Comment: RA  BMI 33.57 kg/m  57 year old man in no acute distress Alert and oriented x 3 with no focal deficit Lungs clear with equal breath sounds bilaterally Cardiac regular rate and rhythm Sternum stable, incision healing well Small area of redness central portion left arm incision, opened sterilely with bloody drainage, no purulence Leg incision healing well, no peripheral edema  Diagnostic Tests: CHEST - 2 VIEW   COMPARISON:  05/15/2022   FINDINGS: Cardiac silhouette enlarged. No evidence of pneumothorax or pleural effusion. No evidence of pulmonary edema. No osseous abnormalities identified. Patient is status post median sternotomy and CABG.   IMPRESSION: Enlarged cardiac silhouette.  Lungs are clear.     Electronically Signed   By: Sammie Bench M.D.   On: 06/22/2022 12:57 I personally reviewed the chest x-ray images.  Normal postoperative appearance.  Impression: Glenn Stewart is a 57 year old man with a history of hypertension, hyperlipidemia, bipolar disorder, anxiety, hearing loss, reflux, and nonalcoholic fatty liver, who presented with transient facial droop and left face numbness.  Ruled out for stroke and MI.  Catheterization showed severe three-vessel coronary disease.  Three-vessel coronary disease-status post coronary bypass grafting x 5, including a radial artery graft.  Doing well from a surgical standpoint.  Has had some orthostatic dizziness and did have a vagal episode at his cardiologist appointment.  That is slowly improving.  Left arm incision.  Small area where the incision was dehisced.  Suspected a suture granuloma.  When opened there was some bloody, nonpurulent drainage but no suture was found.  Recommended that he put Neosporin on that  and keep covered if it is draining.  He will contact us if there is increased swelling or redness.  He may begin driving.  Appropriate precautions were discussed.  Instructed him not to lift anything over 10 pounds until 6 weeks postoperatively.  He works on motorcycles.  I told him that at 2 months he can start doing some part-time work if he chooses but it would probably be 3 months before he can go back to work full-time.  Postoperative atrial fibrillation-currently on amiodarone and Eliquis.  He is supposed to have an event monitor, but that has not been arranged yet.  He is going to follow-up with cardiology regarding that.  Okay from my standpoint to stop amiodarone and Eliquis  once cleared by cardiology.  Plan: May gradually resume activities Follow-up with cardiology regarding event monitor Return in 4 weeks prior to returning to work full-time.  [name], MD [facility] [phone]

## 2022-06-23 ENCOUNTER — Telehealth: Payer: Self-pay

## 2022-06-23 MED ORDER — APIXABAN 5 MG PO TABS: 5.0000 mg | ORAL_TABLET | Freq: Two times a day (BID) | ORAL | 0 refills | Status: DC

## 2022-06-23 NOTE — Telephone Encounter (Signed)
Patient walked into the office with [place] patient assistance application. Patient asked about heart monitor that he has not received and if he could get samples of Eliquis. Faxed application to the patient foundation. Contacted [place] about the monitor upon review monitor order was linked to original office visit and will be mailed out to patient. Instructed patient to make sure he answer the call from [employer] that there were calling to verify mailing address. Samples given to patient. Medication Samples have been provided to the patient.  Drug name: Eliquis   Strength: 5 mg  Qty: 2 boxes LOT: [id number] Exp.Date: 08/2023  Dosing instructions: Take 1 tablet by mouth 2 times a day   The patient has been instructed regarding the correct time, dose, and frequency of taking this medication, including desired effects and most common side effects.     Faxed application given to Veronda Prude, LPN for Sande Rives, PA-C

## 2022-06-28 ENCOUNTER — Other Ambulatory Visit (HOSPITAL_COMMUNITY): Payer: Self-pay

## 2022-07-02 DIAGNOSIS — I480 Paroxysmal atrial fibrillation: Secondary | ICD-10-CM | POA: Diagnosis not present

## 2022-07-07 ENCOUNTER — Other Ambulatory Visit (HOSPITAL_COMMUNITY): Payer: Self-pay

## 2022-07-07 ENCOUNTER — Telehealth: Payer: Self-pay | Admitting: Cardiology

## 2022-07-07 NOTE — Telephone Encounter (Signed)
  Patient is calling stating that he brought forms to be filled out for his Eliquis. There was information missing that the company is asking to be filled out. He would like to follow up about the paperwork and he will also need to see if he can get more samples since it is going to take longer for him to get his medication. Please advise.

## 2022-07-07 NOTE — Telephone Encounter (Signed)
Routed to [facility] CMA regarding Eliquis assistance paperwork  Routed to PharmD regarding request for samples

## 2022-07-08 ENCOUNTER — Telehealth: Payer: Self-pay | Admitting: Cardiology

## 2022-07-08 MED ORDER — ISOSORBIDE MONONITRATE ER 30 MG PO TB24: 30.0000 mg | ORAL_TABLET | Freq: Every day | ORAL | 3 refills | Status: DC

## 2022-07-08 MED ORDER — APIXABAN 5 MG PO TABS: 5.0000 mg | ORAL_TABLET | Freq: Two times a day (BID) | ORAL | 1 refills | Status: DC

## 2022-07-08 NOTE — Telephone Encounter (Signed)
*  STAT* If patient is at the pharmacy, call can be transferred to refill team.   1. Which medications need to be refilled? (please list name of each medication and dose if known)   isosorbide mononitrate (IMDUR) 30 MG 24 hr tablet   2. Which pharmacy/location (including street and city if local pharmacy) is medication to be sent to?  [id number] - [place], [place] - [address]   3. Do they need a 30 day or 90 day supply?   90 day  Patient stated he still has some of this medication left.

## 2022-07-08 NOTE — Telephone Encounter (Signed)
Left detailed message about PHARMD message about insurance

## 2022-07-08 NOTE — Telephone Encounter (Signed)
Spoke with patient that medication has been sent to the correct pharmacy and he can pick up.

## 2022-07-08 NOTE — Telephone Encounter (Signed)
Pt's medication was sent to pt's pharmacy as requested. Confirmation received.  °

## 2022-07-08 NOTE — Telephone Encounter (Signed)
RECEIVED LETTER ASKING IF PT IS BEING TREATED AS INPATIENT OR OUTPATIENT, OUTPATIENT WRITTEN ON LETTER RECEIVED AND FAXED BACK TO FAX NUMBER ON FORM. Waiting for answer from [facility] on samples.  Left detailed message as noted for patient (DPR), call back w/any questions.

## 2022-07-08 NOTE — Telephone Encounter (Signed)
Doesn't look like anyone ever sent in Eliquis to his outpt pharmacy, just the [facility] pharmacy at the hospital. I have sent in rx to his pharmacy.

## 2022-07-08 NOTE — Telephone Encounter (Signed)
Prior mention of pt being uninsured but he has active [employer] showing up in [place]. Would clarify this with pt, if he has Medicaid, they cover Eliquis well and he should just have rx filled at his pharmacy. If he does not have any insurance, ok to give him samples while waiting for pt assistance to be completed.

## 2022-07-08 NOTE — Telephone Encounter (Signed)
Spoke to the patient, he does use [employer]. Explained Pharm D recommendations:  Prior mention of pt being uninsured but he has active [employer] showing up in [place]. Would clarify this with pt, if he has Medicaid, they cover Eliquis well and he should just have rx filled at his pharmacy. If he does not have any insurance, ok to give him samples while waiting for pt assistance to be completed.   Patient voiced understanding. Will forward to Pharm D.

## 2022-07-08 NOTE — Telephone Encounter (Signed)
Pt c/o medication issue:  1. Name of Medication:   apixaban (ELIQUIS) 5 MG TABS tablet   2. How are you currently taking this medication (dosage and times per day)?  As prescribed  3. Are you having a reaction (difficulty breathing--STAT)?  No  4. What is your medication issue?    Patient stated he now has Medicaid and wants prescription sent to [id number] - [place], [place] - [address]  Patient stated he has about a week's worth of this medication left.

## 2022-07-12 ENCOUNTER — Other Ambulatory Visit (HOSPITAL_COMMUNITY): Payer: Self-pay

## 2022-07-15 ENCOUNTER — Telehealth: Payer: Self-pay | Admitting: Cardiology

## 2022-07-15 MED ORDER — ATORVASTATIN CALCIUM 80 MG PO TABS: 80.0000 mg | ORAL_TABLET | Freq: Every day | ORAL | 3 refills | Status: DC

## 2022-07-15 NOTE — Telephone Encounter (Signed)
*  STAT* If patient is at the pharmacy, call can be transferred to refill team.   1. Which medications need to be refilled? (please list name of each medication and dose if known) atorvastatin (LIPITOR) 80 MG tablet  2. Which pharmacy/location (including street and city if local pharmacy) is medication to be sent to? [id number] - [place], [place] - [address]  3. Do they need a 30 day or 90 day supply? 90  

## 2022-07-15 NOTE — Telephone Encounter (Signed)
Refill for Atorvastatin has been sent to [facility].

## 2022-07-15 NOTE — Telephone Encounter (Signed)
Returned call to patient and advised him that per ordering providers note patient to wear for 30 days.   Advised patient to call back to office with any issues, questions, or concerns. Patient verbalized understanding.

## 2022-07-15 NOTE — Telephone Encounter (Signed)
Calling to see how long he is suppose to wear heart monitor. Please advise

## 2022-07-20 ENCOUNTER — Ambulatory Visit: Payer: Self-pay

## 2022-07-21 DIAGNOSIS — Z419 Encounter for procedure for purposes other than remedying health state, unspecified: Secondary | ICD-10-CM | POA: Diagnosis not present

## 2022-08-05 ENCOUNTER — Ambulatory Visit: Payer: Medicaid Other | Attending: Student

## 2022-08-21 DIAGNOSIS — Z419 Encounter for procedure for purposes other than remedying health state, unspecified: Secondary | ICD-10-CM | POA: Diagnosis not present

## 2022-09-04 NOTE — Progress Notes (Deleted)
Cardiology Office Note:    Date:  09/04/2022   ID:  Glenn Stewart, DOB [birth year], MRN [id number]  PCP:  Glenn Pimple, MD  Cardiologist:  Olga Millers, MD  Electrophysiologist:  None   Referring MD: Glenn Pimple, MD   Chief Complaint: follow-up of CAD and atrial fibrillation  History of Present Illness:    Glenn Stewart is a 57 y.o. male with a history of CAD s/p recent CABG x5 (LIMA-LAD, left radial-OM1, SVG-D1, sequential SVG-OM-PLA) in 04/2022, paroxysmal atrial fibrillation on Amiodarone and Eliquis, mild bilateral carotid stenosis, hypertension, hyperlipidemia, ADHD, and bipolar disorder who is followed by Dr. Jens Som and presents today for routine follow-up.   Patient has a history of chest pain and has been worked up for this in the past. He had a negative stress Echo in 2010. In 08/2017, coronary calcium score came back elevated at 2,076 which put him in the 99th percentile for age and sex. At that time, coronary angiogram was recommended but patient was concerned about cost. However, ETT aroudn this time was negative. He was seen in the ED in 02/2020 with left sided chest pain. Work-up included EKG, troponin, and D-dimer were unremarkable. He was placed on Imdur and did well for a while. He was admitted in 04/2022 after presenting with a syncopal episode and intermittent chest pain. High-sensitivity troponin negative. Echo showed LVEF of 50-55% with inferior basal hypokinesis. LHC showed severe three vessel CAD including CTO of LAD. CT surgery was consulted and patient underwent CABG x5 with LIMA to LAD, left radial to OM1, SVG to D1, and sequential SVG to OM and PLA with Dr. Dorris Fetch on 05/12/2022.  He felt some clicking lateral to seternal wound after being on his side on 05/15/2022 but there was no sternal instability on exam and chest x-ray showed all wires were intact. He did have some post-op atrial fibrillation and was started on Amiodarone and Eliquis.   He was last  seen by me in 05/2022 at which time he was recovering well. He reported some chest soreness around incision sie but no real chest pain. He also reported occasional lightheadedness/ dizziness when standing qucikly which sounded like orthostasis. He also had a vasovagal episode in the office where he became lightheaded, nauseous, and clammy after taking several deep breaths on exam. Systolic BP dropped to the 80s. Patient was laid down and BP improved to 104/70 and symptoms resolved. Lopressor was decreased given soft baseline BP, bradycardia, and orthostasis. A 30-day monitor was ordered to assess for recurrent atrial fibrillation and did not show any recurrence.  He presents today for follow-up. ***  CAD s/p CABG x5 Patient was admitted in 04/2022 for syncope and intermittent chest pain. LHC showed 3 vessel CAD and she underwent CABG x5.  - No angina.  - Continue Amlodipine 5mg  daily, Imdur 30mg  daily, and Lopressor 25mg  twice daily.  - Continue aspirin and high-intensity statin.   Post-Op Atrial Fibrillation Patient was noted to have atrial fibrillation on and off during recent admission in 04/2022 following CABG. He was ultimately started on Amiodarone and Eliquis. 30 Day Event Monitor after last visit showed no recurrent atrial fibrillation. - *** - Continue Lopressor 25mg  daily.  - Will stop Amiodarone given no recurrent atrial fibrillation noted on monitor.  - Continue Eliquis 5mg  twice daily. ***STOP?? - OK with CT Surgery***   Hypertension BP well controlled. *** - Continue current medications: Amlodipine 5mg  daily, Imdur 30mg  daily, and Lopressor 25mg  twice daily.  Hyperlipidemia Lipid panel in 04/2022 during recent admission: Total Cholesterol 182, Triglycerides 180, HDL 38, LDL 108. LDL goal <70 given CAD. - He was switched from Simvastatin to Lipitor 80mg  daily during recent admission. Continue. - Will repeat lipid panel and LFTs. ***   Past Medical History:  Diagnosis Date    ADHD (attention deficit hyperactivity disorder)    Anxiety state, unspecified    Bipolar 1 disorder (HCC)    CHEST PAIN 12/04/2008   Qualifier: Diagnosis of  By: Milinda Antis MD, Colon Flattery    Depressive disorder, not elsewhere classified    Dermatophytosis of nail    Esophageal reflux    HYPERGLYCEMIA 07/10/2007   Qualifier: Diagnosis of  By: Milinda Antis MD, Colon Flattery    Kidney stones 09/22/2010   Low back pain 09/18/2013   Other chronic nonalcoholic liver disease    Problems with hearing    Prostatitis, unspecified    Pure hypercholesterolemia    Screen for STD (sexually transmitted disease) 04/20/2011   Sprain of cruciate ligament of knee    Tear of medial cartilage or meniscus of knee, current    TMJ (temporomandibular joint disorder) 12/25/2010   Unspecified essential hypertension    Unspecified gastritis and gastroduodenitis without mention of hemorrhage    Urethritis, unspecified     Past Surgical History:  Procedure Laterality Date   CORONARY ARTERY BYPASS GRAFT N/A 05/12/2022   Procedure: CORONARY ARTERY BYPASS GRAFTING (CABG) X FIVE BYPASSES USING OPEN LEFT INTERNAL MAMMARY ARTERY, OPEN LEFT RADIAL ARTERY, AND ENDOSCOPIC RIGHT GREATER SAPHENOUS VEIN HARVEST.;  Surgeon: Loreli Slot, MD;  Location: [facility] OR;  Service: Open Heart Surgery;  Laterality: N/A;   LEFT HEART CATH AND CORONARY ANGIOGRAPHY N/A 05/10/2022   Procedure: LEFT HEART CATH AND CORONARY ANGIOGRAPHY;  Surgeon: Kathleene Hazel, MD;  Location: [facility] INVASIVE CV LAB;  Service: Cardiovascular;  Laterality: N/A;   No prior surgery     RADIAL ARTERY HARVEST Left 05/12/2022   Procedure: RADIAL ARTERY HARVEST;  Surgeon: Loreli Slot, MD;  Location: [facility] OR;  Service: Open Heart Surgery;  Laterality: Left;   TEE WITHOUT CARDIOVERSION N/A 05/12/2022   Procedure: TRANSESOPHAGEAL ECHOCARDIOGRAM;  Surgeon: Loreli Slot, MD;  Location: [facility] OR;  Service: Open Heart Surgery;  Laterality: N/A;    Current  Medications: No outpatient medications have been marked as taking for the 09/09/22 encounter (Appointment) with Corrin Parker, PA-C.     Allergies:   Sulfonamide derivatives   Social History   Socioeconomic History   Marital status: Single    Spouse name: Not on file   Number of children: 0   Years of education: Not on file   Highest education level: Not on file  Occupational History   Occupation: Owns motorcycle shop  Tobacco Use   Smoking status: Never   Smokeless tobacco: Never  Vaping Use   Vaping Use: Never used  Substance and Sexual Activity   Alcohol use: Yes    Comment: rare   Drug use: No   Sexual activity: Not on file  Other Topics Concern   Not on file  Social History Narrative   Not on file   Social Determinants of Health   Financial Resource Strain: Not on file  Food Insecurity: No Food Insecurity (05/10/2022)   Hunger Vital Sign    Worried About Running Out of Food in the Last Year: Never true    Ran Out of Food in the Last Year: Never true  Transportation Needs: No Transportation  Needs (05/10/2022)   PRAPARE - Administrator, Civil Service (Medical): No    Lack of Transportation (Non-Medical): No  Physical Activity: Not on file  Stress: Not on file  Social Connections: Not on file     Family History: The patient's family history includes Diabetes in his father; Heart failure in his father; Hypertension in his father.  ROS:   Please see the history of present illness.     EKGs/Labs/Other Studies Reviewed:    The following studies were reviewed:  Exercise Tolerance Test 08/30/2017: The patient walked for 7 minutes and 57 seconds of a standard Bruce protocol treadmill test. He achieved a peak heart rate of 144 which is 86% predicted maximal heart rate. At peak exercise there were no ST or T wave changes. Blood pressure response to exercise was normal. This is interpreted as a negative stress test. _______________   CT Cardiac  Score 09/01/2017: Coronary calcium score of 2076. This was 20 percentile for age and sex matched control. _______________   Echocardiogram 05/10/2022: Impressions: 1. Inferior basal hypokinesis . Left ventricular ejection fraction, by  estimation, is 50 to 55%. The left ventricle has low normal function. The  left ventricle demonstrates regional wall motion abnormalities (see  scoring diagram/findings for  description). Left ventricular diastolic parameters were normal.   2. Right ventricular systolic function is normal. The right ventricular  size is normal.   3. Left atrial size was mildly dilated.   4. The mitral valve is abnormal. Trivial mitral valve regurgitation. No  evidence of mitral stenosis.   5. The aortic valve is tricuspid. There is mild calcification of the  aortic valve. Aortic valve regurgitation is not visualized. Aortic valve  sclerosis is present, with no evidence of aortic valve stenosis.   6. The inferior vena cava is normal in size with greater than 50%  respiratory variability, suggesting right atrial pressure of 3 mmHg.  _______________   Carotid Ultrasound 05/10/2022: Impressions: - Right Carotid: Velocities in the right ICA are consistent with a 1-39% stenosis.  - Left Carotid: Velocities in the left ICA are consistent with a 1-39% stenosis.  - Vertebrals: Bilateral vertebral arteries demonstrate antegrade flow.  - Subclavians: Normal flow hemodynamics were seen in bilateral subclavian arteries.  _______________   Cardiac Catheterization 05/10/2022:   Prox RCA to Mid RCA lesion is 100% stenosed.   1st Mrg lesion is 99% stenosed.   Mid Cx to Dist Cx lesion is 99% stenosed.   Ost LAD to Prox LAD lesion is 70% stenosed.   Mid LAD lesion is 100% stenosed.   1st Diag lesion is 99% stenosed.   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 Severe disease in a moderate caliber  Diagonal branch that arises prior to the total LAD occlusion.  Large caliber Circumflex with diffuse severe distal stenosis leading into the most distal obtuse marginal branch. (Functional CTO). The first obtuse marginal branch is a moderate caliber vessel with severe ostial stenosis.  The RCA is a large dominant artery with chronic total occlusion of the mid vessel The distal vessel fills from right to right collaterals and left to right collaterals.    Recommendations; He has severe three vessel CAD with chronic occlusion of the mid RCA, mid LAD and distal Circumflex with severe disease in the early obtuse marginal branch and in the diagonal branch. Bypass surgery appears to be the best method  for revascularization. Will ask CT surgery to see him today to discuss bypass. Will resume IV heparin 2 hours post sheath pull. Continue ASA, statin and beta blocker.    Diagnostic Dominance: Right      Event Monitor 07/02/2022 to 07/31/2022: Sinus bradycardia, normal sinus rhythm, sinus tachycardia, 4 beats nonsustained tachycardia.   EKG:  EKG not ordered today.   Recent Labs: 05/11/2022: ALT 26 05/18/2022: Magnesium 2.1 06/08/2022: BUN 16; Creatinine, Ser 1.05; Hemoglobin 14.1; Platelets 250; Potassium 4.6; Sodium 141  Recent Lipid Panel    Component Value Date/Time   CHOL 182 05/10/2022 0505   TRIG 180 (H) 05/10/2022 0505   HDL 38 (L) 05/10/2022 0505   CHOLHDL 4.8 05/10/2022 0505   VLDL 36 05/10/2022 0505   LDLCALC 108 (H) 05/10/2022 0505   LDLDIRECT 117.5 04/10/2012 1012    Physical Exam:    Vital Signs: There were no vitals taken for this visit.    Wt Readings from Last 3 Encounters:  06/22/22 207 lb (93.9 kg)  06/14/22 202 lb (91.6 kg)  06/08/22 201 lb 12.8 oz (91.5 kg)     General: 57 y.o. male in no acute distress. HEENT: Normocephalic and atraumatic. Sclera clear. EOMs intact. Neck: Supple. No carotid bruits. No JVD. Heart: *** RRR. Distinct S1 and S2. No murmurs, gallops, or  rubs. Radial and distal pedal pulses 2+ and equal bilaterally. Lungs: No increased work of breathing. Clear to ausculation bilaterally. No wheezes, rhonchi, or rales.  Abdomen: Soft, non-distended, and non-tender to palpation. Bowel sounds present in all 4 quadrants.  MSK: Normal strength and tone for age. *** Extremities: No lower extremity edema.    Skin: Warm and dry. Neuro: Alert and oriented x3. No focal deficits. Psych: Normal affect. Responds appropriately.   Assessment:    No diagnosis found.  Plan:     Disposition: Follow up in ***   Medication Adjustments/Labs and Tests Ordered: Current medicines are reviewed at length with the patient today.  Concerns regarding medicines are outlined above.  No orders of the defined types were placed in this encounter.  No orders of the defined types were placed in this encounter.   There are no Patient Instructions on file for this visit.   Signed, Corrin Parker, PA-C  09/04/2022 8:12 PM    [facility]

## 2022-09-09 ENCOUNTER — Ambulatory Visit: Payer: Medicaid Other | Admitting: Student

## 2022-09-13 NOTE — Progress Notes (Addendum)
Cardiology Office Note:    Date:  09/22/2022   ID:  Glenn Stewart, DOB 1966-03-14, MRN [id number]  PCP:  Glenn Pimple, MD  Cardiologist:  Glenn Millers, MD  Electrophysiologist:  None   Referring MD: Glenn Pimple, MD   Chief Complaint: follow-up of CAD and atrial fibrillation  History of Present Illness:    Glenn Stewart is a 57 y.o. male with a history of CAD s/p recent CABG x5 (LIMA-LAD, left radial-OM1, SVG-D1, sequential SVG-OM-PLA) in 04/2022, paroxysmal atrial fibrillation on Amiodarone and Eliquis, mild bilateral carotid stenosis, hypertension, hyperlipidemia, ADHD, and bipolar disorder who is followed by Dr. Jens Stewart and presents today for routine follow-up.   Patient has a history of chest pain and has been worked up for this in the past. He had a negative stress Echo in 2010. In 08/2017, coronary calcium score came back elevated at 2,076 which put him in the 99th percentile for age and sex. At that time, coronary angiogram was recommended but patient was concerned about cost. However, ETT aroudn this time was negative. He was seen in the ED in 02/2020 with left sided chest pain. Work-up included EKG, troponin, and D-dimer were unremarkable. He was placed on Imdur and did well for a while. He was admitted in 04/2022 after presenting with a syncopal episode and intermittent chest pain. High-sensitivity troponin negative. Echo showed LVEF of 50-55% with inferior basal hypokinesis. LHC showed severe three vessel CAD including CTO of LAD. CT surgery was consulted and patient underwent CABG x5 with LIMA to LAD, left radial to OM1, SVG to D1, and sequential SVG to OM and PLA with Dr. Dorris Fetch on 05/12/2022.  He felt some clicking lateral to seternal wound after being on his side on 05/15/2022 but there was no sternal instability on exam and chest x-ray showed all wires were intact. He did have some post-op atrial fibrillation and was started on Amiodarone and Eliquis.   He was last  seen by me in 05/2022 at which time he was recovering well. He reported some chest soreness around incision site but no real chest pain. He also reported occasional lightheadedness/ dizziness when standing qucikly which sounded like orthostasis. He also had a vasovagal episode in the office where he became lightheaded, nauseous, and clammy after taking several deep breaths on exam. Systolic BP dropped to the 80s. Patient was laid down and BP improved to 104/70 and symptoms resolved. Lopressor was decreased given soft baseline BP, bradycardia, and orthostasis. A 30-day monitor was ordered to assess for recurrent atrial fibrillation and did not show any recurrence.  He presents today for follow-up. He is doing great today. He denies any chest pain, shortness of breath, orthopnea, PND, edema, palpitations, lightheadedness, dizziness, or syncope. He states he feels so much better than he did before his CABG and is able to be more active.  EKGs/Labs/Other Studies Reviewed:    The following studies were reviewed:  Exercise Tolerance Test 08/30/2017: The patient walked for 7 minutes and 57 seconds of a standard Bruce protocol treadmill test. He achieved a peak heart rate of 144 which is 86% predicted maximal heart rate. At peak exercise there were no ST or T wave changes. Blood pressure response to exercise was normal. This is interpreted as a negative stress test. _______________   CT Cardiac Score 09/01/2017: Coronary calcium score of 2076. This was 74 percentile for age and sex matched control. _______________   Echocardiogram 05/10/2022: Impressions: 1. Inferior basal hypokinesis . Left  ventricular ejection fraction, by  estimation, is 50 to 55%. The left ventricle has low normal function. The  left ventricle demonstrates regional wall motion abnormalities (see  scoring diagram/findings for  description). Left ventricular diastolic parameters were normal.   2. Right ventricular systolic function is  normal. The right ventricular  size is normal.   3. Left atrial size was mildly dilated.   4. The mitral valve is abnormal. Trivial mitral valve regurgitation. No  evidence of mitral stenosis.   5. The aortic valve is tricuspid. There is mild calcification of the  aortic valve. Aortic valve regurgitation is not visualized. Aortic valve  sclerosis is present, with no evidence of aortic valve stenosis.   6. The inferior vena cava is normal in size with greater than 50%  respiratory variability, suggesting right atrial pressure of 3 mmHg.  _______________   Carotid Ultrasound 05/10/2022: Impressions: - Right Carotid: Velocities in the right ICA are consistent with a 1-39% stenosis.  - Left Carotid: Velocities in the left ICA are consistent with a 1-39% stenosis.  - Vertebrals: Bilateral vertebral arteries demonstrate antegrade flow.  - Subclavians: Normal flow hemodynamics were seen in bilateral subclavian arteries.  _______________   Cardiac Catheterization 05/10/2022:   Prox RCA to Mid RCA lesion is 100% stenosed.   1st Mrg lesion is 99% stenosed.   Mid Cx to Dist Cx lesion is 99% stenosed.   Ost LAD to Prox LAD lesion is 70% stenosed.   Mid LAD lesion is 100% stenosed.   1st Diag lesion is 99% stenosed.   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 Severe disease in a moderate caliber Diagonal branch that arises prior to the total LAD occlusion.  Large caliber Circumflex with diffuse severe distal stenosis leading into the most distal obtuse marginal branch. (Functional CTO). The first obtuse marginal branch is a moderate caliber vessel with severe ostial stenosis.  The RCA is a large dominant artery with chronic total occlusion of the mid vessel The distal vessel fills from right to right collaterals and left to right collaterals.    Recommendations; He has severe three vessel CAD with chronic occlusion  of the mid RCA, mid LAD and distal Circumflex with severe disease in the early obtuse marginal branch and in the diagonal branch. Bypass surgery appears to be the best method for revascularization. Will ask CT surgery to see him today to discuss bypass. Will resume IV heparin 2 hours post sheath pull. Continue ASA, statin and beta blocker.    Diagnostic Dominance: Right      Event Monitor 07/02/2022 to 07/31/2022: Sinus bradycardia, normal sinus rhythm, sinus tachycardia, 4 beats nonsustained tachycardia.   EKG:  EKG not ordered today.   Recent Labs: 05/11/2022: ALT 26 05/18/2022: Magnesium 2.1 06/08/2022: BUN 16; Creatinine, Ser 1.05; Hemoglobin 14.1; Platelets 250; Potassium 4.6; Sodium 141  Recent Lipid Panel    Component Value Date/Time   CHOL 182 05/10/2022 0505   TRIG 180 (H) 05/10/2022 0505   HDL 38 (L) 05/10/2022 0505   CHOLHDL 4.8 05/10/2022 0505   VLDL 36 05/10/2022 0505   LDLCALC 108 (H) 05/10/2022 0505   LDLDIRECT 117.5 04/10/2012 1012    Physical Exam:    Vital Signs: BP 128/74 (BP Location: Left Arm, Patient Position: Sitting, Cuff Size: Normal)   Pulse 63   Ht 5\' 6"  (1.676 m)   Wt 215 lb 3.2 oz (97.6 kg)  SpO2 92%   BMI 34.73 kg/m     Wt Readings from Last 3 Encounters:  09/22/22 215 lb 3.2 oz (97.6 kg)  06/22/22 207 lb (93.9 kg)  06/14/22 202 lb (91.6 kg)     General: 57 y.o. Caucasian male in no acute distress. HEENT: Normocephalic and atraumatic. Sclera clear. EOMs intact. Neck: Supple. No carotid bruits. No JVD. Heart: RRR. Distinct S1 and S2. No murmurs, gallops, or rubs. Radial and distal pedal pulses 2+ and equal bilaterally. Lungs: No increased work of breathing. Clear to ausculation bilaterally. No wheezes, rhonchi, or rales.  Abdomen: Soft, non-distended, and non-tender to palpation.  MSK: Normal strength and tone for age.  Extremities: No lower extremity edema.    Skin: Warm and dry. Neuro: Alert and oriented x3. No focal deficits. Psych:  Normal affect. Responds appropriately.   Assessment:    1. Coronary artery disease involving native coronary artery of native heart without angina pectoris   2. S/P CABG x 5   3. Postoperative atrial fibrillation (HCC)   4. Primary hypertension   5. Hyperlipidemia, unspecified hyperlipidemia type     Plan:    CAD s/p CABG x5 Patient was admitted in 04/2022 for syncope and intermittent chest pain. LHC showed 3 vessel CAD and she underwent CABG x5.  - No angina.  - Continue Amlodipine 5mg  daily, Imdur 30mg  daily, and Lopressor 25mg  twice daily.  - Continue aspirin and high-intensity statin.   Post-Op Atrial Fibrillation Patient was noted to have atrial fibrillation on and off during recent admission in 04/2022 following CABG. He was ultimately started on Amiodarone and Eliquis. 30 Day Event Monitor after last visit showed no recurrent atrial fibrillation. - Maintaining sinus rhythm on exam.  - Continue Lopressor 25mg  daily.  - Will stop Amiodarone given no recurrent atrial fibrillation noted on monitor.  - Currently on Eliquis 5mg  twice daily. Per Dr. Sunday Corn last office visit note in 06/2022, OK to stop Eliquis. Will make sure Dr. Jens Stewart is okay with this as well.   Hypertension BP well controlled.  - Continue current medications: Amlodipine 5mg  daily, Imdur 30mg  daily, and Lopressor 25mg  twice daily.    Hyperlipidemia Lipid panel in 04/2022 during recent admission: Total Cholesterol 182, Triglycerides 180, HDL 38, LDL 108. LDL goal <70 given CAD. - He was switched from Simvastatin to Lipitor 80mg  daily during recent admission. Continue. - He is not fasting today so will come back for fasting lipid panel and LFTs.   Disposition: Follow up in 6 months.   ADDENDUM 09/27/2022: Spoke with Dr. Jens Stewart who agrees that Eliquis can be stopped given atrial fibrillation occurred in post-op setting and he has had no recurrence. Will continue Aspirin 81mg  daily. Notified patient of this  recommendation via MyChart. Will remove Eliquis from medication list.    Medication Adjustments/Labs and Tests Ordered: Current medicines are reviewed at length with the patient today.  Concerns regarding medicines are outlined above.  Orders Placed This Encounter  Procedures   Hepatic function panel   Lipid panel   No orders of the defined types were placed in this encounter.   Patient Instructions  Medication Instructions:  STOP AMIODARONE. CONTINUE THE REST OF YOUR PRESCRIBED MEDICATIONS *If you need a refill on your cardiac medications before your next appointment, please call your pharmacy*   Lab Work: RETURN FOR FASTING LABS If you have labs (blood work) drawn today and your tests are completely normal, you will receive your results only by: MyChart Message (if you have  MyChart) OR A paper copy in the mail If you have any lab test that is abnormal or we need to change your treatment, we will call you to review the results.   Testing/Procedures: NONE   Follow-Up: At [facility], you and your health needs are our priority.  As part of our continuing mission to provide you with exceptional heart care, we have created designated Provider Care Teams.  These Care Teams include your primary Cardiologist (physician) and Advanced Practice Providers (APPs -  Physician Assistants and Nurse Practitioners) who all work together to provide you with the care you need, when you need it.  We recommend signing up for the patient portal called "MyChart".  Sign up information is provided on this After Visit Summary.  MyChart is used to connect with patients for Virtual Visits (Telemedicine).  Patients are able to view lab/test results, encounter notes, upcoming appointments, etc.  Non-urgent messages can be sent to your provider as well.   To learn more about what you can do with MyChart, go to [email or web address].    Your next appointment:   6 month(s)  Provider:   Marjie Skiff, PA-C           Signed, Corrin Parker, PA-C  09/23/2022 12:23 AM    [facility]

## 2022-09-16 ENCOUNTER — Other Ambulatory Visit (HOSPITAL_COMMUNITY): Payer: Self-pay

## 2022-09-20 DIAGNOSIS — Z419 Encounter for procedure for purposes other than remedying health state, unspecified: Secondary | ICD-10-CM | POA: Diagnosis not present

## 2022-09-22 ENCOUNTER — Ambulatory Visit: Payer: Medicaid Other | Attending: Student | Admitting: Student

## 2022-09-22 VITALS — BP 128/74 | HR 63 | Ht 66.0 in | Wt 215.2 lb

## 2022-09-22 DIAGNOSIS — I2510 Atherosclerotic heart disease of native coronary artery without angina pectoris: Secondary | ICD-10-CM | POA: Diagnosis not present

## 2022-09-22 DIAGNOSIS — I4891 Unspecified atrial fibrillation: Secondary | ICD-10-CM | POA: Diagnosis not present

## 2022-09-22 DIAGNOSIS — I10 Essential (primary) hypertension: Secondary | ICD-10-CM | POA: Diagnosis not present

## 2022-09-22 DIAGNOSIS — E785 Hyperlipidemia, unspecified: Secondary | ICD-10-CM

## 2022-09-22 DIAGNOSIS — Z951 Presence of aortocoronary bypass graft: Secondary | ICD-10-CM

## 2022-09-22 DIAGNOSIS — I9789 Other postprocedural complications and disorders of the circulatory system, not elsewhere classified: Secondary | ICD-10-CM | POA: Diagnosis not present

## 2022-09-22 NOTE — Patient Instructions (Signed)
Medication Instructions:  STOP AMIODARONE. CONTINUE THE REST OF YOUR PRESCRIBED MEDICATIONS *If you need a refill on your cardiac medications before your next appointment, please call your pharmacy*   Lab Work: RETURN FOR FASTING LABS If you have labs (blood work) drawn today and your tests are completely normal, you will receive your results only by: MyChart Message (if you have MyChart) OR A paper copy in the mail If you have any lab test that is abnormal or we need to change your treatment, we will call you to review the results.   Testing/Procedures: NONE   Follow-Up: At [facility], you and your health needs are our priority.  As part of our continuing mission to provide you with exceptional heart care, we have created designated Provider Care Teams.  These Care Teams include your primary Cardiologist (physician) and Advanced Practice Providers (APPs -  Physician Assistants and Nurse Practitioners) who all work together to provide you with the care you need, when you need it.  We recommend signing up for the patient portal called "MyChart".  Sign up information is provided on this After Visit Summary.  MyChart is used to connect with patients for Virtual Visits (Telemedicine).  Patients are able to view lab/test results, encounter notes, upcoming appointments, etc.  Non-urgent messages can be sent to your provider as well.   To learn more about what you can do with MyChart, go to [email or web address].    Your next appointment:   6 month(s)  Provider:   Marjie Skiff, PA-C

## 2022-09-23 ENCOUNTER — Encounter: Payer: Self-pay | Admitting: Student

## 2022-09-27 NOTE — Addendum Note (Signed)
Addended by: Marjie Skiff on: 09/27/2022 07:40 AM   Modules accepted: Orders

## 2022-10-04 ENCOUNTER — Telehealth: Payer: Self-pay | Admitting: Cardiology

## 2022-10-04 ENCOUNTER — Other Ambulatory Visit: Payer: Self-pay | Admitting: *Deleted

## 2022-10-04 MED ORDER — AMLODIPINE BESYLATE 5 MG PO TABS: 5.0000 mg | ORAL_TABLET | Freq: Every day | ORAL | 5 refills | Status: DC

## 2022-10-04 NOTE — Telephone Encounter (Signed)
Follow Up:       Patient says he has no medicine., would you please call it in todayi

## 2022-10-04 NOTE — Telephone Encounter (Signed)
Patient is calling back stating they needed 2 refills for 90 day supply for insurance purposes.

## 2022-10-04 NOTE — Telephone Encounter (Signed)
*  STAT* If patient is at the pharmacy, call can be transferred to refill team.   1. Which medications need to be refilled? (please list name of each medication and dose if known) amLODipine (NORVASC) 5 MG tablet   2. Which pharmacy/location (including street and city if local pharmacy) is medication to be sent to?  [id number] - [place], [place] - [address]    3. Do they need a 30 day or 90 day supply? 90

## 2022-10-05 ENCOUNTER — Telehealth: Payer: Self-pay | Admitting: Cardiology

## 2022-10-05 DIAGNOSIS — I10 Essential (primary) hypertension: Secondary | ICD-10-CM

## 2022-10-05 MED ORDER — AMLODIPINE BESYLATE 5 MG PO TABS: 5.0000 mg | ORAL_TABLET | Freq: Every day | ORAL | 3 refills | Status: DC

## 2022-10-05 NOTE — Telephone Encounter (Signed)
Pt c/o medication issue:  1. Name of Medication: amLODipine (NORVASC) 5 MG tablet   2. How are you currently taking this medication (dosage and times per day)?  Take 1 tablet (5 mg total) by mouth daily.       3. Are you having a reaction (difficulty breathing--STAT)? No  4. What is your medication issue? Pt is requesting a callback regarding his medication refill not being done correctly. He doesn't want the 150 tablets of 5 30 refills over the next 5 months. He'd like 2 90 day refills for insurance purposes he stated and would like to discuss further with nurse. Please advise

## 2022-10-05 NOTE — Telephone Encounter (Signed)
Patient states he called in and asked for refills for amlodipine 5 mg daily 90 tabs with refills because his insurance will only pay for it that way. New Rx sent to pharmacy on file

## 2022-10-21 DIAGNOSIS — Z419 Encounter for procedure for purposes other than remedying health state, unspecified: Secondary | ICD-10-CM | POA: Diagnosis not present

## 2022-11-21 DIAGNOSIS — Z419 Encounter for procedure for purposes other than remedying health state, unspecified: Secondary | ICD-10-CM | POA: Diagnosis not present

## 2022-12-21 DIAGNOSIS — Z419 Encounter for procedure for purposes other than remedying health state, unspecified: Secondary | ICD-10-CM | POA: Diagnosis not present

## 2023-01-21 DIAGNOSIS — Z419 Encounter for procedure for purposes other than remedying health state, unspecified: Secondary | ICD-10-CM | POA: Diagnosis not present

## 2023-02-19 IMAGING — CT CT ABD-PELV W/O CM
2 of 4 series · 16 of 46 positions shown, 18 images · non-contrast
Comparison: 01/07/2014

CLINICAL DATA: Hematuria, RIGHT flank pain, history kidney stones

EXAM:
CT ABDOMEN AND PELVIS WITHOUT CONTRAST
TECHNIQUE: Multidetector CT imaging of the abdomen and pelvis was performed
following the standard protocol without IV contrast. Insert no oral
sagittal and coronal MPR images reconstructed from axial data set.

[Series 2: axial st · axial · 0.86mm/px · z∈[+357,+767]mm · 13 of 94 slices shown, 15 images]
[im 6/94  soft-tissue]
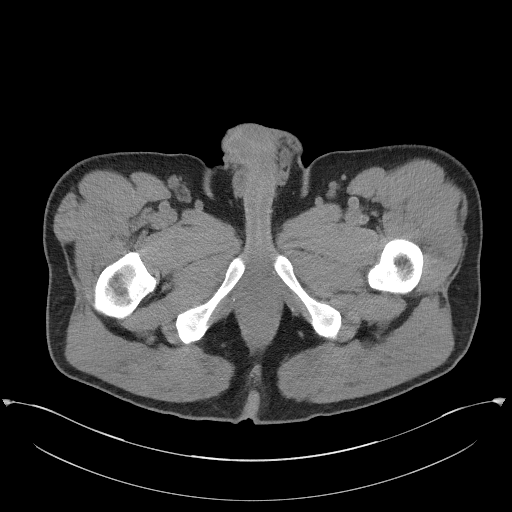
[im 6/94  bone]
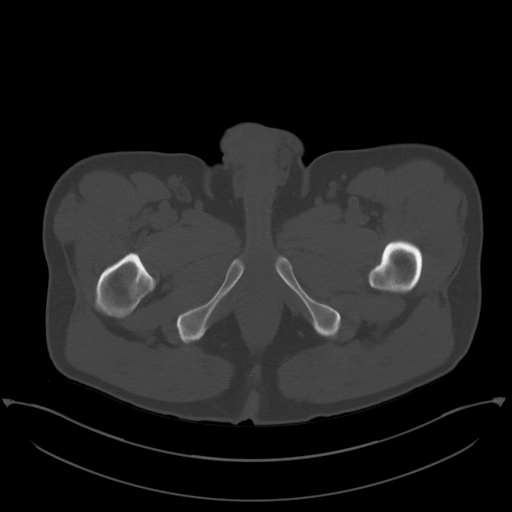
[im 12/94  soft-tissue]
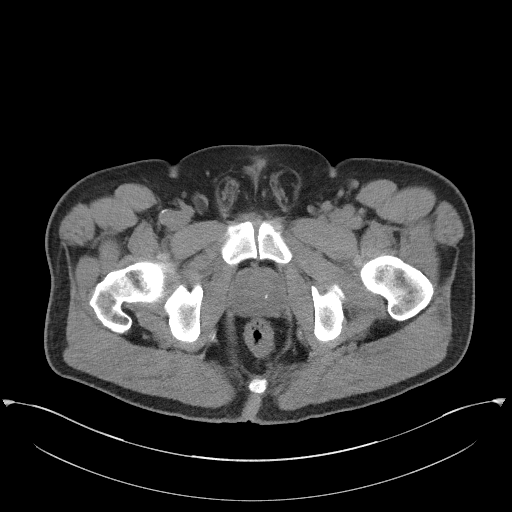
[im 18/94  soft-tissue]
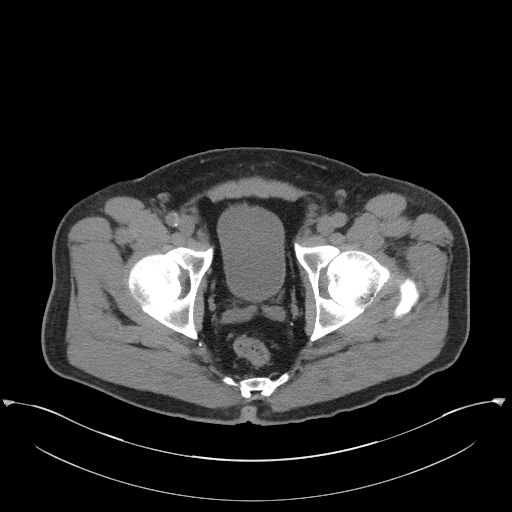
[im 30/94  soft-tissue]
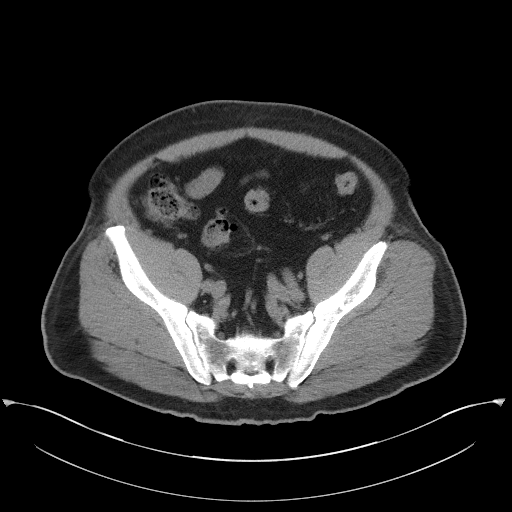
[im 35/94  soft-tissue]
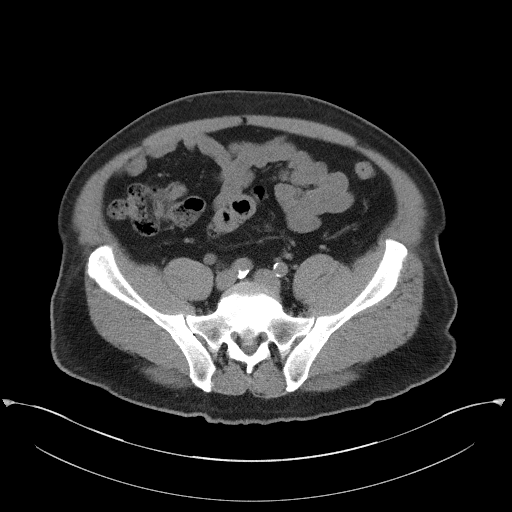
[im 41/94  soft-tissue]
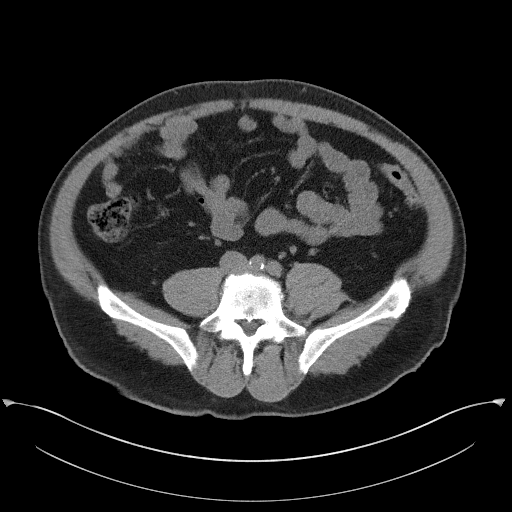
[im 47/94  soft-tissue]
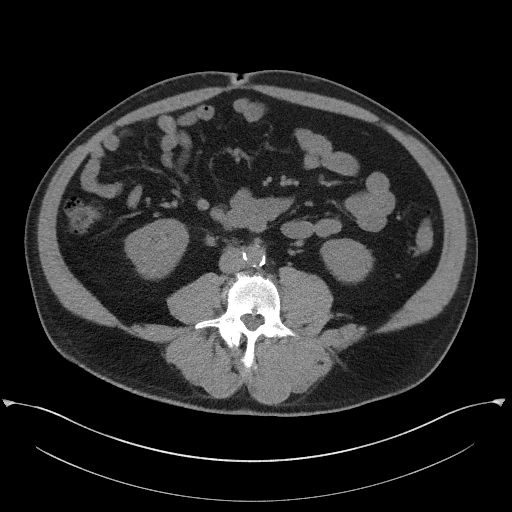
[im 53/94  soft-tissue]
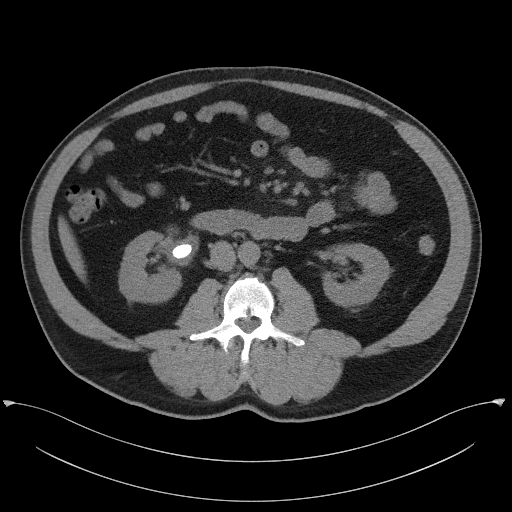
[im 59/94  soft-tissue]
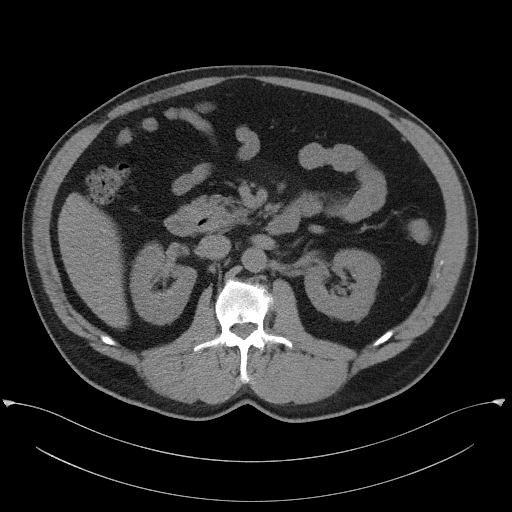
[im 59/94  bone]
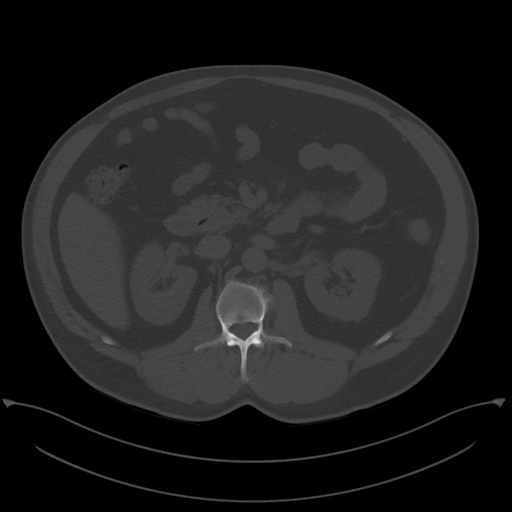
[im 64/94  soft-tissue]
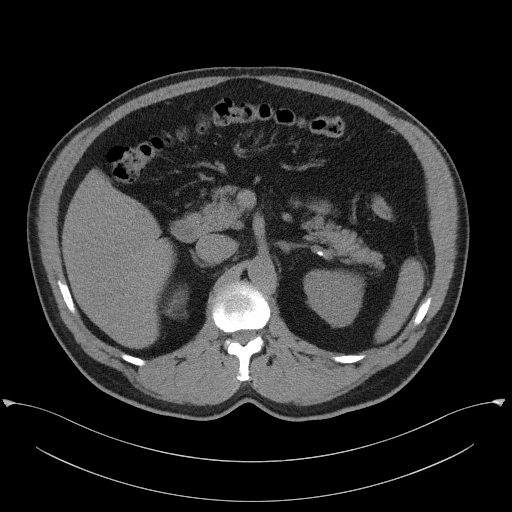
[im 76/94  soft-tissue]
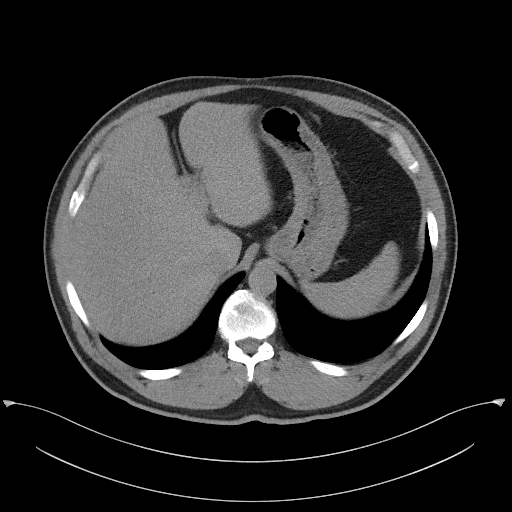
[im 82/94  soft-tissue]
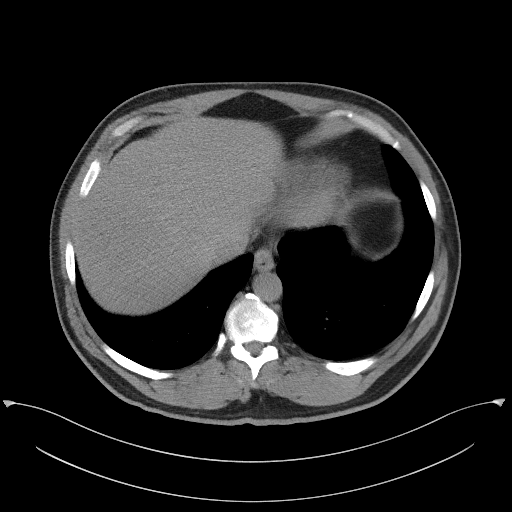
[im 88/94  soft-tissue]
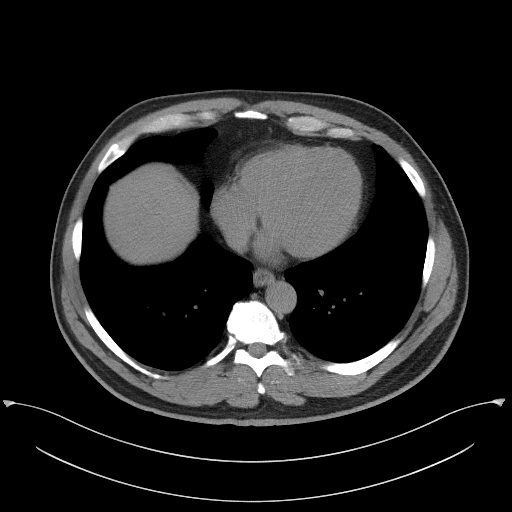

[Series 5: coronal st · coronal · 1.04mm/px · 3 of 104 slices shown]
[im 35/104  soft-tissue]
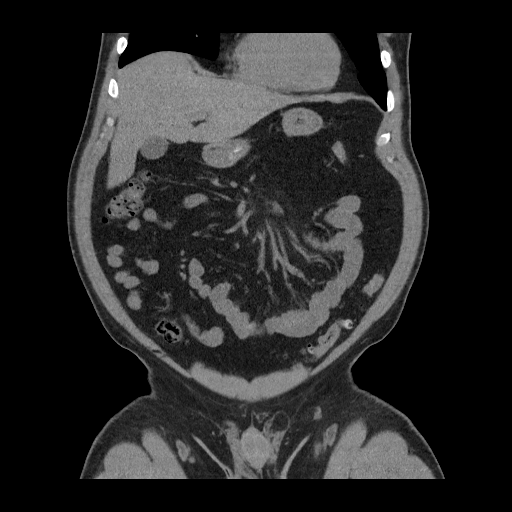
[im 46/104  soft-tissue]
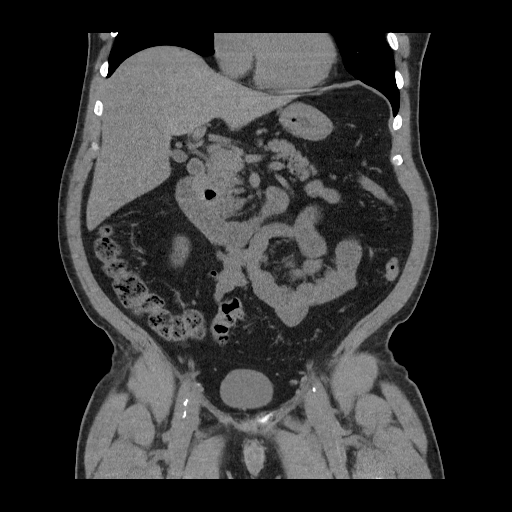
[im 58/104  soft-tissue]
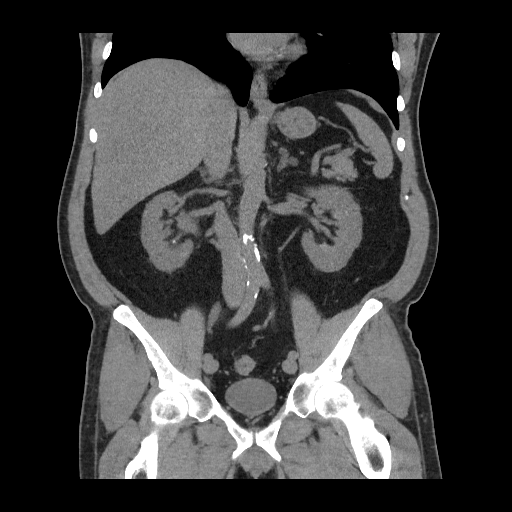

[16 of 46 positions shown; findings below may reference images not displayed]

FINDINGS: Lower chest: Lung bases clear

Hepatobiliary: Gallbladder and liver normal appearance

Pancreas: Normal appearance

Spleen: Normal appearance

Adrenals/Urinary Tract: RIGHT adrenal gland normal. Two
low-attenuation nodules LEFT adrenal gland, 19 x 16 mm adenoma image
33, 21 x 17 mm adenoma image 26. LEFT kidney and LEFT ureter normal.
18 x 11 x 17 mm calculus at RIGHT renal pelvis with minimal
infiltrative changes of the peripelvic fat, could reflect infection;
no RIGHT hydronephrosis identified. Additional tiny RIGHT renal
calculi. No renal masses. RIGHT ureter decompressed with normal
appearing urinary bladder.

Stomach/Bowel: Diverticulosis of descending and sigmoid colon
without evidence of diverticulitis. Normal appendix. Stomach
decompressed. Remaining bowel loops unremarkable.

Vascular/Lymphatic: Atherosclerotic calcifications aorta and iliac
arteries without aneurysm. Minimal coronary arterial calcification.
No adenopathy.

Reproductive: Enlarged prostate gland 4.8 x 4.0 x 4.0 cm (volume =
40 cm^3).

Other: No free air or free fluid. Tiny umbilical hernia containing
fat. Probable small LEFT inguinal hernia containing fat.

Musculoskeletal: Unremarkable
IMPRESSION: 18 x 11 x 17 mm calculus at RIGHT renal pelvis with minimal
infiltrative changes of the peripelvic fat, could reflect infection;
recommend correlation with urinalysis.

Additional tiny RIGHT renal calculi.

No hydronephrosis.

Two LEFT adrenal adenomas.

Distal colonic diverticulosis without evidence of diverticulitis.

Prostatic enlargement.

Tiny umbilical and probable small LEFT inguinal hernias containing
fat.

Aortic Atherosclerosis ([id number]).

## 2023-06-16 ENCOUNTER — Other Ambulatory Visit (HOSPITAL_COMMUNITY): Payer: Self-pay | Admitting: Urology

## 2023-06-16 DIAGNOSIS — C61 Malignant neoplasm of prostate: Secondary | ICD-10-CM

## 2023-06-23 ENCOUNTER — Other Ambulatory Visit: Payer: Self-pay | Admitting: Cardiology

## 2023-06-23 ENCOUNTER — Encounter (HOSPITAL_COMMUNITY)
Admission: RE | Admit: 2023-06-23 | Discharge: 2023-06-23 | Disposition: A | Discharge status: Home / Self Care | Source: Ambulatory Visit | Attending: Urology | Admitting: Urology

## 2023-06-23 DIAGNOSIS — C61 Malignant neoplasm of prostate: Secondary | ICD-10-CM | POA: Diagnosis present

## 2023-06-23 MED ORDER — FLOTUFOLASTAT F 18 GALLIUM 296-5846 MBQ/ML IV SOLN
6.8600 | Freq: Once | INTRAVENOUS | Status: AC
  Administered 2023-06-23: 6.86 via INTRAVENOUS

## 2023-07-16 ENCOUNTER — Other Ambulatory Visit: Payer: Self-pay

## 2023-07-16 ENCOUNTER — Emergency Department (HOSPITAL_COMMUNITY)

## 2023-07-16 ENCOUNTER — Emergency Department (HOSPITAL_COMMUNITY)
Admission: EM | Admit: 2023-07-16 | Discharge: 2023-07-16 | Disposition: A | Discharge status: Home / Self Care | Attending: Emergency Medicine | Admitting: Emergency Medicine

## 2023-07-16 ENCOUNTER — Encounter (HOSPITAL_COMMUNITY): Payer: Self-pay

## 2023-07-16 DIAGNOSIS — K219 Gastro-esophageal reflux disease without esophagitis: Secondary | ICD-10-CM | POA: Insufficient documentation

## 2023-07-16 DIAGNOSIS — Z7982 Long term (current) use of aspirin: Secondary | ICD-10-CM | POA: Insufficient documentation

## 2023-07-16 DIAGNOSIS — I10 Essential (primary) hypertension: Secondary | ICD-10-CM | POA: Diagnosis not present

## 2023-07-16 DIAGNOSIS — Z79899 Other long term (current) drug therapy: Secondary | ICD-10-CM | POA: Diagnosis not present

## 2023-07-16 DIAGNOSIS — Z951 Presence of aortocoronary bypass graft: Secondary | ICD-10-CM | POA: Diagnosis not present

## 2023-07-16 DIAGNOSIS — R072 Precordial pain: Secondary | ICD-10-CM | POA: Diagnosis present

## 2023-07-16 LAB — CBC
HCT: 42.5 % (ref 39.0–52.0)
Hemoglobin: 14.6 g/dL (ref 13.0–17.0)
MCH: 29 pg (ref 26.0–34.0)
MCHC: 34.4 g/dL (ref 30.0–36.0)
MCV: 84.3 fL (ref 80.0–100.0)
Platelets: 226 10*3/uL (ref 150–400)
RBC: 5.04 MIL/uL (ref 4.22–5.81)
RDW: 12.5 % (ref 11.5–15.5)
WBC: 9.1 10*3/uL (ref 4.0–10.5)
nRBC: 0 % (ref 0.0–0.2)

## 2023-07-16 LAB — BASIC METABOLIC PANEL WITH GFR
Anion gap: 13 (ref 5–15)
BUN: 17 mg/dL (ref 6–20)
CO2: 22 mmol/L (ref 22–32)
Calcium: 9.2 mg/dL (ref 8.9–10.3)
Chloride: 104 mmol/L (ref 98–111)
Creatinine, Ser: 1.01 mg/dL (ref 0.61–1.24)
GFR, Estimated: >60 mL/min (ref >60)
Glucose, Bld: 150 mg/dL — ABNORMAL HIGH (ref 70–99)
Potassium: 3.6 mmol/L (ref 3.5–5.1)
Sodium: 139 mmol/L (ref 135–145)

## 2023-07-16 LAB — HEPATIC FUNCTION PANEL
ALT: 41 U/L (ref 0–44)
AST: 35 U/L (ref 15–41)
Albumin: 4 g/dL (ref 3.5–5.0)
Alkaline Phosphatase: 50 U/L (ref 38–126)
Bilirubin, Direct: 0.2 mg/dL (ref 0.0–0.2)
Indirect Bilirubin: 0.6 mg/dL (ref 0.3–0.9)
Total Bilirubin: 0.8 mg/dL (ref 0.0–1.2)
Total Protein: 6.9 g/dL (ref 6.5–8.1)

## 2023-07-16 LAB — LIPASE, BLOOD: Lipase: 31 U/L (ref 11–51)

## 2023-07-16 LAB — TROPONIN I (HIGH SENSITIVITY)
Troponin I (High Sensitivity): 10 ng/L (ref <18)
Troponin I (High Sensitivity): 11 ng/L (ref <18)

## 2023-07-16 MED ORDER — LIDOCAINE VISCOUS HCL 2 % MT SOLN
15.0000 mL | Freq: Once | OROMUCOSAL | Status: AC
  Administered 2023-07-16: 15 mL via ORAL
  Filled 2023-07-16: qty 15

## 2023-07-16 MED ORDER — PANTOPRAZOLE SODIUM 20 MG PO TBEC: 20.0000 mg | DELAYED_RELEASE_TABLET | Freq: Every day | ORAL | 0 refills | Status: DC

## 2023-07-16 MED ORDER — PANTOPRAZOLE SODIUM 40 MG IV SOLR
40.0000 mg | Freq: Once | INTRAVENOUS | Status: AC
  Administered 2023-07-16: 40 mg via INTRAVENOUS
  Filled 2023-07-16: qty 10

## 2023-07-16 MED ORDER — ALUM & MAG HYDROXIDE-SIMETH 200-200-20 MG/5ML PO SUSP
30.0000 mL | Freq: Once | ORAL | Status: AC
  Administered 2023-07-16: 30 mL via ORAL
  Filled 2023-07-16: qty 30

## 2023-07-16 NOTE — ED Provider Notes (Signed)
 [place] [facility] AT [facility] Provider Note   CSN: [id number] Arrival date & time: 07/16/23  1530     History  Chief Complaint  Patient presents with   Chest Pain    Glenn Stewart is a 58 y.o. male status post CABG x 5, history of unstable angina, hypertension, GERD presented for chest pain that was substernal this morning around 10:00.  Pain does not radiate.  Patient describes it first as a pressure but now is a burning sensation.  Patient did vomit up his medications this morning as well.  Patient gets his cardiology care from [facility].  Patient is currently on 81 mg of aspirin  daily.  Home Medications Prior to Admission medications   Medication Sig Start Date End Date Taking? Authorizing Provider  pantoprazole  (PROTONIX ) 20 MG tablet Take 1 tablet (20 mg total) by mouth daily. 07/16/23 08/15/23 Yes Annarae Macnair, Arlin Benes, PA-C  acetaminophen  (TYLENOL ) 325 MG tablet Take 2 tablets (650 mg total) by mouth every 4 (four) hours as needed for moderate pain or headache. 05/19/22   Zimmerman, Donielle M, PA-C  ALPRAZolam  (XANAX ) 1 MG tablet Take 1 mg by mouth Once daily as needed for anxiety. 04/17/11   [provider]  amLODipine  (NORVASC ) 5 MG tablet Take 1 tablet (5 mg total) by mouth daily. 10/05/22 10/05/23  Lenise Quince, MD  aspirin  EC 81 MG tablet Take 1 tablet (81 mg total) by mouth daily. Swallow whole. 05/19/22   Moira Andrews M, PA-C  atorvastatin  (LIPITOR) 80 MG tablet TAKE ONE TABLET BY MOUTH ONCE A DAY 06/23/23   Lenise Quince, MD  cefdinir  (OMNICEF ) 300 MG capsule Take 1 capsule (300 mg total) by mouth 2 (two) times daily. 06/14/22   Roddenberry, Myron G, PA-C  fish oil-omega-3 fatty acids 1000 MG capsule Take 2 g by mouth daily.    [provider]  isosorbide  mononitrate (IMDUR ) 30 MG 24 hr tablet TAKE ONE TABLET BY MOUTH ONCE A DAY 06/23/23   Lenise Quince, MD  metoprolol  tartrate (LOPRESSOR ) 25 MG tablet Take 1 tablet (25 mg  total) by mouth 2 (two) times daily. 06/08/22   Goodrich, Callie E, PA-C  Multiple Vitamin (MULTIVITAMIN) capsule Take 1 capsule by mouth daily.    [provider]  oxyCODONE  (OXY IR/ROXICODONE ) 5 MG immediate release tablet Take 1 tablet (5 mg total) by mouth every 6 (six) hours as needed for severe pain. 05/19/22   Allegra Arch, PA-C  sertraline  (ZOLOFT ) 100 MG tablet Take 200 mg by mouth daily.    [provider]  tamsulosin  (FLOMAX ) 0.4 MG CAPS capsule Take 0.4 mg by mouth daily.    [provider]  traZODone  (DESYREL ) 50 MG tablet Take 100 mg by mouth at bedtime as needed for sleep. Patient not taking: Reported on 09/22/2022    [provider]      Allergies    Sulfonamide derivatives    Review of Systems   Review of Systems  Cardiovascular:  Positive for chest pain.    Physical Exam Updated Vital Signs BP 121/74   Pulse 68   Temp 97.6 F (36.4 C) (Oral)   Resp 16   Ht 5\' 7"  (1.702 m)   Wt 104.3 kg   SpO2 100%   BMI 36.02 kg/m  Physical Exam Vitals reviewed.  Constitutional:      General: He is not in acute distress. HENT:     Head: Normocephalic and atraumatic.  Eyes:  Extraocular Movements: Extraocular movements intact.     Conjunctiva/sclera: Conjunctivae normal.     Pupils: Pupils are equal, round, and reactive to light.  Cardiovascular:     Rate and Rhythm: Normal rate and regular rhythm.     Pulses: Normal pulses.     Heart sounds: Normal heart sounds.     Comments: 2+ bilateral radial/dorsalis pedis pulses with regular rate Pulmonary:     Effort: Pulmonary effort is normal. No respiratory distress.     Breath sounds: Normal breath sounds.  Abdominal:     Palpations: Abdomen is soft.     Tenderness: There is abdominal tenderness (Epigastric tenderness without peritoneal signs). There is no guarding or rebound.  Musculoskeletal:        General: Normal range of motion.     Cervical back: Normal range of motion  and neck supple.     Comments: 5 out of 5 bilateral grip/leg extension strength  Skin:    General: Skin is warm and dry.     Capillary Refill: Capillary refill takes less than 2 seconds.  Neurological:     General: No focal deficit present.     Mental Status: He is alert and oriented to person, place, and time.     Comments: Sensation intact in all 4 limbs  Psychiatric:        Mood and Affect: Mood normal.     ED Results / Procedures / Treatments   Labs (all labs ordered are listed, but only abnormal results are displayed) Labs Reviewed  BASIC METABOLIC PANEL WITH GFR - Abnormal; Notable for the following components:      Result Value   Glucose, Bld 150 (*)    All other components within normal limits  CBC  HEPATIC FUNCTION PANEL  LIPASE, BLOOD  TROPONIN I (HIGH SENSITIVITY)  TROPONIN I (HIGH SENSITIVITY)    EKG EKG Interpretation Date/Time:  Saturday July 16 2023 15:39:38 EDT Ventricular Rate:  54 PR Interval:  215 QRS Duration:  117 QT Interval:  459 QTC Calculation: 435 R Axis:   78  Text Interpretation: Sinus rhythm Prolonged PR interval Nonspecific intraventricular conduction delay Low voltage, precordial leads Consider inferior infarct Abnormal T, consider ischemia, lateral leads No significant change since last tracing Confirmed by Annita Kindle [phone]) on 07/16/2023 3:43:25 PM  Radiology DG Chest Port 1 View Result Date: 07/16/2023 CLINICAL DATA:  Chest pain since this morning. EXAM: PORTABLE CHEST 1 VIEW COMPARISON:  06/22/2022 FINDINGS: Poor inspiration. The cardiac silhouette remains mildly enlarged. Clear lungs with normal vascularity. Post CABG changes. Thoracic spine degenerative changes. IMPRESSION: No acute abnormality. Electronically Signed   By: Catherin Closs M.D.   On: 07/16/2023 17:15    Procedures Procedures    Medications Ordered in ED Medications  pantoprazole  (PROTONIX ) injection 40 mg (40 mg Intravenous Given 07/16/23 1653)  alum & mag  hydroxide-simeth (MAALOX/MYLANTA) 200-200-20 MG/5ML suspension 30 mL (30 mLs Oral Given 07/16/23 1747)    And  lidocaine  (XYLOCAINE ) 2 % viscous mouth solution 15 mL (15 mLs Oral Given 07/16/23 1747)    ED Course/ Medical Decision Making/ A&P                                 Medical Decision Making Amount and/or Complexity of Data Reviewed Labs: ordered. Radiology: ordered.  Risk OTC drugs. Prescription drug management.   Olan Bering 58 y.o. presented today for chest pain. Working DDx that I  considered at this time includes, but not limited to, ACS, pulmonary embolism, community-acquired pneumonia, aortic dissection, pneumothorax, underlying bony abnormality, anemia, thyrotoxicosis, HTN urgency/emergency, esophageal rupture, CHF exacerbation, valvular disorder, myocarditis, pericarditis, endocarditis, pericardial effusion/cardiac tamponade, pulmonary edema, gastritis/PUD/GERD, esophagitis, MSK.  R/o Dx: ACS, pulmonary embolism, community-acquired pneumonia, aortic dissection, pneumothorax, underlying bony abnormality, anemia, thyrotoxicosis, HTN urgency/emergency, esophageal rupture, CHF exacerbation, valvular disorder, myocarditis, pericarditis, endocarditis, pericardial effusion/cardiac tamponade, pulmonary edema, MSK: These are considered less likely due to history of present illness and physical exam findings. Aortic Dissection: less likely based on the location, quality, onset, and severity of symptoms in this case. Patient also has a lack of underlying history of AD or TAA.   Review of prior external notes: Good  Unique Tests and My Interpretation:  EKG: Rate, rhythm, axis, intervals all examined and without medically relevant abnormality. ST segments without concerns for elevations Troponin: 10 CXR: No acute pathology CBC: Unremarkable BMP: Unremarkable Lipase: Unremarkable Hepatic function panel: Unremarkable  Social Determinants of Health: none  Discussion with  Independent Historian: None  Discussion of Management of Tests: None  Risk: Medium: prescription drug management  Risk Stratification Score: None  Plan: On exam patient was in no acute distress with stable vitals. Patient's physical was remarkable for epigastric tenderness without peritoneal signs suspicious of possible gastritis or pancreatitis. Labs and CXR will be ordered.  The cardiac monitor was ordered secondary to the patient's history of chest pain and to monitor the patient for dysrhythmia. The cardiac monitor revealed normal sinus as interpreted by me.  Will give Protonix .  Patient stable at this time.  Patient was reevaluated.  By my independent interpretation of the patient's cardiac monitor, the monitor shows normal sinus.  Patient exam shows no new changes.  Patient states he feels better after Protonix  and so we will try GI cocktail as I do suspect his symptoms are gastritis related.  Still waiting on the lipase and hepatic function panel however first troponin was negative but will need delta as we are within the first 4 hours.  Labs ultimately reassuring.  After GI cocktail patient states he is 100% better and asymptomatic and is okay with being discharged and following up outpatient.  I spoke to the patient about how he most likely has acid reflux causing his symptoms and will prescribe him Protonix .  We discussed dieting along with lifestyle modifications to which patient verbalized understanding and acceptance of.  Patient was given return precautions. Patient stable for discharge at this time.  Patient verbalized understanding of plan.  This chart was dictated using voice recognition software.  Despite best efforts to proofread,  errors can occur which can change the documentation meaning.        Final Clinical Impression(s) / ED Diagnoses Final diagnoses:  Gastroesophageal reflux disease, unspecified whether esophagitis present    Rx / DC Orders ED Discharge  Orders          Ordered    pantoprazole  (PROTONIX ) 20 MG tablet  Daily        07/16/23 1904              Elex Grimmer 07/16/23 1908    Merdis Stalling, MD 07/17/23 1201

## 2023-07-16 NOTE — ED Notes (Signed)
 Patient dc'ed home at this time no acute distress noted vss patient a/o no complaints offered no c/o CP/SOB reviewed dc instructions follow up and medication verbalized understanding iv dc'ed encouraged to return in s/s persist or become bothersome. Patient ambulates steady without assist.

## 2023-07-16 NOTE — ED Triage Notes (Signed)
 Pt BIB [facility] from home d/t central/sharp CP that started this morning around 1000. Pt denies radiation, EMS reports pain cannot be reproduced & Hx of HTN & x5 Cardiac Bypass. Did have one episode of vomiting this AM & feels very weak. SBP was 110 before 0.4 nitroglycerin  was given then afterwards SBP was 94. A/Ox4, SB at 50 bpm on their monitor, 18g Lt AC.

## 2023-07-16 NOTE — Discharge Instructions (Addendum)
 Please follow-up with your primary care provider in regards to recent symptoms and ER visit.  Today your symptoms are most likely related to acid reflux and I have attached information below for you to review.  I have prescribed Protonix  to help with your symptoms.  Physiology: Gastroesophageal reflux disease (GERD) occurs when stomach acid frequently flows back into the esophagus, irritating its lining. This is often due to transient relaxations of the lower esophageal sphincter, which normally acts as a barrier to prevent reflux.  Follow-up: Patients should follow up with their primary care physician or gastroenterologist within 4-6 weeks to assess symptom control and medication efficacy. If symptoms persist despite treatment, further evaluation with endoscopy or pH monitoring may be necessary.  Medications: Proton pump inhibitors (PPIs) are the first-line treatment for GERD, recommended to be taken 30-60 minutes before a meal for optimal effect. Long-term use should be periodically evaluated to ensure the lowest effective dose is used. Histamine-2-receptor antagonists (H2RAs) may be considered for maintenance therapy in some cases.  Diet: Patients are advised to avoid eating within 3 hours of bedtime, reduce intake of alcohol, caffeine, and carbonated beverages, and identify and avoid specific dietary triggers such as spicy foods, chocolate, and high-fat meals. Weight loss and elevating the head of the bed can also help alleviate symptoms.  Return Precautions: Patients should seek emergency care if they experience alarm symptoms such as dysphagia, odynophagia, significant weight loss, gastrointestinal bleeding, or persistent vomiting, as these may indicate complications requiring immediate attention.

## 2023-08-12 ENCOUNTER — Other Ambulatory Visit (HOSPITAL_COMMUNITY): Payer: Self-pay | Admitting: Urology

## 2023-08-12 DIAGNOSIS — C61 Malignant neoplasm of prostate: Secondary | ICD-10-CM

## 2023-08-23 ENCOUNTER — Ambulatory Visit (HOSPITAL_COMMUNITY)
Admission: RE | Admit: 2023-08-23 | Discharge: 2023-08-23 | Disposition: A | Discharge status: Home / Self Care | Source: Ambulatory Visit | Attending: Urology | Admitting: Urology

## 2023-08-23 DIAGNOSIS — C61 Malignant neoplasm of prostate: Secondary | ICD-10-CM

## 2023-08-23 MED ORDER — GADOBUTROL 1 MMOL/ML IV SOLN
10.0000 mL | Freq: Once | INTRAVENOUS | Status: AC | PRN
Start: 2023-08-23 — End: 2023-08-23
  Administered 2023-08-23: 10 mL via INTRAVENOUS

## 2023-08-25 ENCOUNTER — Other Ambulatory Visit: Payer: Self-pay | Admitting: Student

## 2023-08-25 ENCOUNTER — Other Ambulatory Visit: Payer: Self-pay | Admitting: Cardiology

## 2023-08-25 DIAGNOSIS — I10 Essential (primary) hypertension: Secondary | ICD-10-CM

## 2023-08-30 ENCOUNTER — Telehealth: Payer: Self-pay | Admitting: Cardiology

## 2023-08-30 DIAGNOSIS — I10 Essential (primary) hypertension: Secondary | ICD-10-CM

## 2023-08-30 MED ORDER — ISOSORBIDE MONONITRATE ER 30 MG PO TB24: 30.0000 mg | ORAL_TABLET | Freq: Every day | ORAL | 0 refills | Status: DC

## 2023-08-30 MED ORDER — AMLODIPINE BESYLATE 5 MG PO TABS: 5.0000 mg | ORAL_TABLET | Freq: Every day | ORAL | 0 refills | Status: DC

## 2023-08-30 MED ORDER — ATORVASTATIN CALCIUM 80 MG PO TABS: 80.0000 mg | ORAL_TABLET | Freq: Every day | ORAL | 0 refills | Status: DC

## 2023-08-30 NOTE — Telephone Encounter (Signed)
*  STAT* If patient is at the pharmacy, call can be transferred to refill team.   1. Which medications need to be refilled? (please list name of each medication and dose if known) isosorbide  mononitrate (IMDUR ) 30 MG 24 hr tablet  amLODipine  (NORVASC ) 5 MG tablet  atorvastatin  (LIPITOR) 80 MG tablet    2. Would you like to learn more about the convenience, safety, & potential cost savings by using the [facility]?      3. Are you open to using the [facility] (Type [facility].  ).   4. Which pharmacy/location (including street and city if local pharmacy) is medication to be sent to? [id number] - [place], [place] - [address]    5. Do they need a 30 day or 90 day supply? 90 day

## 2023-08-30 NOTE — Telephone Encounter (Signed)
 Pt's medications were sent to pt's pharmacy as requested. Confirmation received.

## 2023-09-13 ENCOUNTER — Other Ambulatory Visit: Payer: Self-pay | Admitting: Urology

## 2023-09-14 ENCOUNTER — Telehealth: Payer: Self-pay | Admitting: Cardiology

## 2023-09-14 NOTE — Telephone Encounter (Signed)
 Patient is current scheduled to see Aline Door on 7/29, cardiac clearance can be addressed at the time of visit

## 2023-09-14 NOTE — Telephone Encounter (Signed)
   Pre-operative Risk Assessment    Patient Name: Glenn Stewart  DOB: 1965-06-14 MRN: [id number]   Date of last office visit: 09/22/22 Date of next office visit: 10/18/23   Request for Surgical Clearance    Procedure:  Radical prostatectomy and bilateral pelvic lymph node dissection  Date of Surgery:  Clearance 11/17/23                                Surgeon:  Dr. Morene Salines Surgeon's Group or Practice Name:  [facility] Phone number:  [phone]  Fax number:  [phone]   Type of Clearance Requested:   - Medical  - Pharmacy:  Hold Aspirin      Type of Anesthesia:  General    Additional requests/questions:  Caller [facility]) stated they request patient's Aspirin  be held 5 days before procedure.  Signed, Jasmin B Wilson   09/14/2023, 8:56 AM

## 2023-09-14 NOTE — Telephone Encounter (Signed)
 I will update all parties the pt has appt 10/18/23 with Callie Goodrich, PAC, per preop APP defer clearance to be addressed at 10/18/23.

## 2023-10-08 NOTE — Progress Notes (Signed)
 Cardiology Office Note:    Date:  10/18/2023   ID:  Glenn Stewart, DOB May 31, 1965, MRN [id number]  PCP:  Pcp, No  Cardiologist:  Redell Shallow, MD     Referring MD: No ref. provider found   Chief Complaint: follow-up of CAD  History of Present Illness:    Glenn Stewart is a 58 y.o. male with a history of of CAD s/p recent CABG x5 (LIMA-LAD, left radial-OM1, SVG-D1, sequential SVG-OM-PLA) in 04/2022, post-op atrial fibrillation (no longer on anticoagulation), mild bilateral carotid stenosis, hypertension, hyperlipidemia, ADHD, and bipolar disorder who is followed by Dr. Shallow and presents today for routine follow-up and pre-op evaluation.   Patient has a history of chest pain and has been worked up for this in the past. He had a negative stress Echo in 2010. In 08/2017, coronary calcium  score came back elevated at 2,076 which put him in the 99th percentile for age and sex. At that time, coronary angiogram was recommended but patient was concerned about cost. However, ETT aroudn this time was negative. He was seen in the ED in 02/2020 with left sided chest pain. Work-up included EKG, troponin, and D-dimer were unremarkable. He was placed on Imdur  and did well for a while. He was admitted in 04/2022 after presenting with a syncopal episode and intermittent chest pain. High-sensitivity troponin negative. Echo showed LVEF of 50-55% with inferior basal hypokinesis. LHC showed severe three vessel CAD including CTO of LAD. CT surgery was consulted and patient underwent CABG x5 with LIMA to LAD, left radial to OM1, SVG to D1, and sequential SVG to OM and PLA with Dr. Kerrin on 05/12/2022. He had some post-op atrial fibrillation and was started on Amiodarone  and Eliquis .  Repeat monitor in 06/2022 showed no recurrent atrial fibrillation.  He was last seen by me in 09/2022 at which time he was doing great with no cardiac complaints. Amiodarone  and Eliquis  were both stopped after discussion with Dr.  Shallow given atrial fibrillation occurred in post-op and there was no documented recurrence on monitor.   He was seen in the ED in 06/2023 for chest pain. EKG showed T wave inversions in lateral leads but no significant changes compared to prior tracings. High-sensitivity troponin negative x2. He had significant epigastric tenderness on exam and symptoms resolved with Protonix  and GI cocktail. Symptoms were felt to be due to GERD and he was discharged on Protonix .   Patient presents today for follow-up. He states the episode of chest pain in 06/2023 that led to ED visit was an isolated event and he thinks it was due to something he ate the night before. It does sound like this was due to GERD. He denies any chest pain prior to the ED visit and none since. No shortness of breath, orthopnea, PND, edema, palpitations, lightheadedness/ dizziness, or syncope. He has gained almost 20 lbs since last year which he attributes to his depression and diet/ appetite. No signs of fluid overload. He stays active and is easily able to compete >4.0 METS. He states he can speed walk for 2 miles without any symptoms.    EKGs/Labs/Other Studies Reviewed:    The following studies were reviewed:  Exercise Tolerance Test 08/30/2017: The patient walked for 7 minutes and 57 seconds of a standard Bruce protocol treadmill test. He achieved a peak heart rate of 144 which is 86% predicted maximal heart rate. At peak exercise there were no ST or T wave changes. Blood pressure response to exercise was  normal. This is interpreted as a negative stress test. _______________   CT Cardiac Score 09/01/2017: Coronary calcium  score of 2076. This was 38 percentile for age and sex matched control. _______________   Echocardiogram 05/10/2022: Impressions: 1. Inferior basal hypokinesis . Left ventricular ejection fraction, by  estimation, is 50 to 55%. The left ventricle has low normal function. The  left ventricle demonstrates regional  wall motion abnormalities (see  scoring diagram/findings for  description). Left ventricular diastolic parameters were normal.   2. Right ventricular systolic function is normal. The right ventricular  size is normal.   3. Left atrial size was mildly dilated.   4. The mitral valve is abnormal. Trivial mitral valve regurgitation. No  evidence of mitral stenosis.   5. The aortic valve is tricuspid. There is mild calcification of the  aortic valve. Aortic valve regurgitation is not visualized. Aortic valve  sclerosis is present, with no evidence of aortic valve stenosis.   6. The inferior vena cava is normal in size with greater than 50%  respiratory variability, suggesting right atrial pressure of 3 mmHg.  _______________   Carotid Ultrasound 05/10/2022: Impressions: - Right Carotid: Velocities in the right ICA are consistent with a 1-39% stenosis.  - Left Carotid: Velocities in the left ICA are consistent with a 1-39% stenosis.  - Vertebrals: Bilateral vertebral arteries demonstrate antegrade flow.  - Subclavians: Normal flow hemodynamics were seen in bilateral subclavian arteries.  _______________   Cardiac Catheterization 05/10/2022:   Prox RCA to Mid RCA lesion is 100% stenosed.   1st Mrg lesion is 99% stenosed.   Mid Cx to Dist Cx lesion is 99% stenosed.   Ost LAD to Prox LAD lesion is 70% stenosed.   Mid LAD lesion is 100% stenosed.   1st Diag lesion is 99% stenosed.   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 Severe disease in a moderate caliber Diagonal branch that arises prior to the total LAD occlusion.  Large caliber Circumflex with diffuse severe distal stenosis leading into the most distal obtuse marginal branch. (Functional CTO). The first obtuse marginal branch is a moderate caliber vessel with severe ostial stenosis.  The RCA is a large dominant artery with chronic total occlusion of the  mid vessel The distal vessel fills from right to right collaterals and left to right collaterals.    Recommendations; He has severe three vessel CAD with chronic occlusion of the mid RCA, mid LAD and distal Circumflex with severe disease in the early obtuse marginal branch and in the diagonal branch. Bypass surgery appears to be the best method for revascularization. Will ask CT surgery to see him today to discuss bypass. Will resume IV heparin  2 hours post sheath pull. Continue ASA, statin and beta blocker.    Diagnostic Dominance: Right      Event Monitor 07/02/2022 to 07/31/2022: Sinus bradycardia, normal sinus rhythm, sinus tachycardia, 4 beats nonsustained tachycardia.   EKG:  EKG ordered today. EKG personally reviewed and demonstrates normal sinus rhythm, rate 76 bpm, with T wave inversion in inferior lead and lateral leads. T waves in lateral leads are not new. T waves in inferior leads were previously flat but overall no significant changes.   Recent Labs: 07/16/2023: ALT 41; BUN 17; Creatinine, Ser 1.01; Hemoglobin 14.6; Platelets 226; Potassium 3.6; Sodium 139  Recent Lipid Panel    Component Value Date/Time   CHOL 182 05/10/2022 0505   TRIG  180 (H) 05/10/2022 0505   HDL 38 (L) 05/10/2022 0505   CHOLHDL 4.8 05/10/2022 0505   VLDL 36 05/10/2022 0505   LDLCALC 108 (H) 05/10/2022 0505   LDLDIRECT 117.5 04/10/2012 1012    Physical Exam:    Vital Signs: BP 118/70 (BP Location: Left Arm)   Pulse 76   Ht 5' 7 (1.702 m)   Wt 234 lb (106.1 kg)   SpO2 95%   BMI 36.65 kg/m     Wt Readings from Last 3 Encounters:  10/18/23 234 lb (106.1 kg)  07/16/23 230 lb (104.3 kg)  09/22/22 215 lb 3.2 oz (97.6 kg)     General: 58 y.o. Caucasian male in no acute distress. HEENT: Normocephalic and atraumatic. Sclera clear.  Neck: Supple. No carotid bruits. No JVD. Heart: RRR. Distinct S1 and S2. No murmurs, gallops, or rubs.  Lungs: No increased work of breathing. Clear to ausculation  bilaterally. No wheezes, rhonchi, or rales.  Extremities: No lower extremity edema.  Skin: Warm and dry. Neuro: No focal deficits. Psych: Normal affect. Responds appropriately.   Assessment:    1. Coronary artery disease involving native coronary artery of native heart without angina pectoris   2. S/P CABG x 5   3. Post-op atrial fibrillation   4. Primary hypertension   5. Hyperlipidemia, unspecified hyperlipidemia type   6. Preoperative cardiovascular examination     Plan:    CAD s/p CABG x5 Patient was admitted in 04/2022 for syncope and intermittent chest pain. LHC showed 3 vessel CAD and she underwent CABG x5. He was seen in the ED in 06/2023 for chest pain. EKG at that time showed no acute ischemic changes and high-sensitivity troponin negative x2. Symptoms resolved with GI cocktail and Protonix . Felt to be secondary to GERD. - No recurrent chest pain. - Continue Amlodipine  5mg  daily, Imdur  30mg  daily, and Lopressor  25mg  twice daily.  - Continue aspirin  and high-intensity statin. - Episode in 06/2023 was an isolated event and does sound secondary to GERD. Does not sound like angina. No additional ischemic work-up necessary at this time.    Post-Op Atrial Fibrillation Following CABG in 04/2022. He was  started on Amiodarone  and Eliquis  at that time but both were subsequently discontinued after repeat outpatient monitor in 06/2022 showed no recurrent atrial fibrillation.   - Maintaining sinus rhythm.  - Continue Lopressor  25mg  twice daily.  - CHA2DS2-VASc = 2 (CAD, HTN). No longer on anticoagulation given no recurrent atrial fibrillation as above. However, if he has an recurrent documented atrial fibrillation, would recommend restarting Eliquis .   Hypertension BP well controlled.  - Continue current medications: Amlodipine  5mg  daily, Imdur  30mg  daily, and Lopressor  25mg  twice daily.    Hyperlipidemia Lipid panel in 04/2022:  Total Cholesterol 182, Triglycerides 180, HDL 38, LDL 108.  LDL goal <70 given CAD.  - He was switched from Simvastatin  to Lipitor 80mg  daily during admission in 04/2022. Continue. - He is overdue for labs but is not fasting today. He will come back for fasting lipid panel and LFTs tomorrow.  Pre-Op Evaluation Patient had radial prostatectomy and bilateral pelvic lymph node dissection scheduled for 11/17/2023. He is doing well from a cardiac standpoint. No angina, shortness of breath, acute CHF symptoms, palpitations, dizziness, or syncope. He is able to complete >4.0 METS of physical activity without any anginal symptoms. Revised Cardiac Risk Index = 1 (CAD) indicating a 1.1% of a major cardiac event perioperatively. Therefore, based on ACC/AHA guidelines, patient would be at acceptable risk  for the planned procedure without further cardiovascular testing. Recommend continuing beta-blocker (Lopressor ) perioperatively. Regarding Aspirin  therapy, recommend continuation of Aspirin  throughout the perioperative period if possible.  However, if the surgeon feels that the bleeding risk is too high while on Aspirin , okay to hold this for 5-7 days prior to surgery with a plan to resume it as soon as felt to be feasible from a surgical standpoint in the post-operative period. I will route this recommendation to the requesting party via Epic fax function.   Disposition: Follow up in 1 year.    Signed, Aline FORBES Door, PA-C  10/18/2023 8:59 PM    [facility]

## 2023-10-18 ENCOUNTER — Ambulatory Visit: Attending: Internal Medicine | Admitting: Student

## 2023-10-18 ENCOUNTER — Encounter: Payer: Self-pay | Admitting: Student

## 2023-10-18 VITALS — BP 118/70 | HR 76 | Ht 67.0 in | Wt 234.0 lb

## 2023-10-18 DIAGNOSIS — I480 Paroxysmal atrial fibrillation: Secondary | ICD-10-CM | POA: Insufficient documentation

## 2023-10-18 DIAGNOSIS — Z951 Presence of aortocoronary bypass graft: Secondary | ICD-10-CM | POA: Insufficient documentation

## 2023-10-18 DIAGNOSIS — Z01810 Encounter for preprocedural cardiovascular examination: Secondary | ICD-10-CM | POA: Diagnosis present

## 2023-10-18 DIAGNOSIS — I2510 Atherosclerotic heart disease of native coronary artery without angina pectoris: Secondary | ICD-10-CM | POA: Diagnosis not present

## 2023-10-18 DIAGNOSIS — E785 Hyperlipidemia, unspecified: Secondary | ICD-10-CM | POA: Diagnosis present

## 2023-10-18 DIAGNOSIS — I10 Essential (primary) hypertension: Secondary | ICD-10-CM | POA: Insufficient documentation

## 2023-10-18 NOTE — Patient Instructions (Signed)
 Medication Instructions:  No medication changes were made during today's visit.  *If you need a refill on your cardiac medications before your next appointment, please call your pharmacy*   Lab Work: Return for fasting lipid panel..................SABRA LIPID PANEL, LFT's If you have labs (blood work) drawn today and your tests are completely normal, you will receive your results only by: MyChart Message (if you have MyChart) OR A paper copy in the mail If you have any lab test that is abnormal or we need to change your treatment, we will call you to review the results.   Testing/Procedures: No procedures were ordered during today's visit.    Follow-Up: At [facility], you and your health needs are our priority.  As part of our continuing mission to provide you with exceptional heart care, we have created designated Provider Care Teams.  These Care Teams include your primary Cardiologist (physician) and Advanced Practice Providers (APPs -  Physician Assistants and Nurse Practitioners) who all work together to provide you with the care you need, when you need it.  We recommend signing up for the patient portal called MyChart.  Sign up information is provided on this After Visit Summary.  MyChart is used to connect with patients for Virtual Visits (Telemedicine).  Patients are able to view lab/test results, encounter notes, upcoming appointments, etc.  Non-urgent messages can be sent to your provider as well.   To learn more about what you can do with MyChart, go to [email or web address].    Your next appointment:   1 year(s)  Provider:   Callie Goodrich, PA-C          Other Instructions Thank you for choosing [facility]!

## 2023-10-18 NOTE — Progress Notes (Deleted)
  Cardiology Office Note:  .   Date:  10/18/2023  ID:  Glenn Stewart, DOB 02/16/1966, MRN [id number] Glenn Stewart  [facility] Providers Cardiologist:  Redell Shallow, MD {  History of Present Illness: .   Glenn Stewart is a 58 y.o. male with history of CAD status post recent CABG x 5 (LIMA-LAD, left radial-OM1, SVG-D1, sequential SVG-OM-PLA) in 04/2022, post-op atrial fibrillation (no longer on anticoagulation), mild bilateral carotid stenosis, hypertension, hyperlipidemia, ADHD, and bipolar disorder     CAD status post CABG Admitted 04/2022 with syncope and intermittent chest pain.  Echo with preserved LVEF but with inferior basal hypokinesis.  Left heart catheterization demonstrating severe three-vessel disease.  Underwent CABG x 5.   Postoperative atrial fibrillation Noted after his CABG, converted on amiodarone .  30-day heart monitor ordered 07/2022 no evidence of atrial fibrillation.  Eliquis  and amiodarone  stopped.   Social history      Patient with history of CAD status post CABG x 5 04/2022.  Last seen in office 09/2022 without any acute complaints.  Reported he was feeling much better than he did prior to his CABG and much more active.  Preoperative valuation for radical prostatectomy and bilateral pelvic lymph dissection Can hold aspirin  5 to 7 days prior to his procedure felt to be high risk for bleeding  Surgeon:  Dr. Morene Salines Surgeon's Group or Practice Name:  [facility] Phone number:  [phone]  Fax number:  [phone]  CAD status post CABG x 5  Hyperlipidemia   Postoperative atrial fibrillation After 30-day heart monitor demonstrated no atrial fibrillation.  Anticoagulation and amiodarone  were stopped.  CHA2DS2-VASc 2.  Hypertension     ROS: Denies: Chest pain, shortness of breath, orthopnea, peripheral edema, palpitations, decreased exercise intolerance, fatigue, lightheadedness.   Studies Reviewed: .         Risk  Assessment/Calculations:   {Does this patient have ATRIAL FIBRILLATION?:[phone]} No BP recorded.  {Refresh Note OR Click here to enter BP  :1}***       Physical Exam:   VS:  There were no vitals taken for this visit.   Wt Readings from Last 3 Encounters:  07/16/23 230 lb (104.3 kg)  09/22/22 215 lb 3.2 oz (97.6 kg)  06/22/22 207 lb (93.9 kg)    GEN: Well nourished, well developed in no acute distress NECK: No JVD; No carotid bruits CARDIAC: ***RRR, no murmurs, rubs, gallops RESPIRATORY:  Clear to auscultation without rales, wheezing or rhonchi  ABDOMEN: Soft, non-tender, non-distended EXTREMITIES:  No edema; No deformity   ASSESSMENT AND PLAN: .         {Are you ordering a CV Procedure (e.g. stress test, cath, DCCV, TEE, etc)?   Press F2        :[id number]}  Dispo: ***  Signed, Thom LITTIE Sluder, PA-C

## 2023-11-02 NOTE — Progress Notes (Signed)
 COVID Vaccine received:  []  No []  Yes Date of any COVID positive Test in last 90 days:  PCP -  Cardiologist - Redell Shallow MD  Chest x-ray - 07/16/23 Epic EKG -  10/18/23 Epic Stress Test -  ECHO - 05/12/22 Epic Cardiac Cath - 05/10/22 Epic  Cardiac Clearance 10/18/23- Aline Door PA-C  Bowel Prep - []  No  []   Yes ______  Pacemaker / ICD device []  No []  Yes   Spinal Cord Stimulator:[]  No []  Yes       History of Sleep Apnea? []  No []  Yes   CPAP used?- []  No []  Yes    Does the patient monitor blood sugar?          []  No []  Yes  []  N/A  Patient has: []  NO Hx DM   []  Pre-DM                 []  DM1  []   DM2 Does patient have a [employer] or Dexacom? []  No []  Yes   Fasting Blood Sugar Ranges-  Checks Blood Sugar _____ times a day  GLP1 agonist / usual dose -  GLP1 instructions:  SGLT-2 inhibitors / usual dose -  SGLT-2 instructions:   Blood Thinner / Instructions: Aspirin  Instructions:  Comments:   Activity level: Patient is able / unable to climb a flight of stairs without difficulty; []  No CP  []  No SOB, but would have ___   Patient can / can not perform ADLs without assistance.   Anesthesia review:   Patient denies shortness of breath, fever, cough and chest pain at PAT appointment.  Patient verbalized understanding and agreement to the Pre-Surgical Instructions that were given to them at this PAT appointment. Patient was also educated of the need to review these PAT instructions again prior to his/her surgery.I reviewed the appropriate phone numbers to call if they have any and questions or concerns.

## 2023-11-02 NOTE — Patient Instructions (Signed)
 SURGICAL WAITING ROOM VISITATION  Patients having surgery or a procedure may have no more than 2 support people in the waiting area - these visitors may rotate.    Children under the age of 54 must have an adult with them who is not the patient.  Visitors with respiratory illnesses are discouraged from visiting and should remain at home.  If the patient needs to stay at the hospital during part of their recovery, the visitor guidelines for inpatient rooms apply. Pre-op nurse will coordinate an appropriate time for 1 support person to accompany patient in pre-op.  This support person may not rotate.    Please refer to the [facility] website for the visitor guidelines for Inpatients (after your surgery is over and you are in a regular room).       Your procedure is scheduled on: 11/17/23   Report to [facility] Main Entrance    Report to admitting at 5:15 AM   Call this number if you have problems the morning of surgery [phone]   Do not eat food :After Midnight.    FOLLOW BOWEL PREP AND ANY ADDITIONAL PRE OP INSTRUCTIONS YOU RECEIVED FROM YOUR SURGEON'S OFFICE!!!     Oral Hygiene is also important to reduce your risk of infection.                                    Remember - BRUSH YOUR TEETH THE MORNING OF SURGERY WITH YOUR REGULAR TOOTHPASTE  DENTURES WILL BE REMOVED PRIOR TO SURGERY PLEASE DO NOT APPLY Poly grip OR ADHESIVES!!!   Stop all vitamins and herbal supplements 7 days before surgery.   Take these medicines the morning of surgery with A SIP OF WATER: TYLENOL , Alprazolam (Xanax ), Amlodipine , Atorvastatin , Isosorbide (imdur ), Metoprolol , Oxycodone , Sertraline (zoloft ), Tamsulosin              You may not have any metal on your body including hair pins, jewelry, and body piercing             Do not wear make-up, lotions, powders, perfumes/cologne, or deodorant              Men may shave face and neck.   Do not bring valuables to the hospital. [facility]  IS NOT             RESPONSIBLE   FOR VALUABLES.   Contacts, glasses, dentures or bridgework may not be worn into surgery.   Bring small overnight bag day of surgery.   DO NOT BRING YOUR HOME MEDICATIONS TO THE HOSPITAL. PHARMACY WILL DISPENSE MEDICATIONS LISTED ON YOUR MEDICATION LIST TO YOU DURING YOUR ADMISSION IN THE HOSPITAL!    Patients discharged on the day of surgery will not be allowed to drive home.  Someone NEEDS to stay with you for the first 24 hours after anesthesia.   Special Instructions: Bring a copy of your healthcare power of attorney and living will documents the day of surgery if you haven't scanned them before.              Please read over the following fact sheets you were given: IF YOU HAVE QUESTIONS ABOUT YOUR PRE-OP INSTRUCTIONS PLEASE CALL [phone] [facility]   If you received a COVID test during your pre-op visit  it is requested that you wear a mask when out in public, stay away from anyone that may not be feeling well and notify your surgeon if  you develop symptoms. If you test positive for Covid or have been in contact with anyone that has tested positive in the last 10 days please notify you surgeon.    [facility] - Preparing for Surgery Before surgery, you can play an important role.  Because skin is not sterile, your skin needs to be as free of germs as possible.  You can reduce the number of germs on your skin by washing with CHG (chlorahexidine gluconate) soap before surgery.  CHG is an antiseptic cleaner which kills germs and bonds with the skin to continue killing germs even after washing. Please DO NOT use if you have an allergy to CHG or antibacterial soaps.  If your skin becomes reddened/irritated stop using the CHG and inform your nurse when you arrive at Short Stay. Do not shave (including legs and underarms) for at least 48 hours prior to the first CHG shower.  You may shave your face/neck.  Please follow these instructions carefully:  1.  Shower with  CHG Soap the night before surgery and the  morning of surgery.  2.  If you choose to wash your hair, wash your hair first as usual with your normal  shampoo.  3.  After you shampoo, rinse your hair and body thoroughly to remove the shampoo.                             4.  Use CHG as you would any other liquid soap.  You can apply chg directly to the skin and wash.  Gently with a scrungie or clean washcloth.  5.  Apply the CHG Soap to your body ONLY FROM THE NECK DOWN.   Do   not use on face/ open                           Wound or open sores. Avoid contact with eyes, ears mouth and   genitals (private parts).                       Wash face,  Genitals (private parts) with your normal soap.             6.  Wash thoroughly, paying special attention to the area where your    surgery  will be performed.  7.  Thoroughly rinse your body with warm water from the neck down.  8.  DO NOT shower/wash with your normal soap after using and rinsing off the CHG Soap.                9.  Pat yourself dry with a clean towel.            10.  Wear clean pajamas.            11.  Place clean sheets on your bed the night of your first shower and do not  sleep with pets. Day of Surgery : Do not apply any lotions/deodorants the morning of surgery.  Please wear clean clothes to the hospital/surgery center.  FAILURE TO FOLLOW THESE INSTRUCTIONS MAY RESULT IN THE CANCELLATION OF YOUR SURGERY   ________________________________________________________________________ WHAT IS A BLOOD TRANSFUSION? Blood Transfusion Information  A transfusion is the replacement of blood or some of its parts. Blood is made up of multiple cells which provide different functions. Red blood cells carry oxygen and are used for blood loss replacement. White  blood cells fight against infection. Platelets control bleeding. Plasma helps clot blood. Other blood products are available for specialized needs, such as hemophilia or other clotting  disorders. BEFORE THE TRANSFUSION  Who gives blood for transfusions?  Healthy volunteers who are fully evaluated to make sure their blood is safe. This is blood bank blood. Transfusion therapy is the safest it has ever been in the practice of medicine. Before blood is taken from a donor, a complete history is taken to make sure that person has no history of diseases nor engages in risky social behavior (examples are intravenous drug use or sexual activity with multiple partners). The donor's travel history is screened to minimize risk of transmitting infections, such as malaria. The donated blood is tested for signs of infectious diseases, such as HIV and hepatitis. The blood is then tested to be sure it is compatible with you in order to minimize the chance of a transfusion reaction. If you or a relative donates blood, this is often done in anticipation of surgery and is not appropriate for emergency situations. It takes many days to process the donated blood. RISKS AND COMPLICATIONS Although transfusion therapy is very safe and saves many lives, the main dangers of transfusion include:  Getting an infectious disease. Developing a transfusion reaction. This is an allergic reaction to something in the blood you were given. Every precaution is taken to prevent this. The decision to have a blood transfusion has been considered carefully by your caregiver before blood is given. Blood is not given unless the benefits outweigh the risks. AFTER THE TRANSFUSION Right after receiving a blood transfusion, you will usually feel much better and more energetic. This is especially true if your red blood cells have gotten low (anemic). The transfusion raises the level of the red blood cells which carry oxygen, and this usually causes an energy increase. The nurse administering the transfusion will monitor you carefully for complications. HOME CARE INSTRUCTIONS  No special instructions are needed after a transfusion.  You may find your energy is better. Speak with your caregiver about any limitations on activity for underlying diseases you may have. SEEK MEDICAL CARE IF:  Your condition is not improving after your transfusion. You develop redness or irritation at the intravenous (IV) site. SEEK IMMEDIATE MEDICAL CARE IF:  Any of the following symptoms occur over the next 12 hours: Shaking chills. You have a temperature by mouth above 102 F (38.9 C), not controlled by medicine. Chest, back, or muscle pain. People around you feel you are not acting correctly or are confused. Shortness of breath or difficulty breathing. Dizziness and fainting. You get a rash or develop hives. You have a decrease in urine output. Your urine turns a dark color or changes to pink, red, or brown. Any of the following symptoms occur over the next 10 days: You have a temperature by mouth above 102 F (38.9 C), not controlled by medicine. Shortness of breath. Weakness after normal activity. The white part of the eye turns yellow (jaundice). You have a decrease in the amount of urine or are urinating less often. Your urine turns a dark color or changes to pink, red, or brown. Document Released: 03/05/2000 Document Revised: 05/31/2011 Document Reviewed: 10/23/2007 [facility] Patient Information 2014 [place], [place].

## 2023-11-07 ENCOUNTER — Encounter (HOSPITAL_COMMUNITY)
Admission: RE | Admit: 2023-11-07 | Discharge: 2023-11-07 | Disposition: A | Discharge status: Home / Self Care | Source: Ambulatory Visit

## 2023-11-10 NOTE — Patient Instructions (Signed)
 SURGICAL WAITING ROOM VISITATION  Patients having surgery or a procedure may have no more than 2 support people in the waiting area - these visitors may rotate.    Children under the age of 18 must have an adult with them who is not the patient.  Visitors with respiratory illnesses are discouraged from visiting and should remain at home.  If the patient needs to stay at the hospital during part of their recovery, the visitor guidelines for inpatient rooms apply. Pre-op nurse will coordinate an appropriate time for 1 support person to accompany patient in pre-op.  This support person may not rotate.    Please refer to the [facility] website for the visitor guidelines for Inpatients (after your surgery is over and you are in a regular room).       Your procedure is scheduled on: 11/17/23   [facility]    Report to admitting at : 05:15AM   Call this number if you have problems the morning of surgery [phone]   Clear liquids diet starting the day before surgery until midnight  Water Non-Citrus Juices (without pulp, NO RED-Apple, White grape, White cranberry) Black Coffee (NO MILK/CREAM OR CREAMERS, sugar ok)  Clear Tea (NO MILK/CREAM OR CREAMERS, sugar ok) regular and decaf                             Plain Jell-O (NO RED)                                           Fruit ices (not with fruit pulp, NO RED)                                     Popsicles (NO RED)                                                               Sports drinks like Gatorade (NO RED)              FOLLOW BOWEL PREP AND ANY ADDITIONAL PRE OP INSTRUCTIONS YOU RECEIVED FROM YOUR SURGEON'S OFFICE!!!   Oral Hygiene is also important to reduce your risk of infection.                                    Remember - BRUSH YOUR TEETH THE MORNING OF SURGERY WITH YOUR REGULAR TOOTHPASTE  DENTURES WILL BE REMOVED PRIOR TO SURGERY PLEASE DO NOT APPLY Poly grip OR ADHESIVES!!!   Do NOT  smoke after Midnight   Stop all vitamins and herbal supplements 7 days before surgery.   Take these medicines the morning of surgery with A SIP OF WATER: amlodipine ,atorvastatin ,isosorbide (imdur ),metoprolol ,sertraline .Tylenol ,alprazolam ,oxycodone  as needed.  DO NOT TAKE ANY ORAL DIABETIC MEDICATIONS DAY OF YOUR SURGERY  Bring CPAP mask and tubing day of surgery.  You may not have any metal on your body including hair pins, jewelry, and body piercing             Do not wear lotions, powders, perfumes/cologne, or deodorant              Men may shave face and neck.   Do not bring valuables to the hospital. [facility] IS NOT             RESPONSIBLE   FOR VALUABLES.   Contacts, glasses, dentures or bridgework may not be worn into surgery.   Bring small overnight bag day of surgery.   DO NOT BRING YOUR HOME MEDICATIONS TO THE HOSPITAL. PHARMACY WILL DISPENSE MEDICATIONS LISTED ON YOUR MEDICATION LIST TO YOU DURING YOUR ADMISSION IN THE HOSPITAL!    Patients discharged on the day of surgery will not be allowed to drive home.  Someone NEEDS to stay with you for the first 24 hours after anesthesia.   Special Instructions: Bring a copy of your healthcare power of attorney and living will documents the day of surgery if you haven't scanned them before.              Please read over the following fact sheets you were given: IF YOU HAVE QUESTIONS ABOUT YOUR PRE-OP INSTRUCTIONS PLEASE CALL [phone].   If you received a COVID test during your pre-op visit  it is requested that you wear a mask when out in public, stay away from anyone that may not be feeling well and notify your surgeon if you develop symptoms. If you test positive for Covid or have been in contact with anyone that has tested positive in the last 10 days please notify you surgeon.     WHAT IS A BLOOD TRANSFUSION? Blood Transfusion Information  A transfusion is the replacement of blood or some of its  parts. Blood is made up of multiple cells which provide different functions. Red blood cells carry oxygen and are used for blood loss replacement. White blood cells fight against infection. Platelets control bleeding. Plasma helps clot blood. Other blood products are available for specialized needs, such as hemophilia or other clotting disorders. BEFORE THE TRANSFUSION  Who gives blood for transfusions?  Healthy volunteers who are fully evaluated to make sure their blood is safe. This is blood bank blood. Transfusion therapy is the safest it has ever been in the practice of medicine. Before blood is taken from a donor, a complete history is taken to make sure that person has no history of diseases nor engages in risky social behavior (examples are intravenous drug use or sexual activity with multiple partners). The donor's travel history is screened to minimize risk of transmitting infections, such as malaria. The donated blood is tested for signs of infectious diseases, such as HIV and hepatitis. The blood is then tested to be sure it is compatible with you in order to minimize the chance of a transfusion reaction. If you or a relative donates blood, this is often done in anticipation of surgery and is not appropriate for emergency situations. It takes many days to process the donated blood. RISKS AND COMPLICATIONS Although transfusion therapy is very safe and saves many lives, the main dangers of transfusion include:  Getting an infectious disease. Developing a transfusion reaction. This is an allergic reaction to something in the blood you were given. Every precaution is taken to prevent this. The decision to have a blood transfusion has been considered carefully by your caregiver  before blood is given. Blood is not given unless the benefits outweigh the risks. AFTER THE TRANSFUSION Right after receiving a blood transfusion, you will usually feel much better and more energetic. This is especially  true if your red blood cells have gotten low (anemic). The transfusion raises the level of the red blood cells which carry oxygen, and this usually causes an energy increase. The nurse administering the transfusion will monitor you carefully for complications. HOME CARE INSTRUCTIONS  No special instructions are needed after a transfusion. You may find your energy is better. Speak with your caregiver about any limitations on activity for underlying diseases you may have. SEEK MEDICAL CARE IF:  Your condition is not improving after your transfusion. You develop redness or irritation at the intravenous (IV) site. SEEK IMMEDIATE MEDICAL CARE IF:  Any of the following symptoms occur over the next 12 hours: Shaking chills. You have a temperature by mouth above 102 F (38.9 C), not controlled by medicine. Chest, back, or muscle pain. People around you feel you are not acting correctly or are confused. Shortness of breath or difficulty breathing. Dizziness and fainting. You get a rash or develop hives. You have a decrease in urine output. Your urine turns a dark color or changes to pink, red, or brown. Any of the following symptoms occur over the next 10 days: You have a temperature by mouth above 102 F (38.9 C), not controlled by medicine. Shortness of breath. Weakness after normal activity. The white part of the eye turns yellow (jaundice). You have a decrease in the amount of urine or are urinating less often. Your urine turns a dark color or changes to pink, red, or brown. Document Released: 03/05/2000 Document Revised: 05/31/2011 Document Reviewed: 10/23/2007 ExitCare Patient Information 2014 ExitCare, [place].  _______________________________________________________________________ SURGICAL WAITING ROOM VISITATION Patients having surgery or a procedure may have no more than 2 support people in the waiting area - these visitors may rotate in the visitor waiting room.   Due to an increase  in RSV and influenza rates and associated hospitalizations, children ages 68 and under may not visit patients in [facility] hospitals. If the patient needs to stay at the hospital during part of their recovery, the visitor guidelines for inpatient rooms apply.  PRE-OP VISITATION  Pre-op nurse will coordinate an appropriate time for 1 support person to accompany the patient in pre-op.  This support person may not rotate.  This visitor will be contacted when the time is appropriate for the visitor to come back in the pre-op area.  Please refer to the [facility] website for the visitor guidelines for Inpatients (after your surgery is over and you are in a regular room).  You are not required to quarantine at this time prior to your surgery. However, you must do this: Hand Hygiene often Do NOT share personal items Notify your provider if you are in close contact with someone who has COVID or you develop fever 100.4 or greater, new onset of sneezing, cough, sore throat, shortness of breath or body aches.  If you test positive for Covid or have been in contact with anyone that has tested positive in the last 10 days please notify you surgeon.    Your procedure is scheduled on:    Report to [facility] Main Entrance: [place] entrance where the [employer] is available.   Report to admitting at:     AM  Call this number if you have any questions or problems the  morning of surgery [phone]  FOLLOW ANY ADDITIONAL PRE OP INSTRUCTIONS YOU RECEIVED FROM YOUR SURGEON'S OFFICE!!!  Do not eat food after Midnight the night prior to your surgery/procedure.  After Midnight you may have the following liquids until         AM / PM DAY OF SURGERY  Clear Liquid Diet Water Black Coffee (sugar ok, NO MILK/CREAM OR CREAMERS)  Tea (sugar ok, NO MILK/CREAM OR CREAMERS) regular and decaf                             Plain Jell-O  with no fruit (NO RED)                                            Fruit ices (not with fruit pulp, NO RED)                                     Popsicles (NO RED)                                                                  Juice: NO CITRUS JUICES: only apple, WHITE grape, WHITE cranberry Sports drinks like Gatorade or Powerade (NO RED)                   The day of surgery:  Drink ONE (1) Pre-Surgery Clear Ensure or G2 at        AM the morning of surgery. Drink in one sitting. Do not sip.  This drink was given to you during your hospital pre-op appointment visit. Nothing else to drink after completing the Pre-Surgery Clear Ensure or G2 : No candy, chewing gum or throat lozenges.     Oral Hygiene is also important to reduce your risk of infection.        Remember - BRUSH YOUR TEETH THE MORNING OF SURGERY WITH YOUR REGULAR TOOTHPASTE  Do NOT smoke after Midnight the night before surgery.  STOP TAKING all Vitamins, Herbs and supplements 1 week before your surgery.   Take ONLY these medicines the morning of surgery with A SIP OF WATER:    If You have been diagnosed with Sleep Apnea - Bring CPAP mask and tubing day of surgery. We will provide you with a CPAP machine on the day of your surgery.                   You may not have any metal on your body including hair pins, jewelry, and body piercing  Do not wear make-up, lotions, powders, perfumes / cologne, or deodorant  Do not wear nail polish including gel and S&S, artificial / acrylic nails, or any other type of covering on natural nails including finger and toenails. If you have artificial nails, gel coating, etc., that needs to be removed by a nail salon, Please have this removed prior to surgery. Not doing so may mean that your surgery could be cancelled or delayed if the Surgeon or anesthesia staff feels like they are unable to  monitor you safely.   Do not shave 48 hours prior to surgery to avoid nicks in your skin which may contribute to postoperative infections.   Men may shave face and  neck.  Contacts, Hearing Aids, dentures or bridgework may not be worn into surgery. DENTURES WILL BE REMOVED PRIOR TO SURGERY PLEASE DO NOT APPLY Poly grip OR ADHESIVES!!!  You may bring a small overnight bag with you on the day of surgery, only pack items that are not valuable. [facility] IS NOT RESPONSIBLE   FOR VALUABLES THAT ARE LOST OR STOLEN.   Patients discharged on the day of surgery will not be allowed to drive home.  Someone NEEDS to stay with you for the first 24 hours after anesthesia.  Do not bring your home medications to the hospital. The Pharmacy will dispense medications listed on your medication list to you during your admission in the Hospital.  Special Instructions: Bring a copy of your healthcare power of attorney and living will documents the day of surgery, if you wish to have them scanned into your [facility]- EPIC  Please read over the following fact sheets you were given: IF YOU HAVE QUESTIONS ABOUT YOUR PRE-OP INSTRUCTIONS, PLEASE CALL [phone]   [facility] - Preparing for Surgery Before surgery, you can play an important role.  Because skin is not sterile, your skin needs to be as free of germs as possible.  You can reduce the number of germs on your skin by washing with CHG (chlorahexidine gluconate) soap before surgery.  CHG is an antiseptic cleaner which kills germs and bonds with the skin to continue killing germs even after washing. Please DO NOT use if you have an allergy to CHG or antibacterial soaps.  If your skin becomes reddened/irritated stop using the CHG and inform your nurse when you arrive at Short Stay. Do not shave (including legs and underarms) for at least 48 hours prior to the first CHG shower.  You may shave your face/neck.  Please follow these instructions carefully:  1.  Shower with CHG Soap the night before surgery and the  morning of surgery.  2.  If you choose to wash your hair, wash your hair first as usual with your  normal  shampoo.  3.  After you shampoo, rinse your hair and body thoroughly to remove the shampoo.                             4.  Use CHG as you would any other liquid soap.  You can apply chg directly to the skin and wash.  Gently with a scrungie or clean washcloth.  5.  Apply the CHG Soap to your body ONLY FROM THE NECK DOWN.   Do not use on face/ open                           Wound or open sores. Avoid contact with eyes, ears mouth and genitals (private parts).                       Wash face,  Genitals (private parts) with your normal soap.             6.  Wash thoroughly, paying special attention to the area where your  surgery  will be performed.  7.  Thoroughly rinse your body with warm water from the neck  down.  8.  DO NOT shower/wash with your normal soap after using and rinsing off the CHG Soap.            9.  Pat yourself dry with a clean towel.            10.  Wear clean pajamas.            11.  Place clean sheets on your bed the night of your first shower and do not  sleep with pets.  ON THE DAY OF SURGERY : Do not apply any lotions/deodorants the morning of surgery.  Please wear clean clothes to the hospital/surgery center.     FAILURE TO FOLLOW THESE INSTRUCTIONS MAY RESULT IN THE CANCELLATION OF YOUR SURGERY  PATIENT SIGNATURE_________________________________  NURSE SIGNATURE__________________________________  ________________________________________________________________________

## 2023-11-11 ENCOUNTER — Other Ambulatory Visit: Payer: Self-pay

## 2023-11-11 ENCOUNTER — Encounter (HOSPITAL_COMMUNITY)
Admission: RE | Admit: 2023-11-11 | Discharge: 2023-11-11 | Disposition: A | Discharge status: Home / Self Care | Source: Ambulatory Visit | Attending: Urology | Admitting: Urology

## 2023-11-11 ENCOUNTER — Encounter (HOSPITAL_COMMUNITY): Payer: Self-pay

## 2023-11-11 DIAGNOSIS — I2510 Atherosclerotic heart disease of native coronary artery without angina pectoris: Secondary | ICD-10-CM | POA: Insufficient documentation

## 2023-11-11 DIAGNOSIS — Z01818 Encounter for other preprocedural examination: Secondary | ICD-10-CM | POA: Diagnosis present

## 2023-11-11 DIAGNOSIS — I10 Essential (primary) hypertension: Secondary | ICD-10-CM | POA: Insufficient documentation

## 2023-11-11 DIAGNOSIS — F319 Bipolar disorder, unspecified: Secondary | ICD-10-CM | POA: Diagnosis not present

## 2023-11-11 DIAGNOSIS — C61 Malignant neoplasm of prostate: Secondary | ICD-10-CM | POA: Insufficient documentation

## 2023-11-11 DIAGNOSIS — Z7982 Long term (current) use of aspirin: Secondary | ICD-10-CM | POA: Insufficient documentation

## 2023-11-11 DIAGNOSIS — Z79899 Other long term (current) drug therapy: Secondary | ICD-10-CM | POA: Insufficient documentation

## 2023-11-11 DIAGNOSIS — Z951 Presence of aortocoronary bypass graft: Secondary | ICD-10-CM | POA: Diagnosis not present

## 2023-11-11 DIAGNOSIS — F419 Anxiety disorder, unspecified: Secondary | ICD-10-CM | POA: Diagnosis not present

## 2023-11-11 DIAGNOSIS — K219 Gastro-esophageal reflux disease without esophagitis: Secondary | ICD-10-CM | POA: Diagnosis not present

## 2023-11-11 HISTORY — DX: Atherosclerotic heart disease of native coronary artery without angina pectoris: I25.10

## 2023-11-11 HISTORY — DX: Malignant (primary) neoplasm, unspecified: C80.1

## 2023-11-11 HISTORY — DX: Prediabetes: R73.03

## 2023-11-11 HISTORY — DX: Personal history of urinary calculi: Z87.442

## 2023-11-11 LAB — CBC
HCT: 47.5 % (ref 39.0–52.0)
Hemoglobin: 15.6 g/dL (ref 13.0–17.0)
MCH: 27.7 pg (ref 26.0–34.0)
MCHC: 32.8 g/dL (ref 30.0–36.0)
MCV: 84.4 fL (ref 80.0–100.0)
Platelets: 264 K/uL (ref 150–400)
RBC: 5.63 MIL/uL (ref 4.22–5.81)
RDW: 13.8 % (ref 11.5–15.5)
WBC: 6.5 K/uL (ref 4.0–10.5)
nRBC: 0 % (ref 0.0–0.2)

## 2023-11-11 LAB — BASIC METABOLIC PANEL WITH GFR
Anion gap: 7 (ref 5–15)
BUN: 22 mg/dL — ABNORMAL HIGH (ref 6–20)
CO2: 24 mmol/L (ref 22–32)
Calcium: 9.2 mg/dL (ref 8.9–10.3)
Chloride: 106 mmol/L (ref 98–111)
Creatinine, Ser: 0.88 mg/dL (ref 0.61–1.24)
GFR, Estimated: >60 mL/min (ref >60)
Glucose, Bld: 121 mg/dL — ABNORMAL HIGH (ref 70–99)
Potassium: 3.9 mmol/L (ref 3.5–5.1)
Sodium: 137 mmol/L (ref 135–145)

## 2023-11-11 NOTE — Progress Notes (Signed)
 For Anesthesia: PCP - No PCP Cardiologist - Pietro Redell RAMAN, MD  Clearance: Jadine Aline BRAVO, PA-C : 10/18/23 Bowel Prep reminder:  Chest x-ray - 07/16/23 EKG - 07/16/23 Stress Test -  ECHO - 05/10/22 Cardiac Cath - 05/10/22 Pacemaker/ICD device last checked: Pacemaker orders received: Device Rep notified:  Spinal Cord Stimulator:N/A  Sleep Study - N/A CPAP -   Fasting Blood Sugar - N/A Checks Blood Sugar _____ times a day Date and result of last Hgb A1c-  Last dose of GLP1 agonist- N/A GLP1 instructions:   Last dose of SGLT-2 inhibitors- N/A SGLT-2 instructions:   Blood Thinner Instructions: Aspirin  Instructions: Will be hold a week before surgery. Last Dose:  Activity level: Can go up a flight of stairs and activities of daily living without stopping and without chest pain and/or shortness of breath   Able to exercise without chest pain and/or shortness of breath  Anesthesia review:Hx: HTN,Pre-DIA,CAD,CABG   Patient denies shortness of breath, fever, cough and chest pain at PAT appointment   Patient verbalized understanding of instructions that were reviewed over the telephone.

## 2023-11-12 LAB — URINE CULTURE: Culture: <10000 — AB

## 2023-11-14 NOTE — Anesthesia Preprocedure Evaluation (Signed)
 Anesthesia Evaluation  Patient identified by MRN, date of birth, ID band Patient awake    Reviewed: Allergy & Precautions, NPO status , Patient's Chart, lab work & pertinent test results  History of Anesthesia Complications Negative for: history of anesthetic complications  Airway Mallampati: III  TM Distance: >3 FB Neck ROM: Full   Comment: Previous grade 1 view with MAC 4, easy mask Dental  (+) Dental Advisory Given,    Pulmonary neg pulmonary ROS   Pulmonary exam normal breath sounds clear to auscultation       Cardiovascular hypertension (amlodipine , ISMN, metoprolol ), Pt. on medications and Pt. on home beta blockers (-) angina + CAD and + CABG (05/12/2022)  (-) dysrhythmias  Rhythm:Regular Rate:Normal  HLD   Neuro/Psych neg Seizures PSYCHIATRIC DISORDERS (ADHD) Anxiety Depression Bipolar Disorder    Neuromuscular disease (hand paresthesia)    GI/Hepatic ,GERD  Medicated,,Non-alcoholic liver disease   Endo/Other  Pre-diabetes  Renal/GU negative Renal ROS   Prostate cancer    Musculoskeletal   Abdominal  (+) + obese  Peds  Hematology negative hematology ROS (+) Lab Results      Component                Value               Date                      WBC                      6.5                 11/11/2023                HGB                      15.6                11/11/2023                HCT                      47.5                11/11/2023                MCV                      84.4                11/11/2023                PLT                      264                 11/11/2023              Anesthesia Other Findings   Reproductive/Obstetrics                              Anesthesia Physical Anesthesia Plan  ASA: 3  Anesthesia Plan: General   Post-op Pain Management: Tylenol  PO (pre-op)*   Induction: Intravenous  PONV Risk Score and Plan: 2 and Ondansetron , Dexamethasone   and Treatment may vary due to age or medical condition  Airway Management Planned:  Oral ETT  Additional Equipment:   Intra-op Plan:   Post-operative Plan: Extubation in OR  Informed Consent: I have reviewed the patients History and Physical, chart, labs and discussed the procedure including the risks, benefits and alternatives for the proposed anesthesia with the patient or authorized representative who has indicated his/her understanding and acceptance.     Dental advisory given  Plan Discussed with: CRNA and Anesthesiologist  Anesthesia Plan Comments: (See PAT note from 8/22  Risks of general anesthesia discussed including, but not limited to, sore throat, hoarse voice, chipped/damaged teeth, injury to vocal cords, nausea and vomiting, allergic reactions, lung infection, heart attack, stroke, and death. All questions answered. )         Anesthesia Quick Evaluation

## 2023-11-14 NOTE — Progress Notes (Signed)
 Case: [id number] Date/Time: 11/17/23 0715   Procedures:      PROSTATECTOMY, RADICAL, ROBOT-ASSISTED, LAPAROSCOPIC - ROBOTIC RADICAL PROSTATECTOMY AND BILATERAL PELVIC LYMPH NODE DISSECTION     LYMPHADENECTOMY, PELVIS, ROBOT-ASSISTED (Bilateral)   Anesthesia type: General   Diagnosis: Prostate cancer (HCC) [C61]   Pre-op diagnosis: PROSTATE CANCER   Location: [facility] ROOM 03 / [facility]   Surgeons: [name], MD       DISCUSSION: Glenn Stewart is a 58 yo male with PMH of HTN, CAD s/p CABG x 5 (04/2022), post op A.fib, GERD, bipolar d/o, anxiety, prostate cancer.  Patient follows with Cardiology for CAD s/p CABG x 5 on 05/12/22. Had post op A.fib and was started on Amiodirone and Eliquis  which were discontinued in 09/2022. He was seen in the ED in 06/2023 for chest pain. EKG at that time showed no acute ischemic changes and high-sensitivity troponin negative x2. Symptoms resolved with GI cocktail and Protonix . Felt to be secondary to GERD. Last seen in clinic on 10/18/2023. He was stable at that visit. Cleared for prostate surgery and advised f/u in 1 year:  Pre-Op Evaluation Patient had radial prostatectomy and bilateral pelvic lymph node dissection scheduled for 11/17/2023. He is doing well from a cardiac standpoint. No angina, shortness of breath, acute CHF symptoms, palpitations, dizziness, or syncope. He is able to complete >4.0 METS of physical activity without any anginal symptoms. Revised Cardiac Risk Index = 1 (CAD) indicating a 1.1% of a major cardiac event perioperatively. Therefore, based on ACC/AHA guidelines, patient would be at acceptable risk for the planned procedure without further cardiovascular testing. Recommend continuing beta-blocker (Lopressor ) perioperatively. Regarding Aspirin  therapy, recommend continuation of Aspirin  throughout the perioperative period if possible.  However, if the surgeon feels that the bleeding risk is too high while on Aspirin , okay to hold this for 5-7  days prior to surgery with a plan to resume it as soon as felt to be feasible from a surgical standpoint in the post-operative period. I will route this recommendation to the requesting party via Epic fax function.  VS: BP 121/88   Pulse 89   Temp 36.7 C (Oral)   Ht 5' 6.5 (1.689 m)   Wt 102.5 kg   SpO2 97%   BMI 35.93 kg/m   PROVIDERS: Pcp, No   LABS: Labs reviewed: Acceptable for surgery. (all labs ordered are listed, but only abnormal results are displayed)  Labs Reviewed  URINE CULTURE - Abnormal; Notable for the following components:      Result Value   Culture   (*)    Value: <10,000 COLONIES/mL INSIGNIFICANT GROWTH Performed at [facility], [address], [place], [place] [address]    All other components within normal limits  BASIC METABOLIC PANEL WITH GFR - Abnormal; Notable for the following components:   Glucose, Bld 121 (*)    BUN 22 (*)    All other components within normal limits  CBC  TYPE AND SCREEN     IMAGES:   EKG 10/18/23:  Normal sinus rhythm, rate 76 Rightward axis  CV:  TEE 05/12/22:  POST-OP IMPRESSIONS _ Left Ventricle: The left ventricle is unchanged from pre-bypass. _ Right Ventricle: The right ventricle appears unchanged from pre-bypass. _ Aorta: The aorta appears unchanged from pre-bypass. _ Left Atrium: The left atrium appears unchanged from pre-bypass. _ Left Atrial Appendage: The left atrial appendage appears unchanged from pre-bypass. _ Aortic Valve: The aortic valve appears unchanged from pre-bypass. _ Mitral Valve: The mitral  valve appears unchanged from pre-bypass. _ Tricuspid Valve: The tricuspid valve appears unchanged from pre-bypass. _ Pulmonic Valve: The pulmonic valve appears unchanged from pre-bypass. _ Interatrial Septum: The interatrial septum appears unchanged from pre-bypass. _ Interventricular Septum: The interventricular septum appears unchanged from pre-bypass. _ Pericardium: The pericardium  appears unchanged from pre-bypass. _ Comments: Good LV function post bypass Valves as before.  LHC 05/10/22:    Prox RCA to Mid RCA lesion is 100% stenosed.   1st Mrg lesion is 99% stenosed.   Mid Cx to Dist Cx lesion is 99% stenosed.   Ost LAD to Prox LAD lesion is 70% stenosed.   Mid LAD lesion is 100% stenosed.   1st Diag lesion is 99% stenosed.   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 Severe disease in a moderate caliber Diagonal branch that arises prior to the total LAD occlusion.  Large caliber Circumflex with diffuse severe distal stenosis leading into the most distal obtuse marginal branch. (Functional CTO). The first obtuse marginal branch is a moderate caliber vessel with severe ostial stenosis.  The RCA is a large dominant artery with chronic total occlusion of the mid vessel The distal vessel fills from right to right collaterals and left to right collaterals.    Recommendations; He has severe three vessel CAD with chronic occlusion of the mid RCA, mid LAD and distal Circumflex with severe disease in the early obtuse marginal branch and in the diagonal branch. Bypass surgery appears to be the best method for revascularization. Will ask CT surgery to see him today to discuss bypass. Will resume IV heparin  2 hours post sheath pull. Continue ASA, statin and beta blocker.   Echo 05/10/22:  IMPRESSIONS     1. Inferior basal hypokinesis . Left ventricular ejection fraction, by  estimation, is 50 to 55%. The left ventricle has low normal function. The  left ventricle demonstrates regional wall motion abnormalities (see  scoring diagram/findings for  description). Left ventricular diastolic parameters were normal.   2. Right ventricular systolic function is normal. The right ventricular  size is normal.   3. Left atrial size was mildly dilated.   4. The mitral valve is abnormal. Trivial mitral  valve regurgitation. No  evidence of mitral stenosis.   5. The aortic valve is tricuspid. There is mild calcification of the  aortic valve. Aortic valve regurgitation is not visualized. Aortic valve  sclerosis is present, with no evidence of aortic valve stenosis.   6. The inferior vena cava is normal in size with greater than 50%  respiratory variability, suggesting right atrial pressure of 3 mmHg.   Past Medical History:  Diagnosis Date   ADHD (attention deficit hyperactivity disorder)    Anxiety state, unspecified    Bipolar 1 disorder (HCC)    Cancer (HCC)    CHEST PAIN 12/04/2008   Qualifier: Diagnosis of  By: [name] MD, [name]    Coronary artery disease    Depressive disorder, not elsewhere classified    Dermatophytosis of nail    Esophageal reflux    History of kidney stones    HYPERGLYCEMIA 07/10/2007   Qualifier: Diagnosis of  By: [name] MD, [name]    Low back pain 09/18/2013   Other chronic nonalcoholic liver disease    Pre-diabetes    Problems with hearing    Prostatitis, unspecified    Pure hypercholesterolemia    Screen for STD (sexually transmitted  disease) 04/20/2011   Sprain of cruciate ligament of knee    Tear of medial cartilage or meniscus of knee, current    TMJ (temporomandibular joint disorder) 12/25/2010   Unspecified essential hypertension    Unspecified gastritis and gastroduodenitis without mention of hemorrhage    Urethritis, unspecified     Past Surgical History:  Procedure Laterality Date   CORONARY ARTERY BYPASS GRAFT N/A 05/12/2022   Procedure: CORONARY ARTERY BYPASS GRAFTING (CABG) X FIVE BYPASSES USING OPEN LEFT INTERNAL MAMMARY ARTERY, OPEN LEFT RADIAL ARTERY, AND ENDOSCOPIC RIGHT GREATER SAPHENOUS VEIN HARVEST.;  Surgeon: [name], MD;  Location: [facility];  Service: Open Heart Surgery;  Laterality: N/A;   CYSTOSCOPY W/ LITHOLAPAXY / EHL     LEFT HEART CATH AND CORONARY ANGIOGRAPHY N/A 05/10/2022   Procedure: LEFT HEART CATH  AND CORONARY ANGIOGRAPHY;  Surgeon: [name], MD;  Location: [facility];  Service: Cardiovascular;  Laterality: N/A;   No prior surgery     RADIAL ARTERY HARVEST Left 05/12/2022   Procedure: RADIAL ARTERY HARVEST;  Surgeon: [name], MD;  Location: [facility] OR;  Service: Open Heart Surgery;  Laterality: Left;   TEE WITHOUT CARDIOVERSION N/A 05/12/2022   Procedure: TRANSESOPHAGEAL ECHOCARDIOGRAM;  Surgeon: [name], MD;  Location: [facility] OR;  Service: Open Heart Surgery;  Laterality: N/A;    MEDICATIONS:  acetaminophen  (TYLENOL ) 325 MG tablet   ALPRAZolam  (XANAX ) 1 MG tablet   amLODipine  (NORVASC ) 5 MG tablet   aspirin  EC 81 MG tablet   atorvastatin  (LIPITOR) 80 MG tablet   fish oil-omega-3 fatty acids 1000 MG capsule   isosorbide  mononitrate (IMDUR ) 30 MG 24 hr tablet   metoprolol  tartrate (LOPRESSOR ) 25 MG tablet   Multiple Vitamin (MULTIVITAMIN) capsule   pantoprazole  (PROTONIX ) 20 MG tablet   sertraline  (ZOLOFT ) 100 MG tablet   tamsulosin  (FLOMAX ) 0.4 MG CAPS capsule   No current facility-administered medications for this encounter.   Burnard CHRISTELLA Odis DEVONNA [facility] Short Stay/Anesthesiology [facility] Phone [phone] 11/14/2023 11:18 AM

## 2023-11-17 ENCOUNTER — Encounter (HOSPITAL_COMMUNITY)
Admission: RE | Disposition: A | Discharge status: Home / Self Care | Payer: Self-pay | Source: Ambulatory Visit | Attending: Urology

## 2023-11-17 ENCOUNTER — Encounter (HOSPITAL_COMMUNITY): Payer: Self-pay | Admitting: Urology

## 2023-11-17 ENCOUNTER — Other Ambulatory Visit: Payer: Self-pay

## 2023-11-17 ENCOUNTER — Ambulatory Visit (HOSPITAL_COMMUNITY): Payer: Self-pay | Admitting: Medical

## 2023-11-17 ENCOUNTER — Observation Stay (HOSPITAL_COMMUNITY)
Admission: RE | Admit: 2023-11-17 | Discharge: 2023-11-18 | Disposition: A | Discharge status: Home / Self Care | Source: Ambulatory Visit | Attending: Urology | Admitting: Urology

## 2023-11-17 ENCOUNTER — Ambulatory Visit (HOSPITAL_COMMUNITY): Payer: Self-pay | Admitting: Certified Registered"

## 2023-11-17 DIAGNOSIS — F418 Other specified anxiety disorders: Secondary | ICD-10-CM

## 2023-11-17 DIAGNOSIS — C61 Malignant neoplasm of prostate: Principal | ICD-10-CM | POA: Insufficient documentation

## 2023-11-17 DIAGNOSIS — Z7982 Long term (current) use of aspirin: Secondary | ICD-10-CM | POA: Diagnosis not present

## 2023-11-17 DIAGNOSIS — I10 Essential (primary) hypertension: Secondary | ICD-10-CM

## 2023-11-17 DIAGNOSIS — I2510 Atherosclerotic heart disease of native coronary artery without angina pectoris: Secondary | ICD-10-CM | POA: Diagnosis not present

## 2023-11-17 DIAGNOSIS — Z79899 Other long term (current) drug therapy: Secondary | ICD-10-CM | POA: Diagnosis not present

## 2023-11-17 DIAGNOSIS — Z125 Encounter for screening for malignant neoplasm of prostate: Principal | ICD-10-CM

## 2023-11-17 HISTORY — PX: ROBOT ASSISTED LAPAROSCOPIC RADICAL PROSTATECTOMY: SHX5141

## 2023-11-17 LAB — TYPE AND SCREEN
ABO/RH(D): B POS
Antibody Screen: NEGATIVE

## 2023-11-17 LAB — HEMOGLOBIN AND HEMATOCRIT, BLOOD
HCT: 44.9 % (ref 39.0–52.0)
Hemoglobin: 14 g/dL (ref 13.0–17.0)

## 2023-11-17 SURGERY — PROSTATECTOMY, RADICAL, ROBOT-ASSISTED, LAPAROSCOPIC
Anesthesia: General | Site: Prostate

## 2023-11-17 MED ORDER — FENTANYL CITRATE PF 50 MCG/ML IJ SOSY: 25.0000 ug | PREFILLED_SYRINGE | INTRAMUSCULAR | Status: DC | PRN

## 2023-11-17 MED ORDER — EPHEDRINE SULFATE-NACL 50-0.9 MG/10ML-% IV SOSY
PREFILLED_SYRINGE | INTRAVENOUS | Status: DC | PRN
  Administered 2023-11-17: 5 mg via INTRAVENOUS

## 2023-11-17 MED ORDER — ONDANSETRON HCL 4 MG/2ML IJ SOLN
INTRAMUSCULAR | Status: AC
  Filled 2023-11-17: qty 2

## 2023-11-17 MED ORDER — ORAL CARE MOUTH RINSE: 15.0000 mL | Freq: Once | OROMUCOSAL | Status: AC

## 2023-11-17 MED ORDER — PROPOFOL 10 MG/ML IV BOLUS
INTRAVENOUS | Status: AC
  Filled 2023-11-17: qty 20

## 2023-11-17 MED ORDER — FENTANYL CITRATE (PF) 100 MCG/2ML IJ SOLN
INTRAMUSCULAR | Status: AC
  Filled 2023-11-17: qty 2

## 2023-11-17 MED ORDER — KETOROLAC TROMETHAMINE 15 MG/ML IJ SOLN
15.0000 mg | Freq: Four times a day (QID) | INTRAMUSCULAR | Status: DC
  Administered 2023-11-17 – 2023-11-18 (×4): 15 mg via INTRAVENOUS
  Filled 2023-11-17 (×4): qty 1

## 2023-11-17 MED ORDER — LACTATED RINGERS IV SOLN: INTRAVENOUS | Status: DC

## 2023-11-17 MED ORDER — BUPIVACAINE-EPINEPHRINE (PF) 0.5% -1:200000 IJ SOLN
INTRAMUSCULAR | Status: AC
  Filled 2023-11-17: qty 30

## 2023-11-17 MED ORDER — PANTOPRAZOLE SODIUM 40 MG PO TBEC: 40.0000 mg | DELAYED_RELEASE_TABLET | Freq: Every day | ORAL | Status: DC | PRN

## 2023-11-17 MED ORDER — FENTANYL CITRATE (PF) 100 MCG/2ML IJ SOLN
INTRAMUSCULAR | Status: DC | PRN
  Administered 2023-11-17: 100 ug via INTRAVENOUS

## 2023-11-17 MED ORDER — HYOSCYAMINE SULFATE 0.125 MG SL SUBL
SUBLINGUAL_TABLET | SUBLINGUAL | Status: AC
  Filled 2023-11-17: qty 1

## 2023-11-17 MED ORDER — ALPRAZOLAM 0.5 MG PO TABS
1.0000 mg | ORAL_TABLET | Freq: Every day | ORAL | Status: DC | PRN
  Administered 2023-11-17: 1 mg via ORAL
  Filled 2023-11-17: qty 2

## 2023-11-17 MED ORDER — STERILE WATER FOR IRRIGATION IR SOLN
Status: DC | PRN
  Administered 2023-11-17: 1000 mL

## 2023-11-17 MED ORDER — ROCURONIUM BROMIDE 10 MG/ML (PF) SYRINGE
PREFILLED_SYRINGE | INTRAVENOUS | Status: AC
Start: 2023-11-17 — End: 2023-11-17
  Filled 2023-11-17: qty 10

## 2023-11-17 MED ORDER — ACETAMINOPHEN 10 MG/ML IV SOLN
INTRAVENOUS | Status: AC
  Filled 2023-11-17: qty 100

## 2023-11-17 MED ORDER — SERTRALINE HCL 100 MG PO TABS
100.0000 mg | ORAL_TABLET | Freq: Every day | ORAL | Status: DC
  Administered 2023-11-18: 100 mg via ORAL
  Filled 2023-11-17: qty 1

## 2023-11-17 MED ORDER — TRAMADOL HCL 50 MG PO TABS: 50.0000 mg | ORAL_TABLET | Freq: Four times a day (QID) | ORAL | 0 refills | Status: DC | PRN

## 2023-11-17 MED ORDER — CEFAZOLIN SODIUM-DEXTROSE 2-4 GM/100ML-% IV SOLN
2.0000 g | INTRAVENOUS | Status: AC
  Administered 2023-11-17 (×2): 2 g via INTRAVENOUS
  Filled 2023-11-17: qty 100

## 2023-11-17 MED ORDER — ISOSORBIDE MONONITRATE ER 30 MG PO TB24
30.0000 mg | ORAL_TABLET | Freq: Every day | ORAL | Status: DC
  Administered 2023-11-18: 30 mg via ORAL
  Filled 2023-11-17: qty 1

## 2023-11-17 MED ORDER — ALBUMIN HUMAN 5 % IV SOLN: INTRAVENOUS | Status: DC | PRN

## 2023-11-17 MED ORDER — ROCURONIUM BROMIDE 10 MG/ML (PF) SYRINGE
PREFILLED_SYRINGE | INTRAVENOUS | Status: AC
  Filled 2023-11-17: qty 10

## 2023-11-17 MED ORDER — METOPROLOL TARTRATE 25 MG PO TABS
25.0000 mg | ORAL_TABLET | Freq: Two times a day (BID) | ORAL | Status: DC
  Administered 2023-11-17 – 2023-11-18 (×2): 25 mg via ORAL
  Filled 2023-11-17 (×2): qty 1

## 2023-11-17 MED ORDER — MIDAZOLAM HCL 2 MG/2ML IJ SOLN
INTRAMUSCULAR | Status: AC
  Filled 2023-11-17: qty 2

## 2023-11-17 MED ORDER — BUPIVACAINE LIPOSOME 1.3 % IJ SUSP
INTRAMUSCULAR | Status: AC
  Filled 2023-11-17: qty 20

## 2023-11-17 MED ORDER — DOCUSATE SODIUM 100 MG PO CAPS: 100.0000 mg | ORAL_CAPSULE | Freq: Two times a day (BID) | ORAL | Status: AC

## 2023-11-17 MED ORDER — OXYCODONE HCL 5 MG PO TABS
5.0000 mg | ORAL_TABLET | Freq: Once | ORAL | Status: AC | PRN
  Administered 2023-11-17: 5 mg via ORAL

## 2023-11-17 MED ORDER — MIDAZOLAM HCL 2 MG/2ML IJ SOLN
INTRAMUSCULAR | Status: DC | PRN
  Administered 2023-11-17: 2 mg via INTRAVENOUS

## 2023-11-17 MED ORDER — PROPOFOL 10 MG/ML IV BOLUS
INTRAVENOUS | Status: DC | PRN
  Administered 2023-11-17: 200 mg via INTRAVENOUS

## 2023-11-17 MED ORDER — SUGAMMADEX SODIUM 200 MG/2ML IV SOLN
INTRAVENOUS | Status: DC | PRN
  Administered 2023-11-17: 200 mg via INTRAVENOUS

## 2023-11-17 MED ORDER — ACETAMINOPHEN 10 MG/ML IV SOLN
1000.0000 mg | Freq: Four times a day (QID) | INTRAVENOUS | Status: DC
  Administered 2023-11-17 – 2023-11-18 (×3): 1000 mg via INTRAVENOUS
  Filled 2023-11-17 (×3): qty 100

## 2023-11-17 MED ORDER — DOCUSATE SODIUM 100 MG PO CAPS
100.0000 mg | ORAL_CAPSULE | Freq: Two times a day (BID) | ORAL | Status: DC
  Administered 2023-11-17 – 2023-11-18 (×2): 100 mg via ORAL
  Filled 2023-11-17 (×2): qty 1

## 2023-11-17 MED ORDER — DEXAMETHASONE SODIUM PHOSPHATE 10 MG/ML IJ SOLN
INTRAMUSCULAR | Status: AC
Start: 2023-11-17 — End: 2023-11-17
  Filled 2023-11-17: qty 1

## 2023-11-17 MED ORDER — SUGAMMADEX SODIUM 200 MG/2ML IV SOLN
INTRAVENOUS | Status: AC
  Filled 2023-11-17: qty 2

## 2023-11-17 MED ORDER — DIPHENHYDRAMINE HCL 50 MG/ML IJ SOLN: 12.5000 mg | Freq: Four times a day (QID) | INTRAMUSCULAR | Status: DC | PRN

## 2023-11-17 MED ORDER — LIDOCAINE HCL (CARDIAC) PF 100 MG/5ML IV SOSY
PREFILLED_SYRINGE | INTRAVENOUS | Status: DC | PRN
  Administered 2023-11-17: 100 mg via INTRAVENOUS

## 2023-11-17 MED ORDER — BUPIVACAINE LIPOSOME 1.3 % IJ SUSP
INTRAMUSCULAR | Status: DC | PRN
  Administered 2023-11-17: 20 mL

## 2023-11-17 MED ORDER — BUPIVACAINE-EPINEPHRINE 0.5% -1:200000 IJ SOLN
INTRAMUSCULAR | Status: DC | PRN
  Administered 2023-11-17 (×2): 15 mL

## 2023-11-17 MED ORDER — ONDANSETRON HCL 4 MG/2ML IJ SOLN
INTRAMUSCULAR | Status: DC | PRN
  Administered 2023-11-17: 4 mg via INTRAVENOUS

## 2023-11-17 MED ORDER — FLEET ENEMA RE ENEM
1.0000 | ENEMA | Freq: Once | RECTAL | Status: DC
  Filled 2023-11-17: qty 1

## 2023-11-17 MED ORDER — DEXAMETHASONE SODIUM PHOSPHATE 10 MG/ML IJ SOLN
INTRAMUSCULAR | Status: DC | PRN
  Administered 2023-11-17: 8 mg via INTRAVENOUS

## 2023-11-17 MED ORDER — ONDANSETRON HCL 4 MG/2ML IJ SOLN: 4.0000 mg | INTRAMUSCULAR | Status: DC | PRN

## 2023-11-17 MED ORDER — SODIUM CHLORIDE 0.9 % IV BOLUS
1000.0000 mL | Freq: Once | INTRAVENOUS | Status: AC
  Administered 2023-11-17: 1000 mL via INTRAVENOUS

## 2023-11-17 MED ORDER — TRAMADOL HCL 50 MG PO TABS: 50.0000 mg | ORAL_TABLET | Freq: Four times a day (QID) | ORAL | Status: DC | PRN

## 2023-11-17 MED ORDER — DIPHENHYDRAMINE HCL 12.5 MG/5ML PO ELIX: 12.5000 mg | ORAL_SOLUTION | Freq: Four times a day (QID) | ORAL | Status: DC | PRN

## 2023-11-17 MED ORDER — TRIPLE ANTIBIOTIC 3.5-400-5000 EX OINT: 1.0000 | TOPICAL_OINTMENT | Freq: Three times a day (TID) | CUTANEOUS | Status: DC | PRN

## 2023-11-17 MED ORDER — LACTATED RINGERS IR SOLN
Status: DC | PRN
  Administered 2023-11-17: 1000 mL

## 2023-11-17 MED ORDER — PHENYLEPHRINE HCL-NACL 20-0.9 MG/250ML-% IV SOLN
INTRAVENOUS | Status: DC | PRN
  Administered 2023-11-17: 20 ug/min via INTRAVENOUS

## 2023-11-17 MED ORDER — KETOROLAC TROMETHAMINE 15 MG/ML IJ SOLN
INTRAMUSCULAR | Status: AC
  Filled 2023-11-17: qty 1

## 2023-11-17 MED ORDER — AMISULPRIDE (ANTIEMETIC) 5 MG/2ML IV SOLN
INTRAVENOUS | Status: AC
  Filled 2023-11-17: qty 4

## 2023-11-17 MED ORDER — ACETAMINOPHEN 500 MG PO TABS
1000.0000 mg | ORAL_TABLET | Freq: Once | ORAL | Status: AC
  Administered 2023-11-17: 1000 mg via ORAL
  Filled 2023-11-17: qty 2

## 2023-11-17 MED ORDER — AMISULPRIDE (ANTIEMETIC) 5 MG/2ML IV SOLN
10.0000 mg | Freq: Once | INTRAVENOUS | Status: AC | PRN
  Administered 2023-11-17: 10 mg via INTRAVENOUS

## 2023-11-17 MED ORDER — CIPROFLOXACIN HCL 500 MG PO TABS: 500.0000 mg | ORAL_TABLET | Freq: Two times a day (BID) | ORAL | 0 refills | Status: DC

## 2023-11-17 MED ORDER — SODIUM CHLORIDE 0.45 % IV SOLN: INTRAVENOUS | Status: DC

## 2023-11-17 MED ORDER — HYOSCYAMINE SULFATE 0.125 MG SL SUBL
0.1250 mg | SUBLINGUAL_TABLET | SUBLINGUAL | Status: DC | PRN
  Administered 2023-11-17 (×2): 0.125 mg via SUBLINGUAL
  Filled 2023-11-17: qty 1

## 2023-11-17 MED ORDER — HYDROMORPHONE HCL 1 MG/ML IJ SOLN
0.5000 mg | INTRAMUSCULAR | Status: DC | PRN
  Administered 2023-11-17: 1 mg via INTRAVENOUS
  Filled 2023-11-17: qty 1

## 2023-11-17 MED ORDER — OXYCODONE HCL 5 MG PO TABS
ORAL_TABLET | ORAL | Status: AC
  Filled 2023-11-17: qty 1

## 2023-11-17 MED ORDER — AMLODIPINE BESYLATE 10 MG PO TABS
5.0000 mg | ORAL_TABLET | Freq: Every day | ORAL | Status: DC
  Administered 2023-11-18: 5 mg via ORAL
  Filled 2023-11-17: qty 1

## 2023-11-17 MED ORDER — CHLORHEXIDINE GLUCONATE 0.12 % MT SOLN
15.0000 mL | Freq: Once | OROMUCOSAL | Status: AC
Start: 2023-11-17 — End: 2023-11-17
  Administered 2023-11-17: 15 mL via OROMUCOSAL

## 2023-11-17 MED ORDER — OXYCODONE HCL 5 MG/5ML PO SOLN: 5.0000 mg | Freq: Once | ORAL | Status: AC | PRN

## 2023-11-17 MED ORDER — ATORVASTATIN CALCIUM 40 MG PO TABS
80.0000 mg | ORAL_TABLET | Freq: Every day | ORAL | Status: DC
  Administered 2023-11-18: 80 mg via ORAL
  Filled 2023-11-17: qty 2

## 2023-11-17 MED ORDER — ROCURONIUM BROMIDE 10 MG/ML (PF) SYRINGE
PREFILLED_SYRINGE | INTRAVENOUS | Status: DC | PRN
  Administered 2023-11-17: 10 mg via INTRAVENOUS
  Administered 2023-11-17 (×2): 20 mg via INTRAVENOUS
  Administered 2023-11-17: 10 mg via INTRAVENOUS
  Administered 2023-11-17: 20 mg via INTRAVENOUS
  Administered 2023-11-17: 60 mg via INTRAVENOUS
  Administered 2023-11-17: 20 mg via INTRAVENOUS

## 2023-11-17 MED ORDER — KETAMINE HCL 50 MG/5ML IJ SOSY
PREFILLED_SYRINGE | INTRAMUSCULAR | Status: AC
  Filled 2023-11-17: qty 5

## 2023-11-17 MED ORDER — KETAMINE HCL 50 MG/5ML IJ SOSY
PREFILLED_SYRINGE | INTRAMUSCULAR | Status: DC | PRN
  Administered 2023-11-17: 20 mg via INTRAVENOUS
  Administered 2023-11-17: 10 mg via INTRAVENOUS

## 2023-11-17 SURGICAL SUPPLY — 50 items
APPLICATOR COTTON TIP 6 STRL (MISCELLANEOUS) ×2 IMPLANT
BAG COUNTER SPONGE SURGICOUNT (BAG) IMPLANT
CATH FOLEY 2WAY SLVR 18FR 30CC (CATHETERS) ×2 IMPLANT
CATH TIEMANN FOLEY 18FR 5CC (CATHETERS) ×2 IMPLANT
CHLORAPREP W/TINT 26 (MISCELLANEOUS) ×2 IMPLANT
CLIP LIGATING HEM O LOK PURPLE (MISCELLANEOUS) ×2 IMPLANT
COVER SURGICAL LIGHT HANDLE (MISCELLANEOUS) ×2 IMPLANT
COVER TIP SHEARS 8 DVNC (MISCELLANEOUS) ×2 IMPLANT
CUTTER ECHEON FLEX ENDO 45 340 (ENDOMECHANICALS) ×2 IMPLANT
DERMABOND ADVANCED .7 DNX12 (GAUZE/BANDAGES/DRESSINGS) ×2 IMPLANT
DRAPE ARM DVNC X/XI (DISPOSABLE) ×8 IMPLANT
DRAPE COLUMN DVNC XI (DISPOSABLE) ×2 IMPLANT
DRAPE SURG IRRIG POUCH 19X23 (DRAPES) ×2 IMPLANT
DRIVER NDL LRG 8 DVNC XI (INSTRUMENTS) ×4 IMPLANT
DRIVER NDLE LRG 8 DVNC XI (INSTRUMENTS) ×4 IMPLANT
DRSG TEGADERM 4X4.75 (GAUZE/BANDAGES/DRESSINGS) ×2 IMPLANT
ELECT PENCIL ROCKER SW 15FT (MISCELLANEOUS) ×2 IMPLANT
ELECT REM PT RETURN 15FT ADLT (MISCELLANEOUS) ×2 IMPLANT
FORCEPS BPLR LNG DVNC XI (INSTRUMENTS) ×2 IMPLANT
FORCEPS PROGRASP DVNC XI (FORCEP) ×2 IMPLANT
GAUZE 4X4 16PLY ~~LOC~~+RFID DBL (SPONGE) IMPLANT
GAUZE SPONGE 2X2 8PLY STRL LF (GAUZE/BANDAGES/DRESSINGS) IMPLANT
GAUZE SPONGE 4X4 12PLY STRL (GAUZE/BANDAGES/DRESSINGS) ×2 IMPLANT
GLOVE BIO SURGEON STRL SZ 6.5 (GLOVE) ×2 IMPLANT
GLOVE SURG LX STRL 7.5 STRW (GLOVE) ×4 IMPLANT
GOWN STRL REUS W/ TWL XL LVL3 (GOWN DISPOSABLE) ×4 IMPLANT
GOWN STRL SURGICAL XL XLNG (GOWN DISPOSABLE) ×2 IMPLANT
HOLDER FOLEY CATH W/STRAP (MISCELLANEOUS) ×2 IMPLANT
IRRIGATION SUCT STRKRFLW 2 WTP (MISCELLANEOUS) ×2 IMPLANT
IV LACTATED RINGERS 1000ML (IV SOLUTION) ×2 IMPLANT
KIT TURNOVER KIT A (KITS) ×2 IMPLANT
PACK ROBOT UROLOGY CUSTOM (CUSTOM PROCEDURE TRAY) ×2 IMPLANT
PAD POSITIONING PINK XL (MISCELLANEOUS) ×2 IMPLANT
PLUG CATH AND CAP STRL 200 (CATHETERS) IMPLANT
RELOAD STAPLE 45 4.1 GRN THCK (STAPLE) ×2 IMPLANT
SCISSORS LAP 5X45 EPIX DISP (ENDOMECHANICALS) IMPLANT
SCISSORS MNPLR CVD DVNC XI (INSTRUMENTS) ×2 IMPLANT
SEAL UNIV 5-12 XI (MISCELLANEOUS) ×8 IMPLANT
SET TUBE SMOKE EVAC HIGH FLOW (TUBING) ×2 IMPLANT
SOL PREP POV-IOD 4OZ 10% (MISCELLANEOUS) ×2 IMPLANT
SOLUTION ELECTROSURG ANTI STCK (MISCELLANEOUS) ×2 IMPLANT
SPIKE FLUID TRANSFER (MISCELLANEOUS) ×2 IMPLANT
SUT ETHILON 3 0 PS 1 (SUTURE) ×2 IMPLANT
SUT MNCRL AB 4-0 PS2 18 (SUTURE) ×4 IMPLANT
SUT VIC AB 0 CT1 27XBRD ANTBC (SUTURE) ×2 IMPLANT
SUT VICRYL 0 UR6 27IN ABS (SUTURE) ×4 IMPLANT
SUTURE STRAT PDS 2-0 15 CT-2.5 (SUTURE) ×2 IMPLANT
SUTURE V-LC BRB 180 2/0GR6GS22 (SUTURE) ×2 IMPLANT
SUTURE VLOC BRB 180 ABS3/0GR12 (SUTURE) ×4 IMPLANT
WATER STERILE IRR 1000ML POUR (IV SOLUTION) ×2 IMPLANT

## 2023-11-17 NOTE — Op Note (Addendum)
 Preoperative diagnosis:  Prostate Cancer   Postoperative diagnosis:  same   Procedure: Robotic assisted laparoscopic radical prostatectomy Bilateral pelvic lymph node dissection  Surgeon: Morene MICAEL Salines, MD First Assistant: Alan Hammonds, PA Resident surgeon: Lyle Civil, MD  Anesthesia: General  Complications: None  Intraoperative findings:  -No obvious extraprostatic extension.  -Prostate removed en bloc with seminal vesicles  -Diffuse pelvic collaterals. Robust bilateral nerve pedicles.  EBL: 350cc  Specimens:  #1.  Prostate and seminal vesicals #2.  Bilateral pelvic lymph nodes  Indication: Glenn Stewart is a 58 y.o. patient with prostate cancer.  After reviewing the management options for treatment, he elected to proceed with the removal of his prostate. We have discussed the potential benefits and risks of the procedure, side effects of the proposed treatment, the likelihood of the patient achieving the goals of the procedure, and any potential problems that might occur during the procedure or recuperation. Informed consent has been obtained.  Description of procedure:  The patient was consented in the preoperative holding area. He was in brought back to the operating room placed the table in supine position. General anesthesia was then induced and endotracheal tube was inserted. He was then placed in dorsolithotomy position and placed in steep Trendelenburg. He was then prepped and draped in the routine sterile fashion. We, the first assistant and I, then began by making a 10 mm incision supraumbilical midline incision the skin down through into the peritoneum. Then placed a 8 mm trocar. I then inflated the abdomen and inserted the 0 robotic lens. We then placed 2 additional a 8 millimeter trochars in the patient's left lower abdomen proximally 9 cm apart and 2 trochars on the patient's right lower abdomen, one was an 8 mm trocar and the one most lateral was a 12 mm  trocar which was used as the assistant port. A 5 mm trocar was placed by triangulating the 2 right lateral ports as a second assistant port. These ports were all placed under visual guidance. Once the ports were noted to be satisfactory position the robot was docked. We started with the 0 lens, monopolar scissors in the right hand and the [place]  forceps the left hand as well as a fenestrated grasper as the third arm on the left-hand side.   We, the first assistant and I, began our dissection the posterior plane incising the peritoneum at the level of the vas deferens. Isolated the left vas deferens and dissected it proximally towards the spermatic cord for 5 cm prior to ligating it. Then used this as traction to isolate the left the seminal vesicle which was then undressed bluntly and completely dissected out, all vessels were cauterized with a combination of bipolar and the monopolar scissors. We then turned our attention to the right side and similarly dissected out the right vas deferens and seminal vesicle. Once the SVs had been freed, we turned our attention to the posterior plane and bluntly dissected the tissue between the rectum and the posterior wall of the prostate bluntly out towards the apex.    At this point the bladder was taken down starting at the urachal remnant with a combination of both blunt dissection and sharp dissection using monopolar cautery the bladder was dropped down in the usual fashion to the medial umbilical ligaments laterally and the dorsal vein of the prostate anteriorly creating our space of Retzius. Because fatty tissue obscured the contour of the prostate, we elected to proceed with the lymph node dissection at this  point in the case. Attention was then turned to the left pelvic sidewall. The fibrofatty tissue between the external iliac vein, confluence of the iliac vessels, hypogastric artery, and Cooper's ligament was dissected free from the pelvic sidewall with care to  preserve the obturator nerve. Weck clips were used for lymphostasis and hemostasis. An identical procedure was performed on the contralateral side and the lymphatic packets were removed for permanent pathologic analysis.  We then turned our attention to the endopelvic fascia which was incised laterally starting on the patient's right-hand side the levator muscles were pushed off the prostate laterally up towards the dorsal vein complex on the right-hand side. This process was then repeated on the left-hand side and a nice notch was created for the dorsal vein. I then used a 45mm stapler to staple the dorsal vein.   We, the first assistant and I, then located the bladder neck at the vesicoprostatic junction and using the monopolar scissors dissected down through the perivesical tissues and the bladder neck down to the prostatic urethra. The catheter was then deflated and pulled through our urethral opening and then used to retract the prostate anteriorly for the posterior bladder neck dissection. Once through the bladder neck and into the posterior plane of the prostate, the SVs were brought through the opening. The left pedicle was then isolated and systematically ligated with Weck clips and scissors. The nerve bundle was then peeled off the posterior lateral aspect of the prostate and bluntly dissected away off the prostate. This was then repeated on the left side. We noted that were robust nerve pedicles bilaterally, which were spare.  I then came down through the dorsal venous complex anteriorly down to the membranous urethra using the monopolar. Once down to the urethra, it was transected sharply and the apex of the prostate was then dissected off the levator and rectourethralis muscles. Once the apex of the prostate had been dissected free we came back to the base of the prostate and bluntly push the rectum and nerve vascular bundle off the prostate the patient's left and used clips on the patient's right  to free the prostate. Once the prostate was free it was placed off to the side. The pelvis was then irrigated with normal saline and noted to be relatively hemostatic.  The prostate and both lymph node tissues were placed in the Endo Catch bag and the string brought to the 5 mm port.    The vesicourethral anastomosis was then completed with 2 interlocking 3-0 V. lock sutures running the anastomosis in the 6:00 position to the 12:00 position on each side and then tying it off on the top. The final catheter was then passed through the patient's urethra and into the bladder and 120 cc was instilled into the bladder to test the anastomosis. As there was no leak a 71 [place] Blake drain was passed through the left lateral port and placed around the vesicourethral anastomosis. A 12 mm assistant port on the right lateral side was then closed with 0 Vicryl with the help of the [employer] needle. The 12 mm midline infraumbilical incision was then extended another centimeter taken down and the fascia opened to remove the Endo Catch bag with the prostate specimen. The fascia was then closed with a 0 Vicryl and all skin ports were closed with 4-0 Monocryl in a subcutaneous fashion. Dermabond glue was then applied to the incisions. The drain was then secured to the skin with a 0 nylon stitch and dressing  applied.   At the end of the case all laps needles and sponges had been accounted for. There no immediate complications. The patient returned to the PACU in stable condition.

## 2023-11-17 NOTE — Discharge Instructions (Signed)
 Activity:  You are encouraged to ambulate frequently (about every hour during waking hours) to help prevent blood clots from forming in your legs or lungs.  However, you should not engage in any heavy lifting (> 10-15 lbs), strenuous activity, or straining. Diet: You should continue a clear liquid diet until passing gas from below.  Once this occurs, you may advance your diet to a soft diet that would be easy to digest (i.e soups, scrambled eggs, mashed potatoes, etc.) for 24 hours just as you would if getting over a bad stomach flu.  If tolerating this diet well for 24 hours, you may then begin eating regular food.  It will be normal to have some amount of bloating, nausea, and abdominal discomfort intermittently. Prescriptions:  You will be provided a prescription for pain medication to take as needed.  If your pain is not severe enough to require the prescription pain medication, you may take Tylenol  instead.  You should also take an over the counter stool softener (Colace 100 mg twice daily) to avoid straining with bowel movements as the pain medication may constipate you. Finally, you will also be provided a prescription for an antibiotic to begin the day prior to your return visit in the office for catheter removal. Catheter care: You will be taught how to take care of the catheter by the nursing staff prior to discharge from the hospital.  You may use both a leg bag and the larger bedside bag but it is recommended to at least use the bigger bedside bag at nighttime as the leg bag is small and will fill up overnight and also does not drain as well when lying flat. You may periodically feel a strong urge to void with the catheter in place.  This is a bladder spasm and most often can occur when having a bowel movement or when you are moving around. It is typically self-limited and usually will stop after a few minutes.  You may use some Vaseline or Neosporin around the tip of the catheter to reduce friction  at the tip of the penis. Incisions: You may remove your dressing bandages the 2nd day after surgery.  You most likely will have a few small staples in each of the incisions and once the bandages are removed, the incisions may stay open to air.  You may start showering (not soaking or bathing in water ) 48 hours after surgery and the incisions simply need to be patted dry after the shower.  No additional care is needed. What to call us  about: You should call the office [phone]) if you develop fever > 101, persistent vomiting, or the catheter stops draining. Also, feel free to call with any other questions you may have and remember the handout that was provided to you as a reference preoperatively which answers many of the common questions that arise after surgery. You may resume aspirin, advil, aleve, vitamins, and supplements 7 days after surgery.

## 2023-11-17 NOTE — Interval H&P Note (Signed)
 History and Physical Interval Note:  11/17/2023 7:28 AM  Glenn Stewart Paras  has presented today for surgery, with the diagnosis of PROSTATE CANCER.  The various methods of treatment have been discussed with the patient and family. After consideration of risks, benefits and other options for treatment, the patient has consented to  Procedure(s) with comments: PROSTATECTOMY, RADICAL, ROBOT-ASSISTED, LAPAROSCOPIC (N/A) - ROBOTIC RADICAL PROSTATECTOMY AND BILATERAL PELVIC LYMPH NODE DISSECTION LYMPHADENECTOMY, PELVIS, ROBOT-ASSISTED (Bilateral) as a surgical intervention.  The patient's history has been reviewed, patient examined, no change in status, stable for surgery.  I have reviewed the patient's chart and labs.  Questions were answered to the patient's satisfaction.     Morene LELON Salines

## 2023-11-17 NOTE — Anesthesia Procedure Notes (Addendum)
 Procedure Name: Intubation Date/Time: 11/17/2023 7:47 AM  Performed by: Metta Andrea NOVAK, CRNAPre-anesthesia Checklist: Patient identified, Emergency Drugs available, Suction available, Patient being monitored and Timeout performed Patient Re-evaluated:Patient Re-evaluated prior to induction Oxygen Delivery Method: Circle system utilized Preoxygenation: Pre-oxygenation with 100% oxygen Induction Type: IV induction Ventilation: Mask ventilation without difficulty Laryngoscope Size: Mac and 4 Grade View: Grade II Tube type: Oral Tube size: 7.5 mm Number of attempts: 1 Airway Equipment and Method: Stylet Placement Confirmation: ETT inserted through vocal cords under direct vision, positive ETCO2 and breath sounds checked- equal and bilateral Secured at: 23 cm Tube secured with: Tape Dental Injury: Teeth and Oropharynx as per pre-operative assessment

## 2023-11-17 NOTE — H&P (Signed)
 Elevated PSA and prostate nodule   Today the patient is here for follow-up. At his last office visit I checked his prostate and felt a nodule. I checked his PSA subsequently and it was elevated. I recommended he return in 6 months with a repeat PSA. He has fairly minimal voiding symptoms. He continues to take tamsulosin  0.4 mg daily.   The patient does have a history of a large UPJ stone. It was 2 cm, and was treated in 2022. He tolerated this without issue.   Prostate cancer profile  Stage: T1c  PSA: 5.3  Biopsy 06/07/2023, Gleason 4+3 = 7 in 1 core in the right lateral base, 3 cores of Gleason 3+4 = 7 in the right base (1 core was 95%)  Prostate volume: 48 g   Prostate cancer nomogram after radical prostatectomy:  SVI-6%  LNI -8%  PFS (surgery)-57% at 4 years, 40% at 9 years   11/11/2023: Patient here today for preoperative appointment prior to undergoing robotic prostatectomy with bilateral pelvic lymph node dissection with Dr. Cam on 8/28. Overall doing well. He denies any changes in past medical history, prescription medications taken on daily basis, no interval surgical or procedural intervention. He denies any new or worsening lower urinary tract symptoms well-controlled with continued use of daily tamsulosin . He has had no interval dysuria, gross hematuria or interval treatment for UTI. He denies any recent chest pain, shortness of breath, nausea/vomiting or fever/chills.     ALLERGIES: Sulfa Drugs    MEDICATIONS: Metoprolol  Tartrate 25 MG Tablet  Tamsulosin  HCl 0.4 MG Capsule 1 capsule PO Daily  Alfuzosin HCl ER 10 MG Oral Tablet Extended Release 24 Hour 0 Oral  ALPRAZolam  1 MG Oral Tablet Oral  amLODIPine  Besylate 5 MG Tablet Oral  Aspirin  81 MG Tablet Delayed Release  Atorvastatin  Calcium  80 MG Tablet  Fish Oil  Multivitamin  Sertraline  HCl 100 MG Tablet Oral  Uroxatral 10 MG Oral Tablet Extended Release 24 Hour 0 Oral     GU PSH: Prostate Needle Biopsy -  06/07/2023 Ureteroscopic laser litho - 2022     NON-GU PSH: Surgical Pathology, Gross And Microscopic Examination For Prostate Needle - 06/07/2023 Visit Complexity (formerly GPC1X) - 06/13/2023, 04/14/2023     GU PMH: Prostate Cancer - 06/13/2023 Elevated PSA - 06/07/2023, - 04/14/2023 Encounter for Prostate Cancer screening - 12/07/2022 History of urolithiasis - 12/07/2022, (Stable), - 2022, Nephrolithiasis, - 2014 Ureteral calculus - 2022, Calculus of ureter, - 2014 Flank Pain - 2022 Dorsalgia, Unspec, Backache - 2014      PMH Notes:  2006-03-29 16:44:05 - Note: Anxiety  1898-03-22 00:00:00 - Note: Normal Routine History And Physical Adult   NON-GU PMH: Personal history of other endocrine, nutritional and metabolic disease, History of hypercholesterolemia - 2014 Personal history of other mental and behavioral disorders, History of depression - 2014    FAMILY HISTORY: No Family History    SOCIAL HISTORY: Marital Status: Single     Notes: Never A Smoker, Alcohol Use, Death In The Family Mother, Marital History - Single, Tobacco Use   REVIEW OF SYSTEMS:    GU Review Male:   Patient denies frequent urination, hard to postpone urination, burning/ pain with urination, get up at night to urinate, leakage of urine, stream starts and stops, trouble starting your stream, have to strain to urinate , erection problems, and penile pain.  Gastrointestinal (Upper):   Patient denies nausea, vomiting, and indigestion/ heartburn.  Gastrointestinal (Lower):   Patient denies diarrhea and constipation.  Constitutional:  Patient denies fever, night sweats, weight loss, and fatigue.  Skin:   Patient denies itching and skin rash/ lesion.  Eyes:   Patient denies blurred vision and double vision.  Ears/ Nose/ Throat:   Patient denies sore throat and sinus problems.  Hematologic/Lymphatic:   Patient denies swollen glands and easy bruising.  Cardiovascular:   Patient denies leg swelling and chest pains.   Respiratory:   Patient denies cough and shortness of breath.  Endocrine:   Patient denies excessive thirst.  Musculoskeletal:   Patient denies back pain and joint pain.  Neurological:   Patient denies headaches and dizziness.  Psychologic:   Patient denies depression and anxiety.   VITAL SIGNS:      11/11/2023 10:40 AM  BP 126/68 mmHg  Pulse 7 /min  Temperature 96.8 F / 36 C   MULTI-SYSTEM PHYSICAL EXAMINATION:    Constitutional: Well-nourished. No physical deformities. Normally developed. Good grooming.  Neck: Neck symmetrical, not swollen. Normal tracheal position.  Respiratory: No labored breathing, no use of accessory muscles.   Cardiovascular: Normal temperature, normal extremity pulses, no swelling, no varicosities.  Skin: No paleness, no jaundice, no cyanosis. No lesion, no ulcer, no rash.  Neurologic / Psychiatric: Oriented to time, oriented to place, oriented to person. No depression, no anxiety, no agitation.  Gastrointestinal: No mass, no tenderness, no rigidity, non obese abdomen.  Musculoskeletal: Normal gait and station of head and neck.     Complexity of Data:  Source Of History:  Patient, Medical Record Summary  Lab Test Review:   PSA  Records Review:   Pathology Reports, Previous Doctor Records, Previous Hospital Records, Previous Patient Records  Urine Test Review:   Urinalysis  X-Ray Review: PET- PSMA Scan: Reviewed Report.  MRI Prostate GSORAD: Reviewed Report.     03/03/23 12/07/22  PSA  Total PSA 5.83 ng/mL 5.09 ng/mL    11/11/23  Urinalysis  Urine Appearance Clear   Urine Color Yellow   Urine Glucose Neg mg/dL  Urine Bilirubin Neg mg/dL  Urine Ketones Neg mg/dL  Urine Specific Gravity 1.030   Urine Blood Neg ery/uL  Urine pH 5.5   Urine Protein Trace mg/dL  Urine Urobilinogen 0.2 mg/dL  Urine Nitrites Neg   Urine Leukocyte Esterase Neg leu/uL   PROCEDURES:          Visit Complexity - G2211          Urinalysis Dipstick Dipstick Cont'd   Color: Yellow Bilirubin: Neg mg/dL  Appearance: Clear Ketones: Neg mg/dL  Specific Gravity: 8.969 Blood: Neg ery/uL  pH: 5.5 Protein: Trace mg/dL  Glucose: Neg mg/dL Urobilinogen: 0.2 mg/dL    Nitrites: Neg    Leukocyte Esterase: Neg leu/uL    ASSESSMENT:      ICD-10 Details  1 GU:   Prostate Cancer - C61 Chronic, Threat to Bodily Function  2 NON-GU:   Encounter for other preprocedural examination - Z01.818 Undiagnosed New Problem   PLAN:           Orders Labs Urine Culture          Schedule Return Visit/Planned Activity: Keep Scheduled Appointment - Follow up MD, Schedule Surgery          Document Letter(s):  Created for Patient: Clinical Summary         Notes:   All questions answered to the best of my ability regarding the upcoming procedure and expected postoperative course with understanding expressed by the patient. Urine culture sent today to serve as a precautionary  baseline. He will proceed with previously scheduled robotic prostatectomy with Dr. Cam on 8/28.

## 2023-11-17 NOTE — Transfer of Care (Signed)
 Immediate Anesthesia Transfer of Care Note  Patient: Glenn Stewart  Procedure(s) Performed: PROSTATECTOMY, RADICAL, ROBOT-ASSISTED, LAPAROSCOPIC (Prostate) LYMPHADENECTOMY, PELVIS, ROBOT-ASSISTED (Bilateral: Pelvis)  Patient Location: PACU  Anesthesia Type:General  Level of Consciousness: awake, alert , and patient cooperative  Airway & Oxygen Therapy: Patient Spontanous Breathing and Patient connected to face mask oxygen  Post-op Assessment: Report given to RN and Post -op Vital signs reviewed and stable  Post vital signs: Reviewed and stable  Last Vitals:  Vitals Value Taken Time  BP 147/86 11/17/23 12:17  Temp    Pulse 72 11/17/23 12:22  Resp 30 11/17/23 12:22  SpO2 94 % 11/17/23 12:22  Vitals shown include unfiled device data.  Last Pain:  Vitals:   11/17/23 0637  TempSrc:   PainSc: 0-No pain         Complications: No notable events documented.

## 2023-11-17 NOTE — Anesthesia Postprocedure Evaluation (Signed)
 Anesthesia Post Note  Patient: Glenn Stewart  Procedure(s) Performed: PROSTATECTOMY, RADICAL, ROBOT-ASSISTED, LAPAROSCOPIC (Prostate) LYMPHADENECTOMY, PELVIS, ROBOT-ASSISTED (Bilateral: Pelvis)     Patient location during evaluation: PACU Anesthesia Type: General Level of consciousness: awake Pain management: pain level controlled Vital Signs Assessment: post-procedure vital signs reviewed and stable Respiratory status: spontaneous breathing, nonlabored ventilation and respiratory function stable Cardiovascular status: blood pressure returned to baseline and stable Postop Assessment: no apparent nausea or vomiting Anesthetic complications: no   No notable events documented.  Last Vitals:  Vitals:   11/17/23 1400 11/17/23 1502  BP: (!) 153/86 (!) 156/84  Pulse: 83 87  Resp: 16 18  Temp: 36.6 C 37.1 C  SpO2: 98% 97%    Last Pain:  Vitals:   11/17/23 1508  TempSrc:   PainSc: 7                  Delon Aisha Arch

## 2023-11-18 ENCOUNTER — Encounter (HOSPITAL_COMMUNITY): Payer: Self-pay | Admitting: Urology

## 2023-11-18 DIAGNOSIS — C61 Malignant neoplasm of prostate: Secondary | ICD-10-CM | POA: Diagnosis not present

## 2023-11-18 LAB — BASIC METABOLIC PANEL WITH GFR
Anion gap: 12 (ref 5–15)
BUN: 15 mg/dL (ref 6–20)
CO2: 22 mmol/L (ref 22–32)
Calcium: 8.3 mg/dL — ABNORMAL LOW (ref 8.9–10.3)
Chloride: 104 mmol/L (ref 98–111)
Creatinine, Ser: 0.91 mg/dL (ref 0.61–1.24)
GFR, Estimated: >60 mL/min (ref >60)
Glucose, Bld: 116 mg/dL — ABNORMAL HIGH (ref 70–99)
Potassium: 3.7 mmol/L (ref 3.5–5.1)
Sodium: 138 mmol/L (ref 135–145)

## 2023-11-18 LAB — HEMOGLOBIN AND HEMATOCRIT, BLOOD
HCT: 37.1 % — ABNORMAL LOW (ref 39.0–52.0)
Hemoglobin: 12.3 g/dL — ABNORMAL LOW (ref 13.0–17.0)

## 2023-11-18 NOTE — Progress Notes (Signed)
   11/18/23 0908  TOC Brief Assessment  Insurance and Status Reviewed  Patient has primary care physician No  Home environment has been reviewed Resides alone in single family home  Prior level of function: Independent with ADLs at baseline  Prior/Current Home Services No current home services  Social Drivers of Health Review SDOH reviewed no interventions necessary  Readmission risk has been reviewed Yes  Transition of care needs no transition of care needs at this time

## 2023-11-18 NOTE — Plan of Care (Signed)
  Problem: Education: Goal: Knowledge of the procedure and recovery process will improve Outcome: Progressing   Problem: Bowel/Gastric: Goal: Gastrointestinal status for postoperative course will improve Outcome: Progressing   Problem: Pain Management: Goal: General experience of comfort will improve Outcome: Progressing   Problem: Skin Integrity: Goal: Demonstration of wound healing without infection will improve Outcome: Progressing

## 2023-11-18 NOTE — Discharge Summary (Addendum)
 Date of admission: 11/17/2023  Date of discharge: 11/18/2023  Admission diagnosis: Prostate Cancer  Discharge diagnosis: Prostate Cancer  History and Physical: For full details, please see admission history and physical. Briefly, Glenn Stewart is a 58 y.o. gentleman with localized prostate cancer.  After discussing management/treatment options, Glenn Stewart elected to proceed with surgical treatment.  Hospital Course: Glenn Stewart was taken to the operating room on 11/17/2023 and underwent a robotic assisted laparoscopic radical prostatectomy. Glenn Stewart tolerated this procedure well and without complications. Postoperatively, Glenn Stewart was able to be transferred to a regular hospital room following recovery from anesthesia.  Glenn Stewart was able to begin ambulating the night of surgery. Glenn Stewart remained hemodynamically stable overnight.  Glenn Stewart had excellent urine output with appropriately minimal output from Glenn Stewart pelvic drain and Glenn Stewart pelvic drain was removed on POD #1.  Glenn Stewart was transitioned to oral pain medication, tolerated a clear liquid diet, and had met all discharge criteria and was able to be discharged home later on POD#1.  Physical exam on day of discharge NAD Vitals:   11/17/23 1843 11/17/23 2209 11/18/23 0309 11/18/23 0636  BP: 130/85 (!) 141/86 132/88 (!) 140/92  Pulse: 98 87 68 71  Resp: 18 18 20 20   Temp: 99.3 F (37.4 C) 98.6 F (37 C) 98 F (36.7 C) 98.5 F (36.9 C)  TempSrc: Oral Oral Oral Oral  SpO2: 96% 96% 96% 99%  Weight:      Height:        Intake/Output Summary (Last 24 hours) at 11/18/2023 1128 Last data filed at 11/18/2023 9360 Gross per 24 hour  Intake 3182.01 ml  Output 4925 ml  Net -1742.99 ml   Nonlabored breathing Abdomen is protuberant, soft, appropriately tender, incisions are clean/dry/intact Extremities are symmetric Foley catheter is secured to Glenn Stewart left leg and is draining clear yellow urine  Laboratory values:  Recent Labs    11/17/23 1246 11/18/23 0357  HGB 14.0 12.3*  HCT  44.9 37.1*    Disposition: Home  Discharge instruction: Glenn Stewart was instructed to be ambulatory but to refrain from heavy lifting, strenuous activity, or driving. Glenn Stewart was instructed on urethral catheter care.  Discharge medications:   Allergies as of 11/18/2023       Reactions   Sulfonamide Derivatives Hives        Medication List     STOP taking these medications    aspirin  EC 81 MG tablet   fish oil-omega-3 fatty acids 1000 MG capsule   multivitamin capsule   tamsulosin  0.4 MG Caps capsule Commonly known as: FLOMAX        TAKE these medications    acetaminophen  325 MG tablet Commonly known as: TYLENOL  Take 2 tablets (650 mg total) by mouth every 4 (four) hours as needed for moderate pain or headache.   ALPRAZolam  1 MG tablet Commonly known as: XANAX  Take 1 mg by mouth Once daily as needed for anxiety.   amLODipine  5 MG tablet Commonly known as: NORVASC  Take 1 tablet (5 mg total) by mouth daily.   atorvastatin  80 MG tablet Commonly known as: LIPITOR Take 1 tablet (80 mg total) by mouth daily.   ciprofloxacin  500 MG tablet Commonly known as: Cipro  Take 1 tablet (500 mg total) by mouth 2 (two) times daily. Start day prior to follow up appt for catheter removal   docusate sodium  100 MG capsule Commonly known as: COLACE Take 1 capsule (100 mg total) by mouth 2 (two) times daily.   isosorbide  mononitrate 30 MG 24 hr tablet  Commonly known as: IMDUR  Take 1 tablet (30 mg total) by mouth daily.   metoprolol  tartrate 25 MG tablet Commonly known as: LOPRESSOR  TAKE ONE TABLET BY MOUTH TWICE DAILY   pantoprazole  20 MG tablet Commonly known as: PROTONIX  Take 1 tablet (20 mg total) by mouth daily. What changed:  when to take this reasons to take this   sertraline  100 MG tablet Commonly known as: ZOLOFT  Take 100 mg by mouth daily.   traMADol  50 MG tablet Commonly known as: Ultram  Take 1-2 tablets (50-100 mg total) by mouth every 6 (six) hours as needed for  moderate pain (pain score 4-6) or severe pain (pain score 7-10).        Followup: Glenn Stewart will followup in 1 week for catheter removal and to discuss Glenn Stewart surgical pathology results.

## 2023-11-23 ENCOUNTER — Other Ambulatory Visit: Payer: Self-pay | Admitting: Student

## 2023-11-23 LAB — SURGICAL PATHOLOGY

## 2023-12-06 ENCOUNTER — Other Ambulatory Visit: Payer: Self-pay | Admitting: Cardiology

## 2023-12-06 ENCOUNTER — Other Ambulatory Visit: Payer: Self-pay | Admitting: Student

## 2023-12-06 DIAGNOSIS — I10 Essential (primary) hypertension: Secondary | ICD-10-CM

## 2023-12-06 NOTE — Telephone Encounter (Signed)
*  STAT* If patient is at the pharmacy, call can be transferred to refill team.   1. Which medications need to be refilled? (please list name of each medication and dose if known)   pantoprazole  (PROTONIX ) 20 MG tablet    4. Which pharmacy/location (including street and city if local pharmacy) is medication to be sent to?  [id number] - [place], [place] - [address]     5. Do they need a 30 day or 90 day supply? 90  Pt requesting refill from [place], [place] until he sees new PCP 12/27/23

## 2023-12-06 NOTE — Telephone Encounter (Signed)
 Pt is requesting a refill on non cardiac medication pantoprazole . This medication was prescribe in the hospital. Would Dr. Pietro like to refill this medication? Please address

## 2023-12-07 MED ORDER — PANTOPRAZOLE SODIUM 20 MG PO TBEC: 20.0000 mg | DELAYED_RELEASE_TABLET | Freq: Every day | ORAL | 0 refills | Status: DC

## 2023-12-07 MED ORDER — ISOSORBIDE MONONITRATE ER 30 MG PO TB24: 30.0000 mg | ORAL_TABLET | Freq: Every day | ORAL | 3 refills | Status: DC

## 2023-12-07 MED ORDER — AMLODIPINE BESYLATE 5 MG PO TABS: 5.0000 mg | ORAL_TABLET | Freq: Every day | ORAL | 3 refills | Status: AC

## 2023-12-07 MED ORDER — ATORVASTATIN CALCIUM 80 MG PO TABS: 80.0000 mg | ORAL_TABLET | Freq: Every day | ORAL | 3 refills | Status: AC

## 2023-12-09 NOTE — Telephone Encounter (Signed)
 Ok to provide a 30 day prescription with 0 refills to get him to PCP appointment.   Thanks!  Alanea Woolridge E Kimanh Templeman, PA-C 12/09/2023 5:37 PM

## 2023-12-16 ENCOUNTER — Emergency Department (HOSPITAL_COMMUNITY)

## 2023-12-16 ENCOUNTER — Inpatient Hospital Stay (HOSPITAL_COMMUNITY)
Admission: EM | Admit: 2023-12-16 | Discharge: 2024-01-02 | DRG: 698 | Disposition: A | Discharge status: Home / Self Care | Attending: Internal Medicine | Admitting: Internal Medicine

## 2023-12-16 ENCOUNTER — Other Ambulatory Visit: Payer: Self-pay

## 2023-12-16 ENCOUNTER — Inpatient Hospital Stay (HOSPITAL_COMMUNITY)

## 2023-12-16 ENCOUNTER — Encounter (HOSPITAL_COMMUNITY): Payer: Self-pay

## 2023-12-16 DIAGNOSIS — K567 Ileus, unspecified: Secondary | ICD-10-CM | POA: Diagnosis present

## 2023-12-16 DIAGNOSIS — I2583 Coronary atherosclerosis due to lipid rich plaque: Secondary | ICD-10-CM | POA: Diagnosis not present

## 2023-12-16 DIAGNOSIS — Z79899 Other long term (current) drug therapy: Secondary | ICD-10-CM

## 2023-12-16 DIAGNOSIS — E861 Hypovolemia: Secondary | ICD-10-CM | POA: Diagnosis present

## 2023-12-16 DIAGNOSIS — Y838 Other surgical procedures as the cause of abnormal reaction of the patient, or of later complication, without mention of misadventure at the time of the procedure: Secondary | ICD-10-CM | POA: Diagnosis present

## 2023-12-16 DIAGNOSIS — R188 Other ascites: Secondary | ICD-10-CM | POA: Diagnosis present

## 2023-12-16 DIAGNOSIS — N170 Acute kidney failure with tubular necrosis: Secondary | ICD-10-CM | POA: Diagnosis present

## 2023-12-16 DIAGNOSIS — R7401 Elevation of levels of liver transaminase levels: Secondary | ICD-10-CM | POA: Diagnosis present

## 2023-12-16 DIAGNOSIS — K651 Peritoneal abscess: Secondary | ICD-10-CM | POA: Diagnosis present

## 2023-12-16 DIAGNOSIS — J80 Acute respiratory distress syndrome: Secondary | ICD-10-CM | POA: Diagnosis present

## 2023-12-16 DIAGNOSIS — J9602 Acute respiratory failure with hypercapnia: Secondary | ICD-10-CM

## 2023-12-16 DIAGNOSIS — N9989 Other postprocedural complications and disorders of genitourinary system: Principal | ICD-10-CM | POA: Diagnosis present

## 2023-12-16 DIAGNOSIS — I472 Ventricular tachycardia, unspecified: Secondary | ICD-10-CM | POA: Diagnosis present

## 2023-12-16 DIAGNOSIS — Z8249 Family history of ischemic heart disease and other diseases of the circulatory system: Secondary | ICD-10-CM

## 2023-12-16 DIAGNOSIS — G9341 Metabolic encephalopathy: Secondary | ICD-10-CM | POA: Diagnosis present

## 2023-12-16 DIAGNOSIS — B9629 Other Escherichia coli [E. coli] as the cause of diseases classified elsewhere: Secondary | ICD-10-CM | POA: Diagnosis not present

## 2023-12-16 DIAGNOSIS — I120 Hypertensive chronic kidney disease with stage 5 chronic kidney disease or end stage renal disease: Secondary | ICD-10-CM | POA: Diagnosis present

## 2023-12-16 DIAGNOSIS — I480 Paroxysmal atrial fibrillation: Secondary | ICD-10-CM | POA: Diagnosis not present

## 2023-12-16 DIAGNOSIS — J159 Unspecified bacterial pneumonia: Secondary | ICD-10-CM | POA: Diagnosis present

## 2023-12-16 DIAGNOSIS — I2510 Atherosclerotic heart disease of native coronary artery without angina pectoris: Secondary | ICD-10-CM | POA: Diagnosis not present

## 2023-12-16 DIAGNOSIS — N179 Acute kidney failure, unspecified: Secondary | ICD-10-CM | POA: Diagnosis not present

## 2023-12-16 DIAGNOSIS — K56600 Partial intestinal obstruction, unspecified as to cause: Secondary | ICD-10-CM | POA: Diagnosis present

## 2023-12-16 DIAGNOSIS — K219 Gastro-esophageal reflux disease without esophagitis: Secondary | ICD-10-CM | POA: Diagnosis present

## 2023-12-16 DIAGNOSIS — K56609 Unspecified intestinal obstruction, unspecified as to partial versus complete obstruction: Secondary | ICD-10-CM

## 2023-12-16 DIAGNOSIS — E872 Acidosis, unspecified: Secondary | ICD-10-CM | POA: Diagnosis present

## 2023-12-16 DIAGNOSIS — B9689 Other specified bacterial agents as the cause of diseases classified elsewhere: Secondary | ICD-10-CM | POA: Diagnosis present

## 2023-12-16 DIAGNOSIS — Z992 Dependence on renal dialysis: Secondary | ICD-10-CM | POA: Diagnosis not present

## 2023-12-16 DIAGNOSIS — Z951 Presence of aortocoronary bypass graft: Secondary | ICD-10-CM | POA: Diagnosis not present

## 2023-12-16 DIAGNOSIS — Z6838 Body mass index (BMI) 38.0-38.9, adult: Secondary | ICD-10-CM | POA: Diagnosis not present

## 2023-12-16 DIAGNOSIS — E1122 Type 2 diabetes mellitus with diabetic chronic kidney disease: Secondary | ICD-10-CM | POA: Diagnosis present

## 2023-12-16 DIAGNOSIS — E66812 Obesity, class 2: Secondary | ICD-10-CM | POA: Diagnosis not present

## 2023-12-16 DIAGNOSIS — D631 Anemia in chronic kidney disease: Secondary | ICD-10-CM | POA: Diagnosis present

## 2023-12-16 DIAGNOSIS — L89300 Pressure ulcer of unspecified buttock, unstageable: Secondary | ICD-10-CM | POA: Insufficient documentation

## 2023-12-16 DIAGNOSIS — A419 Sepsis, unspecified organism: Secondary | ICD-10-CM | POA: Diagnosis present

## 2023-12-16 DIAGNOSIS — F319 Bipolar disorder, unspecified: Secondary | ICD-10-CM | POA: Diagnosis present

## 2023-12-16 DIAGNOSIS — I471 Supraventricular tachycardia, unspecified: Secondary | ICD-10-CM | POA: Diagnosis present

## 2023-12-16 DIAGNOSIS — I4891 Unspecified atrial fibrillation: Secondary | ICD-10-CM | POA: Diagnosis not present

## 2023-12-16 DIAGNOSIS — N390 Urinary tract infection, site not specified: Secondary | ICD-10-CM | POA: Diagnosis present

## 2023-12-16 DIAGNOSIS — E876 Hypokalemia: Secondary | ICD-10-CM | POA: Diagnosis present

## 2023-12-16 DIAGNOSIS — L89150 Pressure ulcer of sacral region, unstageable: Secondary | ICD-10-CM | POA: Diagnosis not present

## 2023-12-16 DIAGNOSIS — K8020 Calculus of gallbladder without cholecystitis without obstruction: Secondary | ICD-10-CM | POA: Diagnosis present

## 2023-12-16 DIAGNOSIS — Z833 Family history of diabetes mellitus: Secondary | ICD-10-CM

## 2023-12-16 DIAGNOSIS — F411 Generalized anxiety disorder: Secondary | ICD-10-CM | POA: Diagnosis present

## 2023-12-16 DIAGNOSIS — I2585 Chronic coronary microvascular dysfunction: Secondary | ICD-10-CM | POA: Diagnosis not present

## 2023-12-16 DIAGNOSIS — I25810 Atherosclerosis of coronary artery bypass graft(s) without angina pectoris: Secondary | ICD-10-CM | POA: Diagnosis not present

## 2023-12-16 DIAGNOSIS — I10 Essential (primary) hypertension: Secondary | ICD-10-CM | POA: Diagnosis present

## 2023-12-16 DIAGNOSIS — E8721 Acute metabolic acidosis: Secondary | ICD-10-CM | POA: Diagnosis not present

## 2023-12-16 DIAGNOSIS — J189 Pneumonia, unspecified organism: Secondary | ICD-10-CM

## 2023-12-16 DIAGNOSIS — B9561 Methicillin susceptible Staphylococcus aureus infection as the cause of diseases classified elsewhere: Secondary | ICD-10-CM | POA: Diagnosis present

## 2023-12-16 DIAGNOSIS — K769 Liver disease, unspecified: Secondary | ICD-10-CM | POA: Diagnosis present

## 2023-12-16 DIAGNOSIS — D649 Anemia, unspecified: Secondary | ICD-10-CM | POA: Diagnosis not present

## 2023-12-16 DIAGNOSIS — R0689 Other abnormalities of breathing: Principal | ICD-10-CM

## 2023-12-16 DIAGNOSIS — R531 Weakness: Secondary | ICD-10-CM | POA: Diagnosis present

## 2023-12-16 DIAGNOSIS — Z781 Physical restraint status: Secondary | ICD-10-CM

## 2023-12-16 DIAGNOSIS — N186 End stage renal disease: Secondary | ICD-10-CM | POA: Diagnosis present

## 2023-12-16 DIAGNOSIS — Z9079 Acquired absence of other genital organ(s): Secondary | ICD-10-CM

## 2023-12-16 DIAGNOSIS — E8809 Other disorders of plasma-protein metabolism, not elsewhere classified: Secondary | ICD-10-CM | POA: Diagnosis not present

## 2023-12-16 DIAGNOSIS — F909 Attention-deficit hyperactivity disorder, unspecified type: Secondary | ICD-10-CM | POA: Diagnosis present

## 2023-12-16 DIAGNOSIS — R578 Other shock: Secondary | ICD-10-CM | POA: Diagnosis not present

## 2023-12-16 DIAGNOSIS — E875 Hyperkalemia: Secondary | ICD-10-CM | POA: Diagnosis not present

## 2023-12-16 DIAGNOSIS — E7800 Pure hypercholesterolemia, unspecified: Secondary | ICD-10-CM | POA: Diagnosis present

## 2023-12-16 DIAGNOSIS — L89310 Pressure ulcer of right buttock, unstageable: Secondary | ICD-10-CM | POA: Diagnosis not present

## 2023-12-16 DIAGNOSIS — R32 Unspecified urinary incontinence: Secondary | ICD-10-CM | POA: Diagnosis not present

## 2023-12-16 DIAGNOSIS — R579 Shock, unspecified: Secondary | ICD-10-CM

## 2023-12-16 DIAGNOSIS — C61 Malignant neoplasm of prostate: Secondary | ICD-10-CM | POA: Diagnosis present

## 2023-12-16 DIAGNOSIS — R5381 Other malaise: Secondary | ICD-10-CM | POA: Diagnosis present

## 2023-12-16 DIAGNOSIS — R6521 Severe sepsis with septic shock: Secondary | ICD-10-CM | POA: Diagnosis present

## 2023-12-16 DIAGNOSIS — I898 Other specified noninfective disorders of lymphatic vessels and lymph nodes: Secondary | ICD-10-CM | POA: Diagnosis present

## 2023-12-16 DIAGNOSIS — H9190 Unspecified hearing loss, unspecified ear: Secondary | ICD-10-CM | POA: Diagnosis present

## 2023-12-16 DIAGNOSIS — I700 Atherosclerosis of aorta: Secondary | ICD-10-CM | POA: Diagnosis present

## 2023-12-16 DIAGNOSIS — Z87442 Personal history of urinary calculi: Secondary | ICD-10-CM

## 2023-12-16 DIAGNOSIS — Z8546 Personal history of malignant neoplasm of prostate: Secondary | ICD-10-CM

## 2023-12-16 DIAGNOSIS — K529 Noninfective gastroenteritis and colitis, unspecified: Secondary | ICD-10-CM | POA: Diagnosis present

## 2023-12-16 DIAGNOSIS — R000 Tachycardia, unspecified: Secondary | ICD-10-CM | POA: Diagnosis not present

## 2023-12-16 DIAGNOSIS — E871 Hypo-osmolality and hyponatremia: Secondary | ICD-10-CM | POA: Diagnosis present

## 2023-12-16 DIAGNOSIS — J9601 Acute respiratory failure with hypoxia: Secondary | ICD-10-CM | POA: Diagnosis not present

## 2023-12-16 DIAGNOSIS — L89320 Pressure ulcer of left buttock, unstageable: Secondary | ICD-10-CM | POA: Diagnosis not present

## 2023-12-16 DIAGNOSIS — N3289 Other specified disorders of bladder: Secondary | ICD-10-CM | POA: Diagnosis not present

## 2023-12-16 DIAGNOSIS — R031 Nonspecific low blood-pressure reading: Secondary | ICD-10-CM | POA: Diagnosis not present

## 2023-12-16 DIAGNOSIS — Z882 Allergy status to sulfonamides status: Secondary | ICD-10-CM

## 2023-12-16 DIAGNOSIS — R1084 Generalized abdominal pain: Secondary | ICD-10-CM

## 2023-12-16 LAB — BASIC METABOLIC PANEL WITH GFR
Anion gap: 16 — ABNORMAL HIGH (ref 5–15)
BUN: 66 mg/dL — ABNORMAL HIGH (ref 6–20)
CO2: 19 mmol/L — ABNORMAL LOW (ref 22–32)
Calcium: 6.9 mg/dL — ABNORMAL LOW (ref 8.9–10.3)
Chloride: 89 mmol/L — ABNORMAL LOW (ref 98–111)
Creatinine, Ser: 4.45 mg/dL — ABNORMAL HIGH (ref 0.61–1.24)
GFR, Estimated: 14 mL/min — ABNORMAL LOW (ref >60)
Glucose, Bld: 137 mg/dL — ABNORMAL HIGH (ref 70–99)
Potassium: 3.7 mmol/L (ref 3.5–5.1)
Sodium: 124 mmol/L — ABNORMAL LOW (ref 135–145)

## 2023-12-16 LAB — I-STAT CHEM 8, ED
BUN: 50 mg/dL — ABNORMAL HIGH (ref 6–20)
Calcium, Ion: 0.79 mmol/L — CL (ref 1.15–1.40)
Chloride: 96 mmol/L — ABNORMAL LOW (ref 98–111)
Creatinine, Ser: 4.1 mg/dL — ABNORMAL HIGH (ref 0.61–1.24)
Glucose, Bld: 114 mg/dL — ABNORMAL HIGH (ref 70–99)
HCT: 38 % — ABNORMAL LOW (ref 39.0–52.0)
Hemoglobin: 12.9 g/dL — ABNORMAL LOW (ref 13.0–17.0)
Potassium: 2.8 mmol/L — ABNORMAL LOW (ref 3.5–5.1)
Sodium: 130 mmol/L — ABNORMAL LOW (ref 135–145)
TCO2: 17 mmol/L — ABNORMAL LOW (ref 22–32)

## 2023-12-16 LAB — MRSA NEXT GEN BY PCR, NASAL: MRSA by PCR Next Gen: NOT DETECTED

## 2023-12-16 LAB — HIV ANTIBODY (ROUTINE TESTING W REFLEX): HIV Screen 4th Generation wRfx: NONREACTIVE

## 2023-12-16 LAB — I-STAT ARTERIAL BLOOD GAS, ED
Acid-base deficit: 6 mmol/L — ABNORMAL HIGH (ref 0.0–2.0)
Bicarbonate: 22.3 mmol/L (ref 20.0–28.0)
Calcium, Ion: 0.97 mmol/L — ABNORMAL LOW (ref 1.15–1.40)
HCT: 37 % — ABNORMAL LOW (ref 39.0–52.0)
Hemoglobin: 12.6 g/dL — ABNORMAL LOW (ref 13.0–17.0)
O2 Saturation: 92 %
Patient temperature: 97.9
Potassium: 3.4 mmol/L — ABNORMAL LOW (ref 3.5–5.1)
Sodium: 123 mmol/L — ABNORMAL LOW (ref 135–145)
TCO2: 24 mmol/L (ref 22–32)
pCO2 arterial: 54.3 mmHg — ABNORMAL HIGH (ref 32–48)
pH, Arterial: 7.219 — ABNORMAL LOW (ref 7.35–7.45)
pO2, Arterial: 74 mmHg — ABNORMAL LOW (ref 83–108)

## 2023-12-16 LAB — COMPREHENSIVE METABOLIC PANEL WITH GFR
ALT: 37 U/L (ref 0–44)
AST: 68 U/L — ABNORMAL HIGH (ref 15–41)
Albumin: 1.6 g/dL — ABNORMAL LOW (ref 3.5–5.0)
Alkaline Phosphatase: 69 U/L (ref 38–126)
Anion gap: 17 — ABNORMAL HIGH (ref 5–15)
BUN: 55 mg/dL — ABNORMAL HIGH (ref 6–20)
CO2: 16 mmol/L — ABNORMAL LOW (ref 22–32)
Calcium: 6.3 mg/dL — CL (ref 8.9–10.3)
Chloride: 96 mmol/L — ABNORMAL LOW (ref 98–111)
Creatinine, Ser: 3.92 mg/dL — ABNORMAL HIGH (ref 0.61–1.24)
GFR, Estimated: 17 mL/min — ABNORMAL LOW (ref >60)
Glucose, Bld: 118 mg/dL — ABNORMAL HIGH (ref 70–99)
Potassium: 2.8 mmol/L — ABNORMAL LOW (ref 3.5–5.1)
Sodium: 129 mmol/L — ABNORMAL LOW (ref 135–145)
Total Bilirubin: 3.4 mg/dL — ABNORMAL HIGH (ref 0.0–1.2)
Total Protein: 5.2 g/dL — ABNORMAL LOW (ref 6.5–8.1)

## 2023-12-16 LAB — APTT: aPTT: 29 s (ref 24–36)

## 2023-12-16 LAB — POCT I-STAT 7, (LYTES, BLD GAS, ICA,H+H)
Acid-base deficit: 6 mmol/L — ABNORMAL HIGH (ref 0.0–2.0)
Bicarbonate: 20.4 mmol/L (ref 20.0–28.0)
Calcium, Ion: 0.95 mmol/L — ABNORMAL LOW (ref 1.15–1.40)
HCT: 35 % — ABNORMAL LOW (ref 39.0–52.0)
Hemoglobin: 11.9 g/dL — ABNORMAL LOW (ref 13.0–17.0)
O2 Saturation: 97 %
Patient temperature: 36.6
Potassium: 4 mmol/L (ref 3.5–5.1)
Sodium: 121 mmol/L — ABNORMAL LOW (ref 135–145)
TCO2: 22 mmol/L (ref 22–32)
pCO2 arterial: 41.3 mmHg (ref 32–48)
pH, Arterial: 7.301 — ABNORMAL LOW (ref 7.35–7.45)
pO2, Arterial: 102 mmHg (ref 83–108)

## 2023-12-16 LAB — CBC
HCT: 37.1 % — ABNORMAL LOW (ref 39.0–52.0)
Hemoglobin: 12.6 g/dL — ABNORMAL LOW (ref 13.0–17.0)
MCH: 27.1 pg (ref 26.0–34.0)
MCHC: 34 g/dL (ref 30.0–36.0)
MCV: 79.8 fL — ABNORMAL LOW (ref 80.0–100.0)
Platelets: 324 K/uL (ref 150–400)
RBC: 4.65 MIL/uL (ref 4.22–5.81)
RDW: 13.7 % (ref 11.5–15.5)
WBC: 6 K/uL (ref 4.0–10.5)
nRBC: 5.7 % — ABNORMAL HIGH (ref 0.0–0.2)

## 2023-12-16 LAB — I-STAT CG4 LACTIC ACID, ED
Lactic Acid, Venous: 3.3 mmol/L (ref 0.5–1.9)
Lactic Acid, Venous: 3.5 mmol/L (ref 0.5–1.9)

## 2023-12-16 LAB — CBG MONITORING, ED: Glucose-Capillary: 137 mg/dL — ABNORMAL HIGH (ref 70–99)

## 2023-12-16 LAB — GLUCOSE, CAPILLARY
Glucose-Capillary: 157 mg/dL — ABNORMAL HIGH (ref 70–99)
Glucose-Capillary: 136 mg/dL — ABNORMAL HIGH (ref 70–99)
Glucose-Capillary: 133 mg/dL — ABNORMAL HIGH (ref 70–99)

## 2023-12-16 LAB — PROTIME-INR
INR: 1.2 (ref 0.8–1.2)
Prothrombin Time: 16.3 s — ABNORMAL HIGH (ref 11.4–15.2)

## 2023-12-16 LAB — LACTIC ACID, PLASMA
Lactic Acid, Venous: 1.2 mmol/L (ref 0.5–1.9)
Lactic Acid, Venous: 1.3 mmol/L (ref 0.5–1.9)

## 2023-12-16 LAB — PHOSPHORUS: Phosphorus: 7.3 mg/dL — ABNORMAL HIGH (ref 2.5–4.6)

## 2023-12-16 LAB — MAGNESIUM: Magnesium: 2.3 mg/dL (ref 1.7–2.4)

## 2023-12-16 LAB — TROPONIN I (HIGH SENSITIVITY)
Troponin I (High Sensitivity): 82 ng/L — ABNORMAL HIGH (ref <18)
Troponin I (High Sensitivity): 95 ng/L — ABNORMAL HIGH (ref <18)

## 2023-12-16 LAB — TRIGLYCERIDES: Triglycerides: 145 mg/dL (ref <150)

## 2023-12-16 MED ORDER — SODIUM CHLORIDE 0.9 % IV SOLN: INTRAVENOUS | Status: AC | PRN

## 2023-12-16 MED ORDER — PHENYLEPHRINE HCL (PRESSORS) 10 MG/ML IV SOLN
INTRAVENOUS | Status: DC | PRN
  Administered 2023-12-16: 80 ug

## 2023-12-16 MED ORDER — AMIODARONE IV BOLUS ONLY 150 MG/100ML
INTRAVENOUS | Status: DC | PRN
Start: 2023-12-16 — End: 2023-12-16
  Administered 2023-12-16 (×2): 150 mg via INTRAVENOUS

## 2023-12-16 MED ORDER — PANTOPRAZOLE SODIUM 40 MG IV SOLR
40.0000 mg | Freq: Every day | INTRAVENOUS | Status: DC
  Administered 2023-12-16 – 2023-12-24 (×9): 40 mg via INTRAVENOUS
  Filled 2023-12-16 (×9): qty 10

## 2023-12-16 MED ORDER — NOREPINEPHRINE 16 MG/250ML-% IV SOLN
0.0000 ug/min | INTRAVENOUS | Status: DC
  Administered 2023-12-16: 2 ug/min via INTRAVENOUS
  Filled 2023-12-16: qty 250

## 2023-12-16 MED ORDER — MAGNESIUM SULFATE 2 GM/50ML IV SOLN
2.0000 g | Freq: Once | INTRAVENOUS | Status: AC
  Administered 2023-12-16: 2 g via INTRAVENOUS

## 2023-12-16 MED ORDER — FENTANYL CITRATE PF 50 MCG/ML IJ SOSY
PREFILLED_SYRINGE | INTRAMUSCULAR | Status: AC
  Filled 2023-12-16: qty 1

## 2023-12-16 MED ORDER — IOHEXOL 300 MG/ML  SOLN
50.0000 mL | Freq: Once | INTRAMUSCULAR | Status: AC | PRN
  Administered 2023-12-16: 50 mL via INTRATHECAL

## 2023-12-16 MED ORDER — PIPERACILLIN-TAZOBACTAM 3.375 G IVPB
3.3750 g | Freq: Once | INTRAVENOUS | Status: AC
  Administered 2023-12-16: 3.375 g via INTRAVENOUS
  Filled 2023-12-16: qty 50

## 2023-12-16 MED ORDER — FENTANYL CITRATE PF 50 MCG/ML IJ SOSY: 25.0000 ug | PREFILLED_SYRINGE | Freq: Once | INTRAMUSCULAR | Status: DC

## 2023-12-16 MED ORDER — ORAL CARE MOUTH RINSE: 15.0000 mL | OROMUCOSAL | Status: DC | PRN

## 2023-12-16 MED ORDER — PROPOFOL 1000 MG/100ML IV EMUL
0.0000 ug/kg/min | INTRAVENOUS | Status: DC
  Administered 2023-12-16: 10 ug/kg/min via INTRAVENOUS
  Administered 2023-12-17 (×2): 20 ug/kg/min via INTRAVENOUS
  Administered 2023-12-17: 30 ug/kg/min via INTRAVENOUS
  Administered 2023-12-18 (×2): 25 ug/kg/min via INTRAVENOUS
  Administered 2023-12-18: 30 ug/kg/min via INTRAVENOUS
  Administered 2023-12-18: 20 ug/kg/min via INTRAVENOUS
  Administered 2023-12-19 (×7): 40 ug/kg/min via INTRAVENOUS
  Administered 2023-12-20: 45 ug/kg/min via INTRAVENOUS
  Administered 2023-12-20: 40 ug/kg/min via INTRAVENOUS
  Filled 2023-12-16 (×13): qty 100
  Filled 2023-12-16: qty 200
  Filled 2023-12-16 (×2): qty 100

## 2023-12-16 MED ORDER — IOHEXOL 350 MG/ML SOLN
75.0000 mL | Freq: Once | INTRAVENOUS | Status: AC | PRN
  Administered 2023-12-16: 75 mL via INTRAVENOUS

## 2023-12-16 MED ORDER — POTASSIUM CHLORIDE 10 MEQ/50ML IV SOLN
10.0000 meq | INTRAVENOUS | Status: DC
  Administered 2023-12-16: 10 meq via INTRAVENOUS
  Filled 2023-12-16 (×3): qty 50

## 2023-12-16 MED ORDER — LINEZOLID 600 MG/300ML IV SOLN
600.0000 mg | Freq: Two times a day (BID) | INTRAVENOUS | Status: DC
  Administered 2023-12-17 – 2023-12-19 (×5): 600 mg via INTRAVENOUS
  Filled 2023-12-16 (×5): qty 300

## 2023-12-16 MED ORDER — AMIODARONE HCL IN DEXTROSE 360-4.14 MG/200ML-% IV SOLN
30.0000 mg/h | INTRAVENOUS | Status: DC
  Administered 2023-12-17 – 2023-12-25 (×17): 30 mg/h via INTRAVENOUS
  Filled 2023-12-16 (×17): qty 200

## 2023-12-16 MED ORDER — LACTATED RINGERS IV SOLN: INTRAVENOUS | Status: DC

## 2023-12-16 MED ORDER — PHENYLEPHRINE 80 MCG/ML (10ML) SYRINGE FOR IV PUSH (FOR BLOOD PRESSURE SUPPORT)
PREFILLED_SYRINGE | INTRAVENOUS | Status: DC | PRN
  Administered 2023-12-16: 160 ug via INTRAVENOUS

## 2023-12-16 MED ORDER — CHLORHEXIDINE GLUCONATE CLOTH 2 % EX PADS
6.0000 | MEDICATED_PAD | Freq: Every day | CUTANEOUS | Status: DC
  Administered 2023-12-16 – 2023-12-17 (×2): 6 via TOPICAL

## 2023-12-16 MED ORDER — AMIODARONE LOAD VIA INFUSION
150.0000 mg | Freq: Once | INTRAVENOUS | Status: DC
  Filled 2023-12-16: qty 83.34

## 2023-12-16 MED ORDER — DOCUSATE SODIUM 50 MG/5ML PO LIQD
100.0000 mg | Freq: Two times a day (BID) | ORAL | Status: DC
Start: 2023-12-16 — End: 2023-12-18
  Administered 2023-12-16 – 2023-12-17 (×2): 100 mg
  Filled 2023-12-16 (×2): qty 10

## 2023-12-16 MED ORDER — LACTATED RINGERS IV BOLUS: 1000.0000 mL | Freq: Once | INTRAVENOUS | Status: DC

## 2023-12-16 MED ORDER — FENTANYL CITRATE (PF) 100 MCG/2ML IJ SOLN
INTRAMUSCULAR | Status: DC | PRN
  Administered 2023-12-16: 50 ug via INTRAVENOUS

## 2023-12-16 MED ORDER — ROCURONIUM BROMIDE 10 MG/ML (PF) SYRINGE
PREFILLED_SYRINGE | INTRAVENOUS | Status: DC | PRN
  Administered 2023-12-16: 100 mg via INTRAVENOUS

## 2023-12-16 MED ORDER — ORAL CARE MOUTH RINSE
15.0000 mL | OROMUCOSAL | Status: DC
  Administered 2023-12-16 – 2023-12-25 (×95): 15 mL via OROMUCOSAL

## 2023-12-16 MED ORDER — PIPERACILLIN-TAZOBACTAM 3.375 G IVPB
3.3750 g | Freq: Three times a day (TID) | INTRAVENOUS | Status: DC
  Administered 2023-12-17 (×2): 3.375 g via INTRAVENOUS
  Filled 2023-12-16 (×2): qty 50

## 2023-12-16 MED ORDER — AMIODARONE HCL IN DEXTROSE 360-4.14 MG/200ML-% IV SOLN
60.0000 mg/h | INTRAVENOUS | Status: DC
Start: 2023-12-16 — End: 2023-12-16
  Administered 2023-12-16 (×2): 60 mg/h via INTRAVENOUS
  Filled 2023-12-16: qty 200

## 2023-12-16 MED ORDER — VANCOMYCIN HCL 1750 MG/350ML IV SOLN
1750.0000 mg | Freq: Once | INTRAVENOUS | Status: AC
  Administered 2023-12-16: 1750 mg via INTRAVENOUS
  Filled 2023-12-16: qty 350

## 2023-12-16 MED ORDER — FENTANYL 2500MCG IN NS 250ML (10MCG/ML) PREMIX INFUSION
0.0000 ug/h | INTRAVENOUS | Status: DC
  Administered 2023-12-16: 100 ug/h via INTRAVENOUS
  Administered 2023-12-17: 125 ug/h via INTRAVENOUS
  Administered 2023-12-18: 200 ug/h via INTRAVENOUS
  Administered 2023-12-18: 175 ug/h via INTRAVENOUS
  Administered 2023-12-19: 225 ug/h via INTRAVENOUS
  Administered 2023-12-19: 250 ug/h via INTRAVENOUS
  Administered 2023-12-19: 300 ug/h via INTRAVENOUS
  Administered 2023-12-20: 250 ug/h via INTRAVENOUS
  Administered 2023-12-20: 350 ug/h via INTRAVENOUS
  Filled 2023-12-16 (×9): qty 250

## 2023-12-16 MED ORDER — NOREPINEPHRINE 4 MG/250ML-% IV SOLN
0.0000 ug/min | INTRAVENOUS | Status: DC
  Administered 2023-12-16: 5 ug/min via INTRAVENOUS

## 2023-12-16 MED ORDER — POLYETHYLENE GLYCOL 3350 17 G PO PACK
17.0000 g | PACK | Freq: Every day | ORAL | Status: DC
  Administered 2023-12-16: 17 g
  Filled 2023-12-16: qty 1

## 2023-12-16 MED ORDER — FENTANYL BOLUS VIA INFUSION
25.0000 ug | INTRAVENOUS | Status: DC | PRN
  Administered 2023-12-16 – 2023-12-20 (×20): 100 ug via INTRAVENOUS

## 2023-12-16 MED ORDER — LACTATED RINGERS IV BOLUS
1000.0000 mL | Freq: Once | INTRAVENOUS | Status: AC
Start: 2023-12-16 — End: 2023-12-16
  Administered 2023-12-16: 1000 mL via INTRAVENOUS

## 2023-12-16 MED ORDER — ETOMIDATE 2 MG/ML IV SOLN
INTRAVENOUS | Status: DC | PRN
Start: 2023-12-16 — End: 2023-12-16
  Administered 2023-12-16: 30 mg via INTRAVENOUS

## 2023-12-16 MED ORDER — ORAL CARE MOUTH RINSE: 15.0000 mL | OROMUCOSAL | Status: DC

## 2023-12-16 MED ORDER — POTASSIUM CHLORIDE 10 MEQ/100ML IV SOLN
10.0000 meq | INTRAVENOUS | Status: AC
  Administered 2023-12-16 (×3): 10 meq via INTRAVENOUS
  Filled 2023-12-16: qty 100

## 2023-12-16 MED ORDER — SODIUM CHLORIDE 0.9 % IV SOLN
INTRAVENOUS | Status: DC | PRN
  Administered 2023-12-16: 1000 mL via INTRAVENOUS

## 2023-12-16 MED ORDER — CALCIUM GLUCONATE-NACL 1-0.675 GM/50ML-% IV SOLN
1.0000 g | Freq: Once | INTRAVENOUS | Status: AC
  Administered 2023-12-16: 1000 mg via INTRAVENOUS
  Filled 2023-12-16: qty 50

## 2023-12-16 NOTE — Progress Notes (Signed)
 Evaluated the patient at bedside after I received handoff from dayshift regarding patient's concerning physical exam. 58 year old male here with small bowel obstruction, intra-abdominal abscesses.  Recent radical prostatectomy on 11/14/2023.  He had OG-tube placed in the emergency department which has returned feculent material.  He is on broad-spectrum antibiotics.  He is currently not on pressor support.  He is on high vent settings with FiO2 100%, PEEP of 14, peak 34, plat 24.  Suspect his high vent settings are related to his intra-abdominal pressure.  His abdomen is distended and tender.  He is currently getting an arterial line and to go for CT cystogram.  Dr. Teresa with general surgery has placed an IR order for possible drainage of intra-abdominal abscesses.  Urology is aware of patient. Lactic has cleared. Based on his last chemistry, would likely benefit from bicarbonate administration, however he is getting potassium replacement and then will give bicarb push.  Getting calcium  replaced.  Watching creatinine closely.  Depending on CT scan results, will need consideration for CRRT versus surgical intervention.  Dr. Maree is aware of patient.  Will monitor closely.  Tinnie FORBES Furth, PA-C [facility] 12/16/23 8:42 PM  Please see [email or web address] for pager details.  From 7A-7P if no response, please call [phone] After hours, please call ELink [phone]

## 2023-12-16 NOTE — ED Provider Notes (Addendum)
 [place] EMERGENCY DEPARTMENT AT [facility] Provider Note   CSN: [id number] Arrival date & time: 12/16/23  1500     Patient presents with: Loss of Consciousness   Glenn Stewart is a 58 y.o. male.    Loss of Consciousness 58 year old male with PMH of prostate cancer status post robotic assisted laparoscopic radical prostatectomy 11/17/2023, CAD status post CABG x 5 (LIMA-LAD, left radial-OM1, SVG-D1, sequential SVG-OM-PLA) in 04/2022, post-op atrial fibrillation (no longer on anticoagulation)), mild bilateral carotid stenosis, HTN, HLD, and bipolar disorder presenting to the ED via EMS from urology clinic today with waxing/waning consciousness in the setting of atrial fibrillation with RVR -EMS reports that patient has been in and out of consciousness with variable rate narrow complex tachycardia believed to be SVT versus atrial fibrillation.  EMS reports that they placed patient on NRB, but denies any other prehospital interventions.  Per patient, he has been feeling generally unwell.   History limited in the setting of acute critical illness requiring synchronized cardioversion and RSI.      Prior to Admission medications   Medication Sig Start Date End Date Taking? Authorizing Provider  acetaminophen  (TYLENOL ) 325 MG tablet Take 2 tablets (650 mg total) by mouth every 4 (four) hours as needed for moderate pain or headache. 05/19/22   Zimmerman, Donielle M, PA-C  ALPRAZolam  (XANAX ) 1 MG tablet Take 1 mg by mouth Once daily as needed for anxiety. 04/17/11   [provider]  amLODipine  (NORVASC ) 5 MG tablet Take 1 tablet (5 mg total) by mouth daily. 12/07/23   Pietro Redell RAMAN, MD  atorvastatin  (LIPITOR) 80 MG tablet Take 1 tablet (80 mg total) by mouth daily. 12/07/23   Pietro Redell RAMAN, MD  ciprofloxacin  (CIPRO ) 500 MG tablet Take 1 tablet (500 mg total) by mouth 2 (two) times daily. Start day prior to follow up appt for catheter removal 11/17/23   Cory Palma,  PA-C  docusate sodium  (COLACE) 100 MG capsule Take 1 capsule (100 mg total) by mouth 2 (two) times daily. 11/17/23   Cory Palma, PA-C  isosorbide  mononitrate (IMDUR ) 30 MG 24 hr tablet Take 1 tablet (30 mg total) by mouth daily. 12/07/23   Pietro Redell RAMAN, MD  metoprolol  tartrate (LOPRESSOR ) 25 MG tablet TAKE ONE TABLET BY MOUTH TWICE DAILY 11/23/23   Goodrich, Callie E, PA-C  pantoprazole  (PROTONIX ) 20 MG tablet Take 1 tablet (20 mg total) by mouth daily. 12/07/23 01/06/24  Pietro Redell RAMAN, MD  sertraline  (ZOLOFT ) 100 MG tablet Take 100 mg by mouth daily.    [provider]  traMADol  (ULTRAM ) 50 MG tablet Take 1-2 tablets (50-100 mg total) by mouth every 6 (six) hours as needed for moderate pain (pain score 4-6) or severe pain (pain score 7-10). 11/17/23   Cory Palma, PA-C    Allergies: Sulfonamide derivatives    Review of Systems  Cardiovascular:  Positive for syncope.    Updated Vital Signs BP 125/76   Pulse (!) 113   Temp (!) 96.3 F (35.7 C)   Resp 19   Ht 5' 5.98 (1.676 m)   SpO2 96%   BMI 36.53 kg/m   Physical Exam Constitutional:      Appearance: Normal appearance. He is ill-appearing and diaphoretic.  HENT:     Head: Normocephalic and atraumatic.     Nose: Nose normal.     Mouth/Throat:     Mouth: Mucous membranes are moist.     Pharynx: Oropharynx is clear.  Eyes:  Extraocular Movements: Extraocular movements intact.     Conjunctiva/sclera: Conjunctivae normal.     Pupils: Pupils are equal, round, and reactive to light.  Cardiovascular:     Rate and Rhythm: Tachycardia present. Rhythm irregular.     Pulses: Normal pulses.     Heart sounds: Normal heart sounds.  Abdominal:     General: Abdomen is flat. There is distension.  Musculoskeletal:        General: Normal range of motion.  Skin:    General: Skin is warm.     Capillary Refill: Capillary refill takes 2 to 3 seconds.  Neurological:     Mental Status: He is alert and oriented to person,  place, and time.     (all labs ordered are listed, but only abnormal results are displayed) Labs Reviewed  COMPREHENSIVE METABOLIC PANEL WITH GFR - Abnormal; Notable for the following components:      Result Value   Sodium 129 (*)    Potassium 2.8 (*)    Chloride 96 (*)    CO2 16 (*)    Glucose, Bld 118 (*)    BUN 55 (*)    Creatinine, Ser 3.92 (*)    Calcium  6.3 (*)    Total Protein 5.2 (*)    Albumin  1.6 (*)    AST 68 (*)    Total Bilirubin 3.4 (*)    GFR, Estimated 17 (*)    Anion gap 17 (*)    All other components within normal limits  CBC - Abnormal; Notable for the following components:   Hemoglobin 12.6 (*)    HCT 37.1 (*)    MCV 79.8 (*)    nRBC 5.7 (*)    All other components within normal limits  PROTIME-INR - Abnormal; Notable for the following components:   Prothrombin Time 16.3 (*)    All other components within normal limits  PHOSPHORUS - Abnormal; Notable for the following components:   Phosphorus 7.3 (*)    All other components within normal limits  I-STAT CG4 LACTIC ACID, ED - Abnormal; Notable for the following components:   Lactic Acid, Venous 3.3 (*)    All other components within normal limits  I-STAT CHEM 8, ED - Abnormal; Notable for the following components:   Sodium 130 (*)    Potassium 2.8 (*)    Chloride 96 (*)    BUN 50 (*)    Creatinine, Ser 4.10 (*)    Glucose, Bld 114 (*)    Calcium , Ion 0.79 (*)    TCO2 17 (*)    Hemoglobin 12.9 (*)    HCT 38.0 (*)    All other components within normal limits  I-STAT CG4 LACTIC ACID, ED - Abnormal; Notable for the following components:   Lactic Acid, Venous 3.5 (*)    All other components within normal limits  TROPONIN I (HIGH SENSITIVITY) - Abnormal; Notable for the following components:   Troponin I (High Sensitivity) 82 (*)    All other components within normal limits  CULTURE, RESPIRATORY W GRAM STAIN  APTT  MAGNESIUM   TRIGLYCERIDES  BLOOD GAS, ARTERIAL  RAPID URINE DRUG SCREEN, HOSP  PERFORMED  URINALYSIS, W/ REFLEX TO CULTURE (INFECTION SUSPECTED)  I-STAT CG4 LACTIC ACID, ED    EKG: None  Radiology: [facility] Chest Port 1 View Result Date: 12/16/2023 CLINICAL DATA:  [id number] Encounter for central line placement [id number] EXAM: PORTABLE CHEST - 1 VIEW COMPARISON:  12/16/2023, 07/16/2023 FINDINGS: Similarly positioned endotracheal and esophagogastric tubes. Interval placement of a right  IJ approach central venous catheter, which terminates at the cavoatrial junction. Improved aeration of the lungs. Retrocardiac airspace opacities. No pleural effusion or pneumothorax. No cardiomegaly. Sternotomy wires and CABG markers. Defibrillator pads. IMPRESSION: 1. Interval placement of a right IJ approach central venous catheter, which terminates at the cavoatrial junction. No pneumothorax. 2. Improved aeration of the lungs with retrocardiac airspace opacities, possibly atelectasis or aspiration. Electronically Signed   By: Rogelia Myers M.D.   On: 12/16/2023 16:28   DG Chest Port 1 View Result Date: 12/16/2023 CLINICAL DATA:  Central line and ET tube placement EXAM: PORTABLE CHEST - 1 VIEW COMPARISON:  July 16, 2023 FINDINGS: Endotracheal tube terminates in the mid trachea. Esophagogastric tube terminates in the region of the stomach. Overlying defibrillator pads. Markedly low lung volumes. No focal airspace consolidation, pleural effusion, or pneumothorax. No cardiomegaly. IMPRESSION: 1. Well-positioned endotracheal and esophagogastric tubes. No pneumothorax. 2. Markedly low lung volumes.  No pneumonia or pleural effusion. Electronically Signed   By: Rogelia Myers M.D.   On: 12/16/2023 16:26     Procedure Name: Intubation Date/Time: 12/16/2023 5:06 PM  Performed by: Dorcus Fallow, MDPre-anesthesia Checklist: Patient identified, Emergency Drugs available, Suction available, Timeout performed and Patient being monitored Oxygen Delivery Method: Ambu bag Preoxygenation: Pre-oxygenation with 100%  oxygen Induction Type: Rapid sequence Ventilation: Two handed mask ventilation required Laryngoscope Size: Mac and 4 Grade View: Grade II Tube type: Subglottic suction tube Tube size: 7.5 mm Number of attempts: 1 Airway Equipment and Method: Patient positioned with wedge pillow Placement Confirmation: ETT inserted through vocal cords under direct vision and Positive ETCO2 Secured at: 25 cm Difficulty Due To: Difficulty was anticipated    [employer]  Date/Time: 12/16/2023 5:08 PM  Performed by: Dorcus Fallow, MD Authorized by: Cottie Donnice PARAS, MD   Consent:    Consent obtained:  Verbal   Consent given by:  Patient   Risks discussed:  Incorrect placement, arterial puncture, infection, pneumothorax and nerve damage   Alternatives discussed:  Delayed treatment Universal protocol:    Procedure explained and questions answered to patient or proxy's satisfaction: yes     Relevant documents present and verified: yes     Test results available: yes     Imaging studies available: yes     Required blood products, implants, devices, and special equipment available: yes     Site/side marked: yes     Immediately prior to procedure, a time out was called: yes     Patient identity confirmed:  Arm band and hospital-assigned identification number Pre-procedure details:    Indication(s): central venous access     Hand hygiene: Hand hygiene performed prior to insertion     Sterile barrier technique: All elements of maximal sterile technique followed     Skin preparation:  Chlorhexidine  with alcohol and chlorhexidine    Skin preparation agent: Skin preparation agent completely dried prior to procedure   Sedation:    Sedation type:  Deep Procedure details:    Location:  R internal jugular   Patient position:  Supine   Procedural supplies:  Triple lumen   Landmarks identified: yes     Ultrasound guidance: yes     Ultrasound guidance timing: real time     Sterile ultrasound techniques:  Sterile gel and sterile probe covers were used     Number of attempts:  1   Successful placement: yes   Post-procedure details:    Post-procedure:  Dressing applied   Assessment:  Blood return through all ports,  free fluid flow, placement verified by x-ray and no pneumothorax on x-ray   Procedure completion:  Tolerated well, no immediate complications    Medications Ordered in the ED  fentaNYL  (SUBLIMAZE ) injection (50 mcg Intravenous Given 12/16/23 1524)  amiodarone  (NEXTERONE ) 1.5 mg/mL IV bolus only (0 mg Intravenous Stopped 12/16/23 1558)  fentaNYL  (SUBLIMAZE ) injection 25-50 mcg ( Intravenous Not Given 12/16/23 1607)  fentaNYL  in NS (81mcg/ml) infusion-PREMIX (100 mcg/hr Intravenous New Bag/Given 12/16/23 1606)  fentaNYL  (SUBLIMAZE ) bolus via infusion 25-100 mcg (has no administration in time range)  amiodarone  (NEXTERONE ) 1.8 mg/mL load via infusion 150 mg (150 mg Intravenous Not Given 12/16/23 1600)    Followed by  amiodarone  (NEXTERONE  PREMIX) 360-4.14 MG/200ML-% (1.8 mg/mL) IV infusion (60 mg/hr Intravenous New Bag/Given 12/16/23 1600)    Followed by  amiodarone  (NEXTERONE  PREMIX) 360-4.14 MG/200ML-% (1.8 mg/mL) IV infusion (has no administration in time range)  phenylephrine  (NEO-SYNEPHRINE) injection (80 mcg  Given 12/16/23 1533)  PHENYLephrine  80 mcg/ml in normal saline Adult IV Push Syringe (For Blood Pressure Support) (160 mcg Intravenous Given 12/16/23 1535)  etomidate  (AMIDATE ) injection (30 mg Intravenous Given 12/16/23 1536)  rocuronium  (ZEMURON ) injection (100 mg Intravenous Given 12/16/23 1537)  0.9 %  sodium chloride  infusion (0 mLs Intravenous Stopped 12/16/23 1558)  norepinephrine  (LEVOPHED ) 4mg  in (0.016 mg/mL) premix infusion (5 mcg/min Intravenous New Bag/Given 12/16/23 1603)  vancomycin  (VANCOREADY) IVPB 1750 mg/350 mL (1,750 mg Intravenous New Bag/Given 12/16/23 1615)  potassium chloride  10 mEq in 50 mL *CENTRAL LINE* IVPB (has no administration in time  range)  magnesium  sulfate IVPB 2 g 50 mL (0 g Intravenous Stopped 12/16/23 1602)  piperacillin -tazobactam (ZOSYN ) IVPB 3.375 g (0 g Intravenous Stopped 12/16/23 1602)  iohexol  (OMNIPAQUE ) 350 MG/ML injection 75 mL (75 mLs Intravenous Contrast Given 12/16/23 1659)    Clinical Course as of 12/16/23 1710  Fri Dec 16, 2023  1621 Patient intubated, HR 100's and intermittently NSR and A Fib, BP stable on low-dose levophed .  He remains hypoxic on vent with Peep now 12, FIO2 100%, ppressures around 30, xray shows low lung volume but no PTX.  High concern for thrombus/PE - will proceed with contrasted CT scan, including abdomen which appears rigid on exam. [MT]  1654 Critical care consulted [MT]  1706 In brief this is a 58 year old male with a history of significant coronary disease and a prostatectomy 1 month ago. [MT]    Clinical Course User Index [MT] Trifan, Donnice PARAS, MD                                 Medical Decision Making Amount and/or Complexity of Data Reviewed Labs: ordered. Radiology: ordered.  Risk Prescription drug management.   58 year old male presenting to the ED via EMS from urology clinic with nonspecific symptoms of feeling generally unwell in setting of recent intra-abdominal procedure 08/28.  Per EMS, patient with narrow complex variable tachycardia and nonsustained runs of ventricular tachycardia without prehospital intervention.  Upon arrival, patient fatigued and diaphoretic, unwell appearing with narrow complex tachycardia 110-170, mentating appropriately with GCS 15, with MAP greater than 65.  Patient promptly placed on telemetry with SpO2 monitoring and AP pad placement.  In the setting of acute critical illness, IO attempted in left lower extremity without successful placement.  Subsequently removed.  Symmetric pulses bilateral upper and lower extremities.  Abdomen tense, distended, diffusely tender with concern for acute peritonitis.  Patient given 150 mg  amiodarone   bolus, followed by subsequent 150 mg p.o. via cardiology recommendations.  Single synchronized cardioversion performed for sustained ventricular tachycardia with transient resolution.  2 g IV mag given in 20 minutes.  Concern of impending respiratory insufficiency with respiratory rate 40-60 breaths/min, patient RSI, see procedure note.  Patient subsequently placed on Levophed  gtt fixed at 5.   Central line placed.  OG placed patient initiated on Zosyn  and vancomycin .  Confirmatory chest x-ray performed.  Bedside CXR without evidence of subdiaphragmatic free air.  Patient transported to scanner without event.  Scan reviewed without evidence of perforation on preliminary read.  Discussed with MICU team, patient to be admitted to their service for ongoing medical resuscitation.  At this time, favored etiology is SBP in the setting of anasarca/peritoneal fluid and generalized abdominal tenderness.  Final diagnoses:  Respiratory insufficiency  Generalized abdominal pain  Shock (HCC)  Atrial fibrillation with RVR [facility])    ED Discharge Orders     None          Iyesha Such, Elsie, MD 12/16/23 1705    Dorcus Elsie, MD 12/16/23 1710    Cottie Donnice PARAS, MD 12/17/23 1231

## 2023-12-16 NOTE — Progress Notes (Signed)
 Pharmacy Antibiotic Note  Glenn Stewart is a 58 y.o. male admitted on 12/16/2023 with concerns for small bowel obstruction perforation and abscess.  Pharmacy has been consulted for vancomycin  and Zosyn  dosing. Patient's baseline renal function appears ~1, currently up at 4.1. Discussed with NP, changing antibiotics to linezolid /Zosyn .   Plan: Linezolid  600mg  IV Q12h Zosyn  3.375g IV Q8h  Trend WBC, fever, renal function F/u cultures, clinical progress, levels as indicated De-escalate when able   Height: 5' 5.98 (167.6 cm) Weight: 103.8 kg (228 lb 13.4 oz) IBW/kg (Calculated) : 63.76  Temp (24hrs), Avg:97.8 F (36.6 C), Min:96.3 F (35.7 C), Max:98.7 F (37.1 C)  Recent Labs  Lab 12/16/23 1524 12/16/23 1535 12/16/23 1538 12/16/23 1600  WBC 6.0  --   --   --   CREATININE 3.92* 4.10*  --   --   LATICACIDVEN  --   --  3.3* 3.5*    Estimated Creatinine Clearance: 21.9 mL/min (A) (by C-G formula based on SCr of 4.1 mg/dL (H)).    Allergies  Allergen Reactions   Sulfonamide Derivatives Hives   Thank you for allowing pharmacy to be a part of this patient's care.  Shelba Collier, PharmD, BCPS Clinical Pharmacist

## 2023-12-16 NOTE — Progress Notes (Signed)
 Ct cystogram performed.  Dr. Watt with urology and Dr. Teresa with [facility] have been updated and reviewed CT scan. Dr. Watt requests con't foley for now. No other interventions. Dr. Teresa plan still for IR consult for abscess drainage.   Tinnie FORBES Furth, PA-C [facility] 12/16/23 11:36 PM  Please see [email or web address] for pager details.  From 7A-7P if no response, please call [phone] After hours, please call ELink [phone]

## 2023-12-16 NOTE — ED Triage Notes (Signed)
 Pt was at urology appt and was in and out of consciousness. Severe abd pain on right side. Abd distention. Pt arrives in afib RVR. Pts O2 was in 60's. BP 140s.

## 2023-12-16 NOTE — Consult Note (Signed)
 CC/Reason for consult: Intra-abdominal fluid collections, prostatectomy 11/17/23  Requesting physician: Donnice Hughes, MD  HPI: TRISTIN VANDEUSEN is an 58 y.o. male hx CAD (s/p CABG), afib, HTN, HLD, arrhythmias, was directed to the ER by the urology service earlier today.  The patient is currently intubated and unable to participate in any questions.  Numerous attempts have been made by the ER doctor as well as our service to reach out to his family but so far have been unsuccessful.  I have been able to discuss the case with Dr. Watt however.  Workup in the emergency department noted ventricular tachycardia with cardioversion and subsequent intubation.  A Foley catheter was placed by the care team in the ER prior to our evaluation.  By report, he has at least 3 to 4-day history of malaise.  He underwent workup in the emergency department was found to have multiple intradural fluid collections.  He did undergo prostatectomy with lymph node dissection 11/17/2023.   Past Medical History:  Diagnosis Date   ADHD (attention deficit hyperactivity disorder)    Anxiety state, unspecified    Bipolar 1 disorder (HCC)    Cancer (HCC)    CHEST PAIN 12/04/2008   Qualifier: Diagnosis of  By: [name] MD, [name]    Coronary artery disease    Depressive disorder, not elsewhere classified    Dermatophytosis of nail    Esophageal reflux    History of kidney stones    HYPERGLYCEMIA 07/10/2007   Qualifier: Diagnosis of  By: [name] MD, [name]    Low back pain 09/18/2013   Other chronic nonalcoholic liver disease    Pre-diabetes    Problems with hearing    Prostatitis, unspecified    Pure hypercholesterolemia    Screen for STD (sexually transmitted disease) 04/20/2011   Sprain of cruciate ligament of knee    Tear of medial cartilage or meniscus of knee, current    TMJ (temporomandibular joint disorder) 12/25/2010   Unspecified essential hypertension    Unspecified gastritis and gastroduodenitis  without mention of hemorrhage    Urethritis, unspecified     Past Surgical History:  Procedure Laterality Date   CORONARY ARTERY BYPASS GRAFT N/A 05/12/2022   Procedure: CORONARY ARTERY BYPASS GRAFTING (CABG) X FIVE BYPASSES USING OPEN LEFT INTERNAL MAMMARY ARTERY, OPEN LEFT RADIAL ARTERY, AND ENDOSCOPIC RIGHT GREATER SAPHENOUS VEIN HARVEST.;  Surgeon: [name], MD;  Location: [facility] OR;  Service: Open Heart Surgery;  Laterality: N/A;   CYSTOSCOPY W/ LITHOLAPAXY / EHL     LEFT HEART CATH AND CORONARY ANGIOGRAPHY N/A 05/10/2022   Procedure: LEFT HEART CATH AND CORONARY ANGIOGRAPHY;  Surgeon: [name], MD;  Location: [facility] INVASIVE CV LAB;  Service: Cardiovascular;  Laterality: N/A;   No prior surgery     RADIAL ARTERY HARVEST Left 05/12/2022   Procedure: RADIAL ARTERY HARVEST;  Surgeon: [name], MD;  Location: [facility] OR;  Service: Open Heart Surgery;  Laterality: Left;   ROBOT ASSISTED LAPAROSCOPIC RADICAL PROSTATECTOMY N/A 11/17/2023   Procedure: PROSTATECTOMY, RADICAL, ROBOT-ASSISTED, LAPAROSCOPIC;  Surgeon: [name], MD;  Location: [facility] ORS;  Service: Urology;  Laterality: N/A;  ROBOTIC RADICAL PROSTATECTOMY AND BILATERAL PELVIC LYMPH NODE DISSECTION   TEE WITHOUT CARDIOVERSION N/A 05/12/2022   Procedure: TRANSESOPHAGEAL ECHOCARDIOGRAM;  Surgeon: [name], MD;  Location: [facility] OR;  Service: Open Heart Surgery;  Laterality: N/A;    Family History  Problem Relation Age of Onset   Diabetes Father    Hypertension Father  Heart failure Father     Social:  reports that he has never smoked. He has never used smokeless tobacco. He reports current alcohol use. He reports that he does not use drugs.  Allergies:  Allergies  Allergen Reactions   Sulfonamide Derivatives Hives    Medications: I have reviewed the patient's current medications.  Results for orders placed or performed during the hospital encounter of 12/16/23 (from the past 48  hours)  Comprehensive metabolic panel     Status: Abnormal   Collection Time: 12/16/23  3:24 PM  Result Value Ref Range   Sodium 129 (L) 135 - 145 mmol/L   Potassium 2.8 (L) 3.5 - 5.1 mmol/L   Chloride 96 (L) 98 - 111 mmol/L   CO2 16 (L) 22 - 32 mmol/L   Glucose, Bld 118 (H) 70 - 99 mg/dL    Comment: Glucose reference range applies only to samples taken after fasting for at least 8 hours.   BUN 55 (H) 6 - 20 mg/dL   Creatinine, Ser 6.07 (H) 0.61 - 1.24 mg/dL   Calcium  6.3 (LL) 8.9 - 10.3 mg/dL    Comment: CRITICAL RESULT CALLED TO, READ BACK BY AND VERIFIED WITH V. VALDEZ RN @1633  ON 12/16/23 MAB   Total Protein 5.2 (L) 6.5 - 8.1 g/dL   Albumin  1.6 (L) 3.5 - 5.0 g/dL   AST 68 (H) 15 - 41 U/L   ALT 37 0 - 44 U/L   Alkaline Phosphatase 69 38 - 126 U/L   Total Bilirubin 3.4 (H) 0.0 - 1.2 mg/dL   GFR, Estimated 17 (L) >60 mL/min    Comment: (NOTE) Calculated using the CKD-EPI Creatinine Equation (2021)    Anion gap 17 (H) 5 - 15    Comment: Performed at [facility], [address] [place], [place] [address]  CBC     Status: Abnormal   Collection Time: 12/16/23  3:24 PM  Result Value Ref Range   WBC 6.0 4.0 - 10.5 K/uL   RBC 4.65 4.22 - 5.81 MIL/uL   Hemoglobin 12.6 (L) 13.0 - 17.0 g/dL   HCT 62.8 (L) 60.9 - 47.9 %   MCV 79.8 (L) 80.0 - 100.0 fL   MCH 27.1 26.0 - 34.0 pg   MCHC 34.0 30.0 - 36.0 g/dL   RDW 86.2 88.4 - 84.4 %   Platelets 324 150 - 400 K/uL   nRBC 5.7 (H) 0.0 - 0.2 %    Comment: Performed at [facility], [address] [place], [place] [address]  APTT     Status: None   Collection Time: 12/16/23  3:24 PM  Result Value Ref Range   aPTT 29 24 - 36 seconds    Comment: Performed at [facility], [address], [place], [place] [address]  Protime-INR     Status: Abnormal   Collection Time: 12/16/23  3:24 PM  Result Value Ref Range   Prothrombin Time 16.3 (H) 11.4 - 15.2 seconds   INR 1.2 0.8 - 1.2    Comment: (NOTE) INR goal varies based  on device and disease states. Performed at [facility], [address] [place], [place] [address]   Magnesium      Status: None   Collection Time: 12/16/23  3:24 PM  Result Value Ref Range   Magnesium  2.3 1.7 - 2.4 mg/dL    Comment: Performed at [facility], [address], [place], [place] [address]  Phosphorus  Status: Abnormal   Collection Time: 12/16/23  3:24 PM  Result Value Ref Range   Phosphorus 7.3 (H) 2.5 - 4.6 mg/dL    Comment: Performed at [facility], [address], [place], [place] [address]  Troponin I (High Sensitivity)     Status: Abnormal   Collection Time: 12/16/23  3:24 PM  Result Value Ref Range   Troponin I (High Sensitivity) 82 (H) <18 ng/L    Comment: (NOTE) Elevated high sensitivity troponin I (hsTnI) values and significant  changes across serial measurements may suggest ACS but many other  chronic and acute conditions are known to elevate hsTnI results.  Refer to the Links section for chest pain algorithms and additional  guidance. Performed at [facility], [address] [place], [place] [address]   Triglycerides     Status: None   Collection Time: 12/16/23  3:26 PM  Result Value Ref Range   Triglycerides 145 <150 mg/dL    Comment: Performed at [facility], [address], [place], [place] [address]  I-stat chem 8, ED (not at [facility], [facility] or [facility])     Status: Abnormal   Collection Time: 12/16/23  3:35 PM  Result Value Ref Range   Sodium 130 (L) 135 - 145 mmol/L   Potassium 2.8 (L) 3.5 - 5.1 mmol/L   Chloride 96 (L) 98 - 111 mmol/L   BUN 50 (H) 6 - 20 mg/dL   Creatinine, Ser 5.89 (H) 0.61 - 1.24 mg/dL   Glucose, Bld 885 (H) 70 - 99 mg/dL    Comment: Glucose reference range applies only to samples taken after fasting for at least 8 hours.   Calcium , Ion 0.79 (LL) 1.15 - 1.40 mmol/L   TCO2 17 (L) 22 - 32 mmol/L   Hemoglobin 12.9 (L) 13.0 - 17.0 g/dL   HCT 61.9 (L) 60.9 - 47.9 %   Comment NOTIFIED PHYSICIAN    I-Stat Lactic Acid, ED     Status: Abnormal   Collection Time: 12/16/23  3:38 PM  Result Value Ref Range   Lactic Acid, Venous 3.3 (HH) 0.5 - 1.9 mmol/L   Comment NOTIFIED PHYSICIAN   I-Stat CG4 Lactic Acid     Status: Abnormal   Collection Time: 12/16/23  4:00 PM  Result Value Ref Range   Lactic Acid, Venous 3.5 (HH) 0.5 - 1.9 mmol/L   Comment NOTIFIED PHYSICIAN   I-Stat arterial blood gas, ED     Status: Abnormal   Collection Time: 12/16/23  5:10 PM  Result Value Ref Range   pH, Arterial 7.219 (L) 7.35 - 7.45   pCO2 arterial 54.3 (H) 32 - 48 mmHg   pO2, Arterial 74 (L) 83 - 108 mmHg   Bicarbonate 22.3 20.0 - 28.0 mmol/L   TCO2 24 22 - 32 mmol/L   O2 Saturation 92 %   Acid-base deficit 6.0 (H) 0.0 - 2.0 mmol/L   Sodium 123 (L) 135 - 145 mmol/L   Potassium 3.4 (L) 3.5 - 5.1 mmol/L   Calcium , Ion 0.97 (L) 1.15 - 1.40 mmol/L   HCT 37.0 (L) 39.0 - 52.0 %   Hemoglobin 12.6 (L) 13.0 - 17.0 g/dL   Patient temperature 02.0 F    Sample type ARTERIAL   CBG monitoring, ED     Status: Abnormal   Collection Time: 12/16/23  5:20 PM  Result Value Ref Range   Glucose-Capillary 137 (H) 70 - 99 mg/dL    Comment: Glucose reference range applies only  to samples taken after fasting for at least 8 hours.  Glucose, capillary     Status: Abnormal   Collection Time: 12/16/23  6:24 PM  Result Value Ref Range   Glucose-Capillary 157 (H) 70 - 99 mg/dL    Comment: Glucose reference range applies only to samples taken after fasting for at least 8 hours.    CT Angio Chest Pulmonary Embolism (PE) W or WO Contrast Result Date: 12/16/2023 CLINICAL DATA:  Aortic aneurysm suspected. Severe right-sided abdominal pain and abdominal distention. Decreased oxygen saturation. EXAM: CT ANGIOGRAPHY CHEST CT ABDOMEN AND PELVIS WITH CONTRAST TECHNIQUE: Multidetector CT imaging of the chest was performed using the standard protocol during bolus administration of intravenous contrast. Multiplanar CT image reconstructions  and MIPs were obtained to evaluate the vascular anatomy. Multidetector CT imaging of the abdomen and pelvis was performed using the standard protocol during bolus administration of intravenous contrast. RADIATION DOSE REDUCTION: This exam was performed according to the departmental dose-optimization program which includes automated exposure control, adjustment of the mA and/or kV according to patient size and/or use of iterative reconstruction technique. CONTRAST:  75mL OMNIPAQUE  IOHEXOL  350 MG/ML SOLN COMPARISON:  Chest radiograph 12/16/2023.  PET-CT 06/23/2023 FINDINGS: CTA CHEST FINDINGS Cardiovascular: Cardiac enlargement. No pericardial effusions. Normal caliber thoracic aorta. No aortic dissection. Calcification of the aorta and coronary arteries. Great vessel origins are patent. Postoperative changes consistent with coronary bypass. No evidence of significant pulmonary embolus. Mediastinum/Nodes: Enteric tube with tip in the stomach. Esophagus is decompressed. Endotracheal tube with tip above the carina. Thyroid gland is unremarkable. Scattered mediastinal lymph nodes are not pathologically enlarged, likely reactive. Lungs/Pleura: Consolidation or atelectasis in both lung bases, possibly pneumonia, compressive atelectasis, or aspiration. Small bilateral pleural effusions. No pneumothorax. Musculoskeletal: Degenerative changes in the spine. Sternotomy wires. No acute bony abnormalities. Review of the MIP images confirms the above findings. CT ABDOMEN and PELVIS FINDINGS Hepatobiliary: No focal liver lesions. Cholelithiasis with several stones in the gallbladder. No wall thickening or inflammatory stranding. No bile duct dilatation. Pancreas: Unremarkable. No pancreatic ductal dilatation or surrounding inflammatory changes. Spleen: Normal in size without focal abnormality. Adrenals/Urinary Tract: Left adrenal gland nodule measuring 2.1 cm diameter. Sixty-five Hounsfield unit density measurement. The lesions are  unchanged since prior unenhanced study from 12/18/2020 at which time the density measurement was -3 Hounsfield units. This is consistent with a benign fat containing adenoma and no imaging follow-up is indicated. Kidneys are symmetrical and homogeneous. No hydronephrosis or hydroureter. Bladder is decompressed. Stomach/Bowel: Stomach is unremarkable. Colon is decompressed. Wall thickening of the ascending colon and hepatic flexure, new since prior study. This may indicate colitis or inflammatory bowel disease versus reactive change. Dilated fluid-filled small bowel with air-fluid levels and mild diffuse wall thickening. Terminal ileum is decompressed. This is consistent with small-bowel obstruction, possibly due to inflammatory strictures. Vascular/Lymphatic: Aortic atherosclerosis. No enlarged abdominal or pelvic lymph nodes. Reproductive: Prostate is unremarkable. Other: Diffuse infiltration throughout the right-sided abdominal and pelvic mesenteric fat. Multiple loculated fluid collections are demonstrated throughout the right abdomen and pelvis. A right pelvic collection measures 7.4 cm diameter. A right anterior lower quadrant collection measures 8.8 cm diameter. A right anterior para psoas collection measures 9.4 cm diameter. Multiple smaller right upper quadrant collections are demonstrated. Some collections contains small gas bubbles. This appearance is likely indicate multiple abscesses, possibly due to perforation. Could also consider a perforated appendicitis. Small amount of free fluid in the abdomen, likely ascites or reactive fluid. No free intra-abdominal gas. Musculoskeletal: Degenerative changes  in the spine. No acute bony abnormalities. Review of the MIP images confirms the above findings. IMPRESSION: 1. Fluid-filled distended small bowel with decompressed terminal ileum consistent with small-bowel obstruction. 2. Irregular wall thickening diffusely throughout the distal small bowel and right colon  suggesting enterocolitis or inflammatory bowel disease. Inflammatory strictures may be present. 3. Multiple loculated fluid collections, some with gas, demonstrated in the right abdomen and pelvis indicating abscesses. This is likely due to perforation, possibly related to inflammatory bowel disease or to an occult perforated appendicitis. The appendix is self is not demonstrated. No free intra-abdominal air. 4. Small amount of free fluid in the abdomen is likely reactive. 5. Cholelithiasis without evidence of acute cholecystitis. 6. Aortic atherosclerosis. 7. Consolidation versus compressive atelectasis in both lung bases. Small bilateral pleural effusions. Critical Value/emergent results were called by telephone at the time of interpretation on 12/16/2023 at 5:28 pm to provider MATTHEW TRIFAN , who verbally acknowledged these results. Electronically Signed   By: Elsie Gravely M.D.   On: 12/16/2023 17:33   CT ABDOMEN PELVIS W CONTRAST Result Date: 12/16/2023 CLINICAL DATA:  Aortic aneurysm suspected. Severe right-sided abdominal pain and abdominal distention. Decreased oxygen saturation. EXAM: CT ANGIOGRAPHY CHEST CT ABDOMEN AND PELVIS WITH CONTRAST TECHNIQUE: Multidetector CT imaging of the chest was performed using the standard protocol during bolus administration of intravenous contrast. Multiplanar CT image reconstructions and MIPs were obtained to evaluate the vascular anatomy. Multidetector CT imaging of the abdomen and pelvis was performed using the standard protocol during bolus administration of intravenous contrast. RADIATION DOSE REDUCTION: This exam was performed according to the departmental dose-optimization program which includes automated exposure control, adjustment of the mA and/or kV according to patient size and/or use of iterative reconstruction technique. CONTRAST:  75mL OMNIPAQUE  IOHEXOL  350 MG/ML SOLN COMPARISON:  Chest radiograph 12/16/2023.  PET-CT 06/23/2023 FINDINGS: CTA CHEST  FINDINGS Cardiovascular: Cardiac enlargement. No pericardial effusions. Normal caliber thoracic aorta. No aortic dissection. Calcification of the aorta and coronary arteries. Great vessel origins are patent. Postoperative changes consistent with coronary bypass. No evidence of significant pulmonary embolus. Mediastinum/Nodes: Enteric tube with tip in the stomach. Esophagus is decompressed. Endotracheal tube with tip above the carina. Thyroid gland is unremarkable. Scattered mediastinal lymph nodes are not pathologically enlarged, likely reactive. Lungs/Pleura: Consolidation or atelectasis in both lung bases, possibly pneumonia, compressive atelectasis, or aspiration. Small bilateral pleural effusions. No pneumothorax. Musculoskeletal: Degenerative changes in the spine. Sternotomy wires. No acute bony abnormalities. Review of the MIP images confirms the above findings. CT ABDOMEN and PELVIS FINDINGS Hepatobiliary: No focal liver lesions. Cholelithiasis with several stones in the gallbladder. No wall thickening or inflammatory stranding. No bile duct dilatation. Pancreas: Unremarkable. No pancreatic ductal dilatation or surrounding inflammatory changes. Spleen: Normal in size without focal abnormality. Adrenals/Urinary Tract: Left adrenal gland nodule measuring 2.1 cm diameter. Sixty-five Hounsfield unit density measurement. The lesions are unchanged since prior unenhanced study from 12/18/2020 at which time the density measurement was -3 Hounsfield units. This is consistent with a benign fat containing adenoma and no imaging follow-up is indicated. Kidneys are symmetrical and homogeneous. No hydronephrosis or hydroureter. Bladder is decompressed. Stomach/Bowel: Stomach is unremarkable. Colon is decompressed. Wall thickening of the ascending colon and hepatic flexure, new since prior study. This may indicate colitis or inflammatory bowel disease versus reactive change. Dilated fluid-filled small bowel with air-fluid  levels and mild diffuse wall thickening. Terminal ileum is decompressed. This is consistent with small-bowel obstruction, possibly due to inflammatory strictures. Vascular/Lymphatic: Aortic atherosclerosis. No enlarged  abdominal or pelvic lymph nodes. Reproductive: Prostate is unremarkable. Other: Diffuse infiltration throughout the right-sided abdominal and pelvic mesenteric fat. Multiple loculated fluid collections are demonstrated throughout the right abdomen and pelvis. A right pelvic collection measures 7.4 cm diameter. A right anterior lower quadrant collection measures 8.8 cm diameter. A right anterior para psoas collection measures 9.4 cm diameter. Multiple smaller right upper quadrant collections are demonstrated. Some collections contains small gas bubbles. This appearance is likely indicate multiple abscesses, possibly due to perforation. Could also consider a perforated appendicitis. Small amount of free fluid in the abdomen, likely ascites or reactive fluid. No free intra-abdominal gas. Musculoskeletal: Degenerative changes in the spine. No acute bony abnormalities. Review of the MIP images confirms the above findings. IMPRESSION: 1. Fluid-filled distended small bowel with decompressed terminal ileum consistent with small-bowel obstruction. 2. Irregular wall thickening diffusely throughout the distal small bowel and right colon suggesting enterocolitis or inflammatory bowel disease. Inflammatory strictures may be present. 3. Multiple loculated fluid collections, some with gas, demonstrated in the right abdomen and pelvis indicating abscesses. This is likely due to perforation, possibly related to inflammatory bowel disease or to an occult perforated appendicitis. The appendix is self is not demonstrated. No free intra-abdominal air. 4. Small amount of free fluid in the abdomen is likely reactive. 5. Cholelithiasis without evidence of acute cholecystitis. 6. Aortic atherosclerosis. 7. Consolidation  versus compressive atelectasis in both lung bases. Small bilateral pleural effusions. Critical Value/emergent results were called by telephone at the time of interpretation on 12/16/2023 at 5:28 pm to provider MATTHEW TRIFAN , who verbally acknowledged these results. Electronically Signed   By: Elsie Gravely M.D.   On: 12/16/2023 17:33   CT HEAD WO CONTRAST Result Date: 12/16/2023 CLINICAL DATA:  Altered mental status. EXAM: CT HEAD WITHOUT CONTRAST TECHNIQUE: Contiguous axial images were obtained from the base of the skull through the vertex without intravenous contrast. RADIATION DOSE REDUCTION: This exam was performed according to the departmental dose-optimization program which includes automated exposure control, adjustment of the mA and/or kV according to patient size and/or use of iterative reconstruction technique. COMPARISON:  May 10, 2022 FINDINGS: Brain: No evidence of acute infarction, hemorrhage, hydrocephalus, extra-axial collection or mass lesion/mass effect. There are areas of mildly decreased attenuation within the Jejuan Scala matter tracts of the supratentorial brain, consistent with microvascular disease changes. Vascular: No hyperdense vessel or unexpected calcification. Skull: Normal. Negative for fracture or focal lesion. Sinuses/Orbits: No acute finding. Other: None. IMPRESSION: No acute intracranial pathology. Electronically Signed   By: Suzen Dials M.D.   On: 12/16/2023 17:17   DG Chest Port 1 View Result Date: 12/16/2023 CLINICAL DATA:  [id number] Encounter for central line placement [id number] EXAM: PORTABLE CHEST - 1 VIEW COMPARISON:  12/16/2023, 07/16/2023 FINDINGS: Similarly positioned endotracheal and esophagogastric tubes. Interval placement of a right IJ approach central venous catheter, which terminates at the cavoatrial junction. Improved aeration of the lungs. Retrocardiac airspace opacities. No pleural effusion or pneumothorax. No cardiomegaly. Sternotomy wires and CABG  markers. Defibrillator pads. IMPRESSION: 1. Interval placement of a right IJ approach central venous catheter, which terminates at the cavoatrial junction. No pneumothorax. 2. Improved aeration of the lungs with retrocardiac airspace opacities, possibly atelectasis or aspiration. Electronically Signed   By: Rogelia Myers M.D.   On: 12/16/2023 16:28   DG Chest Port 1 View Result Date: 12/16/2023 CLINICAL DATA:  Central line and ET tube placement EXAM: PORTABLE CHEST - 1 VIEW COMPARISON:  July 16, 2023 FINDINGS: Endotracheal tube terminates in  the mid trachea. Esophagogastric tube terminates in the region of the stomach. Overlying defibrillator pads. Markedly low lung volumes. No focal airspace consolidation, pleural effusion, or pneumothorax. No cardiomegaly. IMPRESSION: 1. Well-positioned endotracheal and esophagogastric tubes. No pneumothorax. 2. Markedly low lung volumes.  No pneumonia or pleural effusion. Electronically Signed   By: Rogelia Myers M.D.   On: 12/16/2023 16:26    ROS - unable to assess 2/2 condition of patient  PE Blood pressure 108/72, pulse (!) 109, temperature 98.7 F (37.1 C), resp. rate (!) 24, height 5' 5.98 (1.676 m), weight 103.8 kg, SpO2 93%. Constitutional: Intubated, sedated GI: Abd obese, soft, mild to moderate distention; no palpable hernias Psychiatric: unable to assess   Results for orders placed or performed during the hospital encounter of 12/16/23 (from the past 48 hours)  Comprehensive metabolic panel     Status: Abnormal   Collection Time: 12/16/23  3:24 PM  Result Value Ref Range   Sodium 129 (L) 135 - 145 mmol/L   Potassium 2.8 (L) 3.5 - 5.1 mmol/L   Chloride 96 (L) 98 - 111 mmol/L   CO2 16 (L) 22 - 32 mmol/L   Glucose, Bld 118 (H) 70 - 99 mg/dL    Comment: Glucose reference range applies only to samples taken after fasting for at least 8 hours.   BUN 55 (H) 6 - 20 mg/dL   Creatinine, Ser 6.07 (H) 0.61 - 1.24 mg/dL   Calcium  6.3 (LL) 8.9 - 10.3  mg/dL    Comment: CRITICAL RESULT CALLED TO, READ BACK BY AND VERIFIED WITH ALONSO CAHILL RN @1633  ON 12/16/23 MAB   Total Protein 5.2 (L) 6.5 - 8.1 g/dL   Albumin  1.6 (L) 3.5 - 5.0 g/dL   AST 68 (H) 15 - 41 U/L   ALT 37 0 - 44 U/L   Alkaline Phosphatase 69 38 - 126 U/L   Total Bilirubin 3.4 (H) 0.0 - 1.2 mg/dL   GFR, Estimated 17 (L) >60 mL/min    Comment: (NOTE) Calculated using the CKD-EPI Creatinine Equation (2021)    Anion gap 17 (H) 5 - 15    Comment: Performed at [facility], [address] [place], [place] [address]  CBC     Status: Abnormal   Collection Time: 12/16/23  3:24 PM  Result Value Ref Range   WBC 6.0 4.0 - 10.5 K/uL   RBC 4.65 4.22 - 5.81 MIL/uL   Hemoglobin 12.6 (L) 13.0 - 17.0 g/dL   HCT 62.8 (L) 60.9 - 47.9 %   MCV 79.8 (L) 80.0 - 100.0 fL   MCH 27.1 26.0 - 34.0 pg   MCHC 34.0 30.0 - 36.0 g/dL   RDW 86.2 88.4 - 84.4 %   Platelets 324 150 - 400 K/uL   nRBC 5.7 (H) 0.0 - 0.2 %    Comment: Performed at [facility], [address], [place], [place] [address]  APTT     Status: None   Collection Time: 12/16/23  3:24 PM  Result Value Ref Range   aPTT 29 24 - 36 seconds    Comment: Performed at [facility], [address] [place], [place] [address]  Protime-INR     Status: Abnormal   Collection Time: 12/16/23  3:24 PM  Result Value Ref Range   Prothrombin Time 16.3 (H) 11.4 - 15.2 seconds   INR 1.2 0.8 - 1.2    Comment: (NOTE) INR goal varies based on device and disease states. Performed  at [facility], [address] [place], [place] [address]   Magnesium      Status: None   Collection Time: 12/16/23  3:24 PM  Result Value Ref Range   Magnesium  2.3 1.7 - 2.4 mg/dL    Comment: Performed at [facility], [address] [place], [place] [address]  Phosphorus     Status: Abnormal   Collection Time: 12/16/23  3:24 PM  Result Value Ref Range   Phosphorus 7.3 (H) 2.5 - 4.6 mg/dL    Comment: Performed at [facility]  [facility], [address], [place], [place] [address]  Troponin I (High Sensitivity)     Status: Abnormal   Collection Time: 12/16/23  3:24 PM  Result Value Ref Range   Troponin I (High Sensitivity) 82 (H) <18 ng/L    Comment: (NOTE) Elevated high sensitivity troponin I (hsTnI) values and significant  changes across serial measurements may suggest ACS but many other  chronic and acute conditions are known to elevate hsTnI results.  Refer to the Links section for chest pain algorithms and additional  guidance. Performed at [facility], [address] [place], [place] [address]   Triglycerides     Status: None   Collection Time: 12/16/23  3:26 PM  Result Value Ref Range   Triglycerides 145 <150 mg/dL    Comment: Performed at [facility], [address] [place], [place] [address]  I-stat chem 8, ED (not at [facility], [facility] or [facility])     Status: Abnormal   Collection Time: 12/16/23  3:35 PM  Result Value Ref Range   Sodium 130 (L) 135 - 145 mmol/L   Potassium 2.8 (L) 3.5 - 5.1 mmol/L   Chloride 96 (L) 98 - 111 mmol/L   BUN 50 (H) 6 - 20 mg/dL   Creatinine, Ser 5.89 (H) 0.61 - 1.24 mg/dL   Glucose, Bld 885 (H) 70 - 99 mg/dL    Comment: Glucose reference range applies only to samples taken after fasting for at least 8 hours.   Calcium , Ion 0.79 (LL) 1.15 - 1.40 mmol/L   TCO2 17 (L) 22 - 32 mmol/L   Hemoglobin 12.9 (L) 13.0 - 17.0 g/dL   HCT 61.9 (L) 60.9 - 47.9 %   Comment NOTIFIED PHYSICIAN   I-Stat Lactic Acid, ED     Status: Abnormal   Collection Time: 12/16/23  3:38 PM  Result Value Ref Range   Lactic Acid, Venous 3.3 (HH) 0.5 - 1.9 mmol/L   Comment NOTIFIED PHYSICIAN   I-Stat CG4 Lactic Acid     Status: Abnormal   Collection Time: 12/16/23  4:00 PM  Result Value Ref Range   Lactic Acid, Venous 3.5 (HH) 0.5 - 1.9 mmol/L   Comment NOTIFIED PHYSICIAN   I-Stat arterial blood gas, ED     Status: Abnormal   Collection Time: 12/16/23  5:10 PM  Result Value Ref Range   pH,  Arterial 7.219 (L) 7.35 - 7.45   pCO2 arterial 54.3 (H) 32 - 48 mmHg   pO2, Arterial 74 (L) 83 - 108 mmHg   Bicarbonate 22.3 20.0 - 28.0 mmol/L   TCO2 24 22 - 32 mmol/L   O2 Saturation 92 %   Acid-base deficit 6.0 (H) 0.0 - 2.0 mmol/L   Sodium 123 (L) 135 - 145 mmol/L   Potassium 3.4 (L) 3.5 - 5.1 mmol/L   Calcium , Ion 0.97 (L) 1.15 - 1.40 mmol/L   HCT 37.0 (L) 39.0 -  52.0 %   Hemoglobin 12.6 (L) 13.0 - 17.0 g/dL   Patient temperature 02.0 F    Sample type ARTERIAL   CBG monitoring, ED     Status: Abnormal   Collection Time: 12/16/23  5:20 PM  Result Value Ref Range   Glucose-Capillary 137 (H) 70 - 99 mg/dL    Comment: Glucose reference range applies only to samples taken after fasting for at least 8 hours.  Glucose, capillary     Status: Abnormal   Collection Time: 12/16/23  6:24 PM  Result Value Ref Range   Glucose-Capillary 157 (H) 70 - 99 mg/dL    Comment: Glucose reference range applies only to samples taken after fasting for at least 8 hours.    CT Angio Chest Pulmonary Embolism (PE) W or WO Contrast Result Date: 12/16/2023 CLINICAL DATA:  Aortic aneurysm suspected. Severe right-sided abdominal pain and abdominal distention. Decreased oxygen saturation. EXAM: CT ANGIOGRAPHY CHEST CT ABDOMEN AND PELVIS WITH CONTRAST TECHNIQUE: Multidetector CT imaging of the chest was performed using the standard protocol during bolus administration of intravenous contrast. Multiplanar CT image reconstructions and MIPs were obtained to evaluate the vascular anatomy. Multidetector CT imaging of the abdomen and pelvis was performed using the standard protocol during bolus administration of intravenous contrast. RADIATION DOSE REDUCTION: This exam was performed according to the departmental dose-optimization program which includes automated exposure control, adjustment of the mA and/or kV according to patient size and/or use of iterative reconstruction technique. CONTRAST:  75mL OMNIPAQUE  IOHEXOL  350  MG/ML SOLN COMPARISON:  Chest radiograph 12/16/2023.  PET-CT 06/23/2023 FINDINGS: CTA CHEST FINDINGS Cardiovascular: Cardiac enlargement. No pericardial effusions. Normal caliber thoracic aorta. No aortic dissection. Calcification of the aorta and coronary arteries. Great vessel origins are patent. Postoperative changes consistent with coronary bypass. No evidence of significant pulmonary embolus. Mediastinum/Nodes: Enteric tube with tip in the stomach. Esophagus is decompressed. Endotracheal tube with tip above the carina. Thyroid gland is unremarkable. Scattered mediastinal lymph nodes are not pathologically enlarged, likely reactive. Lungs/Pleura: Consolidation or atelectasis in both lung bases, possibly pneumonia, compressive atelectasis, or aspiration. Small bilateral pleural effusions. No pneumothorax. Musculoskeletal: Degenerative changes in the spine. Sternotomy wires. No acute bony abnormalities. Review of the MIP images confirms the above findings. CT ABDOMEN and PELVIS FINDINGS Hepatobiliary: No focal liver lesions. Cholelithiasis with several stones in the gallbladder. No wall thickening or inflammatory stranding. No bile duct dilatation. Pancreas: Unremarkable. No pancreatic ductal dilatation or surrounding inflammatory changes. Spleen: Normal in size without focal abnormality. Adrenals/Urinary Tract: Left adrenal gland nodule measuring 2.1 cm diameter. Sixty-five Hounsfield unit density measurement. The lesions are unchanged since prior unenhanced study from 12/18/2020 at which time the density measurement was -3 Hounsfield units. This is consistent with a benign fat containing adenoma and no imaging follow-up is indicated. Kidneys are symmetrical and homogeneous. No hydronephrosis or hydroureter. Bladder is decompressed. Stomach/Bowel: Stomach is unremarkable. Colon is decompressed. Wall thickening of the ascending colon and hepatic flexure, new since prior study. This may indicate colitis or  inflammatory bowel disease versus reactive change. Dilated fluid-filled small bowel with air-fluid levels and mild diffuse wall thickening. Terminal ileum is decompressed. This is consistent with small-bowel obstruction, possibly due to inflammatory strictures. Vascular/Lymphatic: Aortic atherosclerosis. No enlarged abdominal or pelvic lymph nodes. Reproductive: Prostate is unremarkable. Other: Diffuse infiltration throughout the right-sided abdominal and pelvic mesenteric fat. Multiple loculated fluid collections are demonstrated throughout the right abdomen and pelvis. A right pelvic collection measures 7.4 cm diameter. A right anterior lower quadrant collection  measures 8.8 cm diameter. A right anterior para psoas collection measures 9.4 cm diameter. Multiple smaller right upper quadrant collections are demonstrated. Some collections contains small gas bubbles. This appearance is likely indicate multiple abscesses, possibly due to perforation. Could also consider a perforated appendicitis. Small amount of free fluid in the abdomen, likely ascites or reactive fluid. No free intra-abdominal gas. Musculoskeletal: Degenerative changes in the spine. No acute bony abnormalities. Review of the MIP images confirms the above findings. IMPRESSION: 1. Fluid-filled distended small bowel with decompressed terminal ileum consistent with small-bowel obstruction. 2. Irregular wall thickening diffusely throughout the distal small bowel and right colon suggesting enterocolitis or inflammatory bowel disease. Inflammatory strictures may be present. 3. Multiple loculated fluid collections, some with gas, demonstrated in the right abdomen and pelvis indicating abscesses. This is likely due to perforation, possibly related to inflammatory bowel disease or to an occult perforated appendicitis. The appendix is self is not demonstrated. No free intra-abdominal air. 4. Small amount of free fluid in the abdomen is likely reactive. 5.  Cholelithiasis without evidence of acute cholecystitis. 6. Aortic atherosclerosis. 7. Consolidation versus compressive atelectasis in both lung bases. Small bilateral pleural effusions. Critical Value/emergent results were called by telephone at the time of interpretation on 12/16/2023 at 5:28 pm to provider MATTHEW TRIFAN , who verbally acknowledged these results. Electronically Signed   By: Elsie Gravely M.D.   On: 12/16/2023 17:33   CT ABDOMEN PELVIS W CONTRAST Result Date: 12/16/2023 CLINICAL DATA:  Aortic aneurysm suspected. Severe right-sided abdominal pain and abdominal distention. Decreased oxygen saturation. EXAM: CT ANGIOGRAPHY CHEST CT ABDOMEN AND PELVIS WITH CONTRAST TECHNIQUE: Multidetector CT imaging of the chest was performed using the standard protocol during bolus administration of intravenous contrast. Multiplanar CT image reconstructions and MIPs were obtained to evaluate the vascular anatomy. Multidetector CT imaging of the abdomen and pelvis was performed using the standard protocol during bolus administration of intravenous contrast. RADIATION DOSE REDUCTION: This exam was performed according to the departmental dose-optimization program which includes automated exposure control, adjustment of the mA and/or kV according to patient size and/or use of iterative reconstruction technique. CONTRAST:  75mL OMNIPAQUE  IOHEXOL  350 MG/ML SOLN COMPARISON:  Chest radiograph 12/16/2023.  PET-CT 06/23/2023 FINDINGS: CTA CHEST FINDINGS Cardiovascular: Cardiac enlargement. No pericardial effusions. Normal caliber thoracic aorta. No aortic dissection. Calcification of the aorta and coronary arteries. Great vessel origins are patent. Postoperative changes consistent with coronary bypass. No evidence of significant pulmonary embolus. Mediastinum/Nodes: Enteric tube with tip in the stomach. Esophagus is decompressed. Endotracheal tube with tip above the carina. Thyroid gland is unremarkable. Scattered  mediastinal lymph nodes are not pathologically enlarged, likely reactive. Lungs/Pleura: Consolidation or atelectasis in both lung bases, possibly pneumonia, compressive atelectasis, or aspiration. Small bilateral pleural effusions. No pneumothorax. Musculoskeletal: Degenerative changes in the spine. Sternotomy wires. No acute bony abnormalities. Review of the MIP images confirms the above findings. CT ABDOMEN and PELVIS FINDINGS Hepatobiliary: No focal liver lesions. Cholelithiasis with several stones in the gallbladder. No wall thickening or inflammatory stranding. No bile duct dilatation. Pancreas: Unremarkable. No pancreatic ductal dilatation or surrounding inflammatory changes. Spleen: Normal in size without focal abnormality. Adrenals/Urinary Tract: Left adrenal gland nodule measuring 2.1 cm diameter. Sixty-five Hounsfield unit density measurement. The lesions are unchanged since prior unenhanced study from 12/18/2020 at which time the density measurement was -3 Hounsfield units. This is consistent with a benign fat containing adenoma and no imaging follow-up is indicated. Kidneys are symmetrical and homogeneous. No hydronephrosis or hydroureter. Bladder is  decompressed. Stomach/Bowel: Stomach is unremarkable. Colon is decompressed. Wall thickening of the ascending colon and hepatic flexure, new since prior study. This may indicate colitis or inflammatory bowel disease versus reactive change. Dilated fluid-filled small bowel with air-fluid levels and mild diffuse wall thickening. Terminal ileum is decompressed. This is consistent with small-bowel obstruction, possibly due to inflammatory strictures. Vascular/Lymphatic: Aortic atherosclerosis. No enlarged abdominal or pelvic lymph nodes. Reproductive: Prostate is unremarkable. Other: Diffuse infiltration throughout the right-sided abdominal and pelvic mesenteric fat. Multiple loculated fluid collections are demonstrated throughout the right abdomen and pelvis. A  right pelvic collection measures 7.4 cm diameter. A right anterior lower quadrant collection measures 8.8 cm diameter. A right anterior para psoas collection measures 9.4 cm diameter. Multiple smaller right upper quadrant collections are demonstrated. Some collections contains small gas bubbles. This appearance is likely indicate multiple abscesses, possibly due to perforation. Could also consider a perforated appendicitis. Small amount of free fluid in the abdomen, likely ascites or reactive fluid. No free intra-abdominal gas. Musculoskeletal: Degenerative changes in the spine. No acute bony abnormalities. Review of the MIP images confirms the above findings. IMPRESSION: 1. Fluid-filled distended small bowel with decompressed terminal ileum consistent with small-bowel obstruction. 2. Irregular wall thickening diffusely throughout the distal small bowel and right colon suggesting enterocolitis or inflammatory bowel disease. Inflammatory strictures may be present. 3. Multiple loculated fluid collections, some with gas, demonstrated in the right abdomen and pelvis indicating abscesses. This is likely due to perforation, possibly related to inflammatory bowel disease or to an occult perforated appendicitis. The appendix is self is not demonstrated. No free intra-abdominal air. 4. Small amount of free fluid in the abdomen is likely reactive. 5. Cholelithiasis without evidence of acute cholecystitis. 6. Aortic atherosclerosis. 7. Consolidation versus compressive atelectasis in both lung bases. Small bilateral pleural effusions. Critical Value/emergent results were called by telephone at the time of interpretation on 12/16/2023 at 5:28 pm to provider MATTHEW TRIFAN , who verbally acknowledged these results. Electronically Signed   By: Elsie Gravely M.D.   On: 12/16/2023 17:33   CT HEAD WO CONTRAST Result Date: 12/16/2023 CLINICAL DATA:  Altered mental status. EXAM: CT HEAD WITHOUT CONTRAST TECHNIQUE: Contiguous axial  images were obtained from the base of the skull through the vertex without intravenous contrast. RADIATION DOSE REDUCTION: This exam was performed according to the departmental dose-optimization program which includes automated exposure control, adjustment of the mA and/or kV according to patient size and/or use of iterative reconstruction technique. COMPARISON:  May 10, 2022 FINDINGS: Brain: No evidence of acute infarction, hemorrhage, hydrocephalus, extra-axial collection or mass lesion/mass effect. There are areas of mildly decreased attenuation within the Buffy Ehler matter tracts of the supratentorial brain, consistent with microvascular disease changes. Vascular: No hyperdense vessel or unexpected calcification. Skull: Normal. Negative for fracture or focal lesion. Sinuses/Orbits: No acute finding. Other: None. IMPRESSION: No acute intracranial pathology. Electronically Signed   By: Suzen Dials M.D.   On: 12/16/2023 17:17   DG Chest Port 1 View Result Date: 12/16/2023 CLINICAL DATA:  [id number] Encounter for central line placement [id number] EXAM: PORTABLE CHEST - 1 VIEW COMPARISON:  12/16/2023, 07/16/2023 FINDINGS: Similarly positioned endotracheal and esophagogastric tubes. Interval placement of a right IJ approach central venous catheter, which terminates at the cavoatrial junction. Improved aeration of the lungs. Retrocardiac airspace opacities. No pleural effusion or pneumothorax. No cardiomegaly. Sternotomy wires and CABG markers. Defibrillator pads. IMPRESSION: 1. Interval placement of a right IJ approach central venous catheter, which terminates at the cavoatrial junction. No  pneumothorax. 2. Improved aeration of the lungs with retrocardiac airspace opacities, possibly atelectasis or aspiration. Electronically Signed   By: Rogelia Myers M.D.   On: 12/16/2023 16:28   DG Chest Port 1 View Result Date: 12/16/2023 CLINICAL DATA:  Central line and ET tube placement EXAM: PORTABLE CHEST - 1 VIEW  COMPARISON:  July 16, 2023 FINDINGS: Endotracheal tube terminates in the mid trachea. Esophagogastric tube terminates in the region of the stomach. Overlying defibrillator pads. Markedly low lung volumes. No focal airspace consolidation, pleural effusion, or pneumothorax. No cardiomegaly. IMPRESSION: 1. Well-positioned endotracheal and esophagogastric tubes. No pneumothorax. 2. Markedly low lung volumes.  No pneumonia or pleural effusion. Electronically Signed   By: Rogelia Myers M.D.   On: 12/16/2023 16:26     A/P: DEONDRE MARINARO is an 58 y.o. male with CAD (s/p CABG), afib, HTN, HLD, arrhythmias here following radical prostatectomy and lymph node dissection 11/17/2023 for prostate cancer  - Does have reactive ileus in the setting of intra-abdominal fluid collections.  There is no free air.  Looking at these fluid collections they do track all the way down into the deep pelvis along the psoas muscle which would be a rather atypical location for any sort of fluid collections originating from a bowel source although not impossible.  Have discussed the case extensively with Dr. Watt as well.  Agree with their plans as noted as well.  - Continue OG tube to low intermittent wall suction for decompression in the setting of what is suspected to be in a reactive ileus - Broad-spectrum IV antibiotics as per critical care medicine - Consider IR for attempts at percutaneous drainage and culture - We will continue to follow closely with you.  Please let us  know if you have any additional question/concerns in the interim  I spent a total of 80 minutes in both face-to-face and non-face-to-face activities, excluding procedures performed, for this visit on the date of this encounter.  Lonni Pizza, MD [facility], [facility]

## 2023-12-16 NOTE — ED Notes (Signed)
 Pt cardioverted at 150Jules

## 2023-12-16 NOTE — Consult Note (Signed)
 Urology Consult   Physician requesting consult: Dr. Tamela   Reason for consult: abdominal abscesses s/p RALP   History of Present Illness: Glenn Stewart is a 58 y.o. with hx of CAD and CABG x5, Afib (not on AC), BL carotid stenosis, HTN, HLD who with altered mental status and arrhthymias. On arrival to ED, he developed VT and was hemodynamically unstable and underwent cardioversion. Ultimately required intubation while in the Trauma bay.   Initial labs demonstrate multiple abnormalities including elevated Cr to 4.1 (baseline ~1), MA, and hypokalemia. Hemoglobin appears stable ~12-13. Lactate of 3.3>3.5. CTAP ultimately obtained that demonstrates multiple large abdominal and pelvic abscesses. Bladder appears decompressed without Foley catheter. Foley catheter has since been placed with minimal to no output.   Urologically, has history of RALP on 11/17/23 [name]). Operative course was overall uncomplicated.   Past Medical History:  Diagnosis Date   ADHD (attention deficit hyperactivity disorder)    Anxiety state, unspecified    Bipolar 1 disorder (HCC)    Cancer (HCC)    CHEST PAIN 12/04/2008   Qualifier: Diagnosis of  By: [name] MD, [name]    Coronary artery disease    Depressive disorder, not elsewhere classified    Dermatophytosis of nail    Esophageal reflux    History of kidney stones    HYPERGLYCEMIA 07/10/2007   Qualifier: Diagnosis of  By: [name] MD, [name]    Low back pain 09/18/2013   Other chronic nonalcoholic liver disease    Pre-diabetes    Problems with hearing    Prostatitis, unspecified    Pure hypercholesterolemia    Screen for STD (sexually transmitted disease) 04/20/2011   Sprain of cruciate ligament of knee    Tear of medial cartilage or meniscus of knee, current    TMJ (temporomandibular joint disorder) 12/25/2010   Unspecified essential hypertension    Unspecified gastritis and gastroduodenitis without mention of hemorrhage    Urethritis,  unspecified     Past Surgical History:  Procedure Laterality Date   CORONARY ARTERY BYPASS GRAFT N/A 05/12/2022   Procedure: CORONARY ARTERY BYPASS GRAFTING (CABG) X FIVE BYPASSES USING OPEN LEFT INTERNAL MAMMARY ARTERY, OPEN LEFT RADIAL ARTERY, AND ENDOSCOPIC RIGHT GREATER SAPHENOUS VEIN HARVEST.;  Surgeon: [name], MD;  Location: [facility] OR;  Service: Open Heart Surgery;  Laterality: N/A;   CYSTOSCOPY W/ LITHOLAPAXY / EHL     LEFT HEART CATH AND CORONARY ANGIOGRAPHY N/A 05/10/2022   Procedure: LEFT HEART CATH AND CORONARY ANGIOGRAPHY;  Surgeon: [name], MD;  Location: [facility] INVASIVE CV LAB;  Service: Cardiovascular;  Laterality: N/A;   No prior surgery     RADIAL ARTERY HARVEST Left 05/12/2022   Procedure: RADIAL ARTERY HARVEST;  Surgeon: [name], MD;  Location: [facility] OR;  Service: Open Heart Surgery;  Laterality: Left;   ROBOT ASSISTED LAPAROSCOPIC RADICAL PROSTATECTOMY N/A 11/17/2023   Procedure: PROSTATECTOMY, RADICAL, ROBOT-ASSISTED, LAPAROSCOPIC;  Surgeon: [name], MD;  Location: [facility] ORS;  Service: Urology;  Laterality: N/A;  ROBOTIC RADICAL PROSTATECTOMY AND BILATERAL PELVIC LYMPH NODE DISSECTION   TEE WITHOUT CARDIOVERSION N/A 05/12/2022   Procedure: TRANSESOPHAGEAL ECHOCARDIOGRAM;  Surgeon: [name], MD;  Location: [facility] OR;  Service: Open Heart Surgery;  Laterality: N/A;    Current Hospital Medications:  Home Meds:  No current facility-administered medications on file prior to encounter.   Current Outpatient Medications on File Prior to Encounter  Medication Sig Dispense Refill   acetaminophen  (TYLENOL ) 325 MG tablet Take 2 tablets (  650 mg total) by mouth every 4 (four) hours as needed for moderate pain or headache.     ALPRAZolam  (XANAX ) 1 MG tablet Take 1 mg by mouth Once daily as needed for anxiety.     amLODipine  (NORVASC ) 5 MG tablet Take 1 tablet (5 mg total) by mouth daily. 90 tablet 3   atorvastatin  (LIPITOR) 80 MG  tablet Take 1 tablet (80 mg total) by mouth daily. 90 tablet 3   ciprofloxacin  (CIPRO ) 500 MG tablet Take 1 tablet (500 mg total) by mouth 2 (two) times daily. Start day prior to follow up appt for catheter removal 6 tablet 0   docusate sodium  (COLACE) 100 MG capsule Take 1 capsule (100 mg total) by mouth 2 (two) times daily.     isosorbide  mononitrate (IMDUR ) 30 MG 24 hr tablet Take 1 tablet (30 mg total) by mouth daily. 90 tablet 3   metoprolol  tartrate (LOPRESSOR ) 25 MG tablet TAKE ONE TABLET BY MOUTH TWICE DAILY 180 tablet 0   pantoprazole  (PROTONIX ) 20 MG tablet Take 1 tablet (20 mg total) by mouth daily. 30 tablet 0   sertraline  (ZOLOFT ) 100 MG tablet Take 100 mg by mouth daily.     traMADol  (ULTRAM ) 50 MG tablet Take 1-2 tablets (50-100 mg total) by mouth every 6 (six) hours as needed for moderate pain (pain score 4-6) or severe pain (pain score 7-10). 20 tablet 0     Scheduled Meds:  amiodarone   150 mg Intravenous Once   Chlorhexidine  Gluconate Cloth  6 each Topical Daily   fentaNYL  (SUBLIMAZE ) injection  25-50 mcg Intravenous Once   Continuous Infusions:  amiodarone  60 mg/hr (12/16/23 1600)   Followed by   amiodarone      calcium  gluconate     fentaNYL  infusion INTRAVENOUS 100 mcg/hr (12/16/23 1606)   lactated ringers      norepinephrine  (LEVOPHED ) Adult infusion 5 mcg/min (12/16/23 1603)   potassium chloride      PRN Meds:.fentaNYL   Allergies:  Allergies  Allergen Reactions   Sulfonamide Derivatives Hives    Family History  Problem Relation Age of Onset   Diabetes Father    Hypertension Father    Heart failure Father     Social History:  reports that he has never smoked. He has never used smokeless tobacco. He reports current alcohol use. He reports that he does not use drugs.  ROS: A complete review of systems was performed.  All systems are negative except for pertinent findings as noted.  Physical Exam:  Vital signs in last 24 hours: Temp:  [96.3 F (35.7  C)-98.7 F (37.1 C)] 98.7 F (37.1 C) (09/26 1730) Pulse Rate:  [54-154] 109 (09/26 1730) Resp:  [19-46] 24 (09/26 1730) BP: (102-128)/(42-91) 108/72 (09/26 1730) SpO2:  [90 %-100 %] 93 % (09/26 1730) FiO2 (%):  [100 %] 100 % (09/26 1535) Constitutional:  Intubated and sedated  Cardiovascular: tachycardic  Respiratory: Intubated  GI: Abdomen is quite distended and rigid. Incisions are well healing.  GU: Foley catheter in place with no output  Laboratory Data:  Recent Labs    12/16/23 1524 12/16/23 1535 12/16/23 1710  WBC 6.0  --   --   HGB 12.6* 12.9* 12.6*  HCT 37.1* 38.0* 37.0*  PLT 324  --   --     Recent Labs    12/16/23 1524 12/16/23 1535 12/16/23 1710  NA 129* 130* 123*  K 2.8* 2.8* 3.4*  CL 96* 96*  --   GLUCOSE 118* 114*  --  BUN 55* 50*  --   CALCIUM  6.3*  --   --   CREATININE 3.92* 4.10*  --      Results for orders placed or performed during the hospital encounter of 12/16/23 (from the past 24 hours)  Comprehensive metabolic panel     Status: Abnormal   Collection Time: 12/16/23  3:24 PM  Result Value Ref Range   Sodium 129 (L) 135 - 145 mmol/L   Potassium 2.8 (L) 3.5 - 5.1 mmol/L   Chloride 96 (L) 98 - 111 mmol/L   CO2 16 (L) 22 - 32 mmol/L   Glucose, Bld 118 (H) 70 - 99 mg/dL   BUN 55 (H) 6 - 20 mg/dL   Creatinine, Ser 6.07 (H) 0.61 - 1.24 mg/dL   Calcium  6.3 (LL) 8.9 - 10.3 mg/dL   Total Protein 5.2 (L) 6.5 - 8.1 g/dL   Albumin  1.6 (L) 3.5 - 5.0 g/dL   AST 68 (H) 15 - 41 U/L   ALT 37 0 - 44 U/L   Alkaline Phosphatase 69 38 - 126 U/L   Total Bilirubin 3.4 (H) 0.0 - 1.2 mg/dL   GFR, Estimated 17 (L) >60 mL/min   Anion gap 17 (H) 5 - 15  CBC     Status: Abnormal   Collection Time: 12/16/23  3:24 PM  Result Value Ref Range   WBC 6.0 4.0 - 10.5 K/uL   RBC 4.65 4.22 - 5.81 MIL/uL   Hemoglobin 12.6 (L) 13.0 - 17.0 g/dL   HCT 62.8 (L) 60.9 - 47.9 %   MCV 79.8 (L) 80.0 - 100.0 fL   MCH 27.1 26.0 - 34.0 pg   MCHC 34.0 30.0 - 36.0 g/dL   RDW  86.2 88.4 - 84.4 %   Platelets 324 150 - 400 K/uL   nRBC 5.7 (H) 0.0 - 0.2 %  APTT     Status: None   Collection Time: 12/16/23  3:24 PM  Result Value Ref Range   aPTT 29 24 - 36 seconds  Protime-INR     Status: Abnormal   Collection Time: 12/16/23  3:24 PM  Result Value Ref Range   Prothrombin Time 16.3 (H) 11.4 - 15.2 seconds   INR 1.2 0.8 - 1.2  Magnesium      Status: None   Collection Time: 12/16/23  3:24 PM  Result Value Ref Range   Magnesium  2.3 1.7 - 2.4 mg/dL  Phosphorus     Status: Abnormal   Collection Time: 12/16/23  3:24 PM  Result Value Ref Range   Phosphorus 7.3 (H) 2.5 - 4.6 mg/dL  Troponin I (High Sensitivity)     Status: Abnormal   Collection Time: 12/16/23  3:24 PM  Result Value Ref Range   Troponin I (High Sensitivity) 82 (H) <18 ng/L  Triglycerides     Status: None   Collection Time: 12/16/23  3:26 PM  Result Value Ref Range   Triglycerides 145 <150 mg/dL  I-stat chem 8, ED (not at [facility], [facility] or [facility])     Status: Abnormal   Collection Time: 12/16/23  3:35 PM  Result Value Ref Range   Sodium 130 (L) 135 - 145 mmol/L   Potassium 2.8 (L) 3.5 - 5.1 mmol/L   Chloride 96 (L) 98 - 111 mmol/L   BUN 50 (H) 6 - 20 mg/dL   Creatinine, Ser 5.89 (H) 0.61 - 1.24 mg/dL   Glucose, Bld 885 (H) 70 - 99 mg/dL   Calcium , Ion 0.79 (LL) 1.15 - 1.40  mmol/L   TCO2 17 (L) 22 - 32 mmol/L   Hemoglobin 12.9 (L) 13.0 - 17.0 g/dL   HCT 61.9 (L) 60.9 - 47.9 %   Comment NOTIFIED PHYSICIAN   I-Stat Lactic Acid, ED     Status: Abnormal   Collection Time: 12/16/23  3:38 PM  Result Value Ref Range   Lactic Acid, Venous 3.3 (HH) 0.5 - 1.9 mmol/L   Comment NOTIFIED PHYSICIAN   I-Stat CG4 Lactic Acid     Status: Abnormal   Collection Time: 12/16/23  4:00 PM  Result Value Ref Range   Lactic Acid, Venous 3.5 (HH) 0.5 - 1.9 mmol/L   Comment NOTIFIED PHYSICIAN   I-Stat arterial blood gas, ED     Status: Abnormal   Collection Time: 12/16/23  5:10 PM  Result Value Ref Range   pH, Arterial  7.219 (L) 7.35 - 7.45   pCO2 arterial 54.3 (H) 32 - 48 mmHg   pO2, Arterial 74 (L) 83 - 108 mmHg   Bicarbonate 22.3 20.0 - 28.0 mmol/L   TCO2 24 22 - 32 mmol/L   O2 Saturation 92 %   Acid-base deficit 6.0 (H) 0.0 - 2.0 mmol/L   Sodium 123 (L) 135 - 145 mmol/L   Potassium 3.4 (L) 3.5 - 5.1 mmol/L   Calcium , Ion 0.97 (L) 1.15 - 1.40 mmol/L   HCT 37.0 (L) 39.0 - 52.0 %   Hemoglobin 12.6 (L) 13.0 - 17.0 g/dL   Patient temperature 02.0 F    Sample type ARTERIAL   CBG monitoring, ED     Status: Abnormal   Collection Time: 12/16/23  5:20 PM  Result Value Ref Range   Glucose-Capillary 137 (H) 70 - 99 mg/dL   No results found for this or any previous visit (from the past 240 hours).  Renal Function: Recent Labs    12/16/23 1524 12/16/23 1535  CREATININE 3.92* 4.10*   CrCl cannot be calculated (Unknown ideal weight.).  Radiologic Imaging: CT Angio Chest Pulmonary Embolism (PE) W or WO Contrast Result Date: 12/16/2023 CLINICAL DATA:  Aortic aneurysm suspected. Severe right-sided abdominal pain and abdominal distention. Decreased oxygen saturation. EXAM: CT ANGIOGRAPHY CHEST CT ABDOMEN AND PELVIS WITH CONTRAST TECHNIQUE: Multidetector CT imaging of the chest was performed using the standard protocol during bolus administration of intravenous contrast. Multiplanar CT image reconstructions and MIPs were obtained to evaluate the vascular anatomy. Multidetector CT imaging of the abdomen and pelvis was performed using the standard protocol during bolus administration of intravenous contrast. RADIATION DOSE REDUCTION: This exam was performed according to the departmental dose-optimization program which includes automated exposure control, adjustment of the mA and/or kV according to patient size and/or use of iterative reconstruction technique. CONTRAST:  75mL OMNIPAQUE  IOHEXOL  350 MG/ML SOLN COMPARISON:  Chest radiograph 12/16/2023.  PET-CT 06/23/2023 FINDINGS: CTA CHEST FINDINGS Cardiovascular:  Cardiac enlargement. No pericardial effusions. Normal caliber thoracic aorta. No aortic dissection. Calcification of the aorta and coronary arteries. Great vessel origins are patent. Postoperative changes consistent with coronary bypass. No evidence of significant pulmonary embolus. Mediastinum/Nodes: Enteric tube with tip in the stomach. Esophagus is decompressed. Endotracheal tube with tip above the carina. Thyroid gland is unremarkable. Scattered mediastinal lymph nodes are not pathologically enlarged, likely reactive. Lungs/Pleura: Consolidation or atelectasis in both lung bases, possibly pneumonia, compressive atelectasis, or aspiration. Small bilateral pleural effusions. No pneumothorax. Musculoskeletal: Degenerative changes in the spine. Sternotomy wires. No acute bony abnormalities. Review of the MIP images confirms the above findings. CT ABDOMEN and PELVIS FINDINGS Hepatobiliary:  No focal liver lesions. Cholelithiasis with several stones in the gallbladder. No wall thickening or inflammatory stranding. No bile duct dilatation. Pancreas: Unremarkable. No pancreatic ductal dilatation or surrounding inflammatory changes. Spleen: Normal in size without focal abnormality. Adrenals/Urinary Tract: Left adrenal gland nodule measuring 2.1 cm diameter. Sixty-five Hounsfield unit density measurement. The lesions are unchanged since prior unenhanced study from 12/18/2020 at which time the density measurement was -3 Hounsfield units. This is consistent with a benign fat containing adenoma and no imaging follow-up is indicated. Kidneys are symmetrical and homogeneous. No hydronephrosis or hydroureter. Bladder is decompressed. Stomach/Bowel: Stomach is unremarkable. Colon is decompressed. Wall thickening of the ascending colon and hepatic flexure, new since prior study. This may indicate colitis or inflammatory bowel disease versus reactive change. Dilated fluid-filled small bowel with air-fluid levels and mild diffuse  wall thickening. Terminal ileum is decompressed. This is consistent with small-bowel obstruction, possibly due to inflammatory strictures. Vascular/Lymphatic: Aortic atherosclerosis. No enlarged abdominal or pelvic lymph nodes. Reproductive: Prostate is unremarkable. Other: Diffuse infiltration throughout the right-sided abdominal and pelvic mesenteric fat. Multiple loculated fluid collections are demonstrated throughout the right abdomen and pelvis. A right pelvic collection measures 7.4 cm diameter. A right anterior lower quadrant collection measures 8.8 cm diameter. A right anterior para psoas collection measures 9.4 cm diameter. Multiple smaller right upper quadrant collections are demonstrated. Some collections contains small gas bubbles. This appearance is likely indicate multiple abscesses, possibly due to perforation. Could also consider a perforated appendicitis. Small amount of free fluid in the abdomen, likely ascites or reactive fluid. No free intra-abdominal gas. Musculoskeletal: Degenerative changes in the spine. No acute bony abnormalities. Review of the MIP images confirms the above findings. IMPRESSION: 1. Fluid-filled distended small bowel with decompressed terminal ileum consistent with small-bowel obstruction. 2. Irregular wall thickening diffusely throughout the distal small bowel and right colon suggesting enterocolitis or inflammatory bowel disease. Inflammatory strictures may be present. 3. Multiple loculated fluid collections, some with gas, demonstrated in the right abdomen and pelvis indicating abscesses. This is likely due to perforation, possibly related to inflammatory bowel disease or to an occult perforated appendicitis. The appendix is self is not demonstrated. No free intra-abdominal air. 4. Small amount of free fluid in the abdomen is likely reactive. 5. Cholelithiasis without evidence of acute cholecystitis. 6. Aortic atherosclerosis. 7. Consolidation versus compressive  atelectasis in both lung bases. Small bilateral pleural effusions. Critical Value/emergent results were called by telephone at the time of interpretation on 12/16/2023 at 5:28 pm to provider MATTHEW TRIFAN , who verbally acknowledged these results. Electronically Signed   By: Elsie Gravely M.D.   On: 12/16/2023 17:33   CT ABDOMEN PELVIS W CONTRAST Result Date: 12/16/2023 CLINICAL DATA:  Aortic aneurysm suspected. Severe right-sided abdominal pain and abdominal distention. Decreased oxygen saturation. EXAM: CT ANGIOGRAPHY CHEST CT ABDOMEN AND PELVIS WITH CONTRAST TECHNIQUE: Multidetector CT imaging of the chest was performed using the standard protocol during bolus administration of intravenous contrast. Multiplanar CT image reconstructions and MIPs were obtained to evaluate the vascular anatomy. Multidetector CT imaging of the abdomen and pelvis was performed using the standard protocol during bolus administration of intravenous contrast. RADIATION DOSE REDUCTION: This exam was performed according to the departmental dose-optimization program which includes automated exposure control, adjustment of the mA and/or kV according to patient size and/or use of iterative reconstruction technique. CONTRAST:  75mL OMNIPAQUE  IOHEXOL  350 MG/ML SOLN COMPARISON:  Chest radiograph 12/16/2023.  PET-CT 06/23/2023 FINDINGS: CTA CHEST FINDINGS Cardiovascular: Cardiac enlargement. No pericardial effusions.  Normal caliber thoracic aorta. No aortic dissection. Calcification of the aorta and coronary arteries. Great vessel origins are patent. Postoperative changes consistent with coronary bypass. No evidence of significant pulmonary embolus. Mediastinum/Nodes: Enteric tube with tip in the stomach. Esophagus is decompressed. Endotracheal tube with tip above the carina. Thyroid gland is unremarkable. Scattered mediastinal lymph nodes are not pathologically enlarged, likely reactive. Lungs/Pleura: Consolidation or atelectasis in both  lung bases, possibly pneumonia, compressive atelectasis, or aspiration. Small bilateral pleural effusions. No pneumothorax. Musculoskeletal: Degenerative changes in the spine. Sternotomy wires. No acute bony abnormalities. Review of the MIP images confirms the above findings. CT ABDOMEN and PELVIS FINDINGS Hepatobiliary: No focal liver lesions. Cholelithiasis with several stones in the gallbladder. No wall thickening or inflammatory stranding. No bile duct dilatation. Pancreas: Unremarkable. No pancreatic ductal dilatation or surrounding inflammatory changes. Spleen: Normal in size without focal abnormality. Adrenals/Urinary Tract: Left adrenal gland nodule measuring 2.1 cm diameter. Sixty-five Hounsfield unit density measurement. The lesions are unchanged since prior unenhanced study from 12/18/2020 at which time the density measurement was -3 Hounsfield units. This is consistent with a benign fat containing adenoma and no imaging follow-up is indicated. Kidneys are symmetrical and homogeneous. No hydronephrosis or hydroureter. Bladder is decompressed. Stomach/Bowel: Stomach is unremarkable. Colon is decompressed. Wall thickening of the ascending colon and hepatic flexure, new since prior study. This may indicate colitis or inflammatory bowel disease versus reactive change. Dilated fluid-filled small bowel with air-fluid levels and mild diffuse wall thickening. Terminal ileum is decompressed. This is consistent with small-bowel obstruction, possibly due to inflammatory strictures. Vascular/Lymphatic: Aortic atherosclerosis. No enlarged abdominal or pelvic lymph nodes. Reproductive: Prostate is unremarkable. Other: Diffuse infiltration throughout the right-sided abdominal and pelvic mesenteric fat. Multiple loculated fluid collections are demonstrated throughout the right abdomen and pelvis. A right pelvic collection measures 7.4 cm diameter. A right anterior lower quadrant collection measures 8.8 cm diameter. A  right anterior para psoas collection measures 9.4 cm diameter. Multiple smaller right upper quadrant collections are demonstrated. Some collections contains small gas bubbles. This appearance is likely indicate multiple abscesses, possibly due to perforation. Could also consider a perforated appendicitis. Small amount of free fluid in the abdomen, likely ascites or reactive fluid. No free intra-abdominal gas. Musculoskeletal: Degenerative changes in the spine. No acute bony abnormalities. Review of the MIP images confirms the above findings. IMPRESSION: 1. Fluid-filled distended small bowel with decompressed terminal ileum consistent with small-bowel obstruction. 2. Irregular wall thickening diffusely throughout the distal small bowel and right colon suggesting enterocolitis or inflammatory bowel disease. Inflammatory strictures may be present. 3. Multiple loculated fluid collections, some with gas, demonstrated in the right abdomen and pelvis indicating abscesses. This is likely due to perforation, possibly related to inflammatory bowel disease or to an occult perforated appendicitis. The appendix is self is not demonstrated. No free intra-abdominal air. 4. Small amount of free fluid in the abdomen is likely reactive. 5. Cholelithiasis without evidence of acute cholecystitis. 6. Aortic atherosclerosis. 7. Consolidation versus compressive atelectasis in both lung bases. Small bilateral pleural effusions. Critical Value/emergent results were called by telephone at the time of interpretation on 12/16/2023 at 5:28 pm to provider MATTHEW TRIFAN , who verbally acknowledged these results. Electronically Signed   By: Elsie Gravely M.D.   On: 12/16/2023 17:33   CT HEAD WO CONTRAST Result Date: 12/16/2023 CLINICAL DATA:  Altered mental status. EXAM: CT HEAD WITHOUT CONTRAST TECHNIQUE: Contiguous axial images were obtained from the base of the skull through the vertex without intravenous contrast.  RADIATION DOSE  REDUCTION: This exam was performed according to the departmental dose-optimization program which includes automated exposure control, adjustment of the mA and/or kV according to patient size and/or use of iterative reconstruction technique. COMPARISON:  May 10, 2022 FINDINGS: Brain: No evidence of acute infarction, hemorrhage, hydrocephalus, extra-axial collection or mass lesion/mass effect. There are areas of mildly decreased attenuation within the white matter tracts of the supratentorial brain, consistent with microvascular disease changes. Vascular: No hyperdense vessel or unexpected calcification. Skull: Normal. Negative for fracture or focal lesion. Sinuses/Orbits: No acute finding. Other: None. IMPRESSION: No acute intracranial pathology. Electronically Signed   By: Suzen Dials M.D.   On: 12/16/2023 17:17   DG Chest Port 1 View Result Date: 12/16/2023 CLINICAL DATA:  [id number] Encounter for central line placement [id number] EXAM: PORTABLE CHEST - 1 VIEW COMPARISON:  12/16/2023, 07/16/2023 FINDINGS: Similarly positioned endotracheal and esophagogastric tubes. Interval placement of a right IJ approach central venous catheter, which terminates at the cavoatrial junction. Improved aeration of the lungs. Retrocardiac airspace opacities. No pleural effusion or pneumothorax. No cardiomegaly. Sternotomy wires and CABG markers. Defibrillator pads. IMPRESSION: 1. Interval placement of a right IJ approach central venous catheter, which terminates at the cavoatrial junction. No pneumothorax. 2. Improved aeration of the lungs with retrocardiac airspace opacities, possibly atelectasis or aspiration. Electronically Signed   By: Rogelia Myers M.D.   On: 12/16/2023 16:28   DG Chest Port 1 View Result Date: 12/16/2023 CLINICAL DATA:  Central line and ET tube placement EXAM: PORTABLE CHEST - 1 VIEW COMPARISON:  July 16, 2023 FINDINGS: Endotracheal tube terminates in the mid trachea. Esophagogastric tube terminates  in the region of the stomach. Overlying defibrillator pads. Markedly low lung volumes. No focal airspace consolidation, pleural effusion, or pneumothorax. No cardiomegaly. IMPRESSION: 1. Well-positioned endotracheal and esophagogastric tubes. No pneumothorax. 2. Markedly low lung volumes.  No pneumonia or pleural effusion. Electronically Signed   By: Rogelia Myers M.D.   On: 12/16/2023 16:26    I independently reviewed the above imaging studies.  Impression 38M with complex cardiac history who presented with arrhythmias and AMS ultimately requiring emergent intubation in the ED. CTAP with multiple intra-abdominal abscesses concerning for possible perforation, though urine leak cannot be definitively ruled out based on this scan alone.  Currently remains on pressors, intubated and sedated in the ICU.  Recommendations - Continue Foley catheter.  - Recommend CT Cystogram to evaluate for possible urine leak  - Consider drainage of intra-abdominal abscess with VIR  - Appreciate excellent care from MICU and recommendations from general surgery   Glenn Stewart 12/16/2023, 6:17 PM

## 2023-12-16 NOTE — H&P (Addendum)
 NAME:  Glenn Stewart, MRN:  [id number], DOB:  10-Jan-1966, LOS: 0 ADMISSION DATE:  12/16/2023, CONSULTATION DATE:  12/16/23 REFERRING MD:  Dr. Cottie, CHIEF COMPLAINT:  weakness   History of Present Illness:  HPI obtained from EMR as pt is intubated and sedated.   69 yoM with PMH of HTN, CAD, CABG x 5 04/2022 with post op Afib since stopped on Eliquis  and amiodarone  09/2022, HTN, HLD, GERD, NASH, bipolar/ ADHD and s/p recent robotic assisted laparoscopic radial prostatectomy on 11/17/23 who presented for weakness and waxing mental status with distended and tender abdominal by EMS found with episodes of Afib with RVR and VT.  Was hemodynamically unstable on arrival, underwent emergent cardioversion and given amiodarone  x2 and magnesium , with emerging rhythm of afib with RVR possible flutter.  Cardiology consulted. Post cardioversion became hypotensive with respiratory distress therefore intubated and placed on norepinephrine .  Difficulty oxygenating post intubation with normal PIP requiring 1.0 and 12 PEEP.  Labs noted for K2.8, Na 129, bicarb 16, BUN/ sCr 55/ 3.92 (recently  ), t. Bili 3.4, AST 68, albumin  1.6, protein 3.4, INR 1.2.  CTH neg, CTA PE and CT a/p w/contrast pending official read.  2.1L of brownish OGT contents s/p OGT placement. Cultures sent, empiric zosyn / vanc started.  Afebrile, anuric and since weaned off pressors.  R internal jugular CVL placed by EDP.  PCCM consulted for admission.     Pertinent  Medical History   Past Medical History:  Diagnosis Date   ADHD (attention deficit hyperactivity disorder)    Anxiety state, unspecified    Bipolar 1 disorder (HCC)    Cancer (HCC)    CHEST PAIN 12/04/2008   Qualifier: Diagnosis of  By: [name] MD, [name]    Coronary artery disease    Depressive disorder, not elsewhere classified    Dermatophytosis of nail    Esophageal reflux    History of kidney stones    HYPERGLYCEMIA 07/10/2007   Qualifier: Diagnosis of  By: [name] MD, [name]    Low back pain 09/18/2013   Other chronic nonalcoholic liver disease    Pre-diabetes    Problems with hearing    Prostatitis, unspecified    Pure hypercholesterolemia    Screen for STD (sexually transmitted disease) 04/20/2011   Sprain of cruciate ligament of knee    Tear of medial cartilage or meniscus of knee, current    TMJ (temporomandibular joint disorder) 12/25/2010   Unspecified essential hypertension    Unspecified gastritis and gastroduodenitis without mention of hemorrhage    Urethritis, unspecified    Significant Hospital Events: Including procedures, antibiotic start and stop dates in addition to other pertinent events   9/26> admit  Interim History / Subjective:   Objective    Blood pressure 115/76, pulse (!) 115, resp. rate (!) 24, height 5' 5.98 (1.676 m), SpO2 94%.    Vent Mode: PRVC FiO2 (%):  [100 %] 100 % Set Rate:  [20 bmp] 20 bmp Vt Set:  [450 mL] 450 mL PEEP:  [5 cmH20] 5 cmH20   Intake/Output Summary (Last 24 hours) at 12/16/2023 1700 Last data filed at 12/16/2023 1539 Gross per 24 hour  Intake 1000 ml  Output --  Net 1000 ml   There were no vitals filed for this visit.  Examination: Fent 100 General:  critically ill adult male sedated on MV HEENT: MM pink/moist, pupils 3/sluggish, anicteric, ETT/ OGT- brown ?feculent material> 2.1L out thus far, improving forehead diaphoresis per er Neuro: sedated  CV: rr, ST, no murmur, +1 pulses PULM:  MV supported, slightly coarse, no wheeze, PIP 30 GI: distended and taut, no BS, foley- no UOP Extremities: cool/dry, no tibial edema  Skin: no rashes    Resolved problem list   Assessment and Plan   Shock, likely septic, r/o cardiogenic component Prostate cancer s/p robotic assisted laparoscopic radial prostatectomy on 11/17/23 - CT abd/ pelvis > fluid-filled SBO with decompressed terminal ileum c/w SBO, irregular wall thickening diffusely in distal SB and right colon suggestive of enterocolitis vs  IBD, inflammatory strictures may be present, multiple loculated fluid collections, some with gas in R abd and pelvis indicating abscesses, concern for perforation, no free intra-abd air P:  - CCS and urology consulted, appreciate further recs > plans for stat CT cystogram  - currently off pressors, NE prn MAP > 65, additional LR now then MIVF, consider albumin  - trend lactic acid/ CVP - pan culture, follow - vanc/ zosyn  per pharmacy  - trend WBC/ fever curve.  WBC 6, afebrile.  add on differential  - NPO - echo, cardiac workup as below  Acute hypoxic and hypercarbic respiratory failure, suspected early aspiration of BLL PNA/ pneumonitis concerning for developing early ARDS/ALI - CTA PE neg for acute PE P:  - cont full LTVV support, 4-8cc/kg IBW with goal Pplat <30 and DP<15  - ABG noted, RR and TV adjusted.  PF ratio 74.  Repeat ABG in 1hr.  Needs aline - PIP currently ok, cont to monitor with abd distention - VAP prevention protocol/ PPI - PAD protocol for sedation> fentanyl , prn propofol  - CXR in am - wean PEEP/ FiO2 as able for SpO2 >92%  - trach asp - abx as above, check MRSA PCR   Afib with RVR  VT, monomorphic Elevated troponin- suspected demand  Hx CAD, prior CABG x 5 04/2022, and post op Afib (off amio/ eliquis  09/2022), HTN, HLD - s/p emergent cardioversion, amiodarone , mag - appreciate cardiology input - suspected in setting of multiple electrolyte abnormalities and critical illness - optimize electrolytes/ replete prn> K, calcium  gluconate now.  Trend on labs - cont amiodarone  gtt - defer heparin  gtt for now pending surgery eval - echo, trend troponin's  AKI  Hypokalemia Hypocalcemia  Hyponatremia, likely hypovolemic - no hydronephrosis on CT, bladder decompressed  P:  - s/p 1L, give additional 1L - need UA and urine studies when able, currently anuric  - foley - repeat BMET at 2100 - trend renal indices  - strict I/Os, daily wts - avoid nephrotoxins, renal  dose meds, hemodynamic support as above  Elevated t. Bili - suspected in setting of sepsis, trend on labs  Labs   CBC: Recent Labs  Lab 12/16/23 1524 12/16/23 1535  WBC 6.0  --   HGB 12.6* 12.9*  HCT 37.1* 38.0*  MCV 79.8*  --   PLT 324  --     Basic Metabolic Panel: Recent Labs  Lab 12/16/23 1524 12/16/23 1535  NA 129* 130*  K 2.8* 2.8*  CL 96* 96*  CO2 16*  --   GLUCOSE 118* 114*  BUN 55* 50*  CREATININE 3.92* 4.10*  CALCIUM  6.3*  --   MG 2.3  --   PHOS 7.3*  --    GFR: CrCl cannot be calculated (Unknown ideal weight.). Recent Labs  Lab 12/16/23 1524 12/16/23 1538 12/16/23 1600  WBC 6.0  --   --   LATICACIDVEN  --  3.3* 3.5*    Liver Function Tests: Recent Labs  Lab 12/16/23 1524  AST 68*  ALT 37  ALKPHOS 69  BILITOT 3.4*  PROT 5.2*  ALBUMIN  1.6*   No results for input(s): LIPASE, AMYLASE in the last 168 hours. No results for input(s): AMMONIA in the last 168 hours.  ABG    Component Value Date/Time   PHART 7.336 (L) 05/12/2022 2120   PCO2ART 41.4 05/12/2022 2120   PO2ART 105 05/12/2022 2120   HCO3 22.1 05/12/2022 2120   TCO2 17 (L) 12/16/2023 1535   ACIDBASEDEF 4.0 (H) 05/12/2022 2120   O2SAT 98 05/12/2022 2120     Coagulation Profile: Recent Labs  Lab 12/16/23 1524  INR 1.2    Cardiac Enzymes: No results for input(s): CKTOTAL, CKMB, CKMBINDEX, TROPONINI in the last 168 hours.  HbA1C: Hgb A1c MFr Bld  Date/Time Value Ref Range Status  05/10/2022 11:36 AM 5.4 4.8 - 5.6 % Final    Comment:    (NOTE) Pre diabetes:          5.7%-6.4%  Diabetes:              >6.4%  Glycemic control for   <7.0% adults with diabetes   04/03/2020 11:46 AM 5.9 4.6 - 6.5 % Final    Comment:    Glycemic Control Guidelines for People with Diabetes:Non Diabetic:  <6%Goal of Therapy: <7%Additional Action Suggested:  >8%     CBG: No results for input(s): GLUCAP in the last 168 hours.  Review of Systems:   Unable   Past  Medical History:  He,  has a past medical history of ADHD (attention deficit hyperactivity disorder), Anxiety state, unspecified, Bipolar 1 disorder (HCC), Cancer (HCC), CHEST PAIN (12/04/2008), Coronary artery disease, Depressive disorder, not elsewhere classified, Dermatophytosis of nail, Esophageal reflux, History of kidney stones, HYPERGLYCEMIA (07/10/2007), Low back pain (09/18/2013), Other chronic nonalcoholic liver disease, Pre-diabetes, Problems with hearing, Prostatitis, unspecified, Pure hypercholesterolemia, Screen for STD (sexually transmitted disease) (04/20/2011), Sprain of cruciate ligament of knee, Tear of medial cartilage or meniscus of knee, current, TMJ (temporomandibular joint disorder) (12/25/2010), Unspecified essential hypertension, Unspecified gastritis and gastroduodenitis without mention of hemorrhage, and Urethritis, unspecified.   Surgical History:   Past Surgical History:  Procedure Laterality Date   CORONARY ARTERY BYPASS GRAFT N/A 05/12/2022   Procedure: CORONARY ARTERY BYPASS GRAFTING (CABG) X FIVE BYPASSES USING OPEN LEFT INTERNAL MAMMARY ARTERY, OPEN LEFT RADIAL ARTERY, AND ENDOSCOPIC RIGHT GREATER SAPHENOUS VEIN HARVEST.;  Surgeon: [name], MD;  Location: [facility] OR;  Service: Open Heart Surgery;  Laterality: N/A;   CYSTOSCOPY W/ LITHOLAPAXY / EHL     LEFT HEART CATH AND CORONARY ANGIOGRAPHY N/A 05/10/2022   Procedure: LEFT HEART CATH AND CORONARY ANGIOGRAPHY;  Surgeon: [name], MD;  Location: [facility] INVASIVE CV LAB;  Service: Cardiovascular;  Laterality: N/A;   No prior surgery     RADIAL ARTERY HARVEST Left 05/12/2022   Procedure: RADIAL ARTERY HARVEST;  Surgeon: [name], MD;  Location: [facility];  Service: Open Heart Surgery;  Laterality: Left;   ROBOT ASSISTED LAPAROSCOPIC RADICAL PROSTATECTOMY N/A 11/17/2023   Procedure: PROSTATECTOMY, RADICAL, ROBOT-ASSISTED, LAPAROSCOPIC;  Surgeon: [name], MD;  Location: [facility] ORS;   Service: Urology;  Laterality: N/A;  ROBOTIC RADICAL PROSTATECTOMY AND BILATERAL PELVIC LYMPH NODE DISSECTION   TEE WITHOUT CARDIOVERSION N/A 05/12/2022   Procedure: TRANSESOPHAGEAL ECHOCARDIOGRAM;  Surgeon: [name], MD;  Location: [facility];  Service: Open Heart Surgery;  Laterality: N/A;     Social History:   reports that he has  never smoked. He has never used smokeless tobacco. He reports current alcohol use. He reports that he does not use drugs.   Family History:  His family history includes Diabetes in his father; Heart failure in his father; Hypertension in his father.   Allergies Allergies  Allergen Reactions   Sulfonamide Derivatives Hives     Home Medications  Prior to Admission medications   Medication Sig Start Date End Date Taking? Authorizing Provider  acetaminophen  (TYLENOL ) 325 MG tablet Take 2 tablets (650 mg total) by mouth every 4 (four) hours as needed for moderate pain or headache. 05/19/22   Zimmerman, Donielle M, PA-C  ALPRAZolam  (XANAX ) 1 MG tablet Take 1 mg by mouth Once daily as needed for anxiety. 04/17/11   [provider]  amLODipine  (NORVASC ) 5 MG tablet Take 1 tablet (5 mg total) by mouth daily. 12/07/23   Pietro Redell RAMAN, MD  atorvastatin  (LIPITOR) 80 MG tablet Take 1 tablet (80 mg total) by mouth daily. 12/07/23   Pietro Redell RAMAN, MD  ciprofloxacin  (CIPRO ) 500 MG tablet Take 1 tablet (500 mg total) by mouth 2 (two) times daily. Start day prior to follow up appt for catheter removal 11/17/23   Cory Palma, PA-C  docusate sodium  (COLACE) 100 MG capsule Take 1 capsule (100 mg total) by mouth 2 (two) times daily. 11/17/23   Cory Palma, PA-C  isosorbide  mononitrate (IMDUR ) 30 MG 24 hr tablet Take 1 tablet (30 mg total) by mouth daily. 12/07/23   Pietro Redell RAMAN, MD  metoprolol  tartrate (LOPRESSOR ) 25 MG tablet TAKE ONE TABLET BY MOUTH TWICE DAILY 11/23/23   Goodrich, Callie E, PA-C  pantoprazole  (PROTONIX ) 20 MG tablet Take 1 tablet (20 mg  total) by mouth daily. 12/07/23 01/06/24  Pietro Redell RAMAN, MD  sertraline  (ZOLOFT ) 100 MG tablet Take 100 mg by mouth daily.    [provider]  traMADol  (ULTRAM ) 50 MG tablet Take 1-2 tablets (50-100 mg total) by mouth every 6 (six) hours as needed for moderate pain (pain score 4-6) or severe pain (pain score 7-10). 11/17/23   Cory Palma, PA-C     Critical care time:     CRITICAL CARE Performed by: Lyle Pesa   Total critical care time: 60 minutes  Critical care time was exclusive of separately billable procedures and treating other patients.  Critical care was necessary to treat or prevent imminent or life-threatening deterioration.  Critical care was time spent personally by me on the following activities: development of treatment plan with patient and/or surrogate as well as nursing, discussions with consultants, evaluation of patient's response to treatment, examination of patient, obtaining history from patient or surrogate, ordering and performing treatments and interventions, ordering and review of laboratory studies, ordering and review of radiographic studies, pulse oximetry and re-evaluation of patient's condition.    Lyle Pesa, NP [facility] 12/16/2023, 6:21 PM  See Amion for pager If no response to pager , please call [phone] until 7pm After 7:00 pm call Elink  [phone]

## 2023-12-16 NOTE — Progress Notes (Signed)
 NOK sister- Glenn Stewart wrong number.  Able to contact other sister Glenn Stewart contacted, updated contacts of Glenn Stewart updated.  Glenn Stewart states pt lives alone, not married, no children but has not recently spoken with him, she is currently at [employer].  Given contact for mother who lives in [place].    Called and spoke with mother, Glenn Stewart [phone].  States she spoke with him yesterday and he was not feeling well yesterday, having SOB and hard time going up stairs but otherwise could not recall him c/o of other symptoms.  Was unable to reach him by phone today.  Prior to yesterday, they spoke 1 week ago, could not recall if he was feeling bad then. Updated on critical illness and pending further workup.   Mother is not able to visit, will be available by phone   Glenn Pesa, NP [facility] 12/16/2023, 7:13 PM  See Amion for pager If no response to pager , please call [phone] until 7pm After 7:00 pm call Elink  [phone]

## 2023-12-16 NOTE — Progress Notes (Addendum)
 Cardiology Consultation   Patient ID: Glenn Stewart MRN: [id number]; DOB: 1965-05-15  Admit date: 12/16/2023 Date of Consult: 12/16/2023  PCP:  Freddrick Johns   [facility] Providers Cardiologist:  Redell Shallow, MD        Patient Profile: Glenn Stewart is a 58 y.o. male with history of CAD and CABG, paroxysmal atrial fibrillation who is being seen 12/16/2023 for the evaluation of ventricular tachycardia and atrial fibrillation at the request of Dr. Cottie.  History of Present Illness: Glenn Stewart was in the urology office today for a 1 month follow-up after prostate surgery (robotic assisted laparoscopic radical prostatectomy on 11/17/2023).  He had been feeling poorly.  While there he developed waxing waning consciousness and EMS was called.  According to EMS he has had frequently changing cardiac rhythm with episodes of regular SVT, atrial fibrillation, wide-complex tachycardia consistent with VT with spontaneous termination of sinus rhythm and then recurrence.  EMS also noted that he had a distended and extremely tender abdomen, barely able to touch.  She did not report chest pain as far as I can tell.  When he arrived to the emergency room he developed sustained VT and was/hemodynamically unstable and underwent cardioversion, after having received intravenous magnesium  and amiodarone  bolus/drip.  Emerging rhythm was atrial fibrillation rapid ventricular sponsor (possible atrial flutter), but he became very tachypneic with impending respiratory insufficiency and was therefore intubated.  Following intubation he became transiently hypotensive and intravenous norepinephrine  was started.  Another bolus of amiodarone  was administered and his rhythm appeared to stabilize to sinus tachycardia.  Initial labs show multiple metabolic abnormalities including renal insufficiency (creatinine 4.1), metabolic acidosis and major electrolyte abnormalities (potassium 2.8).  Currently getting a CT  of the abdomen.  Plain chest x-ray does not show evidence of congestive heart failure or free air under the diaphragm.  He has a history of relatively recent bypass surgery (CABG x 5: LIMA-LAD, radial-OM1, SVG-D1, sequential SVG-OM-PLV, February 2024) without any coronary or ischemic events since then.  He has preserved LVEF at 50-55% and has not had overt problems with congestive heart failure.  He had postoperative paroxysmal atrial fibrillation, but since this did not recur for several months he is amiodarone  and Eliquis  were stopped in July 2024.  Additional medical problems include well-controlled hypertension, treated hyperlipidemia (switched from simvastatin  to atorvastatin  last July), bipolar disorder and ADHD, mild carotid stenosis, GERD, NASH.   Past Medical History:  Diagnosis Date   ADHD (attention deficit hyperactivity disorder)    Anxiety state, unspecified    Bipolar 1 disorder (HCC)    Cancer (HCC)    CHEST PAIN 12/04/2008   Qualifier: Diagnosis of  By: [name] MD, [name]    Coronary artery disease    Depressive disorder, not elsewhere classified    Dermatophytosis of nail    Esophageal reflux    History of kidney stones    HYPERGLYCEMIA 07/10/2007   Qualifier: Diagnosis of  By: [name] MD, [name]    Low back pain 09/18/2013   Other chronic nonalcoholic liver disease    Pre-diabetes    Problems with hearing    Prostatitis, unspecified    Pure hypercholesterolemia    Screen for STD (sexually transmitted disease) 04/20/2011   Sprain of cruciate ligament of knee    Tear of medial cartilage or meniscus of knee, current    TMJ (temporomandibular joint disorder) 12/25/2010   Unspecified essential hypertension    Unspecified gastritis and gastroduodenitis without mention of hemorrhage  Urethritis, unspecified     Past Surgical History:  Procedure Laterality Date   CORONARY ARTERY BYPASS GRAFT N/A 05/12/2022   Procedure: CORONARY ARTERY BYPASS GRAFTING (CABG) X  FIVE BYPASSES USING OPEN LEFT INTERNAL MAMMARY ARTERY, OPEN LEFT RADIAL ARTERY, AND ENDOSCOPIC RIGHT GREATER SAPHENOUS VEIN HARVEST.;  Surgeon: [name], MD;  Location: [facility] OR;  Service: Open Heart Surgery;  Laterality: N/A;   CYSTOSCOPY W/ LITHOLAPAXY / EHL     LEFT HEART CATH AND CORONARY ANGIOGRAPHY N/A 05/10/2022   Procedure: LEFT HEART CATH AND CORONARY ANGIOGRAPHY;  Surgeon: [name], MD;  Location: [facility] INVASIVE CV LAB;  Service: Cardiovascular;  Laterality: N/A;   No prior surgery     RADIAL ARTERY HARVEST Left 05/12/2022   Procedure: RADIAL ARTERY HARVEST;  Surgeon: [name], MD;  Location: [facility] OR;  Service: Open Heart Surgery;  Laterality: Left;   ROBOT ASSISTED LAPAROSCOPIC RADICAL PROSTATECTOMY N/A 11/17/2023   Procedure: PROSTATECTOMY, RADICAL, ROBOT-ASSISTED, LAPAROSCOPIC;  Surgeon: [name], MD;  Location: [facility] ORS;  Service: Urology;  Laterality: N/A;  ROBOTIC RADICAL PROSTATECTOMY AND BILATERAL PELVIC LYMPH NODE DISSECTION   TEE WITHOUT CARDIOVERSION N/A 05/12/2022   Procedure: TRANSESOPHAGEAL ECHOCARDIOGRAM;  Surgeon: [name], MD;  Location: [facility] OR;  Service: Open Heart Surgery;  Laterality: N/A;       Scheduled Meds:  amiodarone   150 mg Intravenous Once   fentaNYL  (SUBLIMAZE ) injection  25-50 mcg Intravenous Once   Continuous Infusions:  sodium chloride  Stopped (12/16/23 1558)   amiodarone  60 mg/hr (12/16/23 1600)   Followed by   amiodarone      amiodarone  Stopped (12/16/23 1558)   fentaNYL  infusion INTRAVENOUS 100 mcg/hr (12/16/23 1606)   norepinephrine  (LEVOPHED ) Adult infusion 5 mcg/min (12/16/23 1603)   potassium chloride      vancomycin  1,750 mg (12/16/23 1615)   PRN Meds: sodium chloride , amiodarone , etomidate , fentaNYL , fentaNYL , phenylephrine , phenylephrine , rocuronium   Allergies:    Allergies  Allergen Reactions   Sulfonamide Derivatives Hives    Social History:   Social History    Socioeconomic History   Marital status: Single    Spouse name: Not on file   Number of children: 0   Years of education: Not on file   Highest education level: Not on file  Occupational History   Occupation: Owns motorcycle shop  Tobacco Use   Smoking status: Never   Smokeless tobacco: Never  Vaping Use   Vaping status: Never Used  Substance and Sexual Activity   Alcohol use: Yes    Comment: rare   Drug use: No   Sexual activity: Not on file  Other Topics Concern   Not on file  Social History Narrative   Not on file   Social Drivers of Health   Financial Resource Strain: Not on file  Food Insecurity: No Food Insecurity (11/17/2023)   Hunger Vital Sign    Worried About Running Out of Food in the Last Year: Never true    Ran Out of Food in the Last Year: Never true  Transportation Needs: No Transportation Needs (11/17/2023)   PRAPARE - Administrator, Civil Service (Medical): No    Lack of Transportation (Non-Medical): No  Physical Activity: Not on file  Stress: Not on file  Social Connections: Not on file  Intimate Partner Violence: Not At Risk (11/17/2023)   Humiliation, Afraid, Rape, and Kick questionnaire    Fear of Current or Ex-Partner: No    Emotionally Abused: No    Physically Abused:  No    Sexually Abused: No    Family History:    Family History  Problem Relation Age of Onset   Diabetes Father    Hypertension Father    Heart failure Father      ROS:  Please see the history of present illness.  Able to obtain additional review of systems.  Patient unconscious.  Physical Exam/Data: Vitals:   12/16/23 1510 12/16/23 1525 12/16/23 1535 12/16/23 1600  BP: (!) 125/91 102/65 108/69 115/76  Pulse: (!) 54 74 (!) 154 (!) 115  Resp: (!) 27 (!) 46 (!) 42 (!) 24  SpO2: 90% 93% 100% 94%    Intake/Output Summary (Last 24 hours) at 12/16/2023 1641 Last data filed at 12/16/2023 1539 Gross per 24 hour  Intake 1000 ml  Output --  Net 1000 ml       11/17/2023    3:02 PM 11/17/2023    6:37 AM 11/11/2023    9:14 AM  Last 3 Weights  Weight (lbs) 226 lb 3.1 oz 226 lb 226 lb  Weight (kg) 102.6 kg 102.513 kg 102.513 kg     There is no height or weight on file to calculate BMI.  General: Sedated/comatose.  Obese well nourished, well developed.  He is intubated. HEENT: normal Neck: no JVD Vascular: No carotid bruits; Distal pulses 2+ bilaterally Cardiac:  normal S1, S2; RRR; no murmur  Lungs:  clear to auscultation bilaterally, no wheezing, rhonchi or rales  Abd: Markedly distended with signs of possible peritoneal irritation, no bowel sounds heard Ext: no edema Musculoskeletal:  No deformities, BUE and BLE strength normal and equal Skin: warm and dry  Neuro:  CNs 2-12 intact, no focal abnormalities noted Psych:  Normal affect   EKG:  The EKG was personally reviewed and demonstrates: Narrow complex irregular tachycardia, most likely atrial flutter with variable AV block.  Possible atrial fibrillation.  Inferior Q waves (old), incomplete right bundle branch block Telemetry:  Telemetry was personally reviewed and demonstrates: Episode of monomorphic ventricular tachycardia, spontaneously resolved  Relevant CV Studies: Echocardiogram 05/10/2022: Impressions: 1. Inferior basal hypokinesis . Left ventricular ejection fraction, by  estimation, is 50 to 55%. The left ventricle has low normal function. The  left ventricle demonstrates regional wall motion abnormalities (see  scoring diagram/findings for  description). Left ventricular diastolic parameters were normal.   2. Right ventricular systolic function is normal. The right ventricular  size is normal.   3. Left atrial size was mildly dilated.   4. The mitral valve is abnormal. Trivial mitral valve regurgitation. No  evidence of mitral stenosis.   5. The aortic valve is tricuspid. There is mild calcification of the  aortic valve. Aortic valve regurgitation is not visualized. Aortic  valve  sclerosis is present, with no evidence of aortic valve stenosis.   6. The inferior vena cava is normal in size with greater than 50%  respiratory variability, suggesting right atrial pressure of 3 mmHg.  _______________   Carotid Ultrasound 05/10/2022: Impressions: - Right Carotid: Velocities in the right ICA are consistent with a 1-39% stenosis.  - Left Carotid: Velocities in the left ICA are consistent with a 1-39% stenosis.  - Vertebrals: Bilateral vertebral arteries demonstrate antegrade flow.  - Subclavians: Normal flow hemodynamics were seen in bilateral subclavian arteries.  _______________   Cardiac Catheterization 05/10/2022:   Prox RCA to Mid RCA lesion is 100% stenosed.   1st Mrg lesion is 99% stenosed.   Mid Cx to Dist Cx lesion is 99% stenosed.  Ost LAD to Prox LAD lesion is 70% stenosed.   Mid LAD lesion is 100% stenosed.   1st Diag lesion is 99% stenosed.   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 Severe disease in a moderate caliber Diagonal branch that arises prior to the total LAD occlusion.  Large caliber Circumflex with diffuse severe distal stenosis leading into the most distal obtuse marginal branch. (Functional CTO). The first obtuse marginal branch is a moderate caliber vessel with severe ostial stenosis.  The RCA is a large dominant artery with chronic total occlusion of the mid vessel The distal vessel fills from right to right collaterals and left to right collaterals.    Recommendations; He has severe three vessel CAD with chronic occlusion of the mid RCA, mid LAD and distal Circumflex with severe disease in the early obtuse marginal branch and in the diagonal branch. Bypass surgery appears to be the best method for revascularization. Will ask CT surgery to see him today to discuss bypass. Will resume IV heparin  2 hours post sheath pull. Continue ASA, statin and beta  blocker.    Diagnostic Dominance: Right      Event Monitor 07/02/2022 to 07/31/2022: Sinus bradycardia, normal sinus rhythm, sinus tachycardia, 4 beats nonsustained tachycardia.     Laboratory Data: High Sensitivity Troponin:   Recent Labs  Lab 12/16/23 1524  TROPONINIHS 82*     Chemistry Recent Labs  Lab 12/16/23 1524 12/16/23 1535  NA 129* 130*  K 2.8* 2.8*  CL 96* 96*  CO2 16*  --   GLUCOSE 118* 114*  BUN 55* 50*  CREATININE 3.92* 4.10*  CALCIUM  6.3*  --   MG 2.3  --   GFRNONAA 17*  --   ANIONGAP 17*  --     Recent Labs  Lab 12/16/23 1524  PROT 5.2*  ALBUMIN  1.6*  AST 68*  ALT 37  ALKPHOS 69  BILITOT 3.4*   Lipids  Recent Labs  Lab 12/16/23 1526  TRIG 145    Hematology Recent Labs  Lab 12/16/23 1524 12/16/23 1535  WBC 6.0  --   RBC 4.65  --   HGB 12.6* 12.9*  HCT 37.1* 38.0*  MCV 79.8*  --   MCH 27.1  --   MCHC 34.0  --   RDW 13.7  --   PLT 324  --    Thyroid No results for input(s): TSH, FREET4 in the last 168 hours.  BNPNo results for input(s): BNP, PROBNP in the last 168 hours.  DDimer No results for input(s): DDIMER in the last 168 hours.  Radiology/Studies:  DG Chest Port 1 View Result Date: 12/16/2023 CLINICAL DATA:  [id number] Encounter for central line placement [id number] EXAM: PORTABLE CHEST - 1 VIEW COMPARISON:  12/16/2023, 07/16/2023 FINDINGS: Similarly positioned endotracheal and esophagogastric tubes. Interval placement of a right IJ approach central venous catheter, which terminates at the cavoatrial junction. Improved aeration of the lungs. Retrocardiac airspace opacities. No pleural effusion or pneumothorax. No cardiomegaly. Sternotomy wires and CABG markers. Defibrillator pads. IMPRESSION: 1. Interval placement of a right IJ approach central venous catheter, which terminates at the cavoatrial junction. No pneumothorax. 2. Improved aeration of the lungs with retrocardiac airspace opacities, possibly atelectasis or aspiration.  Electronically Signed   By: Rogelia Myers M.D.   On: 12/16/2023 16:28   DG Chest Port 1 View Result Date: 12/16/2023 CLINICAL DATA:  Central line and ET tube placement EXAM:  PORTABLE CHEST - 1 VIEW COMPARISON:  July 16, 2023 FINDINGS: Endotracheal tube terminates in the mid trachea. Esophagogastric tube terminates in the region of the stomach. Overlying defibrillator pads. Markedly low lung volumes. No focal airspace consolidation, pleural effusion, or pneumothorax. No cardiomegaly. IMPRESSION: 1. Well-positioned endotracheal and esophagogastric tubes. No pneumothorax. 2. Markedly low lung volumes.  No pneumonia or pleural effusion. Electronically Signed   By: Rogelia Myers M.D.   On: 12/16/2023 16:26     Assessment and Plan: VT: Recurrent monomorphic wide-complex tachycardia strongly suggestive of VT and associated with hemodynamic instability.  This occurred in the setting of critical illness and major metabolic abnormalities.  Treated with intravenous amiodarone .  Check echocardiogram.  At this point no reason to suspect acute coronary syndrome based on ECG and history. AFib: Previously identified as a transient postoperative arrhythmia, no recurrence on subsequent clinical monitoring.  He was no longer on anticoagulant medication.  Unable to give beta-blockers right now with hemodynamic instability. Shock: Possible due to abdominal emergency with septic shock, cannot exclude component of cardiogenic shock at this point.  Support with intravenous norepinephrine .  Seems to have a serious intra-abdominal pathology.  CT abdomen pending. AKI: The degree of elevation in creatinine suggest that he has been ill for more than just a few hours, most likely for a few days. CAD: Less than 2 years since bypass surgery.  To my knowledge she has not had any clinical manifestations of coronary insufficiency since bypass.  He was taking aspirin , high intensity statin and beta-blocker. HLP/preDM   Risk  Assessment/Risk Scores:         CHA2DS2-VASc Score = 2   This indicates a 2.2% annual risk of stroke. The patient's score is based upon: CHF History: 0 HTN History: 1 Diabetes History: 0 Stroke History: 0 Vascular Disease History: 1 Age Score: 0 Gender Score: 0      CRITICAL CARE Performed by: Jerel Mikelle Myrick   Total critical care time: 45 minutes  Critical care time was exclusive of separately billable procedures and treating other patients.  Critical care was necessary to treat or prevent imminent or life-threatening deterioration.  Critical care was time spent personally by me on the following activities: development of treatment plan with patient and/or surrogate as well as nursing, discussions with consultants, evaluation of patient's response to treatment, examination of patient, obtaining history from patient or surrogate, ordering and performing treatments and interventions, ordering and review of laboratory studies, ordering and review of radiographic studies, pulse oximetry and re-evaluation of patient's condition.   For questions or updates, please contact [facility] Please consult [email or web address] for contact info under      Signed, Jerel Balding, MD  12/16/2023 4:41 PM

## 2023-12-16 NOTE — Progress Notes (Signed)
 Pt was transported from ER and [id number] without complications.

## 2023-12-16 NOTE — Procedures (Signed)
 Arterial Catheter Insertion Procedure Note  Glenn Stewart  [id number]  10-Aug-1965  Date:12/16/23  Time:9:15 PM    Provider Performing: Tinnie FORBES Furth    Procedure: Insertion of Arterial Line (63379) with US  guidance (23062)   Indication(s) Blood pressure monitoring and/or need for frequent ABGs  Consent Unable to obtain consent due to emergent nature of procedure.  Anesthesia None   Time Out Verified patient identification, verified procedure, site/side was marked, verified correct patient position, special equipment/implants available, medications/allergies/relevant history reviewed, required imaging and test results available.   Sterile Technique Maximal sterile technique including full sterile barrier drape, hand hygiene, sterile gown, sterile gloves, mask, hair covering, sterile ultrasound probe cover (if used).   Procedure Description Area of catheter insertion was cleaned with chlorhexidine  and draped in sterile fashion. With real-time ultrasound guidance an arterial catheter was placed into the right radial artery.  Appropriate arterial tracings confirmed on monitor.     Complications/Tolerance None; patient tolerated the procedure well.   EBL Minimal   Specimen(s) None   Tinnie FORBES Furth, PA-C [facility] 12/16/23 9:16 PM  Please see [email or web address] for pager details.  From 7A-7P if no response, please call [phone] After hours, please call ELink [phone]

## 2023-12-16 NOTE — Progress Notes (Signed)
 Pt was transported from 25M-04 to CT 1 and back without any complications.

## 2023-12-16 NOTE — ED Provider Notes (Signed)
 Clinical Course as of 12/16/23 1800  Fri Dec 16, 2023  1621 Patient intubated, HR 100's and intermittently NSR and A Fib, BP stable on low-dose levophed .  He remains hypoxic on vent with Peep now 12, FIO2 100%, ppressures around 30, xray shows low lung volume but no PTX.  High concern for thrombus/PE - will proceed with contrasted CT scan, including abdomen which appears rigid on exam. [MT]  1654 Critical care consulted [MT]  1706 In brief this is a 58 year old male with a history of significant coronary disease and a prostatectomy 1 month ago. [MT]  1730 Radiologist reports pt has terminal ileum wall thickening and SBO, abscesses in right lower quadrant - likely inflammatory with perforation [MT]  1800 Dr Teresa consulted - gen surgery - requested uro consult and I have spoken to [facility] from urology, who requested CT cystogram to evaluate for leak.  I relayed this information to the ICU team now in charge of the patient's care. [MT]    Clinical Course User Index [MT] Yussef Jorge, Donnice PARAS, MD   Discussed with CT technician plan to proceed with CT contrast scan   Cottie Donnice PARAS, MD 12/16/23 [phone]

## 2023-12-16 NOTE — Progress Notes (Signed)
 eLink Physician-Brief Progress Note Patient Name: Glenn Stewart DOB: [birth year] MRN: [id number]   Date of Service  12/16/2023  HPI/Events of Note  Cystogram report reviewed, suggestion of perforation.  eICU Interventions  Charge RN requested to notify urology of abnormal finding and request that they review the images to determine next steps.        Alleene Stoy U Cherylynn Liszewski 12/16/2023, 11:31 PM

## 2023-12-17 ENCOUNTER — Inpatient Hospital Stay (HOSPITAL_COMMUNITY)

## 2023-12-17 ENCOUNTER — Encounter (HOSPITAL_COMMUNITY): Payer: Self-pay

## 2023-12-17 DIAGNOSIS — R579 Shock, unspecified: Secondary | ICD-10-CM | POA: Diagnosis not present

## 2023-12-17 DIAGNOSIS — E66812 Obesity, class 2: Secondary | ICD-10-CM

## 2023-12-17 DIAGNOSIS — J9601 Acute respiratory failure with hypoxia: Secondary | ICD-10-CM

## 2023-12-17 DIAGNOSIS — I4891 Unspecified atrial fibrillation: Secondary | ICD-10-CM | POA: Diagnosis not present

## 2023-12-17 DIAGNOSIS — R578 Other shock: Secondary | ICD-10-CM

## 2023-12-17 DIAGNOSIS — E8721 Acute metabolic acidosis: Secondary | ICD-10-CM | POA: Diagnosis not present

## 2023-12-17 DIAGNOSIS — K651 Peritoneal abscess: Secondary | ICD-10-CM

## 2023-12-17 DIAGNOSIS — I472 Ventricular tachycardia, unspecified: Secondary | ICD-10-CM | POA: Diagnosis not present

## 2023-12-17 DIAGNOSIS — Z9079 Acquired absence of other genital organ(s): Secondary | ICD-10-CM | POA: Insufficient documentation

## 2023-12-17 DIAGNOSIS — D649 Anemia, unspecified: Secondary | ICD-10-CM

## 2023-12-17 DIAGNOSIS — N179 Acute kidney failure, unspecified: Secondary | ICD-10-CM

## 2023-12-17 LAB — RENAL FUNCTION PANEL
Albumin: <1.5 g/dL — ABNORMAL LOW (ref 3.5–5.0)
Anion gap: 20 — ABNORMAL HIGH (ref 5–15)
BUN: 86 mg/dL — ABNORMAL HIGH (ref 6–20)
CO2: 18 mmol/L — ABNORMAL LOW (ref 22–32)
Calcium: 7.2 mg/dL — ABNORMAL LOW (ref 8.9–10.3)
Chloride: 88 mmol/L — ABNORMAL LOW (ref 98–111)
Creatinine, Ser: 5.83 mg/dL — ABNORMAL HIGH (ref 0.61–1.24)
GFR, Estimated: 10 mL/min — ABNORMAL LOW (ref >60)
Glucose, Bld: 117 mg/dL — ABNORMAL HIGH (ref 70–99)
Phosphorus: 10.2 mg/dL — ABNORMAL HIGH (ref 2.5–4.6)
Potassium: 4.5 mmol/L (ref 3.5–5.1)
Sodium: 126 mmol/L — ABNORMAL LOW (ref 135–145)

## 2023-12-17 LAB — BASIC METABOLIC PANEL WITH GFR
Anion gap: 21 — ABNORMAL HIGH (ref 5–15)
BUN: 74 mg/dL — ABNORMAL HIGH (ref 6–20)
CO2: 17 mmol/L — ABNORMAL LOW (ref 22–32)
Calcium: 7 mg/dL — ABNORMAL LOW (ref 8.9–10.3)
Chloride: 87 mmol/L — ABNORMAL LOW (ref 98–111)
Creatinine, Ser: 5.13 mg/dL — ABNORMAL HIGH (ref 0.61–1.24)
GFR, Estimated: 12 mL/min — ABNORMAL LOW (ref >60)
Glucose, Bld: 121 mg/dL — ABNORMAL HIGH (ref 70–99)
Potassium: 4.3 mmol/L (ref 3.5–5.1)
Sodium: 125 mmol/L — ABNORMAL LOW (ref 135–145)

## 2023-12-17 LAB — HEMOGLOBIN A1C
Hgb A1c MFr Bld: 6 % — ABNORMAL HIGH (ref 4.8–5.6)
Mean Plasma Glucose: 125.5 mg/dL

## 2023-12-17 LAB — POCT I-STAT 7, (LYTES, BLD GAS, ICA,H+H)
Acid-base deficit: 6 mmol/L — ABNORMAL HIGH (ref 0.0–2.0)
Acid-base deficit: 5 mmol/L — ABNORMAL HIGH (ref 0.0–2.0)
Acid-base deficit: 6 mmol/L — ABNORMAL HIGH (ref 0.0–2.0)
Bicarbonate: 20.6 mmol/L (ref 20.0–28.0)
Bicarbonate: 20.1 mmol/L (ref 20.0–28.0)
Bicarbonate: 18.8 mmol/L — ABNORMAL LOW (ref 20.0–28.0)
Calcium, Ion: 0.87 mmol/L — CL (ref 1.15–1.40)
Calcium, Ion: 0.9 mmol/L — ABNORMAL LOW (ref 1.15–1.40)
Calcium, Ion: 0.89 mmol/L — CL (ref 1.15–1.40)
HCT: 38 % — ABNORMAL LOW (ref 39.0–52.0)
HCT: 35 % — ABNORMAL LOW (ref 39.0–52.0)
HCT: 32 % — ABNORMAL LOW (ref 39.0–52.0)
Hemoglobin: 12.9 g/dL — ABNORMAL LOW (ref 13.0–17.0)
Hemoglobin: 11.9 g/dL — ABNORMAL LOW (ref 13.0–17.0)
Hemoglobin: 10.9 g/dL — ABNORMAL LOW (ref 13.0–17.0)
O2 Saturation: 97 %
O2 Saturation: 98 %
O2 Saturation: 97 %
Patient temperature: 36.7
Patient temperature: 37
Patient temperature: 37
Potassium: 4.5 mmol/L (ref 3.5–5.1)
Potassium: 4.5 mmol/L (ref 3.5–5.1)
Potassium: 4.4 mmol/L (ref 3.5–5.1)
Sodium: 123 mmol/L — ABNORMAL LOW (ref 135–145)
Sodium: 123 mmol/L — ABNORMAL LOW (ref 135–145)
Sodium: 123 mmol/L — ABNORMAL LOW (ref 135–145)
TCO2: 22 mmol/L (ref 22–32)
TCO2: 21 mmol/L — ABNORMAL LOW (ref 22–32)
TCO2: 20 mmol/L — ABNORMAL LOW (ref 22–32)
pCO2 arterial: 42.5 mmHg (ref 32–48)
pCO2 arterial: 36.8 mmHg (ref 32–48)
pCO2 arterial: 34.2 mmHg (ref 32–48)
pH, Arterial: 7.292 — ABNORMAL LOW (ref 7.35–7.45)
pH, Arterial: 7.346 — ABNORMAL LOW (ref 7.35–7.45)
pH, Arterial: 7.349 — ABNORMAL LOW (ref 7.35–7.45)
pO2, Arterial: 95 mmHg (ref 83–108)
pO2, Arterial: 116 mmHg — ABNORMAL HIGH (ref 83–108)
pO2, Arterial: 91 mmHg (ref 83–108)

## 2023-12-17 LAB — HEPATIC FUNCTION PANEL
ALT: 43 U/L (ref 0–44)
AST: 83 U/L — ABNORMAL HIGH (ref 15–41)
Albumin: 1.5 g/dL — ABNORMAL LOW (ref 3.5–5.0)
Alkaline Phosphatase: 85 U/L (ref 38–126)
Bilirubin, Direct: 2.6 mg/dL — ABNORMAL HIGH (ref 0.0–0.2)
Indirect Bilirubin: 1.3 mg/dL — ABNORMAL HIGH (ref 0.3–0.9)
Total Bilirubin: 3.9 mg/dL — ABNORMAL HIGH (ref 0.0–1.2)
Total Protein: 5.6 g/dL — ABNORMAL LOW (ref 6.5–8.1)

## 2023-12-17 LAB — RAPID URINE DRUG SCREEN, HOSP PERFORMED
Amphetamines: NOT DETECTED
Barbiturates: NOT DETECTED
Benzodiazepines: NOT DETECTED
Cocaine: NOT DETECTED
Opiates: NOT DETECTED
Tetrahydrocannabinol: NOT DETECTED

## 2023-12-17 LAB — CBC
HCT: 35.4 % — ABNORMAL LOW (ref 39.0–52.0)
Hemoglobin: 12.1 g/dL — ABNORMAL LOW (ref 13.0–17.0)
MCH: 26.9 pg (ref 26.0–34.0)
MCHC: 34.2 g/dL (ref 30.0–36.0)
MCV: 78.7 fL — ABNORMAL LOW (ref 80.0–100.0)
Platelets: 303 K/uL (ref 150–400)
RBC: 4.5 MIL/uL (ref 4.22–5.81)
RDW: 14.1 % (ref 11.5–15.5)
WBC: 4.8 K/uL (ref 4.0–10.5)
nRBC: 1.9 % — ABNORMAL HIGH (ref 0.0–0.2)

## 2023-12-17 LAB — MAGNESIUM: Magnesium: 2.9 mg/dL — ABNORMAL HIGH (ref 1.7–2.4)

## 2023-12-17 LAB — URINALYSIS, W/ REFLEX TO CULTURE (INFECTION SUSPECTED)
Bilirubin Urine: NEGATIVE
Glucose, UA: NEGATIVE mg/dL
Ketones, ur: NEGATIVE mg/dL
Nitrite: NEGATIVE
Protein, ur: 30 mg/dL — AB
Specific Gravity, Urine: 1.012 (ref 1.005–1.030)
WBC, UA: >50 WBC/hpf (ref 0–5)
pH: 5 (ref 5.0–8.0)

## 2023-12-17 LAB — GLUCOSE, CAPILLARY
Glucose-Capillary: 106 mg/dL — ABNORMAL HIGH (ref 70–99)
Glucose-Capillary: 114 mg/dL — ABNORMAL HIGH (ref 70–99)
Glucose-Capillary: 123 mg/dL — ABNORMAL HIGH (ref 70–99)
Glucose-Capillary: 131 mg/dL — ABNORMAL HIGH (ref 70–99)
Glucose-Capillary: 139 mg/dL — ABNORMAL HIGH (ref 70–99)
Glucose-Capillary: 144 mg/dL — ABNORMAL HIGH (ref 70–99)

## 2023-12-17 LAB — COOXEMETRY PANEL
Carboxyhemoglobin: 1.2 % (ref 0.5–1.5)
Methemoglobin: <0.7 % (ref 0.0–1.5)
O2 Saturation: 77.7 %
Total hemoglobin: 11.5 g/dL — ABNORMAL LOW (ref 12.0–16.0)

## 2023-12-17 LAB — TRIGLYCERIDES: Triglycerides: 271 mg/dL — ABNORMAL HIGH (ref <150)

## 2023-12-17 LAB — CREATININE, FLUID (PLEURAL, PERITONEAL, JP DRAINAGE): Creat, Fluid: 4.9 mg/dL

## 2023-12-17 LAB — PHOSPHORUS: Phosphorus: 11.7 mg/dL — ABNORMAL HIGH (ref 2.5–4.6)

## 2023-12-17 MED ORDER — SODIUM CHLORIDE 0.9% FLUSH
5.0000 mL | Freq: Three times a day (TID) | INTRAVENOUS | Status: DC
  Administered 2023-12-17 – 2024-01-02 (×47): 5 mL

## 2023-12-17 MED ORDER — PIPERACILLIN-TAZOBACTAM 3.375 G IVPB
3.3750 g | Freq: Four times a day (QID) | INTRAVENOUS | Status: DC
  Administered 2023-12-17 – 2023-12-19 (×8): 3.375 g via INTRAVENOUS
  Filled 2023-12-17 (×8): qty 50

## 2023-12-17 MED ORDER — PRISMASOL BGK 4/2.5 32-4-2.5 MEQ/L EC SOLN: Status: DC

## 2023-12-17 MED ORDER — SODIUM CHLORIDE 0.9 % FOR CRRT: INTRAVENOUS_CENTRAL | Status: DC | PRN

## 2023-12-17 MED ORDER — HEPARIN SODIUM (PORCINE) 1000 UNIT/ML DIALYSIS
1000.0000 [IU] | INTRAMUSCULAR | Status: DC | PRN
  Administered 2023-12-20: 3000 [IU] via INTRAVENOUS_CENTRAL
  Administered 2023-12-20: 2400 [IU] via INTRAVENOUS_CENTRAL
  Filled 2023-12-17: qty 6
  Filled 2023-12-17: qty 3
  Filled 2023-12-17: qty 6
  Filled 2023-12-17: qty 3

## 2023-12-17 MED ORDER — CALCIUM GLUCONATE-NACL 1-0.675 GM/50ML-% IV SOLN
1.0000 g | Freq: Once | INTRAVENOUS | Status: AC
  Administered 2023-12-17: 1000 mg via INTRAVENOUS
  Filled 2023-12-17: qty 50

## 2023-12-17 MED ORDER — DEXTROSE IN LACTATED RINGERS 5 % IV SOLN
INTRAVENOUS | Status: DC
Start: 2023-12-17 — End: 2023-12-19

## 2023-12-17 MED ORDER — INSULIN ASPART 100 UNIT/ML IJ SOLN
0.0000 [IU] | INTRAMUSCULAR | Status: DC
  Administered 2023-12-17 – 2023-12-18 (×7): 2 [IU] via SUBCUTANEOUS
  Administered 2023-12-18: 3 [IU] via SUBCUTANEOUS
  Administered 2023-12-18 – 2023-12-19 (×3): 2 [IU] via SUBCUTANEOUS

## 2023-12-17 MED ORDER — SODIUM BICARBONATE 8.4 % IV SOLN
INTRAVENOUS | Status: DC
  Filled 2023-12-17: qty 1000

## 2023-12-17 MED ORDER — BISACODYL 10 MG RE SUPP
10.0000 mg | Freq: Every day | RECTAL | Status: DC
  Administered 2023-12-17 – 2023-12-27 (×7): 10 mg via RECTAL
  Filled 2023-12-17 (×11): qty 1

## 2023-12-17 MED ORDER — CHLORHEXIDINE GLUCONATE CLOTH 2 % EX PADS
6.0000 | MEDICATED_PAD | CUTANEOUS | Status: DC
  Administered 2023-12-17 – 2023-12-23 (×7): 6 via TOPICAL

## 2023-12-17 MED ORDER — HEPARIN SODIUM (PORCINE) 5000 UNIT/ML IJ SOLN
5000.0000 [IU] | Freq: Three times a day (TID) | INTRAMUSCULAR | Status: DC
  Administered 2023-12-17 – 2024-01-02 (×47): 5000 [IU] via SUBCUTANEOUS
  Filled 2023-12-17 (×47): qty 1

## 2023-12-17 MED ORDER — PIPERACILLIN-TAZOBACTAM IN DEX 2-0.25 GM/50ML IV SOLN: 2.2500 g | Freq: Three times a day (TID) | INTRAVENOUS | Status: DC

## 2023-12-17 MED ORDER — PIPERACILLIN-TAZOBACTAM 3.375 G IVPB: 3.3750 g | Freq: Four times a day (QID) | INTRAVENOUS | Status: DC

## 2023-12-17 MED ORDER — LIDOCAINE HCL 1 % IJ SOLN
INTRAMUSCULAR | Status: AC
  Filled 2023-12-17: qty 10

## 2023-12-17 MED ORDER — SODIUM BICARBONATE 8.4 % IV SOLN
50.0000 meq | Freq: Once | INTRAVENOUS | Status: AC
  Administered 2023-12-17: 50 meq via INTRAVENOUS
  Filled 2023-12-17: qty 50

## 2023-12-17 MED ORDER — LACTATED RINGERS IV BOLUS
1000.0000 mL | Freq: Once | INTRAVENOUS | Status: AC
  Administered 2023-12-17: 1000 mL via INTRAVENOUS

## 2023-12-17 MED ORDER — LIDOCAINE 1 % OPTIME INJ - NO CHARGE
10.0000 mL | Freq: Once | INTRAMUSCULAR | Status: AC
  Administered 2023-12-17: 10 mL via INTRADERMAL
  Filled 2023-12-17: qty 10

## 2023-12-17 MED ORDER — LIDOCAINE HCL (PF) 1 % IJ SOLN
10.0000 mL | Freq: Once | INTRAMUSCULAR | Status: AC
  Administered 2023-12-17: 10 mL via INTRADERMAL

## 2023-12-17 MED ORDER — CALCIUM GLUCONATE-NACL 2-0.675 GM/100ML-% IV SOLN
2.0000 g | Freq: Once | INTRAVENOUS | Status: AC
  Administered 2023-12-17: 2000 mg via INTRAVENOUS
  Filled 2023-12-17: qty 100

## 2023-12-17 NOTE — Consult Note (Shared)
 Urology Consult   Physician requesting consult: Dr. Tamela   Reason for consult: abdominal abscesses s/p RALP   History of Present Illness: Glenn Stewart is a 58 y.o. with hx of CAD and CABG x5, Afib (not on AC), BL carotid stenosis, HTN, HLD who with altered mental status and arrhthymias. On arrival to ED, he developed VT and was hemodynamically unstable and underwent cardioversion. Ultimately required intubation while in the Trauma bay.   Initial labs demonstrate multiple abnormalities including elevated Cr to 4.1 (baseline ~1), MA, and hypokalemia. Hemoglobin appears stable ~12-13. Lactate of 3.3>3.5. CTAP ultimately obtained that demonstrates multiple large abdominal and pelvic abscesses. Bladder appears decompressed without Foley catheter. Foley catheter has since been placed with minimal to no output.   Urologically, has history of RALP on 11/17/23 Glenn Stewart). Operative course was overall uncomplicated.   Update in HPI: Upon additional review of [facility]ro[facility] and discussion with colleagues who had seen him as outpatient, patient self-irrigated catheter a couple days after initial surgery and then removed catheter himself prior to follow-up at 1 week. Patient reported that he cut tubing prior to removal but unable to confirm if removal was truly atraumatic. At initial postop visits, he otherwise did not complain of pain, fever/chills, or abdominal distention. Was having regular BM\s at that time.  Past Medical History:  Diagnosis Date   ADHD (attention deficit hyperactivity disorder)    Anxiety state, unspecified    Bipolar 1 disorder (HCC)    Cancer (HCC)    CHEST PAIN 12/04/2008   Qualifier: Diagnosis of  By: [name] MD, [name]    Coronary artery disease    Depressive disorder, not elsewhere classified    Dermatophytosis of nail    Esophageal reflux    History of kidney stones    HYPERGLYCEMIA 07/10/2007   Qualifier: Diagnosis of  By: [name] MD, [name]    Low back pain 09/18/2013    Other chronic nonalcoholic liver disease    Pre-diabetes    Problems with hearing    Prostatitis, unspecified    Pure hypercholesterolemia    Screen for STD (sexually transmitted disease) 04/20/2011   Sprain of cruciate ligament of knee    Tear of medial cartilage or meniscus of knee, current    TMJ (temporomandibular joint disorder) 12/25/2010   Unspecified essential hypertension    Unspecified gastritis and gastroduodenitis without mention of hemorrhage    Urethritis, unspecified     Past Surgical History:  Procedure Laterality Date   CORONARY ARTERY BYPASS GRAFT N/A 05/12/2022   Procedure: CORONARY ARTERY BYPASS GRAFTING (CABG) X FIVE BYPASSES USING OPEN LEFT INTERNAL MAMMARY ARTERY, OPEN LEFT RADIAL ARTERY, AND ENDOSCOPIC RIGHT GREATER SAPHENOUS VEIN HARVEST.;  Surgeon: [name], MD;  Location: [facility] OR;  Service: Open Heart Surgery;  Laterality: N/A;   CYSTOSCOPY W/ LITHOLAPAXY / EHL     LEFT HEART CATH AND CORONARY ANGIOGRAPHY N/A 05/10/2022   Procedure: LEFT HEART CATH AND CORONARY ANGIOGRAPHY;  Surgeon: [name], MD;  Location: [facility] INVASIVE CV LAB;  Service: Cardiovascular;  Laterality: N/A;   No prior surgery     RADIAL ARTERY HARVEST Left 05/12/2022   Procedure: RADIAL ARTERY HARVEST;  Surgeon: [name], MD;  Location: [facility] OR;  Service: Open Heart Surgery;  Laterality: Left;   ROBOT ASSISTED LAPAROSCOPIC RADICAL PROSTATECTOMY N/A 11/17/2023   Procedure: PROSTATECTOMY, RADICAL, ROBOT-ASSISTED, LAPAROSCOPIC;  Surgeon: [name], MD;  Location: [facility] ORS;  Service: Urology;  Laterality: N/A;  ROBOTIC RADICAL PROSTATECTOMY AND  BILATERAL PELVIC LYMPH NODE DISSECTION   TEE WITHOUT CARDIOVERSION N/A 05/12/2022   Procedure: TRANSESOPHAGEAL ECHOCARDIOGRAM;  Surgeon: [name], MD;  Location: [facility] OR;  Service: Open Heart Surgery;  Laterality: N/A;    Current Hospital Medications:  Home Meds:  No current facility-administered  medications on file prior to encounter.   Current Outpatient Medications on File Prior to Encounter  Medication Sig Dispense Refill   acetaminophen  (TYLENOL ) 325 MG tablet Take 2 tablets (650 mg total) by mouth every 4 (four) hours as needed for moderate pain or headache.     ALPRAZolam  (XANAX ) 1 MG tablet Take 1 mg by mouth Once daily as needed for anxiety.     amLODipine  (NORVASC ) 5 MG tablet Take 1 tablet (5 mg total) by mouth daily. 90 tablet 3   atorvastatin  (LIPITOR) 80 MG tablet Take 1 tablet (80 mg total) by mouth daily. 90 tablet 3   ciprofloxacin  (CIPRO ) 500 MG tablet Take 1 tablet (500 mg total) by mouth 2 (two) times daily. Start day prior to follow up appt for catheter removal 6 tablet 0   docusate sodium  (COLACE) 100 MG capsule Take 1 capsule (100 mg total) by mouth 2 (two) times daily.     isosorbide  mononitrate (IMDUR ) 30 MG 24 hr tablet Take 1 tablet (30 mg total) by mouth daily. 90 tablet 3   metoprolol  tartrate (LOPRESSOR ) 25 MG tablet TAKE ONE TABLET BY MOUTH TWICE DAILY 180 tablet 0   pantoprazole  (PROTONIX ) 20 MG tablet Take 1 tablet (20 mg total) by mouth daily. 30 tablet 0   sertraline  (ZOLOFT ) 100 MG tablet Take 100 mg by mouth daily.     traMADol  (ULTRAM ) 50 MG tablet Take 1-2 tablets (50-100 mg total) by mouth every 6 (six) hours as needed for moderate pain (pain score 4-6) or severe pain (pain score 7-10). 20 tablet 0     Scheduled Meds:  amiodarone   150 mg Intravenous Once   Chlorhexidine  Gluconate Cloth  6 each Topical Daily   docusate  100 mg Per Tube BID   fentaNYL  (SUBLIMAZE ) injection  25-50 mcg Intravenous Once   mouth rinse  15 mL Mouth Rinse Q2H   pantoprazole  (PROTONIX ) IV  40 mg Intravenous Daily   polyethylene glycol  17 g Per Tube Daily   Continuous Infusions:  sodium chloride      amiodarone  30 mg/hr (12/17/23 0600)   fentaNYL  infusion INTRAVENOUS 125 mcg/hr (12/17/23 0600)   linezolid  (ZYVOX ) IV     norepinephrine  (LEVOPHED ) Adult infusion 4  mcg/min (12/17/23 0600)   piperacillin -tazobactam (ZOSYN )  IV Stopped (12/17/23 0419)   propofol  (DIPRIVAN ) infusion 20 mcg/kg/min (12/17/23 0600)   PRN Meds:.Place/Maintain arterial line **AND** sodium chloride , fentaNYL , mouth rinse  Allergies:  Allergies  Allergen Reactions   Sulfonamide Derivatives Hives    Family History  Problem Relation Age of Onset   Diabetes Father    Hypertension Father    Heart failure Father     Social History:  reports that he has never smoked. He has never used smokeless tobacco. He reports current alcohol use. He reports that he does not use drugs.  ROS: A complete review of systems was performed.  All systems are negative except for pertinent findings as noted.  Physical Exam:  Vital signs in last 24 hours: Temp:  [95.9 F (35.5 C)-98.7 F (37.1 C)] 98.1 F (36.7 C) (09/27 0645) Pulse Rate:  [54-154] 95 (09/27 0645) Resp:  [18-46] 28 (09/27 0645) BP: (84-128)/(42-91) 114/78 (09/27 0645) SpO2:  [  86 %-100 %] 97 % (09/27 0645) Arterial Line BP: (96-137)/(55-72) 112/67 (09/27 0645) FiO2 (%):  [90 %-100 %] 90 % (09/27 0400) Weight:  [103.8 kg] 103.8 kg (09/27 0414) Constitutional:  Intubated and sedated  Cardiovascular: tachycardic  Respiratory: Intubated  GI: Abdomen is quite distended and rigid. Incisions are well healing.  GU: Foley catheter in place with no output  Laboratory Data:  Recent Labs    12/16/23 1524 12/16/23 1535 12/16/23 1710 12/16/23 2124 12/17/23 0333 12/17/23 0422  WBC 6.0  --   --   --  4.8  --   HGB 12.6* 12.9* 12.6* 11.9* 12.1* 12.9*  HCT 37.1* 38.0* 37.0* 35.0* 35.4* 38.0*  PLT 324  --   --   --  303  --     Recent Labs    12/16/23 1524 12/16/23 1535 12/16/23 1710 12/16/23 2023 12/16/23 2124 12/17/23 0333 12/17/23 0422  NA 129* 130* 123* 124* 121* 125* 123*  K 2.8* 2.8* 3.4* 3.7 4.0 4.3 4.5  CL 96* 96*  --  89*  --  87*  --   GLUCOSE 118* 114*  --  137*  --  121*  --   BUN 55* 50*  --  66*  --   74*  --   CALCIUM  6.3*  --   --  6.9*  --  7.0*  --   CREATININE 3.92* 4.10*  --  4.45*  --  5.13*  --      Results for orders placed or performed during the hospital encounter of 12/16/23 (from the past 24 hours)  Comprehensive metabolic panel     Status: Abnormal   Collection Time: 12/16/23  3:24 PM  Result Value Ref Range   Sodium 129 (L) 135 - 145 mmol/L   Potassium 2.8 (L) 3.5 - 5.1 mmol/L   Chloride 96 (L) 98 - 111 mmol/L   CO2 16 (L) 22 - 32 mmol/L   Glucose, Bld 118 (H) 70 - 99 mg/dL   BUN 55 (H) 6 - 20 mg/dL   Creatinine, Ser 6.07 (H) 0.61 - 1.24 mg/dL   Calcium  6.3 (LL) 8.9 - 10.3 mg/dL   Total Protein 5.2 (L) 6.5 - 8.1 g/dL   Albumin  1.6 (L) 3.5 - 5.0 g/dL   AST 68 (H) 15 - 41 U/L   ALT 37 0 - 44 U/L   Alkaline Phosphatase 69 38 - 126 U/L   Total Bilirubin 3.4 (H) 0.0 - 1.2 mg/dL   GFR, Estimated 17 (L) >60 mL/min   Anion gap 17 (H) 5 - 15  CBC     Status: Abnormal   Collection Time: 12/16/23  3:24 PM  Result Value Ref Range   WBC 6.0 4.0 - 10.5 K/uL   RBC 4.65 4.22 - 5.81 MIL/uL   Hemoglobin 12.6 (L) 13.0 - 17.0 g/dL   HCT 62.8 (L) 60.9 - 47.9 %   MCV 79.8 (L) 80.0 - 100.0 fL   MCH 27.1 26.0 - 34.0 pg   MCHC 34.0 30.0 - 36.0 g/dL   RDW 86.2 88.4 - 84.4 %   Platelets 324 150 - 400 K/uL   nRBC 5.7 (H) 0.0 - 0.2 %  APTT     Status: None   Collection Time: 12/16/23  3:24 PM  Result Value Ref Range   aPTT 29 24 - 36 seconds  Protime-INR     Status: Abnormal   Collection Time: 12/16/23  3:24 PM  Result Value Ref Range   Prothrombin Time  16.3 (H) 11.4 - 15.2 seconds   INR 1.2 0.8 - 1.2  Magnesium      Status: None   Collection Time: 12/16/23  3:24 PM  Result Value Ref Range   Magnesium  2.3 1.7 - 2.4 mg/dL  Phosphorus     Status: Abnormal   Collection Time: 12/16/23  3:24 PM  Result Value Ref Range   Phosphorus 7.3 (H) 2.5 - 4.6 mg/dL  Troponin I (High Sensitivity)     Status: Abnormal   Collection Time: 12/16/23  3:24 PM  Result Value Ref Range    Troponin I (High Sensitivity) 82 (H) <18 ng/L  Triglycerides     Status: None   Collection Time: 12/16/23  3:26 PM  Result Value Ref Range   Triglycerides 145 <150 mg/dL  I-stat chem 8, ED (not at [facility], [facility] or [facility])     Status: Abnormal   Collection Time: 12/16/23  3:35 PM  Result Value Ref Range   Sodium 130 (L) 135 - 145 mmol/L   Potassium 2.8 (L) 3.5 - 5.1 mmol/L   Chloride 96 (L) 98 - 111 mmol/L   BUN 50 (H) 6 - 20 mg/dL   Creatinine, Ser 5.89 (H) 0.61 - 1.24 mg/dL   Glucose, Bld 885 (H) 70 - 99 mg/dL   Calcium , Ion 0.79 (LL) 1.15 - 1.40 mmol/L   TCO2 17 (L) 22 - 32 mmol/L   Hemoglobin 12.9 (L) 13.0 - 17.0 g/dL   HCT 61.9 (L) 60.9 - 47.9 %   Comment NOTIFIED PHYSICIAN   I-Stat Lactic Acid, ED     Status: Abnormal   Collection Time: 12/16/23  3:38 PM  Result Value Ref Range   Lactic Acid, Venous 3.3 (HH) 0.5 - 1.9 mmol/L   Comment NOTIFIED PHYSICIAN   I-Stat CG4 Lactic Acid     Status: Abnormal   Collection Time: 12/16/23  4:00 PM  Result Value Ref Range   Lactic Acid, Venous 3.5 (HH) 0.5 - 1.9 mmol/L   Comment NOTIFIED PHYSICIAN   I-Stat arterial blood gas, ED     Status: Abnormal   Collection Time: 12/16/23  5:10 PM  Result Value Ref Range   pH, Arterial 7.219 (L) 7.35 - 7.45   pCO2 arterial 54.3 (H) 32 - 48 mmHg   pO2, Arterial 74 (L) 83 - 108 mmHg   Bicarbonate 22.3 20.0 - 28.0 mmol/L   TCO2 24 22 - 32 mmol/L   O2 Saturation 92 %   Acid-base deficit 6.0 (H) 0.0 - 2.0 mmol/L   Sodium 123 (L) 135 - 145 mmol/L   Potassium 3.4 (L) 3.5 - 5.1 mmol/L   Calcium , Ion 0.97 (L) 1.15 - 1.40 mmol/L   HCT 37.0 (L) 39.0 - 52.0 %   Hemoglobin 12.6 (L) 13.0 - 17.0 g/dL   Patient temperature 02.0 F    Sample type ARTERIAL   CBG monitoring, ED     Status: Abnormal   Collection Time: 12/16/23  5:20 PM  Result Value Ref Range   Glucose-Capillary 137 (H) 70 - 99 mg/dL  Glucose, capillary     Status: Abnormal   Collection Time: 12/16/23  6:24 PM  Result Value Ref Range    Glucose-Capillary 157 (H) 70 - 99 mg/dL  MRSA Next Gen by PCR, Nasal     Status: None   Collection Time: 12/16/23  6:39 PM   Specimen: Nasal Mucosa; Nasal Swab  Result Value Ref Range   MRSA by PCR Next Gen NOT DETECTED NOT DETECTED  HIV  Antibody (routine testing w rflx)     Status: None   Collection Time: 12/16/23  6:39 PM  Result Value Ref Range   HIV Screen 4th Generation wRfx Non Reactive Non Reactive  Lactic acid, plasma     Status: None   Collection Time: 12/16/23  6:39 PM  Result Value Ref Range   Lactic Acid, Venous 1.2 0.5 - 1.9 mmol/L  Troponin I (High Sensitivity)     Status: Abnormal   Collection Time: 12/16/23  6:39 PM  Result Value Ref Range   Troponin I (High Sensitivity) 95 (H) <18 ng/L  Glucose, capillary     Status: Abnormal   Collection Time: 12/16/23  7:52 PM  Result Value Ref Range   Glucose-Capillary 136 (H) 70 - 99 mg/dL  Basic metabolic panel     Status: Abnormal   Collection Time: 12/16/23  8:23 PM  Result Value Ref Range   Sodium 124 (L) 135 - 145 mmol/L   Potassium 3.7 3.5 - 5.1 mmol/L   Chloride 89 (L) 98 - 111 mmol/L   CO2 19 (L) 22 - 32 mmol/L   Glucose, Bld 137 (H) 70 - 99 mg/dL   BUN 66 (H) 6 - 20 mg/dL   Creatinine, Ser 5.54 (H) 0.61 - 1.24 mg/dL   Calcium  6.9 (L) 8.9 - 10.3 mg/dL   GFR, Estimated 14 (L) >60 mL/min   Anion gap 16 (H) 5 - 15  Lactic acid, plasma     Status: None   Collection Time: 12/16/23  8:23 PM  Result Value Ref Range   Lactic Acid, Venous 1.3 0.5 - 1.9 mmol/L  I-STAT 7, (LYTES, BLD GAS, ICA, H+H)     Status: Abnormal   Collection Time: 12/16/23  9:24 PM  Result Value Ref Range   pH, Arterial 7.301 (L) 7.35 - 7.45   pCO2 arterial 41.3 32 - 48 mmHg   pO2, Arterial 102 83 - 108 mmHg   Bicarbonate 20.4 20.0 - 28.0 mmol/L   TCO2 22 22 - 32 mmol/L   O2 Saturation 97 %   Acid-base deficit 6.0 (H) 0.0 - 2.0 mmol/L   Sodium 121 (L) 135 - 145 mmol/L   Potassium 4.0 3.5 - 5.1 mmol/L   Calcium , Ion 0.95 (L) 1.15 - 1.40  mmol/L   HCT 35.0 (L) 39.0 - 52.0 %   Hemoglobin 11.9 (L) 13.0 - 17.0 g/dL   Patient temperature 63.3 C    Sample type ARTERIAL   Glucose, capillary     Status: Abnormal   Collection Time: 12/16/23 11:08 PM  Result Value Ref Range   Glucose-Capillary 133 (H) 70 - 99 mg/dL  Glucose, capillary     Status: Abnormal   Collection Time: 12/17/23  3:20 AM  Result Value Ref Range   Glucose-Capillary 123 (H) 70 - 99 mg/dL  Basic metabolic panel     Status: Abnormal   Collection Time: 12/17/23  3:33 AM  Result Value Ref Range   Sodium 125 (L) 135 - 145 mmol/L   Potassium 4.3 3.5 - 5.1 mmol/L   Chloride 87 (L) 98 - 111 mmol/L   CO2 17 (L) 22 - 32 mmol/L   Glucose, Bld 121 (H) 70 - 99 mg/dL   BUN 74 (H) 6 - 20 mg/dL   Creatinine, Ser 4.86 (H) 0.61 - 1.24 mg/dL   Calcium  7.0 (L) 8.9 - 10.3 mg/dL   GFR, Estimated 12 (L) >60 mL/min   Anion gap 21 (H) 5 - 15  CBC  Status: Abnormal   Collection Time: 12/17/23  3:33 AM  Result Value Ref Range   WBC 4.8 4.0 - 10.5 K/uL   RBC 4.50 4.22 - 5.81 MIL/uL   Hemoglobin 12.1 (L) 13.0 - 17.0 g/dL   HCT 64.5 (L) 60.9 - 47.9 %   MCV 78.7 (L) 80.0 - 100.0 fL   MCH 26.9 26.0 - 34.0 pg   MCHC 34.2 30.0 - 36.0 g/dL   RDW 85.8 88.4 - 84.4 %   Platelets 303 150 - 400 K/uL   nRBC 1.9 (H) 0.0 - 0.2 %  Hepatic function panel     Status: Abnormal   Collection Time: 12/17/23  3:33 AM  Result Value Ref Range   Total Protein 5.6 (L) 6.5 - 8.1 g/dL   Albumin  1.5 (L) 3.5 - 5.0 g/dL   AST 83 (H) 15 - 41 U/L   ALT 43 0 - 44 U/L   Alkaline Phosphatase 85 38 - 126 U/L   Total Bilirubin 3.9 (H) 0.0 - 1.2 mg/dL   Bilirubin, Direct 2.6 (H) 0.0 - 0.2 mg/dL   Indirect Bilirubin 1.3 (H) 0.3 - 0.9 mg/dL  Magnesium      Status: Abnormal   Collection Time: 12/17/23  3:33 AM  Result Value Ref Range   Magnesium  2.9 (H) 1.7 - 2.4 mg/dL  Phosphorus     Status: Abnormal   Collection Time: 12/17/23  3:33 AM  Result Value Ref Range   Phosphorus 11.7 (H) 2.5 - 4.6 mg/dL   Triglycerides     Status: Abnormal   Collection Time: 12/17/23  3:34 AM  Result Value Ref Range   Triglycerides 271 (H) <150 mg/dL  I-STAT 7, (LYTES, BLD GAS, ICA, H+H)     Status: Abnormal   Collection Time: 12/17/23  4:22 AM  Result Value Ref Range   pH, Arterial 7.292 (L) 7.35 - 7.45   pCO2 arterial 42.5 32 - 48 mmHg   pO2, Arterial 95 83 - 108 mmHg   Bicarbonate 20.6 20.0 - 28.0 mmol/L   TCO2 22 22 - 32 mmol/L   O2 Saturation 97 %   Acid-base deficit 6.0 (H) 0.0 - 2.0 mmol/L   Sodium 123 (L) 135 - 145 mmol/L   Potassium 4.5 3.5 - 5.1 mmol/L   Calcium , Ion 0.87 (LL) 1.15 - 1.40 mmol/L   HCT 38.0 (L) 39.0 - 52.0 %   Hemoglobin 12.9 (L) 13.0 - 17.0 g/dL   Patient temperature 63.2 C    Sample type ARTERIAL    Recent Results (from the past 240 hours)  MRSA Next Gen by PCR, Nasal     Status: None   Collection Time: 12/16/23  6:39 PM   Specimen: Nasal Mucosa; Nasal Swab  Result Value Ref Range Status   MRSA by PCR Next Gen NOT DETECTED NOT DETECTED Final    Comment: (NOTE) The GeneXpert MRSA Assay (FDA approved for NASAL specimens only), is one component of a comprehensive MRSA colonization surveillance program. It is not intended to diagnose MRSA infection nor to guide or monitor treatment for MRSA infections. Test performance is not FDA approved in patients less than 65 years old. Performed at [facility], [address] [place], [place] [address]     Renal Function: Recent Labs    12/16/23 1524 12/16/23 1535 12/16/23 2023 12/17/23 0333  CREATININE 3.92* 4.10* 4.45* 5.13*   Estimated Creatinine Clearance: 17.2 mL/min (A) (by C-G formula based on SCr of 5.13 mg/dL (H)).  Radiologic Imaging: DG  Chest Port 1 View Result Date: 12/17/2023 EXAM: 1 VIEW(S) XRAY OF THE CHEST 12/17/2023 05:48:00 AM COMPARISON: 12/16/2023 CLINICAL HISTORY: Respiratory failure; Hx of: chest pain, coronary artery disease; Chief complaint: Loss of consciousness FINDINGS: LINES, TUBES  AND DEVICES: Right IJ central venous catheter in place with tip over the SVC. ETT in place with tip 3.7 cm above the carina. Enteric tube in place, courses below the diaphragm with tip obscured. Overlying defibrillator pads. LUNGS AND PLEURA: Low lung volumes. Mild right basilar atelectasis. Similar left retrocardiac airspace opacity, likely atelectasis. No pulmonary edema. No pleural effusion. No pneumothorax. HEART AND MEDIASTINUM: No acute abnormality of the cardiac and mediastinal silhouettes. BONES AND SOFT TISSUES: Median sternotomy and CABG changes noted. IMPRESSION: 1. Low lung volumes with mild basilar atelectasis/retrocardiac atelectasis. 2. Support devices appropriately positioned, including right IJ central venous catheter with tip in the SVC, endotracheal tube 3.7 cm above the carina, and enteric tube coursing below the diaphragm. Electronically signed by: Waddell Calk MD 12/17/2023 06:09 AM EDT RP Workstation: [id number]   CT CYSTOGRAM ABD/PELVIS Result Date: 12/16/2023 CLINICAL DATA:  Sepsis with concern for bladder perforation. EXAM: CT CYSTOGRAM (CT ABDOMEN AND PELVIS WITH CONTRAST) TECHNIQUE: Multi-detector CT imaging through the abdomen and pelvis was performed after dilute contrast had been introduced into the bladder for the purposes of performing CT cystography. RADIATION DOSE REDUCTION: This exam was performed according to the departmental dose-optimization program which includes automated exposure control, adjustment of the mA and/or kV according to patient size and/or use of iterative reconstruction technique. CONTRAST:  50mL OMNIPAQUE  IOHEXOL  300 MG/ML  SOLN COMPARISON:  12/16/2023 FINDINGS: Lower chest: Atelectasis or consolidation in both lung bases. Hepatobiliary: Cholelithiasis.  No bile duct dilatation. Pancreas: Unremarkable. No pancreatic ductal dilatation or surrounding inflammatory changes. Spleen: Normal in size without focal abnormality. Adrenals/Urinary Tract: Left adrenal  gland nodules. See previous description from prior CT earlier today. Kidneys are symmetrical. No hydronephrosis or hydroureter. Contrast material fills the bladder. Foley catheter in the bladder. No bladder wall thickening. Contrast material is demonstrated leaking along the catheter through the urethra to the distal penile region. There is focal irregularity at the junction of the urethra with the bladder base with small anterior contrast pooling suggesting contained leak. No contrast extravasation to suggest intraperitoneal leakage. Remainder the urethra appears otherwise intact. Stomach/Bowel: Enteric tube tip in the stomach. Dilated fluid-filled small bowel demonstrates some decompression since previous study. Colon is decompressed. Persistent wall thickening in the terminal ileum and colon. Vascular/Lymphatic: Aortic atherosclerosis. No enlarged abdominal or pelvic lymph nodes. Reproductive: Prostate gland is surgically absent. No significant pelvic lymphadenopathy. Other: Multiple loculated fluid collections demonstrated in the right abdomen and pelvis as previously discussed. Small amount of free fluid. Diffuse stranding throughout the intra-abdominal fat. Changes are consistent with multiple abscesses. Musculoskeletal: No acute bony abnormalities. No focal bone lesions. IMPRESSION: 1. CT cystogram with Foley catheter in place. Contrast material leaks along the catheter through the urethra. There is focal irregularity at the junction of the bladder base and urethra with small anterior contrast pooling suggesting contained perforation. No intraperitoneal contrast extravasation. 2. Appearance examination is otherwise unchanged. Again demonstrated are multiple loculated fluid collections in the abdomen and pelvis consistent with abscesses. Wall thickening of the cecum and terminal ileum. Fluid-filled dilated small bowel appear mildly improved. Electronically Signed   By: Elsie Gravely M.D.   On: 12/16/2023  22:41   CT Angio Chest Pulmonary Embolism (PE) W or WO Contrast Result Date: 12/16/2023 CLINICAL DATA:  Aortic  aneurysm suspected. Severe right-sided abdominal pain and abdominal distention. Decreased oxygen saturation. EXAM: CT ANGIOGRAPHY CHEST CT ABDOMEN AND PELVIS WITH CONTRAST TECHNIQUE: Multidetector CT imaging of the chest was performed using the standard protocol during bolus administration of intravenous contrast. Multiplanar CT image reconstructions and MIPs were obtained to evaluate the vascular anatomy. Multidetector CT imaging of the abdomen and pelvis was performed using the standard protocol during bolus administration of intravenous contrast. RADIATION DOSE REDUCTION: This exam was performed according to the departmental dose-optimization program which includes automated exposure control, adjustment of the mA and/or kV according to patient size and/or use of iterative reconstruction technique. CONTRAST:  75mL OMNIPAQUE  IOHEXOL  350 MG/ML SOLN COMPARISON:  Chest radiograph 12/16/2023.  PET-CT 06/23/2023 FINDINGS: CTA CHEST FINDINGS Cardiovascular: Cardiac enlargement. No pericardial effusions. Normal caliber thoracic aorta. No aortic dissection. Calcification of the aorta and coronary arteries. Great vessel origins are patent. Postoperative changes consistent with coronary bypass. No evidence of significant pulmonary embolus. Mediastinum/Nodes: Enteric tube with tip in the stomach. Esophagus is decompressed. Endotracheal tube with tip above the carina. Thyroid gland is unremarkable. Scattered mediastinal lymph nodes are not pathologically enlarged, likely reactive. Lungs/Pleura: Consolidation or atelectasis in both lung bases, possibly pneumonia, compressive atelectasis, or aspiration. Small bilateral pleural effusions. No pneumothorax. Musculoskeletal: Degenerative changes in the spine. Sternotomy wires. No acute bony abnormalities. Review of the MIP images confirms the above findings. CT ABDOMEN  and PELVIS FINDINGS Hepatobiliary: No focal liver lesions. Cholelithiasis with several stones in the gallbladder. No wall thickening or inflammatory stranding. No bile duct dilatation. Pancreas: Unremarkable. No pancreatic ductal dilatation or surrounding inflammatory changes. Spleen: Normal in size without focal abnormality. Adrenals/Urinary Tract: Left adrenal gland nodule measuring 2.1 cm diameter. Sixty-five Hounsfield unit density measurement. The lesions are unchanged since prior unenhanced study from 12/18/2020 at which time the density measurement was -3 Hounsfield units. This is consistent with a benign fat containing adenoma and no imaging follow-up is indicated. Kidneys are symmetrical and homogeneous. No hydronephrosis or hydroureter. Bladder is decompressed. Stomach/Bowel: Stomach is unremarkable. Colon is decompressed. Wall thickening of the ascending colon and hepatic flexure, new since prior study. This may indicate colitis or inflammatory bowel disease versus reactive change. Dilated fluid-filled small bowel with air-fluid levels and mild diffuse wall thickening. Terminal ileum is decompressed. This is consistent with small-bowel obstruction, possibly due to inflammatory strictures. Vascular/Lymphatic: Aortic atherosclerosis. No enlarged abdominal or pelvic lymph nodes. Reproductive: Prostate is unremarkable. Other: Diffuse infiltration throughout the right-sided abdominal and pelvic mesenteric fat. Multiple loculated fluid collections are demonstrated throughout the right abdomen and pelvis. A right pelvic collection measures 7.4 cm diameter. A right anterior lower quadrant collection measures 8.8 cm diameter. A right anterior para psoas collection measures 9.4 cm diameter. Multiple smaller right upper quadrant collections are demonstrated. Some collections contains small gas bubbles. This appearance is likely indicate multiple abscesses, possibly due to perforation. Could also consider a  perforated appendicitis. Small amount of free fluid in the abdomen, likely ascites or reactive fluid. No free intra-abdominal gas. Musculoskeletal: Degenerative changes in the spine. No acute bony abnormalities. Review of the MIP images confirms the above findings. IMPRESSION: 1. Fluid-filled distended small bowel with decompressed terminal ileum consistent with small-bowel obstruction. 2. Irregular wall thickening diffusely throughout the distal small bowel and right colon suggesting enterocolitis or inflammatory bowel disease. Inflammatory strictures may be present. 3. Multiple loculated fluid collections, some with gas, demonstrated in the right abdomen and pelvis indicating abscesses. This is likely due to perforation, possibly related to  inflammatory bowel disease or to an occult perforated appendicitis. The appendix is self is not demonstrated. No free intra-abdominal air. 4. Small amount of free fluid in the abdomen is likely reactive. 5. Cholelithiasis without evidence of acute cholecystitis. 6. Aortic atherosclerosis. 7. Consolidation versus compressive atelectasis in both lung bases. Small bilateral pleural effusions. Critical Value/emergent results were called by telephone at the time of interpretation on 12/16/2023 at 5:28 pm to provider MATTHEW TRIFAN , who verbally acknowledged these results. Electronically Signed   By: Elsie Gravely M.D.   On: 12/16/2023 17:33   CT ABDOMEN PELVIS W CONTRAST Result Date: 12/16/2023 CLINICAL DATA:  Aortic aneurysm suspected. Severe right-sided abdominal pain and abdominal distention. Decreased oxygen saturation. EXAM: CT ANGIOGRAPHY CHEST CT ABDOMEN AND PELVIS WITH CONTRAST TECHNIQUE: Multidetector CT imaging of the chest was performed using the standard protocol during bolus administration of intravenous contrast. Multiplanar CT image reconstructions and MIPs were obtained to evaluate the vascular anatomy. Multidetector CT imaging of the abdomen and pelvis was  performed using the standard protocol during bolus administration of intravenous contrast. RADIATION DOSE REDUCTION: This exam was performed according to the departmental dose-optimization program which includes automated exposure control, adjustment of the mA and/or kV according to patient size and/or use of iterative reconstruction technique. CONTRAST:  75mL OMNIPAQUE  IOHEXOL  350 MG/ML SOLN COMPARISON:  Chest radiograph 12/16/2023.  PET-CT 06/23/2023 FINDINGS: CTA CHEST FINDINGS Cardiovascular: Cardiac enlargement. No pericardial effusions. Normal caliber thoracic aorta. No aortic dissection. Calcification of the aorta and coronary arteries. Great vessel origins are patent. Postoperative changes consistent with coronary bypass. No evidence of significant pulmonary embolus. Mediastinum/Nodes: Enteric tube with tip in the stomach. Esophagus is decompressed. Endotracheal tube with tip above the carina. Thyroid gland is unremarkable. Scattered mediastinal lymph nodes are not pathologically enlarged, likely reactive. Lungs/Pleura: Consolidation or atelectasis in both lung bases, possibly pneumonia, compressive atelectasis, or aspiration. Small bilateral pleural effusions. No pneumothorax. Musculoskeletal: Degenerative changes in the spine. Sternotomy wires. No acute bony abnormalities. Review of the MIP images confirms the above findings. CT ABDOMEN and PELVIS FINDINGS Hepatobiliary: No focal liver lesions. Cholelithiasis with several stones in the gallbladder. No wall thickening or inflammatory stranding. No bile duct dilatation. Pancreas: Unremarkable. No pancreatic ductal dilatation or surrounding inflammatory changes. Spleen: Normal in size without focal abnormality. Adrenals/Urinary Tract: Left adrenal gland nodule measuring 2.1 cm diameter. Sixty-five Hounsfield unit density measurement. The lesions are unchanged since prior unenhanced study from 12/18/2020 at which time the density measurement was -3 Hounsfield  units. This is consistent with a benign fat containing adenoma and no imaging follow-up is indicated. Kidneys are symmetrical and homogeneous. No hydronephrosis or hydroureter. Bladder is decompressed. Stomach/Bowel: Stomach is unremarkable. Colon is decompressed. Wall thickening of the ascending colon and hepatic flexure, new since prior study. This may indicate colitis or inflammatory bowel disease versus reactive change. Dilated fluid-filled small bowel with air-fluid levels and mild diffuse wall thickening. Terminal ileum is decompressed. This is consistent with small-bowel obstruction, possibly due to inflammatory strictures. Vascular/Lymphatic: Aortic atherosclerosis. No enlarged abdominal or pelvic lymph nodes. Reproductive: Prostate is unremarkable. Other: Diffuse infiltration throughout the right-sided abdominal and pelvic mesenteric fat. Multiple loculated fluid collections are demonstrated throughout the right abdomen and pelvis. A right pelvic collection measures 7.4 cm diameter. A right anterior lower quadrant collection measures 8.8 cm diameter. A right anterior para psoas collection measures 9.4 cm diameter. Multiple smaller right upper quadrant collections are demonstrated. Some collections contains small gas bubbles. This appearance is likely indicate multiple abscesses,  possibly due to perforation. Could also consider a perforated appendicitis. Small amount of free fluid in the abdomen, likely ascites or reactive fluid. No free intra-abdominal gas. Musculoskeletal: Degenerative changes in the spine. No acute bony abnormalities. Review of the MIP images confirms the above findings. IMPRESSION: 1. Fluid-filled distended small bowel with decompressed terminal ileum consistent with small-bowel obstruction. 2. Irregular wall thickening diffusely throughout the distal small bowel and right colon suggesting enterocolitis or inflammatory bowel disease. Inflammatory strictures may be present. 3. Multiple  loculated fluid collections, some with gas, demonstrated in the right abdomen and pelvis indicating abscesses. This is likely due to perforation, possibly related to inflammatory bowel disease or to an occult perforated appendicitis. The appendix is self is not demonstrated. No free intra-abdominal air. 4. Small amount of free fluid in the abdomen is likely reactive. 5. Cholelithiasis without evidence of acute cholecystitis. 6. Aortic atherosclerosis. 7. Consolidation versus compressive atelectasis in both lung bases. Small bilateral pleural effusions. Critical Value/emergent results were called by telephone at the time of interpretation on 12/16/2023 at 5:28 pm to provider MATTHEW TRIFAN , who verbally acknowledged these results. Electronically Signed   By: Elsie Gravely M.D.   On: 12/16/2023 17:33   CT HEAD WO CONTRAST Result Date: 12/16/2023 CLINICAL DATA:  Altered mental status. EXAM: CT HEAD WITHOUT CONTRAST TECHNIQUE: Contiguous axial images were obtained from the base of the skull through the vertex without intravenous contrast. RADIATION DOSE REDUCTION: This exam was performed according to the departmental dose-optimization program which includes automated exposure control, adjustment of the mA and/or kV according to patient size and/or use of iterative reconstruction technique. COMPARISON:  May 10, 2022 FINDINGS: Brain: No evidence of acute infarction, hemorrhage, hydrocephalus, extra-axial collection or mass lesion/mass effect. There are areas of mildly decreased attenuation within the white matter tracts of the supratentorial brain, consistent with microvascular disease changes. Vascular: No hyperdense vessel or unexpected calcification. Skull: Normal. Negative for fracture or focal lesion. Sinuses/Orbits: No acute finding. Other: None. IMPRESSION: No acute intracranial pathology. Electronically Signed   By: Suzen Dials M.D.   On: 12/16/2023 17:17   DG Chest Port 1 View Result Date:  12/16/2023 CLINICAL DATA:  [id number] Encounter for central line placement [id number] EXAM: PORTABLE CHEST - 1 VIEW COMPARISON:  12/16/2023, 07/16/2023 FINDINGS: Similarly positioned endotracheal and esophagogastric tubes. Interval placement of a right IJ approach central venous catheter, which terminates at the cavoatrial junction. Improved aeration of the lungs. Retrocardiac airspace opacities. No pleural effusion or pneumothorax. No cardiomegaly. Sternotomy wires and CABG markers. Defibrillator pads. IMPRESSION: 1. Interval placement of a right IJ approach central venous catheter, which terminates at the cavoatrial junction. No pneumothorax. 2. Improved aeration of the lungs with retrocardiac airspace opacities, possibly atelectasis or aspiration. Electronically Signed   By: Rogelia Myers M.D.   On: 12/16/2023 16:28   DG Chest Port 1 View Result Date: 12/16/2023 CLINICAL DATA:  Central line and ET tube placement EXAM: PORTABLE CHEST - 1 VIEW COMPARISON:  July 16, 2023 FINDINGS: Endotracheal tube terminates in the mid trachea. Esophagogastric tube terminates in the region of the stomach. Overlying defibrillator pads. Markedly low lung volumes. No focal airspace consolidation, pleural effusion, or pneumothorax. No cardiomegaly. IMPRESSION: 1. Well-positioned endotracheal and esophagogastric tubes. No pneumothorax. 2. Markedly low lung volumes.  No pneumonia or pleural effusion. Electronically Signed   By: Rogelia Myers M.D.   On: 12/16/2023 16:26    I independently reviewed the above imaging studies.  Impression 31M with complex cardiac history who  presented with arrhythmias and AMS ultimately requiring emergent intubation in the ED. CTAP with multiple intra-abdominal abscesses concerning for possible perforation, though urine leak cannot be definitively ruled out based on this scan alone. CT Cystogram obtained and demonstrates likely anastomotic leak at bladder neck.      Currently remains on pressors,  intubated and sedated in the ICU.  Recommendations - Continue Foley catheter.  - Proceed with drainage by VIR.  - Appreciate excellent care from MICU and recommendations from general surgery   Lyle LOISE Civil 12/17/2023, 6:47 AM

## 2023-12-17 NOTE — Progress Notes (Signed)
 RT Note: Rt transported patient on ventilator from room 23M-04 to CT for IR procedure and back to room 23M-04 without complications.

## 2023-12-17 NOTE — Progress Notes (Signed)
 Progress Note  Patient Name: Glenn Stewart Date of Encounter: 12/17/2023  Primary Cardiologist: Redell Shallow, MD   Subjective   Intubated, sedated  Inpatient Medications    Scheduled Meds:  amiodarone   150 mg Intravenous Once   Chlorhexidine  Gluconate Cloth  6 each Topical Daily   docusate  100 mg Per Tube BID   fentaNYL  (SUBLIMAZE ) injection  25-50 mcg Intravenous Once   mouth rinse  15 mL Mouth Rinse Q2H   pantoprazole  (PROTONIX ) IV  40 mg Intravenous Daily   polyethylene glycol  17 g Per Tube Daily   Continuous Infusions:  sodium chloride      amiodarone  30 mg/hr (12/17/23 0600)   fentaNYL  infusion INTRAVENOUS 125 mcg/hr (12/17/23 0600)   linezolid  (ZYVOX ) IV     norepinephrine  (LEVOPHED ) Adult infusion 4 mcg/min (12/17/23 0600)   piperacillin -tazobactam (ZOSYN )  IV Stopped (12/17/23 0419)   propofol  (DIPRIVAN ) infusion 20 mcg/kg/min (12/17/23 0600)   PRN Meds: Place/Maintain arterial line **AND** sodium chloride , fentaNYL , mouth rinse   Vital Signs    Vitals:   12/17/23 0600 12/17/23 0615 12/17/23 0630 12/17/23 0645  BP: 110/70 107/69 112/72 114/78  Pulse: 95 96 97 95  Resp: (!) 28 (!) 28 (!) 28 (!) 28  Temp: 97.7 F (36.5 C) 97.9 F (36.6 C) 97.9 F (36.6 C) 98.1 F (36.7 C)  TempSrc:      SpO2: 97% 97% 97% 97%  Weight:      Height:        Intake/Output Summary (Last 24 hours) at 12/17/2023 0651 Last data filed at 12/17/2023 0600 Gross per 24 hour  Intake 4148.62 ml  Output 2350 ml  Net 1798.62 ml   Filed Weights   12/16/23 1907 12/17/23 0414  Weight: 103.8 kg 103.8 kg    Telemetry    Sinus rhythm. One brief (3-4 beat) run of NSVT; brief atrial run, non-sustained  - Personally Reviewed  ECG    No recent ECG available - Personally Reviewed  Physical Exam  GEN: No acute distress. Intubated, sedated Cardiac: RRR, no murmurs, rubs, or gallops.  Respiratory: ventilated. No appreciable rales, rhonchi anteriorly   Labs     Chemistry Recent Labs  Lab 12/16/23 1524 12/16/23 1535 12/16/23 1710 12/16/23 2023 12/16/23 2124 12/17/23 0333 12/17/23 0422  NA 129* 130*   < > 124* 121* 125* 123*  K 2.8* 2.8*   < > 3.7 4.0 4.3 4.5  CL 96* 96*  --  89*  --  87*  --   CO2 16*  --   --  19*  --  17*  --   GLUCOSE 118* 114*  --  137*  --  121*  --   BUN 55* 50*  --  66*  --  74*  --   CREATININE 3.92* 4.10*  --  4.45*  --  5.13*  --   CALCIUM  6.3*  --   --  6.9*  --  7.0*  --   PROT 5.2*  --   --   --   --  5.6*  --   ALBUMIN  1.6*  --   --   --   --  1.5*  --   AST 68*  --   --   --   --  83*  --   ALT 37  --   --   --   --  43  --   ALKPHOS 69  --   --   --   --  85  --   BILITOT 3.4*  --   --   --   --  3.9*  --   GFRNONAA 17*  --   --  14*  --  12*  --   ANIONGAP 17*  --   --  16*  --  21*  --    < > = values in this interval not displayed.     Hematology Recent Labs  Lab 12/16/23 1524 12/16/23 1535 12/16/23 2124 12/17/23 0333 12/17/23 0422  WBC 6.0  --   --  4.8  --   RBC 4.65  --   --  4.50  --   HGB 12.6*   < > 11.9* 12.1* 12.9*  HCT 37.1*   < > 35.0* 35.4* 38.0*  MCV 79.8*  --   --  78.7*  --   MCH 27.1  --   --  26.9  --   MCHC 34.0  --   --  34.2  --   RDW 13.7  --   --  14.1  --   PLT 324  --   --  303  --    < > = values in this interval not displayed.    Cardiac EnzymesNo results for input(s): TROPONINI in the last 168 hours. No results for input(s): TROPIPOC in the last 168 hours.   BNPNo results for input(s): BNP, PROBNP in the last 168 hours.   DDimer No results for input(s): DDIMER in the last 168 hours.   Summary of Pertinent studies    TTE: Pending   Imaging: Chest xray, CT scans reviewed  Cardiac cath 04/2022 -- reviewed severe three vessel disease   Patient Profile     58 y.o. male with history of CAD and CABG, paroxysmal atrial fibrillation who is being seen 12/16/2023 for the evaluation of ventricular tachycardia and atrial fibrillation at the request  of Dr. Cottie.   Assessment & Plan    VT Hemodynamically unstable wide-complex tachycardia on presentation In setting of acute, severe illness and electrolyte dysarray TTE pending Continue amiodarone  for now.  Atrial fibrillation History of post-op AF No AF on telemetry  Shock, abdominal abscess Per primary, surgical teams On levophed  Plan for IR drainage of abscess today  AKI Cr increasing, minimal UOP   CAD S/p CABG Feb 2024 Low suspicion for ACS this presentation    For questions or updates, please contact [facility] Please consult [email or web address] for contact info under Cardiology/STEMI.      Signed, Eulas FORBES Furbish, MD 12/17/2023, 6:51 AM

## 2023-12-17 NOTE — Progress Notes (Signed)
 Attempted Echocardiogram at 12.53pm Patient went to IR

## 2023-12-17 NOTE — Progress Notes (Signed)
 Echocardiogram 2D Echocardiogram has been performed.  Glenn Stewart 12/17/2023, 7:12 PM

## 2023-12-17 NOTE — ED Provider Notes (Signed)
.  Critical Care  Performed by: Cottie Donnice PARAS, MD Authorized by: Cottie Donnice PARAS, MD   Critical care provider statement:    Critical care time (minutes):  60   Critical care time was exclusive of:  Separately billable procedures and treating other patients   Critical care was necessary to treat or prevent imminent or life-threatening deterioration of the following conditions:  Circulatory failure and sepsis   Critical care was time spent personally by me on the following activities:  Ordering and performing treatments and interventions, ordering and review of laboratory studies, ordering and review of radiographic studies, pulse oximetry, review of old charts, examination of patient and evaluation of patient's response to treatment    In brief, this was a 58 year old gentleman w/ hx of recent prostatectomy, significant CAD s/p CABG, who presented in extremis with diaphoresis, hypotension, and tachycardia with EMS, and initial cardiac rhythm alternating between A Fib with RVR and V Tach.  Initially with GCS of 15 and MAP > 65 mmhg we elected to attempt medical management of his arrhythmias with IV fluids, IV magnesium , and IV amiodarone .  Subsequently he had longer episodes of V Tach with hypotension and he underwent 1 synchronized cardioversion. He was then intubated with RSI medications given concerns for worsening pain, encephalopathy, combativeness, and need for emergent imaging.  There was concern on initial presentation that the patient had a rigid and distended abdomen.  Therefore BS antibiotics were ordered.  After intubation, an NG tube was placed with copious amounts of stomach contents aspirated.  Patient was taken for CT imaging where there was concern for SBO, mutliple abdominal abscesses, possible GI perforation vs urological perforation.  I consulted general surgery and urology - see ED course - and the patient was admitted to the medical critical care team.  Cardiology was also  consulted for arrhythmia management, which we felt was likely a response to sepsis, not ACS.  The patient's labs noted that he had a significant AKI.  However, I had a very high clinical concern for PE as we were not able to adequate oxygenate the patient after intubation - despite appropriate ET tube placement, no evidence of PTX on exam, no elevated peak pressures to suggest obstruction or PTX.  I decided to proceed with contrasted CT imaging alongside aggressive IV hydration for his AKI.  We also repleated his potassium.  Initially, due to poor IV access, an IO was attempted in the left tibial plateau and failed to flush, then removed from the patient. After intubation, a central line was placed in the right internal jugular.   Cottie Donnice PARAS, MD 12/17/23 [phone]

## 2023-12-17 NOTE — Procedures (Signed)
 Vascular and Interventional Radiology Procedure Note  Patient: Glenn Stewart DOB: [birth year] Medical Record Number: [id number] Note Date/Time: 12/17/23 12:38 PM   Performing Physician: Thom Hall, MD Assistant(s): None  Diagnosis: RLQ fluid collection, Q abscess  Procedure: DRAINAGE CATHETER PLACEMENT into a RIGHT LOWER QUADRANT FLUID COLLECTION  Anesthesia: Conscious Sedation Complications: None Estimated Blood Loss: Minimal Specimens: Sent for Gram Stain, Aerobe Culture, Anerobe Culture, and Fluid creatinine  Findings:  Successful CT-guided placement of 14 F catheter into a RLQ fluid collection.  Plan:  - Flush drain with 5 mL Normal Saline every 8 hours. - Follow up drain evaluation / sinogram in 1 week(s).  See detailed procedure note with images in PACS. The patient tolerated the procedure well without incident or complication and was returned to ICU in stable condition.    Thom Hall, MD [facility] Rad[facility]   Pager. [phone] Clinic. [phone]

## 2023-12-17 NOTE — Progress Notes (Signed)
 12/17/2023  Glendia ONEIDA Paras [id number] 08-05-1965  CARE TEAM: PCP: Pcp, No  Outpatient Care Team: Patient Care Team: Pcp, No as PCP - General Crenshaw, Redell RAMAN, MD as PCP - Cardiology (Cardiology) [name], MD as Attending Physician (Urology)  Inpatient Treatment Team: Treatment Team:  Gatha Pence, MD Lbcardiology, Rounding, MD Pccm, Md, MD Ccs, Md, MD Watt Rush, MD Allyson Colander, RN Viverito, Steva LABOR, RN Dimitri David, NT Swist, Debbie, RN Nowell Sid LABOR, RN Jerona Lauraine BROCKS, RN [name], MD   Problem List:   Principal Problem:   Shock [facility]) Active Problems:   S/P CABG x 5   Prostate cancer [facility])   History of abdominal prostatectomy   Pelvic abscess in male [facility])   AKI (acute kidney injury)   Class 2 obesity with body mass index (BMI) of 35 to 39.9 without comorbidity  11/17/2023 Preoperative diagnosis:  Prostate Cancer    Postoperative diagnosis:  same    Procedure: Robotic assisted laparoscopic radical prostatectomy Bilateral pelvic lymph node dissection   Surgeon: Morene MICAEL Cam, MD      Assessment [facility] Stay = 1 days)      Multisystem organ failure with AKI 1 month after radical prostatectomy.  Most likely etiology is delayed urinoma secondarily infected    Plan:  Vent and shock control per critical care.  IV antibiotics.  Persistent/tazobactam seems appropriate.  I agreed to my view that the most dominant fluid collection is in the right lower quadrant that seems retroperitoneal and tracks down to the bladder.  Raises suspicion of urinoma.  Does not seem consistent with appendicitis or diverticulitis to my view.  Agree with interventional radiology drainage to help get under control.  I believe the bowel is secondarily thickened and irritated with more of an ileus than a true obstruction.  There is no evidence of any pneumatosis or free air or perforation that warrants laparotomy at this time.   In the absence of any frank bowel perforation/injury I think it would be a bad idea in this distended sick patient is operative risks are increased.  CT cystogram does not show any major bladder leak at this time with Foley in place.  Defer to them.  Consider nephrology consultation with persistent AKI.  Defer to urology critical care.  -monitor electrolytes & replace as needed  Keep K>4, Mg>2, Phos>3  -VTE prophylaxis- SCDs.  Anticoagulation prophyllaxis SQ as appropriate   Surgery will help follow given concerns of abnormal abdominal CT scan, but would proceed with recommendations above.   I reviewed nursing notes, ED provider notes, Consultant urology.  Critical care.  Cardiology.  notes, last 24 h vitals and pain scores, last 48 h intake and output, last 24 h labs and trends, and last 24 h imaging results.  I have reviewed this patient's available data, including medical history, events of note, test results, etc as part of my evaluation.   A significant portion of that time was spent in counseling. Care during the described time interval was provided by me.  This care required high  level of medical decision making.  12/17/2023    Subjective: (Chief complaint)  Intubated.  ICU nursing just outside room.  Objective:  Vital signs:  Vitals:   12/17/23 0705 12/17/23 0710 12/17/23 0715 12/17/23 0725  BP:      Pulse: 98 96 97   Resp: (!) 28 (!) 29 (!) 29   Temp: 98.1 F (36.7 C) 98.1 F (36.7 C) 98.2 F (36.8  C)   TempSrc:      SpO2: 97% 96% 97% 97%  Weight:      Height:        Last BM Date : 12/16/23 (per ED documentation)  Intake/Output   Yesterday:  09/26 0701 - 09/27 0700 In: 4193.7 [I.V.:2237.7; NG/GT:120; IV Piggyback:1836] Out: 2350 [Urine:100; Emesis/NG output:250; Stool:2000] This shift:  No intake/output data recorded.  Bowel function:  Flatus: No  BM:  No  Drain: NG tube with scant liquid brown   Physical Exam:  General: Pt intubated and  sedated.  Will open eyes to voice.  Somewhat follows commands.  moderate acute distress Eyes: PERRL, normal EOM.  Sclera clear.  No icterus Neuro: CN II-XII intact w/o focal sensory/motor deficits. Lymph: No head/neck/groin lymphadenopathy Psych: Sedated.  Not agitated. HENT: Normocephalic, Mucus membranes moist.  No thrush Neck: Supple, No tracheal deviation.  No obvious thyromegaly Chest: No pain to chest wall compression.  Good respiratory excursion.  No audible wheezing CV:  Pulses intact.  Regular rhythm.  No major extremity edema MS: Normal AROM mjr joints.  No obvious deformity  Abdomen: Rigid.  Very distended.  Nontender.  No obvious guarding to palpation but hard to tell given him being sedated.  No evidence of peritonitis.  No incarcerated hernias.  Ext:   No deformity.  No mjr edema.  No cyanosis Skin: No petechiae / purpurea.  No major sores.  Warm and dry    Results:   Cultures: Recent Results (from the past 720 hours)  MRSA Next Gen by PCR, Nasal     Status: None   Collection Time: 12/16/23  6:39 PM   Specimen: Nasal Mucosa; Nasal Swab  Result Value Ref Range Status   MRSA by PCR Next Gen NOT DETECTED NOT DETECTED Final    Comment: (NOTE) The GeneXpert MRSA Assay (FDA approved for NASAL specimens only), is one component of a comprehensive MRSA colonization surveillance program. It is not intended to diagnose MRSA infection nor to guide or monitor treatment for MRSA infections. Test performance is not FDA approved in patients less than 51 years old. Performed at [facility], [address], [place], [place] [address]     Labs: Results for orders placed or performed during the hospital encounter of 12/16/23 (from the past 48 hours)  Comprehensive metabolic panel     Status: Abnormal   Collection Time: 12/16/23  3:24 PM  Result Value Ref Range   Sodium 129 (L) 135 - 145 mmol/L   Potassium 2.8 (L) 3.5 - 5.1 mmol/L   Chloride 96 (L) 98 - 111 mmol/L   CO2  16 (L) 22 - 32 mmol/L   Glucose, Bld 118 (H) 70 - 99 mg/dL    Comment: Glucose reference range applies only to samples taken after fasting for at least 8 hours.   BUN 55 (H) 6 - 20 mg/dL   Creatinine, Ser 6.07 (H) 0.61 - 1.24 mg/dL   Calcium  6.3 (LL) 8.9 - 10.3 mg/dL    Comment: CRITICAL RESULT CALLED TO, READ BACK BY AND VERIFIED WITH V. VALDEZ RN @1633  ON 12/16/23 MAB   Total Protein 5.2 (L) 6.5 - 8.1 g/dL   Albumin  1.6 (L) 3.5 - 5.0 g/dL   AST 68 (H) 15 - 41 U/L   ALT 37 0 - 44 U/L   Alkaline Phosphatase 69 38 - 126 U/L   Total Bilirubin 3.4 (H) 0.0 - 1.2 mg/dL   GFR, Estimated 17 (L) >60 mL/min  Comment: (NOTE) Calculated using the CKD-EPI Creatinine Equation (2021)    Anion gap 17 (H) 5 - 15    Comment: Performed at [facility], [address] [place], [place] [address]  CBC     Status: Abnormal   Collection Time: 12/16/23  3:24 PM  Result Value Ref Range   WBC 6.0 4.0 - 10.5 K/uL   RBC 4.65 4.22 - 5.81 MIL/uL   Hemoglobin 12.6 (L) 13.0 - 17.0 g/dL   HCT 62.8 (L) 60.9 - 47.9 %   MCV 79.8 (L) 80.0 - 100.0 fL   MCH 27.1 26.0 - 34.0 pg   MCHC 34.0 30.0 - 36.0 g/dL   RDW 86.2 88.4 - 84.4 %   Platelets 324 150 - 400 K/uL   nRBC 5.7 (H) 0.0 - 0.2 %    Comment: Performed at [facility], [address], [place], [place] [address]  APTT     Status: None   Collection Time: 12/16/23  3:24 PM  Result Value Ref Range   aPTT 29 24 - 36 seconds    Comment: Performed at [facility], [address], [place], [place] [address]  Protime-INR     Status: Abnormal   Collection Time: 12/16/23  3:24 PM  Result Value Ref Range   Prothrombin Time 16.3 (H) 11.4 - 15.2 seconds   INR 1.2 0.8 - 1.2    Comment: (NOTE) INR goal varies based on device and disease states. Performed at [facility], [address], [place], [place] [address]   Magnesium      Status: None   Collection Time: 12/16/23  3:24 PM  Result Value Ref Range   Magnesium  2.3 1.7 - 2.4 mg/dL     Comment: Performed at [facility], [address], [place], [place] [address]  Phosphorus     Status: Abnormal   Collection Time: 12/16/23  3:24 PM  Result Value Ref Range   Phosphorus 7.3 (H) 2.5 - 4.6 mg/dL    Comment: Performed at [facility], [address] [place], [place] [address]  Troponin I (High Sensitivity)     Status: Abnormal   Collection Time: 12/16/23  3:24 PM  Result Value Ref Range   Troponin I (High Sensitivity) 82 (H) <18 ng/L    Comment: (NOTE) Elevated high sensitivity troponin I (hsTnI) values and significant  changes across serial measurements may suggest ACS but many other  chronic and acute conditions are known to elevate hsTnI results.  Refer to the Links section for chest pain algorithms and additional  guidance. Performed at [facility], [address], [place], [place] [address]   Triglycerides     Status: None   Collection Time: 12/16/23  3:26 PM  Result Value Ref Range   Triglycerides 145 <150 mg/dL    Comment: Performed at [facility], [address] [place], [place] [address]  I-stat chem 8, ED (not at [facility], [facility] or [facility])     Status: Abnormal   Collection Time: 12/16/23  3:35 PM  Result Value Ref Range   Sodium 130 (L) 135 - 145 mmol/L   Potassium 2.8 (L) 3.5 - 5.1 mmol/L   Chloride 96 (L) 98 - 111 mmol/L   BUN 50 (H) 6 - 20 mg/dL   Creatinine, Ser 5.89 (H) 0.61 - 1.24 mg/dL   Glucose, Bld 885 (H) 70 - 99 mg/dL    Comment: Glucose reference range applies  only to samples taken after fasting for at least 8 hours.   Calcium , Ion 0.79 (LL) 1.15 - 1.40 mmol/L   TCO2 17 (L) 22 - 32 mmol/L   Hemoglobin 12.9 (L) 13.0 - 17.0 g/dL   HCT 61.9 (L) 60.9 - 47.9 %   Comment NOTIFIED PHYSICIAN   I-Stat Lactic Acid, ED     Status: Abnormal   Collection Time: 12/16/23  3:38 PM  Result Value Ref Range   Lactic Acid, Venous 3.3 (HH) 0.5 - 1.9 mmol/L   Comment NOTIFIED PHYSICIAN   I-Stat CG4 Lactic Acid     Status: Abnormal    Collection Time: 12/16/23  4:00 PM  Result Value Ref Range   Lactic Acid, Venous 3.5 (HH) 0.5 - 1.9 mmol/L   Comment NOTIFIED PHYSICIAN   I-Stat arterial blood gas, ED     Status: Abnormal   Collection Time: 12/16/23  5:10 PM  Result Value Ref Range   pH, Arterial 7.219 (L) 7.35 - 7.45   pCO2 arterial 54.3 (H) 32 - 48 mmHg   pO2, Arterial 74 (L) 83 - 108 mmHg   Bicarbonate 22.3 20.0 - 28.0 mmol/L   TCO2 24 22 - 32 mmol/L   O2 Saturation 92 %   Acid-base deficit 6.0 (H) 0.0 - 2.0 mmol/L   Sodium 123 (L) 135 - 145 mmol/L   Potassium 3.4 (L) 3.5 - 5.1 mmol/L   Calcium , Ion 0.97 (L) 1.15 - 1.40 mmol/L   HCT 37.0 (L) 39.0 - 52.0 %   Hemoglobin 12.6 (L) 13.0 - 17.0 g/dL   Patient temperature 02.0 F    Sample type ARTERIAL   CBG monitoring, ED     Status: Abnormal   Collection Time: 12/16/23  5:20 PM  Result Value Ref Range   Glucose-Capillary 137 (H) 70 - 99 mg/dL    Comment: Glucose reference range applies only to samples taken after fasting for at least 8 hours.  Glucose, capillary     Status: Abnormal   Collection Time: 12/16/23  6:24 PM  Result Value Ref Range   Glucose-Capillary 157 (H) 70 - 99 mg/dL    Comment: Glucose reference range applies only to samples taken after fasting for at least 8 hours.  MRSA Next Gen by PCR, Nasal     Status: None   Collection Time: 12/16/23  6:39 PM   Specimen: Nasal Mucosa; Nasal Swab  Result Value Ref Range   MRSA by PCR Next Gen NOT DETECTED NOT DETECTED    Comment: (NOTE) The GeneXpert MRSA Assay (FDA approved for NASAL specimens only), is one component of a comprehensive MRSA colonization surveillance program. It is not intended to diagnose MRSA infection nor to guide or monitor treatment for MRSA infections. Test performance is not FDA approved in patients less than 33 years old. Performed at [facility], [address] [place], [place] [address]   HIV Antibody (routine testing w rflx)     Status: None   Collection Time:  12/16/23  6:39 PM  Result Value Ref Range   HIV Screen 4th Generation wRfx Non Reactive Non Reactive    Comment: Performed at [facility], [address], [place], [place] [address]  Lactic acid, plasma     Status: None   Collection Time: 12/16/23  6:39 PM  Result Value Ref Range   Lactic Acid, Venous 1.2 0.5 - 1.9 mmol/L    Comment: Performed at [facility], [address] [place], [place]  [address]  Troponin I (High Sensitivity)     Status: Abnormal   Collection Time: 12/16/23  6:39 PM  Result Value Ref Range   Troponin I (High Sensitivity) 95 (H) <18 ng/L    Comment: (NOTE) Elevated high sensitivity troponin I (hsTnI) values and significant  changes across serial measurements may suggest ACS but many other  chronic and acute conditions are known to elevate hsTnI results.  Refer to the Links section for chest pain algorithms and additional  guidance. Performed at [facility], [address] [place], [place] [address]   Glucose, capillary     Status: Abnormal   Collection Time: 12/16/23  7:52 PM  Result Value Ref Range   Glucose-Capillary 136 (H) 70 - 99 mg/dL    Comment: Glucose reference range applies only to samples taken after fasting for at least 8 hours.  Basic metabolic panel     Status: Abnormal   Collection Time: 12/16/23  8:23 PM  Result Value Ref Range   Sodium 124 (L) 135 - 145 mmol/L   Potassium 3.7 3.5 - 5.1 mmol/L   Chloride 89 (L) 98 - 111 mmol/L   CO2 19 (L) 22 - 32 mmol/L   Glucose, Bld 137 (H) 70 - 99 mg/dL    Comment: Glucose reference range applies only to samples taken after fasting for at least 8 hours.   BUN 66 (H) 6 - 20 mg/dL   Creatinine, Ser 5.54 (H) 0.61 - 1.24 mg/dL   Calcium  6.9 (L) 8.9 - 10.3 mg/dL   GFR, Estimated 14 (L) >60 mL/min    Comment: (NOTE) Calculated using the CKD-EPI Creatinine Equation (2021)    Anion gap 16 (H) 5 - 15    Comment: Performed at [facility], [address], [place], [place]  [address]  Lactic acid, plasma     Status: None   Collection Time: 12/16/23  8:23 PM  Result Value Ref Range   Lactic Acid, Venous 1.3 0.5 - 1.9 mmol/L    Comment: Performed at [facility], [address] [place], [place] [address]  I-STAT 7, (LYTES, BLD GAS, ICA, H+H)     Status: Abnormal   Collection Time: 12/16/23  9:24 PM  Result Value Ref Range   pH, Arterial 7.301 (L) 7.35 - 7.45   pCO2 arterial 41.3 32 - 48 mmHg   pO2, Arterial 102 83 - 108 mmHg   Bicarbonate 20.4 20.0 - 28.0 mmol/L   TCO2 22 22 - 32 mmol/L   O2 Saturation 97 %   Acid-base deficit 6.0 (H) 0.0 - 2.0 mmol/L   Sodium 121 (L) 135 - 145 mmol/L   Potassium 4.0 3.5 - 5.1 mmol/L   Calcium , Ion 0.95 (L) 1.15 - 1.40 mmol/L   HCT 35.0 (L) 39.0 - 52.0 %   Hemoglobin 11.9 (L) 13.0 - 17.0 g/dL   Patient temperature 63.3 C    Sample type ARTERIAL   Glucose, capillary     Status: Abnormal   Collection Time: 12/16/23 11:08 PM  Result Value Ref Range   Glucose-Capillary 133 (H) 70 - 99 mg/dL    Comment: Glucose reference range applies only to samples taken after fasting for at least 8 hours.  Glucose, capillary     Status: Abnormal   Collection Time: 12/17/23  3:20 AM  Result Value Ref Range   Glucose-Capillary 123 (H) 70 - 99 mg/dL    Comment: Glucose reference range applies only to samples taken after fasting  for at least 8 hours.  Basic metabolic panel     Status: Abnormal   Collection Time: 12/17/23  3:33 AM  Result Value Ref Range   Sodium 125 (L) 135 - 145 mmol/L   Potassium 4.3 3.5 - 5.1 mmol/L   Chloride 87 (L) 98 - 111 mmol/L   CO2 17 (L) 22 - 32 mmol/L   Glucose, Bld 121 (H) 70 - 99 mg/dL    Comment: Glucose reference range applies only to samples taken after fasting for at least 8 hours.   BUN 74 (H) 6 - 20 mg/dL   Creatinine, Ser 4.86 (H) 0.61 - 1.24 mg/dL   Calcium  7.0 (L) 8.9 - 10.3 mg/dL   GFR, Estimated 12 (L) >60 mL/min    Comment: (NOTE) Calculated using the CKD-EPI Creatinine Equation  (2021)    Anion gap 21 (H) 5 - 15    Comment: ELECTROLYTES REPEATED TO VERIFY Performed at [facility], [address] [place], [place] [address]   CBC     Status: Abnormal   Collection Time: 12/17/23  3:33 AM  Result Value Ref Range   WBC 4.8 4.0 - 10.5 K/uL   RBC 4.50 4.22 - 5.81 MIL/uL   Hemoglobin 12.1 (L) 13.0 - 17.0 g/dL   HCT 64.5 (L) 60.9 - 47.9 %   MCV 78.7 (L) 80.0 - 100.0 fL   MCH 26.9 26.0 - 34.0 pg   MCHC 34.2 30.0 - 36.0 g/dL   RDW 85.8 88.4 - 84.4 %   Platelets 303 150 - 400 K/uL   nRBC 1.9 (H) 0.0 - 0.2 %    Comment: Performed at [facility], [address], [place], [place] [address]  Hepatic function panel     Status: Abnormal   Collection Time: 12/17/23  3:33 AM  Result Value Ref Range   Total Protein 5.6 (L) 6.5 - 8.1 g/dL   Albumin  1.5 (L) 3.5 - 5.0 g/dL   AST 83 (H) 15 - 41 U/L   ALT 43 0 - 44 U/L   Alkaline Phosphatase 85 38 - 126 U/L   Total Bilirubin 3.9 (H) 0.0 - 1.2 mg/dL   Bilirubin, Direct 2.6 (H) 0.0 - 0.2 mg/dL   Indirect Bilirubin 1.3 (H) 0.3 - 0.9 mg/dL    Comment: Performed at [facility], [address] [place], [place] [address]  Magnesium      Status: Abnormal   Collection Time: 12/17/23  3:33 AM  Result Value Ref Range   Magnesium  2.9 (H) 1.7 - 2.4 mg/dL    Comment: Performed at [facility], [address], [place], [place] [address]  Phosphorus     Status: Abnormal   Collection Time: 12/17/23  3:33 AM  Result Value Ref Range   Phosphorus 11.7 (H) 2.5 - 4.6 mg/dL    Comment: Performed at [facility], [address] [place], [place] [address]  Triglycerides     Status: Abnormal   Collection Time: 12/17/23  3:34 AM  Result Value Ref Range   Triglycerides 271 (H) <150 mg/dL    Comment: Performed at [facility], [address], [place], [place] [address]  I-STAT 7, (LYTES, BLD GAS, ICA, H+H)     Status: Abnormal   Collection Time: 12/17/23  4:22 AM  Result Value Ref Range   pH, Arterial  7.292 (L) 7.35 - 7.45   pCO2 arterial 42.5 32 - 48 mmHg   pO2, Arterial 95  83 - 108 mmHg   Bicarbonate 20.6 20.0 - 28.0 mmol/L   TCO2 22 22 - 32 mmol/L   O2 Saturation 97 %   Acid-base deficit 6.0 (H) 0.0 - 2.0 mmol/L   Sodium 123 (L) 135 - 145 mmol/L   Potassium 4.5 3.5 - 5.1 mmol/L   Calcium , Ion 0.87 (LL) 1.15 - 1.40 mmol/L   HCT 38.0 (L) 39.0 - 52.0 %   Hemoglobin 12.9 (L) 13.0 - 17.0 g/dL   Patient temperature 63.2 C    Sample type ARTERIAL   Glucose, capillary     Status: Abnormal   Collection Time: 12/17/23  7:07 AM  Result Value Ref Range   Glucose-Capillary 106 (H) 70 - 99 mg/dL    Comment: Glucose reference range applies only to samples taken after fasting for at least 8 hours.    Imaging / Studies: DG Chest Port 1 View Result Date: 12/17/2023 EXAM: 1 VIEW(S) XRAY OF THE CHEST 12/17/2023 05:48:00 AM COMPARISON: 12/16/2023 CLINICAL HISTORY: Respiratory failure; Hx of: chest pain, coronary artery disease; Chief complaint: Loss of consciousness FINDINGS: LINES, TUBES AND DEVICES: Right IJ central venous catheter in place with tip over the SVC. ETT in place with tip 3.7 cm above the carina. Enteric tube in place, courses below the diaphragm with tip obscured. Overlying defibrillator pads. LUNGS AND PLEURA: Low lung volumes. Mild right basilar atelectasis. Similar left retrocardiac airspace opacity, likely atelectasis. No pulmonary edema. No pleural effusion. No pneumothorax. HEART AND MEDIASTINUM: No acute abnormality of the cardiac and mediastinal silhouettes. BONES AND SOFT TISSUES: Median sternotomy and CABG changes noted. IMPRESSION: 1. Low lung volumes with mild basilar atelectasis/retrocardiac atelectasis. 2. Support devices appropriately positioned, including right IJ central venous catheter with tip in the SVC, endotracheal tube 3.7 cm above the carina, and enteric tube coursing below the diaphragm. Electronically signed by: Waddell Calk MD 12/17/2023 06:09 AM EDT RP  Workstation: [id number]   CT CYSTOGRAM ABD/PELVIS Result Date: 12/16/2023 CLINICAL DATA:  Sepsis with concern for bladder perforation. EXAM: CT CYSTOGRAM (CT ABDOMEN AND PELVIS WITH CONTRAST) TECHNIQUE: Multi-detector CT imaging through the abdomen and pelvis was performed after dilute contrast had been introduced into the bladder for the purposes of performing CT cystography. RADIATION DOSE REDUCTION: This exam was performed according to the departmental dose-optimization program which includes automated exposure control, adjustment of the mA and/or kV according to patient size and/or use of iterative reconstruction technique. CONTRAST:  50mL OMNIPAQUE  IOHEXOL  300 MG/ML  SOLN COMPARISON:  12/16/2023 FINDINGS: Lower chest: Atelectasis or consolidation in both lung bases. Hepatobiliary: Cholelithiasis.  No bile duct dilatation. Pancreas: Unremarkable. No pancreatic ductal dilatation or surrounding inflammatory changes. Spleen: Normal in size without focal abnormality. Adrenals/Urinary Tract: Left adrenal gland nodules. See previous description from prior CT earlier today. Kidneys are symmetrical. No hydronephrosis or hydroureter. Contrast material fills the bladder. Foley catheter in the bladder. No bladder wall thickening. Contrast material is demonstrated leaking along the catheter through the urethra to the distal penile region. There is focal irregularity at the junction of the urethra with the bladder base with small anterior contrast pooling suggesting contained leak. No contrast extravasation to suggest intraperitoneal leakage. Remainder the urethra appears otherwise intact. Stomach/Bowel: Enteric tube tip in the stomach. Dilated fluid-filled small bowel demonstrates some decompression since previous study. Colon is decompressed. Persistent wall thickening in the terminal ileum and colon. Vascular/Lymphatic: Aortic atherosclerosis. No enlarged abdominal or pelvic lymph nodes. Reproductive: Prostate gland is  surgically absent. No significant pelvic lymphadenopathy. Other: Multiple loculated  fluid collections demonstrated in the right abdomen and pelvis as previously discussed. Small amount of free fluid. Diffuse stranding throughout the intra-abdominal fat. Changes are consistent with multiple abscesses. Musculoskeletal: No acute bony abnormalities. No focal bone lesions. IMPRESSION: 1. CT cystogram with Foley catheter in place. Contrast material leaks along the catheter through the urethra. There is focal irregularity at the junction of the bladder base and urethra with small anterior contrast pooling suggesting contained perforation. No intraperitoneal contrast extravasation. 2. Appearance examination is otherwise unchanged. Again demonstrated are multiple loculated fluid collections in the abdomen and pelvis consistent with abscesses. Wall thickening of the cecum and terminal ileum. Fluid-filled dilated small bowel appear mildly improved. Electronically Signed   By: Elsie Gravely M.D.   On: 12/16/2023 22:41   CT Angio Chest Pulmonary Embolism (PE) W or WO Contrast Result Date: 12/16/2023 CLINICAL DATA:  Aortic aneurysm suspected. Severe right-sided abdominal pain and abdominal distention. Decreased oxygen saturation. EXAM: CT ANGIOGRAPHY CHEST CT ABDOMEN AND PELVIS WITH CONTRAST TECHNIQUE: Multidetector CT imaging of the chest was performed using the standard protocol during bolus administration of intravenous contrast. Multiplanar CT image reconstructions and MIPs were obtained to evaluate the vascular anatomy. Multidetector CT imaging of the abdomen and pelvis was performed using the standard protocol during bolus administration of intravenous contrast. RADIATION DOSE REDUCTION: This exam was performed according to the departmental dose-optimization program which includes automated exposure control, adjustment of the mA and/or kV according to patient size and/or use of iterative reconstruction technique.  CONTRAST:  75mL OMNIPAQUE  IOHEXOL  350 MG/ML SOLN COMPARISON:  Chest radiograph 12/16/2023.  PET-CT 06/23/2023 FINDINGS: CTA CHEST FINDINGS Cardiovascular: Cardiac enlargement. No pericardial effusions. Normal caliber thoracic aorta. No aortic dissection. Calcification of the aorta and coronary arteries. Great vessel origins are patent. Postoperative changes consistent with coronary bypass. No evidence of significant pulmonary embolus. Mediastinum/Nodes: Enteric tube with tip in the stomach. Esophagus is decompressed. Endotracheal tube with tip above the carina. Thyroid gland is unremarkable. Scattered mediastinal lymph nodes are not pathologically enlarged, likely reactive. Lungs/Pleura: Consolidation or atelectasis in both lung bases, possibly pneumonia, compressive atelectasis, or aspiration. Small bilateral pleural effusions. No pneumothorax. Musculoskeletal: Degenerative changes in the spine. Sternotomy wires. No acute bony abnormalities. Review of the MIP images confirms the above findings. CT ABDOMEN and PELVIS FINDINGS Hepatobiliary: No focal liver lesions. Cholelithiasis with several stones in the gallbladder. No wall thickening or inflammatory stranding. No bile duct dilatation. Pancreas: Unremarkable. No pancreatic ductal dilatation or surrounding inflammatory changes. Spleen: Normal in size without focal abnormality. Adrenals/Urinary Tract: Left adrenal gland nodule measuring 2.1 cm diameter. Sixty-five Hounsfield unit density measurement. The lesions are unchanged since prior unenhanced study from 12/18/2020 at which time the density measurement was -3 Hounsfield units. This is consistent with a benign fat containing adenoma and no imaging follow-up is indicated. Kidneys are symmetrical and homogeneous. No hydronephrosis or hydroureter. Bladder is decompressed. Stomach/Bowel: Stomach is unremarkable. Colon is decompressed. Wall thickening of the ascending colon and hepatic flexure, new since prior study.  This may indicate colitis or inflammatory bowel disease versus reactive change. Dilated fluid-filled small bowel with air-fluid levels and mild diffuse wall thickening. Terminal ileum is decompressed. This is consistent with small-bowel obstruction, possibly due to inflammatory strictures. Vascular/Lymphatic: Aortic atherosclerosis. No enlarged abdominal or pelvic lymph nodes. Reproductive: Prostate is unremarkable. Other: Diffuse infiltration throughout the right-sided abdominal and pelvic mesenteric fat. Multiple loculated fluid collections are demonstrated throughout the right abdomen and pelvis. A right pelvic collection measures 7.4 cm diameter. A  right anterior lower quadrant collection measures 8.8 cm diameter. A right anterior para psoas collection measures 9.4 cm diameter. Multiple smaller right upper quadrant collections are demonstrated. Some collections contains small gas bubbles. This appearance is likely indicate multiple abscesses, possibly due to perforation. Could also consider a perforated appendicitis. Small amount of free fluid in the abdomen, likely ascites or reactive fluid. No free intra-abdominal gas. Musculoskeletal: Degenerative changes in the spine. No acute bony abnormalities. Review of the MIP images confirms the above findings. IMPRESSION: 1. Fluid-filled distended small bowel with decompressed terminal ileum consistent with small-bowel obstruction. 2. Irregular wall thickening diffusely throughout the distal small bowel and right colon suggesting enterocolitis or inflammatory bowel disease. Inflammatory strictures may be present. 3. Multiple loculated fluid collections, some with gas, demonstrated in the right abdomen and pelvis indicating abscesses. This is likely due to perforation, possibly related to inflammatory bowel disease or to an occult perforated appendicitis. The appendix is self is not demonstrated. No free intra-abdominal air. 4. Small amount of free fluid in the abdomen  is likely reactive. 5. Cholelithiasis without evidence of acute cholecystitis. 6. Aortic atherosclerosis. 7. Consolidation versus compressive atelectasis in both lung bases. Small bilateral pleural effusions. Critical Value/emergent results were called by telephone at the time of interpretation on 12/16/2023 at 5:28 pm to provider MATTHEW TRIFAN , who verbally acknowledged these results. Electronically Signed   By: Elsie Gravely M.D.   On: 12/16/2023 17:33   CT ABDOMEN PELVIS W CONTRAST Result Date: 12/16/2023 CLINICAL DATA:  Aortic aneurysm suspected. Severe right-sided abdominal pain and abdominal distention. Decreased oxygen saturation. EXAM: CT ANGIOGRAPHY CHEST CT ABDOMEN AND PELVIS WITH CONTRAST TECHNIQUE: Multidetector CT imaging of the chest was performed using the standard protocol during bolus administration of intravenous contrast. Multiplanar CT image reconstructions and MIPs were obtained to evaluate the vascular anatomy. Multidetector CT imaging of the abdomen and pelvis was performed using the standard protocol during bolus administration of intravenous contrast. RADIATION DOSE REDUCTION: This exam was performed according to the departmental dose-optimization program which includes automated exposure control, adjustment of the mA and/or kV according to patient size and/or use of iterative reconstruction technique. CONTRAST:  75mL OMNIPAQUE  IOHEXOL  350 MG/ML SOLN COMPARISON:  Chest radiograph 12/16/2023.  PET-CT 06/23/2023 FINDINGS: CTA CHEST FINDINGS Cardiovascular: Cardiac enlargement. No pericardial effusions. Normal caliber thoracic aorta. No aortic dissection. Calcification of the aorta and coronary arteries. Great vessel origins are patent. Postoperative changes consistent with coronary bypass. No evidence of significant pulmonary embolus. Mediastinum/Nodes: Enteric tube with tip in the stomach. Esophagus is decompressed. Endotracheal tube with tip above the carina. Thyroid gland is  unremarkable. Scattered mediastinal lymph nodes are not pathologically enlarged, likely reactive. Lungs/Pleura: Consolidation or atelectasis in both lung bases, possibly pneumonia, compressive atelectasis, or aspiration. Small bilateral pleural effusions. No pneumothorax. Musculoskeletal: Degenerative changes in the spine. Sternotomy wires. No acute bony abnormalities. Review of the MIP images confirms the above findings. CT ABDOMEN and PELVIS FINDINGS Hepatobiliary: No focal liver lesions. Cholelithiasis with several stones in the gallbladder. No wall thickening or inflammatory stranding. No bile duct dilatation. Pancreas: Unremarkable. No pancreatic ductal dilatation or surrounding inflammatory changes. Spleen: Normal in size without focal abnormality. Adrenals/Urinary Tract: Left adrenal gland nodule measuring 2.1 cm diameter. Sixty-five Hounsfield unit density measurement. The lesions are unchanged since prior unenhanced study from 12/18/2020 at which time the density measurement was -3 Hounsfield units. This is consistent with a benign fat containing adenoma and no imaging follow-up is indicated. Kidneys are symmetrical and homogeneous. No  hydronephrosis or hydroureter. Bladder is decompressed. Stomach/Bowel: Stomach is unremarkable. Colon is decompressed. Wall thickening of the ascending colon and hepatic flexure, new since prior study. This may indicate colitis or inflammatory bowel disease versus reactive change. Dilated fluid-filled small bowel with air-fluid levels and mild diffuse wall thickening. Terminal ileum is decompressed. This is consistent with small-bowel obstruction, possibly due to inflammatory strictures. Vascular/Lymphatic: Aortic atherosclerosis. No enlarged abdominal or pelvic lymph nodes. Reproductive: Prostate is unremarkable. Other: Diffuse infiltration throughout the right-sided abdominal and pelvic mesenteric fat. Multiple loculated fluid collections are demonstrated throughout the  right abdomen and pelvis. A right pelvic collection measures 7.4 cm diameter. A right anterior lower quadrant collection measures 8.8 cm diameter. A right anterior para psoas collection measures 9.4 cm diameter. Multiple smaller right upper quadrant collections are demonstrated. Some collections contains small gas bubbles. This appearance is likely indicate multiple abscesses, possibly due to perforation. Could also consider a perforated appendicitis. Small amount of free fluid in the abdomen, likely ascites or reactive fluid. No free intra-abdominal gas. Musculoskeletal: Degenerative changes in the spine. No acute bony abnormalities. Review of the MIP images confirms the above findings. IMPRESSION: 1. Fluid-filled distended small bowel with decompressed terminal ileum consistent with small-bowel obstruction. 2. Irregular wall thickening diffusely throughout the distal small bowel and right colon suggesting enterocolitis or inflammatory bowel disease. Inflammatory strictures may be present. 3. Multiple loculated fluid collections, some with gas, demonstrated in the right abdomen and pelvis indicating abscesses. This is likely due to perforation, possibly related to inflammatory bowel disease or to an occult perforated appendicitis. The appendix is self is not demonstrated. No free intra-abdominal air. 4. Small amount of free fluid in the abdomen is likely reactive. 5. Cholelithiasis without evidence of acute cholecystitis. 6. Aortic atherosclerosis. 7. Consolidation versus compressive atelectasis in both lung bases. Small bilateral pleural effusions. Critical Value/emergent results were called by telephone at the time of interpretation on 12/16/2023 at 5:28 pm to provider MATTHEW TRIFAN , who verbally acknowledged these results. Electronically Signed   By: Elsie Gravely M.D.   On: 12/16/2023 17:33   CT HEAD WO CONTRAST Result Date: 12/16/2023 CLINICAL DATA:  Altered mental status. EXAM: CT HEAD WITHOUT CONTRAST  TECHNIQUE: Contiguous axial images were obtained from the base of the skull through the vertex without intravenous contrast. RADIATION DOSE REDUCTION: This exam was performed according to the departmental dose-optimization program which includes automated exposure control, adjustment of the mA and/or kV according to patient size and/or use of iterative reconstruction technique. COMPARISON:  May 10, 2022 FINDINGS: Brain: No evidence of acute infarction, hemorrhage, hydrocephalus, extra-axial collection or mass lesion/mass effect. There are areas of mildly decreased attenuation within the white matter tracts of the supratentorial brain, consistent with microvascular disease changes. Vascular: No hyperdense vessel or unexpected calcification. Skull: Normal. Negative for fracture or focal lesion. Sinuses/Orbits: No acute finding. Other: None. IMPRESSION: No acute intracranial pathology. Electronically Signed   By: Suzen Dials M.D.   On: 12/16/2023 17:17   DG Chest Port 1 View Result Date: 12/16/2023 CLINICAL DATA:  [id number] Encounter for central line placement [id number] EXAM: PORTABLE CHEST - 1 VIEW COMPARISON:  12/16/2023, 07/16/2023 FINDINGS: Similarly positioned endotracheal and esophagogastric tubes. Interval placement of a right IJ approach central venous catheter, which terminates at the cavoatrial junction. Improved aeration of the lungs. Retrocardiac airspace opacities. No pleural effusion or pneumothorax. No cardiomegaly. Sternotomy wires and CABG markers. Defibrillator pads. IMPRESSION: 1. Interval placement of a right IJ approach central venous catheter, which terminates  at the cavoatrial junction. No pneumothorax. 2. Improved aeration of the lungs with retrocardiac airspace opacities, possibly atelectasis or aspiration. Electronically Signed   By: Rogelia Myers M.D.   On: 12/16/2023 16:28   DG Chest Port 1 View Result Date: 12/16/2023 CLINICAL DATA:  Central line and ET tube placement EXAM:  PORTABLE CHEST - 1 VIEW COMPARISON:  July 16, 2023 FINDINGS: Endotracheal tube terminates in the mid trachea. Esophagogastric tube terminates in the region of the stomach. Overlying defibrillator pads. Markedly low lung volumes. No focal airspace consolidation, pleural effusion, or pneumothorax. No cardiomegaly. IMPRESSION: 1. Well-positioned endotracheal and esophagogastric tubes. No pneumothorax. 2. Markedly low lung volumes.  No pneumonia or pleural effusion. Electronically Signed   By: Rogelia Myers M.D.   On: 12/16/2023 16:26    Medications / Allergies: per chart  Antibiotics: Anti-infectives (From admission, onward)    Start     Dose/Rate Route Frequency Ordered Stop   12/17/23 1000  linezolid  (ZYVOX ) IVPB 600 mg        600 mg 300 mL/hr over 60 Minutes Intravenous Every 12 hours 12/16/23 1924     12/17/23 0000  piperacillin -tazobactam (ZOSYN ) IVPB 3.375 g        3.375 g 12.5 mL/hr over 240 Minutes Intravenous Every 8 hours 12/16/23 1924     12/16/23 1600  piperacillin -tazobactam (ZOSYN ) IVPB 3.375 g        3.375 g 12.5 mL/hr over 240 Minutes Intravenous  Once 12/16/23 1551 12/16/23 1602   12/16/23 1600  vancomycin  (VANCOREADY) IVPB 1750 mg/350 mL        1,750 mg 175 mL/hr over 120 Minutes Intravenous  Once 12/16/23 1551 12/16/23 1816         Note: Portions of this report may have been transcribed using voice recognition software. Every effort was made to ensure accuracy; however, inadvertent computerized transcription errors may be present.   Any transcriptional errors that result from this process are unintentional.    Elspeth KYM Schultze, MD, FACS, MASCRS Esophageal, Gastrointestinal & Colorectal Surgery Robotic and Minimally Invasive Surgery  [facility] [address] [place], [place] [address] [phone] Fax [phone] Main  CONTACT INFORMATION: Weekday (9AM-5PM): Call [facility] main office at  [phone] Weeknight (5PM-9AM) or Weekend/Holiday: Check EPIC Web Links tab & use AMION (password  [email or web address]) for General Surgery [facility] coverage  Please, DO NOT use SecureChat  (it is not reliable communication to reach operating surgeons & will lead to a delay in care).   Epic staff messaging available for outptient concerns needing 1-2 business day response.      12/17/2023  7:50 AM

## 2023-12-17 NOTE — Progress Notes (Signed)
 eLink Physician-Brief Progress Note Patient Name: Glenn Stewart DOB: [birth year] MRN: [id number]   Date of Service  12/17/2023  HPI/Events of Note  Patient on CRRT.  Albumin  <1.5 and calcium  still low at 7.2  Been giving calcium  without much help. Should we try giving abumin?   eICU Interventions  Discussed with RN. Follow ion calcium  for hypocalcemia and replace as needed.  No need for albumin  infusion, need to improve nutritional status.      Intervention Category Intermediate Interventions: Other:;Electrolyte abnormality - evaluation and management  Jodelle ONEIDA Hutching 12/17/2023, 8:35 PM

## 2023-12-17 NOTE — Progress Notes (Signed)
 Subjective: Glenn Stewart remains intubated and anuric with AKI.  The CT cystogram showed only a minor contained anastomotic leak.  He is for IR drainage of the intrabdominal fluid collections today.   ROS:  Review of Systems  Unable to perform ROS: Intubated    Anti-infectives: Anti-infectives (From admission, onward)    Start     Dose/Rate Route Frequency Ordered Stop   12/17/23 1000  linezolid  (ZYVOX ) IVPB 600 mg        600 mg 300 mL/hr over 60 Minutes Intravenous Every 12 hours 12/16/23 1924     12/17/23 0000  piperacillin -tazobactam (ZOSYN ) IVPB 3.375 g        3.375 g 12.5 mL/hr over 240 Minutes Intravenous Every 8 hours 12/16/23 1924     12/16/23 1600  piperacillin -tazobactam (ZOSYN ) IVPB 3.375 g        3.375 g 12.5 mL/hr over 240 Minutes Intravenous  Once 12/16/23 1551 12/16/23 1602   12/16/23 1600  vancomycin  (VANCOREADY) IVPB 1750 mg/350 mL        1,750 mg 175 mL/hr over 120 Minutes Intravenous  Once 12/16/23 1551 12/16/23 1816       Current Facility-Administered Medications  Medication Dose Route Frequency Provider Last Rate Last Admin   0.9 %  sodium chloride  infusion   Intra-arterial PRN Simpson, Paula B, NP       amiodarone  (NEXTERONE ) 1.8 mg/mL load via infusion 150 mg  150 mg Intravenous Once Trifan, Matthew J, MD       Followed by   amiodarone  (NEXTERONE  PREMIX) 360-4.14 MG/200ML-% (1.8 mg/mL) IV infusion  30 mg/hr Intravenous Continuous Cottie Donnice PARAS, MD 16.67 mL/hr at 12/17/23 0800 30 mg/hr at 12/17/23 0800   bisacodyl  (DULCOLAX) suppository 10 mg  10 mg Rectal Daily Sheldon Standing, MD   10 mg at 12/17/23 0854   Chlorhexidine  Gluconate Cloth 2 % PADS 6 each  6 each Topical Daily Mohammed, Shahid, MD       docusate (COLACE) 50 MG/5ML liquid 100 mg  100 mg Per Tube BID Simpson, Paula B, NP   100 mg at 12/16/23 2051   fentaNYL  (SUBLIMAZE ) bolus via infusion 25-100 mcg  25-100 mcg Intravenous Q15 min PRN Cottie Donnice PARAS, MD   100 mcg at 12/17/23 0854   fentaNYL   (SUBLIMAZE ) injection 25-50 mcg  25-50 mcg Intravenous Once Trifan, Matthew J, MD       fentaNYL  in NS (69mcg/ml) infusion-PREMIX  0-400 mcg/hr Intravenous Continuous Cottie Donnice PARAS, MD 12.5 mL/hr at 12/17/23 0800 125 mcg/hr at 12/17/23 0800   lactated ringers  bolus 1,000 mL  1,000 mL Intravenous Once Ramaswamy, Murali, MD       linezolid  (ZYVOX ) IVPB 600 mg  600 mg Intravenous Q12H Mohammed, Shahid, MD       norepinephrine  (LEVOPHED ) 16 mg in 250mL (0.064 mg/mL) premix infusion  0-40 mcg/min Intravenous Titrated Gatha Pence, MD   Stopped at 12/17/23 0751   Oral care mouth rinse  15 mL Mouth Rinse Q2H Mohammed, Shahid, MD   15 mL at 12/17/23 0747   Oral care mouth rinse  15 mL Mouth Rinse PRN Gatha Pence, MD       pantoprazole  (PROTONIX ) injection 40 mg  40 mg Intravenous Daily Antonetta Moccasin B, NP   40 mg at 12/16/23 2019   piperacillin -tazobactam (ZOSYN ) IVPB 3.375 g  3.375 g Intravenous Q8H Mohammed, Shahid, MD 12.5 mL/hr at 12/17/23 0800 Infusion Verify at 12/17/23 0800   polyethylene glycol (MIRALAX  / GLYCOLAX ) packet 17 g  17  g Per Tube Daily Antonetta Moccasin B, NP   17 g at 12/16/23 2050   propofol  (DIPRIVAN ) 1000 MG/100ML infusion  0-80 mcg/kg/min Intravenous Continuous Antonetta Moccasin B, NP 12.46 mL/hr at 12/17/23 0800 20 mcg/kg/min at 12/17/23 0800     Objective: Vital signs in last 24 hours: Temp:  [95.9 F (35.5 C)-98.7 F (37.1 C)] 98.6 F (37 C) (09/27 0845) Pulse Rate:  [54-154] 99 (09/27 0845) Resp:  [18-46] 28 (09/27 0845) BP: (84-128)/(42-91) 102/68 (09/27 0800) SpO2:  [86 %-100 %] 98 % (09/27 0845) Arterial Line BP: (93-137)/(55-72) 103/63 (09/27 0845) FiO2 (%):  [80 %-100 %] 80 % (09/27 0725) Weight:  [103.8 kg] 103.8 kg (09/27 0414)  Intake/Output from previous day: 09/26 0701 - 09/27 0700 In: 4193.7 [I.V.:2237.7; NG/GT:120; IV Piggyback:1836] Out: 2350 [Urine:100; Emesis/NG output:250; Stool:2000] Intake/Output this shift: Total I/O In:  45.3 [I.V.:43.9; IV Piggyback:1.4] Out: -    Physical Exam Vitals reviewed.  Constitutional:      Comments: Intubated and sedated.      Lab Results:  Recent Labs    12/16/23 1524 12/16/23 1535 12/17/23 0333 12/17/23 0422  WBC 6.0  --  4.8  --   HGB 12.6*   < > 12.1* 12.9*  HCT 37.1*   < > 35.4* 38.0*  PLT 324  --  303  --    < > = values in this interval not displayed.   BMET Recent Labs    12/16/23 2023 12/16/23 2124 12/17/23 0333 12/17/23 0422  NA 124*   < > 125* 123*  K 3.7   < > 4.3 4.5  CL 89*  --  87*  --   CO2 19*  --  17*  --   GLUCOSE 137*  --  121*  --   BUN 66*  --  74*  --   CREATININE 4.45*  --  5.13*  --   CALCIUM  6.9*  --  7.0*  --    < > = values in this interval not displayed.   PT/INR Recent Labs    12/16/23 1524  LABPROT 16.3*  INR 1.2   ABG Recent Labs    12/16/23 2124 12/17/23 0422  PHART 7.301* 7.292*  HCO3 20.4 20.6    Studies/Results: DG Chest Port 1 View Result Date: 12/17/2023 EXAM: 1 VIEW(S) XRAY OF THE CHEST 12/17/2023 05:48:00 AM COMPARISON: 12/16/2023 CLINICAL HISTORY: Respiratory failure; Hx of: chest pain, coronary artery disease; Chief complaint: Loss of consciousness FINDINGS: LINES, TUBES AND DEVICES: Right IJ central venous catheter in place with tip over the SVC. ETT in place with tip 3.7 cm above the carina. Enteric tube in place, courses below the diaphragm with tip obscured. Overlying defibrillator pads. LUNGS AND PLEURA: Low lung volumes. Mild right basilar atelectasis. Similar left retrocardiac airspace opacity, likely atelectasis. No pulmonary edema. No pleural effusion. No pneumothorax. HEART AND MEDIASTINUM: No acute abnormality of the cardiac and mediastinal silhouettes. BONES AND SOFT TISSUES: Median sternotomy and CABG changes noted. IMPRESSION: 1. Low lung volumes with mild basilar atelectasis/retrocardiac atelectasis. 2. Support devices appropriately positioned, including right IJ central venous catheter with  tip in the SVC, endotracheal tube 3.7 cm above the carina, and enteric tube coursing below the diaphragm. Electronically signed by: Waddell Calk MD 12/17/2023 06:09 AM EDT RP Workstation: [id number]   CT CYSTOGRAM ABD/PELVIS Result Date: 12/16/2023 CLINICAL DATA:  Sepsis with concern for bladder perforation. EXAM: CT CYSTOGRAM (CT ABDOMEN AND PELVIS WITH CONTRAST) TECHNIQUE: Multi-detector CT imaging through the abdomen and pelvis  was performed after dilute contrast had been introduced into the bladder for the purposes of performing CT cystography. RADIATION DOSE REDUCTION: This exam was performed according to the departmental dose-optimization program which includes automated exposure control, adjustment of the mA and/or kV according to patient size and/or use of iterative reconstruction technique. CONTRAST:  50mL OMNIPAQUE  IOHEXOL  300 MG/ML  SOLN COMPARISON:  12/16/2023 FINDINGS: Lower chest: Atelectasis or consolidation in both lung bases. Hepatobiliary: Cholelithiasis.  No bile duct dilatation. Pancreas: Unremarkable. No pancreatic ductal dilatation or surrounding inflammatory changes. Spleen: Normal in size without focal abnormality. Adrenals/Urinary Tract: Left adrenal gland nodules. See previous description from prior CT earlier today. Kidneys are symmetrical. No hydronephrosis or hydroureter. Contrast material fills the bladder. Foley catheter in the bladder. No bladder wall thickening. Contrast material is demonstrated leaking along the catheter through the urethra to the distal penile region. There is focal irregularity at the junction of the urethra with the bladder base with small anterior contrast pooling suggesting contained leak. No contrast extravasation to suggest intraperitoneal leakage. Remainder the urethra appears otherwise intact. Stomach/Bowel: Enteric tube tip in the stomach. Dilated fluid-filled small bowel demonstrates some decompression since previous study. Colon is decompressed.  Persistent wall thickening in the terminal ileum and colon. Vascular/Lymphatic: Aortic atherosclerosis. No enlarged abdominal or pelvic lymph nodes. Reproductive: Prostate gland is surgically absent. No significant pelvic lymphadenopathy. Other: Multiple loculated fluid collections demonstrated in the right abdomen and pelvis as previously discussed. Small amount of free fluid. Diffuse stranding throughout the intra-abdominal fat. Changes are consistent with multiple abscesses. Musculoskeletal: No acute bony abnormalities. No focal bone lesions. IMPRESSION: 1. CT cystogram with Foley catheter in place. Contrast material leaks along the catheter through the urethra. There is focal irregularity at the junction of the bladder base and urethra with small anterior contrast pooling suggesting contained perforation. No intraperitoneal contrast extravasation. 2. Appearance examination is otherwise unchanged. Again demonstrated are multiple loculated fluid collections in the abdomen and pelvis consistent with abscesses. Wall thickening of the cecum and terminal ileum. Fluid-filled dilated small bowel appear mildly improved. Electronically Signed   By: Elsie Gravely M.D.   On: 12/16/2023 22:41   CT Angio Chest Pulmonary Embolism (PE) W or WO Contrast Result Date: 12/16/2023 CLINICAL DATA:  Aortic aneurysm suspected. Severe right-sided abdominal pain and abdominal distention. Decreased oxygen saturation. EXAM: CT ANGIOGRAPHY CHEST CT ABDOMEN AND PELVIS WITH CONTRAST TECHNIQUE: Multidetector CT imaging of the chest was performed using the standard protocol during bolus administration of intravenous contrast. Multiplanar CT image reconstructions and MIPs were obtained to evaluate the vascular anatomy. Multidetector CT imaging of the abdomen and pelvis was performed using the standard protocol during bolus administration of intravenous contrast. RADIATION DOSE REDUCTION: This exam was performed according to the departmental  dose-optimization program which includes automated exposure control, adjustment of the mA and/or kV according to patient size and/or use of iterative reconstruction technique. CONTRAST:  75mL OMNIPAQUE  IOHEXOL  350 MG/ML SOLN COMPARISON:  Chest radiograph 12/16/2023.  PET-CT 06/23/2023 FINDINGS: CTA CHEST FINDINGS Cardiovascular: Cardiac enlargement. No pericardial effusions. Normal caliber thoracic aorta. No aortic dissection. Calcification of the aorta and coronary arteries. Great vessel origins are patent. Postoperative changes consistent with coronary bypass. No evidence of significant pulmonary embolus. Mediastinum/Nodes: Enteric tube with tip in the stomach. Esophagus is decompressed. Endotracheal tube with tip above the carina. Thyroid gland is unremarkable. Scattered mediastinal lymph nodes are not pathologically enlarged, likely reactive. Lungs/Pleura: Consolidation or atelectasis in both lung bases, possibly pneumonia, compressive atelectasis, or aspiration. Small  bilateral pleural effusions. No pneumothorax. Musculoskeletal: Degenerative changes in the spine. Sternotomy wires. No acute bony abnormalities. Review of the MIP images confirms the above findings. CT ABDOMEN and PELVIS FINDINGS Hepatobiliary: No focal liver lesions. Cholelithiasis with several stones in the gallbladder. No wall thickening or inflammatory stranding. No bile duct dilatation. Pancreas: Unremarkable. No pancreatic ductal dilatation or surrounding inflammatory changes. Spleen: Normal in size without focal abnormality. Adrenals/Urinary Tract: Left adrenal gland nodule measuring 2.1 cm diameter. Sixty-five Hounsfield unit density measurement. The lesions are unchanged since prior unenhanced study from 12/18/2020 at which time the density measurement was -3 Hounsfield units. This is consistent with a benign fat containing adenoma and no imaging follow-up is indicated. Kidneys are symmetrical and homogeneous. No hydronephrosis or  hydroureter. Bladder is decompressed. Stomach/Bowel: Stomach is unremarkable. Colon is decompressed. Wall thickening of the ascending colon and hepatic flexure, new since prior study. This may indicate colitis or inflammatory bowel disease versus reactive change. Dilated fluid-filled small bowel with air-fluid levels and mild diffuse wall thickening. Terminal ileum is decompressed. This is consistent with small-bowel obstruction, possibly due to inflammatory strictures. Vascular/Lymphatic: Aortic atherosclerosis. No enlarged abdominal or pelvic lymph nodes. Reproductive: Prostate is unremarkable. Other: Diffuse infiltration throughout the right-sided abdominal and pelvic mesenteric fat. Multiple loculated fluid collections are demonstrated throughout the right abdomen and pelvis. A right pelvic collection measures 7.4 cm diameter. A right anterior lower quadrant collection measures 8.8 cm diameter. A right anterior para psoas collection measures 9.4 cm diameter. Multiple smaller right upper quadrant collections are demonstrated. Some collections contains small gas bubbles. This appearance is likely indicate multiple abscesses, possibly due to perforation. Could also consider a perforated appendicitis. Small amount of free fluid in the abdomen, likely ascites or reactive fluid. No free intra-abdominal gas. Musculoskeletal: Degenerative changes in the spine. No acute bony abnormalities. Review of the MIP images confirms the above findings. IMPRESSION: 1. Fluid-filled distended small bowel with decompressed terminal ileum consistent with small-bowel obstruction. 2. Irregular wall thickening diffusely throughout the distal small bowel and right colon suggesting enterocolitis or inflammatory bowel disease. Inflammatory strictures may be present. 3. Multiple loculated fluid collections, some with gas, demonstrated in the right abdomen and pelvis indicating abscesses. This is likely due to perforation, possibly related to  inflammatory bowel disease or to an occult perforated appendicitis. The appendix is self is not demonstrated. No free intra-abdominal air. 4. Small amount of free fluid in the abdomen is likely reactive. 5. Cholelithiasis without evidence of acute cholecystitis. 6. Aortic atherosclerosis. 7. Consolidation versus compressive atelectasis in both lung bases. Small bilateral pleural effusions. Critical Value/emergent results were called by telephone at the time of interpretation on 12/16/2023 at 5:28 pm to provider MATTHEW TRIFAN , who verbally acknowledged these results. Electronically Signed   By: Elsie Gravely M.D.   On: 12/16/2023 17:33   CT ABDOMEN PELVIS W CONTRAST Result Date: 12/16/2023 CLINICAL DATA:  Aortic aneurysm suspected. Severe right-sided abdominal pain and abdominal distention. Decreased oxygen saturation. EXAM: CT ANGIOGRAPHY CHEST CT ABDOMEN AND PELVIS WITH CONTRAST TECHNIQUE: Multidetector CT imaging of the chest was performed using the standard protocol during bolus administration of intravenous contrast. Multiplanar CT image reconstructions and MIPs were obtained to evaluate the vascular anatomy. Multidetector CT imaging of the abdomen and pelvis was performed using the standard protocol during bolus administration of intravenous contrast. RADIATION DOSE REDUCTION: This exam was performed according to the departmental dose-optimization program which includes automated exposure control, adjustment of the mA and/or kV according to patient size  and/or use of iterative reconstruction technique. CONTRAST:  75mL OMNIPAQUE  IOHEXOL  350 MG/ML SOLN COMPARISON:  Chest radiograph 12/16/2023.  PET-CT 06/23/2023 FINDINGS: CTA CHEST FINDINGS Cardiovascular: Cardiac enlargement. No pericardial effusions. Normal caliber thoracic aorta. No aortic dissection. Calcification of the aorta and coronary arteries. Great vessel origins are patent. Postoperative changes consistent with coronary bypass. No evidence of  significant pulmonary embolus. Mediastinum/Nodes: Enteric tube with tip in the stomach. Esophagus is decompressed. Endotracheal tube with tip above the carina. Thyroid gland is unremarkable. Scattered mediastinal lymph nodes are not pathologically enlarged, likely reactive. Lungs/Pleura: Consolidation or atelectasis in both lung bases, possibly pneumonia, compressive atelectasis, or aspiration. Small bilateral pleural effusions. No pneumothorax. Musculoskeletal: Degenerative changes in the spine. Sternotomy wires. No acute bony abnormalities. Review of the MIP images confirms the above findings. CT ABDOMEN and PELVIS FINDINGS Hepatobiliary: No focal liver lesions. Cholelithiasis with several stones in the gallbladder. No wall thickening or inflammatory stranding. No bile duct dilatation. Pancreas: Unremarkable. No pancreatic ductal dilatation or surrounding inflammatory changes. Spleen: Normal in size without focal abnormality. Adrenals/Urinary Tract: Left adrenal gland nodule measuring 2.1 cm diameter. Sixty-five Hounsfield unit density measurement. The lesions are unchanged since prior unenhanced study from 12/18/2020 at which time the density measurement was -3 Hounsfield units. This is consistent with a benign fat containing adenoma and no imaging follow-up is indicated. Kidneys are symmetrical and homogeneous. No hydronephrosis or hydroureter. Bladder is decompressed. Stomach/Bowel: Stomach is unremarkable. Colon is decompressed. Wall thickening of the ascending colon and hepatic flexure, new since prior study. This may indicate colitis or inflammatory bowel disease versus reactive change. Dilated fluid-filled small bowel with air-fluid levels and mild diffuse wall thickening. Terminal ileum is decompressed. This is consistent with small-bowel obstruction, possibly due to inflammatory strictures. Vascular/Lymphatic: Aortic atherosclerosis. No enlarged abdominal or pelvic lymph nodes. Reproductive: Prostate is  unremarkable. Other: Diffuse infiltration throughout the right-sided abdominal and pelvic mesenteric fat. Multiple loculated fluid collections are demonstrated throughout the right abdomen and pelvis. A right pelvic collection measures 7.4 cm diameter. A right anterior lower quadrant collection measures 8.8 cm diameter. A right anterior para psoas collection measures 9.4 cm diameter. Multiple smaller right upper quadrant collections are demonstrated. Some collections contains small gas bubbles. This appearance is likely indicate multiple abscesses, possibly due to perforation. Could also consider a perforated appendicitis. Small amount of free fluid in the abdomen, likely ascites or reactive fluid. No free intra-abdominal gas. Musculoskeletal: Degenerative changes in the spine. No acute bony abnormalities. Review of the MIP images confirms the above findings. IMPRESSION: 1. Fluid-filled distended small bowel with decompressed terminal ileum consistent with small-bowel obstruction. 2. Irregular wall thickening diffusely throughout the distal small bowel and right colon suggesting enterocolitis or inflammatory bowel disease. Inflammatory strictures may be present. 3. Multiple loculated fluid collections, some with gas, demonstrated in the right abdomen and pelvis indicating abscesses. This is likely due to perforation, possibly related to inflammatory bowel disease or to an occult perforated appendicitis. The appendix is self is not demonstrated. No free intra-abdominal air. 4. Small amount of free fluid in the abdomen is likely reactive. 5. Cholelithiasis without evidence of acute cholecystitis. 6. Aortic atherosclerosis. 7. Consolidation versus compressive atelectasis in both lung bases. Small bilateral pleural effusions. Critical Value/emergent results were called by telephone at the time of interpretation on 12/16/2023 at 5:28 pm to provider MATTHEW TRIFAN , who verbally acknowledged these results. Electronically  Signed   By: Elsie Gravely M.D.   On: 12/16/2023 17:33   CT HEAD WO  CONTRAST Result Date: 12/16/2023 CLINICAL DATA:  Altered mental status. EXAM: CT HEAD WITHOUT CONTRAST TECHNIQUE: Contiguous axial images were obtained from the base of the skull through the vertex without intravenous contrast. RADIATION DOSE REDUCTION: This exam was performed according to the departmental dose-optimization program which includes automated exposure control, adjustment of the mA and/or kV according to patient size and/or use of iterative reconstruction technique. COMPARISON:  May 10, 2022 FINDINGS: Brain: No evidence of acute infarction, hemorrhage, hydrocephalus, extra-axial collection or mass lesion/mass effect. There are areas of mildly decreased attenuation within the white matter tracts of the supratentorial brain, consistent with microvascular disease changes. Vascular: No hyperdense vessel or unexpected calcification. Skull: Normal. Negative for fracture or focal lesion. Sinuses/Orbits: No acute finding. Other: None. IMPRESSION: No acute intracranial pathology. Electronically Signed   By: Suzen Dials M.D.   On: 12/16/2023 17:17   DG Chest Port 1 View Result Date: 12/16/2023 CLINICAL DATA:  [id number] Encounter for central line placement [id number] EXAM: PORTABLE CHEST - 1 VIEW COMPARISON:  12/16/2023, 07/16/2023 FINDINGS: Similarly positioned endotracheal and esophagogastric tubes. Interval placement of a right IJ approach central venous catheter, which terminates at the cavoatrial junction. Improved aeration of the lungs. Retrocardiac airspace opacities. No pleural effusion or pneumothorax. No cardiomegaly. Sternotomy wires and CABG markers. Defibrillator pads. IMPRESSION: 1. Interval placement of a right IJ approach central venous catheter, which terminates at the cavoatrial junction. No pneumothorax. 2. Improved aeration of the lungs with retrocardiac airspace opacities, possibly atelectasis or aspiration.  Electronically Signed   By: Rogelia Myers M.D.   On: 12/16/2023 16:28   DG Chest Port 1 View Result Date: 12/16/2023 CLINICAL DATA:  Central line and ET tube placement EXAM: PORTABLE CHEST - 1 VIEW COMPARISON:  July 16, 2023 FINDINGS: Endotracheal tube terminates in the mid trachea. Esophagogastric tube terminates in the region of the stomach. Overlying defibrillator pads. Markedly low lung volumes. No focal airspace consolidation, pleural effusion, or pneumothorax. No cardiomegaly. IMPRESSION: 1. Well-positioned endotracheal and esophagogastric tubes. No pneumothorax. 2. Markedly low lung volumes.  No pneumonia or pleural effusion. Electronically Signed   By: Rogelia Myers M.D.   On: 12/16/2023 16:26     Assessment and Plan: SBO with intraabdominal fluid collection/abscess.  The CT cystogram shows only a small contained leak at the anterior anastamosis.  The fluid collection could be a urinoma with subsequent sealing of the leak, a large lymphocele or from a bowl injury.    Agree with IR drainage of the fluid collection.  Should send the fluid for a Creatinine level.    2.   Prostate cancer s/p robotic prostatectomy.  Small contained   anastomotic leak.  Leave foley for now.       LOS: 1 day    Norleen Seltzer 12/17/2023

## 2023-12-17 NOTE — Procedures (Signed)
 Central Venous Catheter Insertion Procedure Note  Glenn Stewart  [id number]  February 28, 1966  Date:12/17/23  Time:4:24 PM   Provider Performing:Jerryl Holzhauer D. Harris   Procedure: Insertion of Non-tunneled Central Venous [phone]) with US  guidance ([id number])    Indication(s) Hemodialysis  Consent Risks of the procedure as well as the alternatives and risks of each were explained to the patient and/or caregiver.  Consent for the procedure was obtained and is signed in the bedside chart  Anesthesia Topical only with 1% lidocaine    Timeout Verified patient identification, verified procedure, site/side was marked, verified correct patient position, special equipment/implants available, medications/allergies/relevant history reviewed, required imaging and test results available.  Sterile Technique Maximal sterile technique including full sterile barrier drape, hand hygiene, sterile gown, sterile gloves, mask, hair covering, sterile ultrasound probe cover (if used).  Procedure Description Area of catheter insertion was cleaned with chlorhexidine  and draped in sterile fashion.  With real-time ultrasound guidance a HD catheter was placed into the left internal jugular vein. Nonpulsatile blood flow and easy flushing noted in all ports.  The catheter was sutured in place and sterile dressing applied.  Complications/Tolerance None; patient tolerated the procedure well. Chest X-ray is ordered to verify placement for internal jugular or subclavian cannulation.   Chest x-ray is not ordered for femoral cannulation.  EBL Minimal  Specimen(s) None  Piero Mustard D. Harris, NP-C [facility] Personal contact information can be found on Amion  If no contact or response made please call [phone] 12/17/2023, 4:27 PM

## 2023-12-17 NOTE — Consult Note (Signed)
 Chief Complaint: Altered mental status, abdominal pain, intra-abdominal abscess s/p recent radial prostatectomy - IR consulted for abdominal drain placement  Referring Provider(s): Watt Rush, MD   Supervising Physician: Hughes Simmonds  Patient Status: [facility] - In-pt  History of Present Illness: Glenn Stewart is a 58 y.o. male with pmhx of HTN, CAD, CABG x 5 04/2022 with post op Afib since stopped on Eliquis  and amiodarone  09/2022, HTN, HLD, GERD, NASH, bipolar/ ADHD, and recent robotic radical prostatectomy on 11/17/23. Presented to ED for weakness and altered mental status with abd pain. Found to be hemodynamically unstable with episodes of afib with RVR and VT, requiring emergent cardioversion. Post cardioversion, found to be in respiratory distress and pt was intubated and placed on ventilator. CT imaging shows intra-abdominal abscess, concerning for infected urinoma in setting of recent prostatectomy. IR consulted for abdominal drain placement. Case and imaging reviewed with IR attending Dr. Hughes, who is agreeable to proceed with right abd drain placement.  Pt remains intubated and on ventilator, further history limited.   Patient is Full Code  Past Medical History:  Diagnosis Date   ADHD (attention deficit hyperactivity disorder)    Anxiety state, unspecified    Bipolar 1 disorder (HCC)    Cancer (HCC)    CHEST PAIN 12/04/2008   Qualifier: Diagnosis of  By: [name] MD, [name]    Coronary artery disease    Depressive disorder, not elsewhere classified    Dermatophytosis of nail    Esophageal reflux    History of kidney stones    HYPERGLYCEMIA 07/10/2007   Qualifier: Diagnosis of  By: [name] MD, [name]    Low back pain 09/18/2013   Other chronic nonalcoholic liver disease    Pre-diabetes    Problems with hearing    Prostatitis, unspecified    Pure hypercholesterolemia    Screen for STD (sexually transmitted disease) 04/20/2011   Sprain of cruciate ligament of knee     Tear of medial cartilage or meniscus of knee, current    TMJ (temporomandibular joint disorder) 12/25/2010   Unspecified essential hypertension    Unspecified gastritis and gastroduodenitis without mention of hemorrhage    Urethritis, unspecified     Past Surgical History:  Procedure Laterality Date   CORONARY ARTERY BYPASS GRAFT N/A 05/12/2022   Procedure: CORONARY ARTERY BYPASS GRAFTING (CABG) X FIVE BYPASSES USING OPEN LEFT INTERNAL MAMMARY ARTERY, OPEN LEFT RADIAL ARTERY, AND ENDOSCOPIC RIGHT GREATER SAPHENOUS VEIN HARVEST.;  Surgeon: [name], MD;  Location: [facility] OR;  Service: Open Heart Surgery;  Laterality: N/A;   CYSTOSCOPY W/ LITHOLAPAXY / EHL     LEFT HEART CATH AND CORONARY ANGIOGRAPHY N/A 05/10/2022   Procedure: LEFT HEART CATH AND CORONARY ANGIOGRAPHY;  Surgeon: [name], MD;  Location: [facility] INVASIVE CV LAB;  Service: Cardiovascular;  Laterality: N/A;   No prior surgery     RADIAL ARTERY HARVEST Left 05/12/2022   Procedure: RADIAL ARTERY HARVEST;  Surgeon: [name], MD;  Location: [facility] OR;  Service: Open Heart Surgery;  Laterality: Left;   ROBOT ASSISTED LAPAROSCOPIC RADICAL PROSTATECTOMY N/A 11/17/2023   Procedure: PROSTATECTOMY, RADICAL, ROBOT-ASSISTED, LAPAROSCOPIC;  Surgeon: [name], MD;  Location: [facility] ORS;  Service: Urology;  Laterality: N/A;  ROBOTIC RADICAL PROSTATECTOMY AND BILATERAL PELVIC LYMPH NODE DISSECTION   TEE WITHOUT CARDIOVERSION N/A 05/12/2022   Procedure: TRANSESOPHAGEAL ECHOCARDIOGRAM;  Surgeon: [name], MD;  Location: [facility] OR;  Service: Open Heart Surgery;  Laterality: N/A;    Allergies:  Sulfonamide derivatives  Medications: Prior to Admission medications   Medication Sig Start Date End Date Taking? Authorizing Provider  acetaminophen  (TYLENOL ) 325 MG tablet Take 2 tablets (650 mg total) by mouth every 4 (four) hours as needed for moderate pain or headache. 05/19/22   Zimmerman, Donielle M, PA-C   ALPRAZolam  (XANAX ) 1 MG tablet Take 1 mg by mouth Once daily as needed for anxiety. 04/17/11   [provider]  amLODipine  (NORVASC ) 5 MG tablet Take 1 tablet (5 mg total) by mouth daily. 12/07/23   Pietro Redell RAMAN, MD  atorvastatin  (LIPITOR) 80 MG tablet Take 1 tablet (80 mg total) by mouth daily. 12/07/23   Pietro Redell RAMAN, MD  ciprofloxacin  (CIPRO ) 500 MG tablet Take 1 tablet (500 mg total) by mouth 2 (two) times daily. Start day prior to follow up appt for catheter removal 11/17/23   Cory Palma, PA-C  docusate sodium  (COLACE) 100 MG capsule Take 1 capsule (100 mg total) by mouth 2 (two) times daily. 11/17/23   Cory Palma, PA-C  isosorbide  mononitrate (IMDUR ) 30 MG 24 hr tablet Take 1 tablet (30 mg total) by mouth daily. 12/07/23   Pietro Redell RAMAN, MD  metoprolol  tartrate (LOPRESSOR ) 25 MG tablet TAKE ONE TABLET BY MOUTH TWICE DAILY 11/23/23   Goodrich, Callie E, PA-C  pantoprazole  (PROTONIX ) 20 MG tablet Take 1 tablet (20 mg total) by mouth daily. 12/07/23 01/06/24  Pietro Redell RAMAN, MD  sertraline  (ZOLOFT ) 100 MG tablet Take 100 mg by mouth daily.    [provider]  traMADol  (ULTRAM ) 50 MG tablet Take 1-2 tablets (50-100 mg total) by mouth every 6 (six) hours as needed for moderate pain (pain score 4-6) or severe pain (pain score 7-10). 11/17/23   Cory Palma, PA-C     Family History  Problem Relation Age of Onset   Diabetes Father    Hypertension Father    Heart failure Father     Social History   Socioeconomic History   Marital status: Single    Spouse name: Not on file   Number of children: 0   Years of education: Not on file   Highest education level: Not on file  Occupational History   Occupation: Owns motorcycle shop  Tobacco Use   Smoking status: Never   Smokeless tobacco: Never  Vaping Use   Vaping status: Never Used  Substance and Sexual Activity   Alcohol use: Yes    Comment: rare   Drug use: No   Sexual activity: Not on file  Other  Topics Concern   Not on file  Social History Narrative   Not on file   Social Drivers of Health   Financial Resource Strain: Not on file  Food Insecurity: No Food Insecurity (11/17/2023)   Hunger Vital Sign    Worried About Running Out of Food in the Last Year: Never true    Ran Out of Food in the Last Year: Never true  Transportation Needs: No Transportation Needs (11/17/2023)   PRAPARE - Transportation    Lack of Transportation (Medical): No    Lack of Transportation (Non-Medical): No  Physical Activity: Not on file  Stress: Not on file  Social Connections: Not on file    Review of Systems  Unable to perform ROS: Acuity of condition    Vital Signs: BP 102/68   Pulse 99   Temp 98.6 F (37 C)   Resp (!) 28   Ht 5' 5 (1.651 m)   Wt 228 lb 13.4  oz (103.8 kg)   SpO2 96%   BMI 38.08 kg/m   Advance Care Plan: No documents on file  Physical Exam Vitals and nursing note reviewed.  Constitutional:      Appearance: He is ill-appearing.     Comments: Intubated on ventilator, sedated  HENT:     Mouth/Throat:     Comments: Intubated Cardiovascular:     Rate and Rhythm: Tachycardia present.  Pulmonary:     Comments: On ventilator. Mildly course breath sounds bil Abdominal:     Comments: Mildly distended  Musculoskeletal:     Right lower leg: No edema.     Left lower leg: No edema.  Skin:    General: Skin is warm and dry.  Neurological:     Comments: Intubated on vent, unable to assess     Imaging: DG Chest Port 1 View Result Date: 12/17/2023 EXAM: 1 VIEW(S) XRAY OF THE CHEST 12/17/2023 05:48:00 AM COMPARISON: 12/16/2023 CLINICAL HISTORY: Respiratory failure; Hx of: chest pain, coronary artery disease; Chief complaint: Loss of consciousness FINDINGS: LINES, TUBES AND DEVICES: Right IJ central venous catheter in place with tip over the SVC. ETT in place with tip 3.7 cm above the carina. Enteric tube in place, courses below the diaphragm with tip obscured. Overlying  defibrillator pads. LUNGS AND PLEURA: Low lung volumes. Mild right basilar atelectasis. Similar left retrocardiac airspace opacity, likely atelectasis. No pulmonary edema. No pleural effusion. No pneumothorax. HEART AND MEDIASTINUM: No acute abnormality of the cardiac and mediastinal silhouettes. BONES AND SOFT TISSUES: Median sternotomy and CABG changes noted. IMPRESSION: 1. Low lung volumes with mild basilar atelectasis/retrocardiac atelectasis. 2. Support devices appropriately positioned, including right IJ central venous catheter with tip in the SVC, endotracheal tube 3.7 cm above the carina, and enteric tube coursing below the diaphragm. Electronically signed by: Waddell Calk MD 12/17/2023 06:09 AM EDT RP Workstation: [id number]   CT CYSTOGRAM ABD/PELVIS Result Date: 12/16/2023 CLINICAL DATA:  Sepsis with concern for bladder perforation. EXAM: CT CYSTOGRAM (CT ABDOMEN AND PELVIS WITH CONTRAST) TECHNIQUE: Multi-detector CT imaging through the abdomen and pelvis was performed after dilute contrast had been introduced into the bladder for the purposes of performing CT cystography. RADIATION DOSE REDUCTION: This exam was performed according to the departmental dose-optimization program which includes automated exposure control, adjustment of the mA and/or kV according to patient size and/or use of iterative reconstruction technique. CONTRAST:  50mL OMNIPAQUE  IOHEXOL  300 MG/ML  SOLN COMPARISON:  12/16/2023 FINDINGS: Lower chest: Atelectasis or consolidation in both lung bases. Hepatobiliary: Cholelithiasis.  No bile duct dilatation. Pancreas: Unremarkable. No pancreatic ductal dilatation or surrounding inflammatory changes. Spleen: Normal in size without focal abnormality. Adrenals/Urinary Tract: Left adrenal gland nodules. See previous description from prior CT earlier today. Kidneys are symmetrical. No hydronephrosis or hydroureter. Contrast material fills the bladder. Foley catheter in the bladder. No bladder  wall thickening. Contrast material is demonstrated leaking along the catheter through the urethra to the distal penile region. There is focal irregularity at the junction of the urethra with the bladder base with small anterior contrast pooling suggesting contained leak. No contrast extravasation to suggest intraperitoneal leakage. Remainder the urethra appears otherwise intact. Stomach/Bowel: Enteric tube tip in the stomach. Dilated fluid-filled small bowel demonstrates some decompression since previous study. Colon is decompressed. Persistent wall thickening in the terminal ileum and colon. Vascular/Lymphatic: Aortic atherosclerosis. No enlarged abdominal or pelvic lymph nodes. Reproductive: Prostate gland is surgically absent. No significant pelvic lymphadenopathy. Other: Multiple loculated fluid collections demonstrated in the right abdomen  and pelvis as previously discussed. Small amount of free fluid. Diffuse stranding throughout the intra-abdominal fat. Changes are consistent with multiple abscesses. Musculoskeletal: No acute bony abnormalities. No focal bone lesions. IMPRESSION: 1. CT cystogram with Foley catheter in place. Contrast material leaks along the catheter through the urethra. There is focal irregularity at the junction of the bladder base and urethra with small anterior contrast pooling suggesting contained perforation. No intraperitoneal contrast extravasation. 2. Appearance examination is otherwise unchanged. Again demonstrated are multiple loculated fluid collections in the abdomen and pelvis consistent with abscesses. Wall thickening of the cecum and terminal ileum. Fluid-filled dilated small bowel appear mildly improved. Electronically Signed   By: Elsie Gravely M.D.   On: 12/16/2023 22:41   CT Angio Chest Pulmonary Embolism (PE) W or WO Contrast Result Date: 12/16/2023 CLINICAL DATA:  Aortic aneurysm suspected. Severe right-sided abdominal pain and abdominal distention. Decreased  oxygen saturation. EXAM: CT ANGIOGRAPHY CHEST CT ABDOMEN AND PELVIS WITH CONTRAST TECHNIQUE: Multidetector CT imaging of the chest was performed using the standard protocol during bolus administration of intravenous contrast. Multiplanar CT image reconstructions and MIPs were obtained to evaluate the vascular anatomy. Multidetector CT imaging of the abdomen and pelvis was performed using the standard protocol during bolus administration of intravenous contrast. RADIATION DOSE REDUCTION: This exam was performed according to the departmental dose-optimization program which includes automated exposure control, adjustment of the mA and/or kV according to patient size and/or use of iterative reconstruction technique. CONTRAST:  75mL OMNIPAQUE  IOHEXOL  350 MG/ML SOLN COMPARISON:  Chest radiograph 12/16/2023.  PET-CT 06/23/2023 FINDINGS: CTA CHEST FINDINGS Cardiovascular: Cardiac enlargement. No pericardial effusions. Normal caliber thoracic aorta. No aortic dissection. Calcification of the aorta and coronary arteries. Great vessel origins are patent. Postoperative changes consistent with coronary bypass. No evidence of significant pulmonary embolus. Mediastinum/Nodes: Enteric tube with tip in the stomach. Esophagus is decompressed. Endotracheal tube with tip above the carina. Thyroid gland is unremarkable. Scattered mediastinal lymph nodes are not pathologically enlarged, likely reactive. Lungs/Pleura: Consolidation or atelectasis in both lung bases, possibly pneumonia, compressive atelectasis, or aspiration. Small bilateral pleural effusions. No pneumothorax. Musculoskeletal: Degenerative changes in the spine. Sternotomy wires. No acute bony abnormalities. Review of the MIP images confirms the above findings. CT ABDOMEN and PELVIS FINDINGS Hepatobiliary: No focal liver lesions. Cholelithiasis with several stones in the gallbladder. No wall thickening or inflammatory stranding. No bile duct dilatation. Pancreas:  Unremarkable. No pancreatic ductal dilatation or surrounding inflammatory changes. Spleen: Normal in size without focal abnormality. Adrenals/Urinary Tract: Left adrenal gland nodule measuring 2.1 cm diameter. Sixty-five Hounsfield unit density measurement. The lesions are unchanged since prior unenhanced study from 12/18/2020 at which time the density measurement was -3 Hounsfield units. This is consistent with a benign fat containing adenoma and no imaging follow-up is indicated. Kidneys are symmetrical and homogeneous. No hydronephrosis or hydroureter. Bladder is decompressed. Stomach/Bowel: Stomach is unremarkable. Colon is decompressed. Wall thickening of the ascending colon and hepatic flexure, new since prior study. This may indicate colitis or inflammatory bowel disease versus reactive change. Dilated fluid-filled small bowel with air-fluid levels and mild diffuse wall thickening. Terminal ileum is decompressed. This is consistent with small-bowel obstruction, possibly due to inflammatory strictures. Vascular/Lymphatic: Aortic atherosclerosis. No enlarged abdominal or pelvic lymph nodes. Reproductive: Prostate is unremarkable. Other: Diffuse infiltration throughout the right-sided abdominal and pelvic mesenteric fat. Multiple loculated fluid collections are demonstrated throughout the right abdomen and pelvis. A right pelvic collection measures 7.4 cm diameter. A right anterior lower quadrant collection measures 8.8  cm diameter. A right anterior para psoas collection measures 9.4 cm diameter. Multiple smaller right upper quadrant collections are demonstrated. Some collections contains small gas bubbles. This appearance is likely indicate multiple abscesses, possibly due to perforation. Could also consider a perforated appendicitis. Small amount of free fluid in the abdomen, likely ascites or reactive fluid. No free intra-abdominal gas. Musculoskeletal: Degenerative changes in the spine. No acute bony  abnormalities. Review of the MIP images confirms the above findings. IMPRESSION: 1. Fluid-filled distended small bowel with decompressed terminal ileum consistent with small-bowel obstruction. 2. Irregular wall thickening diffusely throughout the distal small bowel and right colon suggesting enterocolitis or inflammatory bowel disease. Inflammatory strictures may be present. 3. Multiple loculated fluid collections, some with gas, demonstrated in the right abdomen and pelvis indicating abscesses. This is likely due to perforation, possibly related to inflammatory bowel disease or to an occult perforated appendicitis. The appendix is self is not demonstrated. No free intra-abdominal air. 4. Small amount of free fluid in the abdomen is likely reactive. 5. Cholelithiasis without evidence of acute cholecystitis. 6. Aortic atherosclerosis. 7. Consolidation versus compressive atelectasis in both lung bases. Small bilateral pleural effusions. Critical Value/emergent results were called by telephone at the time of interpretation on 12/16/2023 at 5:28 pm to provider MATTHEW TRIFAN , who verbally acknowledged these results. Electronically Signed   By: Elsie Gravely M.D.   On: 12/16/2023 17:33   CT ABDOMEN PELVIS W CONTRAST Result Date: 12/16/2023 CLINICAL DATA:  Aortic aneurysm suspected. Severe right-sided abdominal pain and abdominal distention. Decreased oxygen saturation. EXAM: CT ANGIOGRAPHY CHEST CT ABDOMEN AND PELVIS WITH CONTRAST TECHNIQUE: Multidetector CT imaging of the chest was performed using the standard protocol during bolus administration of intravenous contrast. Multiplanar CT image reconstructions and MIPs were obtained to evaluate the vascular anatomy. Multidetector CT imaging of the abdomen and pelvis was performed using the standard protocol during bolus administration of intravenous contrast. RADIATION DOSE REDUCTION: This exam was performed according to the departmental dose-optimization program  which includes automated exposure control, adjustment of the mA and/or kV according to patient size and/or use of iterative reconstruction technique. CONTRAST:  75mL OMNIPAQUE  IOHEXOL  350 MG/ML SOLN COMPARISON:  Chest radiograph 12/16/2023.  PET-CT 06/23/2023 FINDINGS: CTA CHEST FINDINGS Cardiovascular: Cardiac enlargement. No pericardial effusions. Normal caliber thoracic aorta. No aortic dissection. Calcification of the aorta and coronary arteries. Great vessel origins are patent. Postoperative changes consistent with coronary bypass. No evidence of significant pulmonary embolus. Mediastinum/Nodes: Enteric tube with tip in the stomach. Esophagus is decompressed. Endotracheal tube with tip above the carina. Thyroid gland is unremarkable. Scattered mediastinal lymph nodes are not pathologically enlarged, likely reactive. Lungs/Pleura: Consolidation or atelectasis in both lung bases, possibly pneumonia, compressive atelectasis, or aspiration. Small bilateral pleural effusions. No pneumothorax. Musculoskeletal: Degenerative changes in the spine. Sternotomy wires. No acute bony abnormalities. Review of the MIP images confirms the above findings. CT ABDOMEN and PELVIS FINDINGS Hepatobiliary: No focal liver lesions. Cholelithiasis with several stones in the gallbladder. No wall thickening or inflammatory stranding. No bile duct dilatation. Pancreas: Unremarkable. No pancreatic ductal dilatation or surrounding inflammatory changes. Spleen: Normal in size without focal abnormality. Adrenals/Urinary Tract: Left adrenal gland nodule measuring 2.1 cm diameter. Sixty-five Hounsfield unit density measurement. The lesions are unchanged since prior unenhanced study from 12/18/2020 at which time the density measurement was -3 Hounsfield units. This is consistent with a benign fat containing adenoma and no imaging follow-up is indicated. Kidneys are symmetrical and homogeneous. No hydronephrosis or hydroureter. Bladder is  decompressed.  Stomach/Bowel: Stomach is unremarkable. Colon is decompressed. Wall thickening of the ascending colon and hepatic flexure, new since prior study. This may indicate colitis or inflammatory bowel disease versus reactive change. Dilated fluid-filled small bowel with air-fluid levels and mild diffuse wall thickening. Terminal ileum is decompressed. This is consistent with small-bowel obstruction, possibly due to inflammatory strictures. Vascular/Lymphatic: Aortic atherosclerosis. No enlarged abdominal or pelvic lymph nodes. Reproductive: Prostate is unremarkable. Other: Diffuse infiltration throughout the right-sided abdominal and pelvic mesenteric fat. Multiple loculated fluid collections are demonstrated throughout the right abdomen and pelvis. A right pelvic collection measures 7.4 cm diameter. A right anterior lower quadrant collection measures 8.8 cm diameter. A right anterior para psoas collection measures 9.4 cm diameter. Multiple smaller right upper quadrant collections are demonstrated. Some collections contains small gas bubbles. This appearance is likely indicate multiple abscesses, possibly due to perforation. Could also consider a perforated appendicitis. Small amount of free fluid in the abdomen, likely ascites or reactive fluid. No free intra-abdominal gas. Musculoskeletal: Degenerative changes in the spine. No acute bony abnormalities. Review of the MIP images confirms the above findings. IMPRESSION: 1. Fluid-filled distended small bowel with decompressed terminal ileum consistent with small-bowel obstruction. 2. Irregular wall thickening diffusely throughout the distal small bowel and right colon suggesting enterocolitis or inflammatory bowel disease. Inflammatory strictures may be present. 3. Multiple loculated fluid collections, some with gas, demonstrated in the right abdomen and pelvis indicating abscesses. This is likely due to perforation, possibly related to inflammatory bowel  disease or to an occult perforated appendicitis. The appendix is self is not demonstrated. No free intra-abdominal air. 4. Small amount of free fluid in the abdomen is likely reactive. 5. Cholelithiasis without evidence of acute cholecystitis. 6. Aortic atherosclerosis. 7. Consolidation versus compressive atelectasis in both lung bases. Small bilateral pleural effusions. Critical Value/emergent results were called by telephone at the time of interpretation on 12/16/2023 at 5:28 pm to provider MATTHEW TRIFAN , who verbally acknowledged these results. Electronically Signed   By: Elsie Gravely M.D.   On: 12/16/2023 17:33   CT HEAD WO CONTRAST Result Date: 12/16/2023 CLINICAL DATA:  Altered mental status. EXAM: CT HEAD WITHOUT CONTRAST TECHNIQUE: Contiguous axial images were obtained from the base of the skull through the vertex without intravenous contrast. RADIATION DOSE REDUCTION: This exam was performed according to the departmental dose-optimization program which includes automated exposure control, adjustment of the mA and/or kV according to patient size and/or use of iterative reconstruction technique. COMPARISON:  May 10, 2022 FINDINGS: Brain: No evidence of acute infarction, hemorrhage, hydrocephalus, extra-axial collection or mass lesion/mass effect. There are areas of mildly decreased attenuation within the white matter tracts of the supratentorial brain, consistent with microvascular disease changes. Vascular: No hyperdense vessel or unexpected calcification. Skull: Normal. Negative for fracture or focal lesion. Sinuses/Orbits: No acute finding. Other: None. IMPRESSION: No acute intracranial pathology. Electronically Signed   By: Suzen Dials M.D.   On: 12/16/2023 17:17   DG Chest Port 1 View Result Date: 12/16/2023 CLINICAL DATA:  [id number] Encounter for central line placement [id number] EXAM: PORTABLE CHEST - 1 VIEW COMPARISON:  12/16/2023, 07/16/2023 FINDINGS: Similarly positioned endotracheal  and esophagogastric tubes. Interval placement of a right IJ approach central venous catheter, which terminates at the cavoatrial junction. Improved aeration of the lungs. Retrocardiac airspace opacities. No pleural effusion or pneumothorax. No cardiomegaly. Sternotomy wires and CABG markers. Defibrillator pads. IMPRESSION: 1. Interval placement of a right IJ approach central venous catheter, which terminates at the cavoatrial junction. No pneumothorax.  2. Improved aeration of the lungs with retrocardiac airspace opacities, possibly atelectasis or aspiration. Electronically Signed   By: Rogelia Myers M.D.   On: 12/16/2023 16:28   DG Chest Port 1 View Result Date: 12/16/2023 CLINICAL DATA:  Central line and ET tube placement EXAM: PORTABLE CHEST - 1 VIEW COMPARISON:  July 16, 2023 FINDINGS: Endotracheal tube terminates in the mid trachea. Esophagogastric tube terminates in the region of the stomach. Overlying defibrillator pads. Markedly low lung volumes. No focal airspace consolidation, pleural effusion, or pneumothorax. No cardiomegaly. IMPRESSION: 1. Well-positioned endotracheal and esophagogastric tubes. No pneumothorax. 2. Markedly low lung volumes.  No pneumonia or pleural effusion. Electronically Signed   By: Rogelia Myers M.D.   On: 12/16/2023 16:26    Labs:  CBC: Recent Labs    07/16/23 1551 11/11/23 0951 11/17/23 1246 12/16/23 1524 12/16/23 1535 12/16/23 2124 12/17/23 0333 12/17/23 0422 12/17/23 0908  WBC 9.1 6.5  --  6.0  --   --  4.8  --   --   HGB 14.6 15.6   < > 12.6*   < > 11.9* 12.1* 12.9* 11.9*  HCT 42.5 47.5   < > 37.1*   < > 35.0* 35.4* 38.0* 35.0*  PLT 226 264  --  324  --   --  303  --   --    < > = values in this interval not displayed.    COAGS: Recent Labs    12/16/23 1524  INR 1.2  APTT 29    BMP: Recent Labs    11/18/23 0357 12/16/23 1524 12/16/23 1535 12/16/23 1710 12/16/23 2023 12/16/23 2124 12/17/23 0333 12/17/23 0422 12/17/23 0908  NA  138 129* 130*   < > 124* 121* 125* 123* 123*  K 3.7 2.8* 2.8*   < > 3.7 4.0 4.3 4.5 4.5  CL 104 96* 96*  --  89*  --  87*  --   --   CO2 22 16*  --   --  19*  --  17*  --   --   GLUCOSE 116* 118* 114*  --  137*  --  121*  --   --   BUN 15 55* 50*  --  66*  --  74*  --   --   CALCIUM  8.3* 6.3*  --   --  6.9*  --  7.0*  --   --   CREATININE 0.91 3.92* 4.10*  --  4.45*  --  5.13*  --   --   GFRNONAA >60 17*  --   --  14*  --  12*  --   --    < > = values in this interval not displayed.    LIVER FUNCTION TESTS: Recent Labs    07/16/23 1753 12/16/23 1524 12/17/23 0333  BILITOT 0.8 3.4* 3.9*  AST 35 68* 83*  ALT 41 37 43  ALKPHOS 50 69 85  PROT 6.9 5.2* 5.6*  ALBUMIN  4.0 1.6* 1.5*    TUMOR MARKERS: No results for input(s): AFPTM, CEA, CA199, CHROMGRNA in the last 8760 hours.  Assessment and Plan:  KORBIN MAPPS is a 58 y.o. male with pmhx of HTN, CAD, CABG x 5 04/2022 with post op Afib since stopped on Eliquis  and amiodarone  09/2022, HTN, HLD, GERD, NASH, bipolar/ ADHD, and recent robotic radical prostatectomy on 11/17/23. Presented to ED for weakness and altered mental status with abd pain. Found to be hemodynamically unstable with episodes of afib with RVR and  VT, requiring emergent cardioversion. Post cardioversion, found to be in respiratory distress and pt was intubated and placed on ventilator. CT imaging shows intra-abdominal abscess, concerning for urinoma in setting of recent prostatectomy. IR consulted for abdominal drain placement. Case and imaging reviewed with IR attending Dr. Hughes, who is agreeable to proceed with right abd drain placement.  Risks and benefits discussed with the patient's mother and sister including bleeding, infection, damage to adjacent structures, bowel perforation/fistula connection, and sepsis. All of the mother and sister's questions were answered, mother and sister are agreeable to proceed. Consent signed and in chart.   Thank you for  allowing our service to participate in Glenn Stewart 's care.  Electronically Signed: Kimble VEAR Clas, PA-C   12/17/2023, 9:48 AM      I spent a total of 40 Minutes    in face to face in clinical consultation, greater than 50% of which was counseling/coordinating care for abdominal abscess drain placement.

## 2023-12-17 NOTE — Progress Notes (Signed)
 Glenn Stewart Paras Admit Date: 12/16/2023 12/17/2023 Glenn Stewart Requesting Physician:  Geronimo MD  Reason for Consult:  AKI HPI:  40M PMH including CAD with history of 5 vessel CABG 2024, history of atrial fibrillation, hypertension, hyperlipidemia who underwent robotic assisted laparoscopic radical prostatectomy 11/17/2023 for prostate cancer and presented to the ED yesterday with weakness, encephalopathy, and a distended and tender abdomen.  In the ER he had A-fib with RVR with V. tach requiring cardioversion.  Subsequent to cardioversion he became hypotensive with respiratory distress and was intubated and placed on pressors.  Workup has identified likely infected urinomas postoperative and he is to undergo drain placement with interventional radiology here today.  Presenting creatinine of 3.9 worsened to 5.1 today.  He is anuric.  He is now off pressors but remains intubated on FiO2 of 60%.  Had contrast exposure with CT stents yesterday.  Independently reviewed by me.  Kidneys were without hydronephrosis.  Currently with ABG of 7.3 5/34/91.  K of 4.4, sodium 123.  Bicarbonate 17 with anion gap of 21.  P 11.7    Creatinine, Ser (mg/dL)  Date Value  90/72/7974 5.13 (H)  12/16/2023 4.45 (H)  12/16/2023 4.10 (H)  12/16/2023 3.92 (H)  11/18/2023 0.91  11/11/2023 0.88  07/16/2023 1.01  06/08/2022 1.05  05/18/2022 1.01  05/17/2022 0.91  ] I/Os:  ROS NSAIDS: None IV Contrast Contrasted CT studies at presentation TMP/SMX none Hypotension presented in shock requiring pressors Balance of 12 systems is negative w/ exceptions as above  PMH  Past Medical History:  Diagnosis Date   ADHD (attention deficit hyperactivity disorder)    Anxiety state, unspecified    Bipolar 1 disorder (HCC)    Cancer (HCC)    CHEST PAIN 12/04/2008   Qualifier: Diagnosis of  By: [name] MD, [name]    Coronary artery disease    Depressive disorder, not elsewhere classified    Dermatophytosis of  nail    Esophageal reflux    History of kidney stones    HYPERGLYCEMIA 07/10/2007   Qualifier: Diagnosis of  By: [name] MD, [name]    Low back pain 09/18/2013   Other chronic nonalcoholic liver disease    Pre-diabetes    Problems with hearing    Prostatitis, unspecified    Pure hypercholesterolemia    Screen for STD (sexually transmitted disease) 04/20/2011   Sprain of cruciate ligament of knee    Tear of medial cartilage or meniscus of knee, current    TMJ (temporomandibular joint disorder) 12/25/2010   Unspecified essential hypertension    Unspecified gastritis and gastroduodenitis without mention of hemorrhage    Urethritis, unspecified    PSH  Past Surgical History:  Procedure Laterality Date   CORONARY ARTERY BYPASS GRAFT N/A 05/12/2022   Procedure: CORONARY ARTERY BYPASS GRAFTING (CABG) X FIVE BYPASSES USING OPEN LEFT INTERNAL MAMMARY ARTERY, OPEN LEFT RADIAL ARTERY, AND ENDOSCOPIC RIGHT GREATER SAPHENOUS VEIN HARVEST.;  Surgeon: [name], MD;  Location: [facility] OR;  Service: Open Heart Surgery;  Laterality: N/A;   CYSTOSCOPY W/ LITHOLAPAXY / EHL     LEFT HEART CATH AND CORONARY ANGIOGRAPHY N/A 05/10/2022   Procedure: LEFT HEART CATH AND CORONARY ANGIOGRAPHY;  Surgeon: [name], MD;  Location: [facility] INVASIVE CV LAB;  Service: Cardiovascular;  Laterality: N/A;   No prior surgery     RADIAL ARTERY HARVEST Left 05/12/2022   Procedure: RADIAL ARTERY HARVEST;  Surgeon: [name], MD;  Location: [facility] OR;  Service: Open Heart Surgery;  Laterality: Left;   ROBOT ASSISTED LAPAROSCOPIC RADICAL PROSTATECTOMY N/A 11/17/2023   Procedure: PROSTATECTOMY, RADICAL, ROBOT-ASSISTED, LAPAROSCOPIC;  Surgeon: [name], MD;  Location: [facility] ORS;  Service: Urology;  Laterality: N/A;  ROBOTIC RADICAL PROSTATECTOMY AND BILATERAL PELVIC LYMPH NODE DISSECTION   TEE WITHOUT CARDIOVERSION N/A 05/12/2022   Procedure: TRANSESOPHAGEAL ECHOCARDIOGRAM;  Surgeon:  [name], MD;  Location: [facility] OR;  Service: Open Heart Surgery;  Laterality: N/A;   FH  Family History  Problem Relation Age of Onset   Diabetes Father    Hypertension Father    Heart failure Father    SH  reports that he has never smoked. He has never used smokeless tobacco. He reports current alcohol use. He reports that he does not use drugs. Allergies  Allergies  Allergen Reactions   Sulfonamide Derivatives Hives   Home medications Prior to Admission medications   Medication Sig Start Date End Date Taking? Authorizing Provider  acetaminophen  (TYLENOL ) 325 MG tablet Take 2 tablets (650 mg total) by mouth every 4 (four) hours as needed for moderate pain or headache. 05/19/22   Zimmerman, Donielle M, PA-C  ALPRAZolam  (XANAX ) 1 MG tablet Take 1 mg by mouth Once daily as needed for anxiety. 04/17/11   [provider]  amLODipine  (NORVASC ) 5 MG tablet Take 1 tablet (5 mg total) by mouth daily. 12/07/23   Pietro Redell RAMAN, MD  atorvastatin  (LIPITOR) 80 MG tablet Take 1 tablet (80 mg total) by mouth daily. 12/07/23   Pietro Redell RAMAN, MD  ciprofloxacin  (CIPRO ) 500 MG tablet Take 1 tablet (500 mg total) by mouth 2 (two) times daily. Start day prior to follow up appt for catheter removal 11/17/23   Cory Palma, PA-C  docusate sodium  (COLACE) 100 MG capsule Take 1 capsule (100 mg total) by mouth 2 (two) times daily. 11/17/23   Cory Palma, PA-C  isosorbide  mononitrate (IMDUR ) 30 MG 24 hr tablet Take 1 tablet (30 mg total) by mouth daily. 12/07/23   Pietro Redell RAMAN, MD  metoprolol  tartrate (LOPRESSOR ) 25 MG tablet TAKE ONE TABLET BY MOUTH TWICE DAILY 11/23/23   Goodrich, Callie E, PA-C  pantoprazole  (PROTONIX ) 20 MG tablet Take 1 tablet (20 mg total) by mouth daily. 12/07/23 01/06/24  Pietro Redell RAMAN, MD  sertraline  (ZOLOFT ) 100 MG tablet Take 100 mg by mouth daily.    [provider]  traMADol  (ULTRAM ) 50 MG tablet Take 1-2 tablets (50-100 mg total) by mouth every 6  (six) hours as needed for moderate pain (pain score 4-6) or severe pain (pain score 7-10). 11/17/23   Cory Palma, PA-C    Current Medications Scheduled Meds:  amiodarone   150 mg Intravenous Once   bisacodyl   10 mg Rectal Daily   Chlorhexidine  Gluconate Cloth  6 each Topical Daily   docusate  100 mg Per Tube BID   heparin  injection (subcutaneous)  5,000 Units Subcutaneous Q8H   insulin  aspart  0-15 Units Subcutaneous Q4H   mouth rinse  15 mL Mouth Rinse Q2H   pantoprazole  (PROTONIX ) IV  40 mg Intravenous Daily   polyethylene glycol  17 g Per Tube Daily   Continuous Infusions:  sodium chloride      amiodarone  30 mg/hr (12/17/23 0800)   calcium  gluconate     dextrose  5% lactated ringers      fentaNYL  infusion INTRAVENOUS 125 mcg/hr (12/17/23 0800)   linezolid  (ZYVOX ) IV 600 mg (12/17/23 1019)   norepinephrine  (LEVOPHED ) Adult infusion Stopped (12/17/23 0751)   piperacillin -tazobactam (ZOSYN )  IV  propofol  (DIPRIVAN ) infusion 20 mcg/kg/min (12/17/23 0800)   PRN Meds:.Place/Maintain arterial line **AND** sodium chloride , fentaNYL , mouth rinse  CBC Recent Labs  Lab 12/16/23 1524 12/16/23 1535 12/17/23 0333 12/17/23 0422 12/17/23 0908 12/17/23 1037  WBC 6.0  --  4.8  --   --   --   HGB 12.6*   < > 12.1* 12.9* 11.9* 10.9*  HCT 37.1*   < > 35.4* 38.0* 35.0* 32.0*  MCV 79.8*  --  78.7*  --   --   --   PLT 324  --  303  --   --   --    < > = values in this interval not displayed.   Basic Metabolic Panel Recent Labs  Lab 12/16/23 1524 12/16/23 1535 12/16/23 1710 12/16/23 2023 12/16/23 2124 12/17/23 0333 12/17/23 0422 12/17/23 0908 12/17/23 1037  NA 129* 130* 123* 124* 121* 125* 123* 123* 123*  K 2.8* 2.8* 3.4* 3.7 4.0 4.3 4.5 4.5 4.4  CL 96* 96*  --  89*  --  87*  --   --   --   CO2 16*  --   --  19*  --  17*  --   --   --   GLUCOSE 118* 114*  --  137*  --  121*  --   --   --   BUN 55* 50*  --  66*  --  74*  --   --   --   CREATININE 3.92* 4.10*  --  4.45*  --   5.13*  --   --   --   CALCIUM  6.3*  --   --  6.9*  --  7.0*  --   --   --   PHOS 7.3*  --   --   --   --  11.7*  --   --   --     Physical Exam  Blood pressure 102/68, pulse 97, temperature 98.6 F (37 C), resp. rate (!) 28, height 5' 5 (1.651 m), weight 103.8 kg, SpO2 96%. GEN: Intubated, sedated ENT: ET tube in place, NG tube in place CV: Regular, normal S1 and S2 PULM: Coarse breath sounds bilaterally ABD: Distended abdomen, grimaces to palpation. SKIN: Rashes or lesions EXT: Mepergan edema   Assessment 43M with AKI after presenting with encephalopathy, A-fib with RVR/ventricular tachycardia, found to have likely abdominal urinomas with abscess.  AKI, anuric, baseline normal.  No evidence of obstruction on imaging at presentation.  Suspect ATN but given distended and tight abdomen question abdominal compartment syndrome.  Hyponatremia, progressive likely from free water  excess Abdominal fluid collections concerning for urinoma after radical prostatectomy 11/14/2023 Ileus versus SBO, surgery following, ileus favored VDRF A-fib with RVR and V. tach at presentation, cardiology following on amiodarone  Septic shock on linezolid  and Zosyn , weaning off pressors Hyperphosphatemia setting of AKI Metabolic acidosis  Plan Repeat measurement of intra-abdominal pressure, follow with placement of drains by IR With anuric state and worsening labs with hyperphosphatemia, hyponatremia, acidosis initiate CRRT. CCM to assist with placement of HD catheter Initiate thereafter with no UF, all 4K bath, no heparin  to start   Glenn Stewart  12/17/2023, 11:05 AM

## 2023-12-17 NOTE — H&P (Signed)
 NAME:  Glenn Stewart, MRN:  [id number], DOB:  Jul 21, 1965, LOS: 1 ADMISSION DATE:  12/16/2023, CONSULTATION DATE:  12/16/23 REFERRING MD:  Dr. Cottie, CHIEF COMPLAINT:  weakness   bRIEF  HPI obtained from EMR as pt is intubated and sedated.   77 yoM with PMH of HTN, CAD, CABG x 5 04/2022 with post op Afib since stopped on Eliquis  and amiodarone  09/2022, HTN, HLD, GERD, NASH, bipolar/ ADHD and s/p recent robotic assisted laparoscopic radial prostatectomy on 11/17/23 who presented for weakness and waxing mental status with distended and tender abdominal by EMS found with episodes of Afib with RVR and VT.  Was hemodynamically unstable on arrival, underwent emergent cardioversion and given amiodarone  x2 and magnesium , with emerging rhythm of afib with RVR possible flutter.  Cardiology consulted. Post cardioversion became hypotensive with respiratory distress therefore intubated and placed on norepinephrine .  Difficulty oxygenating post intubation with normal PIP requiring 1.0 and 12 PEEP.  Labs noted for K2.8, Na 129, bicarb 16, BUN/ sCr 55/ 3.92 (recently  ), t. Bili 3.4, AST 68, albumin  1.6, protein 3.4, INR 1.2.  CTH neg, CTA PE and CT a/p w/contrast pending official read.  2.1L of brownish OGT contents s/p OGT placement. Cultures sent, empiric zosyn / vanc started.  Afebrile, anuric and since weaned off pressors.  R internal jugular CVL placed by EDP.  PCCM consulted for admission.     Pertinent  Medical History    has a past medical history of ADHD (attention deficit hyperactivity disorder), Anxiety state, unspecified, Bipolar 1 disorder (HCC), Cancer (HCC), CHEST PAIN (12/04/2008), Coronary artery disease, Depressive disorder, not elsewhere classified, Dermatophytosis of nail, Esophageal reflux, History of kidney stones, HYPERGLYCEMIA (07/10/2007), Low back pain (09/18/2013), Other chronic nonalcoholic liver disease, Pre-diabetes, Problems with hearing, Prostatitis, unspecified, Pure hypercholesterolemia,  Screen for STD (sexually transmitted disease) (04/20/2011), Sprain of cruciate ligament of knee, Tear of medial cartilage or meniscus of knee, current, TMJ (temporomandibular joint disorder) (12/25/2010), Unspecified essential hypertension, Unspecified gastritis and gastroduodenitis without mention of hemorrhage, and Urethritis, unspecified.   has a past surgical history that includes No prior surgery; LEFT HEART CATH AND CORONARY ANGIOGRAPHY (N/A, 05/10/2022); Coronary artery bypass graft (N/A, 05/12/2022); Radial artery harvest (Left, 05/12/2022); TEE without cardioversion (N/A, 05/12/2022); Cystoscopy w/ litholapaxy / EHL; and Robot assisted laparoscopic radical prostatectomy (N/A, 11/17/2023).   Significant Hospital Events: Including procedures, antibiotic start and stop dates in addition to other pertinent events   9/26> admit  Interim History / Subjective:    9/27: 90% fio2 on ventilator. On diprivan  gtt, fent gtt, amio gtt. CVP 9.  AFebrile. Cutlure negative so far. cReat 5.13 and climbing (Baseline 0.91 on 8/292/25). AKI started 12/16/23. Normal lactate byu Ionized calcim is low. Seen by urology  & [facility]- recommending  IR attemt of intrabdominal abscess that is in RLQ and tracks down bladder..  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 [facility] does not think there is appendicit or diveritculits. And that SBO/Colitis is secondary to urinoma  Off levop and followed commands  aNURIC - 100cc only since admit  CT   -  IMPRESSION: 1. CT cystogram with Foley catheter in place. Contrast material leaks along the catheter through the urethra. There is focal irregularity at the junction of the bladder base and urethra with small anterior contrast pooling suggesting contained perforation. No intraperitoneal contrast extravasation.  2. Appearance examination is otherwise unchanged. Again demonstrated are multiple loculated fluid collections in the abdomen and pelvis consistent with abscesses.  Wall thickening of the  cecum and terminal ileum. Fluid-filled dilated small bowel appear mildly improved.   :3/. Consolidation or atelectasis in both lung bases, possibly pneumonia, compressive atelectasis, or aspiration. Small bilateral pleural effusions  4. Wall thickening of the ascending colon and hepatic flexure, new since prior study. This may indicate colitis or inflammatory bowel disease versus reactive change. Dilated fluid-filled small bowel with air-fluid levels and mild diffuse wall thickening. Terminal ileum is decompressed. This is consistent with small-bowel obstruction, possibly due to inflammatory strictures.  Objective    Blood pressure 112/71, pulse 100, temperature 98.4 F (36.9 C), resp. rate (!) 28, height 5' 5 (1.651 m), weight 103.8 kg, SpO2 97%. CVP:  [9 mmHg-19 mmHg] 9 mmHg  Vent Mode: PRVC FiO2 (%):  [80 %-100 %] 80 % Set Rate:  [20 bmp-28 bmp] 28 bmp Vt Set:  [450 mL-510 mL] 510 mL PEEP:  [5 cmH20-14 cmH20] 14 cmH20 Plateau Pressure:  [23 cmH20-30 cmH20] 30 cmH20   Intake/Output Summary (Last 24 hours) at 12/17/2023 9176 Last data filed at 12/17/2023 0700 Gross per 24 hour  Intake 4193.72 ml  Output 2350 ml  Net 1843.72 ml   Filed Weights   12/16/23 1907 12/17/23 0414  Weight: 103.8 kg 103.8 kg    Examination:  General Appearance:  Looks criticall ill OBESE - _ Head:  Normocephalic, without obvious abnormality, atraumatic Eyes:  PERRL - yes, conjunctiva/corneas - mudd     Ears:  Normal external ear canals, both ears Nose:  G tube - no Throat:  ETT TUBE - yes , OG tube - yes Neck:  Supple,  No enlargement/tenderness/nodules Lungs: Clear to auscultation bilaterally, Ventilator   Synchrony - yes with 80% fip2, peep 14 an dpulse ox 98% Heart:  S1 and S2 normal, no murmur, CVP - 9.  Pressors - just off levophed  Abdomen:  Soft, no masses, no organomegaly Genitalia / Rectal:  Not done Extremities:  Extremities- intact Skin:  ntact in exposed  areas . Sacral area - not examined Neurologic:  Sedation - fent gtt, diprivan  gtt -> RASS - -4 . Moves all 4s - yes. CAM-ICU - x . Orientation - did follow commands to RN     Resolved problem list   Assessment and Plan    Prostate cancer s/p robotic assisted laparoscopic radial prostatectomy on 11/17/23 Shock, likely septic, -  at admit due to Urinoma with INtrabd abscess and secondary COlitis with SBPO   12/17/2023 - just came off pressors   P:  IR consult for Intrabd abscess drain -> Dw/ Dr Delcia  - Urology wants creatinine level on the abscess -  Urology consult - d/w Dr Watt at bedside [facility] - reviewed DR Sheldon notes MAP goal > 65; give 1L fluids bolus  Acute hypoxic and hypercarbic respiratory failure, suspected early aspiration of BLL PNA/ pneumonitis concerning for developing early ARDS/ALI - CTA PE neg for acute PE  12/17/2023 - > does NOT meet criteria for SBT/Extubation in setting of Acute Respiratory Failure due to high fio2 and peep needs BUT CXR not fully c/w ALI. Has basal consolidation   P:  PRVC VAP bundle Peep/Fio2 as needed for pulse ox > 88% Ok for permissive hypercapnia  ABG as needed  Afib with RVR  VT, monomorphic Elevated troponin- suspected demand  Hx CAD, prior CABG x 5 04/2022, and post op Afib (off amio/ eliquis  09/2022), HTN, HLD - s/p emergent cardioversion, amiodarone , mag  9/27: in Sinus  PLAN - appreciate cardiology input - suspected in setting of  multiple electrolyte abnormalities and critical illness - optimize electrolytes/ replete prn> K, calcium  gluconate now.  Trend on labs - cont amiodarone  gtt - AIM TO STOP ASAP due to risk for Acute Lung injury - defer heparin  gtt for now pending surgery eval - echo  - trend topr  AKI with meabolic acidosis  - no hydronephrosis on CT, bladder decompressed . AKI worse 9/27 with ANURIA  P:  - 1 more Liter LR 12/17/23 AM  - START Bic gtt - foley - avoid nephrotoxins, renal dose meds,  hemodynamic support as above - renal consult DR Marlee called  ANEMIA of critical illness  9/27: Mild and stable  Planm - - PRBC for hgb </= 6.9gm%    - exceptions are   -  if ACS susepcted/confirmed then transfuse for hgb </= 8.0gm%,  or    -  active bleeding with hemodynamic instability, then transfuse regardless of hemoglobin value   At at all times try to transfuse 1 unit prbc as possible with exception of active hemorrhage    Elevated t. Bili with transaminitis - suspected in setting of sepsis, trend on labs   Hyponattermia  Plan  - monitor    Best practice:  Diet: NPO - start d5 12/17/23 Pain/Anxiety/Delirium protocol (if indicated): Diprivan  gtt and Fent gtt VAP protocol (if indicated): YES DVT prophylaxis: heparin  5000 tid 12/17/23 GI prophylaxis: protinix Glucose control: ssi Mobility: bed rest Code Status: full code DISPOSITION:L Keep in ICU 12/17/23 Family Communication:   - Called Mom Rock Spanner [phone] - she said she is 63 and deferred that I called daughter but did inform her that he is on life support  - Called Daughter Lucian Pond [phone]- called and  updated 9:22 AM 12/17/2023 (she is in [place] and planning to come)  Disposition: ICU   ATTESTATION & SIGNATURE   The patient LOVE MILBOURNE is critically ill with multiple organ systems failure and requires high complexity decision making for assessment and support, frequent evaluation and titration of therapies, application of advanced monitoring technologies and extensive interpretation of multiple databases and discussion with other appropriate health care personnel such as bedside nurses, social workers, case Production designer, theatre/television/film, consultants, respiratory therapists, nutritionists, secretaries etc.,  Critical care time includes but is not restricted to just documentation time. Documentation can happen in parallel or sequential to care time depending on case mix urgency and priorities for the shift.  So, overall critical Care Time devoted to patient care services described in this note is  60  Minutes.   This time reflects time of care of this signee Dr Dorethia Cave which includ does not reflect procedure time, or teaching time or supervisory time of PA/NP/Med student/Med Resident etc but could involve care discussion time     Dr. Dorethia Cave, M.D., [facility].C.P Pulmonary and Critical Care Medicine Staff Physician, [facility] Pager: [phone], If no answer or between  15:00h - 7:00h: call [phone]  12/17/2023 8:23 AM    LABS    PULMONARY Recent Labs  Lab 12/16/23 1535 12/16/23 1710 12/16/23 2124 12/17/23 0422  PHART  --  7.219* 7.301* 7.292*  PCO2ART  --  54.3* 41.3 42.5  PO2ART  --  74* 102 95  HCO3  --  22.3 20.4 20.6  TCO2 17* 24 22 22   O2SAT  --  92 97 97    CBC Recent Labs  Lab 12/16/23 1524 12/16/23  1535 12/16/23 2124 12/17/23 0333 12/17/23 0422  HGB 12.6*   < > 11.9* 12.1* 12.9*  HCT 37.1*   < > 35.0* 35.4* 38.0*  WBC 6.0  --   --  4.8  --   PLT 324  --   --  303  --    < > = values in this interval not displayed.    COAGULATION Recent Labs  Lab 12/16/23 1524  INR 1.2    CARDIAC  No results for input(s): TROPONINI in the last 168 hours. No results for input(s): PROBNP in the last 168 hours.   CHEMISTRY Recent Labs  Lab 12/16/23 1524 12/16/23 1535 12/16/23 1710 12/16/23 2023 12/16/23 2124 12/17/23 0333 12/17/23 0422  NA 129* 130* 123* 124* 121* 125* 123*  K 2.8* 2.8* 3.4* 3.7 4.0 4.3 4.5  CL 96* 96*  --  89*  --  87*  --   CO2 16*  --   --  19*  --  17*  --   GLUCOSE 118* 114*  --  137*  --  121*  --   BUN 55* 50*  --  66*  --  74*  --   CREATININE 3.92* 4.10*  --  4.45*  --  5.13*  --   CALCIUM  6.3*  --   --  6.9*  --  7.0*  --   MG 2.3  --   --   --   --  2.9*  --   PHOS 7.3*  --   --   --   --  11.7*  --    Estimated Creatinine Clearance: 17.2 mL/min (A) (by C-G  formula based on SCr of 5.13 mg/dL (H)).   LIVER Recent Labs  Lab 12/16/23 1524 12/17/23 0333  AST 68* 83*  ALT 37 43  ALKPHOS 69 85  BILITOT 3.4* 3.9*  PROT 5.2* 5.6*  ALBUMIN  1.6* 1.5*  INR 1.2  --      INFECTIOUS Recent Labs  Lab 12/16/23 1600 12/16/23 1839 12/16/23 2023  LATICACIDVEN 3.5* 1.2 1.3     ENDOCRINE CBG (last 3)  Recent Labs    12/16/23 2308 12/17/23 0320 12/17/23 0707  GLUCAP 133* 123* 106*         IMAGING x48h  - image(s) personally visualized  -   highlighted in bold DG Chest Port 1 View Result Date: 12/17/2023 EXAM: 1 VIEW(S) XRAY OF THE CHEST 12/17/2023 05:48:00 AM COMPARISON: 12/16/2023 CLINICAL HISTORY: Respiratory failure; Hx of: chest pain, coronary artery disease; Chief complaint: Loss of consciousness FINDINGS: LINES, TUBES AND DEVICES: Right IJ central venous catheter in place with tip over the SVC. ETT in place with tip 3.7 cm above the carina. Enteric tube in place, courses below the diaphragm with tip obscured. Overlying defibrillator pads. LUNGS AND PLEURA: Low lung volumes. Mild right basilar atelectasis. Similar left retrocardiac airspace opacity, likely atelectasis. No pulmonary edema. No pleural effusion. No pneumothorax. HEART AND MEDIASTINUM: No acute abnormality of the cardiac and mediastinal silhouettes. BONES AND SOFT TISSUES: Median sternotomy and CABG changes noted. IMPRESSION: 1. Low lung volumes with mild basilar atelectasis/retrocardiac atelectasis. 2. Support devices appropriately positioned, including right IJ central venous catheter with tip in the SVC, endotracheal tube 3.7 cm above the carina, and enteric tube coursing below the diaphragm. Electronically signed by: Waddell Calk MD 12/17/2023 06:09 AM EDT RP Workstation: [id number]   CT CYSTOGRAM ABD/PELVIS Result Date: 12/16/2023 CLINICAL DATA:  Sepsis with concern for bladder perforation. EXAM: CT CYSTOGRAM (CT ABDOMEN AND  PELVIS WITH CONTRAST) TECHNIQUE:  Multi-detector CT imaging through the abdomen and pelvis was performed after dilute contrast had been introduced into the bladder for the purposes of performing CT cystography. RADIATION DOSE REDUCTION: This exam was performed according to the departmental dose-optimization program which includes automated exposure control, adjustment of the mA and/or kV according to patient size and/or use of iterative reconstruction technique. CONTRAST:  50mL OMNIPAQUE  IOHEXOL  300 MG/ML  SOLN COMPARISON:  12/16/2023 FINDINGS: Lower chest: Atelectasis or consolidation in both lung bases. Hepatobiliary: Cholelithiasis.  No bile duct dilatation. Pancreas: Unremarkable. No pancreatic ductal dilatation or surrounding inflammatory changes. Spleen: Normal in size without focal abnormality. Adrenals/Urinary Tract: Left adrenal gland nodules. See previous description from prior CT earlier today. Kidneys are symmetrical. No hydronephrosis or hydroureter. Contrast material fills the bladder. Foley catheter in the bladder. No bladder wall thickening. Contrast material is demonstrated leaking along the catheter through the urethra to the distal penile region. There is focal irregularity at the junction of the urethra with the bladder base with small anterior contrast pooling suggesting contained leak. No contrast extravasation to suggest intraperitoneal leakage. Remainder the urethra appears otherwise intact. Stomach/Bowel: Enteric tube tip in the stomach. Dilated fluid-filled small bowel demonstrates some decompression since previous study. Colon is decompressed. Persistent wall thickening in the terminal ileum and colon. Vascular/Lymphatic: Aortic atherosclerosis. No enlarged abdominal or pelvic lymph nodes. Reproductive: Prostate gland is surgically absent. No significant pelvic lymphadenopathy. Other: Multiple loculated fluid collections demonstrated in the right abdomen and pelvis as previously discussed. Small amount of free fluid.  Diffuse stranding throughout the intra-abdominal fat. Changes are consistent with multiple abscesses. Musculoskeletal: No acute bony abnormalities. No focal bone lesions. IMPRESSION: 1. CT cystogram with Foley catheter in place. Contrast material leaks along the catheter through the urethra. There is focal irregularity at the junction of the bladder base and urethra with small anterior contrast pooling suggesting contained perforation. No intraperitoneal contrast extravasation. 2. Appearance examination is otherwise unchanged. Again demonstrated are multiple loculated fluid collections in the abdomen and pelvis consistent with abscesses. Wall thickening of the cecum and terminal ileum. Fluid-filled dilated small bowel appear mildly improved. Electronically Signed   By: Elsie Gravely M.D.   On: 12/16/2023 22:41   CT Angio Chest Pulmonary Embolism (PE) W or WO Contrast Result Date: 12/16/2023 CLINICAL DATA:  Aortic aneurysm suspected. Severe right-sided abdominal pain and abdominal distention. Decreased oxygen saturation. EXAM: CT ANGIOGRAPHY CHEST CT ABDOMEN AND PELVIS WITH CONTRAST TECHNIQUE: Multidetector CT imaging of the chest was performed using the standard protocol during bolus administration of intravenous contrast. Multiplanar CT image reconstructions and MIPs were obtained to evaluate the vascular anatomy. Multidetector CT imaging of the abdomen and pelvis was performed using the standard protocol during bolus administration of intravenous contrast. RADIATION DOSE REDUCTION: This exam was performed according to the departmental dose-optimization program which includes automated exposure control, adjustment of the mA and/or kV according to patient size and/or use of iterative reconstruction technique. CONTRAST:  75mL OMNIPAQUE  IOHEXOL  350 MG/ML SOLN COMPARISON:  Chest radiograph 12/16/2023.  PET-CT 06/23/2023 FINDINGS: CTA CHEST FINDINGS Cardiovascular: Cardiac enlargement. No pericardial effusions.  Normal caliber thoracic aorta. No aortic dissection. Calcification of the aorta and coronary arteries. Great vessel origins are patent. Postoperative changes consistent with coronary bypass. No evidence of significant pulmonary embolus. Mediastinum/Nodes: Enteric tube with tip in the stomach. Esophagus is decompressed. Endotracheal tube with tip above the carina. Thyroid gland is unremarkable. Scattered mediastinal lymph nodes are not pathologically enlarged, likely reactive. Lungs/Pleura: Consolidation  or atelectasis in both lung bases, possibly pneumonia, compressive atelectasis, or aspiration. Small bilateral pleural effusions. No pneumothorax. Musculoskeletal: Degenerative changes in the spine. Sternotomy wires. No acute bony abnormalities. Review of the MIP images confirms the above findings. CT ABDOMEN and PELVIS FINDINGS Hepatobiliary: No focal liver lesions. Cholelithiasis with several stones in the gallbladder. No wall thickening or inflammatory stranding. No bile duct dilatation. Pancreas: Unremarkable. No pancreatic ductal dilatation or surrounding inflammatory changes. Spleen: Normal in size without focal abnormality. Adrenals/Urinary Tract: Left adrenal gland nodule measuring 2.1 cm diameter. Sixty-five Hounsfield unit density measurement. The lesions are unchanged since prior unenhanced study from 12/18/2020 at which time the density measurement was -3 Hounsfield units. This is consistent with a benign fat containing adenoma and no imaging follow-up is indicated. Kidneys are symmetrical and homogeneous. No hydronephrosis or hydroureter. Bladder is decompressed. Stomach/Bowel: Stomach is unremarkable. Colon is decompressed. Wall thickening of the ascending colon and hepatic flexure, new since prior study. This may indicate colitis or inflammatory bowel disease versus reactive change. Dilated fluid-filled small bowel with air-fluid levels and mild diffuse wall thickening. Terminal ileum is decompressed.  This is consistent with small-bowel obstruction, possibly due to inflammatory strictures. Vascular/Lymphatic: Aortic atherosclerosis. No enlarged abdominal or pelvic lymph nodes. Reproductive: Prostate is unremarkable. Other: Diffuse infiltration throughout the right-sided abdominal and pelvic mesenteric fat. Multiple loculated fluid collections are demonstrated throughout the right abdomen and pelvis. A right pelvic collection measures 7.4 cm diameter. A right anterior lower quadrant collection measures 8.8 cm diameter. A right anterior para psoas collection measures 9.4 cm diameter. Multiple smaller right upper quadrant collections are demonstrated. Some collections contains small gas bubbles. This appearance is likely indicate multiple abscesses, possibly due to perforation. Could also consider a perforated appendicitis. Small amount of free fluid in the abdomen, likely ascites or reactive fluid. No free intra-abdominal gas. Musculoskeletal: Degenerative changes in the spine. No acute bony abnormalities. Review of the MIP images confirms the above findings. IMPRESSION: 1. Fluid-filled distended small bowel with decompressed terminal ileum consistent with small-bowel obstruction. 2. Irregular wall thickening diffusely throughout the distal small bowel and right colon suggesting enterocolitis or inflammatory bowel disease. Inflammatory strictures may be present. 3. Multiple loculated fluid collections, some with gas, demonstrated in the right abdomen and pelvis indicating abscesses. This is likely due to perforation, possibly related to inflammatory bowel disease or to an occult perforated appendicitis. The appendix is self is not demonstrated. No free intra-abdominal air. 4. Small amount of free fluid in the abdomen is likely reactive. 5. Cholelithiasis without evidence of acute cholecystitis. 6. Aortic atherosclerosis. 7. Consolidation versus compressive atelectasis in both lung bases. Small bilateral pleural  effusions. Critical Value/emergent results were called by telephone at the time of interpretation on 12/16/2023 at 5:28 pm to provider MATTHEW TRIFAN , who verbally acknowledged these results. Electronically Signed   By: Elsie Gravely M.D.   On: 12/16/2023 17:33   CT ABDOMEN PELVIS W CONTRAST Result Date: 12/16/2023 CLINICAL DATA:  Aortic aneurysm suspected. Severe right-sided abdominal pain and abdominal distention. Decreased oxygen saturation. EXAM: CT ANGIOGRAPHY CHEST CT ABDOMEN AND PELVIS WITH CONTRAST TECHNIQUE: Multidetector CT imaging of the chest was performed using the standard protocol during bolus administration of intravenous contrast. Multiplanar CT image reconstructions and MIPs were obtained to evaluate the vascular anatomy. Multidetector CT imaging of the abdomen and pelvis was performed using the standard protocol during bolus administration of intravenous contrast. RADIATION DOSE REDUCTION: This exam was performed according to the departmental dose-optimization program which includes  automated exposure control, adjustment of the mA and/or kV according to patient size and/or use of iterative reconstruction technique. CONTRAST:  75mL OMNIPAQUE  IOHEXOL  350 MG/ML SOLN COMPARISON:  Chest radiograph 12/16/2023.  PET-CT 06/23/2023 FINDINGS: CTA CHEST FINDINGS Cardiovascular: Cardiac enlargement. No pericardial effusions. Normal caliber thoracic aorta. No aortic dissection. Calcification of the aorta and coronary arteries. Great vessel origins are patent. Postoperative changes consistent with coronary bypass. No evidence of significant pulmonary embolus. Mediastinum/Nodes: Enteric tube with tip in the stomach. Esophagus is decompressed. Endotracheal tube with tip above the carina. Thyroid gland is unremarkable. Scattered mediastinal lymph nodes are not pathologically enlarged, likely reactive. Lungs/Pleura: Consolidation or atelectasis in both lung bases, possibly pneumonia, compressive atelectasis,  or aspiration. Small bilateral pleural effusions. No pneumothorax. Musculoskeletal: Degenerative changes in the spine. Sternotomy wires. No acute bony abnormalities. Review of the MIP images confirms the above findings. CT ABDOMEN and PELVIS FINDINGS Hepatobiliary: No focal liver lesions. Cholelithiasis with several stones in the gallbladder. No wall thickening or inflammatory stranding. No bile duct dilatation. Pancreas: Unremarkable. No pancreatic ductal dilatation or surrounding inflammatory changes. Spleen: Normal in size without focal abnormality. Adrenals/Urinary Tract: Left adrenal gland nodule measuring 2.1 cm diameter. Sixty-five Hounsfield unit density measurement. The lesions are unchanged since prior unenhanced study from 12/18/2020 at which time the density measurement was -3 Hounsfield units. This is consistent with a benign fat containing adenoma and no imaging follow-up is indicated. Kidneys are symmetrical and homogeneous. No hydronephrosis or hydroureter. Bladder is decompressed. Stomach/Bowel: Stomach is unremarkable. Colon is decompressed. Wall thickening of the ascending colon and hepatic flexure, new since prior study. This may indicate colitis or inflammatory bowel disease versus reactive change. Dilated fluid-filled small bowel with air-fluid levels and mild diffuse wall thickening. Terminal ileum is decompressed. This is consistent with small-bowel obstruction, possibly due to inflammatory strictures. Vascular/Lymphatic: Aortic atherosclerosis. No enlarged abdominal or pelvic lymph nodes. Reproductive: Prostate is unremarkable. Other: Diffuse infiltration throughout the right-sided abdominal and pelvic mesenteric fat. Multiple loculated fluid collections are demonstrated throughout the right abdomen and pelvis. A right pelvic collection measures 7.4 cm diameter. A right anterior lower quadrant collection measures 8.8 cm diameter. A right anterior para psoas collection measures 9.4 cm  diameter. Multiple smaller right upper quadrant collections are demonstrated. Some collections contains small gas bubbles. This appearance is likely indicate multiple abscesses, possibly due to perforation. Could also consider a perforated appendicitis. Small amount of free fluid in the abdomen, likely ascites or reactive fluid. No free intra-abdominal gas. Musculoskeletal: Degenerative changes in the spine. No acute bony abnormalities. Review of the MIP images confirms the above findings. IMPRESSION: 1. Fluid-filled distended small bowel with decompressed terminal ileum consistent with small-bowel obstruction. 2. Irregular wall thickening diffusely throughout the distal small bowel and right colon suggesting enterocolitis or inflammatory bowel disease. Inflammatory strictures may be present. 3. Multiple loculated fluid collections, some with gas, demonstrated in the right abdomen and pelvis indicating abscesses. This is likely due to perforation, possibly related to inflammatory bowel disease or to an occult perforated appendicitis. The appendix is self is not demonstrated. No free intra-abdominal air. 4. Small amount of free fluid in the abdomen is likely reactive. 5. Cholelithiasis without evidence of acute cholecystitis. 6. Aortic atherosclerosis. 7. Consolidation versus compressive atelectasis in both lung bases. Small bilateral pleural effusions. Critical Value/emergent results were called by telephone at the time of interpretation on 12/16/2023 at 5:28 pm to provider MATTHEW TRIFAN , who verbally acknowledged these results. Electronically Signed   By: Elsie  Mannie M.D.   On: 12/16/2023 17:33   CT HEAD WO CONTRAST Result Date: 12/16/2023 CLINICAL DATA:  Altered mental status. EXAM: CT HEAD WITHOUT CONTRAST TECHNIQUE: Contiguous axial images were obtained from the base of the skull through the vertex without intravenous contrast. RADIATION DOSE REDUCTION: This exam was performed according to the  departmental dose-optimization program which includes automated exposure control, adjustment of the mA and/or kV according to patient size and/or use of iterative reconstruction technique. COMPARISON:  May 10, 2022 FINDINGS: Brain: No evidence of acute infarction, hemorrhage, hydrocephalus, extra-axial collection or mass lesion/mass effect. There are areas of mildly decreased attenuation within the white matter tracts of the supratentorial brain, consistent with microvascular disease changes. Vascular: No hyperdense vessel or unexpected calcification. Skull: Normal. Negative for fracture or focal lesion. Sinuses/Orbits: No acute finding. Other: None. IMPRESSION: No acute intracranial pathology. Electronically Signed   By: Suzen Dials M.D.   On: 12/16/2023 17:17   DG Chest Port 1 View Result Date: 12/16/2023 CLINICAL DATA:  [id number] Encounter for central line placement [id number] EXAM: PORTABLE CHEST - 1 VIEW COMPARISON:  12/16/2023, 07/16/2023 FINDINGS: Similarly positioned endotracheal and esophagogastric tubes. Interval placement of a right IJ approach central venous catheter, which terminates at the cavoatrial junction. Improved aeration of the lungs. Retrocardiac airspace opacities. No pleural effusion or pneumothorax. No cardiomegaly. Sternotomy wires and CABG markers. Defibrillator pads. IMPRESSION: 1. Interval placement of a right IJ approach central venous catheter, which terminates at the cavoatrial junction. No pneumothorax. 2. Improved aeration of the lungs with retrocardiac airspace opacities, possibly atelectasis or aspiration. Electronically Signed   By: Rogelia Myers M.D.   On: 12/16/2023 16:28   DG Chest Port 1 View Result Date: 12/16/2023 CLINICAL DATA:  Central line and ET tube placement EXAM: PORTABLE CHEST - 1 VIEW COMPARISON:  July 16, 2023 FINDINGS: Endotracheal tube terminates in the mid trachea. Esophagogastric tube terminates in the region of the stomach. Overlying defibrillator  pads. Markedly low lung volumes. No focal airspace consolidation, pleural effusion, or pneumothorax. No cardiomegaly. IMPRESSION: 1. Well-positioned endotracheal and esophagogastric tubes. No pneumothorax. 2. Markedly low lung volumes.  No pneumonia or pleural effusion. Electronically Signed   By: Rogelia Myers M.D.   On: 12/16/2023 16:26

## 2023-12-18 DIAGNOSIS — J9601 Acute respiratory failure with hypoxia: Secondary | ICD-10-CM | POA: Diagnosis not present

## 2023-12-18 DIAGNOSIS — I472 Ventricular tachycardia, unspecified: Secondary | ICD-10-CM | POA: Diagnosis not present

## 2023-12-18 DIAGNOSIS — R579 Shock, unspecified: Secondary | ICD-10-CM | POA: Diagnosis not present

## 2023-12-18 DIAGNOSIS — N179 Acute kidney failure, unspecified: Secondary | ICD-10-CM | POA: Diagnosis not present

## 2023-12-18 DIAGNOSIS — E8721 Acute metabolic acidosis: Secondary | ICD-10-CM | POA: Diagnosis not present

## 2023-12-18 LAB — COMPREHENSIVE METABOLIC PANEL WITH GFR
ALT: 41 U/L (ref 0–44)
AST: 85 U/L — ABNORMAL HIGH (ref 15–41)
Albumin: <1.5 g/dL — ABNORMAL LOW (ref 3.5–5.0)
Alkaline Phosphatase: 105 U/L (ref 38–126)
Anion gap: 14 (ref 5–15)
BUN: 68 mg/dL — ABNORMAL HIGH (ref 6–20)
CO2: 20 mmol/L — ABNORMAL LOW (ref 22–32)
Calcium: 7 mg/dL — ABNORMAL LOW (ref 8.9–10.3)
Chloride: 93 mmol/L — ABNORMAL LOW (ref 98–111)
Creatinine, Ser: 4.55 mg/dL — ABNORMAL HIGH (ref 0.61–1.24)
GFR, Estimated: 14 mL/min — ABNORMAL LOW (ref >60)
Glucose, Bld: 130 mg/dL — ABNORMAL HIGH (ref 70–99)
Potassium: 4.1 mmol/L (ref 3.5–5.1)
Sodium: 127 mmol/L — ABNORMAL LOW (ref 135–145)
Total Bilirubin: 3.1 mg/dL — ABNORMAL HIGH (ref 0.0–1.2)
Total Protein: 5.4 g/dL — ABNORMAL LOW (ref 6.5–8.1)

## 2023-12-18 LAB — RENAL FUNCTION PANEL
Albumin: <1.5 g/dL — ABNORMAL LOW (ref 3.5–5.0)
Albumin: <1.5 g/dL — ABNORMAL LOW (ref 3.5–5.0)
Anion gap: 15 (ref 5–15)
Anion gap: 12 (ref 5–15)
BUN: 68 mg/dL — ABNORMAL HIGH (ref 6–20)
BUN: 54 mg/dL — ABNORMAL HIGH (ref 6–20)
CO2: 20 mmol/L — ABNORMAL LOW (ref 22–32)
CO2: 22 mmol/L (ref 22–32)
Calcium: 7 mg/dL — ABNORMAL LOW (ref 8.9–10.3)
Calcium: 7.3 mg/dL — ABNORMAL LOW (ref 8.9–10.3)
Chloride: 91 mmol/L — ABNORMAL LOW (ref 98–111)
Chloride: 97 mmol/L — ABNORMAL LOW (ref 98–111)
Creatinine, Ser: 4.39 mg/dL — ABNORMAL HIGH (ref 0.61–1.24)
Creatinine, Ser: 3.54 mg/dL — ABNORMAL HIGH (ref 0.61–1.24)
GFR, Estimated: 15 mL/min — ABNORMAL LOW
GFR, Estimated: 19 mL/min — ABNORMAL LOW (ref >60)
Glucose, Bld: 129 mg/dL — ABNORMAL HIGH (ref 70–99)
Glucose, Bld: 133 mg/dL — ABNORMAL HIGH (ref 70–99)
Phosphorus: 6.1 mg/dL — ABNORMAL HIGH (ref 2.5–4.6)
Phosphorus: 4 mg/dL (ref 2.5–4.6)
Potassium: 4.1 mmol/L (ref 3.5–5.1)
Potassium: 4.1 mmol/L (ref 3.5–5.1)
Sodium: 126 mmol/L — ABNORMAL LOW (ref 135–145)
Sodium: 131 mmol/L — ABNORMAL LOW (ref 135–145)

## 2023-12-18 LAB — ECHOCARDIOGRAM COMPLETE
AR max vel: 3.36 cm2
AV Peak grad: 7.8 mmHg
Ao pk vel: 1.4 m/s
Area-P 1/2: 4.21 cm2
Height: 65 in
S' Lateral: 3.2 cm
Weight: 3661.4 [oz_av]

## 2023-12-18 LAB — GLUCOSE, CAPILLARY
Glucose-Capillary: 132 mg/dL — ABNORMAL HIGH (ref 70–99)
Glucose-Capillary: 127 mg/dL — ABNORMAL HIGH (ref 70–99)
Glucose-Capillary: 145 mg/dL — ABNORMAL HIGH (ref 70–99)
Glucose-Capillary: 129 mg/dL — ABNORMAL HIGH (ref 70–99)
Glucose-Capillary: 122 mg/dL — ABNORMAL HIGH (ref 70–99)
Glucose-Capillary: 151 mg/dL — ABNORMAL HIGH (ref 70–99)

## 2023-12-18 LAB — CBC
HCT: 29.6 % — ABNORMAL LOW (ref 39.0–52.0)
Hemoglobin: 10.6 g/dL — ABNORMAL LOW (ref 13.0–17.0)
MCH: 27.5 pg (ref 26.0–34.0)
MCHC: 35.8 g/dL (ref 30.0–36.0)
MCV: 76.7 fL — ABNORMAL LOW (ref 80.0–100.0)
Platelets: 260 K/uL (ref 150–400)
RBC: 3.86 MIL/uL — ABNORMAL LOW (ref 4.22–5.81)
RDW: 14.6 % (ref 11.5–15.5)
WBC: 9.8 K/uL (ref 4.0–10.5)
nRBC: 0.3 % — ABNORMAL HIGH (ref 0.0–0.2)

## 2023-12-18 LAB — PREALBUMIN: Prealbumin: <5 mg/dL — ABNORMAL LOW (ref 18–38)

## 2023-12-18 LAB — MAGNESIUM: Magnesium: 2.8 mg/dL — ABNORMAL HIGH (ref 1.7–2.4)

## 2023-12-18 MED ORDER — VITAL HP 1.0 CAL PO LIQD
1000.0000 mL | ORAL | Status: DC
  Administered 2023-12-18: 1000 mL

## 2023-12-18 NOTE — Progress Notes (Signed)
 Admit: 12/16/2023 LOS: 2  17M with AKI after presenting with encephalopathy, A-fib with RVR/ventricular tachycardia, found to have likely abdominal urinomas with abscess.   Current CRRT Prescription: Start Date: 12/17/23 Catheter: L internal jugular Temp catheter placed 9/27 by CCM BFR: 250 Pre Blood Pump: 400 4K DFR: 1500 4K Replacement Rate: 400 4K Goal UF: net even Anticoagulation: none Clotting: none   Subjective:  Doing well since CRRT was initiated, on very low-dose norepinephrine  No urine output K4.1, phosphorus 6.1, sodium 127 Abdominal drain placed into fluid collection, creatinine the same as the serum not consistent with urinoma, 1.1 L output since placement Bladder pressure was 17 when measured yesterday  09/27 0701 - 09/28 0700 In: 5115.5 [I.V.:3201.6; NG/GT:60; IV Piggyback:1848.9] Out: 3464 [Urine:60; Emesis/NG output:200; Drains:1125]  Filed Weights   12/16/23 1907 12/17/23 0414 12/18/23 0124  Weight: 103.8 kg 103.8 kg 103.5 kg    Scheduled Meds:  amiodarone   150 mg Intravenous Once   bisacodyl   10 mg Rectal Daily   Chlorhexidine  Gluconate Cloth  6 each Topical Daily   feeding supplement (VITAL HIGH PROTEIN)  1,000 mL Per Tube Q24H   heparin  injection (subcutaneous)  5,000 Units Subcutaneous Q8H   insulin  aspart  0-15 Units Subcutaneous Q4H   mouth rinse  15 mL Mouth Rinse Q2H   pantoprazole  (PROTONIX ) IV  40 mg Intravenous Daily   sodium chloride  flush  5 mL Intracatheter Q8H   Continuous Infusions:  amiodarone  30 mg/hr (12/18/23 1000)   dextrose  5% lactated ringers  100 mL/hr at 12/18/23 1000   fentaNYL  infusion INTRAVENOUS 200 mcg/hr (12/18/23 1000)   linezolid  (ZYVOX ) IV 600 mg (12/18/23 1000)   norepinephrine  (LEVOPHED ) Adult infusion 1 mcg/min (12/18/23 1000)   piperacillin -tazobactam (ZOSYN )  IV Stopped (12/18/23 0945)   prismasol BGK 4/2.5 400 mL/hr at 12/18/23 0609   prismasol BGK 4/2.5 400 mL/hr at 12/18/23 0610   prismasol BGK 4/2.5 1,500  mL/hr at 12/18/23 0804   propofol  (DIPRIVAN ) infusion 20 mcg/kg/min (12/18/23 1000)   PRN Meds:.fentaNYL , heparin , mouth rinse, sodium chloride   Current Labs: reviewed    Physical Exam:  Blood pressure 105/67, pulse 85, temperature 98.2 F (36.8 C), resp. rate (!) 27, height 5' 5 (1.651 m), weight 103.5 kg, SpO2 95%. GEN: Intubated, sedated ENT: ET tube in place, NG tube in place CV: Regular, normal S1 and S2 PULM: Coarse breath sounds bilaterally ABD: Distended abdomen, grimaces to palpation. SKIN: no Rashes or lesions EXT: no LEE  A AKI, anuric, baseline normal GFR.  No evidence of obstruction on imaging at presentation.  Suspect ATN.  Repeat bladder pressure not consistent with abdominal compartment syndrome on 9/27. 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 tach at presentation, cardiology following on amiodarone  Septic shock on linezolid  and Zosyn , weaning off pressors Hyperphosphatemia setting of AKI, improving Metabolic acidosis, improving  P Continue CRRT with all 4K bath and current flow settings Begin fluid removal at 50 to 100 mL net negative per hour Daily weights, Daily Renal Panel, Strict I/Os, Avoid nephrotoxins (NSAIDs, judicious IV Contrast)  Medication Issues; Preferred narcotic agents for pain control are hydromorphone , fentanyl , and methadone. Morphine  should not be used.  Baclofen should be avoided Avoid oral sodium phosphate  and magnesium  citrate based laxatives / bowel preps  Bernardino Gasman MD 12/18/2023, 10:02 AM  Recent Labs  Lab 12/17/23 0333 12/17/23 0422 12/17/23 1037 12/17/23 1804 12/18/23 0336  NA 125*   < > 123* 126* 127*  126*  K 4.3   < > 4.4  4.5 4.1  4.1  CL 87*  --   --  88* 93*  91*  CO2 17*  --   --  18* 20*  20*  GLUCOSE 121*  --   --  117* 130*  129*  BUN 74*  --   --  86* 68*  68*  CREATININE 5.13*  --   --  5.83* 4.55*  4.39*  CALCIUM  7.0*  --   --  7.2* 7.0*  7.0*  PHOS 11.7*  --   --  10.2* 6.1*   < > = values in this interval not displayed.   Recent Labs  Lab 12/16/23 1524 12/16/23 1535 12/17/23 0333 12/17/23 0422 12/17/23 0908 12/17/23 1037 12/18/23 0336  WBC 6.0  --  4.8  --   --   --  9.8  HGB 12.6*   < > 12.1*   < > 11.9* 10.9* 10.6*  HCT 37.1*   < > 35.4*   < > 35.0* 32.0* 29.6*  MCV 79.8*  --  78.7*  --   --   --  76.7*  PLT 324  --  303  --   --   --  260   < > = values in this interval not displayed.

## 2023-12-18 NOTE — Plan of Care (Signed)
  Problem: Clinical Measurements: Goal: Ability to maintain clinical measurements within normal limits will improve Outcome: Progressing Goal: Will remain free from infection Outcome: Progressing Goal: Diagnostic test results will improve Outcome: Progressing Goal: Respiratory complications will improve Outcome: Progressing Goal: Cardiovascular complication will be avoided Outcome: Progressing   Problem: Activity: Goal: Risk for activity intolerance will decrease Outcome: Progressing   Problem: Nutrition: Goal: Adequate nutrition will be maintained Outcome: Progressing   Problem: Coping: Goal: Level of anxiety will decrease Outcome: Progressing   Problem: Elimination: Goal: Will not experience complications related to bowel motility Outcome: Progressing Goal: Will not experience complications related to urinary retention Outcome: Progressing   Problem: Pain Managment: Goal: General experience of comfort will improve and/or be controlled Outcome: Progressing   Problem: Safety: Goal: Ability to remain free from injury will improve Outcome: Progressing   Problem: Skin Integrity: Goal: Risk for impaired skin integrity will decrease Outcome: Progressing   Problem: Activity: Goal: Ability to tolerate increased activity will improve Outcome: Progressing   Problem: Respiratory: Goal: Ability to maintain a clear airway and adequate ventilation will improve Outcome: Progressing   Problem: Role Relationship: Goal: Method of communication will improve Outcome: Progressing   Problem: Fluid Volume: Goal: Ability to maintain a balanced intake and output will improve Outcome: Progressing   Problem: Metabolic: Goal: Ability to maintain appropriate glucose levels will improve Outcome: Progressing   Problem: Nutritional: Goal: Maintenance of adequate nutrition will improve Outcome: Progressing Goal: Progress toward achieving an optimal weight will improve Outcome:  Progressing   Problem: Skin Integrity: Goal: Risk for impaired skin integrity will decrease Outcome: Progressing   Problem: Tissue Perfusion: Goal: Adequacy of tissue perfusion will improve Outcome: Progressing     Problem: Education: Goal: Knowledge of General Education information will improve Description: Including pain rating scale, medication(s)/side effects and non-pharmacologic comfort measures Outcome: Not Progressing   Problem: Health Behavior/Discharge Planning: Goal: Ability to manage health-related needs will improve Outcome: Not Progressing   Problem: Coping: Goal: Ability to adjust to condition or change in health will improve Outcome: Not Progressing

## 2023-12-18 NOTE — Progress Notes (Signed)
 Gastric residual after 6 hours of trickle tube feeds was 60mL. Per order, continue tube feeds and re-check in 6 hours or PRN.

## 2023-12-18 NOTE — Progress Notes (Signed)
 Progress Note  Patient Name: Glenn Stewart Date of Encounter: 12/18/2023  Primary Cardiologist: Redell Shallow, MD   Subjective   Intubated, sedated  Inpatient Medications    Scheduled Meds:  amiodarone   150 mg Intravenous Once   bisacodyl   10 mg Rectal Daily   Chlorhexidine  Gluconate Cloth  6 each Topical Daily   docusate  100 mg Per Tube BID   heparin  injection (subcutaneous)  5,000 Units Subcutaneous Q8H   insulin  aspart  0-15 Units Subcutaneous Q4H   mouth rinse  15 mL Mouth Rinse Q2H   pantoprazole  (PROTONIX ) IV  40 mg Intravenous Daily   polyethylene glycol  17 g Per Tube Daily   sodium chloride  flush  5 mL Intracatheter Q8H   Continuous Infusions:  amiodarone  30 mg/hr (12/18/23 0700)   dextrose  5% lactated ringers  100 mL/hr at 12/18/23 0700   fentaNYL  infusion INTRAVENOUS 175 mcg/hr (12/18/23 0700)   linezolid  (ZYVOX ) IV Stopped (12/17/23 2302)   norepinephrine  (LEVOPHED ) Adult infusion 2 mcg/min (12/18/23 0700)   piperacillin -tazobactam (ZOSYN )  IV Stopped (12/18/23 0435)   prismasol BGK 4/2.5 400 mL/hr at 12/18/23 0609   prismasol BGK 4/2.5 400 mL/hr at 12/18/23 0610   prismasol BGK 4/2.5 1,500 mL/hr at 12/18/23 0440   propofol  (DIPRIVAN ) infusion 20 mcg/kg/min (12/18/23 0700)   PRN Meds: fentaNYL , heparin , mouth rinse, sodium chloride    Vital Signs    Vitals:   12/18/23 0600 12/18/23 0615 12/18/23 0630 12/18/23 0645  BP:      Pulse: 85 84 84 83  Resp: (!) 28 (!) 28 (!) 28 (!) 30  Temp: 98.4 F (36.9 C) 98.4 F (36.9 C) 98.4 F (36.9 C) 98.4 F (36.9 C)  TempSrc:      SpO2: 96% 96% 96% 96%  Weight:      Height:        Intake/Output Summary (Last 24 hours) at 12/18/2023 0713 Last data filed at 12/18/2023 0700 Gross per 24 hour  Intake 5115.53 ml  Output 3464 ml  Net 1651.53 ml   Filed Weights   12/16/23 1907 12/17/23 0414 12/18/23 0124  Weight: 103.8 kg 103.8 kg 103.5 kg    Telemetry    Sinus rhythm. One brief (3-4 beat) run of NSVT;  brief atrial run, non-sustained  - Personally Reviewed  ECG    No recent ECG available - Personally Reviewed  Physical Exam  GEN: No acute distress. Intubated, sedated Cardiac: RRR, no murmurs, rubs, or gallops.  Respiratory: ventilated. No appreciable rales, rhonchi anteriorly   Labs    Chemistry Recent Labs  Lab 12/16/23 1524 12/16/23 1535 12/17/23 0333 12/17/23 0422 12/17/23 1037 12/17/23 1804 12/18/23 0336  NA 129*   < > 125*   < > 123* 126* 127*  126*  K 2.8*   < > 4.3   < > 4.4 4.5 4.1  4.1  CL 96*   < > 87*  --   --  88* 93*  91*  CO2 16*   < > 17*  --   --  18* 20*  20*  GLUCOSE 118*   < > 121*  --   --  117* 130*  129*  BUN 55*   < > 74*  --   --  86* 68*  68*  CREATININE 3.92*   < > 5.13*  --   --  5.83* 4.55*  4.39*  CALCIUM  6.3*   < > 7.0*  --   --  7.2* 7.0*  7.0*  PROT 5.2*  --  5.6*  --   --   --  5.4*  ALBUMIN  1.6*  --  1.5*  --   --  <1.5* <1.5*  <1.5*  AST 68*  --  83*  --   --   --  85*  ALT 37  --  43  --   --   --  41  ALKPHOS 69  --  85  --   --   --  105  BILITOT 3.4*  --  3.9*  --   --   --  3.1*  GFRNONAA 17*   < > 12*  --   --  10* 14*  15*  ANIONGAP 17*   < > 21*  --   --  20* 14  15   < > = values in this interval not displayed.     Hematology Recent Labs  Lab 12/16/23 1524 12/16/23 1535 12/17/23 0333 12/17/23 0422 12/17/23 0908 12/17/23 1037 12/18/23 0336  WBC 6.0  --  4.8  --   --   --  9.8  RBC 4.65  --  4.50  --   --   --  3.86*  HGB 12.6*   < > 12.1*   < > 11.9* 10.9* 10.6*  HCT 37.1*   < > 35.4*   < > 35.0* 32.0* 29.6*  MCV 79.8*  --  78.7*  --   --   --  76.7*  MCH 27.1  --  26.9  --   --   --  27.5  MCHC 34.0  --  34.2  --   --   --  35.8  RDW 13.7  --  14.1  --   --   --  14.6  PLT 324  --  303  --   --   --  260   < > = values in this interval not displayed.    Cardiac EnzymesNo results for input(s): TROPONINI in the last 168 hours. No results for input(s): TROPIPOC in the last 168 hours.   BNPNo  results for input(s): BNP, PROBNP in the last 168 hours.   DDimer No results for input(s): DDIMER in the last 168 hours.   Summary of Pertinent studies    TTE 12/17/2023: Normal EF   Imaging: Chest xray, CT scans reviewed   ECG 9/27 - reviewed - sinus rhythm Cardiac cath 04/2022 -- reviewed severe three vessel disease   Patient Profile     58 y.o. male with history of CAD and CABG, paroxysmal atrial fibrillation who is being seen 12/16/2023 for the evaluation of ventricular tachycardia and atrial fibrillation at the request of Dr. Cottie.   Assessment & Plan    VT Hemodynamically unstable wide-complex tachycardia on presentation In setting of acute, severe illness and electrolyte dysarray Normal EF Ok to continue amiodarone  for now, eg until he is extubated -- it can be discontinued with little risk if needed  Atrial fibrillation History of post-op AF No AF on telemetry  Shock, abdominal abscess Per primary, surgical teams On levophed  S/pr IR drainage of abscess  AKI Cr increasing, minimal UOP   CAD S/p CABG Feb 2024 Low suspicion for ACS this presentation    For questions or updates, please contact [facility] Please consult [email or web address] for contact info under Cardiology/STEMI.      Signed, Eulas FORBES Furbish, MD 12/18/2023, 7:13 AM

## 2023-12-18 NOTE — Progress Notes (Signed)
 Subjective: Glenn Stewart remains intubated and oliguric on CRRT. S/p VIR drain placement with >1L output.   Update in HPI: Upon additional review of [facility]ro[facility] and discussion with colleagues who had seen him as outpatient, patient self-irrigated catheter a couple days after initial surgery and then removed catheter himself prior to follow-up at 1 week. Patient reported that he cut tubing prior to removal but unable to confirm if removal was truly atraumatic. At initial postop visits, he otherwise did not complain of pain, fever/chills, or abdominal distention. Was having regular BMs at that time.   Anti-infectives: Anti-infectives (From admission, onward)    Start     Dose/Rate Route Frequency Ordered Stop   12/17/23 1600  piperacillin -tazobactam (ZOSYN ) IVPB 2.25 g  Status:  Discontinued        2.25 g 100 mL/hr over 30 Minutes Intravenous Every 8 hours 12/17/23 0931 12/17/23 1144   12/17/23 1600  piperacillin -tazobactam (ZOSYN ) IVPB 3.375 g        3.375 g 100 mL/hr over 30 Minutes Intravenous Every 6 hours 12/17/23 1157     12/17/23 1230  piperacillin -tazobactam (ZOSYN ) IVPB 3.375 g  Status:  Discontinued        3.375 g 100 mL/hr over 30 Minutes Intravenous Every 6 hours 12/17/23 1144 12/17/23 1157   12/17/23 1000  linezolid  (ZYVOX ) IVPB 600 mg        600 mg 300 mL/hr over 60 Minutes Intravenous Every 12 hours 12/16/23 1924     12/17/23 0000  piperacillin -tazobactam (ZOSYN ) IVPB 3.375 g  Status:  Discontinued        3.375 g 12.5 mL/hr over 240 Minutes Intravenous Every 8 hours 12/16/23 1924 12/17/23 0931   12/16/23 1600  piperacillin -tazobactam (ZOSYN ) IVPB 3.375 g        3.375 g 12.5 mL/hr over 240 Minutes Intravenous  Once 12/16/23 1551 12/16/23 1602   12/16/23 1600  vancomycin  (VANCOREADY) IVPB 1750 mg/350 mL        1,750 mg 175 mL/hr over 120 Minutes Intravenous  Once 12/16/23 1551 12/16/23 1816       Current Facility-Administered Medications  Medication Dose Route Frequency  Provider Last Rate Last Admin   amiodarone  (NEXTERONE ) 1.8 mg/mL load via infusion 150 mg  150 mg Intravenous Once Trifan, Matthew J, MD       Followed by   amiodarone  (NEXTERONE  PREMIX) 360-4.14 MG/200ML-% (1.8 mg/mL) IV infusion  30 mg/hr Intravenous Continuous Cottie Donnice PARAS, MD 16.67 mL/hr at 12/18/23 0600 30 mg/hr at 12/18/23 0600   bisacodyl  (DULCOLAX) suppository 10 mg  10 mg Rectal Daily Sheldon Standing, MD   10 mg at 12/17/23 9145   Chlorhexidine  Gluconate Cloth 2 % PADS 6 each  6 each Topical Daily Gatha Pence, MD   6 each at 12/17/23 2224   dextrose  5 % in lactated ringers  infusion   Intravenous Continuous Geronimo Amel, MD 100 mL/hr at 12/18/23 0600 Infusion Verify at 12/18/23 0600   docusate (COLACE) 50 MG/5ML liquid 100 mg  100 mg Per Tube BID Simpson, Paula B, NP   100 mg at 12/17/23 2234   fentaNYL  (SUBLIMAZE ) bolus via infusion 25-100 mcg  25-100 mcg Intravenous Q15 min PRN Cottie Donnice PARAS, MD   100 mcg at 12/17/23 2207   fentaNYL  in NS (70mcg/ml) infusion-PREMIX  0-400 mcg/hr Intravenous Continuous Cottie Donnice PARAS, MD 17.5 mL/hr at 12/18/23 0600 175 mcg/hr at 12/18/23 0600   heparin  injection 1,000-6,000 Units  1,000-6,000 Units CRRT PRN Marlee Bernardino NOVAK, MD  heparin  injection 5,000 Units  5,000 Units Subcutaneous Q8H Geronimo Amel, MD   5,000 Units at 12/18/23 0513   insulin  aspart (novoLOG ) injection 0-15 Units  0-15 Units Subcutaneous Q4H Geronimo Amel, MD   2 Units at 12/18/23 0341   linezolid  (ZYVOX ) IVPB 600 mg  600 mg Intravenous Q12H Gatha Pence, MD   Stopped at 12/17/23 2302   norepinephrine  (LEVOPHED ) 16 mg in (0.064 mg/mL) premix infusion  0-40 mcg/min Intravenous Titrated Gatha Pence, MD 1.88 mL/hr at 12/18/23 0600 2 mcg/min at 12/18/23 0600   Oral care mouth rinse  15 mL Mouth Rinse Q2H Mohammed, Shahid, MD   15 mL at 12/18/23 0545   Oral care mouth rinse  15 mL Mouth Rinse PRN Gatha Pence, MD        pantoprazole  (PROTONIX ) injection 40 mg  40 mg Intravenous Daily Antonetta Moccasin B, NP   40 mg at 12/17/23 1012   piperacillin -tazobactam (ZOSYN ) IVPB 3.375 g  3.375 g Intravenous Q6H Geronimo Amel, MD   Stopped at 12/18/23 0435   polyethylene glycol (MIRALAX  / GLYCOLAX ) packet 17 g  17 g Per Tube Daily Antonetta Moccasin B, NP   17 g at 12/16/23 2050   prismasol BGK 4/2.5 infusion   CRRT Continuous Marlee Motto B, MD 400 mL/hr at 12/18/23 0609 New Bag at 12/18/23 0609   prismasol BGK 4/2.5 infusion   CRRT Continuous Marlee Motto NOVAK, MD 400 mL/hr at 12/18/23 0610 New Bag at 12/18/23 0610   prismasol BGK 4/2.5 infusion   CRRT Continuous Marlee Motto NOVAK, MD 1,500 mL/hr at 12/18/23 0440 New Bag at 12/18/23 0440   propofol  (DIPRIVAN ) 1000 MG/100ML infusion  0-80 mcg/kg/min Intravenous Continuous Antonetta Moccasin B, NP 15.57 mL/hr at 12/18/23 0600 25 mcg/kg/min at 12/18/23 0600   sodium chloride  0.9 % primer fluid for CRRT   CRRT PRN Marlee Motto NOVAK, MD   Given at 12/17/23 1909   sodium chloride  flush (NS) 0.9 % injection 5 mL  5 mL Intracatheter Q8H Mugweru, Jon, MD   5 mL at 12/18/23 0517     Objective: Vital signs in last 24 hours: Temp:  [96.8 F (36 C)-99.3 F (37.4 C)] 98.4 F (36.9 C) (09/28 0600) Pulse Rate:  [73-113] 85 (09/28 0600) Resp:  [19-32] 28 (09/28 0600) BP: (96-118)/(68-78) 96/69 (09/28 0000) SpO2:  [92 %-100 %] 96 % (09/28 0600) Arterial Line BP: (77-129)/(45-72) 104/55 (09/28 0600) FiO2 (%):  [50 %-100 %] 50 % (09/28 0400) Weight:  [103.5 kg] 103.5 kg (09/28 0124)  Intake/Output from previous day: 09/27 0701 - 09/28 0700 In: 4964.5 [I.V.:3050.6; NG/GT:60; IV Piggyback:1848.9] Out: 3336 [Urine:60; Emesis/NG output:200; Drains:1125] Intake/Output this shift: Total I/O In: 2161.3 [I.V.:1701.3; NG/GT:60; IV Piggyback:400] Out: 2381 [Urine:25; Emesis/NG output:200; Drains:205]  Physical Exam General: intubated and somewhat sedated, agitated with touch  Resp:  intubated CV: tachycardic GI: abdomen significantly distended and tender to palpation. Drain with serous output.  GU: Foley in place with scant output  MSK: BL extremities swollen   Lab Results:  Recent Labs    12/17/23 0333 12/17/23 0422 12/17/23 1037 12/18/23 0336  WBC 4.8  --   --  9.8  HGB 12.1*   < > 10.9* 10.6*  HCT 35.4*   < > 32.0* 29.6*  PLT 303  --   --  260   < > = values in this interval not displayed.   BMET Recent Labs    12/17/23 1804 12/18/23 0336  NA 126* 127*  126*  K  4.5 4.1  4.1  CL 88* 93*  91*  CO2 18* 20*  20*  GLUCOSE 117* 130*  129*  BUN 86* 68*  68*  CREATININE 5.83* 4.55*  4.39*  CALCIUM  7.2* 7.0*  7.0*   PT/INR Recent Labs    12/16/23 1524  LABPROT 16.3*  INR 1.2   ABG Recent Labs    12/17/23 0908 12/17/23 1037  PHART 7.346* 7.349*  HCO3 20.1 18.8*    Studies/Results:  CT Cystogram 9/27   Assessment and Plan: 36M with hx of CAD and CABG x5, Afib (not on AC), BL carotid stenosis, HTN, HLD s/p RALP on 8/28 presented with intra-abdominal fluid collections and CT cystogram showing small leak at anastomosis.   #SBO with intraabdominal fluid collection/abscess.   CT cystogram shows only a small contained leak at the anterior anastamosis S/p IR drainage of the fluid collection on 9/27 with aspiration of 30 mL mL of turbid fluid per their op note  Cr from fluid level 4.9, c/w serum level AC with +WBCs and GPC   #Prostate cancer s/p robotic prostatectomy.  Small contained   anastomotic leak.  Leave foley for now.  -Continue Foley catheter to drainage    LOS: 2 days    Lyle LOISE Civil 12/18/2023

## 2023-12-18 NOTE — Progress Notes (Signed)
 Gastric residuals after 6 hrs of trickle feeds was 70 mL's. Will continue with trickle feeds and re-check in 6 hrs/PRN.

## 2023-12-18 NOTE — Progress Notes (Signed)
 NAME:  Glenn Stewart, MRN:  [id number], DOB:  [birth year], LOS: 2 ADMISSION DATE:  12/16/2023, CONSULTATION DATE:  12/16/23 REFERRING MD:  Dr. Cottie, CHIEF COMPLAINT:  weakness   bRIEF  HPI obtained from EMR as pt is intubated and sedated.   78 yoM with PMH of HTN, CAD, CABG x 5 04/2022 with post op Afib since stopped on Eliquis  and amiodarone  09/2022, HTN, HLD, GERD, NASH, bipolar/ ADHD and s/p recent robotic assisted laparoscopic radial prostatectomy on 11/17/23 who presented for weakness and waxing mental status with distended and tender abdominal by EMS found with episodes of Afib with RVR and VT.  Was hemodynamically unstable on arrival, underwent emergent cardioversion and given amiodarone  x2 and magnesium , with emerging rhythm of afib with RVR possible flutter.  Cardiology consulted. Post cardioversion became hypotensive with respiratory distress therefore intubated and placed on norepinephrine .  Difficulty oxygenating post intubation with normal PIP requiring 1.0 and 12 PEEP.  Labs noted for K2.8, Na 129, bicarb 16, BUN/ sCr 55/ 3.92 (recently  ), t. Bili 3.4, AST 68, albumin  1.6, protein 3.4, INR 1.2.  CTH neg, CTA PE and CT a/p w/contrast pending official read.  2.1L of brownish OGT contents s/p OGT placement. Cultures sent, empiric zosyn / vanc started.  Afebrile, anuric and since weaned off pressors.  R internal jugular CVL placed by EDP.  PCCM consulted for admission.     NOTE:LD him as outpatient, patient self-irrigated catheter a couple days after initial surgery and then removed catheter himself prior to follow-up at 1 week. Patient reported that he cut tubing prior to removal but unable to confirm if removal was truly atraumat   Pertinent  Medical History    has a past medical history of ADHD (attention deficit hyperactivity disorder), Anxiety state, unspecified, Bipolar 1 disorder (HCC), Cancer (HCC), CHEST PAIN (12/04/2008), Coronary artery disease, Depressive disorder, not elsewhere  classified, Dermatophytosis of nail, Esophageal reflux, History of kidney stones, HYPERGLYCEMIA (07/10/2007), Low back pain (09/18/2013), Other chronic nonalcoholic liver disease, Pre-diabetes, Problems with hearing, Prostatitis, unspecified, Pure hypercholesterolemia, Screen for STD (sexually transmitted disease) (04/20/2011), Sprain of cruciate ligament of knee, Tear of medial cartilage or meniscus of knee, current, TMJ (temporomandibular joint disorder) (12/25/2010), Unspecified essential hypertension, Unspecified gastritis and gastroduodenitis without mention of hemorrhage, and Urethritis, unspecified.   has a past surgical history that includes No prior surgery; LEFT HEART CATH AND CORONARY ANGIOGRAPHY (N/A, 05/10/2022); Coronary artery bypass graft (N/A, 05/12/2022); Radial artery harvest (Left, 05/12/2022); TEE without cardioversion (N/A, 05/12/2022); Cystoscopy w/ litholapaxy / EHL; and Robot assisted laparoscopic radical prostatectomy (N/A, 11/17/2023).   Significant Hospital Events: Including procedures, antibiotic start and stop dates in addition to other pertinent events   9/26> admit\   9/27: 90% fio2 on ventilator. On diprivan  gtt, fent gtt, amio gtt. CVP 9.  AFebrile. Cutlure negative so far. cReat 5.13 and climbing (Baseline 0.91 on 8/292/25). AKI started 12/16/23. Normal lactate byu Ionized calcim is low. Seen by urology  & CCS- recommending  IR attemt of intrabdominal abscess that is in RLQ and tracks down bladder..  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 CCS does not think there is appendicit or diveritculits. And that SBO/Colitis is secondary to urinoma. Off levop and followed commands. aNURIC - 100cc only since admit CT   -  IMPRESSION: . There is focal irregularity at the junction of the bladder base and urethra with small anterior contrast pooling suggesting contained perforation. No intraperitoneal contrast extravasation. Appearance examination is otherwise unchanged.  Again demonstrated  re multiple loculated fluid collections in the abdomen and pelvis onsistent with abscesses. Wall thickening of the cecum and terminal ileum. Fluid-filled dilated small bowel appear mildly improved.  Consolidation or atelectasis in both lung bases, possibly pneumonia, compressive atelectasis, or aspiration. Small bilateral pleural effusions  . Wall thickening of the ascending colon and hepatic flexure, new since prior study. This may indicate colitis or inflammatory bowel disease versus reactive change. Dilated fluid-filled small bowel with air-fluid levels and mild diffuse wall thickening. Terminal ileum is decompressed. This is consistent with small-bowel obstruction, possibly due to inflammatory strictures. IR Procedure: DRAINAGE CATHETER PLACEMENT into a RIGHT LOWER QUADRANT FLUID COLLECTION ->> 1L drained HD CATH Placed CRRT started     Interim History / Subjective:    9/28 - On vent. Ongoing CRRT. Seen by cCS  - still concernd about Urinoma and secondary blowe irritation and not a true obstruction NGT output is low c/w ileus. CCS does  NOT want enteral medication. Recommnding max only trophic feds. CUlture negative. AFfebile.    On vent 40% On levoped 2mcg OPn fent gtt On diprivan  gtt On amio gtt Trophic feeds started Foley ongoing CRRTT onging  Objective    Blood pressure 105/67, pulse 85, temperature 98.2 F (36.8 C), resp. rate (!) 28, height 5' 5 (1.651 m), weight 103.5 kg, SpO2 94%. CVP:  [12 mmHg-16 mmHg] 12 mmHg  Vent Mode: PRVC FiO2 (%):  [40 %-100 %] 40 % Set Rate:  [28 bmp] 28 bmp Vt Set:  [490 mL] 490 mL PEEP:  [5 cmH20-12 cmH20] 5 cmH20 Plateau Pressure:  [13 cmH20-20 cmH20] 15 cmH20   Intake/Output Summary (Last 24 hours) at 12/18/2023 0913 Last data filed at 12/18/2023 0900 Gross per 24 hour  Intake 5388.58 ml  Output 3839 ml  Net 1549.58 ml   Filed Weights   12/16/23 1907 12/17/23 0414 12/18/23 0124  Weight: 103.8 kg 103.8 kg 103.5  kg    Examination:  General Appearance:  Looks criticall ill OBESE - _ Head:  Normocephalic, without obvious abnormality, atraumatic Eyes:  PERRL - yes, conjunctiva/corneas - mudyu     Ears:  Normal external ear canals, both ears Nose:  G tube - no Throat:  ETT TUBE - yes , OG tube - yes Neck:  Supple,  No enlargement/tenderness/nodules Lungs: Clear to auscultation bilaterally, Ventilator   Synchrony - yes 40% fio2 Heart:  S1 and S2 normal, no murmur, CVP - no.  Pressors - levophed  2cmcg Abdomen:  Soft, no masses, no organomegaly Genitalia / Rectal:  Not done Extremities:  Extremities- intact Skin:  ntact in exposed areas . Sacral area - not examined Neurologic:  Sedation - diprivan  and ffent gtt -> RASS - -2 . Moves all 4s - yes. CAM-ICU - cannot test . Orientation - able to track and move all 4s    Assessment and Plan   Prostate cancer s/p robotic assisted laparoscopic radial prostatectomy on 11/17/23 Shock, likely septic,   12/18/2023 - on levophed  low dose   P:  MAP goal > 65; monitor if levophed  needs increase as CRRT pulls off more volume DC aline  Aat admit due to Urinoma with INtrabd abscess and secondary COlitis with SBPO - - IR consult for Intrabd abscess drain -> Dw/ Dr Delcia  PLAn  - Urology consult - notes revoiewd - CCS - reviewed DR Gross notes - Await drain cultures   Acute hypoxic and hypercarbic respiratory failure, suspected early aspiration of BLL PNA/ pneumonitis concerning for developing early ARDS/ALI - -  CTA PE neg for acute PE  12/18/2023 - > down to 40% fio2, peep 5. Does not meet SBT criteria due to  P:  PRVC VAP bundle Peep/Fio2 as needed for pulse ox > 88%  ABG as needed  Afib with RVR  VT, monomorphic Elevated troponin- suspected demand  Hx CAD, prior CABG x 5 04/2022, and post op Afib (off amio/ eliquis  09/2022), HTN, HLD - s/p emergent cardioversion, amiodarone , mag - Admiot echo ef 55% without wall motion abnormalities  9/27  and 9.28: in Sinus. REmain on Amio gtt  PLAN - appreciate cardiology input - suspected in setting of multiple electrolyte abnormalities and critical illness - optimize electrolytes/ replete prn> K, calcium  gluconate now.  Trend on labs - cont amiodarone  gtt -  - eper EP 9/29 - ok to contginue amio gtt for now - defer heparin  gtt for now pending surgery eval   AKI with meabolic acidosis  - no hydronephrosis on CT, bladder decompressed . AKI worse 9/27 with ANURIA. On CRRT since 12/17/23  P:  Per RENAL - crrt  ANEMIA of critical illness  9/28: Mild and stable  Planm - - PRBC for hgb </= 6.9gm%    - exceptions are   -  if ACS susepcted/confirmed then transfuse for hgb </= 8.0gm%,  or    -  active bleeding with hemodynamic instability, then transfuse regardless of hemoglobin value   At at all times try to transfuse 1 unit prbc as possible with exception of active hemorrhage    Elevated t. Bili with transaminitis - suspected in setting of sepsis, trend on labs   Hyponattermia  Plan  - monitor    Best practice:  Diet: NPO - TRophic efeeds since 12/18/23 Pain/Anxiety/Delirium protocol (if indicated): Diprivan  gtt and Fent gtt VAP protocol (if indicated): YES DVT prophylaxis: heparin  5000 tid 12/17/23 GI prophylaxis: protinix Glucose control: ssi Mobility: bed rest Code Status: full code DISPOSITION:L Keep in ICU 12/18/23  Family Communication:   - Called Mom Rock Spanner [phone] - she said she is 2 and deferred that I called daughter but did inform her that he is on life support  - Called Sister Lucian Pond [phone]- called and  updated 9:22 AM 12/17/2023 (she is in [place] and planning to come). She 0  - 12/18/23:   Called  sister Lucian Pond - and updated      ATTESTATION & SIGNATURE   The patient GRAIDEN HENES is critically ill with multiple organ systems failure and requires high complexity decision making for assessment and support, frequent  evaluation and titration of therapies, application of advanced monitoring technologies and extensive interpretation of multiple databases and discussion with other appropriate health care personnel such as bedside nurses, social workers, case Production designer, theatre/television/film, consultants, respiratory therapists, nutritionists, secretaries etc.,  Critical care time includes but is not restricted to just documentation time. Documentation can happen in parallel or sequential to care time depending on case mix urgency and priorities for the shift. So, overall critical Care Time devoted to patient care services described in this note is  30  Minutes.   This time reflects time of care of this signee Dr Dorethia Cave which includ does not reflect procedure time, or teaching time or supervisory time of PA/NP/Med student/Med Resident etc but could involve care discussion time     Dr. Dorethia Cave, M.D., [facility].C.P Pulmonary and Critical Care Medicine Staff Physician, [facility] [facility] Pager: [phone]  [phone], If no answer or between  15:00h - 7:00h: call [phone]  12/18/2023 9:35 AM    LABS    PULMONARY Recent Labs  Lab 12/16/23 1710 12/16/23 2124 12/17/23 0422 12/17/23 0908 12/17/23 1002 12/17/23 1037  PHART 7.219* 7.301* 7.292* 7.346*  --  7.349*  PCO2ART 54.3* 41.3 42.5 36.8  --  34.2  PO2ART 74* 102 95 116*  --  91  HCO3 22.3 20.4 20.6 20.1  --  18.8*  TCO2 24 22 22  21*  --  20*  O2SAT 92 97 97 98 77.7 97    CBC Recent Labs  Lab 12/16/23 1524 12/16/23 1535 12/17/23 0333 12/17/23 0422 12/17/23 0908 12/17/23 1037 12/18/23 0336  HGB 12.6*   < > 12.1*   < > 11.9* 10.9* 10.6*  HCT 37.1*   < > 35.4*   < > 35.0* 32.0* 29.6*  WBC 6.0  --  4.8  --   --   --  9.8  PLT 324  --  303  --   --   --  260   < > = values in this interval not displayed.    COAGULATION Recent Labs  Lab 12/16/23 1524  INR 1.2    CARDIAC  No results for input(s): TROPONINI in  the last 168 hours. No results for input(s): PROBNP in the last 168 hours.   CHEMISTRY Recent Labs  Lab 12/16/23 1524 12/16/23 1535 12/16/23 1710 12/16/23 2023 12/16/23 2124 12/17/23 0333 12/17/23 0422 12/17/23 0908 12/17/23 1037 12/17/23 1804 12/18/23 0336  NA 129* 130*   < > 124*   < > 125* 123* 123* 123* 126* 127*  126*  K 2.8* 2.8*   < > 3.7   < > 4.3 4.5 4.5 4.4 4.5 4.1  4.1  CL 96* 96*  --  89*  --  87*  --   --   --  88* 93*  91*  CO2 16*  --   --  19*  --  17*  --   --   --  18* 20*  20*  GLUCOSE 118* 114*  --  137*  --  121*  --   --   --  117* 130*  129*  BUN 55* 50*  --  66*  --  74*  --   --   --  86* 68*  68*  CREATININE 3.92* 4.10*  --  4.45*  --  5.13*  --   --   --  5.83* 4.55*  4.39*  CALCIUM  6.3*  --   --  6.9*  --  7.0*  --   --   --  7.2* 7.0*  7.0*  MG 2.3  --   --   --   --  2.9*  --   --   --   --  2.8*  PHOS 7.3*  --   --   --   --  11.7*  --   --   --  10.2* 6.1*   < > = values in this interval not displayed.   Estimated Creatinine Clearance: 19.4 mL/min (A) (by C-G formula based on SCr of 4.55 mg/dL (H)).   LIVER Recent Labs  Lab 12/16/23 1524 12/17/23 0333 12/17/23 1804 12/18/23 0336  AST 68* 83*  --  85*  ALT 37 43  --  41  ALKPHOS 69 85  --  105  BILITOT 3.4* 3.9*  --  3.1*  PROT 5.2* 5.6*  --  5.4*  ALBUMIN  1.6* 1.5* <1.5* <1.5*  <1.5*  INR 1.2  --   --   --      INFECTIOUS Recent Labs  Lab 12/16/23 1600 12/16/23 1839 12/16/23 2023  LATICACIDVEN 3.5* 1.2 1.3     ENDOCRINE CBG (last 3)  Recent Labs    12/17/23 2351 12/18/23 0337 12/18/23 0716  GLUCAP 144* 132* 127*         IMAGING x48h  - image(s) personally visualized  -   highlighted in bold ECHOCARDIOGRAM COMPLETE Result Date: 12/18/2023    ECHOCARDIOGRAM REPORT   Patient Name:   LAVI SHEEHAN Date of Exam: 12/17/2023 Medical Rec #:  [id number]        Height:       65.0 in Accession #:    [id number]       Weight:       228.8 lb Date of Birth:   [birth year]         BSA:          2.095 m Patient Age:    58 years         BP:           99/69 mmHg Patient Gender: M                HR:           95 bpm. Exam Location:  Inpatient Procedure: 2D Echo, Cardiac Doppler and Color Doppler (Both Spectral and Color            Flow Doppler were utilized during procedure). Indications:    Shock R57.9  History:        Patient has prior history of Echocardiogram examinations, most                 recent 05/10/2022. CAD and Angina, Prior CABG,                 Signs/Symptoms:Chest Pain; Risk Factors:Hypertension and                 Dyslipidemia.  Sonographer:    Thea Norlander RCS Referring Phys: VINA NOVAK SIMPSON IMPRESSIONS  1. Left ventricular ejection fraction, by estimation, is 50 to 55%. The left ventricle has low normal function. The left ventricle has no regional wall motion abnormalities. Left ventricular diastolic parameters were normal.  2. Right ventricular systolic function is normal. The right ventricular size is mildly enlarged. Tricuspid regurgitation signal is inadequate for assessing PA pressure.  3. The mitral valve is normal in structure. No evidence of mitral valve regurgitation. No evidence of mitral stenosis.  4. The aortic valve is normal in structure. Aortic valve regurgitation is not visualized. No aortic stenosis is present.  5. The inferior vena cava is normal in size with greater than 50% respiratory variability, suggesting right atrial pressure of 3 mmHg. FINDINGS  Left Ventricle: Left ventricular ejection fraction, by estimation, is 50 to 55%. The left ventricle has low normal function. The left ventricle has no regional wall motion abnormalities. The left ventricular internal cavity size was normal in size. There is no left ventricular hypertrophy. Left ventricular diastolic parameters were normal. Right Ventricle: The right ventricular size is mildly enlarged. No increase in right ventricular wall thickness. Right ventricular systolic function is  normal. Tricuspid regurgitation signal is inadequate for assessing PA pressure. Left Atrium: Left atrial size was normal in size. Right Atrium: Right atrial size was normal in size. Pericardium: There is no evidence of pericardial effusion.  Mitral Valve: The mitral valve is normal in structure. No evidence of mitral valve regurgitation. No evidence of mitral valve stenosis. Tricuspid Valve: The tricuspid valve is normal in structure. Tricuspid valve regurgitation is trivial. No evidence of tricuspid stenosis. Aortic Valve: The aortic valve is normal in structure. Aortic valve regurgitation is not visualized. No aortic stenosis is present. Aortic valve peak gradient measures 7.8 mmHg. Pulmonic Valve: The pulmonic valve was normal in structure. Pulmonic valve regurgitation is not visualized. No evidence of pulmonic stenosis. Aorta: The aortic root is normal in size and structure. Venous: The inferior vena cava is normal in size with greater than 50% respiratory variability, suggesting right atrial pressure of 3 mmHg. IAS/Shunts: No atrial level shunt detected by color flow Doppler.  LEFT VENTRICLE PLAX 2D LVIDd:         4.40 cm   Diastology LVIDs:         3.20 cm   LV e' medial:    8.92 cm/s LV PW:         1.10 cm   LV E/e' medial:  10.5 LV IVS:        1.10 cm   LV e' lateral:   20.40 cm/s LVOT diam:     2.40 cm   LV E/e' lateral: 4.6 LV SV:         73 LV SV Index:   35 LVOT Area:     4.52 cm  RIGHT VENTRICLE             IVC RV S prime:     10.70 cm/s  IVC diam: 2.00 cm TAPSE (M-mode): 1.7 cm LEFT ATRIUM             Index        RIGHT ATRIUM           Index LA diam:        4.00 cm 1.91 cm/m   RA Area:     14.80 cm LA Vol (A2C):   33.1 ml 15.80 ml/m  RA Volume:   29.80 ml  14.23 ml/m LA Vol (A4C):   28.4 ml 13.56 ml/m LA Biplane Vol: 31.2 ml 14.89 ml/m  AORTIC VALVE AV Area (Vmax): 3.36 cm AV Vmax:        140.00 cm/s AV Peak Grad:   7.8 mmHg LVOT Vmax:      104.00 cm/s LVOT Vmean:     70.200 cm/s LVOT VTI:        0.161 m  AORTA Ao Root diam: 3.50 cm Ao Asc diam:  3.40 cm MITRAL VALVE MV Area (PHT): 4.21 cm    SHUNTS MV Decel Time: 180 msec    Systemic VTI:  0.16 m MV E velocity: 93.30 cm/s  Systemic Diam: 2.40 cm MV A velocity: 72.80 cm/s MV E/A ratio:  1.28 Oneil Parchment MD Electronically signed by Oneil Parchment MD Signature Date/Time: 12/18/2023/7:04:53 AM    Final    DG CHEST PORT 1 VIEW Result Date: 12/17/2023 CLINICAL DATA:  Central line placement EXAM: PORTABLE CHEST 1 VIEW COMPARISON:  12/17/2023 FINDINGS: Introduction of a LEFT central venous line with tip in the brachycephalic vein. No pneumothorax Endotracheal tube, NG tube, and RIGHT central venous line are unchanged. Midline sternotomy.  Low lung volumes.  LEFT effusion. IMPRESSION: 1. LEFT central venous line with tip in the brachiocephalic vein. No pneumothorax. 2. Additional support apparatus unchanged. 3. Low lung volumes and LEFT effusion. Electronically Signed   By: Jackquline Malva HERO.D.  On: 12/17/2023 16:55   CT GUIDED PERITONEAL/RETROPERITONEAL FLUID DRAIN BY PERC CATH Result Date: 12/17/2023 INDICATION: [id number] Abdominal fluid collection [phone]. Concern for bladder perforation EXAM: CT-GUIDED RIGHT LOWER QUADRANT ABSCESS DRAINAGE CATHETER PLACEMENT COMPARISON:  CT cystogram and CT AP, 12/16/2023. MEDICATIONS: The patient is currently admitted to the hospital and receiving intravenous antibiotics. The antibiotics were administered within an appropriate time frame prior to the initiation of the procedure. ANESTHESIA/SEDATION: Sedation by the ICU RN team was performed. Please see RN log for details. CONTRAST:  None FLUOROSCOPY TIME:  CT dose; 1011 mGycm COMPLICATIONS: None immediate. PROCEDURE: RADIATION DOSE REDUCTION: This exam was performed according to the departmental dose-optimization program which includes automated exposure control, adjustment of the mA and/or kV according to patient size and/or use of iterative reconstruction technique.  Informed written consent was obtained from the patient and/or patient's representative after a discussion of the risks, benefits and alternatives to treatment. The patient was placed supine on the CT gantry and a pre procedural CT was performed re-demonstrating the known abscess/fluid collection within the RIGHT lower quad. The procedure was planned. A timeout was performed prior to the initiation of the procedure. The RIGHT lower quad was prepped and draped in the usual sterile fashion. The overlying soft tissues were anesthetized with 1% lidocaine  with epinephrine . Appropriate trajectory was planned with the use of a 22 gauge spinal needle. An 18 gauge trocar needle was advanced into the abscess/fluid collection and a short Amplatz super stiff wire was coiled within the collection. Appropriate positioning was confirmed with a limited CT scan. The tract was serially dilated allowing placement of a 14 Fr Fr drainage catheter. Appropriate positioning was confirmed with a limited postprocedural CT scan. 30 mL of turbid fluid was aspirated. The tube was connected to a drainage bag and sutured in place. A dressing was placed. The patient tolerated the procedure well without immediate post procedural complication. IMPRESSION: Successful CT-guided placement of a 14 Fr drainage catheter into the RIGHT lower quadrant with aspiration of 30 mL mL of turbid fluid. Samples were sent to the laboratory as requested by the ordering clinical team. RECOMMENDATIONS: The patient will return to Vascular Interventional Radiology (VIR) for routine drainage catheter evaluation and exchange in 7-10 days. Thom Hall, MD [facility] Radiology Electronically Signed   By: Thom Hall M.D.   On: 12/17/2023 14:04   DG Chest Port 1 View Result Date: 12/17/2023 EXAM: 1 VIEW(S) XRAY OF THE CHEST 12/17/2023 05:48:00 AM COMPARISON: 12/16/2023 CLINICAL HISTORY: Respiratory failure; Hx of: chest  pain, coronary artery disease; Chief complaint: Loss of consciousness FINDINGS: LINES, TUBES AND DEVICES: Right IJ central venous catheter in place with tip over the SVC. ETT in place with tip 3.7 cm above the carina. Enteric tube in place, courses below the diaphragm with tip obscured. Overlying defibrillator pads. LUNGS AND PLEURA: Low lung volumes. Mild right basilar atelectasis. Similar left retrocardiac airspace opacity, likely atelectasis. No pulmonary edema. No pleural effusion. No pneumothorax. HEART AND MEDIASTINUM: No acute abnormality of the cardiac and mediastinal silhouettes. BONES AND SOFT TISSUES: Median sternotomy and CABG changes noted. IMPRESSION: 1. Low lung volumes with mild basilar atelectasis/retrocardiac atelectasis. 2. Support devices appropriately positioned, including right IJ central venous catheter with tip in the SVC, endotracheal tube 3.7 cm above the carina, and enteric tube coursing below the diaphragm. Electronically signed by: Waddell Calk MD 12/17/2023 06:09 AM EDT RP Workstation: [id number]   CT CYSTOGRAM ABD/PELVIS Result Date: 12/16/2023 CLINICAL DATA:  Sepsis with  concern for bladder perforation. EXAM: CT CYSTOGRAM (CT ABDOMEN AND PELVIS WITH CONTRAST) TECHNIQUE: Multi-detector CT imaging through the abdomen and pelvis was performed after dilute contrast had been introduced into the bladder for the purposes of performing CT cystography. RADIATION DOSE REDUCTION: This exam was performed according to the departmental dose-optimization program which includes automated exposure control, adjustment of the mA and/or kV according to patient size and/or use of iterative reconstruction technique. CONTRAST:  50mL OMNIPAQUE  IOHEXOL  300 MG/ML  SOLN COMPARISON:  12/16/2023 FINDINGS: Lower chest: Atelectasis or consolidation in both lung bases. Hepatobiliary: Cholelithiasis.  No bile duct dilatation. Pancreas: Unremarkable. No pancreatic ductal dilatation or surrounding inflammatory  changes. Spleen: Normal in size without focal abnormality. Adrenals/Urinary Tract: Left adrenal gland nodules. See previous description from prior CT earlier today. Kidneys are symmetrical. No hydronephrosis or hydroureter. Contrast material fills the bladder. Foley catheter in the bladder. No bladder wall thickening. Contrast material is demonstrated leaking along the catheter through the urethra to the distal penile region. There is focal irregularity at the junction of the urethra with the bladder base with small anterior contrast pooling suggesting contained leak. No contrast extravasation to suggest intraperitoneal leakage. Remainder the urethra appears otherwise intact. Stomach/Bowel: Enteric tube tip in the stomach. Dilated fluid-filled small bowel demonstrates some decompression since previous study. Colon is decompressed. Persistent wall thickening in the terminal ileum and colon. Vascular/Lymphatic: Aortic atherosclerosis. No enlarged abdominal or pelvic lymph nodes. Reproductive: Prostate gland is surgically absent. No significant pelvic lymphadenopathy. Other: Multiple loculated fluid collections demonstrated in the right abdomen and pelvis as previously discussed. Small amount of free fluid. Diffuse stranding throughout the intra-abdominal fat. Changes are consistent with multiple abscesses. Musculoskeletal: No acute bony abnormalities. No focal bone lesions. IMPRESSION: 1. CT cystogram with Foley catheter in place. Contrast material leaks along the catheter through the urethra. There is focal irregularity at the junction of the bladder base and urethra with small anterior contrast pooling suggesting contained perforation. No intraperitoneal contrast extravasation. 2. Appearance examination is otherwise unchanged. Again demonstrated are multiple loculated fluid collections in the abdomen and pelvis consistent with abscesses. Wall thickening of the cecum and terminal ileum. Fluid-filled dilated small  bowel appear mildly improved. Electronically Signed   By: Elsie Gravely M.D.   On: 12/16/2023 22:41   CT Angio Chest Pulmonary Embolism (PE) W or WO Contrast Result Date: 12/16/2023 CLINICAL DATA:  Aortic aneurysm suspected. Severe right-sided abdominal pain and abdominal distention. Decreased oxygen saturation. EXAM: CT ANGIOGRAPHY CHEST CT ABDOMEN AND PELVIS WITH CONTRAST TECHNIQUE: Multidetector CT imaging of the chest was performed using the standard protocol during bolus administration of intravenous contrast. Multiplanar CT image reconstructions and MIPs were obtained to evaluate the vascular anatomy. Multidetector CT imaging of the abdomen and pelvis was performed using the standard protocol during bolus administration of intravenous contrast. RADIATION DOSE REDUCTION: This exam was performed according to the departmental dose-optimization program which includes automated exposure control, adjustment of the mA and/or kV according to patient size and/or use of iterative reconstruction technique. CONTRAST:  75mL OMNIPAQUE  IOHEXOL  350 MG/ML SOLN COMPARISON:  Chest radiograph 12/16/2023.  PET-CT 06/23/2023 FINDINGS: CTA CHEST FINDINGS Cardiovascular: Cardiac enlargement. No pericardial effusions. Normal caliber thoracic aorta. No aortic dissection. Calcification of the aorta and coronary arteries. Great vessel origins are patent. Postoperative changes consistent with coronary bypass. No evidence of significant pulmonary embolus. Mediastinum/Nodes: Enteric tube with tip in the stomach. Esophagus is decompressed. Endotracheal tube with tip above the carina. Thyroid gland is unremarkable. Scattered mediastinal lymph  nodes are not pathologically enlarged, likely reactive. Lungs/Pleura: Consolidation or atelectasis in both lung bases, possibly pneumonia, compressive atelectasis, or aspiration. Small bilateral pleural effusions. No pneumothorax. Musculoskeletal: Degenerative changes in the spine. Sternotomy  wires. No acute bony abnormalities. Review of the MIP images confirms the above findings. CT ABDOMEN and PELVIS FINDINGS Hepatobiliary: No focal liver lesions. Cholelithiasis with several stones in the gallbladder. No wall thickening or inflammatory stranding. No bile duct dilatation. Pancreas: Unremarkable. No pancreatic ductal dilatation or surrounding inflammatory changes. Spleen: Normal in size without focal abnormality. Adrenals/Urinary Tract: Left adrenal gland nodule measuring 2.1 cm diameter. Sixty-five Hounsfield unit density measurement. The lesions are unchanged since prior unenhanced study from 12/18/2020 at which time the density measurement was -3 Hounsfield units. This is consistent with a benign fat containing adenoma and no imaging follow-up is indicated. Kidneys are symmetrical and homogeneous. No hydronephrosis or hydroureter. Bladder is decompressed. Stomach/Bowel: Stomach is unremarkable. Colon is decompressed. Wall thickening of the ascending colon and hepatic flexure, new since prior study. This may indicate colitis or inflammatory bowel disease versus reactive change. Dilated fluid-filled small bowel with air-fluid levels and mild diffuse wall thickening. Terminal ileum is decompressed. This is consistent with small-bowel obstruction, possibly due to inflammatory strictures. Vascular/Lymphatic: Aortic atherosclerosis. No enlarged abdominal or pelvic lymph nodes. Reproductive: Prostate is unremarkable. Other: Diffuse infiltration throughout the right-sided abdominal and pelvic mesenteric fat. Multiple loculated fluid collections are demonstrated throughout the right abdomen and pelvis. A right pelvic collection measures 7.4 cm diameter. A right anterior lower quadrant collection measures 8.8 cm diameter. A right anterior para psoas collection measures 9.4 cm diameter. Multiple smaller right upper quadrant collections are demonstrated. Some collections contains small gas bubbles. This  appearance is likely indicate multiple abscesses, possibly due to perforation. Could also consider a perforated appendicitis. Small amount of free fluid in the abdomen, likely ascites or reactive fluid. No free intra-abdominal gas. Musculoskeletal: Degenerative changes in the spine. No acute bony abnormalities. Review of the MIP images confirms the above findings. IMPRESSION: 1. Fluid-filled distended small bowel with decompressed terminal ileum consistent with small-bowel obstruction. 2. Irregular wall thickening diffusely throughout the distal small bowel and right colon suggesting enterocolitis or inflammatory bowel disease. Inflammatory strictures may be present. 3. Multiple loculated fluid collections, some with gas, demonstrated in the right abdomen and pelvis indicating abscesses. This is likely due to perforation, possibly related to inflammatory bowel disease or to an occult perforated appendicitis. The appendix is self is not demonstrated. No free intra-abdominal air. 4. Small amount of free fluid in the abdomen is likely reactive. 5. Cholelithiasis without evidence of acute cholecystitis. 6. Aortic atherosclerosis. 7. Consolidation versus compressive atelectasis in both lung bases. Small bilateral pleural effusions. Critical Value/emergent results were called by telephone at the time of interpretation on 12/16/2023 at 5:28 pm to provider MATTHEW TRIFAN , who verbally acknowledged these results. Electronically Signed   By: Elsie Gravely M.D.   On: 12/16/2023 17:33   CT ABDOMEN PELVIS W CONTRAST Result Date: 12/16/2023 CLINICAL DATA:  Aortic aneurysm suspected. Severe right-sided abdominal pain and abdominal distention. Decreased oxygen saturation. EXAM: CT ANGIOGRAPHY CHEST CT ABDOMEN AND PELVIS WITH CONTRAST TECHNIQUE: Multidetector CT imaging of the chest was performed using the standard protocol during bolus administration of intravenous contrast. Multiplanar CT image reconstructions and MIPs were  obtained to evaluate the vascular anatomy. Multidetector CT imaging of the abdomen and pelvis was performed using the standard protocol during bolus administration of intravenous contrast. RADIATION DOSE REDUCTION: This exam  was performed according to the departmental dose-optimization program which includes automated exposure control, adjustment of the mA and/or kV according to patient size and/or use of iterative reconstruction technique. CONTRAST:  75mL OMNIPAQUE  IOHEXOL  350 MG/ML SOLN COMPARISON:  Chest radiograph 12/16/2023.  PET-CT 06/23/2023 FINDINGS: CTA CHEST FINDINGS Cardiovascular: Cardiac enlargement. No pericardial effusions. Normal caliber thoracic aorta. No aortic dissection. Calcification of the aorta and coronary arteries. Great vessel origins are patent. Postoperative changes consistent with coronary bypass. No evidence of significant pulmonary embolus. Mediastinum/Nodes: Enteric tube with tip in the stomach. Esophagus is decompressed. Endotracheal tube with tip above the carina. Thyroid gland is unremarkable. Scattered mediastinal lymph nodes are not pathologically enlarged, likely reactive. Lungs/Pleura: Consolidation or atelectasis in both lung bases, possibly pneumonia, compressive atelectasis, or aspiration. Small bilateral pleural effusions. No pneumothorax. Musculoskeletal: Degenerative changes in the spine. Sternotomy wires. No acute bony abnormalities. Review of the MIP images confirms the above findings. CT ABDOMEN and PELVIS FINDINGS Hepatobiliary: No focal liver lesions. Cholelithiasis with several stones in the gallbladder. No wall thickening or inflammatory stranding. No bile duct dilatation. Pancreas: Unremarkable. No pancreatic ductal dilatation or surrounding inflammatory changes. Spleen: Normal in size without focal abnormality. Adrenals/Urinary Tract: Left adrenal gland nodule measuring 2.1 cm diameter. Sixty-five Hounsfield unit density measurement. The lesions are unchanged  since prior unenhanced study from 12/18/2020 at which time the density measurement was -3 Hounsfield units. This is consistent with a benign fat containing adenoma and no imaging follow-up is indicated. Kidneys are symmetrical and homogeneous. No hydronephrosis or hydroureter. Bladder is decompressed. Stomach/Bowel: Stomach is unremarkable. Colon is decompressed. Wall thickening of the ascending colon and hepatic flexure, new since prior study. This may indicate colitis or inflammatory bowel disease versus reactive change. Dilated fluid-filled small bowel with air-fluid levels and mild diffuse wall thickening. Terminal ileum is decompressed. This is consistent with small-bowel obstruction, possibly due to inflammatory strictures. Vascular/Lymphatic: Aortic atherosclerosis. No enlarged abdominal or pelvic lymph nodes. Reproductive: Prostate is unremarkable. Other: Diffuse infiltration throughout the right-sided abdominal and pelvic mesenteric fat. Multiple loculated fluid collections are demonstrated throughout the right abdomen and pelvis. A right pelvic collection measures 7.4 cm diameter. A right anterior lower quadrant collection measures 8.8 cm diameter. A right anterior para psoas collection measures 9.4 cm diameter. Multiple smaller right upper quadrant collections are demonstrated. Some collections contains small gas bubbles. This appearance is likely indicate multiple abscesses, possibly due to perforation. Could also consider a perforated appendicitis. Small amount of free fluid in the abdomen, likely ascites or reactive fluid. No free intra-abdominal gas. Musculoskeletal: Degenerative changes in the spine. No acute bony abnormalities. Review of the MIP images confirms the above findings. IMPRESSION: 1. Fluid-filled distended small bowel with decompressed terminal ileum consistent with small-bowel obstruction. 2. Irregular wall thickening diffusely throughout the distal small bowel and right colon  suggesting enterocolitis or inflammatory bowel disease. Inflammatory strictures may be present. 3. Multiple loculated fluid collections, some with gas, demonstrated in the right abdomen and pelvis indicating abscesses. This is likely due to perforation, possibly related to inflammatory bowel disease or to an occult perforated appendicitis. The appendix is self is not demonstrated. No free intra-abdominal air. 4. Small amount of free fluid in the abdomen is likely reactive. 5. Cholelithiasis without evidence of acute cholecystitis. 6. Aortic atherosclerosis. 7. Consolidation versus compressive atelectasis in both lung bases. Small bilateral pleural effusions. Critical Value/emergent results were called by telephone at the time of interpretation on 12/16/2023 at 5:28 pm to provider MATTHEW TRIFAN , who verbally  acknowledged these results. Electronically Signed   By: Elsie Gravely M.D.   On: 12/16/2023 17:33   CT HEAD WO CONTRAST Result Date: 12/16/2023 CLINICAL DATA:  Altered mental status. EXAM: CT HEAD WITHOUT CONTRAST TECHNIQUE: Contiguous axial images were obtained from the base of the skull through the vertex without intravenous contrast. RADIATION DOSE REDUCTION: This exam was performed according to the departmental dose-optimization program which includes automated exposure control, adjustment of the mA and/or kV according to patient size and/or use of iterative reconstruction technique. COMPARISON:  May 10, 2022 FINDINGS: Brain: No evidence of acute infarction, hemorrhage, hydrocephalus, extra-axial collection or mass lesion/mass effect. There are areas of mildly decreased attenuation within the white matter tracts of the supratentorial brain, consistent with microvascular disease changes. Vascular: No hyperdense vessel or unexpected calcification. Skull: Normal. Negative for fracture or focal lesion. Sinuses/Orbits: No acute finding. Other: None. IMPRESSION: No acute intracranial pathology.  Electronically Signed   By: Suzen Dials M.D.   On: 12/16/2023 17:17   DG Chest Port 1 View Result Date: 12/16/2023 CLINICAL DATA:  [id number] Encounter for central line placement [id number] EXAM: PORTABLE CHEST - 1 VIEW COMPARISON:  12/16/2023, 07/16/2023 FINDINGS: Similarly positioned endotracheal and esophagogastric tubes. Interval placement of a right IJ approach central venous catheter, which terminates at the cavoatrial junction. Improved aeration of the lungs. Retrocardiac airspace opacities. No pleural effusion or pneumothorax. No cardiomegaly. Sternotomy wires and CABG markers. Defibrillator pads. IMPRESSION: 1. Interval placement of a right IJ approach central venous catheter, which terminates at the cavoatrial junction. No pneumothorax. 2. Improved aeration of the lungs with retrocardiac airspace opacities, possibly atelectasis or aspiration. Electronically Signed   By: Rogelia Myers M.D.   On: 12/16/2023 16:28   DG Chest Port 1 View Result Date: 12/16/2023 CLINICAL DATA:  Central line and ET tube placement EXAM: PORTABLE CHEST - 1 VIEW COMPARISON:  July 16, 2023 FINDINGS: Endotracheal tube terminates in the mid trachea. Esophagogastric tube terminates in the region of the stomach. Overlying defibrillator pads. Markedly low lung volumes. No focal airspace consolidation, pleural effusion, or pneumothorax. No cardiomegaly. IMPRESSION: 1. Well-positioned endotracheal and esophagogastric tubes. No pneumothorax. 2. Markedly low lung volumes.  No pneumonia or pleural effusion. Electronically Signed   By: Rogelia Myers M.D.   On: 12/16/2023 16:26

## 2023-12-18 NOTE — Progress Notes (Signed)
 12/18/2023  Glenn Stewart [id number] 1966-03-18  CARE TEAM: PCP: Pcp, No  Outpatient Care Team: Patient Care Team: Pcp, No as PCP - General Crenshaw, Redell RAMAN, MD as PCP - Cardiology (Cardiology) [name], MD as Attending Physician (Urology)  Inpatient Treatment Team: Treatment Team:  Geronimo Amel, MD Lbcardiology, Rounding, MD Pccm, Md, MD Ccs, Md, MD Watt Rush, MD [name], MD Marlee Bernardino NOVAK, MD Ruthellen Ruthellen Radiology, MD Allyson Colander, RN Maritza Harlene CROME, RN Dimitri David, NT Swist, Debbie, RN Denninger, Vermell HERO, [facility] [place]   Problem List:   Principal Problem:   Shock [facility]) Active Problems:   S/P CABG x 5   Prostate cancer [facility])   History of abdominal prostatectomy   Pelvic abscess in male [facility])   AKI (acute kidney injury)   Class 2 obesity with body mass index (BMI) of 35 to 39.9 without comorbidity   Acute respiratory failure with hypoxia (HCC)  11/17/2023 Preoperative diagnosis:  Prostate Cancer    Postoperative diagnosis:  same    Procedure: Robotic assisted laparoscopic radical prostatectomy Bilateral pelvic lymph node dissection   Surgeon: Morene MICAEL Cam, MD      Assessment [facility] Stay = 2 days)      Multisystem organ failure with AKI one month after radical prostatectomy.  Most likely etiology is delayed urinoma secondarily infected    Plan:  Vent and shock control per critical care.  Weaning down = guardedly reassuring  IV antibiotics.  Piperacillin /tazobactam seems appropriate.  S/p IR drainage of RLQ/pelvis fluid collection 9/27.  Raises suspicion of urinoma - drain creatinine not elevated over serum.  Does not seem consistent with appendicitis or diverticulitis to my view nor partners [employer].    I believe the bowel is secondarily thickened and irritated with more of an ileus than a true obstruction.  There is no evidence of any pneumatosis or free air or perforation that warrants  laparotomy at this time.  In the absence of any frank bowel perforation/injury I think it would be a bad idea in this distended sick patient is operative risks are increased.    NGT output low c/w ileus.  If not extubating for a while, OK to try trophic TFs only to help bowel.  I would not trust the GI tract for enteral medications & d/c'd laxatives.   I would not do full TFs till he has bowel movements & off all pressors.  CVVHD for AKI.  Defer to Nephrology, Urology, & Critical care.  -monitor electrolytes & replace as needed  Keep K>4, Mg>2, Phos>3  -VTE prophylaxis- SCDs.  Anticoagulation prophyllaxis SQ as appropriate   The patient is improving with current treatment.  There is no strong evidence of failure of improvement nor decline with current non-operative management.  There is no need for surgery at the present moment.  We will continue to follow   I updated the patient's status to the patient's nurse  Recommendations were made.  Questions were answered.  She expressed understanding & appreciation.   I reviewed nursing notes, ED provider notes, Consultant urology.  Critical care.  Cardiology.  notes, last 24 h vitals and pain scores, last 48 h intake and output, last 24 h labs and trends, and last 24 h imaging results.  I have reviewed this patient's available data, including medical history, events of note, test results, etc as part of my evaluation.   A significant portion of that time was spent in counseling. Care during the described  time interval was provided by me.  This care required high  level of medical decision making.  12/18/2023    Subjective: (Chief complaint)  Intubated.  Wean PEEP weaned down from 14 to 5 - more minial settings.   No significant ETT secretions Weaning down on pressors.  Levophed  nearly off. No new cardiac events on Amiodarone .  ECHO 50-55% Drain placed. CVVHD started per nephrology and critical care. ICU nursing in  room.  Objective:  Vital signs:  Vitals:   12/18/23 0645 12/18/23 0700 12/18/23 0715 12/18/23 0730  BP:      Pulse: 83 83 83 84  Resp: (!) 30 (!) 28 (!) 28 (!) 28  Temp: 98.4 F (36.9 C) 98.4 F (36.9 C) 98.4 F (36.9 C) 98.4 F (36.9 C)  TempSrc:      SpO2: 96% 96% 96% 96%  Weight:      Height:        Last BM Date : 12/16/23  Intake/Output   Yesterday:  09/27 0701 - 09/28 0700 In: 5115.5 [I.V.:3201.6; NG/GT:60; IV Piggyback:1848.9] Out: 3464 [Urine:60; Emesis/NG output:200; Drains:1125] This shift:  No intake/output data recorded.  Bowel function:  Flatus: No  BM:  No  Drain: NG tube with scant liquid brown   Physical Exam:  General: Pt intubated and sedated.  Will open eyes to voice.  Somewhat follows commands.  moderate acute distress Eyes: PERRL, normal EOM.  Sclera clear.  No icterus Neuro: CN II-XII intact w/o focal sensory/motor deficits. Lymph: No head/neck/groin lymphadenopathy Psych: Sedated.  Not agitated.  Drainage.  Not consistently following commands to me but has done that in the past 24 hours at best HENT: Normocephalic, Mucus membranes moist.  No thrush Neck: Supple, No tracheal deviation.  No obvious thyromegaly Chest: No pain to chest wall compression.  Good respiratory excursion.  No audible wheezing CV:  Pulses intact.  Regular rhythm.  No major extremity edema MS: Normal AROM mjr joints.  No obvious deformity  Abdomen: Rigid.  Very distended.  Mild discomfort to deeper palpation but no rebound tenderness..   Ext:   No deformity.  No mjr edema.  No cyanosis Skin: No petechiae / purpurea.  No major sores.  Warm and dry    Results:   Cultures: Recent Results (from the past 720 hours)  Urine Culture     Status: None (Preliminary result)   Collection Time: 12/16/23  3:32 PM   Specimen: Urine, Random  Result Value Ref Range Status   Specimen Description URINE, RANDOM  Final   Special Requests   Final    URINE, CATHETERIZED Performed  at [facility] Lab, [address], [place], [place] [address]    Culture PENDING  Incomplete   Report Status PENDING  Incomplete  MRSA Next Gen by PCR, Nasal     Status: None   Collection Time: 12/16/23  6:39 PM   Specimen: Nasal Mucosa; Nasal Swab  Result Value Ref Range Status   MRSA by PCR Next Gen NOT DETECTED NOT DETECTED Final    Comment: (NOTE) The GeneXpert MRSA Assay (FDA approved for NASAL specimens only), is one component of a comprehensive MRSA colonization surveillance program. It is not intended to diagnose MRSA infection nor to guide or monitor treatment for MRSA infections. Test performance is not FDA approved in patients less than 40 years old. Performed at [facility] Lab, [address], [place], [place] [address]   Culture, blood (Routine X 2) w Reflex to ID Panel  Status: None (Preliminary result)   Collection Time: 12/16/23  7:20 PM   Specimen: BLOOD LEFT HAND  Result Value Ref Range Status   Specimen Description BLOOD LEFT HAND  Final   Special Requests   Final    BOTTLES DRAWN AEROBIC AND ANAEROBIC Blood Culture adequate volume   Culture   Final    NO GROWTH < 12 HOURS Performed at [facility], [address] [place], [place] [address]    Report Status PENDING  Incomplete  Culture, blood (Routine X 2) w Reflex to ID Panel     Status: None (Preliminary result)   Collection Time: 12/17/23  2:06 AM   Specimen: BLOOD  Result Value Ref Range Status   Specimen Description BLOOD SITE NOT SPECIFIED  Final   Special Requests   Final    BOTTLES DRAWN AEROBIC AND ANAEROBIC Blood Culture adequate volume   Culture   Final    NO GROWTH < 12 HOURS Performed at [facility] Lab, [address] [place], [place] [address]    Report Status PENDING  Incomplete  Aerobic/Anaerobic Culture w Gram Stain (surgical/deep wound)     Status: None (Preliminary result)   Collection Time: 12/17/23 12:38 PM   Specimen: Abscess  Result Value Ref Range Status    Specimen Description ABSCESS  Final   Special Requests NONE  Final   Gram Stain   Final    ABUNDANT WBC PRESENT, PREDOMINANTLY PMN ABUNDANT GRAM POSITIVE COCCI IN CLUSTERS INTRACELLULAR Performed at [facility] Lab, [address], [place], [place] [address]    Culture PENDING  Incomplete   Report Status PENDING  Incomplete    Labs: Results for orders placed or performed during the hospital encounter of 12/16/23 (from the past 48 hours)  Comprehensive metabolic panel     Status: Abnormal   Collection Time: 12/16/23  3:24 PM  Result Value Ref Range   Sodium 129 (L) 135 - 145 mmol/L   Potassium 2.8 (L) 3.5 - 5.1 mmol/L   Chloride 96 (L) 98 - 111 mmol/L   CO2 16 (L) 22 - 32 mmol/L   Glucose, Bld 118 (H) 70 - 99 mg/dL    Comment: Glucose reference range applies only to samples taken after fasting for at least 8 hours.   BUN 55 (H) 6 - 20 mg/dL   Creatinine, Ser 6.07 (H) 0.61 - 1.24 mg/dL   Calcium  6.3 (LL) 8.9 - 10.3 mg/dL    Comment: CRITICAL RESULT CALLED TO, READ BACK BY AND VERIFIED WITH V. VALDEZ RN @1633  ON 12/16/23 MAB   Total Protein 5.2 (L) 6.5 - 8.1 g/dL   Albumin  1.6 (L) 3.5 - 5.0 g/dL   AST 68 (H) 15 - 41 U/L   ALT 37 0 - 44 U/L   Alkaline Phosphatase 69 38 - 126 U/L   Total Bilirubin 3.4 (H) 0.0 - 1.2 mg/dL   GFR, Estimated 17 (L) >60 mL/min    Comment: (NOTE) Calculated using the CKD-EPI Creatinine Equation (2021)    Anion gap 17 (H) 5 - 15    Comment: Performed at [facility] Lab, [address] [place], [place] [address]  CBC     Status: Abnormal   Collection Time: 12/16/23  3:24 PM  Result Value Ref Range   WBC 6.0 4.0 - 10.5 K/uL   RBC 4.65 4.22 - 5.81 MIL/uL   Hemoglobin 12.6 (L) 13.0 - 17.0 g/dL   HCT 62.8 (L) 60.9 - 47.9 %  MCV 79.8 (L) 80.0 - 100.0 fL   MCH 27.1 26.0 - 34.0 pg   MCHC 34.0 30.0 - 36.0 g/dL   RDW 86.2 88.4 - 84.4 %   Platelets 324 150 - 400 K/uL   nRBC 5.7 (H) 0.0 - 0.2 %    Comment: Performed at [facility] Lab, [address], [place], [place] [address]  APTT     Status: None   Collection Time: 12/16/23  3:24 PM  Result Value Ref Range   aPTT 29 24 - 36 seconds    Comment: Performed at [facility] Lab, [address], [place], [place] [address]  Protime-INR     Status: Abnormal   Collection Time: 12/16/23  3:24 PM  Result Value Ref Range   Prothrombin Time 16.3 (H) 11.4 - 15.2 seconds   INR 1.2 0.8 - 1.2    Comment: (NOTE) INR goal varies based on device and disease states. Performed at [facility] Lab, [address] [place], [place] [address]   Magnesium      Status: None   Collection Time: 12/16/23  3:24 PM  Result Value Ref Range   Magnesium  2.3 1.7 - 2.4 mg/dL    Comment: Performed at [facility] Lab, [address], [place], [place] [address]  Phosphorus     Status: Abnormal   Collection Time: 12/16/23  3:24 PM  Result Value Ref Range   Phosphorus 7.3 (H) 2.5 - 4.6 mg/dL    Comment: Performed at [facility] Lab, [address], [place], [place] [address]  Troponin I (High Sensitivity)     Status: Abnormal   Collection Time: 12/16/23  3:24 PM  Result Value Ref Range   Troponin I (High Sensitivity) 82 (H) <18 ng/L    Comment: (NOTE) Elevated high sensitivity troponin I (hsTnI) values and significant  changes across serial measurements may suggest ACS but many other  chronic and acute conditions are known to elevate hsTnI results.  Refer to the Links section for chest pain algorithms and additional  guidance. Performed at [facility] Lab, [address], [place], [place] [address]   Triglycerides     Status: None   Collection Time: 12/16/23  3:26 PM  Result Value Ref Range   Triglycerides 145 <150 mg/dL    Comment: Performed at [facility] Lab, [address] [place] [address], [place], [place] [address]  Rapid urine drug screen (hospital performed)     Status: None   Collection Time: 12/16/23  3:32 PM  Result Value Ref Range   Opiates NONE DETECTED NONE DETECTED   Cocaine NONE  DETECTED NONE DETECTED   Benzodiazepines NONE DETECTED NONE DETECTED   Amphetamines NONE DETECTED NONE DETECTED   Tetrahydrocannabinol NONE DETECTED NONE DETECTED   Barbiturates NONE DETECTED NONE DETECTED    Comment: (NOTE) DRUG SCREEN FOR MEDICAL PURPOSES ONLY.  IF CONFIRMATION IS NEEDED FOR ANY PURPOSE, NOTIFY LAB WITHIN 5 DAYS.  LOWEST DETECTABLE LIMITS FOR URINE DRUG SCREEN Drug Class                     Cutoff (ng/mL) Amphetamine and metabolites    1000 Barbiturate and metabolites    200 Benzodiazepine                 200 Opiates and metabolites        300 Cocaine and metabolites        300 THC  50 Performed at [facility] Lab, [address] [place], [place] [address]   Urinalysis, w/ Reflex to Culture (Infection Suspected) -Urine, Catheterized; Indwelling urinary catheter     Status: Abnormal   Collection Time: 12/16/23  3:32 PM  Result Value Ref Range   Specimen Source URINE, CATHETERIZED    Color, Urine YELLOW YELLOW   APPearance CLOUDY (A) CLEAR   Specific Gravity, Urine 1.012 1.005 - 1.030   pH 5.0 5.0 - 8.0   Glucose, UA NEGATIVE NEGATIVE mg/dL   Hgb urine dipstick MODERATE (A) NEGATIVE   Bilirubin Urine NEGATIVE NEGATIVE   Ketones, ur NEGATIVE NEGATIVE mg/dL   Protein, ur 30 (A) NEGATIVE mg/dL   Nitrite NEGATIVE NEGATIVE   Leukocytes,Ua LARGE (A) NEGATIVE   RBC / HPF 11-20 0 - 5 RBC/hpf   WBC, UA >50 0 - 5 WBC/hpf    Comment:        Reflex urine culture not performed if WBC <=10, OR if Squamous epithelial cells >5. If Squamous epithelial cells >5 suggest recollection.    Bacteria, UA RARE (A) NONE SEEN   Squamous Epithelial / HPF 0-5 0 - 5 /HPF   WBC Clumps PRESENT    Mucus PRESENT    Non Squamous Epithelial 0-5 (A) NONE SEEN    Comment: Performed at [facility] Lab, [address], [place], [place] [address]  Urine Culture     Status: None (Preliminary result)   Collection Time: 12/16/23  3:32 PM   Specimen: Urine,  Random  Result Value Ref Range   Specimen Description URINE, RANDOM    Special Requests      URINE, CATHETERIZED Performed at [facility] Lab, [address] [place], [place] [address]    Culture PENDING    Report Status PENDING   I-stat chem 8, ED (not at [facility], [facility] or [facility])     Status: Abnormal   Collection Time: 12/16/23  3:35 PM  Result Value Ref Range   Sodium 130 (L) 135 - 145 mmol/L   Potassium 2.8 (L) 3.5 - 5.1 mmol/L   Chloride 96 (L) 98 - 111 mmol/L   BUN 50 (H) 6 - 20 mg/dL   Creatinine, Ser 5.89 (H) 0.61 - 1.24 mg/dL   Glucose, Bld 885 (H) 70 - 99 mg/dL    Comment: Glucose reference range applies only to samples taken after fasting for at least 8 hours.   Calcium , Ion 0.79 (LL) 1.15 - 1.40 mmol/L   TCO2 17 (L) 22 - 32 mmol/L   Hemoglobin 12.9 (L) 13.0 - 17.0 g/dL   HCT 61.9 (L) 60.9 - 47.9 %   Comment NOTIFIED PHYSICIAN   I-Stat Lactic Acid, ED     Status: Abnormal   Collection Time: 12/16/23  3:38 PM  Result Value Ref Range   Lactic Acid, Venous 3.3 (HH) 0.5 - 1.9 mmol/L   Comment NOTIFIED PHYSICIAN   I-Stat CG4 Lactic Acid     Status: Abnormal   Collection Time: 12/16/23  4:00 PM  Result Value Ref Range   Lactic Acid, Venous 3.5 (HH) 0.5 - 1.9 mmol/L   Comment NOTIFIED PHYSICIAN   I-Stat arterial blood gas, ED     Status: Abnormal   Collection Time: 12/16/23  5:10 PM  Result Value Ref Range   pH, Arterial 7.219 (L) 7.35 - 7.45   pCO2 arterial 54.3 (H) 32 - 48 mmHg   pO2, Arterial 74 (L) 83 - 108 mmHg   Bicarbonate 22.3 20.0 -  28.0 mmol/L   TCO2 24 22 - 32 mmol/L   O2 Saturation 92 %   Acid-base deficit 6.0 (H) 0.0 - 2.0 mmol/L   Sodium 123 (L) 135 - 145 mmol/L   Potassium 3.4 (L) 3.5 - 5.1 mmol/L   Calcium , Ion 0.97 (L) 1.15 - 1.40 mmol/L   HCT 37.0 (L) 39.0 - 52.0 %   Hemoglobin 12.6 (L) 13.0 - 17.0 g/dL   Patient temperature 02.0 F    Sample type ARTERIAL   CBG monitoring, ED     Status: Abnormal   Collection Time: 12/16/23  5:20 PM  Result Value  Ref Range   Glucose-Capillary 137 (H) 70 - 99 mg/dL    Comment: Glucose reference range applies only to samples taken after fasting for at least 8 hours.  Glucose, capillary     Status: Abnormal   Collection Time: 12/16/23  6:24 PM  Result Value Ref Range   Glucose-Capillary 157 (H) 70 - 99 mg/dL    Comment: Glucose reference range applies only to samples taken after fasting for at least 8 hours.  MRSA Next Gen by PCR, Nasal     Status: None   Collection Time: 12/16/23  6:39 PM   Specimen: Nasal Mucosa; Nasal Swab  Result Value Ref Range   MRSA by PCR Next Gen NOT DETECTED NOT DETECTED    Comment: (NOTE) The GeneXpert MRSA Assay (FDA approved for NASAL specimens only), is one component of a comprehensive MRSA colonization surveillance program. It is not intended to diagnose MRSA infection nor to guide or monitor treatment for MRSA infections. Test performance is not FDA approved in patients less than 74 years old. Performed at [facility], [address], [place], [place] [address]   HIV Antibody (routine testing w rflx)     Status: None   Collection Time: 12/16/23  6:39 PM  Result Value Ref Range   HIV Screen 4th Generation wRfx Non Reactive Non Reactive    Comment: Performed at [facility] Lab, [address], [place], [place] [address]  Lactic acid, plasma     Status: None   Collection Time: 12/16/23  6:39 PM  Result Value Ref Range   Lactic Acid, Venous 1.2 0.5 - 1.9 mmol/L    Comment: Performed at [facility], [address], [place], [place] [address]  Troponin I (High Sensitivity)     Status: Abnormal   Collection Time: 12/16/23  6:39 PM  Result Value Ref Range   Troponin I (High Sensitivity) 95 (H) <18 ng/L    Comment: (NOTE) Elevated high sensitivity troponin I (hsTnI) values and significant  changes across serial measurements may suggest ACS but many other  chronic and acute conditions are known to elevate hsTnI results.  Refer to the Links section  for chest pain algorithms and additional  guidance. Performed at [facility], [address] [place], [place] [address]   Culture, blood (Routine X 2) w Reflex to ID Panel     Status: None (Preliminary result)   Collection Time: 12/16/23  7:20 PM   Specimen: BLOOD LEFT HAND  Result Value Ref Range   Specimen Description BLOOD LEFT HAND    Special Requests      BOTTLES DRAWN AEROBIC AND ANAEROBIC Blood Culture adequate volume   Culture      NO GROWTH < 12 HOURS Performed at [facility], [address] [place], [place] [address]    Report Status PENDING  Glucose, capillary     Status: Abnormal   Collection Time: 12/16/23  7:52 PM  Result Value Ref Range   Glucose-Capillary 136 (H) 70 - 99 mg/dL    Comment: Glucose reference range applies only to samples taken after fasting for at least 8 hours.  Basic metabolic panel     Status: Abnormal   Collection Time: 12/16/23  8:23 PM  Result Value Ref Range   Sodium 124 (L) 135 - 145 mmol/L   Potassium 3.7 3.5 - 5.1 mmol/L   Chloride 89 (L) 98 - 111 mmol/L   CO2 19 (L) 22 - 32 mmol/L   Glucose, Bld 137 (H) 70 - 99 mg/dL    Comment: Glucose reference range applies only to samples taken after fasting for at least 8 hours.   BUN 66 (H) 6 - 20 mg/dL   Creatinine, Ser 5.54 (H) 0.61 - 1.24 mg/dL   Calcium  6.9 (L) 8.9 - 10.3 mg/dL   GFR, Estimated 14 (L) >60 mL/min    Comment: (NOTE) Calculated using the CKD-EPI Creatinine Equation (2021)    Anion gap 16 (H) 5 - 15    Comment: Performed at [facility] Lab, [address] [place], [place] [address]  Lactic acid, plasma     Status: None   Collection Time: 12/16/23  8:23 PM  Result Value Ref Range   Lactic Acid, Venous 1.3 0.5 - 1.9 mmol/L    Comment: Performed at [facility], [address], [place], [place] [address]  I-STAT 7, (LYTES, BLD GAS, ICA, H+H)     Status: Abnormal   Collection Time: 12/16/23  9:24 PM  Result Value Ref Range   pH, Arterial 7.301 (L)  7.35 - 7.45   pCO2 arterial 41.3 32 - 48 mmHg   pO2, Arterial 102 83 - 108 mmHg   Bicarbonate 20.4 20.0 - 28.0 mmol/L   TCO2 22 22 - 32 mmol/L   O2 Saturation 97 %   Acid-base deficit 6.0 (H) 0.0 - 2.0 mmol/L   Sodium 121 (L) 135 - 145 mmol/L   Potassium 4.0 3.5 - 5.1 mmol/L   Calcium , Ion 0.95 (L) 1.15 - 1.40 mmol/L   HCT 35.0 (L) 39.0 - 52.0 %   Hemoglobin 11.9 (L) 13.0 - 17.0 g/dL   Patient temperature 63.3 C    Sample type ARTERIAL   Glucose, capillary     Status: Abnormal   Collection Time: 12/16/23 11:08 PM  Result Value Ref Range   Glucose-Capillary 133 (H) 70 - 99 mg/dL    Comment: Glucose reference range applies only to samples taken after fasting for at least 8 hours.  Culture, blood (Routine X 2) w Reflex to ID Panel     Status: None (Preliminary result)   Collection Time: 12/17/23  2:06 AM   Specimen: BLOOD  Result Value Ref Range   Specimen Description BLOOD SITE NOT SPECIFIED    Special Requests      BOTTLES DRAWN AEROBIC AND ANAEROBIC Blood Culture adequate volume   Culture      NO GROWTH < 12 HOURS Performed at [facility] Lab, [address], [place], [place] [address]    Report Status PENDING   Glucose, capillary     Status: Abnormal   Collection Time: 12/17/23  3:20 AM  Result Value Ref Range   Glucose-Capillary 123 (H) 70 - 99 mg/dL    Comment: Glucose reference range applies only to samples taken after fasting for at least 8  hours.  Basic metabolic panel     Status: Abnormal   Collection Time: 12/17/23  3:33 AM  Result Value Ref Range   Sodium 125 (L) 135 - 145 mmol/L   Potassium 4.3 3.5 - 5.1 mmol/L   Chloride 87 (L) 98 - 111 mmol/L   CO2 17 (L) 22 - 32 mmol/L   Glucose, Bld 121 (H) 70 - 99 mg/dL    Comment: Glucose reference range applies only to samples taken after fasting for at least 8 hours.   BUN 74 (H) 6 - 20 mg/dL   Creatinine, Ser 4.86 (H) 0.61 - 1.24 mg/dL   Calcium  7.0 (L) 8.9 - 10.3 mg/dL   GFR, Estimated 12 (L) >60 mL/min     Comment: (NOTE) Calculated using the CKD-EPI Creatinine Equation (2021)    Anion gap 21 (H) 5 - 15    Comment: ELECTROLYTES REPEATED TO VERIFY Performed at [facility] Lab, [address] [place], [place] [address]   CBC     Status: Abnormal   Collection Time: 12/17/23  3:33 AM  Result Value Ref Range   WBC 4.8 4.0 - 10.5 K/uL   RBC 4.50 4.22 - 5.81 MIL/uL   Hemoglobin 12.1 (L) 13.0 - 17.0 g/dL   HCT 64.5 (L) 60.9 - 47.9 %   MCV 78.7 (L) 80.0 - 100.0 fL   MCH 26.9 26.0 - 34.0 pg   MCHC 34.2 30.0 - 36.0 g/dL   RDW 85.8 88.4 - 84.4 %   Platelets 303 150 - 400 K/uL   nRBC 1.9 (H) 0.0 - 0.2 %    Comment: Performed at [facility] Lab, [address], [place], [place] [address]  Hepatic function panel     Status: Abnormal   Collection Time: 12/17/23  3:33 AM  Result Value Ref Range   Total Protein 5.6 (L) 6.5 - 8.1 g/dL   Albumin  1.5 (L) 3.5 - 5.0 g/dL   AST 83 (H) 15 - 41 U/L   ALT 43 0 - 44 U/L   Alkaline Phosphatase 85 38 - 126 U/L   Total Bilirubin 3.9 (H) 0.0 - 1.2 mg/dL   Bilirubin, Direct 2.6 (H) 0.0 - 0.2 mg/dL   Indirect Bilirubin 1.3 (H) 0.3 - 0.9 mg/dL    Comment: Performed at [facility] Lab, [address] [place], [place] [address]  Magnesium      Status: Abnormal   Collection Time: 12/17/23  3:33 AM  Result Value Ref Range   Magnesium  2.9 (H) 1.7 - 2.4 mg/dL    Comment: Performed at [facility] Lab, [address] [place], [place] [address]  Phosphorus     Status: Abnormal   Collection Time: 12/17/23  3:33 AM  Result Value Ref Range   Phosphorus 11.7 (H) 2.5 - 4.6 mg/dL    Comment: Performed at [facility] Lab, [address], [place], [place] [address]  Triglycerides     Status: Abnormal   Collection Time: 12/17/23  3:34 AM  Result Value Ref Range   Triglycerides 271 (H) <150 mg/dL    Comment: Performed at [facility] Lab, [address] [place], [place] [address]  I-STAT 7, (LYTES, BLD GAS, ICA, H+H)     Status: Abnormal   Collection  Time: 12/17/23  4:22 AM  Result Value Ref Range   pH, Arterial 7.292 (L) 7.35 - 7.45   pCO2 arterial 42.5 32 - 48 mmHg   pO2, Arterial 95 83 - 108 mmHg  Bicarbonate 20.6 20.0 - 28.0 mmol/L   TCO2 22 22 - 32 mmol/L   O2 Saturation 97 %   Acid-base deficit 6.0 (H) 0.0 - 2.0 mmol/L   Sodium 123 (L) 135 - 145 mmol/L   Potassium 4.5 3.5 - 5.1 mmol/L   Calcium , Ion 0.87 (LL) 1.15 - 1.40 mmol/L   HCT 38.0 (L) 39.0 - 52.0 %   Hemoglobin 12.9 (L) 13.0 - 17.0 g/dL   Patient temperature 63.2 C    Sample type ARTERIAL   Glucose, capillary     Status: Abnormal   Collection Time: 12/17/23  7:07 AM  Result Value Ref Range   Glucose-Capillary 106 (H) 70 - 99 mg/dL    Comment: Glucose reference range applies only to samples taken after fasting for at least 8 hours.  I-STAT 7, (LYTES, BLD GAS, ICA, H+H)     Status: Abnormal   Collection Time: 12/17/23  9:08 AM  Result Value Ref Range   pH, Arterial 7.346 (L) 7.35 - 7.45   pCO2 arterial 36.8 32 - 48 mmHg   pO2, Arterial 116 (H) 83 - 108 mmHg   Bicarbonate 20.1 20.0 - 28.0 mmol/L   TCO2 21 (L) 22 - 32 mmol/L   O2 Saturation 98 %   Acid-base deficit 5.0 (H) 0.0 - 2.0 mmol/L   Sodium 123 (L) 135 - 145 mmol/L   Potassium 4.5 3.5 - 5.1 mmol/L   Calcium , Ion 0.90 (L) 1.15 - 1.40 mmol/L   HCT 35.0 (L) 39.0 - 52.0 %   Hemoglobin 11.9 (L) 13.0 - 17.0 g/dL   Patient temperature 62.9 C    Sample type ARTERIAL   Creatinine, fluid (pleural, peritoneal, JP Drainage)     Status: None   Collection Time: 12/17/23  9:18 AM  Result Value Ref Range   Creat, Fluid 4.9 mg/dL    Comment: (NOTE) No normal range established for this test Results should be evaluated in conjunction with serum values    Fluid Type-FCRE TIMED     Comment:  NO CYTO PERITONEAL Performed at [facility], [address], [place], [place] [address] CORRECTED ON 09/27 AT 1417: PREVIOUSLY REPORTED AS PERITONEAL CAVITY   Cooxemetry Panel (carboxy, met, total hgb, O2 sat)      Status: Abnormal   Collection Time: 12/17/23 10:02 AM  Result Value Ref Range   Total hemoglobin 11.5 (L) 12.0 - 16.0 g/dL   O2 Saturation 22.2 %   Carboxyhemoglobin 1.2 0.5 - 1.5 %   Methemoglobin <0.7 0.0 - 1.5 %    Comment: Performed at [facility], [address] [place], [place] [address]  Hemoglobin A1c     Status: Abnormal   Collection Time: 12/17/23 10:02 AM  Result Value Ref Range   Hgb A1c MFr Bld 6.0 (H) 4.8 - 5.6 %    Comment: (NOTE) Diagnosis of Diabetes The following HbA1c ranges recommended by the American Diabetes Association (ADA) may be used as an aid in the diagnosis of diabetes mellitus.  Hemoglobin             Suggested A1C NGSP%              Diagnosis  <5.7                   Non Diabetic  5.7-6.4                Pre-Diabetic  >6.4  Diabetic  <7.0                   Glycemic control for                       adults with diabetes.     Mean Plasma Glucose 125.5 mg/dL    Comment: Performed at [facility], [address], [place], [place] [address]  I-STAT 7, (LYTES, BLD GAS, ICA, H+H)     Status: Abnormal   Collection Time: 12/17/23 10:37 AM  Result Value Ref Range   pH, Arterial 7.349 (L) 7.35 - 7.45   pCO2 arterial 34.2 32 - 48 mmHg   pO2, Arterial 91 83 - 108 mmHg   Bicarbonate 18.8 (L) 20.0 - 28.0 mmol/L   TCO2 20 (L) 22 - 32 mmol/L   O2 Saturation 97 %   Acid-base deficit 6.0 (H) 0.0 - 2.0 mmol/L   Sodium 123 (L) 135 - 145 mmol/L   Potassium 4.4 3.5 - 5.1 mmol/L   Calcium , Ion 0.89 (LL) 1.15 - 1.40 mmol/L   HCT 32.0 (L) 39.0 - 52.0 %   Hemoglobin 10.9 (L) 13.0 - 17.0 g/dL   Patient temperature 62.9 C    Collection site RADIAL, ALLEN'S TEST ACCEPTABLE    Drawn by RT    Sample type ARTERIAL    Comment NOTIFIED PHYSICIAN   Glucose, capillary     Status: Abnormal   Collection Time: 12/17/23 11:14 AM  Result Value Ref Range   Glucose-Capillary 139 (H) 70 - 99 mg/dL    Comment: Glucose reference range applies only  to samples taken after fasting for at least 8 hours.  Aerobic/Anaerobic Culture w Gram Stain (surgical/deep wound)     Status: None (Preliminary result)   Collection Time: 12/17/23 12:38 PM   Specimen: Abscess  Result Value Ref Range   Specimen Description ABSCESS    Special Requests NONE    Gram Stain      ABUNDANT WBC PRESENT, PREDOMINANTLY PMN ABUNDANT GRAM POSITIVE COCCI IN CLUSTERS INTRACELLULAR Performed at [facility], [address], [place], [place] [address]    Culture PENDING    Report Status PENDING   Glucose, capillary     Status: Abnormal   Collection Time: 12/17/23  2:58 PM  Result Value Ref Range   Glucose-Capillary 131 (H) 70 - 99 mg/dL    Comment: Glucose reference range applies only to samples taken after fasting for at least 8 hours.  Renal function panel (daily at 1600)     Status: Abnormal   Collection Time: 12/17/23  6:04 PM  Result Value Ref Range   Sodium 126 (L) 135 - 145 mmol/L   Potassium 4.5 3.5 - 5.1 mmol/L   Chloride 88 (L) 98 - 111 mmol/L   CO2 18 (L) 22 - 32 mmol/L   Glucose, Bld 117 (H) 70 - 99 mg/dL    Comment: Glucose reference range applies only to samples taken after fasting for at least 8 hours.   BUN 86 (H) 6 - 20 mg/dL   Creatinine, Ser 4.16 (H) 0.61 - 1.24 mg/dL   Calcium  7.2 (L) 8.9 - 10.3 mg/dL   Phosphorus 89.7 (H) 2.5 - 4.6 mg/dL   Albumin  <1.5 (L) 3.5 - 5.0 g/dL   GFR, Estimated 10 (L) >60 mL/min    Comment: (NOTE) Calculated using the CKD-EPI Creatinine Equation (2021)    Anion gap 20 (H) 5 - 15  Comment: Performed at [facility], [address] [place], [place] [address]  Glucose, capillary     Status: Abnormal   Collection Time: 12/17/23  7:49 PM  Result Value Ref Range   Glucose-Capillary 114 (H) 70 - 99 mg/dL    Comment: Glucose reference range applies only to samples taken after fasting for at least 8 hours.  Glucose, capillary     Status: Abnormal   Collection Time: 12/17/23 11:51 PM  Result Value Ref  Range   Glucose-Capillary 144 (H) 70 - 99 mg/dL    Comment: Glucose reference range applies only to samples taken after fasting for at least 8 hours.  CBC     Status: Abnormal   Collection Time: 12/18/23  3:36 AM  Result Value Ref Range   WBC 9.8 4.0 - 10.5 K/uL   RBC 3.86 (L) 4.22 - 5.81 MIL/uL   Hemoglobin 10.6 (L) 13.0 - 17.0 g/dL   HCT 70.3 (L) 60.9 - 47.9 %   MCV 76.7 (L) 80.0 - 100.0 fL   MCH 27.5 26.0 - 34.0 pg   MCHC 35.8 30.0 - 36.0 g/dL   RDW 85.3 88.4 - 84.4 %   Platelets 260 150 - 400 K/uL   nRBC 0.3 (H) 0.0 - 0.2 %    Comment: Performed at [facility], [address], [place], [place] [address]  Comprehensive metabolic panel with GFR     Status: Abnormal   Collection Time: 12/18/23  3:36 AM  Result Value Ref Range   Sodium 127 (L) 135 - 145 mmol/L   Potassium 4.1 3.5 - 5.1 mmol/L   Chloride 93 (L) 98 - 111 mmol/L   CO2 20 (L) 22 - 32 mmol/L   Glucose, Bld 130 (H) 70 - 99 mg/dL    Comment: Glucose reference range applies only to samples taken after fasting for at least 8 hours.   BUN 68 (H) 6 - 20 mg/dL   Creatinine, Ser 5.44 (H) 0.61 - 1.24 mg/dL   Calcium  7.0 (L) 8.9 - 10.3 mg/dL   Total Protein 5.4 (L) 6.5 - 8.1 g/dL   Albumin  <1.5 (L) 3.5 - 5.0 g/dL   AST 85 (H) 15 - 41 U/L   ALT 41 0 - 44 U/L   Alkaline Phosphatase 105 38 - 126 U/L   Total Bilirubin 3.1 (H) 0.0 - 1.2 mg/dL   GFR, Estimated 14 (L) >60 mL/min    Comment: (NOTE) Calculated using the CKD-EPI Creatinine Equation (2021)    Anion gap 14 5 - 15    Comment: Performed at [facility], [address] [place], [place] [address]  Renal function panel (daily at 0500)     Status: Abnormal   Collection Time: 12/18/23  3:36 AM  Result Value Ref Range   Sodium 126 (L) 135 - 145 mmol/L   Potassium 4.1 3.5 - 5.1 mmol/L   Chloride 91 (L) 98 - 111 mmol/L   CO2 20 (L) 22 - 32 mmol/L   Glucose, Bld 129 (H) 70 - 99 mg/dL    Comment: Glucose reference range applies only to samples taken after  fasting for at least 8 hours.   BUN 68 (H) 6 - 20 mg/dL   Creatinine, Ser 5.60 (H) 0.61 - 1.24 mg/dL   Calcium  7.0 (L) 8.9 - 10.3 mg/dL   Phosphorus 6.1 (H) 2.5 - 4.6 mg/dL   Albumin  <1.5 (L) 3.5 - 5.0 g/dL   GFR, Estimated 15 (L) >60 mL/min  Comment: (NOTE) Calculated using the CKD-EPI Creatinine Equation (2021)    Anion gap 15 5 - 15    Comment: Performed at [facility], [address], [place], [place] [address]  Magnesium      Status: Abnormal   Collection Time: 12/18/23  3:36 AM  Result Value Ref Range   Magnesium  2.8 (H) 1.7 - 2.4 mg/dL    Comment: Performed at [facility], [address], [place], [place] [address]  Glucose, capillary     Status: Abnormal   Collection Time: 12/18/23  3:37 AM  Result Value Ref Range   Glucose-Capillary 132 (H) 70 - 99 mg/dL    Comment: Glucose reference range applies only to samples taken after fasting for at least 8 hours.  Glucose, capillary     Status: Abnormal   Collection Time: 12/18/23  7:16 AM  Result Value Ref Range   Glucose-Capillary 127 (H) 70 - 99 mg/dL    Comment: Glucose reference range applies only to samples taken after fasting for at least 8 hours.    Imaging / Studies: ECHOCARDIOGRAM COMPLETE Result Date: 12/18/2023    ECHOCARDIOGRAM REPORT   Patient Name:   Glenn Stewart Date of Exam: 12/17/2023 Medical Rec #:  [id number]        Height:       65.0 in Accession #:    [id number]       Weight:       228.8 lb Date of Birth:  June 09, 1965         BSA:          2.095 m Patient Age:    58 years         BP:           99/69 mmHg Patient Gender: M                HR:           95 bpm. Exam Location:  Inpatient Procedure: 2D Echo, Cardiac Doppler and Color Doppler (Both Spectral and Color            Flow Doppler were utilized during procedure). Indications:    Shock R57.9  History:        Patient has prior history of Echocardiogram examinations, most                 recent 05/10/2022. CAD and Angina, Prior CABG,                  Signs/Symptoms:Chest Pain; Risk Factors:Hypertension and                 Dyslipidemia.  Sonographer:    Thea Norlander RCS Referring Phys: VINA NOVAK SIMPSON IMPRESSIONS  1. Left ventricular ejection fraction, by estimation, is 50 to 55%. The left ventricle has low normal function. The left ventricle has no regional wall motion abnormalities. Left ventricular diastolic parameters were normal.  2. Right ventricular systolic function is normal. The right ventricular size is mildly enlarged. Tricuspid regurgitation signal is inadequate for assessing PA pressure.  3. The mitral valve is normal in structure. No evidence of mitral valve regurgitation. No evidence of mitral stenosis.  4. The aortic valve is normal in structure. Aortic valve regurgitation is not visualized. No aortic stenosis is present.  5. The inferior vena cava is normal in size with greater than 50% respiratory variability, suggesting right atrial pressure of 3 mmHg. FINDINGS  Left Ventricle: Left ventricular ejection fraction, by estimation,  is 50 to 55%. The left ventricle has low normal function. The left ventricle has no regional wall motion abnormalities. The left ventricular internal cavity size was normal in size. There is no left ventricular hypertrophy. Left ventricular diastolic parameters were normal. Right Ventricle: The right ventricular size is mildly enlarged. No increase in right ventricular wall thickness. Right ventricular systolic function is normal. Tricuspid regurgitation signal is inadequate for assessing PA pressure. Left Atrium: Left atrial size was normal in size. Right Atrium: Right atrial size was normal in size. Pericardium: There is no evidence of pericardial effusion. Mitral Valve: The mitral valve is normal in structure. No evidence of mitral valve regurgitation. No evidence of mitral valve stenosis. Tricuspid Valve: The tricuspid valve is normal in structure. Tricuspid valve regurgitation is trivial. No evidence of  tricuspid stenosis. Aortic Valve: The aortic valve is normal in structure. Aortic valve regurgitation is not visualized. No aortic stenosis is present. Aortic valve peak gradient measures 7.8 mmHg. Pulmonic Valve: The pulmonic valve was normal in structure. Pulmonic valve regurgitation is not visualized. No evidence of pulmonic stenosis. Aorta: The aortic root is normal in size and structure. Venous: The inferior vena cava is normal in size with greater than 50% respiratory variability, suggesting right atrial pressure of 3 mmHg. IAS/Shunts: No atrial level shunt detected by color flow Doppler.  LEFT VENTRICLE PLAX 2D LVIDd:         4.40 cm   Diastology LVIDs:         3.20 cm   LV e' medial:    8.92 cm/s LV PW:         1.10 cm   LV E/e' medial:  10.5 LV IVS:        1.10 cm   LV e' lateral:   20.40 cm/s LVOT diam:     2.40 cm   LV E/e' lateral: 4.6 LV SV:         73 LV SV Index:   35 LVOT Area:     4.52 cm  RIGHT VENTRICLE             IVC RV S prime:     10.70 cm/s  IVC diam: 2.00 cm TAPSE (M-mode): 1.7 cm LEFT ATRIUM             Index        RIGHT ATRIUM           Index LA diam:        4.00 cm 1.91 cm/m   RA Area:     14.80 cm LA Vol (A2C):   33.1 ml 15.80 ml/m  RA Volume:   29.80 ml  14.23 ml/m LA Vol (A4C):   28.4 ml 13.56 ml/m LA Biplane Vol: 31.2 ml 14.89 ml/m  AORTIC VALVE AV Area (Vmax): 3.36 cm AV Vmax:        140.00 cm/s AV Peak Grad:   7.8 mmHg LVOT Vmax:      104.00 cm/s LVOT Vmean:     70.200 cm/s LVOT VTI:       0.161 m  AORTA Ao Root diam: 3.50 cm Ao Asc diam:  3.40 cm MITRAL VALVE MV Area (PHT): 4.21 cm    SHUNTS MV Decel Time: 180 msec    Systemic VTI:  0.16 m MV E velocity: 93.30 cm/s  Systemic Diam: 2.40 cm MV A velocity: 72.80 cm/s MV E/A ratio:  1.28 Oneil Parchment MD Electronically signed by Oneil Parchment MD Signature Date/Time: 12/18/2023/7:04:53 AM  Final    DG CHEST PORT 1 VIEW Result Date: 12/17/2023 CLINICAL DATA:  Central line placement EXAM: PORTABLE CHEST 1 VIEW COMPARISON:   12/17/2023 FINDINGS: Introduction of a LEFT central venous line with tip in the brachycephalic vein. No pneumothorax Endotracheal tube, NG tube, and RIGHT central venous line are unchanged. Midline sternotomy.  Low lung volumes.  LEFT effusion. IMPRESSION: 1. LEFT central venous line with tip in the brachiocephalic vein. No pneumothorax. 2. Additional support apparatus unchanged. 3. Low lung volumes and LEFT effusion. Electronically Signed   By: Jackquline Boxer M.D.   On: 12/17/2023 16:55   CT GUIDED PERITONEAL/RETROPERITONEAL FLUID DRAIN BY PERC CATH Result Date: 12/17/2023 INDICATION: [id number] Abdominal fluid collection [phone]. Concern for bladder perforation EXAM: CT-GUIDED RIGHT LOWER QUADRANT ABSCESS DRAINAGE CATHETER PLACEMENT COMPARISON:  CT cystogram and CT AP, 12/16/2023. MEDICATIONS: The patient is currently admitted to the hospital and receiving intravenous antibiotics. The antibiotics were administered within an appropriate time frame prior to the initiation of the procedure. ANESTHESIA/SEDATION: Sedation by the ICU RN team was performed. Please see RN log for details. CONTRAST:  None FLUOROSCOPY TIME:  CT dose; 1011 mGycm COMPLICATIONS: None immediate. PROCEDURE: RADIATION DOSE REDUCTION: This exam was performed according to the departmental dose-optimization program which includes automated exposure control, adjustment of the mA and/or kV according to patient size and/or use of iterative reconstruction technique. Informed written consent was obtained from the patient and/or patient's representative after a discussion of the risks, benefits and alternatives to treatment. The patient was placed supine on the CT gantry and a pre procedural CT was performed re-demonstrating the known abscess/fluid collection within the RIGHT lower quad. The procedure was planned. A timeout was performed prior to the initiation of the procedure. The RIGHT lower quad was prepped and draped in the usual sterile fashion. The  overlying soft tissues were anesthetized with 1% lidocaine  with epinephrine . Appropriate trajectory was planned with the use of a 22 gauge spinal needle. An 18 gauge trocar needle was advanced into the abscess/fluid collection and a short Amplatz super stiff wire was coiled within the collection. Appropriate positioning was confirmed with a limited CT scan. The tract was serially dilated allowing placement of a 14 Fr Fr drainage catheter. Appropriate positioning was confirmed with a limited postprocedural CT scan. 30 mL of turbid fluid was aspirated. The tube was connected to a drainage bag and sutured in place. A dressing was placed. The patient tolerated the procedure well without immediate post procedural complication. IMPRESSION: Successful CT-guided placement of a 14 Fr drainage catheter into the RIGHT lower quadrant with aspiration of 30 mL mL of turbid fluid. Samples were sent to the laboratory as requested by the ordering clinical team. RECOMMENDATIONS: The patient will return to Vascular Interventional Radiology (VIR) for routine drainage catheter evaluation and exchange in 7-10 days. Thom Hall, MD [facility] Electronically Signed   By: Thom Hall M.D.   On: 12/17/2023 14:04   DG Chest Port 1 View Result Date: 12/17/2023 EXAM: 1 VIEW(S) XRAY OF THE CHEST 12/17/2023 05:48:00 AM COMPARISON: 12/16/2023 CLINICAL HISTORY: Respiratory failure; Hx of: chest pain, coronary artery disease; Chief complaint: Loss of consciousness FINDINGS: LINES, TUBES AND DEVICES: Right IJ central venous catheter in place with tip over the SVC. ETT in place with tip 3.7 cm above the carina. Enteric tube in place, courses below the diaphragm with tip obscured. Overlying defibrillator pads. LUNGS AND PLEURA: Low lung volumes. Mild right basilar atelectasis. Similar left retrocardiac airspace  opacity, likely atelectasis. No pulmonary edema. No pleural effusion. No  pneumothorax. HEART AND MEDIASTINUM: No acute abnormality of the cardiac and mediastinal silhouettes. BONES AND SOFT TISSUES: Median sternotomy and CABG changes noted. IMPRESSION: 1. Low lung volumes with mild basilar atelectasis/retrocardiac atelectasis. 2. Support devices appropriately positioned, including right IJ central venous catheter with tip in the SVC, endotracheal tube 3.7 cm above the carina, and enteric tube coursing below the diaphragm. Electronically signed by: Waddell Calk MD 12/17/2023 06:09 AM EDT RP Workstation: [id number]   CT CYSTOGRAM ABD/PELVIS Result Date: 12/16/2023 CLINICAL DATA:  Sepsis with concern for bladder perforation. EXAM: CT CYSTOGRAM (CT ABDOMEN AND PELVIS WITH CONTRAST) TECHNIQUE: Multi-detector CT imaging through the abdomen and pelvis was performed after dilute contrast had been introduced into the bladder for the purposes of performing CT cystography. RADIATION DOSE REDUCTION: This exam was performed according to the departmental dose-optimization program which includes automated exposure control, adjustment of the mA and/or kV according to patient size and/or use of iterative reconstruction technique. CONTRAST:  50mL OMNIPAQUE  IOHEXOL  300 MG/ML  SOLN COMPARISON:  12/16/2023 FINDINGS: Lower chest: Atelectasis or consolidation in both lung bases. Hepatobiliary: Cholelithiasis.  No bile duct dilatation. Pancreas: Unremarkable. No pancreatic ductal dilatation or surrounding inflammatory changes. Spleen: Normal in size without focal abnormality. Adrenals/Urinary Tract: Left adrenal gland nodules. See previous description from prior CT earlier today. Kidneys are symmetrical. No hydronephrosis or hydroureter. Contrast material fills the bladder. Foley catheter in the bladder. No bladder wall thickening. Contrast material is demonstrated leaking along the catheter through the urethra to the distal penile region. There is focal irregularity at the junction of the urethra with the  bladder base with small anterior contrast pooling suggesting contained leak. No contrast extravasation to suggest intraperitoneal leakage. Remainder the urethra appears otherwise intact. Stomach/Bowel: Enteric tube tip in the stomach. Dilated fluid-filled small bowel demonstrates some decompression since previous study. Colon is decompressed. Persistent wall thickening in the terminal ileum and colon. Vascular/Lymphatic: Aortic atherosclerosis. No enlarged abdominal or pelvic lymph nodes. Reproductive: Prostate gland is surgically absent. No significant pelvic lymphadenopathy. Other: Multiple loculated fluid collections demonstrated in the right abdomen and pelvis as previously discussed. Small amount of free fluid. Diffuse stranding throughout the intra-abdominal fat. Changes are consistent with multiple abscesses. Musculoskeletal: No acute bony abnormalities. No focal bone lesions. IMPRESSION: 1. CT cystogram with Foley catheter in place. Contrast material leaks along the catheter through the urethra. There is focal irregularity at the junction of the bladder base and urethra with small anterior contrast pooling suggesting contained perforation. No intraperitoneal contrast extravasation. 2. Appearance examination is otherwise unchanged. Again demonstrated are multiple loculated fluid collections in the abdomen and pelvis consistent with abscesses. Wall thickening of the cecum and terminal ileum. Fluid-filled dilated small bowel appear mildly improved. Electronically Signed   By: Elsie Gravely M.D.   On: 12/16/2023 22:41   CT Angio Chest Pulmonary Embolism (PE) W or WO Contrast Result Date: 12/16/2023 CLINICAL DATA:  Aortic aneurysm suspected. Severe right-sided abdominal pain and abdominal distention. Decreased oxygen saturation. EXAM: CT ANGIOGRAPHY CHEST CT ABDOMEN AND PELVIS WITH CONTRAST TECHNIQUE: Multidetector CT imaging of the chest was performed using the standard protocol during bolus administration  of intravenous contrast. Multiplanar CT image reconstructions and MIPs were obtained to evaluate the vascular anatomy. Multidetector CT imaging of the abdomen and pelvis was performed using the standard protocol during bolus administration of intravenous contrast. RADIATION DOSE REDUCTION: This exam was performed according to the departmental dose-optimization program which includes automated  exposure control, adjustment of the mA and/or kV according to patient size and/or use of iterative reconstruction technique. CONTRAST:  75mL OMNIPAQUE  IOHEXOL  350 MG/ML SOLN COMPARISON:  Chest radiograph 12/16/2023.  PET-CT 06/23/2023 FINDINGS: CTA CHEST FINDINGS Cardiovascular: Cardiac enlargement. No pericardial effusions. Normal caliber thoracic aorta. No aortic dissection. Calcification of the aorta and coronary arteries. Great vessel origins are patent. Postoperative changes consistent with coronary bypass. No evidence of significant pulmonary embolus. Mediastinum/Nodes: Enteric tube with tip in the stomach. Esophagus is decompressed. Endotracheal tube with tip above the carina. Thyroid gland is unremarkable. Scattered mediastinal lymph nodes are not pathologically enlarged, likely reactive. Lungs/Pleura: Consolidation or atelectasis in both lung bases, possibly pneumonia, compressive atelectasis, or aspiration. Small bilateral pleural effusions. No pneumothorax. Musculoskeletal: Degenerative changes in the spine. Sternotomy wires. No acute bony abnormalities. Review of the MIP images confirms the above findings. CT ABDOMEN and PELVIS FINDINGS Hepatobiliary: No focal liver lesions. Cholelithiasis with several stones in the gallbladder. No wall thickening or inflammatory stranding. No bile duct dilatation. Pancreas: Unremarkable. No pancreatic ductal dilatation or surrounding inflammatory changes. Spleen: Normal in size without focal abnormality. Adrenals/Urinary Tract: Left adrenal gland nodule measuring 2.1 cm diameter.  Sixty-five Hounsfield unit density measurement. The lesions are unchanged since prior unenhanced study from 12/18/2020 at which time the density measurement was -3 Hounsfield units. This is consistent with a benign fat containing adenoma and no imaging follow-up is indicated. Kidneys are symmetrical and homogeneous. No hydronephrosis or hydroureter. Bladder is decompressed. Stomach/Bowel: Stomach is unremarkable. Colon is decompressed. Wall thickening of the ascending colon and hepatic flexure, new since prior study. This may indicate colitis or inflammatory bowel disease versus reactive change. Dilated fluid-filled small bowel with air-fluid levels and mild diffuse wall thickening. Terminal ileum is decompressed. This is consistent with small-bowel obstruction, possibly due to inflammatory strictures. Vascular/Lymphatic: Aortic atherosclerosis. No enlarged abdominal or pelvic lymph nodes. Reproductive: Prostate is unremarkable. Other: Diffuse infiltration throughout the right-sided abdominal and pelvic mesenteric fat. Multiple loculated fluid collections are demonstrated throughout the right abdomen and pelvis. A right pelvic collection measures 7.4 cm diameter. A right anterior lower quadrant collection measures 8.8 cm diameter. A right anterior para psoas collection measures 9.4 cm diameter. Multiple smaller right upper quadrant collections are demonstrated. Some collections contains small gas bubbles. This appearance is likely indicate multiple abscesses, possibly due to perforation. Could also consider a perforated appendicitis. Small amount of free fluid in the abdomen, likely ascites or reactive fluid. No free intra-abdominal gas. Musculoskeletal: Degenerative changes in the spine. No acute bony abnormalities. Review of the MIP images confirms the above findings. IMPRESSION: 1. Fluid-filled distended small bowel with decompressed terminal ileum consistent with small-bowel obstruction. 2. Irregular wall  thickening diffusely throughout the distal small bowel and right colon suggesting enterocolitis or inflammatory bowel disease. Inflammatory strictures may be present. 3. Multiple loculated fluid collections, some with gas, demonstrated in the right abdomen and pelvis indicating abscesses. This is likely due to perforation, possibly related to inflammatory bowel disease or to an occult perforated appendicitis. The appendix is self is not demonstrated. No free intra-abdominal air. 4. Small amount of free fluid in the abdomen is likely reactive. 5. Cholelithiasis without evidence of acute cholecystitis. 6. Aortic atherosclerosis. 7. Consolidation versus compressive atelectasis in both lung bases. Small bilateral pleural effusions. Critical Value/emergent results were called by telephone at the time of interpretation on 12/16/2023 at 5:28 pm to provider MATTHEW TRIFAN , who verbally acknowledged these results. Electronically Signed   By: Elsie Gravely  M.D.   On: 12/16/2023 17:33   CT ABDOMEN PELVIS W CONTRAST Result Date: 12/16/2023 CLINICAL DATA:  Aortic aneurysm suspected. Severe right-sided abdominal pain and abdominal distention. Decreased oxygen saturation. EXAM: CT ANGIOGRAPHY CHEST CT ABDOMEN AND PELVIS WITH CONTRAST TECHNIQUE: Multidetector CT imaging of the chest was performed using the standard protocol during bolus administration of intravenous contrast. Multiplanar CT image reconstructions and MIPs were obtained to evaluate the vascular anatomy. Multidetector CT imaging of the abdomen and pelvis was performed using the standard protocol during bolus administration of intravenous contrast. RADIATION DOSE REDUCTION: This exam was performed according to the departmental dose-optimization program which includes automated exposure control, adjustment of the mA and/or kV according to patient size and/or use of iterative reconstruction technique. CONTRAST:  75mL OMNIPAQUE  IOHEXOL  350 MG/ML SOLN COMPARISON:   Chest radiograph 12/16/2023.  PET-CT 06/23/2023 FINDINGS: CTA CHEST FINDINGS Cardiovascular: Cardiac enlargement. No pericardial effusions. Normal caliber thoracic aorta. No aortic dissection. Calcification of the aorta and coronary arteries. Great vessel origins are patent. Postoperative changes consistent with coronary bypass. No evidence of significant pulmonary embolus. Mediastinum/Nodes: Enteric tube with tip in the stomach. Esophagus is decompressed. Endotracheal tube with tip above the carina. Thyroid gland is unremarkable. Scattered mediastinal lymph nodes are not pathologically enlarged, likely reactive. Lungs/Pleura: Consolidation or atelectasis in both lung bases, possibly pneumonia, compressive atelectasis, or aspiration. Small bilateral pleural effusions. No pneumothorax. Musculoskeletal: Degenerative changes in the spine. Sternotomy wires. No acute bony abnormalities. Review of the MIP images confirms the above findings. CT ABDOMEN and PELVIS FINDINGS Hepatobiliary: No focal liver lesions. Cholelithiasis with several stones in the gallbladder. No wall thickening or inflammatory stranding. No bile duct dilatation. Pancreas: Unremarkable. No pancreatic ductal dilatation or surrounding inflammatory changes. Spleen: Normal in size without focal abnormality. Adrenals/Urinary Tract: Left adrenal gland nodule measuring 2.1 cm diameter. Sixty-five Hounsfield unit density measurement. The lesions are unchanged since prior unenhanced study from 12/18/2020 at which time the density measurement was -3 Hounsfield units. This is consistent with a benign fat containing adenoma and no imaging follow-up is indicated. Kidneys are symmetrical and homogeneous. No hydronephrosis or hydroureter. Bladder is decompressed. Stomach/Bowel: Stomach is unremarkable. Colon is decompressed. Wall thickening of the ascending colon and hepatic flexure, new since prior study. This may indicate colitis or inflammatory bowel disease  versus reactive change. Dilated fluid-filled small bowel with air-fluid levels and mild diffuse wall thickening. Terminal ileum is decompressed. This is consistent with small-bowel obstruction, possibly due to inflammatory strictures. Vascular/Lymphatic: Aortic atherosclerosis. No enlarged abdominal or pelvic lymph nodes. Reproductive: Prostate is unremarkable. Other: Diffuse infiltration throughout the right-sided abdominal and pelvic mesenteric fat. Multiple loculated fluid collections are demonstrated throughout the right abdomen and pelvis. A right pelvic collection measures 7.4 cm diameter. A right anterior lower quadrant collection measures 8.8 cm diameter. A right anterior para psoas collection measures 9.4 cm diameter. Multiple smaller right upper quadrant collections are demonstrated. Some collections contains small gas bubbles. This appearance is likely indicate multiple abscesses, possibly due to perforation. Could also consider a perforated appendicitis. Small amount of free fluid in the abdomen, likely ascites or reactive fluid. No free intra-abdominal gas. Musculoskeletal: Degenerative changes in the spine. No acute bony abnormalities. Review of the MIP images confirms the above findings. IMPRESSION: 1. Fluid-filled distended small bowel with decompressed terminal ileum consistent with small-bowel obstruction. 2. Irregular wall thickening diffusely throughout the distal small bowel and right colon suggesting enterocolitis or inflammatory bowel disease. Inflammatory strictures may be present. 3. Multiple loculated fluid collections, some  with gas, demonstrated in the right abdomen and pelvis indicating abscesses. This is likely due to perforation, possibly related to inflammatory bowel disease or to an occult perforated appendicitis. The appendix is self is not demonstrated. No free intra-abdominal air. 4. Small amount of free fluid in the abdomen is likely reactive. 5. Cholelithiasis without evidence  of acute cholecystitis. 6. Aortic atherosclerosis. 7. Consolidation versus compressive atelectasis in both lung bases. Small bilateral pleural effusions. Critical Value/emergent results were called by telephone at the time of interpretation on 12/16/2023 at 5:28 pm to provider MATTHEW TRIFAN , who verbally acknowledged these results. Electronically Signed   By: Elsie Gravely M.D.   On: 12/16/2023 17:33   CT HEAD WO CONTRAST Result Date: 12/16/2023 CLINICAL DATA:  Altered mental status. EXAM: CT HEAD WITHOUT CONTRAST TECHNIQUE: Contiguous axial images were obtained from the base of the skull through the vertex without intravenous contrast. RADIATION DOSE REDUCTION: This exam was performed according to the departmental dose-optimization program which includes automated exposure control, adjustment of the mA and/or kV according to patient size and/or use of iterative reconstruction technique. COMPARISON:  May 10, 2022 FINDINGS: Brain: No evidence of acute infarction, hemorrhage, hydrocephalus, extra-axial collection or mass lesion/mass effect. There are areas of mildly decreased attenuation within the white matter tracts of the supratentorial brain, consistent with microvascular disease changes. Vascular: No hyperdense vessel or unexpected calcification. Skull: Normal. Negative for fracture or focal lesion. Sinuses/Orbits: No acute finding. Other: None. IMPRESSION: No acute intracranial pathology. Electronically Signed   By: Suzen Dials M.D.   On: 12/16/2023 17:17   DG Chest Port 1 View Result Date: 12/16/2023 CLINICAL DATA:  [id number] Encounter for central line placement [id number] EXAM: PORTABLE CHEST - 1 VIEW COMPARISON:  12/16/2023, 07/16/2023 FINDINGS: Similarly positioned endotracheal and esophagogastric tubes. Interval placement of a right IJ approach central venous catheter, which terminates at the cavoatrial junction. Improved aeration of the lungs. Retrocardiac airspace opacities. No pleural  effusion or pneumothorax. No cardiomegaly. Sternotomy wires and CABG markers. Defibrillator pads. IMPRESSION: 1. Interval placement of a right IJ approach central venous catheter, which terminates at the cavoatrial junction. No pneumothorax. 2. Improved aeration of the lungs with retrocardiac airspace opacities, possibly atelectasis or aspiration. Electronically Signed   By: Rogelia Myers M.D.   On: 12/16/2023 16:28   DG Chest Port 1 View Result Date: 12/16/2023 CLINICAL DATA:  Central line and ET tube placement EXAM: PORTABLE CHEST - 1 VIEW COMPARISON:  July 16, 2023 FINDINGS: Endotracheal tube terminates in the mid trachea. Esophagogastric tube terminates in the region of the stomach. Overlying defibrillator pads. Markedly low lung volumes. No focal airspace consolidation, pleural effusion, or pneumothorax. No cardiomegaly. IMPRESSION: 1. Well-positioned endotracheal and esophagogastric tubes. No pneumothorax. 2. Markedly low lung volumes.  No pneumonia or pleural effusion. Electronically Signed   By: Rogelia Myers M.D.   On: 12/16/2023 16:26    Medications / Allergies: per chart  Antibiotics: Anti-infectives (From admission, onward)    Start     Dose/Rate Route Frequency Ordered Stop   12/17/23 1600  piperacillin -tazobactam (ZOSYN ) IVPB 2.25 g  Status:  Discontinued        2.25 g 100 mL/hr over 30 Minutes Intravenous Every 8 hours 12/17/23 0931 12/17/23 1144   12/17/23 1600  piperacillin -tazobactam (ZOSYN ) IVPB 3.375 g        3.375 g 100 mL/hr over 30 Minutes Intravenous Every 6 hours 12/17/23 1157     12/17/23 1230  piperacillin -tazobactam (ZOSYN ) IVPB 3.375 g  Status:  Discontinued        3.375 g 100 mL/hr over 30 Minutes Intravenous Every 6 hours 12/17/23 1144 12/17/23 1157   12/17/23 1000  linezolid  (ZYVOX ) IVPB 600 mg        600 mg 300 mL/hr over 60 Minutes Intravenous Every 12 hours 12/16/23 1924     12/17/23 0000  piperacillin -tazobactam (ZOSYN ) IVPB 3.375 g  Status:   Discontinued        3.375 g 12.5 mL/hr over 240 Minutes Intravenous Every 8 hours 12/16/23 1924 12/17/23 0931   12/16/23 1600  piperacillin -tazobactam (ZOSYN ) IVPB 3.375 g        3.375 g 12.5 mL/hr over 240 Minutes Intravenous  Once 12/16/23 1551 12/16/23 1602   12/16/23 1600  vancomycin  (VANCOREADY) IVPB 1750 mg/350 mL        1,750 mg 175 mL/hr over 120 Minutes Intravenous  Once 12/16/23 1551 12/16/23 1816         Note: Portions of this report may have been transcribed using voice recognition software. Every effort was made to ensure accuracy; however, inadvertent computerized transcription errors may be present.   Any transcriptional errors that result from this process are unintentional.    Elspeth KYM Schultze, MD, FACS, MASCRS Esophageal, Gastrointestinal & Colorectal Surgery Robotic and Minimally Invasive Surgery  [facility] [address] [place], [place] [address] [phone] Fax [phone] Main  CONTACT INFORMATION: Weekday (9AM-5PM): Call [facility] main office at [phone] Weeknight (5PM-9AM) or Weekend/Holiday: Check EPIC Web Links tab & use AMION (password  [email or web address]) for General Surgery [facility] coverage  Please, DO NOT use SecureChat  (it is not reliable communication to reach operating surgeons & will lead to a delay in care).   Epic staff messaging available for outptient concerns needing 1-2 business day response.      12/18/2023  7:35 AM

## 2023-12-18 NOTE — Progress Notes (Signed)
 Patient has order to remove arterial line. Patient's blood pressures via the arterial line are borderline while beginning to pull fluid and just having come off of vasopressors. Spoke with CCMD, Ramaswamy, who gave verbal order to keep arterial line until tomorrow and remove then if blood pressure is stable overnight.

## 2023-12-19 ENCOUNTER — Inpatient Hospital Stay (HOSPITAL_COMMUNITY)

## 2023-12-19 DIAGNOSIS — I480 Paroxysmal atrial fibrillation: Secondary | ICD-10-CM

## 2023-12-19 DIAGNOSIS — I4891 Unspecified atrial fibrillation: Secondary | ICD-10-CM

## 2023-12-19 DIAGNOSIS — R579 Shock, unspecified: Secondary | ICD-10-CM | POA: Diagnosis not present

## 2023-12-19 LAB — RENAL FUNCTION PANEL
Albumin: <1.5 g/dL — ABNORMAL LOW (ref 3.5–5.0)
Albumin: <1.5 g/dL — ABNORMAL LOW (ref 3.5–5.0)
Anion gap: 11 (ref 5–15)
Anion gap: 12 (ref 5–15)
BUN: 47 mg/dL — ABNORMAL HIGH (ref 6–20)
BUN: 45 mg/dL — ABNORMAL HIGH (ref 6–20)
CO2: 21 mmol/L — ABNORMAL LOW (ref 22–32)
CO2: 22 mmol/L (ref 22–32)
Calcium: 7.7 mg/dL — ABNORMAL LOW (ref 8.9–10.3)
Calcium: 7.6 mg/dL — ABNORMAL LOW (ref 8.9–10.3)
Chloride: 99 mmol/L (ref 98–111)
Chloride: 97 mmol/L — ABNORMAL LOW (ref 98–111)
Creatinine, Ser: 2.99 mg/dL — ABNORMAL HIGH (ref 0.61–1.24)
Creatinine, Ser: 2.74 mg/dL — ABNORMAL HIGH (ref 0.61–1.24)
GFR, Estimated: 23 mL/min — ABNORMAL LOW (ref >60)
GFR, Estimated: 26 mL/min — ABNORMAL LOW (ref >60)
Glucose, Bld: 141 mg/dL — ABNORMAL HIGH (ref 70–99)
Glucose, Bld: 100 mg/dL — ABNORMAL HIGH (ref 70–99)
Phosphorus: 3.3 mg/dL (ref 2.5–4.6)
Phosphorus: 3.4 mg/dL (ref 2.5–4.6)
Potassium: 4.2 mmol/L (ref 3.5–5.1)
Potassium: 4.5 mmol/L (ref 3.5–5.1)
Sodium: 131 mmol/L — ABNORMAL LOW (ref 135–145)
Sodium: 131 mmol/L — ABNORMAL LOW (ref 135–145)

## 2023-12-19 LAB — CBC
HCT: 29.6 % — ABNORMAL LOW (ref 39.0–52.0)
Hemoglobin: 10.5 g/dL — ABNORMAL LOW (ref 13.0–17.0)
MCH: 27.6 pg (ref 26.0–34.0)
MCHC: 35.5 g/dL (ref 30.0–36.0)
MCV: 77.9 fL — ABNORMAL LOW (ref 80.0–100.0)
Platelets: 258 K/uL (ref 150–400)
RBC: 3.8 MIL/uL — ABNORMAL LOW (ref 4.22–5.81)
RDW: 14.9 % (ref 11.5–15.5)
WBC: 12.9 K/uL — ABNORMAL HIGH (ref 4.0–10.5)
nRBC: 0 % (ref 0.0–0.2)

## 2023-12-19 LAB — POCT I-STAT 7, (LYTES, BLD GAS, ICA,H+H)
Acid-base deficit: 1 mmol/L (ref 0.0–2.0)
Bicarbonate: 24 mmol/L (ref 20.0–28.0)
Calcium, Ion: 1.06 mmol/L — ABNORMAL LOW (ref 1.15–1.40)
HCT: 31 % — ABNORMAL LOW (ref 39.0–52.0)
Hemoglobin: 10.5 g/dL — ABNORMAL LOW (ref 13.0–17.0)
O2 Saturation: 95 %
Patient temperature: 37
Potassium: 4.2 mmol/L (ref 3.5–5.1)
Sodium: 132 mmol/L — ABNORMAL LOW (ref 135–145)
TCO2: 25 mmol/L (ref 22–32)
pCO2 arterial: 40.3 mmHg (ref 32–48)
pH, Arterial: 7.383 (ref 7.35–7.45)
pO2, Arterial: 78 mmHg — ABNORMAL LOW (ref 83–108)

## 2023-12-19 LAB — URINE CULTURE: Culture: 80000 — AB

## 2023-12-19 LAB — GLUCOSE, CAPILLARY
Glucose-Capillary: 141 mg/dL — ABNORMAL HIGH (ref 70–99)
Glucose-Capillary: 129 mg/dL — ABNORMAL HIGH (ref 70–99)
Glucose-Capillary: 109 mg/dL — ABNORMAL HIGH (ref 70–99)
Glucose-Capillary: 96 mg/dL (ref 70–99)
Glucose-Capillary: 94 mg/dL (ref 70–99)
Glucose-Capillary: 96 mg/dL (ref 70–99)

## 2023-12-19 LAB — HEPATIC FUNCTION PANEL
ALT: 34 U/L (ref 0–44)
AST: 62 U/L — ABNORMAL HIGH (ref 15–41)
Albumin: <1.5 g/dL — ABNORMAL LOW (ref 3.5–5.0)
Alkaline Phosphatase: 116 U/L (ref 38–126)
Bilirubin, Direct: 1.3 mg/dL — ABNORMAL HIGH (ref 0.0–0.2)
Indirect Bilirubin: 1 mg/dL — ABNORMAL HIGH (ref 0.3–0.9)
Total Bilirubin: 2.3 mg/dL — ABNORMAL HIGH (ref 0.0–1.2)
Total Protein: 5.6 g/dL — ABNORMAL LOW (ref 6.5–8.1)

## 2023-12-19 LAB — MAGNESIUM: Magnesium: 3 mg/dL — ABNORMAL HIGH (ref 1.7–2.4)

## 2023-12-19 MED ORDER — SODIUM CHLORIDE 0.9 % IV SOLN
2.0000 g | Freq: Two times a day (BID) | INTRAVENOUS | Status: DC
  Administered 2023-12-19 – 2023-12-20 (×4): 2 g via INTRAVENOUS
  Filled 2023-12-19 (×4): qty 12.5

## 2023-12-19 MED ORDER — DIATRIZOATE MEGLUMINE & SODIUM 66-10 % PO SOLN
90.0000 mL | Freq: Once | ORAL | Status: AC
  Administered 2023-12-19: 90 mL via NASOGASTRIC
  Filled 2023-12-19: qty 90

## 2023-12-19 MED ORDER — METRONIDAZOLE 500 MG/100ML IV SOLN
500.0000 mg | Freq: Two times a day (BID) | INTRAVENOUS | Status: DC
Start: 2023-12-19 — End: 2023-12-21
  Administered 2023-12-19 – 2023-12-20 (×4): 500 mg via INTRAVENOUS
  Filled 2023-12-19 (×4): qty 100

## 2023-12-19 NOTE — Plan of Care (Signed)
 Ongoing dialysis. Aneuric. Keep foley catheter. No change to plan of care

## 2023-12-19 NOTE — Progress Notes (Signed)
 Gastric residuals after 6 hrs of trickle feeds was 175 mL's. Residuals returned and will continue with trickle feeds and re-check in 6 hrs/PRN as per order. (Next check is at 0900).

## 2023-12-19 NOTE — Progress Notes (Signed)
 Paged Surgery/Trauma MD to report results of Abd Scan (SBO protocol). Advised to maintain current plan of care and await further evaluation in the morning.

## 2023-12-19 NOTE — Progress Notes (Addendum)
  Progress Note  Patient Name: IMANI SHERRIN Date of Encounter: 12/19/2023 [facility] Cardiologist: Redell Shallow, MD   Interval Summary   Currently on IV amiodarone  Remains intubated and sedated, on pressors  HR and BP have remained stable Followed closely by general surgery, nephrology, urology  Vital Signs Vitals:   12/19/23 0900 12/19/23 0905 12/19/23 0913 12/19/23 0946  BP:      Pulse: 78 78 86 80  Resp: (!) 28 (!) 28 (!) 28 (!) 25  Temp: 98.6 F (37 C) 98.6 F (37 C) 98.6 F (37 C) 98.6 F (37 C)  TempSrc:      SpO2: 96% 96% 96% 97%  Weight:      Height:        Intake/Output Summary (Last 24 hours) at 12/19/2023 1006 Last data filed at 12/19/2023 1000 Gross per 24 hour  Intake 4938.59 ml  Output 7416 ml  Net -2477.41 ml      12/19/2023    2:47 AM 12/18/2023    1:24 AM 12/17/2023    4:14 AM  Last 3 Weights  Weight (lbs) 225 lb 5 oz 228 lb 2.8 oz 228 lb 13.4 oz  Weight (kg) 102.2 kg 103.5 kg 103.8 kg     Telemetry/ECG  Sinus rhythm, HR 70s- Personally Reviewed  Physical Exam  GEN: intubated and sedated    Neck: No JVD Cardiac: RRR, no murmurs, rubs, or gallops.  Respiratory: ventilated.  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 95 Suspect demand ischemia in the setting of critical illness  Low suspicion for ACS this admission   Per primary Septic shock Recent radial prostatectomy 10/2023  History of  prostate cancer Abdominal abscess  Urinoma  SBO  Acute hypoxic and hypercarbic respiratory failure PNA AKI, currently on CRRT Acute on chronic anemia  Elevated LFTs   For questions or updates, please contact [facility] Please consult [email or web address] for contact info under        Signed, Waddell DELENA Donath, PA-C

## 2023-12-19 NOTE — Progress Notes (Signed)
 Initial Nutrition Assessment  DOCUMENTATION CODES:  Not applicable  INTERVENTION:  When able, resume tube feeding via OGT. Recommend the following: Vital 1.5 at 60 ml/h (1440 ml per day) Start at 20 and advance by 10mL every 12 hours to reach goal Prosource TF20 60 ml 1x/d Provides 2240 kcal, 117 gm protein, 1100 ml free water  daily MVI with minerals daily when able to give enteral meds  NUTRITION DIAGNOSIS:  Increased nutrient needs related to acute illness as evidenced by estimated needs.  GOAL:  Patient will meet greater than or equal to 90% of their needs  MONITOR:  TF tolerance, Vent status, Labs  REASON FOR ASSESSMENT:  Consult Assessment of nutrition requirement/status (CRRT)  ASSESSMENT:  Pt with hx of CAD s/p CABGx5, HTN, HLD, GERD, atrial fibrillation, NASH, and s/p recent robotic assisted laparoscopic radial prostatectomy on 11/17/23 presented to ED from a urology appointment with poor level of consciousness.  9/26 - presented to ED, intubated, cardioversion 9/27 - drain placed in IR to RLQ, CRRT initiated 9/28 - trickles initiated 9/29 - TF held - SBO protocol ordered   Patient is currently intubated on ventilator support. No family in room this AM to provide a hx. On exam, pt appears well nourished.   Pt discussed during ICU rounds and with RN and MD. OGT currently clamped after giving gastrografin. Will discontinue current TF formula and place recommendations for when able to restart feeds.   MV: 13.7 L/min Temp (24hrs), Avg:98.4 F (36.9 C), Min:97.7 F (36.5 C), Max:98.8 F (37.1 C) MAP (Art Line): 65-81mmHg  Propofol : 9.34 ml/hr  Admit weight: 103.8 kg   Current weight: 102.2 kg  No weight loss noted in hx, gain over the last ~ 1 year   Intake/Output Summary (Last 24 hours) at 12/19/2023 1525 Last data filed at 12/19/2023 1441 Gross per 24 hour  Intake 4787.68 ml  Output 6776 ml  Net -1988.32 ml  Net IO Since Admission: 1,147.73 mL [12/19/23  1525]  Drains/Lines: CVC Right IJ, triple lumen Art line right radial Non tunneled left IJ, temporary HD line, triple lumen OGT 18 Fr. this AM Drain, RLQ, 14 Fr. 30mL this AM UOP 5mL out today  Nutritionally Relevant Medications: Scheduled Meds:  bisacodyl   10 mg Rectal Daily   diatrizoate meglumine-sodium  90 mL Per NG tube Once   VITAL HIGH PROTEIN  1,000 mL Per Tube Q24H   insulin  aspart  0-15 Units Subcutaneous Q4H   pantoprazole  IV  40 mg Intravenous Daily   Continuous Infusions:  dextrose  5% lactated ringers  100 mL/hr at 12/19/23 0900   linezolid  (ZYVOX ) IV Stopped (12/18/23 2344)   piperacillin -tazobactam (ZOSYN )  IV 3.375 g (12/19/23 0902)   propofol  (DIPRIVAN ) infusion 15 mcg/kg/min (12/19/23 0900)   Labs Reviewed: Sodium 131 BUN 47, creatinine 2.99 Magnesium  3.0 CBG ranges from 122-151 mg/dL over the last 24 hours HgbA1c 6.0% (9/27)  NUTRITION - FOCUSED PHYSICAL EXAM: Flowsheet Row Most Recent Value  Orbital Region No depletion  Upper Arm Region No depletion  Thoracic and Lumbar Region No depletion  Buccal Region No depletion  Temple Region No depletion  Clavicle Bone Region No depletion  Clavicle and Acromion Bone Region No depletion  Scapular Bone Region No depletion  Dorsal Hand No depletion  Patellar Region No depletion  Anterior Thigh Region No depletion  Posterior Calf Region No depletion  Edema (RD Assessment) Moderate  Hair Reviewed  Eyes Reviewed  Mouth Reviewed  Skin Reviewed  Nails Reviewed    Diet  Order:   Diet Order             Diet NPO time specified  Diet effective now                   EDUCATION NEEDS:  Not appropriate for education at this time  Skin:  Skin Assessment: Reviewed RN Assessment  Last BM:  9/26  Height:  Ht Readings from Last 1 Encounters:  12/17/23 5' 5 (1.651 m)    Weight:  Wt Readings from Last 1 Encounters:  12/19/23 102.2 kg    Ideal Body Weight:  61.8 kg  BMI:  Body mass index is  37.49 kg/m.  Estimated Nutritional Needs:  Kcal:  2000-2200 kcal/d Protein:  110-125g/d Fluid:  2L/d    Vernell Lukes, RD, LDN, CNSC Registered Dietitian II Please reach out via secure chat

## 2023-12-19 NOTE — Progress Notes (Signed)
 eLink Physician-Brief Progress Note Patient Name: Glenn Stewart DOB: 11/08/1965 MRN: [id number]   Date of Service  12/19/2023  HPI/Events of Note  Bedside RN called to report that the patient's Abd scan for SBO protocol is back. She wanted you to review it and see if there was anything else we need to do/ order. The test was ordered by Surgery- looks like they suspected an ileus. TF already on hold.  DG US : 1. Oral contrast pooling in the gastric fundus. No definite contrast noted in the small bowel or colon. 2. Dilated air-filled loops of small bowel.  On CRRT K/mag good. No air fluid level on ultrasound.    eICU Interventions  Keep NPO for now.  Continue surgery advice.      Intervention Category Intermediate Interventions: Other:  Jodelle ONEIDA Hutching 12/19/2023, 9:49 PM

## 2023-12-19 NOTE — Progress Notes (Signed)
 Referring Physician(s): Dr. Geronimo  Supervising Physician: Philip Cornet  Patient Status:  [facility] - In-pt  Chief Complaint: Right lower quadrant fluid collection  Subjective: Intubated, sedated.  On CRRT Significant serous vs. Urine output from RLQ drain placed 9/27 concerning for urinoma. CT Cystogram with + contained leak at anastomosis. Cr value on fluid not consistent with urinoma. Output decreased today to 35 mL.   Allergies: Sulfonamide derivatives  Medications: Prior to Admission medications   Medication Sig Start Date End Date Taking? Authorizing Provider  acetaminophen  (TYLENOL ) 325 MG tablet Take 2 tablets (650 mg total) by mouth every 4 (four) hours as needed for moderate pain or headache. 05/19/22   Zimmerman, Donielle M, PA-C  amLODipine  (NORVASC ) 5 MG tablet Take 1 tablet (5 mg total) by mouth daily. 12/07/23   Pietro Redell RAMAN, MD  atorvastatin  (LIPITOR) 80 MG tablet Take 1 tablet (80 mg total) by mouth daily. 12/07/23   Pietro Redell RAMAN, MD  ciprofloxacin  (CIPRO ) 500 MG tablet Take 1 tablet (500 mg total) by mouth 2 (two) times daily. Start day prior to follow up appt for catheter removal 11/17/23   Cory Palma, PA-C  docusate sodium  (COLACE) 100 MG capsule Take 1 capsule (100 mg total) by mouth 2 (two) times daily. 11/17/23   Cory Palma, PA-C  isosorbide  mononitrate (IMDUR ) 30 MG 24 hr tablet Take 1 tablet (30 mg total) by mouth daily. 12/07/23   Pietro Redell RAMAN, MD  metoprolol  tartrate (LOPRESSOR ) 25 MG tablet TAKE ONE TABLET BY MOUTH TWICE DAILY 11/23/23   Goodrich, Callie E, PA-C  pantoprazole  (PROTONIX ) 20 MG tablet Take 1 tablet (20 mg total) by mouth daily. 12/07/23 01/06/24  Pietro Redell RAMAN, MD  sertraline  (ZOLOFT ) 100 MG tablet Take 100 mg by mouth daily.    [provider]  tamsulosin  (FLOMAX ) 0.4 MG CAPS capsule Take 0.4 mg by mouth daily after supper.    [provider]  traMADol  (ULTRAM ) 50 MG tablet Take 1-2 tablets (50-100 mg  total) by mouth every 6 (six) hours as needed for moderate pain (pain score 4-6) or severe pain (pain score 7-10). 11/17/23   Cory Palma, PA-C     Vital Signs: BP 119/68   Pulse 75   Temp 98.4 F (36.9 C)   Resp 20   Ht 5' 5 (1.651 m)   Wt 225 lb 5 oz (102.2 kg)   SpO2 96%   BMI 37.49 kg/m   Physical Exam NAD, alert Abdomen: distended. RLQ drain in place.  Insertion site stable.  Drain open to drainage with only a small amount of serous, yellow output in gravity bag.  Imaging: DG CHEST PORT 1 VIEW Result Date: 12/19/2023 CLINICAL DATA:  Respiratory failure. EXAM: PORTABLE CHEST 1 VIEW COMPARISON:  12/17/2023. FINDINGS: Endotracheal tube tip terminates approximately 4 cm above the carina. Similarly position right IJ CVC catheter tip in the right atrium and the left CVC catheter tip overlying the brachiocephalic vein. No pneumothorax. Stable cardiomediastinal contours status post median sternotomy and CABG. Similar small left pleural effusion. IMPRESSION: 1. Similarly position life-support apparatus, as above. 2. Small left pleural effusion, not significantly changed. Electronically Signed   By: Harrietta Sherry M.D.   On: 12/19/2023 08:21   ECHOCARDIOGRAM COMPLETE Result Date: 12/18/2023    ECHOCARDIOGRAM REPORT   Patient Name:   Glenn Stewart Date of Exam: 12/17/2023 Medical Rec #:  [id number]        Height:       65.0 in Accession #:  [id number]       Weight:       228.8 lb Date of Birth:  1966/01/23         BSA:          2.095 m Patient Age:    58 years         BP:           99/69 mmHg Patient Gender: M                HR:           95 bpm. Exam Location:  Inpatient Procedure: 2D Echo, Cardiac Doppler and Color Doppler (Both Spectral and Color            Flow Doppler were utilized during procedure). Indications:    Shock R57.9  History:        Patient has prior history of Echocardiogram examinations, most                 recent 05/10/2022. CAD and Angina, Prior CABG,                  Signs/Symptoms:Chest Pain; Risk Factors:Hypertension and                 Dyslipidemia.  Sonographer:    Thea Norlander RCS Referring Phys: VINA NOVAK SIMPSON IMPRESSIONS  1. Left ventricular ejection fraction, by estimation, is 50 to 55%. The left ventricle has low normal function. The left ventricle has no regional wall motion abnormalities. Left ventricular diastolic parameters were normal.  2. Right ventricular systolic function is normal. The right ventricular size is mildly enlarged. Tricuspid regurgitation signal is inadequate for assessing PA pressure.  3. The mitral valve is normal in structure. No evidence of mitral valve regurgitation. No evidence of mitral stenosis.  4. The aortic valve is normal in structure. Aortic valve regurgitation is not visualized. No aortic stenosis is present.  5. The inferior vena cava is normal in size with greater than 50% respiratory variability, suggesting right atrial pressure of 3 mmHg. FINDINGS  Left Ventricle: Left ventricular ejection fraction, by estimation, is 50 to 55%. The left ventricle has low normal function. The left ventricle has no regional wall motion abnormalities. The left ventricular internal cavity size was normal in size. There is no left ventricular hypertrophy. Left ventricular diastolic parameters were normal. Right Ventricle: The right ventricular size is mildly enlarged. No increase in right ventricular wall thickness. Right ventricular systolic function is normal. Tricuspid regurgitation signal is inadequate for assessing PA pressure. Left Atrium: Left atrial size was normal in size. Right Atrium: Right atrial size was normal in size. Pericardium: There is no evidence of pericardial effusion. Mitral Valve: The mitral valve is normal in structure. No evidence of mitral valve regurgitation. No evidence of mitral valve stenosis. Tricuspid Valve: The tricuspid valve is normal in structure. Tricuspid valve regurgitation is trivial. No evidence of  tricuspid stenosis. Aortic Valve: The aortic valve is normal in structure. Aortic valve regurgitation is not visualized. No aortic stenosis is present. Aortic valve peak gradient measures 7.8 mmHg. Pulmonic Valve: The pulmonic valve was normal in structure. Pulmonic valve regurgitation is not visualized. No evidence of pulmonic stenosis. Aorta: The aortic root is normal in size and structure. Venous: The inferior vena cava is normal in size with greater than 50% respiratory variability, suggesting right atrial pressure of 3 mmHg. IAS/Shunts: No atrial level shunt detected by color flow Doppler.  LEFT VENTRICLE PLAX 2D LVIDd:  4.40 cm   Diastology LVIDs:         3.20 cm   LV e' medial:    8.92 cm/s LV PW:         1.10 cm   LV E/e' medial:  10.5 LV IVS:        1.10 cm   LV e' lateral:   20.40 cm/s LVOT diam:     2.40 cm   LV E/e' lateral: 4.6 LV SV:         73 LV SV Index:   35 LVOT Area:     4.52 cm  RIGHT VENTRICLE             IVC RV S prime:     10.70 cm/s  IVC diam: 2.00 cm TAPSE (M-mode): 1.7 cm LEFT ATRIUM             Index        RIGHT ATRIUM           Index LA diam:        4.00 cm 1.91 cm/m   RA Area:     14.80 cm LA Vol (A2C):   33.1 ml 15.80 ml/m  RA Volume:   29.80 ml  14.23 ml/m LA Vol (A4C):   28.4 ml 13.56 ml/m LA Biplane Vol: 31.2 ml 14.89 ml/m  AORTIC VALVE AV Area (Vmax): 3.36 cm AV Vmax:        140.00 cm/s AV Peak Grad:   7.8 mmHg LVOT Vmax:      104.00 cm/s LVOT Vmean:     70.200 cm/s LVOT VTI:       0.161 m  AORTA Ao Root diam: 3.50 cm Ao Asc diam:  3.40 cm MITRAL VALVE MV Area (PHT): 4.21 cm    SHUNTS MV Decel Time: 180 msec    Systemic VTI:  0.16 m MV E velocity: 93.30 cm/s  Systemic Diam: 2.40 cm MV A velocity: 72.80 cm/s MV E/A ratio:  1.28 Oneil Parchment MD Electronically signed by Oneil Parchment MD Signature Date/Time: 12/18/2023/7:04:53 AM    Final    DG CHEST PORT 1 VIEW Result Date: 12/17/2023 CLINICAL DATA:  Central line placement EXAM: PORTABLE CHEST 1 VIEW COMPARISON:   12/17/2023 FINDINGS: Introduction of a LEFT central venous line with tip in the brachycephalic vein. No pneumothorax Endotracheal tube, NG tube, and RIGHT central venous line are unchanged. Midline sternotomy.  Low lung volumes.  LEFT effusion. IMPRESSION: 1. LEFT central venous line with tip in the brachiocephalic vein. No pneumothorax. 2. Additional support apparatus unchanged. 3. Low lung volumes and LEFT effusion. Electronically Signed   By: Jackquline Boxer M.D.   On: 12/17/2023 16:55   CT GUIDED PERITONEAL/RETROPERITONEAL FLUID DRAIN BY PERC CATH Result Date: 12/17/2023 INDICATION: [id number] Abdominal fluid collection [phone]. Concern for bladder perforation EXAM: CT-GUIDED RIGHT LOWER QUADRANT ABSCESS DRAINAGE CATHETER PLACEMENT COMPARISON:  CT cystogram and CT AP, 12/16/2023. MEDICATIONS: The patient is currently admitted to the hospital and receiving intravenous antibiotics. The antibiotics were administered within an appropriate time frame prior to the initiation of the procedure. ANESTHESIA/SEDATION: Sedation by the ICU RN team was performed. Please see RN log for details. CONTRAST:  None FLUOROSCOPY TIME:  CT dose; 1011 mGycm COMPLICATIONS: None immediate. PROCEDURE: RADIATION DOSE REDUCTION: This exam was performed according to the departmental dose-optimization program which includes automated exposure control, adjustment of the mA and/or kV according to patient size and/or use of iterative reconstruction technique. Informed written consent was obtained from the patient  and/or patient's representative after a discussion of the risks, benefits and alternatives to treatment. The patient was placed supine on the CT gantry and a pre procedural CT was performed re-demonstrating the known abscess/fluid collection within the RIGHT lower quad. The procedure was planned. A timeout was performed prior to the initiation of the procedure. The RIGHT lower quad was prepped and draped in the usual sterile fashion. The  overlying soft tissues were anesthetized with 1% lidocaine  with epinephrine . Appropriate trajectory was planned with the use of a 22 gauge spinal needle. An 18 gauge trocar needle was advanced into the abscess/fluid collection and a short Amplatz super stiff wire was coiled within the collection. Appropriate positioning was confirmed with a limited CT scan. The tract was serially dilated allowing placement of a 14 Fr Fr drainage catheter. Appropriate positioning was confirmed with a limited postprocedural CT scan. 30 mL of turbid fluid was aspirated. The tube was connected to a drainage bag and sutured in place. A dressing was placed. The patient tolerated the procedure well without immediate post procedural complication. IMPRESSION: Successful CT-guided placement of a 14 Fr drainage catheter into the RIGHT lower quadrant with aspiration of 30 mL mL of turbid fluid. Samples were sent to the laboratory as requested by the ordering clinical team. RECOMMENDATIONS: The patient will return to Vascular Interventional Radiology (VIR) for routine drainage catheter evaluation and exchange in 7-10 days. Thom Hall, MD [facility] Radiology Electronically Signed   By: Thom Hall M.D.   On: 12/17/2023 14:04   DG Chest Port 1 View Result Date: 12/17/2023 EXAM: 1 VIEW(S) XRAY OF THE CHEST 12/17/2023 05:48:00 AM COMPARISON: 12/16/2023 CLINICAL HISTORY: Respiratory failure; Hx of: chest pain, coronary artery disease; Chief complaint: Loss of consciousness FINDINGS: LINES, TUBES AND DEVICES: Right IJ central venous catheter in place with tip over the SVC. ETT in place with tip 3.7 cm above the carina. Enteric tube in place, courses below the diaphragm with tip obscured. Overlying defibrillator pads. LUNGS AND PLEURA: Low lung volumes. Mild right basilar atelectasis. Similar left retrocardiac airspace opacity, likely atelectasis. No pulmonary edema. No pleural effusion. No  pneumothorax. HEART AND MEDIASTINUM: No acute abnormality of the cardiac and mediastinal silhouettes. BONES AND SOFT TISSUES: Median sternotomy and CABG changes noted. IMPRESSION: 1. Low lung volumes with mild basilar atelectasis/retrocardiac atelectasis. 2. Support devices appropriately positioned, including right IJ central venous catheter with tip in the SVC, endotracheal tube 3.7 cm above the carina, and enteric tube coursing below the diaphragm. Electronically signed by: Waddell Calk MD 12/17/2023 06:09 AM EDT RP Workstation: [id number]   CT CYSTOGRAM ABD/PELVIS Result Date: 12/16/2023 CLINICAL DATA:  Sepsis with concern for bladder perforation. EXAM: CT CYSTOGRAM (CT ABDOMEN AND PELVIS WITH CONTRAST) TECHNIQUE: Multi-detector CT imaging through the abdomen and pelvis was performed after dilute contrast had been introduced into the bladder for the purposes of performing CT cystography. RADIATION DOSE REDUCTION: This exam was performed according to the departmental dose-optimization program which includes automated exposure control, adjustment of the mA and/or kV according to patient size and/or use of iterative reconstruction technique. CONTRAST:  50mL OMNIPAQUE  IOHEXOL  300 MG/ML  SOLN COMPARISON:  12/16/2023 FINDINGS: Lower chest: Atelectasis or consolidation in both lung bases. Hepatobiliary: Cholelithiasis.  No bile duct dilatation. Pancreas: Unremarkable. No pancreatic ductal dilatation or surrounding inflammatory changes. Spleen: Normal in size without focal abnormality. Adrenals/Urinary Tract: Left adrenal gland nodules. See previous description from prior CT earlier today. Kidneys are symmetrical. No hydronephrosis or hydroureter. Contrast material  fills the bladder. Foley catheter in the bladder. No bladder wall thickening. Contrast material is demonstrated leaking along the catheter through the urethra to the distal penile region. There is focal irregularity at the junction of the urethra with the  bladder base with small anterior contrast pooling suggesting contained leak. No contrast extravasation to suggest intraperitoneal leakage. Remainder the urethra appears otherwise intact. Stomach/Bowel: Enteric tube tip in the stomach. Dilated fluid-filled small bowel demonstrates some decompression since previous study. Colon is decompressed. Persistent wall thickening in the terminal ileum and colon. Vascular/Lymphatic: Aortic atherosclerosis. No enlarged abdominal or pelvic lymph nodes. Reproductive: Prostate gland is surgically absent. No significant pelvic lymphadenopathy. Other: Multiple loculated fluid collections demonstrated in the right abdomen and pelvis as previously discussed. Small amount of free fluid. Diffuse stranding throughout the intra-abdominal fat. Changes are consistent with multiple abscesses. Musculoskeletal: No acute bony abnormalities. No focal bone lesions. IMPRESSION: 1. CT cystogram with Foley catheter in place. Contrast material leaks along the catheter through the urethra. There is focal irregularity at the junction of the bladder base and urethra with small anterior contrast pooling suggesting contained perforation. No intraperitoneal contrast extravasation. 2. Appearance examination is otherwise unchanged. Again demonstrated are multiple loculated fluid collections in the abdomen and pelvis consistent with abscesses. Wall thickening of the cecum and terminal ileum. Fluid-filled dilated small bowel appear mildly improved. Electronically Signed   By: Elsie Gravely M.D.   On: 12/16/2023 22:41   CT Angio Chest Pulmonary Embolism (PE) W or WO Contrast Result Date: 12/16/2023 CLINICAL DATA:  Aortic aneurysm suspected. Severe right-sided abdominal pain and abdominal distention. Decreased oxygen saturation. EXAM: CT ANGIOGRAPHY CHEST CT ABDOMEN AND PELVIS WITH CONTRAST TECHNIQUE: Multidetector CT imaging of the chest was performed using the standard protocol during bolus administration  of intravenous contrast. Multiplanar CT image reconstructions and MIPs were obtained to evaluate the vascular anatomy. Multidetector CT imaging of the abdomen and pelvis was performed using the standard protocol during bolus administration of intravenous contrast. RADIATION DOSE REDUCTION: This exam was performed according to the departmental dose-optimization program which includes automated exposure control, adjustment of the mA and/or kV according to patient size and/or use of iterative reconstruction technique. CONTRAST:  75mL OMNIPAQUE  IOHEXOL  350 MG/ML SOLN COMPARISON:  Chest radiograph 12/16/2023.  PET-CT 06/23/2023 FINDINGS: CTA CHEST FINDINGS Cardiovascular: Cardiac enlargement. No pericardial effusions. Normal caliber thoracic aorta. No aortic dissection. Calcification of the aorta and coronary arteries. Great vessel origins are patent. Postoperative changes consistent with coronary bypass. No evidence of significant pulmonary embolus. Mediastinum/Nodes: Enteric tube with tip in the stomach. Esophagus is decompressed. Endotracheal tube with tip above the carina. Thyroid gland is unremarkable. Scattered mediastinal lymph nodes are not pathologically enlarged, likely reactive. Lungs/Pleura: Consolidation or atelectasis in both lung bases, possibly pneumonia, compressive atelectasis, or aspiration. Small bilateral pleural effusions. No pneumothorax. Musculoskeletal: Degenerative changes in the spine. Sternotomy wires. No acute bony abnormalities. Review of the MIP images confirms the above findings. CT ABDOMEN and PELVIS FINDINGS Hepatobiliary: No focal liver lesions. Cholelithiasis with several stones in the gallbladder. No wall thickening or inflammatory stranding. No bile duct dilatation. Pancreas: Unremarkable. No pancreatic ductal dilatation or surrounding inflammatory changes. Spleen: Normal in size without focal abnormality. Adrenals/Urinary Tract: Left adrenal gland nodule measuring 2.1 cm diameter.  Sixty-five Hounsfield unit density measurement. The lesions are unchanged since prior unenhanced study from 12/18/2020 at which time the density measurement was -3 Hounsfield units. This is consistent with a benign fat containing adenoma and no imaging follow-up is indicated.  Kidneys are symmetrical and homogeneous. No hydronephrosis or hydroureter. Bladder is decompressed. Stomach/Bowel: Stomach is unremarkable. Colon is decompressed. Wall thickening of the ascending colon and hepatic flexure, new since prior study. This may indicate colitis or inflammatory bowel disease versus reactive change. Dilated fluid-filled small bowel with air-fluid levels and mild diffuse wall thickening. Terminal ileum is decompressed. This is consistent with small-bowel obstruction, possibly due to inflammatory strictures. Vascular/Lymphatic: Aortic atherosclerosis. No enlarged abdominal or pelvic lymph nodes. Reproductive: Prostate is unremarkable. Other: Diffuse infiltration throughout the right-sided abdominal and pelvic mesenteric fat. Multiple loculated fluid collections are demonstrated throughout the right abdomen and pelvis. A right pelvic collection measures 7.4 cm diameter. A right anterior lower quadrant collection measures 8.8 cm diameter. A right anterior para psoas collection measures 9.4 cm diameter. Multiple smaller right upper quadrant collections are demonstrated. Some collections contains small gas bubbles. This appearance is likely indicate multiple abscesses, possibly due to perforation. Could also consider a perforated appendicitis. Small amount of free fluid in the abdomen, likely ascites or reactive fluid. No free intra-abdominal gas. Musculoskeletal: Degenerative changes in the spine. No acute bony abnormalities. Review of the MIP images confirms the above findings. IMPRESSION: 1. Fluid-filled distended small bowel with decompressed terminal ileum consistent with small-bowel obstruction. 2. Irregular wall  thickening diffusely throughout the distal small bowel and right colon suggesting enterocolitis or inflammatory bowel disease. Inflammatory strictures may be present. 3. Multiple loculated fluid collections, some with gas, demonstrated in the right abdomen and pelvis indicating abscesses. This is likely due to perforation, possibly related to inflammatory bowel disease or to an occult perforated appendicitis. The appendix is self is not demonstrated. No free intra-abdominal air. 4. Small amount of free fluid in the abdomen is likely reactive. 5. Cholelithiasis without evidence of acute cholecystitis. 6. Aortic atherosclerosis. 7. Consolidation versus compressive atelectasis in both lung bases. Small bilateral pleural effusions. Critical Value/emergent results were called by telephone at the time of interpretation on 12/16/2023 at 5:28 pm to provider MATTHEW TRIFAN , who verbally acknowledged these results. Electronically Signed   By: Elsie Gravely M.D.   On: 12/16/2023 17:33   CT ABDOMEN PELVIS W CONTRAST Result Date: 12/16/2023 CLINICAL DATA:  Aortic aneurysm suspected. Severe right-sided abdominal pain and abdominal distention. Decreased oxygen saturation. EXAM: CT ANGIOGRAPHY CHEST CT ABDOMEN AND PELVIS WITH CONTRAST TECHNIQUE: Multidetector CT imaging of the chest was performed using the standard protocol during bolus administration of intravenous contrast. Multiplanar CT image reconstructions and MIPs were obtained to evaluate the vascular anatomy. Multidetector CT imaging of the abdomen and pelvis was performed using the standard protocol during bolus administration of intravenous contrast. RADIATION DOSE REDUCTION: This exam was performed according to the departmental dose-optimization program which includes automated exposure control, adjustment of the mA and/or kV according to patient size and/or use of iterative reconstruction technique. CONTRAST:  75mL OMNIPAQUE  IOHEXOL  350 MG/ML SOLN COMPARISON:   Chest radiograph 12/16/2023.  PET-CT 06/23/2023 FINDINGS: CTA CHEST FINDINGS Cardiovascular: Cardiac enlargement. No pericardial effusions. Normal caliber thoracic aorta. No aortic dissection. Calcification of the aorta and coronary arteries. Great vessel origins are patent. Postoperative changes consistent with coronary bypass. No evidence of significant pulmonary embolus. Mediastinum/Nodes: Enteric tube with tip in the stomach. Esophagus is decompressed. Endotracheal tube with tip above the carina. Thyroid gland is unremarkable. Scattered mediastinal lymph nodes are not pathologically enlarged, likely reactive. Lungs/Pleura: Consolidation or atelectasis in both lung bases, possibly pneumonia, compressive atelectasis, or aspiration. Small bilateral pleural effusions. No pneumothorax. Musculoskeletal: Degenerative changes in the  spine. Sternotomy wires. No acute bony abnormalities. Review of the MIP images confirms the above findings. CT ABDOMEN and PELVIS FINDINGS Hepatobiliary: No focal liver lesions. Cholelithiasis with several stones in the gallbladder. No wall thickening or inflammatory stranding. No bile duct dilatation. Pancreas: Unremarkable. No pancreatic ductal dilatation or surrounding inflammatory changes. Spleen: Normal in size without focal abnormality. Adrenals/Urinary Tract: Left adrenal gland nodule measuring 2.1 cm diameter. Sixty-five Hounsfield unit density measurement. The lesions are unchanged since prior unenhanced study from 12/18/2020 at which time the density measurement was -3 Hounsfield units. This is consistent with a benign fat containing adenoma and no imaging follow-up is indicated. Kidneys are symmetrical and homogeneous. No hydronephrosis or hydroureter. Bladder is decompressed. Stomach/Bowel: Stomach is unremarkable. Colon is decompressed. Wall thickening of the ascending colon and hepatic flexure, new since prior study. This may indicate colitis or inflammatory bowel disease  versus reactive change. Dilated fluid-filled small bowel with air-fluid levels and mild diffuse wall thickening. Terminal ileum is decompressed. This is consistent with small-bowel obstruction, possibly due to inflammatory strictures. Vascular/Lymphatic: Aortic atherosclerosis. No enlarged abdominal or pelvic lymph nodes. Reproductive: Prostate is unremarkable. Other: Diffuse infiltration throughout the right-sided abdominal and pelvic mesenteric fat. Multiple loculated fluid collections are demonstrated throughout the right abdomen and pelvis. A right pelvic collection measures 7.4 cm diameter. A right anterior lower quadrant collection measures 8.8 cm diameter. A right anterior para psoas collection measures 9.4 cm diameter. Multiple smaller right upper quadrant collections are demonstrated. Some collections contains small gas bubbles. This appearance is likely indicate multiple abscesses, possibly due to perforation. Could also consider a perforated appendicitis. Small amount of free fluid in the abdomen, likely ascites or reactive fluid. No free intra-abdominal gas. Musculoskeletal: Degenerative changes in the spine. No acute bony abnormalities. Review of the MIP images confirms the above findings. IMPRESSION: 1. Fluid-filled distended small bowel with decompressed terminal ileum consistent with small-bowel obstruction. 2. Irregular wall thickening diffusely throughout the distal small bowel and right colon suggesting enterocolitis or inflammatory bowel disease. Inflammatory strictures may be present. 3. Multiple loculated fluid collections, some with gas, demonstrated in the right abdomen and pelvis indicating abscesses. This is likely due to perforation, possibly related to inflammatory bowel disease or to an occult perforated appendicitis. The appendix is self is not demonstrated. No free intra-abdominal air. 4. Small amount of free fluid in the abdomen is likely reactive. 5. Cholelithiasis without evidence  of acute cholecystitis. 6. Aortic atherosclerosis. 7. Consolidation versus compressive atelectasis in both lung bases. Small bilateral pleural effusions. Critical Value/emergent results were called by telephone at the time of interpretation on 12/16/2023 at 5:28 pm to provider MATTHEW TRIFAN , who verbally acknowledged these results. Electronically Signed   By: Elsie Gravely M.D.   On: 12/16/2023 17:33   CT HEAD WO CONTRAST Result Date: 12/16/2023 CLINICAL DATA:  Altered mental status. EXAM: CT HEAD WITHOUT CONTRAST TECHNIQUE: Contiguous axial images were obtained from the base of the skull through the vertex without intravenous contrast. RADIATION DOSE REDUCTION: This exam was performed according to the departmental dose-optimization program which includes automated exposure control, adjustment of the mA and/or kV according to patient size and/or use of iterative reconstruction technique. COMPARISON:  May 10, 2022 FINDINGS: Brain: No evidence of acute infarction, hemorrhage, hydrocephalus, extra-axial collection or mass lesion/mass effect. There are areas of mildly decreased attenuation within the white matter tracts of the supratentorial brain, consistent with microvascular disease changes. Vascular: No hyperdense vessel or unexpected calcification. Skull: Normal. Negative for fracture or  focal lesion. Sinuses/Orbits: No acute finding. Other: None. IMPRESSION: No acute intracranial pathology. Electronically Signed   By: Suzen Dials M.D.   On: 12/16/2023 17:17   DG Chest Port 1 View Result Date: 12/16/2023 CLINICAL DATA:  [id number] Encounter for central line placement [id number] EXAM: PORTABLE CHEST - 1 VIEW COMPARISON:  12/16/2023, 07/16/2023 FINDINGS: Similarly positioned endotracheal and esophagogastric tubes. Interval placement of a right IJ approach central venous catheter, which terminates at the cavoatrial junction. Improved aeration of the lungs. Retrocardiac airspace opacities. No pleural  effusion or pneumothorax. No cardiomegaly. Sternotomy wires and CABG markers. Defibrillator pads. IMPRESSION: 1. Interval placement of a right IJ approach central venous catheter, which terminates at the cavoatrial junction. No pneumothorax. 2. Improved aeration of the lungs with retrocardiac airspace opacities, possibly atelectasis or aspiration. Electronically Signed   By: Rogelia Myers M.D.   On: 12/16/2023 16:28   DG Chest Port 1 View Result Date: 12/16/2023 CLINICAL DATA:  Central line and ET tube placement EXAM: PORTABLE CHEST - 1 VIEW COMPARISON:  July 16, 2023 FINDINGS: Endotracheal tube terminates in the mid trachea. Esophagogastric tube terminates in the region of the stomach. Overlying defibrillator pads. Markedly low lung volumes. No focal airspace consolidation, pleural effusion, or pneumothorax. No cardiomegaly. IMPRESSION: 1. Well-positioned endotracheal and esophagogastric tubes. No pneumothorax. 2. Markedly low lung volumes.  No pneumonia or pleural effusion. Electronically Signed   By: Rogelia Myers M.D.   On: 12/16/2023 16:26    Labs:  CBC: Recent Labs    12/16/23 1524 12/16/23 1535 12/17/23 0333 12/17/23 0422 12/17/23 1037 12/18/23 0336 12/19/23 0417 12/19/23 1035  WBC 6.0  --  4.8  --   --  9.8 12.9*  --   HGB 12.6*   < > 12.1*   < > 10.9* 10.6* 10.5* 10.5*  HCT 37.1*   < > 35.4*   < > 32.0* 29.6* 29.6* 31.0*  PLT 324  --  303  --   --  260 258  --    < > = values in this interval not displayed.    COAGS: Recent Labs    12/16/23 1524  INR 1.2  APTT 29    BMP: Recent Labs    12/17/23 1804 12/18/23 0336 12/18/23 1615 12/19/23 0417 12/19/23 1035  NA 126* 127*  126* 131* 131* 132*  K 4.5 4.1  4.1 4.1 4.2 4.2  CL 88* 93*  91* 97* 99  --   CO2 18* 20*  20* 22 21*  --   GLUCOSE 117* 130*  129* 133* 141*  --   BUN 86* 68*  68* 54* 47*  --   CALCIUM  7.2* 7.0*  7.0* 7.3* 7.7*  --   CREATININE 5.83* 4.55*  4.39* 3.54* 2.99*  --   GFRNONAA 10*  14*  15* 19* 23*  --     LIVER FUNCTION TESTS: Recent Labs    12/16/23 1524 12/17/23 0333 12/17/23 1804 12/18/23 0336 12/18/23 1615 12/19/23 0416 12/19/23 0417  BILITOT 3.4* 3.9*  --  3.1*  --  2.3*  --   AST 68* 83*  --  85*  --  62*  --   ALT 37 43  --  41  --  34  --   ALKPHOS 69 85  --  105  --  116  --   PROT 5.2* 5.6*  --  5.4*  --  5.6*  --   ALBUMIN  1.6* 1.5*   < > <1.5*  <1.5* <1.5* <1.5* <  1.5*   < > = values in this interval not displayed.    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 Hughes.  Drain assessed in ICU today.  Patient remains intubated, sedated. RLQ drain intact.  After 2 days of significant output, volume out today has been decreased.  Remains clear/yellow/serous in nature.   IR following.  Per Dr. Hughes note 9/27, consider drain injection 1 week after placement.  Will trend through end of week and determine best course.   Electronically Signed: Addelynn Batte Sue-Ellen Savahna Casados, PA 12/19/2023, 3:15 PM   I spent a total of 15 Minutes at the the patient's bedside AND on the patient's hospital floor or unit, greater than 50% of which was counseling/coordinating care for RLQ fluid collection.

## 2023-12-19 NOTE — Progress Notes (Signed)
 NAME:  Glenn Stewart, MRN:  [id number], DOB:  [birth year], LOS: 3 ADMISSION DATE:  12/16/2023, CONSULTATION DATE:  12/16/23 REFERRING MD:  Dr. Cottie, CHIEF COMPLAINT:  weakness   bRIEF  HPI obtained from EMR as pt is intubated and sedated.   58 yoM with PMH of HTN, CAD, CABG x 5 04/2022 with post op Afib since stopped on Eliquis  and amiodarone  09/2022, HTN, HLD, GERD, NASH, bipolar/ ADHD and s/p recent robotic assisted laparoscopic radial prostatectomy on 11/17/23 who presented for weakness and waxing mental status with distended and tender abdominal by EMS found with episodes of Afib with RVR and VT.  Was hemodynamically unstable on arrival, underwent emergent cardioversion and given amiodarone  x2 and magnesium , with emerging rhythm of afib with RVR possible flutter.  Cardiology consulted. Post cardioversion became hypotensive with respiratory distress therefore intubated and placed on norepinephrine .  Difficulty oxygenating post intubation with normal PIP requiring 1.0 and 12 PEEP.  Labs noted for K2.8, Na 129, bicarb 16, BUN/ sCr 55/ 3.92 (recently  ), t. Bili 3.4, AST 68, albumin  1.6, protein 3.4, INR 1.2.  CTH neg, CTA PE and CT a/p w/contrast pending official read.  2.1L of brownish OGT contents s/p OGT placement. Cultures sent, empiric zosyn / vanc started.  Afebrile, anuric and since weaned off pressors.  R internal jugular CVL placed by EDP.  PCCM consulted for admission.     NOTE:LD him as outpatient, patient self-irrigated catheter a couple days after initial surgery and then removed catheter himself prior to follow-up at 1 week. Patient reported that he cut tubing prior to removal but unable to confirm if removal was truly atraumat   Pertinent  Medical History    has a past medical history of ADHD (attention deficit hyperactivity disorder), Anxiety state, unspecified, Bipolar 1 disorder (HCC), Cancer (HCC), CHEST PAIN (12/04/2008), Coronary artery disease, Depressive disorder, not elsewhere  classified, Dermatophytosis of nail, Esophageal reflux, History of kidney stones, HYPERGLYCEMIA (07/10/2007), Low back pain (09/18/2013), Other chronic nonalcoholic liver disease, Pre-diabetes, Problems with hearing, Prostatitis, unspecified, Pure hypercholesterolemia, Screen for STD (sexually transmitted disease) (04/20/2011), Sprain of cruciate ligament of knee, Tear of medial cartilage or meniscus of knee, current, TMJ (temporomandibular joint disorder) (12/25/2010), Unspecified essential hypertension, Unspecified gastritis and gastroduodenitis without mention of hemorrhage, and Urethritis, unspecified.   has a past surgical history that includes No prior surgery; LEFT HEART CATH AND CORONARY ANGIOGRAPHY (N/A, 05/10/2022); Coronary artery bypass graft (N/A, 05/12/2022); Radial artery harvest (Left, 05/12/2022); TEE without cardioversion (N/A, 05/12/2022); Cystoscopy w/ litholapaxy / EHL; and Robot assisted laparoscopic radical prostatectomy (N/A, 11/17/2023).   Significant Hospital Events: Including procedures, antibiotic start and stop dates in addition to other pertinent events   9/26> admit 9/27: 90% fio2 on ventilator. On diprivan  gtt, fent gtt, amio gtt. CVP 9.  AFebrile. Cutlure negative so far. cReat 5.13 and climbing (Baseline 0.91 on 8/292/25). AKI started 12/16/23. Normal lactate byu Ionized calcim is low. Seen by urology  & CCS- recommending  IR attemt of intrabdominal abscess that is in RLQ and tracks down bladder..  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 CCS does not think there is appendicit or diveritculits. And that SBO/Colitis is secondary to urinoma. Off levop and followed commands. aNURIC - 100cc only since admit 9/27 CT IMPRESSION: . There is focal irregularity at the junction of the bladder base and urethra with small anterior contrast pooling suggesting contained perforation. No intraperitoneal contrast extravasation. Appearance examination is otherwise unchanged. Again  demonstrated re multiple loculated fluid collections  in the abdomen and pelvis onsistent with abscesses. Wall thickening of the cecum and terminal ileum. Fluid-filled dilated small bowel appear mildly improved.  Consolidation or atelectasis in both lung bases, possibly pneumonia, compressive atelectasis, or aspiration. Small bilateral pleural effusions  . Wall thickening of the ascending colon and hepatic flexure, new since prior study. This may indicate colitis or inflammatory bowel disease versus reactive change. Dilated fluid-filled small bowel with air-fluid levels and mild diffuse wall thickening. Terminal ileum is decompressed. This is consistent with small-bowel obstruction, possibly due to inflammatory strictures. IR Procedure: DRAINAGE CATHETER PLACEMENT into a RIGHT LOWER QUADRANT FLUID COLLECTION ->> 1L drained HD CATH Placed CRRT started 9/28 - On vent. Ongoing CRRT. Seen by cCS  - still concernd about Urinoma and secondary blowe irritation and not a true obstruction NGT output is low c/w ileus. CCS does  NOT want enteral medication. Recommnding max only trophic feds. CUlture negative. AFfebile.       Interim History / Subjective:   Able to pull neg 100 mL on CRRT. On continued abx, cultures from intrabd abscess with MSSA and Urine Cx with Enterobacterales. More awake, following directions. Minimal urine output.  Objective    Blood pressure 116/76, pulse 74, temperature 98.6 F (37 C), resp. rate (!) 28, height 5' 5 (1.651 m), weight 102.2 kg, SpO2 97%. CVP:  [8 mmHg-15 mmHg] 13 mmHg  Vent Mode: PRVC FiO2 (%):  [40 %] 40 % Set Rate:  [28 bmp] 28 bmp Vt Set:  [490 mL] 490 mL PEEP:  [5 cmH20] 5 cmH20 Plateau Pressure:  [20 cmH20-25 cmH20] 21 cmH20   Intake/Output Summary (Last 24 hours) at 12/19/2023 0854 Last data filed at 12/19/2023 0800 Gross per 24 hour  Intake 5132.45 ml  Output 7045 ml  Net -1912.55 ml   Filed Weights   12/17/23 0414 12/18/23 0124 12/19/23 0247   Weight: 103.8 kg 103.5 kg 102.2 kg    Examination:  General Appearance: ill appearing male Head:  Normocephalic, without obvious abnormality, atraumatic Eyes:  jaundiced sclera Throat:  ETT TUBE - yes , OG tube - yes Lungs: clear to auscultation on  Ventilator with synchrony Heart:  S1 S2 normal, without murmur Abdomen:  protuberant, tympanic, tender to palpation without rebound tenderness. BS+. Drains in place Extremities: warm, non edematous. Skin:  warm Neurologic: Tracks, sticks tongue out, moves extremities with directions   Assessment and Plan   Septic shock, resolved Prostate cancer s/p robotic assisted laparoscopic radial prostatectomy on 11/17/23 Urinoma S/p IR drainage on 9/27 Ucx with 80K enterobacter colonies with suspected ampc production; awaiting susceptibilites Abdominal abscess with MSSA Drain with clear urine. Now off levophed  and stable MAP - Stop on Zyvox  and Zosyn  - Switch to cefepime and flagyl  today given microbiology results - Off fluids at of this morning - Appreciate Urology recommendations and guidance  Reactive ileus vs partial SBO with intraabdominal fluid collections and abscess at anterior anastemosis Quite distended on exam with +BS. Stopped tube feeds. Patient awaiting SBO XR series after oral contrast per tube - Appreciate General surgery recommendations - Continue on GNR and anaerobic coverage  Acute hypoxic and hypercarbic respiratory failure suspected early aspiration of BLL PNA/ pneumonitis concerning for developing early ARDS/ALI - - CTA PE neg for acute PE. Blood gas reviewed this AM - PRVC with VAP bundle - Reassess daily - ABG as needed with vent changes  Afib VT, monomorphic Hx CAD, prior CABG x 5 04/2022, and post op Afib (off amio/ eliquis  09/2022), HTN, HLD -  s/p emergent cardioversion during this admission, continued on amiodarone  drip - Admission echo ef 55% without wall motion abnormalities - appreciate cardiology  recommendations  CRRT dependent AKI since 9/27 No hydronephrosis on CT, bladder decompressed. Now with fluid removal. Cr improving.  - Appreciate nephrology  Acute on chronic anemia  Critical illness 9/28: Mild and stable - Continue to monitor - Transfuse for Hgb <8  Elevated AST Elevated T bili Likely in setting of urosepsis. Improving - Awaiting labs  Neuro/Encephalopathy  - On sedation  Best practice:  Diet: NPO - TRophic efeeds since 12/18/23 now stopped 2/2 suspected pSBO vs ileus Pain/Anxiety/Delirium protocol (if indicated): Diprivan  gtt and Fent gtt VAP protocol (if indicated): YES DVT prophylaxis: heparin  5000 tid 12/17/23 GI prophylaxis: PPI Glucose control: ssi Mobility: bed rest Code Status: full code DISPOSITION:L Keep in ICU 12/18/23  Family Communication:   - Called Mom Rock Spanner [phone] - she said she is 60 and deferred that I called daughter but did inform her that he is on life support  - Called Sister Lucian Pond [phone]- called and  updated 9:22 AM 12/17/2023 (she is in [place] and planning to come). She 0  - 12/18/23:   Called  sister Lucian Pond - and updated   SIGNATURE   Hadassah Kristy Ahr, MD [facility] - PGY-3 12/19/2023, 11:43 AM  LABS    PULMONARY Recent Labs  Lab 12/16/23 1710 12/16/23 2124 12/17/23 0422 12/17/23 0908 12/17/23 1002 12/17/23 1037  PHART 7.219* 7.301* 7.292* 7.346*  --  7.349*  PCO2ART 54.3* 41.3 42.5 36.8  --  34.2  PO2ART 74* 102 95 116*  --  91  HCO3 22.3 20.4 20.6 20.1  --  18.8*  TCO2 24 22 22  21*  --  20*  O2SAT 92 97 97 98 77.7 97    CBC Recent Labs  Lab 12/17/23 0333 12/17/23 0422 12/17/23 1037 12/18/23 0336 12/19/23 0417  HGB 12.1*   < > 10.9* 10.6* 10.5*  HCT 35.4*   < > 32.0* 29.6* 29.6*  WBC 4.8  --   --  9.8 12.9*  PLT 303  --   --  260 258   < > = values in this interval not displayed.    COAGULATION Recent Labs  Lab 12/16/23 1524  INR 1.2     CARDIAC  No results for input(s): TROPONINI in the last 168 hours. No results for input(s): PROBNP in the last 168 hours.   CHEMISTRY Recent Labs  Lab 12/16/23 1524 12/16/23 1535 12/17/23 0333 12/17/23 0422 12/17/23 1037 12/17/23 1804 12/18/23 0336 12/18/23 1615 12/19/23 0417  NA 129*   < > 125*   < > 123* 126* 127*  126* 131* 131*  K 2.8*   < > 4.3   < > 4.4 4.5 4.1  4.1 4.1 4.2  CL 96*   < > 87*  --   --  88* 93*  91* 97* 99  CO2 16*   < > 17*  --   --  18* 20*  20* 22 21*  GLUCOSE 118*   < > 121*  --   --  117* 130*  129* 133* 141*  BUN 55*   < > 74*  --   --  86* 68*  68* 54* 47*  CREATININE 3.92*   < > 5.13*  --   --  5.83* 4.55*  4.39* 3.54* 2.99*  CALCIUM  6.3*   < > 7.0*  --   --  7.2* 7.0*  7.0* 7.3* 7.7*  MG 2.3  --  2.9*  --   --   --  2.8*  --  3.0*  PHOS 7.3*  --  11.7*  --   --  10.2* 6.1* 4.0 3.3   < > = values in this interval not displayed.   Estimated Creatinine Clearance: 29.3 mL/min (A) (by C-G formula based on SCr of 2.99 mg/dL (H)).   LIVER Recent Labs  Lab 12/16/23 1524 12/17/23 0333 12/17/23 1804 12/18/23 0336 12/18/23 1615 12/19/23 0417  AST 68* 83*  --  85*  --   --   ALT 37 43  --  41  --   --   ALKPHOS 69 85  --  105  --   --   BILITOT 3.4* 3.9*  --  3.1*  --   --   PROT 5.2* 5.6*  --  5.4*  --   --   ALBUMIN  1.6* 1.5* <1.5* <1.5*  <1.5* <1.5* <1.5*  INR 1.2  --   --   --   --   --      INFECTIOUS Recent Labs  Lab 12/16/23 1600 12/16/23 1839 12/16/23 2023  LATICACIDVEN 3.5* 1.2 1.3     ENDOCRINE CBG (last 3)  Recent Labs    12/18/23 2315 12/19/23 0339 12/19/23 0709  GLUCAP 151* 141* 129*     IMAGING x48h  - image(s) personally visualized  -   highlighted in bold DG CHEST PORT 1 VIEW Result Date: 12/19/2023 CLINICAL DATA:  Respiratory failure. EXAM: PORTABLE CHEST 1 VIEW COMPARISON:  12/17/2023. FINDINGS: Endotracheal tube tip terminates approximately 4 cm above the carina. Similarly position right  IJ CVC catheter tip in the right atrium and the left CVC catheter tip overlying the brachiocephalic vein. No pneumothorax. Stable cardiomediastinal contours status post median sternotomy and CABG. Similar small left pleural effusion. IMPRESSION: 1. Similarly position life-support apparatus, as above. 2. Small left pleural effusion, not significantly changed. Electronically Signed   By: Harrietta Sherry M.D.   On: 12/19/2023 08:21   ECHOCARDIOGRAM COMPLETE Result Date: 12/18/2023    ECHOCARDIOGRAM REPORT   Patient Name:   Glenn Stewart Date of Exam: 12/17/2023 Medical Rec #:  [id number]        Height:       65.0 in Accession #:    [id number]       Weight:       228.8 lb Date of Birth:  1965-06-25         BSA:          2.095 m Patient Age:    58 years         BP:           99/69 mmHg Patient Gender: M                HR:           95 bpm. Exam Location:  Inpatient Procedure: 2D Echo, Cardiac Doppler and Color Doppler (Both Spectral and Color            Flow Doppler were utilized during procedure). Indications:    Shock R57.9  History:        Patient has prior history of Echocardiogram examinations, most                 recent 05/10/2022. CAD and Angina, Prior CABG,                 Signs/Symptoms:Chest  Pain; Risk Factors:Hypertension and                 Dyslipidemia.  Sonographer:    Thea Norlander RCS Referring Phys: VINA NOVAK SIMPSON IMPRESSIONS  1. Left ventricular ejection fraction, by estimation, is 50 to 55%. The left ventricle has low normal function. The left ventricle has no regional wall motion abnormalities. Left ventricular diastolic parameters were normal.  2. Right ventricular systolic function is normal. The right ventricular size is mildly enlarged. Tricuspid regurgitation signal is inadequate for assessing PA pressure.  3. The mitral valve is normal in structure. No evidence of mitral valve regurgitation. No evidence of mitral stenosis.  4. The aortic valve is normal in structure. Aortic valve  regurgitation is not visualized. No aortic stenosis is present.  5. The inferior vena cava is normal in size with greater than 50% respiratory variability, suggesting right atrial pressure of 3 mmHg. FINDINGS  Left Ventricle: Left ventricular ejection fraction, by estimation, is 50 to 55%. The left ventricle has low normal function. The left ventricle has no regional wall motion abnormalities. The left ventricular internal cavity size was normal in size. There is no left ventricular hypertrophy. Left ventricular diastolic parameters were normal. Right Ventricle: The right ventricular size is mildly enlarged. No increase in right ventricular wall thickness. Right ventricular systolic function is normal. Tricuspid regurgitation signal is inadequate for assessing PA pressure. Left Atrium: Left atrial size was normal in size. Right Atrium: Right atrial size was normal in size. Pericardium: There is no evidence of pericardial effusion. Mitral Valve: The mitral valve is normal in structure. No evidence of mitral valve regurgitation. No evidence of mitral valve stenosis. Tricuspid Valve: The tricuspid valve is normal in structure. Tricuspid valve regurgitation is trivial. No evidence of tricuspid stenosis. Aortic Valve: The aortic valve is normal in structure. Aortic valve regurgitation is not visualized. No aortic stenosis is present. Aortic valve peak gradient measures 7.8 mmHg. Pulmonic Valve: The pulmonic valve was normal in structure. Pulmonic valve regurgitation is not visualized. No evidence of pulmonic stenosis. Aorta: The aortic root is normal in size and structure. Venous: The inferior vena cava is normal in size with greater than 50% respiratory variability, suggesting right atrial pressure of 3 mmHg. IAS/Shunts: No atrial level shunt detected by color flow Doppler.  LEFT VENTRICLE PLAX 2D LVIDd:         4.40 cm   Diastology LVIDs:         3.20 cm   LV e' medial:    8.92 cm/s LV PW:         1.10 cm   LV E/e'  medial:  10.5 LV IVS:        1.10 cm   LV e' lateral:   20.40 cm/s LVOT diam:     2.40 cm   LV E/e' lateral: 4.6 LV SV:         73 LV SV Index:   35 LVOT Area:     4.52 cm  RIGHT VENTRICLE             IVC RV S prime:     10.70 cm/s  IVC diam: 2.00 cm TAPSE (M-mode): 1.7 cm LEFT ATRIUM             Index        RIGHT ATRIUM           Index LA diam:        4.00 cm 1.91 cm/m   RA Area:  14.80 cm LA Vol (A2C):   33.1 ml 15.80 ml/m  RA Volume:   29.80 ml  14.23 ml/m LA Vol (A4C):   28.4 ml 13.56 ml/m LA Biplane Vol: 31.2 ml 14.89 ml/m  AORTIC VALVE AV Area (Vmax): 3.36 cm AV Vmax:        140.00 cm/s AV Peak Grad:   7.8 mmHg LVOT Vmax:      104.00 cm/s LVOT Vmean:     70.200 cm/s LVOT VTI:       0.161 m  AORTA Ao Root diam: 3.50 cm Ao Asc diam:  3.40 cm MITRAL VALVE MV Area (PHT): 4.21 cm    SHUNTS MV Decel Time: 180 msec    Systemic VTI:  0.16 m MV E velocity: 93.30 cm/s  Systemic Diam: 2.40 cm MV A velocity: 72.80 cm/s MV E/A ratio:  1.28 Oneil Parchment MD Electronically signed by Oneil Parchment MD Signature Date/Time: 12/18/2023/7:04:53 AM    Final    DG CHEST PORT 1 VIEW Result Date: 12/17/2023 CLINICAL DATA:  Central line placement EXAM: PORTABLE CHEST 1 VIEW COMPARISON:  12/17/2023 FINDINGS: Introduction of a LEFT central venous line with tip in the brachycephalic vein. No pneumothorax Endotracheal tube, NG tube, and RIGHT central venous line are unchanged. Midline sternotomy.  Low lung volumes.  LEFT effusion. IMPRESSION: 1. LEFT central venous line with tip in the brachiocephalic vein. No pneumothorax. 2. Additional support apparatus unchanged. 3. Low lung volumes and LEFT effusion. Electronically Signed   By: Jackquline Boxer M.D.   On: 12/17/2023 16:55   CT GUIDED PERITONEAL/RETROPERITONEAL FLUID DRAIN BY PERC CATH Result Date: 12/17/2023 INDICATION: [id number] Abdominal fluid collection [phone]. Concern for bladder perforation EXAM: CT-GUIDED RIGHT LOWER QUADRANT ABSCESS DRAINAGE CATHETER PLACEMENT  COMPARISON:  CT cystogram and CT AP, 12/16/2023. MEDICATIONS: The patient is currently admitted to the hospital and receiving intravenous antibiotics. The antibiotics were administered within an appropriate time frame prior to the initiation of the procedure. ANESTHESIA/SEDATION: Sedation by the ICU RN team was performed. Please see RN log for details. CONTRAST:  None FLUOROSCOPY TIME:  CT dose; 1011 mGycm COMPLICATIONS: None immediate. PROCEDURE: RADIATION DOSE REDUCTION: This exam was performed according to the departmental dose-optimization program which includes automated exposure control, adjustment of the mA and/or kV according to patient size and/or use of iterative reconstruction technique. Informed written consent was obtained from the patient and/or patient's representative after a discussion of the risks, benefits and alternatives to treatment. The patient was placed supine on the CT gantry and a pre procedural CT was performed re-demonstrating the known abscess/fluid collection within the RIGHT lower quad. The procedure was planned. A timeout was performed prior to the initiation of the procedure. The RIGHT lower quad was prepped and draped in the usual sterile fashion. The overlying soft tissues were anesthetized with 1% lidocaine  with epinephrine . Appropriate trajectory was planned with the use of a 22 gauge spinal needle. An 18 gauge trocar needle was advanced into the abscess/fluid collection and a short Amplatz super stiff wire was coiled within the collection. Appropriate positioning was confirmed with a limited CT scan. The tract was serially dilated allowing placement of a 14 Fr Fr drainage catheter. Appropriate positioning was confirmed with a limited postprocedural CT scan. 30 mL of turbid fluid was aspirated. The tube was connected to a drainage bag and sutured in place. A dressing was placed. The patient tolerated the procedure well without immediate post procedural complication. IMPRESSION:  Successful CT-guided placement of a 14 Fr drainage catheter into  the RIGHT lower quadrant with aspiration of 30 mL mL of turbid fluid. Samples were sent to the laboratory as requested by the ordering clinical team. RECOMMENDATIONS: The patient will return to Vascular Inter[facility]iology (VIR) for routine drainage catheter evaluation and exchange in 7-10 days. Thom Hall, MD [facility] Electronically Signed   By: Thom Hall M.D.   On: 12/17/2023 14:04

## 2023-12-19 NOTE — Hospital Course (Signed)
 Per Dr. Hughes note 9/27, consider drain injection 1 week after placement.

## 2023-12-19 NOTE — Progress Notes (Addendum)
 [facility] Progress Note     Subjective: CC:  Intubated, sedated.  Per RN he remains on Trickle TF. Had maybe a smear of stool in last 24h.   Levo 2 mcg/min Zosyn  Objective: Vital signs in last 24 hours: Temp:  [97.7 F (36.5 C)-98.8 F (37.1 C)] 98.8 F (37.1 C) (09/29 0720) Pulse Rate:  [69-86] 74 (09/29 0720) Resp:  [24-31] 31 (09/29 0720) BP: (94-112)/(53-68) 99/64 (09/29 0700) SpO2:  [92 %-99 %] 95 % (09/29 0720) Arterial Line BP: (93-121)/(48-61) 107/54 (09/29 0720) FiO2 (%):  [40 %] 40 % (09/29 0400) Weight:  [102.2 kg] 102.2 kg (09/29 0247) Last BM Date : 12/16/23  Intake/Output from previous day: 09/28 0701 - 09/29 0700 In: 5114.7 [I.V.:3869.9; NG/GT:440; IV Piggyback:799.9] Out: 3002 [Drains:185] Intake/Output this shift: No intake/output data recorded.  PE: Gen:  acutely ill appearing, on CRRT Card:  Regular rate and rhythm Pulm:  ventilated respirations Abd: firm, not ridid, moderate to severe distention, facial grimace to abdominal palpation, no rebound tenderness, drain to gravity appears serous Skin: warm and dry, no rashes  Psych: A&Ox3   Lab Results:  Recent Labs    12/18/23 0336 12/19/23 0417  WBC 9.8 12.9*  HGB 10.6* 10.5*  HCT 29.6* 29.6*  PLT 260 258   BMET Recent Labs    12/18/23 1615 12/19/23 0417  NA 131* 131*  K 4.1 4.2  CL 97* 99  CO2 22 21*  GLUCOSE 133* 141*  BUN 54* 47*  CREATININE 3.54* 2.99*  CALCIUM  7.3* 7.7*   PT/INR Recent Labs    12/16/23 1524  LABPROT 16.3*  INR 1.2   CMP     Component Value Date/Time   NA 131 (L) 12/19/2023 0417   NA 141 06/08/2022 1108   K 4.2 12/19/2023 0417   CL 99 12/19/2023 0417   CO2 21 (L) 12/19/2023 0417   GLUCOSE 141 (H) 12/19/2023 0417   BUN 47 (H) 12/19/2023 0417   BUN 16 06/08/2022 1108   CREATININE 2.99 (H) 12/19/2023 0417   CALCIUM  7.7 (L) 12/19/2023 0417   PROT 5.4 (L) 12/18/2023 0336   ALBUMIN  <1.5 (L) 12/19/2023 0417   AST 85 (H) 12/18/2023 0336    ALT 41 12/18/2023 0336   ALKPHOS 105 12/18/2023 0336   BILITOT 3.1 (H) 12/18/2023 0336   GFRNONAA 23 (L) 12/19/2023 0417   GFRAA >90 01/07/2014 0245   Lipase     Component Value Date/Time   LIPASE 31 07/16/2023 1753       Studies/Results: ECHOCARDIOGRAM COMPLETE Result Date: 12/18/2023    ECHOCARDIOGRAM REPORT   Patient Name:   Glenn Stewart Date of Exam: 12/17/2023 Medical Rec #:  [id number]        Height:       65.0 in Accession #:    [id number]       Weight:       228.8 lb Date of Birth:  December 01, 1965         BSA:          2.095 m Patient Age:    58 years         BP:           99/69 mmHg Patient Gender: M                HR:           95 bpm. Exam Location:  Inpatient Procedure: 2D Echo, Cardiac Doppler and Color Doppler (Both Spectral and Color  Flow Doppler were utilized during procedure). Indications:    Shock R57.9  History:        Patient has prior history of Echocardiogram examinations, most                 recent 05/10/2022. CAD and Angina, Prior CABG,                 Signs/Symptoms:Chest Pain; Risk Factors:Hypertension and                 Dyslipidemia.  Sonographer:    Thea Norlander RCS Referring Phys: VINA NOVAK SIMPSON IMPRESSIONS  1. Left ventricular ejection fraction, by estimation, is 50 to 55%. The left ventricle has low normal function. The left ventricle has no regional wall motion abnormalities. Left ventricular diastolic parameters were normal.  2. Right ventricular systolic function is normal. The right ventricular size is mildly enlarged. Tricuspid regurgitation signal is inadequate for assessing PA pressure.  3. The mitral valve is normal in structure. No evidence of mitral valve regurgitation. No evidence of mitral stenosis.  4. The aortic valve is normal in structure. Aortic valve regurgitation is not visualized. No aortic stenosis is present.  5. The inferior vena cava is normal in size with greater than 50% respiratory variability, suggesting right atrial  pressure of 3 mmHg. FINDINGS  Left Ventricle: Left ventricular ejection fraction, by estimation, is 50 to 55%. The left ventricle has low normal function. The left ventricle has no regional wall motion abnormalities. The left ventricular internal cavity size was normal in size. There is no left ventricular hypertrophy. Left ventricular diastolic parameters were normal. Right Ventricle: The right ventricular size is mildly enlarged. No increase in right ventricular wall thickness. Right ventricular systolic function is normal. Tricuspid regurgitation signal is inadequate for assessing PA pressure. Left Atrium: Left atrial size was normal in size. Right Atrium: Right atrial size was normal in size. Pericardium: There is no evidence of pericardial effusion. Mitral Valve: The mitral valve is normal in structure. No evidence of mitral valve regurgitation. No evidence of mitral valve stenosis. Tricuspid Valve: The tricuspid valve is normal in structure. Tricuspid valve regurgitation is trivial. No evidence of tricuspid stenosis. Aortic Valve: The aortic valve is normal in structure. Aortic valve regurgitation is not visualized. No aortic stenosis is present. Aortic valve peak gradient measures 7.8 mmHg. Pulmonic Valve: The pulmonic valve was normal in structure. Pulmonic valve regurgitation is not visualized. No evidence of pulmonic stenosis. Aorta: The aortic root is normal in size and structure. Venous: The inferior vena cava is normal in size with greater than 50% respiratory variability, suggesting right atrial pressure of 3 mmHg. IAS/Shunts: No atrial level shunt detected by color flow Doppler.  LEFT VENTRICLE PLAX 2D LVIDd:         4.40 cm   Diastology LVIDs:         3.20 cm   LV e' medial:    8.92 cm/s LV PW:         1.10 cm   LV E/e' medial:  10.5 LV IVS:        1.10 cm   LV e' lateral:   20.40 cm/s LVOT diam:     2.40 cm   LV E/e' lateral: 4.6 LV SV:         73 LV SV Index:   35 LVOT Area:     4.52 cm  RIGHT  VENTRICLE             IVC RV  S prime:     10.70 cm/s  IVC diam: 2.00 cm TAPSE (M-mode): 1.7 cm LEFT ATRIUM             Index        RIGHT ATRIUM           Index LA diam:        4.00 cm 1.91 cm/m   RA Area:     14.80 cm LA Vol (A2C):   33.1 ml 15.80 ml/m  RA Volume:   29.80 ml  14.23 ml/m LA Vol (A4C):   28.4 ml 13.56 ml/m LA Biplane Vol: 31.2 ml 14.89 ml/m  AORTIC VALVE AV Area (Vmax): 3.36 cm AV Vmax:        140.00 cm/s AV Peak Grad:   7.8 mmHg LVOT Vmax:      104.00 cm/s LVOT Vmean:     70.200 cm/s LVOT VTI:       0.161 m  AORTA Ao Root diam: 3.50 cm Ao Asc diam:  3.40 cm MITRAL VALVE MV Area (PHT): 4.21 cm    SHUNTS MV Decel Time: 180 msec    Systemic VTI:  0.16 m MV E velocity: 93.30 cm/s  Systemic Diam: 2.40 cm MV A velocity: 72.80 cm/s MV E/A ratio:  1.28 Oneil Parchment MD Electronically signed by Oneil Parchment MD Signature Date/Time: 12/18/2023/7:04:53 AM    Final    DG CHEST PORT 1 VIEW Result Date: 12/17/2023 CLINICAL DATA:  Central line placement EXAM: PORTABLE CHEST 1 VIEW COMPARISON:  12/17/2023 FINDINGS: Introduction of a LEFT central venous line with tip in the brachycephalic vein. No pneumothorax Endotracheal tube, NG tube, and RIGHT central venous line are unchanged. Midline sternotomy.  Low lung volumes.  LEFT effusion. IMPRESSION: 1. LEFT central venous line with tip in the brachiocephalic vein. No pneumothorax. 2. Additional support apparatus unchanged. 3. Low lung volumes and LEFT effusion. Electronically Signed   By: Jackquline Boxer M.D.   On: 12/17/2023 16:55   CT GUIDED PERITONEAL/RETROPERITONEAL FLUID DRAIN BY PERC CATH Result Date: 12/17/2023 INDICATION: [id number] Abdominal fluid collection [phone]. Concern for bladder perforation EXAM: CT-GUIDED RIGHT LOWER QUADRANT ABSCESS DRAINAGE CATHETER PLACEMENT COMPARISON:  CT cystogram and CT AP, 12/16/2023. MEDICATIONS: The patient is currently admitted to the hospital and receiving intravenous antibiotics. The antibiotics were administered  within an appropriate time frame prior to the initiation of the procedure. ANESTHESIA/SEDATION: Sedation by the ICU RN team was performed. Please see RN log for details. CONTRAST:  None FLUOROSCOPY TIME:  CT dose; 1011 mGycm COMPLICATIONS: None immediate. PROCEDURE: RADIATION DOSE REDUCTION: This exam was performed according to the departmental dose-optimization program which includes automated exposure control, adjustment of the mA and/or kV according to patient size and/or use of iterative reconstruction technique. Informed written consent was obtained from the patient and/or patient's representative after a discussion of the risks, benefits and alternatives to treatment. The patient was placed supine on the CT gantry and a pre procedural CT was performed re-demonstrating the known abscess/fluid collection within the RIGHT lower quad. The procedure was planned. A timeout was performed prior to the initiation of the procedure. The RIGHT lower quad was prepped and draped in the usual sterile fashion. The overlying soft tissues were anesthetized with 1% lidocaine  with epinephrine . Appropriate trajectory was planned with the use of a 22 gauge spinal needle. An 18 gauge trocar needle was advanced into the abscess/fluid collection and a short Amplatz super stiff wire was coiled within the collection. Appropriate positioning was confirmed with a  limited CT scan. The tract was serially dilated allowing placement of a 14 Fr Fr drainage catheter. Appropriate positioning was confirmed with a limited postprocedural CT scan. 30 mL of turbid fluid was aspirated. The tube was connected to a drainage bag and sutured in place. A dressing was placed. The patient tolerated the procedure well without immediate post procedural complication. IMPRESSION: Successful CT-guided placement of a 14 Fr drainage catheter into the RIGHT lower quadrant with aspiration of 30 mL mL of turbid fluid. Samples were sent to the laboratory as requested  by the ordering clinical team. RECOMMENDATIONS: The patient will return to Vascular Interventional Radiology (VIR) for routine drainage catheter evaluation and exchange in 7-10 days. Thom Hall, MD [facility] Electronically Signed   By: Thom Hall M.D.   On: 12/17/2023 14:04    Anti-infectives: Anti-infectives (From admission, onward)    Start     Dose/Rate Route Frequency Ordered Stop   12/17/23 1600  piperacillin -tazobactam (ZOSYN ) IVPB 2.25 g  Status:  Discontinued        2.25 g 100 mL/hr over 30 Minutes Intravenous Every 8 hours 12/17/23 0931 12/17/23 1144   12/17/23 1600  piperacillin -tazobactam (ZOSYN ) IVPB 3.375 g        3.375 g 100 mL/hr over 30 Minutes Intravenous Every 6 hours 12/17/23 1157     12/17/23 1230  piperacillin -tazobactam (ZOSYN ) IVPB 3.375 g  Status:  Discontinued        3.375 g 100 mL/hr over 30 Minutes Intravenous Every 6 hours 12/17/23 1144 12/17/23 1157   12/17/23 1000  linezolid  (ZYVOX ) IVPB 600 mg        600 mg 300 mL/hr over 60 Minutes Intravenous Every 12 hours 12/16/23 1924     12/17/23 0000  piperacillin -tazobactam (ZOSYN ) IVPB 3.375 g  Status:  Discontinued        3.375 g 12.5 mL/hr over 240 Minutes Intravenous Every 8 hours 12/16/23 1924 12/17/23 0931   12/16/23 1600  piperacillin -tazobactam (ZOSYN ) IVPB 3.375 g        3.375 g 12.5 mL/hr over 240 Minutes Intravenous  Once 12/16/23 1551 12/16/23 1602   12/16/23 1600  vancomycin  (VANCOREADY) IVPB 1750 mg/350 mL        1,750 mg 175 mL/hr over 120 Minutes Intravenous  Once 12/16/23 1551 12/16/23 1816        Assessment/Plan  Glenn Stewart is an 58 y.o. male with CAD (s/p CABG), afib, HTN, HLD, arrhythmias here following radical prostatectomy and lymph node dissection 11/17/2023 for prostate cancer - Does have reactive ileus vs pSBO in the setting of intra-abdominal fluid collections. Remains distended on exam without significant bowel  function; would recommend holding tube feeding; will do SBO protocol today to definitively exclude SBO and for therapeutic effect of contrast. Discussed with bedside RN - hook NG to LIWS for 1-2 hours and then give gastrografin per NGT. - fluid collections they do track all the way down into the deep pelvis along the psoas muscle which would be a rather atypical location for any sort of fluid collections originating from a bowel source although not impossible. Wall thickening of the ascending colon/flexure noted, favor reactive changes.  - CT cystogram shows contained leak at the anterior anastomosis - s/p IR drainage of RLQ/pelvic collection 9/27 (Cx abundant staph); 185 mL - drain creatinine consistent with urinoma/bladder leak; foley is in place per urology  - Broad-spectrum IV antibiotics as per critical care medicine  FEN: recommend holding tube feeds.  ID: Zosyn , [id number]  VTE: SCD's, SQH Dispo: ICU   LOS: 3 days   I reviewed nursing notes, Consultant urology notes, hospitalist notes, last 24 h vitals and pain scores, last 48 h intake and output, last 24 h labs and trends, and last 24 h imaging results.  This care required moderate level of medical decision making.   Almarie Pringle, PA-C [facility] Please see Amion for pager number during day hours 7:00am-4:30pm

## 2023-12-19 NOTE — Progress Notes (Signed)
 Admit: 12/16/2023 LOS: 3  18M with AKI after presenting with encephalopathy, A-fib with RVR/ventricular tachycardia, found to have likely abdominal urinomas with abscess.   Current CRRT Prescription: Start Date: 12/17/23 Catheter: L internal jugular Temp catheter placed 9/27 by CCM BFR: 250 Pre Blood Pump: 400 4K DFR: 1500 4K Replacement Rate: 400 4K Goal UF: -100 Anticoagulation: none Clotting: none   Subjective:  Remains in ICU.  Afebrile.  Intubated.  Sedated.  Levophed  off.  On antibiotics.  Restarting CRRT now.  Otherwise no prior issues. No UOP  K 4.2, phosphorus 3.3, sodium 131 Concern for SBO.  Pending imaging  09/28 0701 - 09/29 0700 In: 4918 [I.V.:3693.1; NG/GT:420; IV Piggyback:799.9] Out: 6802 [Drains:185]  Filed Weights   12/17/23 0414 12/18/23 0124 12/19/23 0247  Weight: 103.8 kg 103.5 kg 102.2 kg    Scheduled Meds:  amiodarone   150 mg Intravenous Once   bisacodyl   10 mg Rectal Daily   Chlorhexidine  Gluconate Cloth  6 each Topical Daily   feeding supplement (VITAL HIGH PROTEIN)  1,000 mL Per Tube Q24H   heparin  injection (subcutaneous)  5,000 Units Subcutaneous Q8H   insulin  aspart  0-15 Units Subcutaneous Q4H   mouth rinse  15 mL Mouth Rinse Q2H   pantoprazole  (PROTONIX ) IV  40 mg Intravenous Daily   sodium chloride  flush  5 mL Intracatheter Q8H   Continuous Infusions:  amiodarone  30 mg/hr (12/19/23 0600)   dextrose  5% lactated ringers  100 mL/hr at 12/19/23 0600   fentaNYL  infusion INTRAVENOUS 250 mcg/hr (12/19/23 0600)   linezolid  (ZYVOX ) IV Stopped (12/18/23 2344)   norepinephrine  (LEVOPHED ) Adult infusion 2 mcg/min (12/19/23 0600)   piperacillin -tazobactam (ZOSYN )  IV Stopped (12/19/23 0335)   prismasol BGK 4/2.5 400 mL/hr at 12/19/23 0609   prismasol BGK 4/2.5 400 mL/hr at 12/19/23 0608   prismasol BGK 4/2.5 1,500 mL/hr at 12/19/23 9357   propofol  (DIPRIVAN ) infusion 40 mcg/kg/min (12/19/23 0641)   PRN Meds:.fentaNYL , heparin , mouth rinse, sodium  chloride  Current Labs: reviewed    Physical Exam:  Blood pressure 98/66, pulse 74, temperature 98.8 F (37.1 C), resp. rate (!) 28, height 5' 5 (1.651 m), weight 102.2 kg, SpO2 96%. GEN: Intubated, sedated ENT: ET tube in place, NG tube in place CV: Regular, normal S1 and S2 PULM: Coarse breath sounds bilaterally ABD: Distended abdomen, grimaces to palpation. SKIN: no Rashes or lesions EXT: no LEE  A AKI, anuric, baseline normal GFR.  No evidence of obstruction on imaging at presentation.  Suspect ATN.  Repeat bladder pressure not consistent with abdominal compartment syndrome on 9/27. Hyponatremia, progressive likely from free water  excess; correcting appropriately on CRRT.  Improving Abdominal fluid collections concerning for urinoma after radical prostatectomy 11/14/2023; status post drain placement 9/27 by IR with more than 1 L output since placement Ileus versus SBO, surgery following, ileus favored VDRF A-fib with RVR and V. tach at presentation, cardiology following on amiodarone  Septic shock on linezolid  and Zosyn , weaning off pressors Hyperphosphatemia setting of AKI, improving Metabolic acidosis, improving  P Continue CRRT with all 4K bath and current flow settings Continue fluid removal at 50 to 100 mL net negative per hour Daily weights, Daily Renal Panel, Strict I/Os, Avoid nephrotoxins (NSAIDs, judicious IV Contrast)  Medication Issues; Preferred narcotic agents for pain control are hydromorphone , fentanyl , and methadone. Morphine  should not be used.  Baclofen should be avoided Avoid oral sodium phosphate  and magnesium  citrate based laxatives / bowel preps   Discussed with primary nurse  Dr. Korryn Pancoast M Atreus Hasz  12/19/2023, 6:59 AM  Recent Labs  Lab 12/18/23 0336 12/18/23 1615 12/19/23 0417  NA 127*  126* 131* 131*  K 4.1  4.1 4.1 4.2  CL 93*  91* 97* 99  CO2 20*  20* 22 21*  GLUCOSE 130*  129* 133* 141*  BUN 68*  68* 54* 47*  CREATININE 4.55*   4.39* 3.54* 2.99*  CALCIUM  7.0*  7.0* 7.3* 7.7*  PHOS 6.1* 4.0 3.3   Recent Labs  Lab 12/17/23 0333 12/17/23 0422 12/17/23 1037 12/18/23 0336 12/19/23 0417  WBC 4.8  --   --  9.8 12.9*  HGB 12.1*   < > 10.9* 10.6* 10.5*  HCT 35.4*   < > 32.0* 29.6* 29.6*  MCV 78.7*  --   --  76.7* 77.9*  PLT 303  --   --  260 258   < > = values in this interval not displayed.

## 2023-12-20 ENCOUNTER — Inpatient Hospital Stay (HOSPITAL_COMMUNITY)

## 2023-12-20 DIAGNOSIS — I4891 Unspecified atrial fibrillation: Secondary | ICD-10-CM | POA: Diagnosis not present

## 2023-12-20 DIAGNOSIS — N179 Acute kidney failure, unspecified: Secondary | ICD-10-CM | POA: Diagnosis not present

## 2023-12-20 DIAGNOSIS — A419 Sepsis, unspecified organism: Secondary | ICD-10-CM

## 2023-12-20 DIAGNOSIS — E8809 Other disorders of plasma-protein metabolism, not elsewhere classified: Secondary | ICD-10-CM

## 2023-12-20 DIAGNOSIS — R6521 Severe sepsis with septic shock: Secondary | ICD-10-CM | POA: Diagnosis not present

## 2023-12-20 LAB — CBC WITH DIFFERENTIAL/PLATELET
Abs Immature Granulocytes: 1.4 K/uL — ABNORMAL HIGH (ref 0.00–0.07)
Basophils Absolute: 0.1 K/uL (ref 0.0–0.1)
Basophils Relative: 1 %
Eosinophils Absolute: 0.3 K/uL (ref 0.0–0.5)
Eosinophils Relative: 2 %
HCT: 31.4 % — ABNORMAL LOW (ref 39.0–52.0)
Hemoglobin: 10.6 g/dL — ABNORMAL LOW (ref 13.0–17.0)
Immature Granulocytes: 10 %
Lymphocytes Relative: 4 %
Lymphs Abs: 0.5 K/uL — ABNORMAL LOW (ref 0.7–4.0)
MCH: 27.1 pg (ref 26.0–34.0)
MCHC: 33.8 g/dL (ref 30.0–36.0)
MCV: 80.3 fL (ref 80.0–100.0)
Monocytes Absolute: 0.5 K/uL (ref 0.1–1.0)
Monocytes Relative: 4 %
Neutro Abs: 10.9 K/uL — ABNORMAL HIGH (ref 1.7–7.7)
Neutrophils Relative %: 79 %
Platelets: 281 K/uL (ref 150–400)
RBC: 3.91 MIL/uL — ABNORMAL LOW (ref 4.22–5.81)
RDW: 15.6 % — ABNORMAL HIGH (ref 11.5–15.5)
Smear Review: NORMAL
WBC: 13.8 K/uL — ABNORMAL HIGH (ref 4.0–10.5)
nRBC: 0 % (ref 0.0–0.2)

## 2023-12-20 LAB — HEPATIC FUNCTION PANEL
ALT: 27 U/L (ref 0–44)
AST: 44 U/L — ABNORMAL HIGH (ref 15–41)
Albumin: <1.5 g/dL — ABNORMAL LOW (ref 3.5–5.0)
Alkaline Phosphatase: 122 U/L (ref 38–126)
Bilirubin, Direct: 0.8 mg/dL — ABNORMAL HIGH (ref 0.0–0.2)
Indirect Bilirubin: 0.9 mg/dL (ref 0.3–0.9)
Total Bilirubin: 1.7 mg/dL — ABNORMAL HIGH (ref 0.0–1.2)
Total Protein: 6.2 g/dL — ABNORMAL LOW (ref 6.5–8.1)

## 2023-12-20 LAB — RENAL FUNCTION PANEL
Albumin: <1.5 g/dL — ABNORMAL LOW (ref 3.5–5.0)
Albumin: <1.5 g/dL — ABNORMAL LOW (ref 3.5–5.0)
Albumin: <1.5 g/dL — ABNORMAL LOW (ref 3.5–5.0)
Anion gap: 13 (ref 5–15)
Anion gap: 10 (ref 5–15)
Anion gap: 15 (ref 5–15)
BUN: 45 mg/dL — ABNORMAL HIGH (ref 6–20)
BUN: 45 mg/dL — ABNORMAL HIGH (ref 6–20)
BUN: 48 mg/dL — ABNORMAL HIGH (ref 6–20)
CO2: 21 mmol/L — ABNORMAL LOW (ref 22–32)
CO2: 23 mmol/L (ref 22–32)
CO2: 22 mmol/L (ref 22–32)
Calcium: 7.7 mg/dL — ABNORMAL LOW (ref 8.9–10.3)
Calcium: 7.8 mg/dL — ABNORMAL LOW (ref 8.9–10.3)
Calcium: 7.8 mg/dL — ABNORMAL LOW (ref 8.9–10.3)
Chloride: 97 mmol/L — ABNORMAL LOW (ref 98–111)
Chloride: 98 mmol/L (ref 98–111)
Chloride: 98 mmol/L (ref 98–111)
Creatinine, Ser: 2.61 mg/dL — ABNORMAL HIGH (ref 0.61–1.24)
Creatinine, Ser: 2.67 mg/dL — ABNORMAL HIGH (ref 0.61–1.24)
Creatinine, Ser: 2.63 mg/dL — ABNORMAL HIGH (ref 0.61–1.24)
GFR, Estimated: 27 mL/min — ABNORMAL LOW (ref >60)
GFR, Estimated: 27 mL/min — ABNORMAL LOW (ref >60)
GFR, Estimated: 27 mL/min — ABNORMAL LOW (ref >60)
Glucose, Bld: 104 mg/dL — ABNORMAL HIGH (ref 70–99)
Glucose, Bld: 109 mg/dL — ABNORMAL HIGH (ref 70–99)
Glucose, Bld: 120 mg/dL — ABNORMAL HIGH (ref 70–99)
Phosphorus: 3.3 mg/dL (ref 2.5–4.6)
Phosphorus: 3.5 mg/dL (ref 2.5–4.6)
Phosphorus: 4.9 mg/dL — ABNORMAL HIGH (ref 2.5–4.6)
Potassium: 4.5 mmol/L (ref 3.5–5.1)
Potassium: 4.6 mmol/L (ref 3.5–5.1)
Potassium: 5.2 mmol/L — ABNORMAL HIGH (ref 3.5–5.1)
Sodium: 131 mmol/L — ABNORMAL LOW (ref 135–145)
Sodium: 131 mmol/L — ABNORMAL LOW (ref 135–145)
Sodium: 135 mmol/L (ref 135–145)

## 2023-12-20 LAB — GLUCOSE, CAPILLARY
Glucose-Capillary: 102 mg/dL — ABNORMAL HIGH (ref 70–99)
Glucose-Capillary: 102 mg/dL — ABNORMAL HIGH (ref 70–99)
Glucose-Capillary: 119 mg/dL — ABNORMAL HIGH (ref 70–99)
Glucose-Capillary: 120 mg/dL — ABNORMAL HIGH (ref 70–99)
Glucose-Capillary: 112 mg/dL — ABNORMAL HIGH (ref 70–99)
Glucose-Capillary: 119 mg/dL — ABNORMAL HIGH (ref 70–99)

## 2023-12-20 LAB — TRIGLYCERIDES
Triglycerides: 792 mg/dL — ABNORMAL HIGH (ref <150)
Triglycerides: 785 mg/dL — ABNORMAL HIGH (ref <150)

## 2023-12-20 LAB — MAGNESIUM: Magnesium: 2.8 mg/dL — ABNORMAL HIGH (ref 1.7–2.4)

## 2023-12-20 MED ORDER — BISACODYL 10 MG RE SUPP: 10.0000 mg | Freq: Once | RECTAL | Status: AC

## 2023-12-20 MED ORDER — HYDROMORPHONE HCL 1 MG/ML IJ SOLN
1.0000 mg | INTRAMUSCULAR | Status: DC | PRN
  Administered 2023-12-20: 2 mg via INTRAVENOUS
  Administered 2023-12-20 (×2): 3 mg via INTRAVENOUS
  Administered 2023-12-20: 2 mg via INTRAVENOUS
  Administered 2023-12-20 – 2023-12-21 (×3): 3 mg via INTRAVENOUS
  Administered 2023-12-21: 2 mg via INTRAVENOUS
  Administered 2023-12-21 – 2023-12-22 (×3): 3 mg via INTRAVENOUS
  Filled 2023-12-20: qty 2
  Filled 2023-12-20 (×3): qty 3
  Filled 2023-12-20 (×2): qty 2
  Filled 2023-12-20: qty 3
  Filled 2023-12-20: qty 2
  Filled 2023-12-20 (×2): qty 3
  Filled 2023-12-20: qty 1
  Filled 2023-12-20 (×2): qty 3

## 2023-12-20 MED ORDER — FUROSEMIDE 10 MG/ML IJ SOLN
100.0000 mg | Freq: Once | INTRAVENOUS | Status: AC
  Administered 2023-12-20: 100 mg via INTRAVENOUS
  Filled 2023-12-20: qty 10

## 2023-12-20 MED ORDER — HYDROMORPHONE HCL 1 MG/ML IJ SOLN: 1.0000 mg | INTRAMUSCULAR | Status: DC | PRN

## 2023-12-20 MED ORDER — DEXMEDETOMIDINE HCL IN NACL 400 MCG/100ML IV SOLN
0.0000 ug/kg/h | INTRAVENOUS | Status: DC
  Administered 2023-12-20: 1.2 ug/kg/h via INTRAVENOUS
  Administered 2023-12-20: 0.9 ug/kg/h via INTRAVENOUS
  Administered 2023-12-20: 0.4 ug/kg/h via INTRAVENOUS
  Administered 2023-12-20 – 2023-12-23 (×16): 1.2 ug/kg/h via INTRAVENOUS
  Administered 2023-12-23: 0.6 ug/kg/h via INTRAVENOUS
  Administered 2023-12-23: 1.2 ug/kg/h via INTRAVENOUS
  Filled 2023-12-20 (×10): qty 100
  Filled 2023-12-20: qty 200
  Filled 2023-12-20: qty 300
  Filled 2023-12-20: qty 200
  Filled 2023-12-20 (×5): qty 100

## 2023-12-20 MED ORDER — KETAMINE HCL-SODIUM CHLORIDE 1000-0.69 MG/100ML-% IV SOLN
0.5000 mg/kg/h | INTRAVENOUS | Status: DC
  Administered 2023-12-20: 0.3 mg/kg/h via INTRAVENOUS
  Administered 2023-12-21 – 2023-12-23 (×3): 0.5 mg/kg/h via INTRAVENOUS
  Filled 2023-12-20 (×4): qty 100

## 2023-12-20 NOTE — Progress Notes (Addendum)
 eLink Physician-Brief Progress Note Patient Name: Glenn Stewart DOB: [birth year] MRN: [id number]   Date of Service  12/20/2023  HPI/Events of Note  58 year old male with past medical history of CAD, CABG, prior A-fib, NASH who had recent robotic radial prostatectomy on 11/17/2023 presented with altered mental status.    Patient is on ketamine  for pain control in the setting of an ileus, question of increasing it.  Has as needed Dilaudid  available  And agitated on the ventilator, restraints requested  Fever to 101.1.  Abscess with Staphylococcus aureus, urine with Enterobacter.  On cefepime/metronidazole   eICU Interventions  Maintain ketamine  and as needed Dilaudid   Add restraints for patient safety  Continue nonpharmacological antipyresis, blood cultures   2343 - agitated, biting tube, maxed on precedex , giving dilaudid  pushes with last at 2322; increase ketamine  to 0.5  Intervention Category Minor Interventions: Routine modifications to care plan (e.g. PRN medications for pain, fever)  Glenn Stewart 12/20/2023, 9:32 PM

## 2023-12-20 NOTE — Progress Notes (Signed)
 I reviewed the patient's telemetry today and he had a 3 beat run of NSVT.  No current changes to his amiodarone  needed at this time.  Cardiology will continue to follow peripherally.  Glenn Stewart T. Floretta HEATH, MD [facility]

## 2023-12-20 NOTE — Progress Notes (Signed)
 Pt placed back on full support due to RR>40 and increased WOB. Pt tolerating well at this time,RN aware, CCM notifed.

## 2023-12-20 NOTE — Progress Notes (Signed)
 Updated Mr. Sheperd's sister, Lucian Pond: [phone]

## 2023-12-20 NOTE — Progress Notes (Addendum)
 NAME:  Glenn Stewart, MRN:  [id number], DOB:  [birth year], LOS: 4 ADMISSION DATE:  12/16/2023, CONSULTATION DATE:  12/16/23 REFERRING MD:  Dr. Cottie, CHIEF COMPLAINT:  weakness   bRIEF  HPI obtained from EMR as pt is intubated and sedated.   69 yoM with PMH of HTN, CAD, CABG x 5 04/2022 with post op Afib since stopped on Eliquis  and amiodarone  09/2022, HTN, HLD, GERD, NASH, bipolar/ ADHD and s/p recent robotic assisted laparoscopic radial prostatectomy on 11/17/23 who presented for weakness and waxing mental status with distended and tender abdominal by EMS found with episodes of Afib with RVR and VT.  Was hemodynamically unstable on arrival, underwent emergent cardioversion and given amiodarone  x2 and magnesium , with emerging rhythm of afib with RVR possible flutter.  Cardiology consulted. Post cardioversion became hypotensive with respiratory distress therefore intubated and placed on norepinephrine .  Difficulty oxygenating post intubation with normal PIP requiring 1.0 and 12 PEEP.  Labs noted for K2.8, Na 129, bicarb 16, BUN/ sCr 55/ 3.92 (recently  ), t. Bili 3.4, AST 68, albumin  1.6, protein 3.4, INR 1.2.  CTH neg, CTA PE and CT a/p w/contrast pending official read.  2.1L of brownish OGT contents s/p OGT placement. Cultures sent, empiric zosyn / vanc started.  Afebrile, anuric and since weaned off pressors.  R internal jugular CVL placed by EDP.  PCCM consulted for admission.     NOTE:LD him as outpatient, patient self-irrigated catheter a couple days after initial surgery and then removed catheter himself prior to follow-up at 1 week. Patient reported that he cut tubing prior to removal but unable to confirm if removal was truly atraumat   Pertinent  Medical History    has a past medical history of ADHD (attention deficit hyperactivity disorder), Anxiety state, unspecified, Bipolar 1 disorder (HCC), Cancer (HCC), CHEST PAIN (12/04/2008), Coronary artery disease, Depressive disorder, not elsewhere  classified, Dermatophytosis of nail, Esophageal reflux, History of kidney stones, HYPERGLYCEMIA (07/10/2007), Low back pain (09/18/2013), Other chronic nonalcoholic liver disease, Pre-diabetes, Problems with hearing, Prostatitis, unspecified, Pure hypercholesterolemia, Screen for STD (sexually transmitted disease) (04/20/2011), Sprain of cruciate ligament of knee, Tear of medial cartilage or meniscus of knee, current, TMJ (temporomandibular joint disorder) (12/25/2010), Unspecified essential hypertension, Unspecified gastritis and gastroduodenitis without mention of hemorrhage, and Urethritis, unspecified.   has a past surgical history that includes No prior surgery; LEFT HEART CATH AND CORONARY ANGIOGRAPHY (N/A, 05/10/2022); Coronary artery bypass graft (N/A, 05/12/2022); Radial artery harvest (Left, 05/12/2022); TEE without cardioversion (N/A, 05/12/2022); Cystoscopy w/ litholapaxy / EHL; and Robot assisted laparoscopic radical prostatectomy (N/A, 11/17/2023).   Significant Hospital Events: Including procedures, antibiotic start and stop dates in addition to other pertinent events   9/26> admit 9/27: 90% fio2 on ventilator. On diprivan  gtt, fent gtt, amio gtt. CVP 9.  AFebrile. Cutlure negative so far. cReat 5.13 and climbing (Baseline 0.91 on 8/292/25). AKI started 12/16/23. Normal lactate byu Ionized calcim is low. Seen by urology  & CCS- recommending  IR attemt of intrabdominal abscess that is in RLQ and tracks down bladder..  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 CCS does not think there is appendicit or diveritculits. And that SBO/Colitis is secondary to urinoma. Off levop and followed commands. aNURIC - 100cc only since admit 9/27 CT IMPRESSION: . There is focal irregularity at the junction of the bladder base and urethra with small anterior contrast pooling suggesting contained perforation. No intraperitoneal contrast extravasation. Appearance examination is otherwise unchanged. Again  demonstrated re multiple loculated fluid collections  in the abdomen and pelvis onsistent with abscesses. Wall thickening of the cecum and terminal ileum. Fluid-filled dilated small bowel appear mildly improved.  Consolidation or atelectasis in both lung bases, possibly pneumonia, compressive atelectasis, or aspiration. Small bilateral pleural effusions  . Wall thickening of the ascending colon and hepatic flexure, new since prior study. This may indicate colitis or inflammatory bowel disease versus reactive change. Dilated fluid-filled small bowel with air-fluid levels and mild diffuse wall thickening. Terminal ileum is decompressed. This is consistent with small-bowel obstruction, possibly due to inflammatory strictures. IR Procedure: DRAINAGE CATHETER PLACEMENT into a RIGHT LOWER QUADRANT FLUID COLLECTION ->> 1L drained HD CATH Placed CRRT started 9/28 - On vent. Ongoing CRRT. Seen by cCS  - still concernd about Urinoma and secondary blowe irritation and not a true obstruction NGT output is low c/w ileus. CCS does  NOT want enteral medication. Recommnding max only trophic feds. CUlture negative. Afebrile.  Stopped on Zyvox  and Zosyn  9/29 SBO Xr protocol per Gen Surgery with oral contrast pooling in the gastric fundus. MSSA in abdominal cultures and Enterobacter cloacae in Ucx - on Cefepime and Flagyl       Interim History / Subjective:   Continues to be NPO per General surgery; awaiting morning evaluation for small bowel obstruction evaluation. Tolerating fluid removal via CRRT. Endorsing increased abdominal pain on all quadrants.   Objective    Blood pressure 121/68, pulse 79, temperature 99.1 F (37.3 C), resp. rate 20, height 5' 5 (1.651 m), weight 102.5 kg, SpO2 97%. CVP:  [6 mmHg-15 mmHg] 7 mmHg  Vent Mode: PRVC FiO2 (%):  [40 %] 40 % Set Rate:  [20 bmp-28 bmp] 20 bmp Vt Set:  [490 mL] 490 mL PEEP:  [5 cmH20] 5 cmH20 Plateau Pressure:  [9 cmH20-21 cmH20] 9 cmH20   Intake/Output  Summary (Last 24 hours) at 12/20/2023 0705 Last data filed at 12/20/2023 0700 Gross per 24 hour  Intake 2873.01 ml  Output 5338 ml  Net -2464.99 ml  4L removed on CRRT 150 ml from drain 1L from OG tube prior to clamping [employer]   12/18/23 0124 12/19/23 0247 12/20/23 0500  Weight: 103.5 kg 102.2 kg 102.5 kg    Examination:  General Appearance: Ill appearing male Head:  Normocephalic, without obvious abnormality, atraumatic Eyes:  Improvement in jaundiced sclera Throat:  ETT TUBE - yes , OG tube - yes Lungs: clear to auscultation Heart:  RRR without murmur Abdomen:  distended, tender to tenderness throughout. +BS Extremities: non edematous Skin:  dry and warm Neurologic: opens eyes, sticks tongue out,   Labs reviewed: WBC 12.9>13.8  Assessment and Plan   Septic shock, resolved Prostate cancer s/p robotic assisted laparoscopic radial prostatectomy on 11/17/23 S/p IR drainage on 9/27 Enterobacter cloacae in Urine Cx Abdominal abscess with MSSA - Stopped on Zyvox  (9/27 - - Stopped Zosyn  (9/26 - - Switch to cefepime and flagyl  9/29 - - Off fluids at of this morning - Appreciate Urology recommendations and guidance  Ileus vs pSBO intraabdominal fluid collections and abscess at anterior anastomosis Distal colon enterocolitis In pain, distended, with +BS today. KUB overnight with 5cm small bowel dilatation without transition point. Suspect ileus. Repeat KUB this am with persistent colonic distention  Awaiting Gen surgery review and recommendations today - Continue on GNR and anaerobic coverage - Challenging pain control. Currently requiring high doses of fentanyl   - Will transition off fentanyl  to Ketamine  which could be used if extubated  Acute hypoxic and hypercarbic respiratory failure suspected early  aspiration of BLL PNA/ pneumonitis concerning for developing early ARDS/ALI - - CTA PE neg for acute PE. Blood gas reviewed this AM - Abx for possible pna coverage  9/26 - - PRVC with VAP bundle - Reassess daily - ABG as needed with vent changes - Tryglycerides 792 (271 3 days ago); will switch from propofol  today  Afib VT, monomorphic Hx CAD, prior CABG x 5 04/2022, and post op Afib (off amio/ eliquis  09/2022), HTN, HLD - s/p emergent cardioversion during this admission - Will continue on amiodarone  drip - Admission echo ef 55% without wall motion abnormalities - appreciate cardiology recommendations  CRRT dependent AKI since 9/27 Anuric No prior kidney disease. Cr at base No hydronephrosis on CT, bladder decompressed. Tolerating fluid removal. Cr improving.  - Will attempt to transition to HD after trial of Lasix  - Appreciate nephrology recommendations  Acute on chronic anemia  Critical illness 10.6 today. stable - Continue to monitor - Transfuse for Hgb <8  Elevated AST Elevated T bili Likely in setting of urosepsis. Improving - Awaiting labs  Neuro/Encephalopathy  - On sedation  Best practice:  Diet: Will need TPN given ileus Pain/Anxiety/Delirium protocol (if indicated): Diprivan  gtt and Fent gtt VAP protocol (if indicated): YES DVT prophylaxis: heparin  5000 tid 12/17/23 GI prophylaxis: PPI Glucose control: ssi Mobility: bed rest Code Status: full code DISPOSITION: ICU  Family Communication:   - Called Mom Rock Spanner [phone] - she said she is 74 and deferred that I called daughter but did inform her that he is on life support  - Called Sister Lucian Pond [phone]- called and  updated 9:22 AM 12/17/2023 (she is in [place] and planning to come). She 0  - 12/18/23:   Called  sister Lucian Pond - and updated  - 9/29: attempted without success.  - 9/30: will attempt today   SIGNATURE   Hadassah Kristy Ahr, MD [facility] Internal Medicine Program - PGY-3 12/20/2023, 7:05 AM  LABS    PULMONARY Recent Labs  Lab 12/16/23 2124 12/17/23 0422 12/17/23 0908 12/17/23 1002 12/17/23 1037 12/19/23 1035   PHART 7.301* 7.292* 7.346*  --  7.349* 7.383  PCO2ART 41.3 42.5 36.8  --  34.2 40.3  PO2ART 102 95 116*  --  91 78*  HCO3 20.4 20.6 20.1  --  18.8* 24.0  TCO2 22 22 21*  --  20* 25  O2SAT 97 97 98 77.7 97 95    CBC Recent Labs  Lab 12/18/23 0336 12/19/23 0417 12/19/23 1035 12/20/23 0414  HGB 10.6* 10.5* 10.5* 10.6*  HCT 29.6* 29.6* 31.0* 31.4*  WBC 9.8 12.9*  --  13.8*  PLT 260 258  --  281    COAGULATION Recent Labs  Lab 12/16/23 1524  INR 1.2    CARDIAC  No results for input(s): TROPONINI in the last 168 hours. No results for input(s): PROBNP in the last 168 hours.   CHEMISTRY Recent Labs  Lab 12/16/23 1524 12/16/23 1535 12/17/23 0333 12/17/23 0422 12/18/23 0336 12/18/23 1615 12/19/23 0417 12/19/23 1035 12/19/23 1555 12/20/23 0414  NA 129*   < > 125*   < > 127*  126* 131* 131* 132* 131* 131*  K 2.8*   < > 4.3   < > 4.1  4.1 4.1 4.2 4.2 4.5 4.5  CL 96*   < > 87*   < > 93*  91* 97* 99  --  97* 97*  CO2 16*   < > 17*   < >  20*  20* 22 21*  --  22 21*  GLUCOSE 118*   < > 121*   < > 130*  129* 133* 141*  --  100* 104*  BUN 55*   < > 74*   < > 68*  68* 54* 47*  --  45* 45*  CREATININE 3.92*   < > 5.13*   < > 4.55*  4.39* 3.54* 2.99*  --  2.74* 2.61*  CALCIUM  6.3*   < > 7.0*   < > 7.0*  7.0* 7.3* 7.7*  --  7.6* 7.7*  MG 2.3  --  2.9*  --  2.8*  --  3.0*  --   --  2.8*  PHOS 7.3*  --  11.7*   < > 6.1* 4.0 3.3  --  3.4 3.3   < > = values in this interval not displayed.   Estimated Creatinine Clearance: 33.6 mL/min (A) (by C-G formula based on SCr of 2.61 mg/dL (H)).   LIVER Recent Labs  Lab 12/16/23 1524 12/17/23 0333 12/17/23 1804 12/18/23 0336 12/18/23 1615 12/19/23 0416 12/19/23 0417 12/19/23 1555 12/20/23 0414  AST 68* 83*  --  85*  --  62*  --   --  44*  ALT 37 43  --  41  --  34  --   --  27  ALKPHOS 69 85  --  105  --  116  --   --  122  BILITOT 3.4* 3.9*  --  3.1*  --  2.3*  --   --  1.7*  PROT 5.2* 5.6*  --  5.4*  --  5.6*   --   --  6.2*  ALBUMIN  1.6* 1.5*   < > <1.5*  <1.5* <1.5* <1.5* <1.5* <1.5* <1.5*  <1.5*  INR 1.2  --   --   --   --   --   --   --   --    < > = values in this interval not displayed.     INFECTIOUS Recent Labs  Lab 12/16/23 1600 12/16/23 1839 12/16/23 2023  LATICACIDVEN 3.5* 1.2 1.3     ENDOCRINE CBG (last 3)  Recent Labs    12/19/23 1943 12/19/23 2313 12/20/23 0341  GLUCAP 94 96 102*     IMAGING x48h  - image(s) personally visualized  -   highlighted in bold DG Abd Portable 1V-Small Bowel Obstruction Protocol-initial, 8 hr delay Result Date: 12/19/2023 CLINICAL DATA:  8 hour delay radiograph. EXAM: PORTABLE ABDOMEN - 1 VIEW COMPARISON:  CT abdomen pelvis dated 12/16/2023. FINDINGS: Evaluation is limited due to body habitus and patient's positioning. Oral contrast noted pooling in the gastric fundus. No definite contrast noted in the small bowel or colon. Dilated air-filled loops of small bowel measure up to 5.5 cm in caliber. Continued follow-up recommended. A pigtail drainage catheter noted in the right lower quadrant. IMPRESSION: 1. Oral contrast pooling in the gastric fundus. No definite contrast noted in the small bowel or colon. 2. Dilated air-filled loops of small bowel. Electronically Signed   By: Vanetta Chou M.D.   On: 12/19/2023 20:09   DG CHEST PORT 1 VIEW Result Date: 12/19/2023 CLINICAL DATA:  Respiratory failure. EXAM: PORTABLE CHEST 1 VIEW COMPARISON:  12/17/2023. FINDINGS: Endotracheal tube tip terminates approximately 4 cm above the carina. Similarly position right IJ CVC catheter tip in the right atrium and the left CVC catheter tip overlying the brachiocephalic vein. No pneumothorax. Stable cardiomediastinal contours status post median sternotomy  and CABG. Similar small left pleural effusion. IMPRESSION: 1. Similarly position life-support apparatus, as above. 2. Small left pleural effusion, not significantly changed. Electronically Signed   By: Harrietta Sherry M.D.   On: 12/19/2023 08:21

## 2023-12-20 NOTE — Consult Note (Signed)
 This patient did well from his initial surgery (robotic prostatectomy) which was uncomplicated and routine.  He was discharged home on POD #1 with his foley catheter.  Initially there was no concern for his anastomosis of the bladder and urethra.    However, I was concerned about his manic behavior. He was extremely active with pressured speech and with very poor insight.  He essentially refused to acknowledge that he had just had a major surgery and had plans to get back to work quickly and do all the things he was doing before surgery.  He was discharged home with his family with plans [place] his mom.  Over the first weekend (POD#4) he was likely have bladder spasms.  He perceived this to reflect an occluded catheter and took it upon himself to irrigate the catheter.  This was unsuccessful so he then cut the foley and removed it himself.  (4 days earlier than scheduled).  When we saw him in clinic he had little insight but fortunately appeared to doing okay.  At that time we gave him a 3 day course of antibiotics which is standard after this type of surgery around the time of the foley removal.  He was noted to have some mild abdominal distention and very swollen scrotum/testicles.  I followed up with him over the phone and he appeared to be doing okay, was voiding and having little to no pain.  He then presented 10 days later and looked much worse, distended, ashen, and had significant confusion.  He subsequently had an event in our waiting room requiring EMS to be called.  At that point he was sent to the hospital.  All this to say he is likely has undiagnosed bipolar disorder and the surgery appeared to have triggered a manic phase.  He had very little insight into his condition, did not take care of himself well nor did he really seek care, removed his catheter 4 days ahead of schedule and ultimately ended up with a large urinoma.  I appreciate all the care provided by  the multi-disciplined team.  We will plan to leave his foley in for the next 7-10 days and repeat a cystogram at that time prior to removing his catheter.  We will continue to follow.

## 2023-12-20 NOTE — TOC Initial Note (Addendum)
 Transition of Care [facility]) - Initial/Assessment Note    Patient Details  Name: Glenn Stewart MRN: [id number] Date of Birth: 04-Jan-1966  Transition of Care [facility]) CM/SW Contact:    Lauraine FORBES Saa, LCSWA Phone Number: 12/20/2023, 12:17 PM  Clinical Narrative:                  12:17 PM CSW introduced self and role to patient's sister, Randine (patient is currently intubated; CSW attempted to call patient's mother and other sister, Lucian, but there were no responses and voicemail's were left). Randine confirmed patient resides at home alone and drove self to appointments prior to admission but that family could provide transportation at discharge. Randine confirmed patient does not have SNF/HH/DME history. Per chart review, patient has a PCP and insurance. Patient's preferred pharmacy's are [facility] and [facility] [address]. No TOC needs identified at this time. TOC will continue to follow and be available to assist.  Expected Discharge Plan: Home/Self Care Barriers to Discharge: Continued Medical Work up   Patient Goals and CMS Choice            Expected Discharge Plan and Services       Living arrangements for the past 2 months: Single Family Home                                      Prior Living Arrangements/Services Living arrangements for the past 2 months: Single Family Home Lives with:: Self Patient language and need for interpreter reviewed:: Yes        Need for Family Participation in Patient Care: Yes (Comment)     Criminal Activity/Legal Involvement Pertinent to Current Situation/Hospitalization: No - Comment as needed  Activities of Daily Living   ADL Screening (condition at time of admission) Independently performs ADLs?: Yes (appropriate for developmental age) Is the patient deaf or have difficulty hearing?: No Does the patient have difficulty seeing, even when wearing glasses/contacts?: No Does the patient have difficulty  concentrating, remembering, or making decisions?: No  Permission Sought/Granted Permission sought to share information with : Family Supports Permission granted to share information with : No  Share Information with NAME: Randine Galla     Permission granted to share info w Relationship: Sister  Permission granted to share info w Contact Information: [phone]  Emotional Assessment   Attitude/Demeanor/Rapport: Unable to Assess, Intubated (Following Commands or Not Following Commands) Affect (typically observed): Unable to Assess   Alcohol / Substance Use: Not Applicable Psych Involvement: No (comment)  Admission diagnosis:  Respiratory insufficiency [R06.89] Shock (HCC) [R57.9] Generalized abdominal pain [R10.84] Atrial fibrillation with RVR (HCC) [I48.91] Patient Active Problem List   Diagnosis Date Noted   Atrial fibrillation with RVR (HCC) 12/19/2023   History of abdominal prostatectomy 12/17/2023   Pelvic abscess in male [facility]) 12/17/2023   AKI (acute kidney injury) 12/17/2023   Class 2 obesity with body mass index (BMI) of 35 to 39.9 without comorbidity 12/17/2023   Acute respiratory failure with hypoxia (HCC) 12/17/2023   Shock (HCC) 12/16/2023   Prostate cancer (HCC) 11/17/2023   S/P CABG x 5 05/12/2022   Chest pain, rule out acute myocardial infarction 05/10/2022   Facial droop 05/10/2022   Unstable angina (HCC) 05/10/2022   Hand paresthesia 04/03/2020   Frequent urination 04/03/2020   Prostate cancer screening 04/03/2020   CAD (coronary artery disease) 04/01/2020   Low back pain  09/18/2013   Screen for STD (sexually transmitted disease) 04/20/2011   TMJ (temporomandibular joint disorder) 12/25/2010   Kidney stones 09/22/2010   Hematuria 09/22/2010   Routine general medical examination at a health care facility 07/22/2010   TINEA VERSICOLOR 10/29/2009   MEDIAL MENISCUS TEAR, LEFT 05/31/2008   ACL TEAR, LEFT KNEE 05/31/2008   Pain in joint, forearm  05/28/2008   PROBLEMS WITH HEARING 03/18/2008   Prediabetes 07/10/2007   DERMATOPHYTOSIS OF NAIL 04/10/2007   Dyslipidemia, goal LDL below 70 09/06/2006   Anxiety state 09/06/2006   DEPRESSION 09/06/2006   Essential hypertension 09/06/2006   GERD 09/06/2006   FATTY LIVER DISEASE 09/06/2006   PCP:  Pcp, No Pharmacy:   [facility] [address] [place] - [address] [place] [address] Phone: [phone] Fax: [phone]  [facility] [address] [place] [address] Phone: [phone] Fax: [phone]     Social Drivers of Health (SDOH) Social History: SDOH Screenings   Food Insecurity: No Food Insecurity (11/17/2023)  Housing: Low Risk  (11/17/2023)  Transportation Needs: No Transportation Needs (11/17/2023)  Utilities: Not At Risk (11/17/2023)  Tobacco Use: Low Risk  (12/16/2023)   SDOH Interventions:     Readmission Risk Interventions    05/19/2022   12:57 PM  Readmission Risk Prevention Plan  Transportation Screening Complete  PCP or Specialist Appt within 5-7 Days Complete  Home Care Screening Complete  Medication Review (RN CM) Complete

## 2023-12-20 NOTE — Progress Notes (Signed)
 [facility] Progress Note     Subjective: CC:  Intubated, sedated.  Remains off of pressor support, on CRRT  NGT - 1,350/24h  WBC 13.8 from 12.9, overall stable On Zosyn   Objective: Vital signs in last 24 hours: Temp:  [98.4 F (36.9 C)-99.5 F (37.5 C)] 99.1 F (37.3 C) (09/30 0715) Pulse Rate:  [75-87] 79 (09/30 0740) Resp:  [18-28] 21 (09/30 0740) BP: (109-137)/(68-78) 121/68 (09/30 0400) SpO2:  [93 %-98 %] 96 % (09/30 0807) Arterial Line BP: (101-174)/(44-75) 140/61 (09/30 0710) FiO2 (%):  [40 %] 40 % (09/30 0807) Weight:  [102.5 kg] 102.5 kg (09/30 0500) Last BM Date : 12/16/23  Intake/Output from previous day: 09/29 0701 - 09/30 0700 In: 2873 [I.V.:2093.4; NG/GT:24.7; IV Piggyback:749.9] Out: 5551 [Urine:5; Emesis/NG output:1050; Drains:150] Intake/Output this shift: Total I/O In: 151.6 [I.V.:151.6] Out: 510 [Urine:10; Emesis/NG output:300; Drains:20]  PE: Gen:  acutely ill appearing, on CRRT Card:  Regular rate and rhythm Pulm:  ventilated respirations Abd: soft, moderate distention, interval decrease in distention compared to exam yesterday, facial grimace to abdominal palpation, no rebound tenderness, drain to gravity appears serous Skin: warm and dry, no rashes; bleeding from R radial A-line; RN applying direct pressure Psych: unable to assess  Lab Results:  Recent Labs    12/19/23 0417 12/19/23 1035 12/20/23 0414  WBC 12.9*  --  13.8*  HGB 10.5* 10.5* 10.6*  HCT 29.6* 31.0* 31.4*  PLT 258  --  281   BMET Recent Labs    12/19/23 1555 12/20/23 0414  NA 131* 131*  K 4.5 4.5  CL 97* 97*  CO2 22 21*  GLUCOSE 100* 104*  BUN 45* 45*  CREATININE 2.74* 2.61*  CALCIUM  7.6* 7.7*   PT/INR No results for input(s): LABPROT, INR in the last 72 hours.  CMP     Component Value Date/Time   NA 131 (L) 12/20/2023 0414   NA 141 06/08/2022 1108   K 4.5 12/20/2023 0414   CL 97 (L) 12/20/2023 0414   CO2 21 (L) 12/20/2023 0414    GLUCOSE 104 (H) 12/20/2023 0414   BUN 45 (H) 12/20/2023 0414   BUN 16 06/08/2022 1108   CREATININE 2.61 (H) 12/20/2023 0414   CALCIUM  7.7 (L) 12/20/2023 0414   PROT 6.2 (L) 12/20/2023 0414   ALBUMIN  <1.5 (L) 12/20/2023 0414   ALBUMIN  <1.5 (L) 12/20/2023 0414   AST 44 (H) 12/20/2023 0414   ALT 27 12/20/2023 0414   ALKPHOS 122 12/20/2023 0414   BILITOT 1.7 (H) 12/20/2023 0414   GFRNONAA 27 (L) 12/20/2023 0414   GFRAA >90 01/07/2014 0245   Lipase     Component Value Date/Time   LIPASE 31 07/16/2023 1753       Studies/Results: DG Abd Portable 1V Result Date: 12/20/2023 CLINICAL DATA:  Small-bowel obstruction. EXAM: PORTABLE ABDOMEN - 1 VIEW COMPARISON:  12/19/2023 FINDINGS: Pigtail catheter again seen in the right lower quadrant. Nasogastric tube is seen with tip in the gastric body. Moderate to markedly dilated small bowel loops are again seen in the central and left abdomen, without significant change since previous study. No significant colonic gas or stool seen. These findings remain consistent with a small-bowel obstruction. IMPRESSION: No apparent change in small-bowel obstruction since prior study. Nasogastric tube tip in gastric body. Electronically Signed   By: Norleen DELENA Kil M.D.   On: 12/20/2023 09:15   DG Abd Portable 1V-Small Bowel Obstruction Protocol-initial, 8 hr delay Result Date: 12/19/2023 CLINICAL DATA:  8 hour delay radiograph.  EXAM: PORTABLE ABDOMEN - 1 VIEW COMPARISON:  CT abdomen pelvis dated 12/16/2023. FINDINGS: Evaluation is limited due to body habitus and patient's positioning. Oral contrast noted pooling in the gastric fundus. No definite contrast noted in the small bowel or colon. Dilated air-filled loops of small bowel measure up to 5.5 cm in caliber. Continued follow-up recommended. A pigtail drainage catheter noted in the right lower quadrant. IMPRESSION: 1. Oral contrast pooling in the gastric fundus. No definite contrast noted in the small bowel or colon. 2.  Dilated air-filled loops of small bowel. Electronically Signed   By: Vanetta Chou M.D.   On: 12/19/2023 20:09   DG CHEST PORT 1 VIEW Result Date: 12/19/2023 CLINICAL DATA:  Respiratory failure. EXAM: PORTABLE CHEST 1 VIEW COMPARISON:  12/17/2023. FINDINGS: Endotracheal tube tip terminates approximately 4 cm above the carina. Similarly position right IJ CVC catheter tip in the right atrium and the left CVC catheter tip overlying the brachiocephalic vein. No pneumothorax. Stable cardiomediastinal contours status post median sternotomy and CABG. Similar small left pleural effusion. IMPRESSION: 1. Similarly position life-support apparatus, as above. 2. Small left pleural effusion, not significantly changed. Electronically Signed   By: Harrietta Sherry M.D.   On: 12/19/2023 08:21    Anti-infectives: Anti-infectives (From admission, onward)    Start     Dose/Rate Route Frequency Ordered Stop   12/19/23 1045  ceFEPIme (MAXIPIME) 2 g in sodium chloride  0.9 % 100 mL IVPB        2 g 200 mL/hr over 30 Minutes Intravenous Every 12 hours 12/19/23 0955     12/19/23 1045  metroNIDAZOLE  (FLAGYL ) IVPB 500 mg        500 mg 100 mL/hr over 60 Minutes Intravenous Every 12 hours 12/19/23 0955     12/17/23 1600  piperacillin -tazobactam (ZOSYN ) IVPB 2.25 g  Status:  Discontinued        2.25 g 100 mL/hr over 30 Minutes Intravenous Every 8 hours 12/17/23 0931 12/17/23 1144   12/17/23 1600  piperacillin -tazobactam (ZOSYN ) IVPB 3.375 g  Status:  Discontinued        3.375 g 100 mL/hr over 30 Minutes Intravenous Every 6 hours 12/17/23 1157 12/19/23 0955   12/17/23 1230  piperacillin -tazobactam (ZOSYN ) IVPB 3.375 g  Status:  Discontinued        3.375 g 100 mL/hr over 30 Minutes Intravenous Every 6 hours 12/17/23 1144 12/17/23 1157   12/17/23 1000  linezolid  (ZYVOX ) IVPB 600 mg  Status:  Discontinued        600 mg 300 mL/hr over 60 Minutes Intravenous Every 12 hours 12/16/23 1924 12/19/23 0958   12/17/23 0000   piperacillin -tazobactam (ZOSYN ) IVPB 3.375 g  Status:  Discontinued        3.375 g 12.5 mL/hr over 240 Minutes Intravenous Every 8 hours 12/16/23 1924 12/17/23 0931   12/16/23 1600  piperacillin -tazobactam (ZOSYN ) IVPB 3.375 g        3.375 g 12.5 mL/hr over 240 Minutes Intravenous  Once 12/16/23 1551 12/16/23 1602   12/16/23 1600  vancomycin  (VANCOREADY) IVPB 1750 mg/350 mL        1,750 mg 175 mL/hr over 120 Minutes Intravenous  Once 12/16/23 1551 12/16/23 1816        Assessment/Plan  Glenn Stewart is an 58 y.o. male with CAD (s/p CABG), afib, HTN, HLD, arrhythmias here following radical prostatectomy and lymph node dissection 11/17/2023 for prostate cancer - Does have reactive ileus vs pSBO in the setting of intra-abdominal fluid collections. Remains distended on  exam without significant bowel function; SBO protocol 9/29 >> contrast was in stomach on 8h delay, not visble on x-ray today with persistent gaseous distention of the small bowel, as well as gas in the colon. This is consistent with suspected ileus, cannot exclude pSBO. Continue NG to LIWS and follow clinically. Suppository. - fluid collections they do track all the way down into the deep pelvis along the psoas muscle which would be a rather atypical location for any sort of fluid collections originating from a bowel source although not impossible. Wall thickening of the ascending colon/flexure noted, favor reactive changes.  - CT cystogram shows contained leak at the anterior anastomosis - s/p IR drainage of RLQ/pelvic collection 9/27 (Cx abundant staph); 185 mL - drain creatinine consistent with urinoma/bladder leak; foley is in place per urology  - Broad-spectrum IV antibiotics as per critical care medicine  FEN: NG to LIWS and await bowel function ID: Zosyn , [id number]  VTE: SCD's, SQH Dispo: ICU   LOS: 4 days   I reviewed nursing notes, Consultant urology notes, hospitalist notes, last 24 h vitals and pain scores, last 48 h  intake and output, last 24 h labs and trends, and last 24 h imaging results.  This care required moderate level of medical decision making.   Almarie Pringle, PA-C [facility] Please see Amion for pager number during day hours 7:00am-4:30pm

## 2023-12-20 NOTE — Progress Notes (Signed)
 Admit: 12/16/2023 LOS: 4  67M with AKI after presenting with encephalopathy, A-fib with RVR/ventricular tachycardia, found to have likely abdominal urinomas with abscess.   Subjective:  Remains in ICU.  Afebrile.  Intubated.  Sedated.  On amiodarone .  On antibiotics.  No pressors. UOP 5ml K 4.5, phosphorus 3.3, sodium 131  09/29 0701 - 09/30 0700 In: 2873 [I.V.:2093.4; NG/GT:24.7; IV Piggyback:749.9] Out: 5551 [Urine:5; Emesis/NG output:1050; Drains:150]  Filed Weights   12/18/23 0124 12/19/23 0247 12/20/23 0500  Weight: 103.5 kg 102.2 kg 102.5 kg    Scheduled Meds:  amiodarone   150 mg Intravenous Once   bisacodyl   10 mg Rectal Daily   Chlorhexidine  Gluconate Cloth  6 each Topical Daily   heparin  injection (subcutaneous)  5,000 Units Subcutaneous Q8H   insulin  aspart  0-15 Units Subcutaneous Q4H   mouth rinse  15 mL Mouth Rinse Q2H   pantoprazole  (PROTONIX ) IV  40 mg Intravenous Daily   sodium chloride  flush  5 mL Intracatheter Q8H   Continuous Infusions:  amiodarone  30 mg/hr (12/20/23 0700)   ceFEPime (MAXIPIME) IV Stopped (12/19/23 2345)   fentaNYL  infusion INTRAVENOUS 300 mcg/hr (12/20/23 0700)   metronidazole  Stopped (12/19/23 2311)   norepinephrine  (LEVOPHED ) Adult infusion Stopped (12/19/23 0855)   prismasol BGK 4/2.5 400 mL/hr at 12/19/23 2006   prismasol BGK 4/2.5 400 mL/hr at 12/19/23 2008   prismasol BGK 4/2.5 1,500 mL/hr at 12/20/23 0420   propofol  (DIPRIVAN ) infusion 40 mcg/kg/min (12/20/23 0700)   PRN Meds:.fentaNYL , heparin , mouth rinse, sodium chloride   Current Labs: reviewed    Physical Exam:  Blood pressure 121/68, pulse 79, temperature 99.1 F (37.3 C), resp. rate 20, height 5' 5 (1.651 m), weight 102.5 kg, SpO2 97%. GEN: Intubated, sedated ENT: ET tube in place, NG tube in place CV: Regular, normal S1 and S2 PULM: Bilateral mechanical breath sound ABD: Distended abdomen, grimaces to palpation. EXT: no LEE  A AKI, anuric, baseline normal GFR.   No evidence of obstruction on imaging at presentation.  Suspect ATN.  Repeat bladder pressure not consistent with abdominal compartment syndrome on 9/27. Hyponatremia, progressive likely from free water  excess; correcting appropriately on CRRT.  Improving Abdominal fluid collections concerning for urinoma after radical prostatectomy 11/14/2023; status post drain placement 9/27 by IR with more than 1 L output since placement Ileus versus SBO, surgery following, ileus favored VDRF A-fib with RVR and V. tach at presentation, cardiology following on amiodarone  Septic shock on linezolid  and Zosyn , weaning off pressors Hyperphosphatemia setting of AKI, improving Metabolic acidosis, improving  P Plan to stop CRRT once it clots, given he is not requiring pressor support.  Will give IV Lasix  100 (1mg /kg) to assess renal recovery.  Will assess for HD needs daily vs CRRT Daily weights, Daily Renal Panel, Strict I/Os, Avoid nephrotoxins (NSAIDs, judicious IV Contrast)  Medication Issues; Preferred narcotic agents for pain control are hydromorphone , fentanyl , and methadone. Morphine  should not be used.  Baclofen should be avoided Avoid oral sodium phosphate  and magnesium  citrate based laxatives / bowel preps   Discussed with primary team  Dr. Evalene HERO Azalia Neuberger  12/20/2023, 7:19 AM  Recent Labs  Lab 12/19/23 0417 12/19/23 1035 12/19/23 1555 12/20/23 0414  NA 131* 132* 131* 131*  K 4.2 4.2 4.5 4.5  CL 99  --  97* 97*  CO2 21*  --  22 21*  GLUCOSE 141*  --  100* 104*  BUN 47*  --  45* 45*  CREATININE 2.99*  --  2.74* 2.61*  CALCIUM  7.7*  --  7.6* 7.7*  PHOS 3.3  --  3.4 3.3   Recent Labs  Lab 12/18/23 0336 12/19/23 0417 12/19/23 1035 12/20/23 0414  WBC 9.8 12.9*  --  13.8*  NEUTROABS  --   --   --  10.9*  HGB 10.6* 10.5* 10.5* 10.6*  HCT 29.6* 29.6* 31.0* 31.4*  MCV 76.7* 77.9*  --  80.3  PLT 260 258  --  281

## 2023-12-21 ENCOUNTER — Inpatient Hospital Stay (HOSPITAL_COMMUNITY)

## 2023-12-21 DIAGNOSIS — A419 Sepsis, unspecified organism: Secondary | ICD-10-CM | POA: Diagnosis not present

## 2023-12-21 DIAGNOSIS — N179 Acute kidney failure, unspecified: Secondary | ICD-10-CM | POA: Diagnosis not present

## 2023-12-21 DIAGNOSIS — R6521 Severe sepsis with septic shock: Secondary | ICD-10-CM | POA: Diagnosis not present

## 2023-12-21 DIAGNOSIS — I4891 Unspecified atrial fibrillation: Secondary | ICD-10-CM | POA: Diagnosis not present

## 2023-12-21 LAB — RENAL FUNCTION PANEL
Albumin: <1.5 g/dL — ABNORMAL LOW (ref 3.5–5.0)
Albumin: <1.5 g/dL — ABNORMAL LOW (ref 3.5–5.0)
Anion gap: 11 (ref 5–15)
Anion gap: 14 (ref 5–15)
BUN: 77 mg/dL — ABNORMAL HIGH (ref 6–20)
BUN: 50 mg/dL — ABNORMAL HIGH (ref 6–20)
CO2: 20 mmol/L — ABNORMAL LOW (ref 22–32)
CO2: 23 mmol/L (ref 22–32)
Calcium: 7.7 mg/dL — ABNORMAL LOW (ref 8.9–10.3)
Calcium: 7.7 mg/dL — ABNORMAL LOW (ref 8.9–10.3)
Chloride: 101 mmol/L (ref 98–111)
Chloride: 95 mmol/L — ABNORMAL LOW (ref 98–111)
Creatinine, Ser: 3.75 mg/dL — ABNORMAL HIGH (ref 0.61–1.24)
Creatinine, Ser: 2.88 mg/dL — ABNORMAL HIGH (ref 0.61–1.24)
GFR, Estimated: 18 mL/min — ABNORMAL LOW (ref >60)
GFR, Estimated: 24 mL/min — ABNORMAL LOW (ref >60)
Glucose, Bld: 132 mg/dL — ABNORMAL HIGH (ref 70–99)
Glucose, Bld: 123 mg/dL — ABNORMAL HIGH (ref 70–99)
Phosphorus: 7.3 mg/dL — ABNORMAL HIGH (ref 2.5–4.6)
Phosphorus: 4.7 mg/dL — ABNORMAL HIGH (ref 2.5–4.6)
Potassium: 5.6 mmol/L — ABNORMAL HIGH (ref 3.5–5.1)
Potassium: 4.1 mmol/L (ref 3.5–5.1)
Sodium: 132 mmol/L — ABNORMAL LOW (ref 135–145)
Sodium: 132 mmol/L — ABNORMAL LOW (ref 135–145)

## 2023-12-21 LAB — CBC WITH DIFFERENTIAL/PLATELET
Abs Immature Granulocytes: 1.6 K/uL — ABNORMAL HIGH (ref 0.00–0.07)
Basophils Absolute: 0.1 K/uL (ref 0.0–0.1)
Basophils Relative: 1 %
Eosinophils Absolute: 0.1 K/uL (ref 0.0–0.5)
Eosinophils Relative: 0 %
HCT: 32 % — ABNORMAL LOW (ref 39.0–52.0)
Hemoglobin: 10.5 g/dL — ABNORMAL LOW (ref 13.0–17.0)
Immature Granulocytes: 10 %
Lymphocytes Relative: 4 %
Lymphs Abs: 0.7 K/uL (ref 0.7–4.0)
MCH: 26.7 pg (ref 26.0–34.0)
MCHC: 32.8 g/dL (ref 30.0–36.0)
MCV: 81.4 fL (ref 80.0–100.0)
Monocytes Absolute: 0.9 K/uL (ref 0.1–1.0)
Monocytes Relative: 5 %
Neutro Abs: 13.5 K/uL — ABNORMAL HIGH (ref 1.7–7.7)
Neutrophils Relative %: 80 %
Platelets: 310 K/uL (ref 150–400)
RBC: 3.93 MIL/uL — ABNORMAL LOW (ref 4.22–5.81)
RDW: 15.8 % — ABNORMAL HIGH (ref 11.5–15.5)
Smear Review: NORMAL
WBC: 16.9 K/uL — ABNORMAL HIGH (ref 4.0–10.5)
nRBC: 0.1 % (ref 0.0–0.2)

## 2023-12-21 LAB — MAGNESIUM: Magnesium: 2.8 mg/dL — ABNORMAL HIGH (ref 1.7–2.4)

## 2023-12-21 LAB — GLUCOSE, CAPILLARY
Glucose-Capillary: 124 mg/dL — ABNORMAL HIGH (ref 70–99)
Glucose-Capillary: 124 mg/dL — ABNORMAL HIGH (ref 70–99)
Glucose-Capillary: 127 mg/dL — ABNORMAL HIGH (ref 70–99)
Glucose-Capillary: 111 mg/dL — ABNORMAL HIGH (ref 70–99)
Glucose-Capillary: 129 mg/dL — ABNORMAL HIGH (ref 70–99)
Glucose-Capillary: 112 mg/dL — ABNORMAL HIGH (ref 70–99)

## 2023-12-21 LAB — CULTURE, BLOOD (ROUTINE X 2)
Culture: NO GROWTH
Special Requests: ADEQUATE

## 2023-12-21 LAB — HEPATIC FUNCTION PANEL
ALT: 22 U/L (ref 0–44)
AST: 38 U/L (ref 15–41)
Albumin: <1.5 g/dL — ABNORMAL LOW (ref 3.5–5.0)
Alkaline Phosphatase: 112 U/L (ref 38–126)
Bilirubin, Direct: 0.4 mg/dL — ABNORMAL HIGH (ref 0.0–0.2)
Indirect Bilirubin: 0.9 mg/dL (ref 0.3–0.9)
Total Bilirubin: 1.3 mg/dL — ABNORMAL HIGH (ref 0.0–1.2)
Total Protein: 6.1 g/dL — ABNORMAL LOW (ref 6.5–8.1)

## 2023-12-21 LAB — STREP PNEUMONIAE URINARY ANTIGEN: Strep Pneumo Urinary Antigen: NEGATIVE

## 2023-12-21 LAB — HEPATITIS B SURFACE ANTIGEN: Hepatitis B Surface Ag: NONREACTIVE

## 2023-12-21 LAB — MRSA NEXT GEN BY PCR, NASAL: MRSA by PCR Next Gen: NOT DETECTED

## 2023-12-21 MED ORDER — LIDOCAINE-PRILOCAINE 2.5-2.5 % EX CREA: 1.0000 | TOPICAL_CREAM | CUTANEOUS | Status: DC | PRN

## 2023-12-21 MED ORDER — HEPARIN SODIUM (PORCINE) 1000 UNIT/ML IJ SOLN
INTRAMUSCULAR | Status: AC
  Filled 2023-12-21: qty 4

## 2023-12-21 MED ORDER — CHLORHEXIDINE GLUCONATE CLOTH 2 % EX PADS
6.0000 | MEDICATED_PAD | Freq: Every day | CUTANEOUS | Status: DC
Start: 2023-12-21 — End: 2023-12-23
  Administered 2023-12-20 – 2023-12-22 (×2): 6 via TOPICAL

## 2023-12-21 MED ORDER — HEPARIN SODIUM (PORCINE) 1000 UNIT/ML IJ SOLN
1000.0000 [IU] | INTRAMUSCULAR | Status: DC | PRN
  Administered 2023-12-21 – 2023-12-22 (×2): 2400 [IU]

## 2023-12-21 MED ORDER — IOHEXOL 350 MG/ML SOLN
75.0000 mL | Freq: Once | INTRAVENOUS | Status: AC | PRN
  Administered 2023-12-21: 75 mL via INTRAVENOUS

## 2023-12-21 MED ORDER — IOHEXOL 9 MG/ML PO SOLN
500.0000 mL | ORAL | Status: AC
  Administered 2023-12-21: 500 mL

## 2023-12-21 MED ORDER — DIATRIZOATE MEGLUMINE & SODIUM 66-10 % PO SOLN
90.0000 mL | Freq: Once | ORAL | Status: DC
  Filled 2023-12-21: qty 90

## 2023-12-21 MED ORDER — ACETAMINOPHEN 10 MG/ML IV SOLN
1000.0000 mg | Freq: Four times a day (QID) | INTRAVENOUS | Status: DC | PRN
Start: 2023-12-21 — End: 2023-12-22
  Administered 2023-12-21: 1000 mg via INTRAVENOUS
  Filled 2023-12-21: qty 100

## 2023-12-21 MED ORDER — HEPARIN SODIUM (PORCINE) 1000 UNIT/ML IJ SOLN
INTRAMUSCULAR | Status: AC
Start: 2023-12-21 — End: 2023-12-21
  Filled 2023-12-21: qty 4

## 2023-12-21 MED ORDER — SODIUM CHLORIDE 0.9 % IV SOLN
1.0000 g | INTRAVENOUS | Status: DC
  Administered 2023-12-21 – 2023-12-27 (×6): 1 g via INTRAVENOUS
  Filled 2023-12-21 (×9): qty 20

## 2023-12-21 MED ORDER — ALTEPLASE 2 MG IJ SOLR: 2.0000 mg | Freq: Once | INTRAMUSCULAR | Status: DC | PRN

## 2023-12-21 MED ORDER — PENTAFLUOROPROP-TETRAFLUOROETH EX AERO: 1.0000 | INHALATION_SPRAY | CUTANEOUS | Status: DC | PRN

## 2023-12-21 MED ORDER — LIDOCAINE HCL (PF) 1 % IJ SOLN: 5.0000 mL | INTRAMUSCULAR | Status: DC | PRN

## 2023-12-21 MED ORDER — ACETAMINOPHEN 650 MG RE SUPP: 650.0000 mg | Freq: Four times a day (QID) | RECTAL | Status: DC | PRN

## 2023-12-21 MED ORDER — IOHEXOL 9 MG/ML PO SOLN: 500.0000 mL | ORAL | Status: DC

## 2023-12-21 NOTE — Progress Notes (Signed)
 NAME:  Glenn Stewart, MRN:  [id number], DOB:  1966/01/22, LOS: 5 ADMISSION DATE:  12/16/2023, CONSULTATION DATE:  12/16/23 REFERRING MD:  Dr. Cottie, CHIEF COMPLAINT:  weakness   bRIEF  HPI obtained from EMR as pt is intubated and sedated.   44 yoM with PMH of HTN, CAD, CABG x 5 04/2022 with post op Afib since stopped on Eliquis  and amiodarone  09/2022, HTN, HLD, GERD, NASH, bipolar/ ADHD and s/p recent robotic assisted laparoscopic radial prostatectomy on 11/17/23 who presented for weakness and waxing mental status with distended and tender abdominal by EMS found with episodes of Afib with RVR and VT.  Was hemodynamically unstable on arrival, underwent emergent cardioversion and given amiodarone  x2 and magnesium , with emerging rhythm of afib with RVR possible flutter.  Cardiology consulted. Post cardioversion became hypotensive with respiratory distress therefore intubated and placed on norepinephrine .  Difficulty oxygenating post intubation with normal PIP requiring 1.0 and 12 PEEP.  Labs noted for K2.8, Na 129, bicarb 16, BUN/ sCr 55/ 3.92 (recently  ), t. Bili 3.4, AST 68, albumin  1.6, protein 3.4, INR 1.2.  CTH neg, CTA PE and CT a/p w/contrast pending official read.  2.1L of brownish OGT contents s/p OGT placement. Cultures sent, empiric zosyn / vanc started.  Afebrile, anuric and since weaned off pressors.  R internal jugular CVL placed by EDP.  PCCM consulted for admission.     NOTE:LD him as outpatient, patient self-irrigated catheter a couple days after initial surgery and then removed catheter himself prior to follow-up at 1 week. Patient reported that he cut tubing prior to removal but unable to confirm if removal was truly atraumat   Pertinent  Medical History    has a past medical history of ADHD (attention deficit hyperactivity disorder), Anxiety state, unspecified, Bipolar 1 disorder (HCC), Cancer (HCC), CHEST PAIN (12/04/2008), Coronary artery disease, Depressive disorder, not elsewhere  classified, Dermatophytosis of nail, Esophageal reflux, History of kidney stones, HYPERGLYCEMIA (07/10/2007), Low back pain (09/18/2013), Other chronic nonalcoholic liver disease, Pre-diabetes, Problems with hearing, Prostatitis, unspecified, Pure hypercholesterolemia, Screen for STD (sexually transmitted disease) (04/20/2011), Sprain of cruciate ligament of knee, Tear of medial cartilage or meniscus of knee, current, TMJ (temporomandibular joint disorder) (12/25/2010), Unspecified essential hypertension, Unspecified gastritis and gastroduodenitis without mention of hemorrhage, and Urethritis, unspecified.   has a past surgical history that includes No prior surgery; LEFT HEART CATH AND CORONARY ANGIOGRAPHY (N/A, 05/10/2022); Coronary artery bypass graft (N/A, 05/12/2022); Radial artery harvest (Left, 05/12/2022); TEE without cardioversion (N/A, 05/12/2022); Cystoscopy w/ litholapaxy / EHL; and Robot assisted laparoscopic radical prostatectomy (N/A, 11/17/2023).   Significant Hospital Events: Including procedures, antibiotic start and stop dates in addition to other pertinent events   9/26> admit 9/27: 90% fio2 on ventilator. On diprivan  gtt, fent gtt, amio gtt. CVP 9.  AFebrile. Cutlure negative so far. cReat 5.13 and climbing (Baseline 0.91 on 8/292/25). AKI started 12/16/23. Normal lactate byu Ionized calcim is low. Seen by urology  & CCS- recommending  IR attemt of intrabdominal abscess that is in RLQ and tracks down bladder..  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 CCS does not think there is appendicit or diveritculits. And that SBO/Colitis is secondary to urinoma. Off levop and followed commands. aNURIC - 100cc only since admit 9/27 CT IMPRESSION: . There is focal irregularity at the junction of the bladder base and urethra with small anterior contrast pooling suggesting contained perforation. No intraperitoneal contrast extravasation. Appearance examination is otherwise unchanged. Again  demonstrated re multiple loculated fluid collections  in the abdomen and pelvis onsistent with abscesses. Wall thickening of the cecum and terminal ileum. Fluid-filled dilated small bowel appear mildly improved.  Consolidation or atelectasis in both lung bases, possibly pneumonia, compressive atelectasis, or aspiration. Small bilateral pleural effusions  . Wall thickening of the ascending colon and hepatic flexure, new since prior study. This may indicate colitis or inflammatory bowel disease versus reactive change. Dilated fluid-filled small bowel with air-fluid levels and mild diffuse wall thickening. Terminal ileum is decompressed. This is consistent with small-bowel obstruction, possibly due to inflammatory strictures. IR Procedure: DRAINAGE CATHETER PLACEMENT into a RIGHT LOWER QUADRANT FLUID COLLECTION ->> 1L drained HD CATH Placed CRRT started 9/28 - On vent. Ongoing CRRT. Seen by cCS  - still concernd about Urinoma and secondary blowe irritation and not a true obstruction NGT output is low c/w ileus. CCS does  NOT want enteral medication. Recommnding max only trophic feds. CUlture negative. Afebrile.  Stopped on Zyvox  and Zosyn  9/29 SBO Xr protocol per [facility] with oral contrast pooling in the gastric fundus. MSSA in abdominal cultures and Enterobacter cloacae in Ucx - on Cefepime and Flagyl  SBT trial daiiled secondary to RR >40 and increased WOB. Back on PRVC   Interim History / Subjective:  Fevered overnight. Increased agitation. Failed SBT with increased RR. CRRT stopped. Lasix  challege without much urine output. Awake this AM. Nodding for abdominal pain.  Objective    Blood pressure 130/80, pulse 72, temperature 100.2 F (37.9 C), resp. rate (!) 21, height 5' 5 (1.651 m), weight 97.8 kg, SpO2 95%. CVP:  [8 mmHg-14 mmHg] 12 mmHg  Vent Mode: PRVC FiO2 (%):  [40 %] 40 % Set Rate:  [20 bmp] 20 bmp Vt Set:  [490 mL] 490 mL PEEP:  [5 cmH20] 5 cmH20 Pressure Support:  [0 cmH20-8  cmH20] 8 cmH20 Plateau Pressure:  [11 cmH20-18 cmH20] 18 cmH20   Intake/Output Summary (Last 24 hours) at 12/21/2023 0736 Last data filed at 12/21/2023 9364 Gross per 24 hour  Intake 1713.1 ml  Output 2827 ml  Net -1113.9 ml  4L removed on CRRT 150 ml from drain 1L from OG tube prior to clamping Filed Weights   12/19/23 0247 12/20/23 0500 12/21/23 0458  Weight: 102.2 kg 102.5 kg 97.8 kg    Examination:  General Appearance: ill appearing, mildly jaundiced Eyes:  jaundice sclera Throat:  ETT TUBE - yes , OG tube - yes Lungs: clear to auscultation. Minimal secretions to suction Heart:  RRR; reviewed telemetry Abdomen:  distended +BS, tender to palpation; about similar from yesterday Extremities: non edematous Skin:  dry and warm Neurologic: opens eye, sticks tongue out, squeezes hands bilaterally and wiggles toes   Labs reviewed: Leukocytosis: 13.8 ->16.9. Neut 10.9 >13.5 K 5.6  Cr 2.63>3.75  Assessment and Plan   Septic shock, resolved Prostate cancer s/p robotic assisted laparoscopic radial prostatectomy on 11/17/23 S/p IR drainage on 9/27 Enterobacter cloacae in Urine Cx Abdominal abscess with MSSA Uptrending leukocytosis and fever overnight - Stopped on Zyvox  (9/27 - - Stopped Zosyn  (9/26 - - Cefepime and flagyl  9/29 - 9/30 - Start on Meropenem today - Follow up 10/1 Bcx, tracheal aspirate, and repeat MRSA swab - Broaden GRN coverage to ertapenem today - CT A/P with IV and oral contrast and time it with potential HD  Ileus vs pSBO intraabdominal fluid collections and abscess at anterior anastomosis Distal colon enterocolitis In pain, distended, with +BS today. 9/29 SBO study favoring ileus over obstruction but can't rule out pSBO -  NG to LIWS - CT AP with oral contrast today to rule out IV - Continue on GNR and anaerobic coverage - Pain control with Ketamine  and PRN dilaudid   Acute hypoxic and hypercarbic respiratory failure Aspiration of BLL PNA CTA PE neg  for acute PE. Blood gas reviewed this AM - Abx for possible pna coverage 9/26 - - PRVC with VAP bundle - Reassess daily; SBT today as tolerated - Strep neg; legionella pending - broadening coverage  Afib VT, monomorphic Hx CAD, prior CABG x 5 04/2022, and post op Afib (off amio/ eliquis  09/2022), HTN, HLD - s/p emergent cardioversion during this admission - Will continue on amiodarone  drip; PO when able - Admission echo ef 55% without wall motion abnormalities - appreciate cardiology recommendations  AKI since 9/27 post surgery 10/2023 Prostatectomy with complicated post op course Per Dr. Cam, urologist, patient was self irrigating bladder through foley and removed the foley cath 4 days ahead of schedule Continues to be anuric on 100 mg furosemide  once 9/30 - Discontinued CRRT on 9/30 - HD later today - Foley to remain in place; repeat cystogram ~10/7-10 per urology  Hyperkalemia Likely in setting on AKI and discontinuation of CRRT 5.6 this AM - EKG; no changes - HD today  Acute on chronic anemia  Critical illness 10.5 today. stable - Continue to monitor - Transfuse for Hgb <8  Elevated AST - stable Elevated T bili - improving Indirect bilirubin; normal Direct 1.3>0.8 today Follow up CT A/P  Neuro/Encephalopathy  ?concern for bipolar disorder; see Dr. Monte note from 9/30 - On sedation  Best practice:  Diet: Will need TPN given ileus; will discuss after CT A/p Pain/Anxiety/Delirium protocol (if indicated): Diprivan  gtt and Fent gtt VAP protocol (if indicated): YES DVT prophylaxis: heparin  5000 tid 12/17/23 GI prophylaxis: PPI Glucose control: ssi Mobility: bed rest Code Status: full code DISPOSITION: ICU  Family Communication:   - Called Mom Rock Spanner [phone] - she said she is 25 and deferred that I called daughter but did inform her that he is on life support  - Called Sister Lucian Pond [phone]- called and  updated 9:22 AM 12/17/2023  (she is in [place] and planning to come). She 0  - 12/18/23:   Called  sister Lucian Pond - and updated  - 9/29: attempted without success.  - 9/30: discussed with sister Tena  - 10/1 -  RN discussed with sister Randine ENGLAND   Hadassah Kristy Ahr, MD [facility] Internal Medicine Program - PGY-3 12/21/2023, 7:36 AM  LABS    PULMONARY Recent Labs  Lab 12/16/23 2124 12/17/23 0422 12/17/23 0908 12/17/23 1002 12/17/23 1037 12/19/23 1035  PHART 7.301* 7.292* 7.346*  --  7.349* 7.383  PCO2ART 41.3 42.5 36.8  --  34.2 40.3  PO2ART 102 95 116*  --  91 78*  HCO3 20.4 20.6 20.1  --  18.8* 24.0  TCO2 22 22 21*  --  20* 25  O2SAT 97 97 98 77.7 97 95    CBC Recent Labs  Lab 12/19/23 0417 12/19/23 1035 12/20/23 0414 12/21/23 0432  HGB 10.5* 10.5* 10.6* 10.5*  HCT 29.6* 31.0* 31.4* 32.0*  WBC 12.9*  --  13.8* 16.9*  PLT 258  --  281 310    COAGULATION Recent Labs  Lab 12/16/23 1524  INR 1.2    CARDIAC  No results for input(s): TROPONINI in the last 168 hours. No results for input(s): PROBNP in the last  168 hours.   CHEMISTRY Recent Labs  Lab 12/17/23 0333 12/17/23 0422 12/18/23 0336 12/18/23 1615 12/19/23 0417 12/19/23 1035 12/19/23 1555 12/20/23 0414 12/20/23 0848 12/20/23 1657 12/21/23 0432  NA 125*   < > 127*  126*   < > 131*   < > 131* 131* 131* 135 132*  K 4.3   < > 4.1  4.1   < > 4.2   < > 4.5 4.5 4.6 5.2* 5.6*  CL 87*   < > 93*  91*   < > 99  --  97* 97* 98 98 101  CO2 17*   < > 20*  20*   < > 21*  --  22 21* 23 22 20*  GLUCOSE 121*   < > 130*  129*   < > 141*  --  100* 104* 109* 120* 132*  BUN 74*   < > 68*  68*   < > 47*  --  45* 45* 45* 48* 77*  CREATININE 5.13*   < > 4.55*  4.39*   < > 2.99*  --  2.74* 2.61* 2.67* 2.63* 3.75*  CALCIUM  7.0*   < > 7.0*  7.0*   < > 7.7*  --  7.6* 7.7* 7.8* 7.8* 7.7*  MG 2.9*  --  2.8*  --  3.0*  --   --  2.8*  --   --  2.8*  PHOS 11.7*   < > 6.1*   < > 3.3  --  3.4 3.3 3.5 4.9* 7.3*   < > =  values in this interval not displayed.   Estimated Creatinine Clearance: 22.8 mL/min (A) (by C-G formula based on SCr of 3.75 mg/dL (H)).   LIVER Recent Labs  Lab 12/16/23 1524 12/17/23 0333 12/17/23 1804 12/18/23 0336 12/18/23 1615 12/19/23 0416 12/19/23 0417 12/19/23 1555 12/20/23 0414 12/20/23 0848 12/20/23 1657 12/21/23 0432  AST 68* 83*  --  85*  --  62*  --   --  44*  --   --   --   ALT 37 43  --  41  --  34  --   --  27  --   --   --   ALKPHOS 69 85  --  105  --  116  --   --  122  --   --   --   BILITOT 3.4* 3.9*  --  3.1*  --  2.3*  --   --  1.7*  --   --   --   PROT 5.2* 5.6*  --  5.4*  --  5.6*  --   --  6.2*  --   --   --   ALBUMIN  1.6* 1.5*   < > <1.5*  <1.5*   < > <1.5*   < > <1.5* <1.5*  <1.5* <1.5* <1.5* <1.5*  INR 1.2  --   --   --   --   --   --   --   --   --   --   --    < > = values in this interval not displayed.     INFECTIOUS Recent Labs  Lab 12/16/23 1600 12/16/23 1839 12/16/23 2023  LATICACIDVEN 3.5* 1.2 1.3     ENDOCRINE CBG (last 3)  Recent Labs    12/20/23 2318 12/21/23 0328 12/21/23 0720  GLUCAP 119* 124* 124*     IMAGING x48h  - image(s) personally visualized  -   highlighted in  bold DG Abd Portable 1V Result Date: 12/20/2023 CLINICAL DATA:  Small-bowel obstruction. EXAM: PORTABLE ABDOMEN - 1 VIEW COMPARISON:  12/19/2023 FINDINGS: Pigtail catheter again seen in the right lower quadrant. Nasogastric tube is seen with tip in the gastric body. Moderate to markedly dilated small bowel loops are again seen in the central and left abdomen, without significant change since previous study. No significant colonic gas or stool seen. These findings remain consistent with a small-bowel obstruction. IMPRESSION: No apparent change in small-bowel obstruction since prior study. Nasogastric tube tip in gastric body. Electronically Signed   By: Norleen DELENA Kil M.D.   On: 12/20/2023 09:15   DG Abd Portable 1V-Small Bowel Obstruction Protocol-initial,  8 hr delay Result Date: 12/19/2023 CLINICAL DATA:  8 hour delay radiograph. EXAM: PORTABLE ABDOMEN - 1 VIEW COMPARISON:  CT abdomen pelvis dated 12/16/2023. FINDINGS: Evaluation is limited due to body habitus and patient's positioning. Oral contrast noted pooling in the gastric fundus. No definite contrast noted in the small bowel or colon. Dilated air-filled loops of small bowel measure up to 5.5 cm in caliber. Continued follow-up recommended. A pigtail drainage catheter noted in the right lower quadrant. IMPRESSION: 1. Oral contrast pooling in the gastric fundus. No definite contrast noted in the small bowel or colon. 2. Dilated air-filled loops of small bowel. Electronically Signed   By: Vanetta Chou M.D.   On: 12/19/2023 20:09

## 2023-12-21 NOTE — Consult Note (Addendum)
 WOC Nurse Consult Note: Reason for Consult: dtpi sacrum  Wound type: Deep Tissue Pressure Injury Sacrum/buttocks  Pressure Injury POA: no  Measurement: see nursing flowsheet  Wound bed: purple maroon discoloration  Drainage (amount, consistency, odor) dry Periwound: Dressing procedure/placement/frequency: cleanse sacrum/buttocks with soap and water , dry and apply Xeroform gauze (Lawson [phone]) to wound bed daily and secure with silicone foam.   Patient should remain on a low air loss mattress when moved out of the ICU setting for pressure redistribution.   POC discussed with bedside nurse. WOC team will follow every 7 to 10 days to assess area and change POC as needed.   Thank you,    Powell Bar MSN, RN-BC, [employer]

## 2023-12-21 NOTE — Progress Notes (Signed)
 3:15 PM :   Discussed CT A/P with Gen surgery, PA Simman: We will continue to hold off TF. KUB to follow in the AM to assess contrast progression since there is still dilatation of the colon. Will also hold off on starting prokinetic agents.

## 2023-12-21 NOTE — Progress Notes (Signed)
 [facility] Progress Note     Subjective: CC:  Intubated, sedated.   NGT - 700 mL/24h, No BM  WBC 16.9 from 13.8  On Zosyn   Objective: Vital signs in last 24 hours: Temp:  [99.1 F (37.3 C)-101.1 F (38.4 C)] 100.2 F (37.9 C) (10/01 0630) Pulse Rate:  [69-94] 77 (10/01 0840) Resp:  [18-32] 26 (10/01 0840) BP: (89-175)/(58-102) 130/80 (10/01 0630) SpO2:  [91 %-98 %] 95 % (10/01 0840) FiO2 (%):  [40 %] 40 % (10/01 0840) Weight:  [97.8 kg] 97.8 kg (10/01 0458) Last BM Date : 12/16/23  Intake/Output from previous day: 09/30 0701 - 10/01 0700 In: 1713.1 [I.V.:1253; IV Piggyback:460.1] Out: 2827 [Urine:95; Emesis/NG output:700; Drains:300] Intake/Output this shift: Total I/O In: -  Out: 218 [Urine:18; Emesis/NG output:200]  PE: Gen:  acutely ill appearing Card:  Regular rate and rhythm Pulm:  ventilated respirations Abd: soft, moderate distention - stable, facial grimace to abdominal palpation, no rebound tenderness, drain to gravity appears serous Skin: warm and dry, no rashes Psych: unable to assess  Lab Results:  Recent Labs    12/20/23 0414 12/21/23 0432  WBC 13.8* 16.9*  HGB 10.6* 10.5*  HCT 31.4* 32.0*  PLT 281 310   BMET Recent Labs    12/20/23 1657 12/21/23 0432  NA 135 132*  K 5.2* 5.6*  CL 98 101  CO2 22 20*  GLUCOSE 120* 132*  BUN 48* 77*  CREATININE 2.63* 3.75*  CALCIUM  7.8* 7.7*   PT/INR No results for input(s): LABPROT, INR in the last 72 hours.  CMP     Component Value Date/Time   NA 132 (L) 12/21/2023 0432   NA 141 06/08/2022 1108   K 5.6 (H) 12/21/2023 0432   CL 101 12/21/2023 0432   CO2 20 (L) 12/21/2023 0432   GLUCOSE 132 (H) 12/21/2023 0432   BUN 77 (H) 12/21/2023 0432   BUN 16 06/08/2022 1108   CREATININE 3.75 (H) 12/21/2023 0432   CALCIUM  7.7 (L) 12/21/2023 0432   PROT 6.2 (L) 12/20/2023 0414   ALBUMIN  <1.5 (L) 12/21/2023 0432   AST 44 (H) 12/20/2023 0414   ALT 27 12/20/2023 0414   ALKPHOS 122  12/20/2023 0414   BILITOT 1.7 (H) 12/20/2023 0414   GFRNONAA 18 (L) 12/21/2023 0432   GFRAA >90 01/07/2014 0245   Lipase     Component Value Date/Time   LIPASE 31 07/16/2023 1753       Studies/Results: DG Abd Portable 1V Result Date: 12/20/2023 CLINICAL DATA:  Small-bowel obstruction. EXAM: PORTABLE ABDOMEN - 1 VIEW COMPARISON:  12/19/2023 FINDINGS: Pigtail catheter again seen in the right lower quadrant. Nasogastric tube is seen with tip in the gastric body. Moderate to markedly dilated small bowel loops are again seen in the central and left abdomen, without significant change since previous study. No significant colonic gas or stool seen. These findings remain consistent with a small-bowel obstruction. IMPRESSION: No apparent change in small-bowel obstruction since prior study. Nasogastric tube tip in gastric body. Electronically Signed   By: Norleen DELENA Kil M.D.   On: 12/20/2023 09:15   DG Abd Portable 1V-Small Bowel Obstruction Protocol-initial, 8 hr delay Result Date: 12/19/2023 CLINICAL DATA:  8 hour delay radiograph. EXAM: PORTABLE ABDOMEN - 1 VIEW COMPARISON:  CT abdomen pelvis dated 12/16/2023. FINDINGS: Evaluation is limited due to body habitus and patient's positioning. Oral contrast noted pooling in the gastric fundus. No definite contrast noted in the small bowel or colon. Dilated air-filled loops of small bowel  measure up to 5.5 cm in caliber. Continued follow-up recommended. A pigtail drainage catheter noted in the right lower quadrant. IMPRESSION: 1. Oral contrast pooling in the gastric fundus. No definite contrast noted in the small bowel or colon. 2. Dilated air-filled loops of small bowel. Electronically Signed   By: Vanetta Chou M.D.   On: 12/19/2023 20:09    Anti-infectives: Anti-infectives (From admission, onward)    Start     Dose/Rate Route Frequency Ordered Stop   12/21/23 1600  meropenem (MERREM) 1 g in sodium chloride  0.9 % 100 mL IVPB        1 g 200 mL/hr  over 30 Minutes Intravenous Every 24 hours 12/21/23 0853     12/19/23 1045  ceFEPIme (MAXIPIME) 2 g in sodium chloride  0.9 % 100 mL IVPB  Status:  Discontinued        2 g 200 mL/hr over 30 Minutes Intravenous Every 12 hours 12/19/23 0955 12/21/23 0843   12/19/23 1045  metroNIDAZOLE  (FLAGYL ) IVPB 500 mg  Status:  Discontinued        500 mg 100 mL/hr over 60 Minutes Intravenous Every 12 hours 12/19/23 0955 12/21/23 0843   12/17/23 1600  piperacillin -tazobactam (ZOSYN ) IVPB 2.25 g  Status:  Discontinued        2.25 g 100 mL/hr over 30 Minutes Intravenous Every 8 hours 12/17/23 0931 12/17/23 1144   12/17/23 1600  piperacillin -tazobactam (ZOSYN ) IVPB 3.375 g  Status:  Discontinued        3.375 g 100 mL/hr over 30 Minutes Intravenous Every 6 hours 12/17/23 1157 12/19/23 0955   12/17/23 1230  piperacillin -tazobactam (ZOSYN ) IVPB 3.375 g  Status:  Discontinued        3.375 g 100 mL/hr over 30 Minutes Intravenous Every 6 hours 12/17/23 1144 12/17/23 1157   12/17/23 1000  linezolid  (ZYVOX ) IVPB 600 mg  Status:  Discontinued        600 mg 300 mL/hr over 60 Minutes Intravenous Every 12 hours 12/16/23 1924 12/19/23 0958   12/17/23 0000  piperacillin -tazobactam (ZOSYN ) IVPB 3.375 g  Status:  Discontinued        3.375 g 12.5 mL/hr over 240 Minutes Intravenous Every 8 hours 12/16/23 1924 12/17/23 0931   12/16/23 1600  piperacillin -tazobactam (ZOSYN ) IVPB 3.375 g        3.375 g 12.5 mL/hr over 240 Minutes Intravenous  Once 12/16/23 1551 12/16/23 1602   12/16/23 1600  vancomycin  (VANCOREADY) IVPB 1750 mg/350 mL        1,750 mg 175 mL/hr over 120 Minutes Intravenous  Once 12/16/23 1551 12/16/23 1816        Assessment/Plan  Glenn Stewart is an 58 y.o. male with CAD (s/p CABG), afib, HTN, HLD, arrhythmias here following radical prostatectomy and lymph node dissection 11/17/2023 for prostate cancer - Does have reactive ileus vs pSBO in the setting of intra-abdominal fluid collections. Remains  distended on exam without significant bowel function; I was planning to repeat the SBO protocol with gastrografin but primary team has ordered a CT scan for leukocytosis/low grade fever workup so I have added on gastrografin. Due to a miscommunication the patient received 500 mL PO contrast instead of 90 mL gastrografin. We will follow the results of the CT and continue to make recommendatons. Continue OG to LIWS. If he weans to extubate please place an NG tube to LIWS.  - fluid collections they do track all the way down into the deep pelvis along the psoas muscle which would be a  rather atypical location for any sort of fluid collections originating from a bowel source although not impossible. Wall thickening of the ascending colon/flexure noted, favor reactive changes.  - CT cystogram shows contained leak at the anterior anastomosis; read Dr. Hosea note from yesterday 9/30; continue foley 7-10 days.  - s/p IR drainage of RLQ/pelvic collection 9/27 (Cx abundant staph); 300 mL - drain creatinine consistent with urinoma/bladder leak; foley is in place per urology  - Broad-spectrum IV antibiotics as per critical care medicine  FEN: NG to LIWS and await bowel function ID: Zosyn , zyvox  VTE: SCD's, SQH Dispo: ICU   LOS: 5 days   I reviewed nursing notes, Consultant urology notes, hospitalist notes, last 24 h vitals and pain scores, last 48 h intake and output, last 24 h labs and trends, and last 24 h imaging results.  This care required moderate level of medical decision making.   Almarie Pringle, PA-C [facility] Please see Amion for pager number during day hours 7:00am-4:30pm

## 2023-12-21 NOTE — Progress Notes (Signed)
 Pt transported to CT and back to 2M04 via ventilator with no complications.

## 2023-12-21 NOTE — Progress Notes (Signed)
 Attempted sputum culture x2 with no success. Will attempt later.

## 2023-12-21 NOTE — Progress Notes (Deleted)
  Progress Note   Date: 12/20/2023  Patient Name: Glenn Stewart        MRN#: [id number]   Clarification of diagnosis:  Acute metabolic encephalopathy due to CVA.

## 2023-12-21 NOTE — Progress Notes (Signed)
 Admit: 12/16/2023 LOS: 5  77M with AKI after presenting with encephalopathy, A-fib with RVR/ventricular tachycardia, found to have likely abdominal urinomas with abscess.   Subjective:  Remains in ICU.  Febrile overnight, Tmax 101.59F.  Intubated.  Sedated.  On amiodarone .  On antibiotics.  No pressors. CRRT stopped yesterday.  Received IV Lasix  100 x 1. UOP 95ml K 4.6->5.6, phosphorus 3.3->7.3, sodium 131->132, Cr 2.61->3.75. Primary concern for worsening fever and leukocytosis, plan for CT today. Plan HD after CT Remains intubated.   09/30 0701 - 10/01 0700 In: 1713.1 [I.V.:1253; IV Piggyback:460.1] Out: 2827 [Urine:95; Emesis/NG output:700; Drains:300]  Filed Weights   12/19/23 0247 12/20/23 0500 12/21/23 0458  Weight: 102.2 kg 102.5 kg 97.8 kg    Scheduled Meds:  amiodarone   150 mg Intravenous Once   bisacodyl   10 mg Rectal Daily   Chlorhexidine  Gluconate Cloth  6 each Topical Daily   heparin  injection (subcutaneous)  5,000 Units Subcutaneous Q8H   mouth rinse  15 mL Mouth Rinse Q2H   pantoprazole  (PROTONIX ) IV  40 mg Intravenous Daily   sodium chloride  flush  5 mL Intracatheter Q8H   Continuous Infusions:  amiodarone  30 mg/hr (12/21/23 0600)   ceFEPime (MAXIPIME) IV Stopped (12/21/23 0003)   dexmedetomidine  (PRECEDEX ) IV infusion 1.2 mcg/kg/hr (12/21/23 0600)   fentaNYL  infusion INTRAVENOUS Stopped (12/20/23 1415)   ketamine  (KETALAR ) adult infusion 0.5 mg/kg/hr (12/21/23 0600)   metronidazole  Stopped (12/20/23 2327)   prismasol BGK 4/2.5 400 mL/hr at 12/20/23 1327   prismasol BGK 4/2.5 400 mL/hr at 12/20/23 1328   prismasol BGK 4/2.5 1,500 mL/hr at 12/20/23 1415   PRN Meds:.heparin , HYDROmorphone  (DILAUDID ) injection, HYDROmorphone  (DILAUDID ) injection, mouth rinse, sodium chloride   Current Labs: reviewed    Physical Exam:  Blood pressure 130/80, pulse 72, temperature 100.2 F (37.9 C), resp. rate (!) 21, height 5' 5 (1.651 m), weight 97.8 kg, SpO2 95%. GEN:  Intubated, sedated ENT: ET tube in place, NG tube in place CV: Regular, normal S1 and S2 PULM: Bilateral mechanical breath sound ABD: Distended abdomen, grimaces to palpation. EXT: no LEE  A AKI, anuric, baseline normal GFR.  No evidence of obstruction on imaging at presentation.  Suspect ATN.  Repeat bladder pressure not consistent with abdominal compartment syndrome on 9/27. 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 tach at presentation, cardiology following on amiodarone  Septic shock on linezolid  and Zosyn , Off pressors Hyperphosphatemia setting of AKI, worsening.  Continue RRT Metabolic acidosis, stable Recurrent fevers  P Stopped CRRT 9/30. Failed Lasix  stress test.  Plan HD today, 10/1 Daily weights, Daily Renal Panel, Strict I/Os, Avoid nephrotoxins (NSAIDs, judicious IV Contrast)  Medication Issues; Preferred narcotic agents for pain control are hydromorphone , fentanyl , and methadone. Morphine  should not be used.  Baclofen should be avoided Avoid oral sodium phosphate  and magnesium  citrate based laxatives / bowel preps   Discussed with primary team  Dr. Evalene HERO Chakara Bognar  12/21/2023, 7:30 AM  Recent Labs  Lab 12/20/23 0848 12/20/23 1657 12/21/23 0432  NA 131* 135 132*  K 4.6 5.2* 5.6*  CL 98 98 101  CO2 23 22 20*  GLUCOSE 109* 120* 132*  BUN 45* 48* 77*  CREATININE 2.67* 2.63* 3.75*  CALCIUM  7.8* 7.8* 7.7*  PHOS 3.5 4.9* 7.3*   Recent Labs  Lab 12/19/23 0417 12/19/23  1035 12/20/23 0414 12/21/23 0432  WBC 12.9*  --  13.8* 16.9*  NEUTROABS  --   --  10.9* 13.5*  HGB 10.5* 10.5* 10.6* 10.5*  HCT 29.6* 31.0* 31.4* 32.0*  MCV 77.9*  --  80.3 81.4  PLT 258  --  281 310

## 2023-12-21 NOTE — Progress Notes (Signed)
   12/21/23 1801  Vitals  Temp 99.5 F (37.5 C)  Pulse Rate 74  Resp (!) 22  BP 118/70  SpO2 100 %  O2 Device Ventilator  Weight 96.1 kg  Type of Weight Post-Dialysis  Oxygen Therapy  FiO2 (%) 40 %  Patient Activity (if Appropriate) In bed  Pulse Oximetry Type Continuous  Oximetry Probe Site Changed No  Post Treatment  Dialyzer Clearance Clear  Hemodialysis Intake (mL) 0 mL  Liters Processed 84  Fluid Removed (mL) 1700 mL  Tolerated HD Treatment Yes   Pt tx done at bedside---69m04  Intubated and sedated Informed consent signed and in chart.   TX duration:3.5  Patient tolerated well.   No acute distress.  Hand-off given to patient's nurse.   Access used: LIJ trialysis Access issues: no complcations  Total UF removed: 1700 Medication(s) given: none   [facility]

## 2023-12-21 NOTE — Progress Notes (Signed)
 Brief nephrology note:  Primary team concerned for worsening fever and leukocytosis.  Plan for CT A/P with contrast  This is acceptable risk, given his critical state Plan HD later today  Dellis Voght M Jazyiah Yiu

## 2023-12-22 ENCOUNTER — Inpatient Hospital Stay (HOSPITAL_COMMUNITY)

## 2023-12-22 DIAGNOSIS — R6521 Severe sepsis with septic shock: Secondary | ICD-10-CM | POA: Diagnosis not present

## 2023-12-22 DIAGNOSIS — A419 Sepsis, unspecified organism: Secondary | ICD-10-CM | POA: Diagnosis not present

## 2023-12-22 DIAGNOSIS — N179 Acute kidney failure, unspecified: Secondary | ICD-10-CM | POA: Diagnosis not present

## 2023-12-22 DIAGNOSIS — I4891 Unspecified atrial fibrillation: Secondary | ICD-10-CM | POA: Diagnosis not present

## 2023-12-22 LAB — GLUCOSE, CAPILLARY
Glucose-Capillary: 125 mg/dL — ABNORMAL HIGH (ref 70–99)
Glucose-Capillary: 121 mg/dL — ABNORMAL HIGH (ref 70–99)
Glucose-Capillary: 120 mg/dL — ABNORMAL HIGH (ref 70–99)
Glucose-Capillary: 112 mg/dL — ABNORMAL HIGH (ref 70–99)
Glucose-Capillary: 145 mg/dL — ABNORMAL HIGH (ref 70–99)
Glucose-Capillary: 154 mg/dL — ABNORMAL HIGH (ref 70–99)

## 2023-12-22 LAB — RENAL FUNCTION PANEL
Albumin: <1.5 g/dL — ABNORMAL LOW (ref 3.5–5.0)
Albumin: <1.5 g/dL — ABNORMAL LOW (ref 3.5–5.0)
Anion gap: 14 (ref 5–15)
Anion gap: 19 — ABNORMAL HIGH (ref 5–15)
BUN: 77 mg/dL — ABNORMAL HIGH (ref 6–20)
BUN: 103 mg/dL — ABNORMAL HIGH (ref 6–20)
CO2: 22 mmol/L (ref 22–32)
CO2: 20 mmol/L — ABNORMAL LOW (ref 22–32)
Calcium: 7.6 mg/dL — ABNORMAL LOW (ref 8.9–10.3)
Calcium: 7.5 mg/dL — ABNORMAL LOW (ref 8.9–10.3)
Chloride: 96 mmol/L — ABNORMAL LOW (ref 98–111)
Chloride: 92 mmol/L — ABNORMAL LOW (ref 98–111)
Creatinine, Ser: 4.29 mg/dL — ABNORMAL HIGH (ref 0.61–1.24)
Creatinine, Ser: 5.21 mg/dL — ABNORMAL HIGH (ref 0.61–1.24)
GFR, Estimated: 15 mL/min — ABNORMAL LOW (ref >60)
GFR, Estimated: 12 mL/min — ABNORMAL LOW (ref >60)
Glucose, Bld: 123 mg/dL — ABNORMAL HIGH (ref 70–99)
Glucose, Bld: 128 mg/dL — ABNORMAL HIGH (ref 70–99)
Phosphorus: 7.4 mg/dL — ABNORMAL HIGH (ref 2.5–4.6)
Phosphorus: 9.8 mg/dL — ABNORMAL HIGH (ref 2.5–4.6)
Potassium: 5.2 mmol/L — ABNORMAL HIGH (ref 3.5–5.1)
Potassium: 5.4 mmol/L — ABNORMAL HIGH (ref 3.5–5.1)
Sodium: 132 mmol/L — ABNORMAL LOW (ref 135–145)
Sodium: 131 mmol/L — ABNORMAL LOW (ref 135–145)

## 2023-12-22 LAB — HEPATIC FUNCTION PANEL
ALT: 25 U/L (ref 0–44)
AST: 36 U/L (ref 15–41)
Albumin: <1.5 g/dL — ABNORMAL LOW (ref 3.5–5.0)
Alkaline Phosphatase: 97 U/L (ref 38–126)
Bilirubin, Direct: 0.3 mg/dL — ABNORMAL HIGH (ref 0.0–0.2)
Indirect Bilirubin: 0.7 mg/dL (ref 0.3–0.9)
Total Bilirubin: 1 mg/dL (ref 0.0–1.2)
Total Protein: 6 g/dL — ABNORMAL LOW (ref 6.5–8.1)

## 2023-12-22 LAB — CBC WITH DIFFERENTIAL/PLATELET
Abs Immature Granulocytes: 1.18 K/uL — ABNORMAL HIGH (ref 0.00–0.07)
Basophils Absolute: 0.1 K/uL (ref 0.0–0.1)
Basophils Relative: 1 %
Eosinophils Absolute: 0.1 K/uL (ref 0.0–0.5)
Eosinophils Relative: 1 %
HCT: 31.8 % — ABNORMAL LOW (ref 39.0–52.0)
Hemoglobin: 10.6 g/dL — ABNORMAL LOW (ref 13.0–17.0)
Immature Granulocytes: 9 %
Lymphocytes Relative: 6 %
Lymphs Abs: 0.8 K/uL (ref 0.7–4.0)
MCH: 26.7 pg (ref 26.0–34.0)
MCHC: 33.3 g/dL (ref 30.0–36.0)
MCV: 80.1 fL (ref 80.0–100.0)
Monocytes Absolute: 0.8 K/uL (ref 0.1–1.0)
Monocytes Relative: 6 %
Neutro Abs: 10.7 K/uL — ABNORMAL HIGH (ref 1.7–7.7)
Neutrophils Relative %: 77 %
Platelets: 338 K/uL (ref 150–400)
RBC: 3.97 MIL/uL — ABNORMAL LOW (ref 4.22–5.81)
RDW: 15.4 % (ref 11.5–15.5)
Smear Review: NORMAL
WBC: 13.7 K/uL — ABNORMAL HIGH (ref 4.0–10.5)
nRBC: 0.1 % (ref 0.0–0.2)

## 2023-12-22 LAB — AEROBIC/ANAEROBIC CULTURE W GRAM STAIN (SURGICAL/DEEP WOUND)

## 2023-12-22 LAB — CULTURE, BLOOD (ROUTINE X 2)
Culture: NO GROWTH
Special Requests: ADEQUATE

## 2023-12-22 LAB — HEPATITIS B SURFACE ANTIBODY, QUANTITATIVE: Hep B S AB Quant (Post): <3.5 m[IU]/mL — ABNORMAL LOW

## 2023-12-22 LAB — MAGNESIUM: Magnesium: 2.4 mg/dL (ref 1.7–2.4)

## 2023-12-22 MED ORDER — HEPARIN SODIUM (PORCINE) 1000 UNIT/ML DIALYSIS
1000.0000 [IU] | INTRAMUSCULAR | Status: DC | PRN
  Filled 2023-12-22: qty 1

## 2023-12-22 MED ORDER — TRACE MINERALS CU-MN-SE-ZN 300-55-60-3000 MCG/ML IV SOLN
INTRAVENOUS | Status: DC
  Filled 2023-12-22: qty 328.32

## 2023-12-22 MED ORDER — PENTAFLUOROPROP-TETRAFLUOROETH EX AERO: 1.0000 | INHALATION_SPRAY | CUTANEOUS | Status: DC | PRN

## 2023-12-22 MED ORDER — LIDOCAINE-PRILOCAINE 2.5-2.5 % EX CREA: 1.0000 | TOPICAL_CREAM | CUTANEOUS | Status: DC | PRN

## 2023-12-22 MED ORDER — INSULIN ASPART 100 UNIT/ML IJ SOLN
0.0000 [IU] | INTRAMUSCULAR | Status: DC
  Administered 2023-12-22 – 2023-12-23 (×2): 1 [IU] via SUBCUTANEOUS
  Administered 2023-12-23: 3 [IU] via SUBCUTANEOUS
  Administered 2023-12-23: 1 [IU] via SUBCUTANEOUS
  Administered 2023-12-23: 2 [IU] via SUBCUTANEOUS
  Administered 2023-12-23: 3 [IU] via SUBCUTANEOUS
  Administered 2023-12-23: 1 [IU] via SUBCUTANEOUS
  Administered 2023-12-23 – 2023-12-24 (×2): 2 [IU] via SUBCUTANEOUS

## 2023-12-22 MED ORDER — ACETAMINOPHEN 650 MG RE SUPP
650.0000 mg | Freq: Four times a day (QID) | RECTAL | Status: DC
Start: 2023-12-22 — End: 2023-12-22

## 2023-12-22 MED ORDER — METOCLOPRAMIDE HCL 5 MG/ML IJ SOLN
10.0000 mg | Freq: Four times a day (QID) | INTRAMUSCULAR | Status: DC
  Administered 2023-12-22: 10 mg via INTRAVENOUS
  Filled 2023-12-22: qty 2

## 2023-12-22 MED ORDER — HALOPERIDOL LACTATE 5 MG/ML IJ SOLN
2.0000 mg | Freq: Once | INTRAMUSCULAR | Status: AC
  Administered 2023-12-22: 2 mg via INTRAVENOUS
  Filled 2023-12-22: qty 1

## 2023-12-22 MED ORDER — ACETAMINOPHEN 10 MG/ML IV SOLN
1000.0000 mg | Freq: Four times a day (QID) | INTRAVENOUS | Status: AC
  Administered 2023-12-22 – 2023-12-23 (×4): 1000 mg via INTRAVENOUS
  Filled 2023-12-22 (×4): qty 100

## 2023-12-22 MED ORDER — TRACE MINERALS CU-MN-SE-ZN 300-55-60-3000 MCG/ML IV SOLN
INTRAVENOUS | Status: AC
  Filled 2023-12-22: qty 412.73

## 2023-12-22 MED ORDER — LIDOCAINE HCL (PF) 1 % IJ SOLN: 5.0000 mL | INTRAMUSCULAR | Status: DC | PRN

## 2023-12-22 MED ORDER — CHLORHEXIDINE GLUCONATE CLOTH 2 % EX PADS
6.0000 | MEDICATED_PAD | Freq: Every day | CUTANEOUS | Status: DC
  Administered 2023-12-22: 6 via TOPICAL

## 2023-12-22 MED ORDER — METOCLOPRAMIDE HCL 5 MG/ML IJ SOLN
5.0000 mg | Freq: Four times a day (QID) | INTRAMUSCULAR | Status: DC
  Administered 2023-12-22 – 2023-12-24 (×8): 5 mg via INTRAVENOUS
  Filled 2023-12-22 (×8): qty 2

## 2023-12-22 MED ORDER — HYDROMORPHONE HCL 1 MG/ML IJ SOLN
1.0000 mg | INTRAMUSCULAR | Status: DC | PRN
  Administered 2023-12-22 (×2): 3 mg via INTRAVENOUS
  Administered 2023-12-22 – 2023-12-23 (×2): 2 mg via INTRAVENOUS
  Administered 2023-12-23: 1 mg via INTRAVENOUS
  Administered 2023-12-23 (×2): 3 mg via INTRAVENOUS
  Filled 2023-12-22: qty 1
  Filled 2023-12-22 (×2): qty 2
  Filled 2023-12-22: qty 3
  Filled 2023-12-22: qty 2
  Filled 2023-12-22 (×3): qty 3

## 2023-12-22 MED ORDER — HEPARIN SODIUM (PORCINE) 1000 UNIT/ML IJ SOLN
INTRAMUSCULAR | Status: AC
  Filled 2023-12-22: qty 5

## 2023-12-22 NOTE — Progress Notes (Signed)
Pre HD  

## 2023-12-22 NOTE — Progress Notes (Signed)
 Admit: 12/16/2023 LOS: 6  25M with AKI after presenting with encephalopathy, A-fib with RVR/ventricular tachycardia, found to have likely abdominal urinomas with abscess.   Subjective:  HD yesterday: 3.5-hour, UF 1.7 L, stable. Febrile again overnight.  Otherwise vital signs stable.  Remains intubated, 40%.  No pressors. UOP 180 mL. K 5.6->5.2, phosphorus 7.3->7.4, sodium 132->132, Cr 3.75->4.29. CT A/P yesterday: Overall appears improved/stable with the fluid collections.  No renal mass, nephrolithiasis, hydronephrosis.   10/01 0701 - 10/02 0700 In: 1482.4 [I.V.:1237.3; IV Piggyback:245.1] Out: 2380 [Urine:180; Emesis/NG output:370; Drains:130]  Filed Weights   12/21/23 1401 12/21/23 1801 12/22/23 0500  Weight: 97.8 kg 96.1 kg 97.8 kg    Scheduled Meds:  amiodarone   150 mg Intravenous Once   bisacodyl   10 mg Rectal Daily   Chlorhexidine  Gluconate Cloth  6 each Topical Daily   Chlorhexidine  Gluconate Cloth  6 each Topical Q0600   diatrizoate meglumine-sodium  90 mL Per Tube Once   heparin  injection (subcutaneous)  5,000 Units Subcutaneous Q8H   mouth rinse  15 mL Mouth Rinse Q2H   pantoprazole  (PROTONIX ) IV  40 mg Intravenous Daily   sodium chloride  flush  5 mL Intracatheter Q8H   Continuous Infusions:  acetaminophen  Stopped (12/21/23 2245)   amiodarone  30 mg/hr (12/22/23 0700)   dexmedetomidine  (PRECEDEX ) IV infusion 1.2 mcg/kg/hr (12/22/23 0700)   fentaNYL  infusion INTRAVENOUS Stopped (12/20/23 1415)   ketamine  (KETALAR ) adult infusion 0.5 mg/kg/hr (12/22/23 0700)   meropenem (MERREM) IV Stopped (12/21/23 1909)   prismasol BGK 4/2.5 400 mL/hr at 12/20/23 1327   prismasol BGK 4/2.5 400 mL/hr at 12/20/23 1328   prismasol BGK 4/2.5 1,500 mL/hr at 12/20/23 1415   PRN Meds:.acetaminophen  **OR** acetaminophen , alteplase, heparin , heparin  sodium (porcine), HYDROmorphone  (DILAUDID ) injection, HYDROmorphone  (DILAUDID ) injection, lidocaine  (PF), lidocaine -prilocaine, mouth rinse,  pentafluoroprop-tetrafluoroeth, sodium chloride   Current Labs: reviewed    Physical Exam:  Blood pressure 117/74, pulse 69, temperature 99.7 F (37.6 C), resp. rate (!) 23, height 5' 5 (1.651 m), weight 97.8 kg, SpO2 98%. GEN: Intubated, wakes to painful stimuli ENT: ET tube in place, NG tube in place, LI[facility] CV: Regular, normal S1 and S2 PULM: Bilateral mechanical breath sound ABD: Distended abdomen, grimaces to palpation. EXT: no LEE  A AKI, anuric, baseline normal GFR.  No evidence of obstruction on imaging at presentation.  Suspect ATN.  Repeat bladder pressure not consistent with abdominal compartment syndrome on 9/27.  CT A/P, 10/1: Renal unremarkable. Hyponatremia, stable; Cont RRT Hyperkalemia: Likely due to renal failure.  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 tach at presentation, cardiology following on amiodarone  Septic shock on linezolid  and Zosyn , Off pressors Hyperphosphatemia setting of AKI, worsening.  Continue RRT Metabolic acidosis, stable Recurrent fevers  P Stopped CRRT 9/30. Failed Lasix  stress test.  HD, 10/1 Electrolyte derangements recurred despite HD yesterday.  Will plan additional session today. Plan for HD assess tomorrow Daily weights, Daily Renal Panel, Strict I/Os, Avoid nephrotoxins (NSAIDs, judicious IV Contrast)  Medication Issues; Preferred narcotic agents for pain control are hydromorphone , fentanyl , and methadone. Morphine  should not be used.  Baclofen should be avoided Avoid oral sodium phosphate  and magnesium  citrate based laxatives / bowel preps   Discussed with primary team  Dr. Evalene HERO Purnell Daigle  12/22/2023, 7:13 AM  Recent Labs  Lab 12/21/23 [phone] 12/21/23 1829  12/22/23 0359  NA 132* 132* 132*  K 5.6* 4.1 5.2*   CL 101 95* 96*  CO2 20* 23 22  GLUCOSE 132* 123* 123*  BUN 77* 50* 77*  CREATININE 3.75* 2.88* 4.29*  CALCIUM  7.7* 7.7* 7.6*  PHOS 7.3* 4.7* 7.4*   Recent Labs  Lab 12/20/23 0414 12/21/23 0432 12/22/23 0359  WBC 13.8* 16.9* 13.7*  NEUTROABS 10.9* 13.5* 10.7*  HGB 10.6* 10.5* 10.6*  HCT 31.4* 32.0* 31.8*  MCV 80.3 81.4 80.1  PLT 281 310 338

## 2023-12-22 NOTE — Progress Notes (Signed)
 NAME:  Glenn Stewart, MRN:  [id number], DOB:  Jul 19, 1965, LOS: 6 ADMISSION DATE:  12/16/2023, CONSULTATION DATE:  12/16/23 REFERRING MD:  Dr. Cottie, CHIEF COMPLAINT:  weakness   bRIEF  HPI obtained from EMR as pt is intubated and sedated.   66 yoM with PMH of HTN, CAD, CABG x 5 04/2022 with post op Afib since stopped on Eliquis  and amiodarone  09/2022, HTN, HLD, GERD, NASH, bipolar/ ADHD and s/p recent robotic assisted laparoscopic radial prostatectomy on 11/17/23 who presented for weakness and waxing mental status with distended and tender abdominal by EMS found with episodes of Afib with RVR and VT.  Was hemodynamically unstable on arrival, underwent emergent cardioversion and given amiodarone  x2 and magnesium , with emerging rhythm of afib with RVR possible flutter.  Cardiology consulted. Post cardioversion became hypotensive with respiratory distress therefore intubated and placed on norepinephrine .  Difficulty oxygenating post intubation with normal PIP requiring 1.0 and 12 PEEP.  Labs noted for K2.8, Na 129, bicarb 16, BUN/ sCr 55/ 3.92 (recently  ), t. Bili 3.4, AST 68, albumin  1.6, protein 3.4, INR 1.2.  CTH neg, CTA PE and CT a/p w/contrast pending official read.  2.1L of brownish OGT contents s/p OGT placement. Cultures sent, empiric zosyn / vanc started.  Afebrile, anuric and since weaned off pressors.  R internal jugular CVL placed by EDP.  PCCM consulted for admission.     NOTE:LD him as outpatient, patient self-irrigated catheter a couple days after initial surgery and then removed catheter himself prior to follow-up at 1 week. Patient reported that he cut tubing prior to removal but unable to confirm if removal was truly atraumat   Pertinent  Medical History    has a past medical history of ADHD (attention deficit hyperactivity disorder), Anxiety state, unspecified, Bipolar 1 disorder (HCC), Cancer (HCC), CHEST PAIN (12/04/2008), Coronary artery disease, Depressive disorder, not elsewhere  classified, Dermatophytosis of nail, Esophageal reflux, History of kidney stones, HYPERGLYCEMIA (07/10/2007), Low back pain (09/18/2013), Other chronic nonalcoholic liver disease, Pre-diabetes, Problems with hearing, Prostatitis, unspecified, Pure hypercholesterolemia, Screen for STD (sexually transmitted disease) (04/20/2011), Sprain of cruciate ligament of knee, Tear of medial cartilage or meniscus of knee, current, TMJ (temporomandibular joint disorder) (12/25/2010), Unspecified essential hypertension, Unspecified gastritis and gastroduodenitis without mention of hemorrhage, and Urethritis, unspecified.   has a past surgical history that includes No prior surgery; LEFT HEART CATH AND CORONARY ANGIOGRAPHY (N/A, 05/10/2022); Coronary artery bypass graft (N/A, 05/12/2022); Radial artery harvest (Left, 05/12/2022); TEE without cardioversion (N/A, 05/12/2022); Cystoscopy w/ litholapaxy / EHL; and Robot assisted laparoscopic radical prostatectomy (N/A, 11/17/2023).   Significant Hospital Events: Including procedures, antibiotic start and stop dates in addition to other pertinent events   9/26> admit 9/27: 90% fio2 on ventilator. On diprivan  gtt, fent gtt, amio gtt. CVP 9.  AFebrile. Cutlure negative so far. cReat 5.13 and climbing (Baseline 0.91 on 8/292/25). AKI started 12/16/23. Normal lactate byu Ionized calcim is low. Seen by urology  & CCS- recommending  IR attemt of intrabdominal abscess that is in RLQ and tracks down bladder..  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 CCS does not think there is appendicit or diveritculits. And that SBO/Colitis is secondary to urinoma. Off levop and followed commands. aNURIC - 100cc only since admit 9/27 CT IMPRESSION: . There is focal irregularity at the junction of the bladder base and urethra with small anterior contrast pooling suggesting contained perforation. No intraperitoneal contrast extravasation. Appearance examination is otherwise unchanged. Again  demonstrated re multiple loculated fluid collections  in the abdomen and pelvis onsistent with abscesses. Wall thickening of the cecum and terminal ileum. Fluid-filled dilated small bowel appear mildly improved.  Consolidation or atelectasis in both lung bases, possibly pneumonia, compressive atelectasis, or aspiration. Small bilateral pleural effusions  . Wall thickening of the ascending colon and hepatic flexure, new since prior study. This may indicate colitis or inflammatory bowel disease versus reactive change. Dilated fluid-filled small bowel with air-fluid levels and mild diffuse wall thickening. Terminal ileum is decompressed. This is consistent with small-bowel obstruction, possibly due to inflammatory strictures. IR Procedure: DRAINAGE CATHETER PLACEMENT into a RIGHT LOWER QUADRANT FLUID COLLECTION ->> 1L drained HD CATH Placed CRRT started 9/28 - On vent. Ongoing CRRT. Seen by cCS  - still concernd about Urinoma and secondary blowe irritation and not a true obstruction NGT output is low c/w ileus. CCS does  NOT want enteral medication. Recommnding max only trophic feds. CUlture negative. Afebrile.  Stopped on Zyvox  and Zosyn  9/29 SBO Xr protocol per Gen Surgery with oral contrast pooling in the gastric fundus. MSSA in abdominal cultures and Enterobacter cloacae in Ucx - on Cefepime and Flagyl  SBT trial daiiled secondary to RR >40 and increased WOB. Back on PRVC 9/30 discontinued CRRT.  10/1 - switched to meropenem. Repeat CT A/P with stable intraabdominal abscesses. Started iHD   Interim History / Subjective:  Intermittent fevers. Failed SBT for tachypnea again. CXR with low lung volumes and bibasilar opacities seen before. iHD on 10/1. Patient endorsing abdominal pain. Trial of reglan this AM with stool output.   Objective    Blood pressure 117/74, pulse 69, temperature 99.7 F (37.6 C), resp. rate (!) 23, height 5' 5 (1.651 m), weight 97.8 kg, SpO2 98%. CVP:  [3 mmHg-14 mmHg] 4 mmHg   Vent Mode: PRVC FiO2 (%):  [40 %] 40 % Set Rate:  [20 bmp] 20 bmp Vt Set:  [490 mL] 490 mL PEEP:  [5 cmH20] 5 cmH20 Plateau Pressure:  [14 cmH20-18 cmH20] 15 cmH20   Intake/Output Summary (Last 24 hours) at 12/22/2023 0738 Last data filed at 12/22/2023 0700 Gross per 24 hour  Intake 1482.36 ml  Output 2380 ml  Net -897.64 ml  2L removed during iHD  Filed Weights   12/21/23 1401 12/21/23 1801 12/22/23 0500  Weight: 97.8 kg 96.1 kg 97.8 kg    Examination: General: Ill appearing man lying in bed in NAD, on ventilator Head: ETT and OG tube to LIWS CV: RRR. No murmurs, rubs, or gallops. No LE edema Pulmonary: Mild bilateral rales anteriolaterallu Abdominal: soft, tender, distended. Inreased bowel sounds this AM Extremities:  moving purposefully Skin: Warm and dry.  Neuro: Hard of hearing. Alert. Squeezing hands and moving lower extremities Psych: mildly agitated    Labs reviewed: WBC 16.9>13.7 Neut 13.5>10.7 Hgb 10.6 - K 4.1 > 5.2 Cr 2.88>4.29 Phos 4.7>7.4 Direct bili 0.3 Assessment and Plan   Prostate cancer s/p robotic assisted laparoscopic radial prostatectomy on 11/17/23 S/p IR drainage on 9/27 Enterobacter cloacae in Urine Cx Abdominal abscess with MSSA Improving leukocytosis. Suspect fever a SIRS response to inflammation with intra-abdominal processes. CT A/p w oral and IV contrast with mildly decreased/stable intraabdominal abscesses. No new collections, reassuring. - Stopped on Zyvox  (9/27 - - Stopped Zosyn  (9/26 - - Cefepime and flagyl  9/29 - 9/30 - Meropenem 10/1 - - tracheal aspirate pending - 9/30 Bcx - NGTD - Will likely need extended antibiotic coverage  Ileus, improving intraabdominal fluid collections and abscess at anterior anastomosis Distal colon enterocolitis CT A/P  without overt obstruction. Oral contrast given. S/p reglan with large unmeasured stool output - NG to LIWS - Reglan q6HR; renally adjusted - Will discuss with Gen Surgery trialing  trickle feeds - Continue on GNR and anaerobic coverage - Pain control with Ketamine , tylenol , and PRN dilaudid   Acute hypoxic and hypercarbic respiratory failure Aspiration of BLL PNA CTA PE neg for acute PE.  - Abx for possible pna coverage 9/26 - 9/30 - Continues on Abx for above - PRVC with VAP bundle - Reassess daily; SBT today as tolerated - Strep neg; legionella pending - Trial haldol to help with agitation while on SBT  Afib VT, monomorphic Hx CAD, prior CABG x 5 04/2022, and post op Afib (off amio/ eliquis  09/2022), HTN, HLD - s/p emergent cardioversion during this admission - Will continue on amiodarone  drip; PO when able - Admission echo ef 55% without wall motion abnormalities - appreciate cardiology recommendations  AKI since 9/27 post surgery 10/2023 Prostatectomy with complicated post op course Per Dr. Cam, urologist, patient was self irrigating bladder through foley and removed the foley cath 4 days ahead of schedule Worsening renal function with oliguria despite iHD on 10/1 - Discontinued CRRT on 9/30 - repeat iHD today - Foley to remain in place; repeat cystogram ~10/7-10 per urology  Hyperkalemia Likely in setting on AKI and discontinuation of CRRT - Managed with HD  Acute on chronic anemia  Critical illness 10.6 today. stable - Continue to monitor - Transfuse for Hgb <8  Elevated AST - stable Elevated T bili - improving  Neuro/Encephalopathy  ?concern for bipolar disorder; see Dr. Monte note from 9/30 - On sedation - trial of haldol for agitation today  Best practice:  Diet: TPN starting today; will discuss TF trial as well with Gen surgery Pain/Anxiety/Delirium protocol (if indicated): Diprivan  gtt and Fent gtt VAP protocol (if indicated): YES DVT prophylaxis: heparin  5000 tid 12/17/23 GI prophylaxis: PPI Glucose control: ssi Mobility: bed rest Code Status: full code DISPOSITION: ICU  Family Communication:   - Called Mom Rock Spanner [phone] - she said she is 18 and deferred that I called daughter but did inform her that he is on life support  - Called Sister Lucian Pond [phone]- called and  updated 9:22 AM 12/17/2023 (she is in [place] and planning to come). She 0  - 12/18/23:   Called  sister Lucian Pond - and updated  - 9/29: attempted without success.  - 9/30: discussed with sister Tena  - 10/1 -  RN discussed with sister Randine ENGLAND   Hadassah Kristy Ahr, MD [facility] Internal Medicine Program - PGY-3 12/22/2023, 7:38 AM  LABS    PULMONARY Recent Labs  Lab 12/16/23 2124 12/17/23 0422 12/17/23 0908 12/17/23 1002 12/17/23 1037 12/19/23 1035  PHART 7.301* 7.292* 7.346*  --  7.349* 7.383  PCO2ART 41.3 42.5 36.8  --  34.2 40.3  PO2ART 102 95 116*  --  91 78*  HCO3 20.4 20.6 20.1  --  18.8* 24.0  TCO2 22 22 21*  --  20* 25  O2SAT 97 97 98 77.7 97 95    CBC Recent Labs  Lab 12/20/23 0414 12/21/23 0432 12/22/23 0359  HGB 10.6* 10.5* 10.6*  HCT 31.4* 32.0* 31.8*  WBC 13.8* 16.9* 13.7*  PLT 281 310 338    COAGULATION Recent Labs  Lab 12/16/23 1524  INR 1.2    CARDIAC  No results for input(s): TROPONINI in the last  168 hours. No results for input(s): PROBNP in the last 168 hours.   CHEMISTRY Recent Labs  Lab 12/18/23 0336 12/18/23 1615 12/19/23 0417 12/19/23 1035 12/20/23 0414 12/20/23 0848 12/20/23 1657 12/21/23 0432 12/21/23 1829 12/22/23 0359  NA 127*  126*   < > 131*   < > 131* 131* 135 132* 132* 132*  K 4.1  4.1   < > 4.2   < > 4.5 4.6 5.2* 5.6* 4.1 5.2*  CL 93*  91*   < > 99   < > 97* 98 98 101 95* 96*  CO2 20*  20*   < > 21*   < > 21* 23 22 20* 23 22  GLUCOSE 130*  129*   < > 141*   < > 104* 109* 120* 132* 123* 123*  BUN 68*  68*   < > 47*   < > 45* 45* 48* 77* 50* 77*  CREATININE 4.55*  4.39*   < > 2.99*   < > 2.61* 2.67* 2.63* 3.75* 2.88* 4.29*  CALCIUM  7.0*  7.0*   < > 7.7*   < > 7.7* 7.8* 7.8* 7.7* 7.7* 7.6*  MG 2.8*  --   3.0*  --  2.8*  --   --  2.8*  --  2.4  PHOS 6.1*   < > 3.3   < > 3.3 3.5 4.9* 7.3* 4.7* 7.4*   < > = values in this interval not displayed.   Estimated Creatinine Clearance: 19.9 mL/min (A) (by C-G formula based on SCr of 4.29 mg/dL (H)).   LIVER Recent Labs  Lab 12/16/23 1524 12/17/23 0333 12/18/23 0336 12/18/23 1615 12/19/23 0416 12/19/23 0417 12/20/23 0414 12/20/23 0848 12/20/23 1657 12/21/23 0432 12/21/23 1200 12/21/23 1829 12/22/23 0359  AST 68*   < > 85*  --  62*  --  44*  --   --   --  38  --  36  ALT 37   < > 41  --  34  --  27  --   --   --  22  --  25  ALKPHOS 69   < > 105  --  116  --  122  --   --   --  112  --  97  BILITOT 3.4*   < > 3.1*  --  2.3*  --  1.7*  --   --   --  1.3*  --  1.0  PROT 5.2*   < > 5.4*  --  5.6*  --  6.2*  --   --   --  6.1*  --  6.0*  ALBUMIN  1.6*   < > <1.5*  <1.5*   < > <1.5*   < > <1.5*  <1.5*   < > <1.5* <1.5* <1.5* <1.5* <1.5*  <1.5*  INR 1.2  --   --   --   --   --   --   --   --   --   --   --   --    < > = values in this interval not displayed.     INFECTIOUS Recent Labs  Lab 12/16/23 1600 12/16/23 1839 12/16/23 2023  LATICACIDVEN 3.5* 1.2 1.3     ENDOCRINE CBG (last 3)  Recent Labs    12/21/23 1927 12/21/23 2329 12/22/23 0355  GLUCAP 129* 112* 125*     IMAGING x48h  - image(s) personally visualized  -   highlighted in bold DG CHEST  PORT 1 VIEW Result Date: 12/21/2023 EXAM: 1 VIEW(S) XRAY OF THE CHEST 12/21/2023 05:57:00 PM COMPARISON: 12/19/2023 CLINICAL HISTORY: Dyspnea FINDINGS: LINES, TUBES AND DEVICES: Endotracheal tube in place with tip 2.1 cm above carina. Enteric tube with tip below the diaphragm and sidehole overlying the stomach. Right internal jugular central venous catheter in place with tip over right atrium. Left internal jugular central venous catheter in place with tip over brachiocephalic vein. LUNGS AND PLEURA: Low lung volumes. Bibasilar patchy opacities. Small left pleural effusion. No  pneumothorax. No pulmonary edema. HEART AND MEDIASTINUM: Median sternotomy wires and mediastinal clips noted. No acute abnormality of the cardiac and mediastinal silhouettes. BONES AND SOFT TISSUES: No acute osseous abnormality. IMPRESSION: 1. Low lung volumes and bibasilar patchy opacities. 2. Small left pleural effusion. 3. Support devices as above. Electronically signed by: Donnice Mania MD 12/21/2023 07:14 PM EDT RP Workstation: [id number]   CT ABDOMEN PELVIS W CONTRAST Result Date: 12/21/2023 CLINICAL DATA:  Intraabdominal abscess EXAM: CT ABDOMEN AND PELVIS WITH CONTRAST TECHNIQUE: Multidetector CT imaging of the abdomen and pelvis was performed using the standard protocol following bolus administration of intravenous contrast. RADIATION DOSE REDUCTION: This exam was performed according to the departmental dose-optimization program which includes automated exposure control, adjustment of the mA and/or kV according to patient size and/or use of iterative reconstruction technique. CONTRAST:  75mL OMNIPAQUE  IOHEXOL  350 MG/ML SOLN COMPARISON:  12/20/2023, 12/17/2023, 12/16/2023 FINDINGS: Lower chest: No focal airspace consolidation or pleural effusion.Posterior bibasilar dependent atelectasis. Dense multi-vessel coronary atherosclerosis. Sternotomy wires and postsurgical changes of a prior CABG. Hepatobiliary: No mass.Decompressed gallbladder containing a few small radiopaque stones. Apparent enhancing soft tissue in the gallbladder fundus, may represent normal decompressed gallbladder wall in close approximation, or focal adenomyomatosis. No intrahepatic or extrahepatic biliary ductal dilation. The portal veins are patent. Pancreas: No mass or main ductal dilation. No peripancreatic inflammation or fluid collection. Spleen: Normal size. No mass. Adrenals/Urinary Tract: Similar multifocal nodularity in the left adrenal gland, with the largest nodule measuring 1.9 cm. There is also likely a small adrenal nodule  in the medial right adrenal gland (axial 37). No renal mass. No nephrolithiasis or hydronephrosis. The urinary bladder is completely decompressed with a low lying urinary catheter in place. Stomach/Bowel: Esophagogastric tube terminates in the stomach, well-positioned. Enteric contrast present within the stomach and proximal small bowel. While a couple of segments of small bowel in the left upper quadrant are dilated, no abrupt transition point present to suggest small bowel obstruction. Fluid present within the proximal colon.Normal appendix. Sigmoid colonic diverticulosis. No changes of acute diverticulitis. Vascular/Lymphatic: No aortic aneurysm. No intraabdominal or pelvic lymphadenopathy. Reproductive: Prostatectomy.No free pelvic fluid. Other: No pneumoperitoneum. Similar diffuse mesenteric stranding with perinephric and retroperitoneal stranding, more pronounced on the right. Interval decompression of the right retroperitoneal collection, measuring 2.8 x 7.7 cm (previously 5.6 x 10.6 cm, by my measurement), with a similarly well positioned percutaneous pigtail drainage catheter in the lower portion of the collection. Multiple small low-density fluid collections along the right lateral omentum and right paracolic gutter, measuring up to 2.4 x 4.1 cm (axial 56). A small fluid collection along the ventral right abdominal wall (axial 73), is unchanged measuring 1.9 x 7.1 cm. The irregular fluid collection in the right lower quadrant extending to the midline ventral abdominal wall, measures 6.5 x 14.4 x 13.3 cm (APxTRxCC), axial 80. Right lower quadrant, ileocolic mesentery collection appears similar measuring 4.2 x 4.4 cm (axial 56). The fluid collections along the pelvic sidewall are improving  in the interim. For example, the right sided collection measures 5.6 x 2.5 cm (previously 6.5 x 4.7 cm, axial 93). Musculoskeletal: No acute fracture or destructive lesion. Multilevel degenerative disc disease of the  spine. IMPRESSION: 1. Interval decompression of the right retroperitoneal fluid collection, measuring 2.8 x 7.7 cm (previously, 5.6 x 10.6 cm, by my measurement). The pigtail percutaneous drainage catheter is otherwise similarly well-positioned in the fluid collection. 2. irregular fluid collection in the right lower quadrant extending to the midline ventral abdominal wall, measures 6.5 x 14.4 x 13.3 cm (axial 80), unchanged. 3. The bilateral pelvic sidewall fluid collections have decreased in size in the interim, measuring 5.6 x 2.5 cm on the right (previously, 6.5 x 4.7 cm). 4. Multiple small additional fluid collections in the right ileocolic mesentery, along the right paracolic gutter, and the right lower quadrant ventral abdominal wall are unchanged, as described above. 5. Similarly positioned low-lying catheter in the urinary bladder, which may be expected positioning status post prostatectomy. 6. Well-positioned esophagogastric tube terminating in the stomach. While a couple of segments of small bowel in the upper abdomen are distended, no abrupt transition point present to suggest small bowel obstruction. Aortic Atherosclerosis (ICD10-I70.0). Electronically Signed   By: Rogelia Myers M.D.   On: 12/21/2023 12:55   DG Abd Portable 1V Result Date: 12/20/2023 CLINICAL DATA:  Small-bowel obstruction. EXAM: PORTABLE ABDOMEN - 1 VIEW COMPARISON:  12/19/2023 FINDINGS: Pigtail catheter again seen in the right lower quadrant. Nasogastric tube is seen with tip in the gastric body. Moderate to markedly dilated small bowel loops are again seen in the central and left abdomen, without significant change since previous study. No significant colonic gas or stool seen. These findings remain consistent with a small-bowel obstruction. IMPRESSION: No apparent change in small-bowel obstruction since prior study. Nasogastric tube tip in gastric body. Electronically Signed   By: Norleen DELENA Kil M.D.   On: 12/20/2023 09:15

## 2023-12-22 NOTE — Progress Notes (Signed)
 Nutrition Follow-up  DOCUMENTATION CODES:  Not applicable  INTERVENTION:  Recommend initiation of TPN as pt has been without nutrition x 6 days CVC already in place When able, trial tube feeding via OGT. Recommend the following: Vital 1.5 at 60 ml/h (1440 ml per day) Start at 20 and hold until pt showing signs of tolerance. Then advance by 10mL every 12 hours to reach goal Prosource TF20 60 ml 1x/d Provides 2240 kcal, 117 gm protein, 1100 ml free water  daily  NUTRITION DIAGNOSIS:  Increased nutrient needs related to acute illness as evidenced by estimated needs. - remains applicable  GOAL:  Patient will meet greater than or equal to 90% of their needs - progressing  MONITOR:  TF tolerance, Vent status, Labs  REASON FOR ASSESSMENT:  Consult New TPN/TNA  ASSESSMENT:  Pt with hx of CAD s/p CABGx5, HTN, HLD, GERD, atrial fibrillation, NASH, and s/p recent robotic assisted laparoscopic radial prostatectomy on 11/17/23 presented to ED from a urology appointment with poor level of consciousness.  9/26 - presented to ED, intubated, cardioversion 9/27 - drain placed in IR to RLQ, CRRT initiated 9/28 - trickles initiated 9/29 - TF held - SBO protocol ordered  9/30 - CRRT stopped 10/1 - SBO protocol repeated  Pt resting in bed at the time of assessment. Remains intubated, no family at bedside. Pt s/p SBO protocol x 2 but surgery notes imaging similar to yesterday. Discussed with team, will likely be several more days before able to trial nutrition/start advancing. Ok to start TPN. Pt did have a BM this AM which is encouraging as last BM was on admission 9/26.   Pt underwent HD at bedside yesterday, 1.7L removed. Nephrology planning for repeat session today.  MV: 13.1 L/min Temp (24hrs), Avg:100 F (37.8 C), Min:99.1 F (37.3 C), Max:101.1 F (38.4 C) MAP (cuff): 87-141mmHg  Admit weight: 103.8 kg   Current weight: 102.2 kg  No weight loss noted in hx, gain over the last ~ 1  year   Intake/Output Summary (Last 24 hours) at 12/22/2023 1031 Last data filed at 12/22/2023 0900 Gross per 24 hour  Intake 1329.13 ml  Output 2342 ml  Net -1012.87 ml  Net IO Since Admission: -3,205.23 mL [12/22/23 1031]  Drains/Lines: CVC Right IJ, triple lumen Non tunneled left IJ, temporary HD line, triple lumen OGT 18 Fr. x 24 hours Drain, RLQ, out x 24 hours UOP x 24 hours  Nutritionally Relevant Medications: Scheduled Meds:  bisacodyl   10 mg Rectal Daily   metoCLOPramide (REGLAN)   5 mg Intravenous Q6H   pantoprazole    40 mg Intravenous Daily   Continuous Infusions:  meropenem (MERREM) IV Stopped (12/21/23 1909)   Labs Reviewed: Sodium 132, chloride 96 Potassium 5.2 BUN 77, creatinine 4.29 Phosphorus 7.4 CBG ranges from 111-129 mg/dL over the last 24 hours HgbA1c 6.0% (9/27)  NUTRITION - FOCUSED PHYSICAL EXAM: Flowsheet Row Most Recent Value  Orbital Region No depletion  Upper Arm Region No depletion  Thoracic and Lumbar Region No depletion  Buccal Region No depletion  Temple Region No depletion  Clavicle Bone Region No depletion  Clavicle and Acromion Bone Region No depletion  Scapular Bone Region No depletion  Dorsal Hand No depletion  Patellar Region No depletion  Anterior Thigh Region No depletion  Posterior Calf Region No depletion  Edema (RD Assessment) Moderate  Hair Reviewed  Eyes Reviewed  Mouth Reviewed  Skin Reviewed  Nails Reviewed    Diet Order:   Diet Order  Diet NPO time specified  Diet effective now                   EDUCATION NEEDS:  Not appropriate for education at this time  Skin:  Skin Assessment: Reviewed RN Assessment DTI: bilateral buttocks (4 x 3 cm)  Last BM:  10/2  Height:  Ht Readings from Last 1 Encounters:  12/17/23 5' 5 (1.651 m)    Weight:  Wt Readings from Last 1 Encounters:  12/22/23 97.8 kg    Ideal Body Weight:  61.8 kg  BMI:  Body mass index is 35.88  kg/m.  Estimated Nutritional Needs:  Kcal:  2000-2200 kcal/d Protein:  110-125g/d Fluid:  2L/d    Vernell Lukes, RD, LDN, CNSC Registered Dietitian II Please reach out via secure chat

## 2023-12-22 NOTE — Progress Notes (Signed)
 Subjective/Chief Complaint: Pt with no acute changes   Objective: Vital signs in last 24 hours: Temp:  [99.3 F (37.4 C)-101.1 F (38.4 C)] 99.7 F (37.6 C) (10/02 0700) Pulse Rate:  [66-87] 69 (10/02 0700) Resp:  [20-31] 23 (10/02 0700) BP: (90-162)/(62-94) 117/74 (10/02 0700) SpO2:  [91 %-100 %] 98 % (10/02 0700) FiO2 (%):  [40 %] 40 % (10/02 0418) Weight:  [96.1 kg-97.8 kg] 97.8 kg (10/02 0500) Last BM Date : 01/15/24  Intake/Output from previous day: 10/01 0701 - 10/02 0700 In: 1482.4 [I.V.:1237.3; IV Piggyback:245.1] Out: 2380 [Urine:180; Emesis/NG output:370; Drains:130] Intake/Output this shift: No intake/output data recorded.  PE: Gen:  acutely ill appearing Card:  Regular rate and rhythm Pulm:  ventilated respirations Abd: soft, moderate distention - stable, NTTP, no rebound tenderness, drain to gravity appears serous Skin: warm and dry, no rashes Psych: unable to assess  Lab Results:  Recent Labs    12/21/23 0432 12/22/23 0359  WBC 16.9* 13.7*  HGB 10.5* 10.6*  HCT 32.0* 31.8*  PLT 310 338   BMET Recent Labs    12/21/23 1829 12/22/23 0359  NA 132* 132*  K 4.1 5.2*  CL 95* 96*  CO2 23 22  GLUCOSE 123* 123*  BUN 50* 77*  CREATININE 2.88* 4.29*  CALCIUM  7.7* 7.6*   PT/INR No results for input(s): LABPROT, INR in the last 72 hours. ABG Recent Labs    12/19/23 1035  PHART 7.383  HCO3 24.0    Studies/Results: DG CHEST PORT 1 VIEW Result Date: 12/21/2023 EXAM: 1 VIEW(S) XRAY OF THE CHEST 12/21/2023 05:57:00 PM COMPARISON: 12/19/2023 CLINICAL HISTORY: Dyspnea FINDINGS: LINES, TUBES AND DEVICES: Endotracheal tube in place with tip 2.1 cm above carina. Enteric tube with tip below the diaphragm and sidehole overlying the stomach. Right internal jugular central venous catheter in place with tip over right atrium. Left internal jugular central venous catheter in place with tip over brachiocephalic vein. LUNGS AND PLEURA: Low lung volumes.  Bibasilar patchy opacities. Small left pleural effusion. No pneumothorax. No pulmonary edema. HEART AND MEDIASTINUM: Median sternotomy wires and mediastinal clips noted. No acute abnormality of the cardiac and mediastinal silhouettes. BONES AND SOFT TISSUES: No acute osseous abnormality. IMPRESSION: 1. Low lung volumes and bibasilar patchy opacities. 2. Small left pleural effusion. 3. Support devices as above. Electronically signed by: Donnice Mania MD 12/21/2023 07:14 PM EDT RP Workstation: [id number]   CT ABDOMEN PELVIS W CONTRAST Result Date: 12/21/2023 CLINICAL DATA:  Intraabdominal abscess EXAM: CT ABDOMEN AND PELVIS WITH CONTRAST TECHNIQUE: Multidetector CT imaging of the abdomen and pelvis was performed using the standard protocol following bolus administration of intravenous contrast. RADIATION DOSE REDUCTION: This exam was performed according to the departmental dose-optimization program which includes automated exposure control, adjustment of the mA and/or kV according to patient size and/or use of iterative reconstruction technique. CONTRAST:  75mL OMNIPAQUE  IOHEXOL  350 MG/ML SOLN COMPARISON:  12/20/2023, 12/17/2023, 12/16/2023 FINDINGS: Lower chest: No focal airspace consolidation or pleural effusion.Posterior bibasilar dependent atelectasis. Dense multi-vessel coronary atherosclerosis. Sternotomy wires and postsurgical changes of a prior CABG. Hepatobiliary: No mass.Decompressed gallbladder containing a few small radiopaque stones. Apparent enhancing soft tissue in the gallbladder fundus, may represent normal decompressed gallbladder wall in close approximation, or focal adenomyomatosis. No intrahepatic or extrahepatic biliary ductal dilation. The portal veins are patent. Pancreas: No mass or main ductal dilation. No peripancreatic inflammation or fluid collection. Spleen: Normal size. No mass. Adrenals/Urinary Tract: Similar multifocal nodularity in the left adrenal gland, with the largest  nodule  measuring 1.9 cm. There is also likely a small adrenal nodule in the medial right adrenal gland (axial 37). No renal mass. No nephrolithiasis or hydronephrosis. The urinary bladder is completely decompressed with a low lying urinary catheter in place. Stomach/Bowel: Esophagogastric tube terminates in the stomach, well-positioned. Enteric contrast present within the stomach and proximal small bowel. While a couple of segments of small bowel in the left upper quadrant are dilated, no abrupt transition point present to suggest small bowel obstruction. Fluid present within the proximal colon.Normal appendix. Sigmoid colonic diverticulosis. No changes of acute diverticulitis. Vascular/Lymphatic: No aortic aneurysm. No intraabdominal or pelvic lymphadenopathy. Reproductive: Prostatectomy.No free pelvic fluid. Other: No pneumoperitoneum. Similar diffuse mesenteric stranding with perinephric and retroperitoneal stranding, more pronounced on the right. Interval decompression of the right retroperitoneal collection, measuring 2.8 x 7.7 cm (previously 5.6 x 10.6 cm, by my measurement), with a similarly well positioned percutaneous pigtail drainage catheter in the lower portion of the collection. Multiple small low-density fluid collections along the right lateral omentum and right paracolic gutter, measuring up to 2.4 x 4.1 cm (axial 56). A small fluid collection along the ventral right abdominal wall (axial 73), is unchanged measuring 1.9 x 7.1 cm. The irregular fluid collection in the right lower quadrant extending to the midline ventral abdominal wall, measures 6.5 x 14.4 x 13.3 cm (APxTRxCC), axial 80. Right lower quadrant, ileocolic mesentery collection appears similar measuring 4.2 x 4.4 cm (axial 56). The fluid collections along the pelvic sidewall are improving in the interim. For example, the right sided collection measures 5.6 x 2.5 cm (previously 6.5 x 4.7 cm, axial 93). Musculoskeletal: No acute fracture or  destructive lesion. Multilevel degenerative disc disease of the spine. IMPRESSION: 1. Interval decompression of the right retroperitoneal fluid collection, measuring 2.8 x 7.7 cm (previously, 5.6 x 10.6 cm, by my measurement). The pigtail percutaneous drainage catheter is otherwise similarly well-positioned in the fluid collection. 2. irregular fluid collection in the right lower quadrant extending to the midline ventral abdominal wall, measures 6.5 x 14.4 x 13.3 cm (axial 80), unchanged. 3. The bilateral pelvic sidewall fluid collections have decreased in size in the interim, measuring 5.6 x 2.5 cm on the right (previously, 6.5 x 4.7 cm). 4. Multiple small additional fluid collections in the right ileocolic mesentery, along the right paracolic gutter, and the right lower quadrant ventral abdominal wall are unchanged, as described above. 5. Similarly positioned low-lying catheter in the urinary bladder, which may be expected positioning status post prostatectomy. 6. Well-positioned esophagogastric tube terminating in the stomach. While a couple of segments of small bowel in the upper abdomen are distended, no abrupt transition point present to suggest small bowel obstruction. Aortic Atherosclerosis (ICD10-I70.0). Electronically Signed   By: Rogelia Myers M.D.   On: 12/21/2023 12:55   DG Abd Portable 1V Result Date: 12/20/2023 CLINICAL DATA:  Small-bowel obstruction. EXAM: PORTABLE ABDOMEN - 1 VIEW COMPARISON:  12/19/2023 FINDINGS: Pigtail catheter again seen in the right lower quadrant. Nasogastric tube is seen with tip in the gastric body. Moderate to markedly dilated small bowel loops are again seen in the central and left abdomen, without significant change since previous study. No significant colonic gas or stool seen. These findings remain consistent with a small-bowel obstruction. IMPRESSION: No apparent change in small-bowel obstruction since prior study. Nasogastric tube tip in gastric body.  Electronically Signed   By: Norleen DELENA Kil M.D.   On: 12/20/2023 09:15    Anti-infectives: Anti-infectives (From admission, onward)  Start     Dose/Rate Route Frequency Ordered Stop   12/21/23 1600  meropenem (MERREM) 1 g in sodium chloride  0.9 % 100 mL IVPB        1 g 200 mL/hr over 30 Minutes Intravenous Every 24 hours 12/21/23 0853     12/19/23 1045  ceFEPIme (MAXIPIME) 2 g in sodium chloride  0.9 % 100 mL IVPB  Status:  Discontinued        2 g 200 mL/hr over 30 Minutes Intravenous Every 12 hours 12/19/23 0955 12/21/23 0843   12/19/23 1045  metroNIDAZOLE  (FLAGYL ) IVPB 500 mg  Status:  Discontinued        500 mg 100 mL/hr over 60 Minutes Intravenous Every 12 hours 12/19/23 0955 12/21/23 0843   12/17/23 1600  piperacillin -tazobactam (ZOSYN ) IVPB 2.25 g  Status:  Discontinued        2.25 g 100 mL/hr over 30 Minutes Intravenous Every 8 hours 12/17/23 0931 12/17/23 1144   12/17/23 1600  piperacillin -tazobactam (ZOSYN ) IVPB 3.375 g  Status:  Discontinued        3.375 g 100 mL/hr over 30 Minutes Intravenous Every 6 hours 12/17/23 1157 12/19/23 0955   12/17/23 1230  piperacillin -tazobactam (ZOSYN ) IVPB 3.375 g  Status:  Discontinued        3.375 g 100 mL/hr over 30 Minutes Intravenous Every 6 hours 12/17/23 1144 12/17/23 1157   12/17/23 1000  linezolid  (ZYVOX ) IVPB 600 mg  Status:  Discontinued        600 mg 300 mL/hr over 60 Minutes Intravenous Every 12 hours 12/16/23 1924 12/19/23 0958   12/17/23 0000  piperacillin -tazobactam (ZOSYN ) IVPB 3.375 g  Status:  Discontinued        3.375 g 12.5 mL/hr over 240 Minutes Intravenous Every 8 hours 12/16/23 1924 12/17/23 0931   12/16/23 1600  piperacillin -tazobactam (ZOSYN ) IVPB 3.375 g        3.375 g 12.5 mL/hr over 240 Minutes Intravenous  Once 12/16/23 1551 12/16/23 1602   12/16/23 1600  vancomycin  (VANCOREADY) IVPB 1750 mg/350 mL        1,750 mg 175 mL/hr over 120 Minutes Intravenous  Once 12/16/23 1551 12/16/23 1816        Assessment/Plan: Glenn Stewart is an 58 y.o. male with CAD (s/p CABG), afib, HTN, HLD, arrhythmias here following radical prostatectomy and lymph node dissection 11/17/2023 for prostate cancer - Con't with likely ileus on KUB, will add supp  - fluid collections they do track all the way down into the deep pelvis along the psoas muscle which would be a rather atypical location for any sort of fluid collections originating from a bowel source although not impossible. Wall thickening of the ascending colon/flexure noted, favor reactive changes.  - CT cystogram shows contained leak at the anterior anastomosis; read Dr. Hosea note from 9/30; continue foley 7-10 days.  - s/p IR drainage of RLQ/pelvic collection 9/27 (Cx abundant staph); 300 mL - drain creatinine consistent with urinoma/bladder leak; foley is in place per urology  - Broad-spectrum IV antibiotics as per critical care medicine   FEN: NG to LIWS and await bowel function ID: Zosyn , zyvox  VTE: SCD's, SQH Dispo: ICU  LOS: 6 days    Lynda Leos 12/22/2023

## 2023-12-22 NOTE — Progress Notes (Addendum)
 PHARMACY - TOTAL PARENTERAL NUTRITION CONSULT NOTE   Indication: Prolonged ileus  Patient Measurements: Height: 5' 5 (165.1 cm) Weight: 97.8 kg (215 lb 9.8 oz) IBW/kg (Calculated) : 61.5   Body mass index is 35.88 kg/m. Usual Weight: 96 kg  Assessment:  58 yo M with recent radial prostatectomy on 11/17/23 found to have complicated intra-abdominal infection. He underwent drain placement with IR on 12/17/23 into one of many fluid collections in the abdomen. The patient's course has been complicated by ileus as visualized on imaging and is now Day 6 of NPO status. Pharmacy consulted for TPN in this context.   Glucose / Insulin : A1c 6.0 - CBG<150 no SSI use Electrolytes: Na 132, K 5.2, CoCa 9.7, Chloride 96  Renal: iHD last session 10/1, plan for another 10/2 ; BUN 77  Hepatic: LFTs WNL, Alb<1.5; recent high triglyceride d/t propofol  use: 785 (9/30) Intake / Output; MIVF: UO 0.1 ml/kg/hr, NGT 370 mL, drain 130 mL ; 1.7 L removed from iHD  GI Imaging: 10/2 KUB : unchanged distention, ileus  GI Surgeries / Procedures: As above, none since TPN start  Central access: CVC TPN start date: 12/22/23  Nutritional Goals: Goal TPN rate is 65 mL/hr (provides 115 g of protein, 0g lipid, and 452g CHO for a total 2000 kcals per day) without lipids  Goal TPN rate is 70 mL/hr (provides 115 g of protein, 60g lipid, and 276g CHO for a total 2000 kcals per day) with lipids  RD Assessment: Estimated Needs Total Energy Estimated Needs: 2000-2200 kcal/d Total Protein Estimated Needs: 110-125g/d Total Fluid Estimated Needs: 2L/d  Current Nutrition:  NPO, TPN  Plan:  Start TPN at 35 mL/hr at 1800 to provide ~50% of needs on Day 1 of TPN --remove lipids with recent documented hyperTG 2/2 propofol  usage. Pt has been off propofol  since 9/30 AM will f/u triglycerides in AM for adjustments to TPN Electrolytes in TPN: Na 75 mEq/L, K 0 mEq/L, Ca 5 mEq/L, Mg 5 mEq/L, and Phos 0 mmol/L. Cl:Ace Max Chloride  (comes to 1.2:1 ratio) Add standard MVI and trace elements to TPN Initiate Sensitive q4h SSI and adjust as needed  Monitor TPN labs on Mon/Thurs, daily until stable Daily triglyceride levels x5 days  Sharyne Glatter, PharmD, BCCCP Critical Care Clinical Pharmacist 12/22/2023 10:20 AM

## 2023-12-23 ENCOUNTER — Inpatient Hospital Stay (HOSPITAL_COMMUNITY)

## 2023-12-23 DIAGNOSIS — R000 Tachycardia, unspecified: Secondary | ICD-10-CM | POA: Diagnosis not present

## 2023-12-23 DIAGNOSIS — I4891 Unspecified atrial fibrillation: Secondary | ICD-10-CM | POA: Diagnosis not present

## 2023-12-23 DIAGNOSIS — A419 Sepsis, unspecified organism: Secondary | ICD-10-CM | POA: Diagnosis not present

## 2023-12-23 DIAGNOSIS — R579 Shock, unspecified: Secondary | ICD-10-CM | POA: Diagnosis not present

## 2023-12-23 DIAGNOSIS — K567 Ileus, unspecified: Secondary | ICD-10-CM | POA: Diagnosis not present

## 2023-12-23 DIAGNOSIS — R6521 Severe sepsis with septic shock: Secondary | ICD-10-CM | POA: Diagnosis not present

## 2023-12-23 HISTORY — PX: IR CV LINE INJECTION: IMG2294

## 2023-12-23 LAB — CBC
HCT: 35.1 % — ABNORMAL LOW (ref 39.0–52.0)
Hemoglobin: 11.3 g/dL — ABNORMAL LOW (ref 13.0–17.0)
MCH: 26.2 pg (ref 26.0–34.0)
MCHC: 32.2 g/dL (ref 30.0–36.0)
MCV: 81.4 fL (ref 80.0–100.0)
Platelets: 359 K/uL (ref 150–400)
RBC: 4.31 MIL/uL (ref 4.22–5.81)
RDW: 15.5 % (ref 11.5–15.5)
WBC: 11 K/uL — ABNORMAL HIGH (ref 4.0–10.5)
nRBC: 0 % (ref 0.0–0.2)

## 2023-12-23 LAB — POCT I-STAT 7, (LYTES, BLD GAS, ICA,H+H)
Acid-base deficit: 1 mmol/L (ref 0.0–2.0)
Bicarbonate: 24.7 mmol/L (ref 20.0–28.0)
Calcium, Ion: 1.01 mmol/L — ABNORMAL LOW (ref 1.15–1.40)
HCT: 31 % — ABNORMAL LOW (ref 39.0–52.0)
Hemoglobin: 10.5 g/dL — ABNORMAL LOW (ref 13.0–17.0)
O2 Saturation: 97 %
Patient temperature: 98.4
Potassium: 4.2 mmol/L (ref 3.5–5.1)
Sodium: 134 mmol/L — ABNORMAL LOW (ref 135–145)
TCO2: 26 mmol/L (ref 22–32)
pCO2 arterial: 42.3 mmHg (ref 32–48)
pH, Arterial: 7.374 (ref 7.35–7.45)
pO2, Arterial: 93 mmHg (ref 83–108)

## 2023-12-23 LAB — LEGIONELLA PNEUMOPHILA SEROGP 1 UR AG: L. pneumophila Serogp 1 Ur Ag: NEGATIVE

## 2023-12-23 LAB — RENAL FUNCTION PANEL
Albumin: 1.6 g/dL — ABNORMAL LOW (ref 3.5–5.0)
Anion gap: 15 (ref 5–15)
BUN: 75 mg/dL — ABNORMAL HIGH (ref 6–20)
CO2: 23 mmol/L (ref 22–32)
Calcium: 7.7 mg/dL — ABNORMAL LOW (ref 8.9–10.3)
Chloride: 95 mmol/L — ABNORMAL LOW (ref 98–111)
Creatinine, Ser: 4.25 mg/dL — ABNORMAL HIGH (ref 0.61–1.24)
GFR, Estimated: 15 mL/min — ABNORMAL LOW (ref >60)
Glucose, Bld: 161 mg/dL — ABNORMAL HIGH (ref 70–99)
Phosphorus: 6.9 mg/dL — ABNORMAL HIGH (ref 2.5–4.6)
Potassium: 4.4 mmol/L (ref 3.5–5.1)
Sodium: 133 mmol/L — ABNORMAL LOW (ref 135–145)

## 2023-12-23 LAB — GLUCOSE, CAPILLARY
Glucose-Capillary: 122 mg/dL — ABNORMAL HIGH (ref 70–99)
Glucose-Capillary: 132 mg/dL — ABNORMAL HIGH (ref 70–99)
Glucose-Capillary: 155 mg/dL — ABNORMAL HIGH (ref 70–99)
Glucose-Capillary: 158 mg/dL — ABNORMAL HIGH (ref 70–99)
Glucose-Capillary: 177 mg/dL — ABNORMAL HIGH (ref 70–99)
Glucose-Capillary: 188 mg/dL — ABNORMAL HIGH (ref 70–99)

## 2023-12-23 LAB — MAGNESIUM: Magnesium: 2.4 mg/dL (ref 1.7–2.4)

## 2023-12-23 LAB — TRIGLYCERIDES: Triglycerides: 269 mg/dL — ABNORMAL HIGH (ref <150)

## 2023-12-23 MED ORDER — IOHEXOL 300 MG/ML  SOLN
50.0000 mL | Freq: Once | INTRAMUSCULAR | Status: AC | PRN
  Administered 2023-12-23: 15 mL

## 2023-12-23 MED ORDER — CHLORHEXIDINE GLUCONATE CLOTH 2 % EX PADS: 6.0000 | MEDICATED_PAD | Freq: Every day | CUTANEOUS | Status: DC

## 2023-12-23 MED ORDER — VITAL 1.5 CAL PO LIQD
1000.0000 mL | ORAL | Status: DC
  Administered 2023-12-23: 1000 mL

## 2023-12-23 MED ORDER — HALOPERIDOL LACTATE 5 MG/ML IJ SOLN
5.0000 mg | Freq: Once | INTRAMUSCULAR | Status: AC
  Administered 2023-12-23: 5 mg via INTRAVENOUS
  Filled 2023-12-23: qty 1

## 2023-12-23 MED ORDER — PROSOURCE TF20 ENFIT COMPATIBL EN LIQD
60.0000 mL | Freq: Every day | ENTERAL | Status: DC
  Administered 2023-12-23: 60 mL
  Filled 2023-12-23: qty 60

## 2023-12-23 MED ORDER — TRACE MINERALS CU-MN-SE-ZN 300-55-60-3000 MCG/ML IV SOLN
INTRAVENOUS | Status: DC
  Filled 2023-12-23: qty 766.47

## 2023-12-23 MED ORDER — GERHARDT'S BUTT CREAM
TOPICAL_CREAM | Freq: Two times a day (BID) | CUTANEOUS | Status: DC
  Filled 2023-12-23 (×2): qty 60

## 2023-12-23 NOTE — Progress Notes (Signed)
 Rounding Note   Patient Name: Glenn Stewart Date of Encounter: 12/23/2023  [facility] Cardiologist: Redell Shallow, MD   Subjective  Pt remains intubated. Telemetry reviewed and shows about 10 sec of what appears to be monomorphic VT.  Scheduled Meds:  amiodarone   150 mg Intravenous Once   bisacodyl   10 mg Rectal Daily   Chlorhexidine  Gluconate Cloth  6 each Topical Daily   diatrizoate meglumine-sodium  90 mL Per Tube Once   feeding supplement (PROSource TF20)  60 mL Per Tube Daily   heparin  injection (subcutaneous)  5,000 Units Subcutaneous Q8H   insulin  aspart  0-9 Units Subcutaneous Q4H   metoCLOPramide (REGLAN) injection  5 mg Intravenous Q6H   mouth rinse  15 mL Mouth Rinse Q2H   pantoprazole  (PROTONIX ) IV  40 mg Intravenous Daily   sodium chloride  flush  5 mL Intracatheter Q8H   Continuous Infusions:  amiodarone  30 mg/hr (12/23/23 0400)   dexmedetomidine  (PRECEDEX ) IV infusion 1.2 mcg/kg/hr (12/23/23 0400)   feeding supplement (VITAL 1.5 CAL) 1,000 mL (12/23/23 1003)   fentaNYL  infusion INTRAVENOUS Stopped (12/20/23 1415)   ketamine  (KETALAR ) adult infusion 0.5 mg/kg/hr (12/23/23 0400)   meropenem (MERREM) IV 1 g (12/22/23 1521)   TPN ADULT (ION) 35 mL/hr at 12/23/23 0400   PRN Meds: HYDROmorphone  (DILAUDID ) injection, HYDROmorphone  (DILAUDID ) injection, mouth rinse   Vital Signs  Vitals:   12/23/23 0530 12/23/23 0600 12/23/23 0630 12/23/23 0752  BP: 113/72 116/73    Pulse: 68 67 75   Resp: 20 20 19    Temp: 98.2 F (36.8 C) 98.2 F (36.8 C) 97.9 F (36.6 C)   TempSrc:    Bladder  SpO2: 96% 97% 96%   Weight:      Height:        Intake/Output Summary (Last 24 hours) at 12/23/2023 1024 Last data filed at 12/23/2023 0428 Gross per 24 hour  Intake 1692.88 ml  Output 2463 ml  Net -770.12 ml      12/23/2023    5:00 AM 12/22/2023    5:00 AM 12/21/2023    6:01 PM  Last 3 Weights  Weight (lbs) 213 lb 6.5 oz 215 lb 9.8 oz 211 lb 13.8 oz   Weight (kg) 96.8 kg 97.8 kg 96.1 kg      Telemetry SR --> 10 sec / 21 beast of VT at 0821 on 12/23/23 -> ST in the 120s --> SR in the 80s - Personally Reviewed  ECG  No new tracings - Personally Reviewed  Physical Exam  GEN: intubated.   Cardiac: RRR Respiratory: intubated GI: Soft  MS: No edema; No deformity. Neuro:  Nonfocal  Psych: Normal affect   Labs High Sensitivity Troponin:   Recent Labs  Lab 12/16/23 1524 12/16/23 1839  TROPONINIHS 82* 95*     Chemistry Recent Labs  Lab 12/20/23 0414 12/20/23 0848 12/21/23 0432 12/21/23 1200 12/21/23 1829 12/22/23 0359 12/22/23 1527 12/23/23 0638  NA 131*   < > 132*  --    < > 132* 131* 133*  K 4.5   < > 5.6*  --    < > 5.2* 5.4* 4.4  CL 97*   < > 101  --    < > 96* 92* 95*  CO2 21*   < > 20*  --    < > 22 20* 23  GLUCOSE 104*   < > 132*  --    < > 123* 128* 161*  BUN 45*   < > 77*  --    < >  77* 103* 75*  CREATININE 2.61*   < > 3.75*  --    < > 4.29* 5.21* 4.25*  CALCIUM  7.7*   < > 7.7*  --    < > 7.6* 7.5* 7.7*  MG 2.8*  --  2.8*  --   --  2.4  --  2.4  PROT 6.2*  --   --  6.1*  --  6.0*  --   --   ALBUMIN  <1.5*  <1.5*   < > <1.5* <1.5*   < > <1.5*  <1.5* <1.5* 1.6*  AST 44*  --   --  38  --  36  --   --   ALT 27  --   --  22  --  25  --   --   ALKPHOS 122  --   --  112  --  97  --   --   BILITOT 1.7*  --   --  1.3*  --  1.0  --   --   GFRNONAA 27*   < > 18*  --    < > 15* 12* 15*  ANIONGAP 13   < > 11  --    < > 14 19* 15   < > = values in this interval not displayed.    Lipids  Recent Labs  Lab 12/23/23 0638  TRIG 269*    Hematology Recent Labs  Lab 12/21/23 0432 12/22/23 0359 12/23/23 0638  WBC 16.9* 13.7* 11.0*  RBC 3.93* 3.97* 4.31  HGB 10.5* 10.6* 11.3*  HCT 32.0* 31.8* 35.1*  MCV 81.4 80.1 81.4  MCH 26.7 26.7 26.2  MCHC 32.8 33.3 32.2  RDW 15.8* 15.4 15.5  PLT 310 338 359   Thyroid No results for input(s): TSH, FREET4 in the last 168 hours.  BNPNo results for input(s): BNP,  PROBNP in the last 168 hours.  DDimer No results for input(s): DDIMER in the last 168 hours.   Radiology  DG Abd Portable 1V Result Date: 12/22/2023 CLINICAL DATA:  Ileus. EXAM: PORTABLE ABDOMEN - 1 VIEW COMPARISON:  Most recent radiograph 12/20/2023, CT 12/21/2023 FINDINGS: Unchanged gaseous small bowel distention in the central abdomen. There is also air-filled transverse colon. Tip and side port of the enteric tube below the diaphragm in the stomach. Right lower quadrant drain remains in place. No evidence of free air. IMPRESSION: Unchanged gaseous small bowel in transverse colonic distention in the central abdomen, favoring ileus. Electronically Signed   By: Andrea Gasman M.D.   On: 12/22/2023 11:32   DG CHEST PORT 1 VIEW Result Date: 12/21/2023 EXAM: 1 VIEW(S) XRAY OF THE CHEST 12/21/2023 05:57:00 PM COMPARISON: 12/19/2023 CLINICAL HISTORY: Dyspnea FINDINGS: LINES, TUBES AND DEVICES: Endotracheal tube in place with tip 2.1 cm above carina. Enteric tube with tip below the diaphragm and sidehole overlying the stomach. Right internal jugular central venous catheter in place with tip over right atrium. Left internal jugular central venous catheter in place with tip over brachiocephalic vein. LUNGS AND PLEURA: Low lung volumes. Bibasilar patchy opacities. Small left pleural effusion. No pneumothorax. No pulmonary edema. HEART AND MEDIASTINUM: Median sternotomy wires and mediastinal clips noted. No acute abnormality of the cardiac and mediastinal silhouettes. BONES AND SOFT TISSUES: No acute osseous abnormality. IMPRESSION: 1. Low lung volumes and bibasilar patchy opacities. 2. Small left pleural effusion. 3. Support devices as above. Electronically signed by: Donnice Mania MD 12/21/2023 07:14 PM EDT RP Workstation: [id number]   CT ABDOMEN PELVIS W CONTRAST Result Date:  12/21/2023 CLINICAL DATA:  Intraabdominal abscess EXAM: CT ABDOMEN AND PELVIS WITH CONTRAST TECHNIQUE: Multidetector CT imaging of  the abdomen and pelvis was performed using the standard protocol following bolus administration of intravenous contrast. RADIATION DOSE REDUCTION: This exam was performed according to the departmental dose-optimization program which includes automated exposure control, adjustment of the mA and/or kV according to patient size and/or use of iterative reconstruction technique. CONTRAST:  75mL OMNIPAQUE  IOHEXOL  350 MG/ML SOLN COMPARISON:  12/20/2023, 12/17/2023, 12/16/2023 FINDINGS: Lower chest: No focal airspace consolidation or pleural effusion.Posterior bibasilar dependent atelectasis. Dense multi-vessel coronary atherosclerosis. Sternotomy wires and postsurgical changes of a prior CABG. Hepatobiliary: No mass.Decompressed gallbladder containing a few small radiopaque stones. Apparent enhancing soft tissue in the gallbladder fundus, may represent normal decompressed gallbladder wall in close approximation, or focal adenomyomatosis. No intrahepatic or extrahepatic biliary ductal dilation. The portal veins are patent. Pancreas: No mass or main ductal dilation. No peripancreatic inflammation or fluid collection. Spleen: Normal size. No mass. Adrenals/Urinary Tract: Similar multifocal nodularity in the left adrenal gland, with the largest nodule measuring 1.9 cm. There is also likely a small adrenal nodule in the medial right adrenal gland (axial 37). No renal mass. No nephrolithiasis or hydronephrosis. The urinary bladder is completely decompressed with a low lying urinary catheter in place. Stomach/Bowel: Esophagogastric tube terminates in the stomach, well-positioned. Enteric contrast present within the stomach and proximal small bowel. While a couple of segments of small bowel in the left upper quadrant are dilated, no abrupt transition point present to suggest small bowel obstruction. Fluid present within the proximal colon.Normal appendix. Sigmoid colonic diverticulosis. No changes of acute diverticulitis.  Vascular/Lymphatic: No aortic aneurysm. No intraabdominal or pelvic lymphadenopathy. Reproductive: Prostatectomy.No free pelvic fluid. Other: No pneumoperitoneum. Similar diffuse mesenteric stranding with perinephric and retroperitoneal stranding, more pronounced on the right. Interval decompression of the right retroperitoneal collection, measuring 2.8 x 7.7 cm (previously 5.6 x 10.6 cm, by my measurement), with a similarly well positioned percutaneous pigtail drainage catheter in the lower portion of the collection. Multiple small low-density fluid collections along the right lateral omentum and right paracolic gutter, measuring up to 2.4 x 4.1 cm (axial 56). A small fluid collection along the ventral right abdominal wall (axial 73), is unchanged measuring 1.9 x 7.1 cm. The irregular fluid collection in the right lower quadrant extending to the midline ventral abdominal wall, measures 6.5 x 14.4 x 13.3 cm (APxTRxCC), axial 80. Right lower quadrant, ileocolic mesentery collection appears similar measuring 4.2 x 4.4 cm (axial 56). The fluid collections along the pelvic sidewall are improving in the interim. For example, the right sided collection measures 5.6 x 2.5 cm (previously 6.5 x 4.7 cm, axial 93). Musculoskeletal: No acute fracture or destructive lesion. Multilevel degenerative disc disease of the spine. IMPRESSION: 1. Interval decompression of the right retroperitoneal fluid collection, measuring 2.8 x 7.7 cm (previously, 5.6 x 10.6 cm, by my measurement). The pigtail percutaneous drainage catheter is otherwise similarly well-positioned in the fluid collection. 2. irregular fluid collection in the right lower quadrant extending to the midline ventral abdominal wall, measures 6.5 x 14.4 x 13.3 cm (axial 80), unchanged. 3. The bilateral pelvic sidewall fluid collections have decreased in size in the interim, measuring 5.6 x 2.5 cm on the right (previously, 6.5 x 4.7 cm). 4. Multiple small additional fluid  collections in the right ileocolic mesentery, along the right paracolic gutter, and the right lower quadrant ventral abdominal wall are unchanged, as described above. 5. Similarly positioned low-lying catheter in  the urinary bladder, which may be expected positioning status post prostatectomy. 6. Well-positioned esophagogastric tube terminating in the stomach. While a couple of segments of small bowel in the upper abdomen are distended, no abrupt transition point present to suggest small bowel obstruction. Aortic Atherosclerosis (ICD10-I70.0). Electronically Signed   By: Rogelia Myers M.D.   On: 12/21/2023 12:55    Cardiac Studies  Echo 12/17/23:  1. Left ventricular ejection fraction, by estimation, is 50 to 55%. The  left ventricle has low normal function. The left ventricle has no regional  wall motion abnormalities. Left ventricular diastolic parameters were  normal.   2. Right ventricular systolic function is normal. The right ventricular  size is mildly enlarged. Tricuspid regurgitation signal is inadequate for  assessing PA pressure.   3. The mitral valve is normal in structure. No evidence of mitral valve  regurgitation. No evidence of mitral stenosis.   4. The aortic valve is normal in structure. Aortic valve regurgitation is  not visualized. No aortic stenosis is present.   5. The inferior vena cava is normal in size with greater than 50%  respiratory variability, suggesting right atrial pressure of 3 mmHg.   Patient Profile    58 y.o. male   Assessment & Plan   Wide complex tachycardia - favor VT - had sustained VT in the ER 12/16/23 requiring emergent cardioversion with IV Mg and IV amiodarone  --> resulting in Afib vs flutter - ultimately intubated and cardiology asked to evaluate for WCT - he remains on IV amiodarone , intubated/ventilated - WCT has been quiescent until this morning at 0821 with 21 beats / ~10 sec of WCT felt likely VT - continue IV amiodarone  - hold off  on BB - electrolytes adequate - will add on TSH for completeness, but likely will be abnormal given clinical condition - once he has improved clinically, will discuss further  workup   CAD s/p CABG x 5 in 04/2022 Elevated troponin  - trop 82 --> 95 - not able to gather symptoms - will likely need ischemic eval once recovered clinically   PAF - continuing amiodarone  as above - given brief nature of WCT, suspect VT rather than Afib with RVR   AKI - nephrology following Urinoma, abdominal abscess - urology following after recent radical prostatectomy Septic shock - per PCCM       For questions or updates, please contact [facility] Please consult [email or web address] for contact info under       Signed, Jon Nat Hails, PA  12/23/2023, 10:24 AM

## 2023-12-23 NOTE — Plan of Care (Signed)
  Problem: Education: Goal: Knowledge of the procedure and recovery process will improve Outcome: Progressing   Problem: Bowel/Gastric: Goal: Gastrointestinal status for postoperative course will improve Outcome: Progressing   Problem: Pain Management: Goal: General experience of comfort will improve Outcome: Progressing   Problem: Urinary Elimination: Goal: Ability to avoid or minimize complications of infection will improve Outcome: Progressing Goal: Ability to achieve and maintain urine output will improve Outcome: Progressing   Problem: Education: Goal: Knowledge of General Education information will improve Description: Including pain rating scale, medication(s)/side effects and non-pharmacologic comfort measures Outcome: Progressing   Problem: Health Behavior/Discharge Planning: Goal: Ability to manage health-related needs will improve Outcome: Progressing

## 2023-12-23 NOTE — Progress Notes (Signed)
 Subjective/Chief Complaint: Pt with no acute changes +BMs   Objective: Vital signs in last 24 hours: Temp:  [97.2 F (36.2 C)-99.7 F (37.6 C)] 97.9 F (36.6 C) (10/03 0630) Pulse Rate:  [67-90] 75 (10/03 0630) Resp:  [18-30] 19 (10/03 0630) BP: (104-163)/(64-92) 116/73 (10/03 0600) SpO2:  [93 %-100 %] 96 % (10/03 0630) FiO2 (%):  [40 %] 40 % (10/03 0752) Weight:  [96.8 kg] 96.8 kg (10/03 0500) Last BM Date : 12/22/23  Intake/Output from previous day: 10/02 0701 - 10/03 0700 In: 1846.3 [I.V.:1446.3; IV Piggyback:400] Out: 2643 [Urine:138; Emesis/NG output:380; Drains:225] Intake/Output this shift: No intake/output data recorded.  PE: Gen:  acutely ill appearing Card:  Regular rate and rhythm Pulm:  ventilated respirations Abd: soft, min distention - stable, NTTP, no rebound tenderness, drain to gravity appears serous Skin: warm and dry, no rashes Psych: unable to assess  Lab Results:  Recent Labs    12/22/23 0359 12/23/23 0638  WBC 13.7* 11.0*  HGB 10.6* 11.3*  HCT 31.8* 35.1*  PLT 338 359   BMET Recent Labs    12/22/23 1527 12/23/23 0638  NA 131* 133*  K 5.4* 4.4  CL 92* 95*  CO2 20* 23  GLUCOSE 128* 161*  BUN 103* 75*  CREATININE 5.21* 4.25*  CALCIUM  7.5* 7.7*   PT/INR No results for input(s): LABPROT, INR in the last 72 hours. ABG No results for input(s): PHART, HCO3 in the last 72 hours.  Invalid input(s): PCO2, PO2  Studies/Results: DG Abd Portable 1V Result Date: 12/22/2023 CLINICAL DATA:  Ileus. EXAM: PORTABLE ABDOMEN - 1 VIEW COMPARISON:  Most recent radiograph 12/20/2023, CT 12/21/2023 FINDINGS: Unchanged gaseous small bowel distention in the central abdomen. There is also air-filled transverse colon. Tip and side port of the enteric tube below the diaphragm in the stomach. Right lower quadrant drain remains in place. No evidence of free air. IMPRESSION: Unchanged gaseous small bowel in transverse colonic distention in the  central abdomen, favoring ileus. Electronically Signed   By: Andrea Gasman M.D.   On: 12/22/2023 11:32   DG CHEST PORT 1 VIEW Result Date: 12/21/2023 EXAM: 1 VIEW(S) XRAY OF THE CHEST 12/21/2023 05:57:00 PM COMPARISON: 12/19/2023 CLINICAL HISTORY: Dyspnea FINDINGS: LINES, TUBES AND DEVICES: Endotracheal tube in place with tip 2.1 cm above carina. Enteric tube with tip below the diaphragm and sidehole overlying the stomach. Right internal jugular central venous catheter in place with tip over right atrium. Left internal jugular central venous catheter in place with tip over brachiocephalic vein. LUNGS AND PLEURA: Low lung volumes. Bibasilar patchy opacities. Small left pleural effusion. No pneumothorax. No pulmonary edema. HEART AND MEDIASTINUM: Median sternotomy wires and mediastinal clips noted. No acute abnormality of the cardiac and mediastinal silhouettes. BONES AND SOFT TISSUES: No acute osseous abnormality. IMPRESSION: 1. Low lung volumes and bibasilar patchy opacities. 2. Small left pleural effusion. 3. Support devices as above. Electronically signed by: Donnice Mania MD 12/21/2023 07:14 PM EDT RP Workstation: [id number]   CT ABDOMEN PELVIS W CONTRAST Result Date: 12/21/2023 CLINICAL DATA:  Intraabdominal abscess EXAM: CT ABDOMEN AND PELVIS WITH CONTRAST TECHNIQUE: Multidetector CT imaging of the abdomen and pelvis was performed using the standard protocol following bolus administration of intravenous contrast. RADIATION DOSE REDUCTION: This exam was performed according to the departmental dose-optimization program which includes automated exposure control, adjustment of the mA and/or kV according to patient size and/or use of iterative reconstruction technique. CONTRAST:  75mL OMNIPAQUE  IOHEXOL  350 MG/ML SOLN COMPARISON:  12/20/2023, 12/17/2023, 12/16/2023  FINDINGS: Lower chest: No focal airspace consolidation or pleural effusion.Posterior bibasilar dependent atelectasis. Dense multi-vessel coronary  atherosclerosis. Sternotomy wires and postsurgical changes of a prior CABG. Hepatobiliary: No mass.Decompressed gallbladder containing a few small radiopaque stones. Apparent enhancing soft tissue in the gallbladder fundus, may represent normal decompressed gallbladder wall in close approximation, or focal adenomyomatosis. No intrahepatic or extrahepatic biliary ductal dilation. The portal veins are patent. Pancreas: No mass or main ductal dilation. No peripancreatic inflammation or fluid collection. Spleen: Normal size. No mass. Adrenals/Urinary Tract: Similar multifocal nodularity in the left adrenal gland, with the largest nodule measuring 1.9 cm. There is also likely a small adrenal nodule in the medial right adrenal gland (axial 37). No renal mass. No nephrolithiasis or hydronephrosis. The urinary bladder is completely decompressed with a low lying urinary catheter in place. Stomach/Bowel: Esophagogastric tube terminates in the stomach, well-positioned. Enteric contrast present within the stomach and proximal small bowel. While a couple of segments of small bowel in the left upper quadrant are dilated, no abrupt transition point present to suggest small bowel obstruction. Fluid present within the proximal colon.Normal appendix. Sigmoid colonic diverticulosis. No changes of acute diverticulitis. Vascular/Lymphatic: No aortic aneurysm. No intraabdominal or pelvic lymphadenopathy. Reproductive: Prostatectomy.No free pelvic fluid. Other: No pneumoperitoneum. Similar diffuse mesenteric stranding with perinephric and retroperitoneal stranding, more pronounced on the right. Interval decompression of the right retroperitoneal collection, measuring 2.8 x 7.7 cm (previously 5.6 x 10.6 cm, by my measurement), with a similarly well positioned percutaneous pigtail drainage catheter in the lower portion of the collection. Multiple small low-density fluid collections along the right lateral omentum and right paracolic gutter,  measuring up to 2.4 x 4.1 cm (axial 56). A small fluid collection along the ventral right abdominal wall (axial 73), is unchanged measuring 1.9 x 7.1 cm. The irregular fluid collection in the right lower quadrant extending to the midline ventral abdominal wall, measures 6.5 x 14.4 x 13.3 cm (APxTRxCC), axial 80. Right lower quadrant, ileocolic mesentery collection appears similar measuring 4.2 x 4.4 cm (axial 56). The fluid collections along the pelvic sidewall are improving in the interim. For example, the right sided collection measures 5.6 x 2.5 cm (previously 6.5 x 4.7 cm, axial 93). Musculoskeletal: No acute fracture or destructive lesion. Multilevel degenerative disc disease of the spine. IMPRESSION: 1. Interval decompression of the right retroperitoneal fluid collection, measuring 2.8 x 7.7 cm (previously, 5.6 x 10.6 cm, by my measurement). The pigtail percutaneous drainage catheter is otherwise similarly well-positioned in the fluid collection. 2. irregular fluid collection in the right lower quadrant extending to the midline ventral abdominal wall, measures 6.5 x 14.4 x 13.3 cm (axial 80), unchanged. 3. The bilateral pelvic sidewall fluid collections have decreased in size in the interim, measuring 5.6 x 2.5 cm on the right (previously, 6.5 x 4.7 cm). 4. Multiple small additional fluid collections in the right ileocolic mesentery, along the right paracolic gutter, and the right lower quadrant ventral abdominal wall are unchanged, as described above. 5. Similarly positioned low-lying catheter in the urinary bladder, which may be expected positioning status post prostatectomy. 6. Well-positioned esophagogastric tube terminating in the stomach. While a couple of segments of small bowel in the upper abdomen are distended, no abrupt transition point present to suggest small bowel obstruction. Aortic Atherosclerosis (ICD10-I70.0). Electronically Signed   By: Rogelia Myers M.D.   On: 12/21/2023 12:55     Anti-infectives: Anti-infectives (From admission, onward)    Start     Dose/Rate Route Frequency Ordered Stop  12/21/23 1600  meropenem (MERREM) 1 g in sodium chloride  0.9 % 100 mL IVPB        1 g 200 mL/hr over 30 Minutes Intravenous Every 24 hours 12/21/23 0853     12/19/23 1045  ceFEPIme (MAXIPIME) 2 g in sodium chloride  0.9 % 100 mL IVPB  Status:  Discontinued        2 g 200 mL/hr over 30 Minutes Intravenous Every 12 hours 12/19/23 0955 12/21/23 0843   12/19/23 1045  metroNIDAZOLE  (FLAGYL ) IVPB 500 mg  Status:  Discontinued        500 mg 100 mL/hr over 60 Minutes Intravenous Every 12 hours 12/19/23 0955 12/21/23 0843   12/17/23 1600  piperacillin -tazobactam (ZOSYN ) IVPB 2.25 g  Status:  Discontinued        2.25 g 100 mL/hr over 30 Minutes Intravenous Every 8 hours 12/17/23 0931 12/17/23 1144   12/17/23 1600  piperacillin -tazobactam (ZOSYN ) IVPB 3.375 g  Status:  Discontinued        3.375 g 100 mL/hr over 30 Minutes Intravenous Every 6 hours 12/17/23 1157 12/19/23 0955   12/17/23 1230  piperacillin -tazobactam (ZOSYN ) IVPB 3.375 g  Status:  Discontinued        3.375 g 100 mL/hr over 30 Minutes Intravenous Every 6 hours 12/17/23 1144 12/17/23 1157   12/17/23 1000  linezolid  (ZYVOX ) IVPB 600 mg  Status:  Discontinued        600 mg 300 mL/hr over 60 Minutes Intravenous Every 12 hours 12/16/23 1924 12/19/23 0958   12/17/23 0000  piperacillin -tazobactam (ZOSYN ) IVPB 3.375 g  Status:  Discontinued        3.375 g 12.5 mL/hr over 240 Minutes Intravenous Every 8 hours 12/16/23 1924 12/17/23 0931   12/16/23 1600  piperacillin -tazobactam (ZOSYN ) IVPB 3.375 g        3.375 g 12.5 mL/hr over 240 Minutes Intravenous  Once 12/16/23 1551 12/16/23 1602   12/16/23 1600  vancomycin  (VANCOREADY) IVPB 1750 mg/350 mL        1,750 mg 175 mL/hr over 120 Minutes Intravenous  Once 12/16/23 1551 12/16/23 1816       Assessment/Plan: Glenn Stewart is an 58 y.o. male with CAD (s/p CABG), afib,  HTN, HLD, arrhythmias here following radical prostatectomy and lymph node dissection 11/17/2023 for prostate cancer - ileus resolving - fluid collections they do track all the way down into the deep pelvis along the psoas muscle which would be a rather atypical location for any sort of fluid collections originating from a bowel source although not impossible. Wall thickening of the ascending colon/flexure noted, favor reactive changes.  - CT cystogram shows contained leak at the anterior anastomosis; read Dr. Hosea note from 9/30; continue foley 7-10 days.  - s/p IR drainage of RLQ/pelvic collection 9/27 (Cx abundant staph); 300 mL - drain creatinine consistent with urinoma/bladder leak; foley is in place per urology  - Broad-spectrum IV antibiotics as per critical care medicine   FEN: Ok for trickle TFs and can increase slowly to goal ID: Zosyn , [email or web address]  VTE: SCD's, SQH Dispo: ICU  LOS: 7 days    Lynda Leos 12/23/2023

## 2023-12-23 NOTE — Progress Notes (Signed)
 PHARMACY - TOTAL PARENTERAL NUTRITION CONSULT NOTE   Indication: Prolonged ileus  Patient Measurements: Height: 5' 5 (165.1 cm) Weight: 96.8 kg (213 lb 6.5 oz) IBW/kg (Calculated) : 61.5   Body mass index is 35.51 kg/m. Usual Weight: 96 kg  Assessment:  58 yo M with recent radial prostatectomy on 11/17/23 found to have complicated intra-abdominal infection. He underwent drain placement with IR on 12/17/23 into one of many fluid collections in the abdomen. The patient's course has been complicated by ileus as visualized on imaging and was now Day 6 of NPO status. Pharmacy consulted for TPN in this context.   Glucose / Insulin : A1c 6.0 - CBG <180, (4 units insulin  aspart /24 hours) Electrolytes: Na 133, K 4.4 (none in TPN), CoCa 9.7, Chloride 95, Phos 6.9 (none in TPN) Renal: CRRT off 9/30; iHD last session 10/1, plan for another 10/2 ; BUN 75  Hepatic: LFTs WNL, Alb 1.6; recent high triglyceride d/t propofol  use (stopped 9/30): TG 785 (9/30) >> 269 (10/03) Intake / Output; MIVF: UO 0.1 ml/kg/hr, NGT 370 mL, drain 130 mL ; 1.7 L removed from iHD  GI Imaging: 10/2 KUB : unchanged distention, ileus GI Surgeries / Procedures:   As above, none since TPN start  Central access: CVC TPN start date: 10/02  Nutritional Goals: Goal TPN rate is 65 mL/hr (provides 115 g of protein, 0g lipid, and 452g CHO for a total 2000 kcals per day) without lipids  Goal TPN rate is 70 mL/hr (provides 115 g of protein, 60g lipid, and 276g CHO for a total 2000 kcals per day) with lipids  RD Assessment: Estimated Needs Total Energy Estimated Needs: 2000-2200 kcal/d Total Protein Estimated Needs: 110-125g/d Total Fluid Estimated Needs: 2L/d  Current Nutrition:  09/26 NPO  10/02 half TPN 10/03 full TPN + TF  Plan:  Increase TPN to 65 mL/hr at 1800 to provide ~100% of needs --remove lipids with recent documented hyperTG 2/2 propofol  usage. Pt has been off propofol  since 9/30 AM, trending down  (anticipate can add back within the next few days) Electrolytes in TPN: Na 75 mEq/L, K 0 mEq/L, Ca 5 mEq/L, Mg 5 mEq/L, and Phos 0 mmol/L. Cl:Ace Max Chloride (comes to 1.2:1 ratio) Add standard MVI and trace elements to TPN Continue Sensitive q6h SSI and adjust as needed  Starting TF at low rate, increasing slowly to goal Monitor TPN labs on Mon/Thurs, daily until stable Daily triglyceride levels x5 days  Thank you for allowing pharmacy to be a part of this patient's care.  Shelba Collier, PharmD, BCPS Clinical Pharmacist

## 2023-12-23 NOTE — Procedures (Signed)
 Extubation Procedure Note  Patient Details:   Name: Glenn Stewart DOB: September 26, 1965 MRN: [id number]   Airway Documentation:    Vent end date: 12/23/23 Vent end time: 1149   Evaluation  O2 sats: stable throughout Complications: No apparent complications Patient did tolerate procedure well. Bilateral Breath Sounds: Clear   Yes  Pt extubated per order to 4L [place]. Pt had a positive cuff leak, able to state name, and has a good cough, and no stridor noted.   Shan LITTIE Collum 12/23/2023, 11:50 AM

## 2023-12-23 NOTE — Progress Notes (Addendum)
 Persistent hypertension reported to [facility] MD. Patient has ranged from 153-176. However mostly in the high 160's. Patient continues to be on Ketamine  post extubation.

## 2023-12-23 NOTE — Progress Notes (Signed)
 Admit: 12/16/2023 LOS: 7  68M with AKI after presenting with encephalopathy, A-fib with RVR/ventricular tachycardia, found to have likely abdominal urinomas with abscess.   Subjective:  HD again yesterday, UF 1.9 L. Afebrile.  On amiodarone .  No pressors. UOP 138 mL. HD not required today.  Vital signs stable.  Pending potential IR procedure today Remains intubated   10/02 0701 - 10/03 0700 In: 1846.3 [I.V.:1446.3; IV Piggyback:400] Out: 2643 [Urine:138; Emesis/NG output:380; Drains:225]  Filed Weights   12/21/23 1801 12/22/23 0500 12/23/23 0500  Weight: 96.1 kg 97.8 kg 96.8 kg    Scheduled Meds:  amiodarone   150 mg Intravenous Once   bisacodyl   10 mg Rectal Daily   Chlorhexidine  Gluconate Cloth  6 each Topical Daily   Chlorhexidine  Gluconate Cloth  6 each Topical Q0600   Chlorhexidine  Gluconate Cloth  6 each Topical Q0600   Chlorhexidine  Gluconate Cloth  6 each Topical Q0600   diatrizoate meglumine-sodium  90 mL Per Tube Once   heparin  injection (subcutaneous)  5,000 Units Subcutaneous Q8H   insulin  aspart  0-9 Units Subcutaneous Q4H   metoCLOPramide (REGLAN) injection  5 mg Intravenous Q6H   mouth rinse  15 mL Mouth Rinse Q2H   pantoprazole  (PROTONIX ) IV  40 mg Intravenous Daily   sodium chloride  flush  5 mL Intracatheter Q8H   Continuous Infusions:  amiodarone  30 mg/hr (12/23/23 0400)   dexmedetomidine  (PRECEDEX ) IV infusion 1.2 mcg/kg/hr (12/23/23 0400)   fentaNYL  infusion INTRAVENOUS Stopped (12/20/23 1415)   ketamine  (KETALAR ) adult infusion 0.5 mg/kg/hr (12/23/23 0400)   meropenem (MERREM) IV 1 g (12/22/23 1521)   TPN ADULT (ION) 35 mL/hr at 12/23/23 0400   PRN Meds:.alteplase, heparin , heparin  sodium (porcine), HYDROmorphone  (DILAUDID ) injection, HYDROmorphone  (DILAUDID ) injection, lidocaine  (PF), lidocaine -prilocaine, mouth rinse, pentafluoroprop-tetrafluoroeth  Current Labs: reviewed    Physical Exam:  Blood pressure 116/73, pulse 75, temperature 97.9 F  (36.6 C), resp. rate 19, height 5' 5 (1.651 m), weight 96.8 kg, SpO2 96%.  GEN: Intubated ENT: ET tube in place, NG tube in place, LIJ CV: Regular, normal S1 and S2 PULM: Bilateral mechanical breath sound ABD: Distended abdomen, grimaces to palpation. EXT: no LEE  A AKI, anuric, baseline normal GFR.  No evidence of obstruction on imaging at presentation.  Suspect ATN.  Repeat bladder pressure not consistent with abdominal compartment syndrome on 9/27.  CT A/P w contrast, 10/1: Renal unremarkable. Hyponatremia, stable; Cont RRT Hyperkalemia: Likely due to renal failure.  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 tach at presentation, cardiology following on amiodarone  Septic shock on linezolid  and Zosyn , Off pressors Hyperphosphatemia setting of AKI, improved with HD Metabolic acidosis, improved on HD. Recurrent fevers, improved   P Stopped CRRT 9/30. Failed Lasix  stress test.  HD, 10/1 Plan for HD tomorrow Daily weights, Daily Renal Panel, Strict I/Os, Avoid nephrotoxins (NSAIDs, judicious IV Contrast)  Medication Issues; Preferred narcotic agents for pain control are hydromorphone , fentanyl , and methadone. Morphine  should not be used.  Baclofen should be avoided Avoid oral sodium phosphate  and magnesium  citrate based laxatives / bowel preps   Discussed with primary team  Dr. Evalene HERO Ashtan Laton  12/23/2023, 7:16 AM  Recent Labs  Lab 12/21/23 1829 12/22/23 0359 12/22/23 1527  NA 132* 132* 131*  K 4.1 5.2* 5.4*  CL 95* 96* 92*  CO2 23  22 20*  GLUCOSE 123* 123* 128*  BUN 50* 77* 103*  CREATININE 2.88* 4.29* 5.21*  CALCIUM  7.7* 7.6* 7.5*  PHOS 4.7* 7.4* 9.8*   Recent Labs  Lab 12/20/23 0414 12/21/23 0432 12/22/23 0359  WBC 13.8* 16.9* 13.7*   NEUTROABS 10.9* 13.5* 10.7*  HGB 10.6* 10.5* 10.6*  HCT 31.4* 32.0* 31.8*  MCV 80.3 81.4 80.1  PLT 281 310 338

## 2023-12-23 NOTE — Progress Notes (Signed)
 NAME:  Glenn Stewart, MRN:  [id number], DOB:  19-Jul-1965, LOS: 7 ADMISSION DATE:  12/16/2023, CONSULTATION DATE:  12/16/23 REFERRING MD:  Dr. Cottie, CHIEF COMPLAINT:  weakness   bRIEF  HPI obtained from EMR as pt is intubated and sedated.   46 yoM with PMH of HTN, CAD, CABG x 5 04/2022 with post op Afib since stopped on Eliquis  and amiodarone  09/2022, HTN, HLD, GERD, NASH, bipolar/ ADHD and s/p recent robotic assisted laparoscopic radial prostatectomy on 11/17/23 who presented for weakness and waxing mental status with distended and tender abdominal by EMS found with episodes of Afib with RVR and VT.  Was hemodynamically unstable on arrival, underwent emergent cardioversion and given amiodarone  x2 and magnesium , with emerging rhythm of afib with RVR possible flutter.  Cardiology consulted. Post cardioversion became hypotensive with respiratory distress therefore intubated and placed on norepinephrine .  Difficulty oxygenating post intubation with normal PIP requiring 1.0 and 12 PEEP.  Labs noted for K2.8, Na 129, bicarb 16, BUN/ sCr 55/ 3.92 (recently  ), t. Bili 3.4, AST 68, albumin  1.6, protein 3.4, INR 1.2.  CTH neg, CTA PE and CT a/p w/contrast pending official read.  2.1L of brownish OGT contents s/p OGT placement. Cultures sent, empiric zosyn / vanc started.  Afebrile, anuric and since weaned off pressors.  R internal jugular CVL placed by EDP.  PCCM consulted for admission.     NOTE:LD him as outpatient, patient self-irrigated catheter a couple days after initial surgery and then removed catheter himself prior to follow-up at 1 week. Patient reported that he cut tubing prior to removal but unable to confirm if removal was truly atraumat   Pertinent  Medical History    has a past medical history of ADHD (attention deficit hyperactivity disorder), Anxiety state, unspecified, Bipolar 1 disorder (HCC), Cancer (HCC), CHEST PAIN (12/04/2008), Coronary artery disease, Depressive disorder, not elsewhere  classified, Dermatophytosis of nail, Esophageal reflux, History of kidney stones, HYPERGLYCEMIA (07/10/2007), Low back pain (09/18/2013), Other chronic nonalcoholic liver disease, Pre-diabetes, Problems with hearing, Prostatitis, unspecified, Pure hypercholesterolemia, Screen for STD (sexually transmitted disease) (04/20/2011), Sprain of cruciate ligament of knee, Tear of medial cartilage or meniscus of knee, current, TMJ (temporomandibular joint disorder) (12/25/2010), Unspecified essential hypertension, Unspecified gastritis and gastroduodenitis without mention of hemorrhage, and Urethritis, unspecified.   has a past surgical history that includes No prior surgery; LEFT HEART CATH AND CORONARY ANGIOGRAPHY (N/A, 05/10/2022); Coronary artery bypass graft (N/A, 05/12/2022); Radial artery harvest (Left, 05/12/2022); TEE without cardioversion (N/A, 05/12/2022); Cystoscopy w/ litholapaxy / EHL; and Robot assisted laparoscopic radical prostatectomy (N/A, 11/17/2023).   Significant Hospital Events: Including procedures, antibiotic start and stop dates in addition to other pertinent events   9/26> admit 9/27: 90% fio2 on ventilator. On diprivan  gtt, fent gtt, amio gtt. CVP 9.  AFebrile. Cutlure negative so far. cReat 5.13 and climbing (Baseline 0.91 on 8/292/25). AKI started 12/16/23. Normal lactate byu Ionized calcim is low. Seen by urology  & CCS- recommending  IR attemt of intrabdominal abscess that is in RLQ and tracks down bladder..  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 CCS does not think there is appendicit or diveritculits. And that SBO/Colitis is secondary to urinoma. Off levop and followed commands. aNURIC - 100cc only since admit 9/27 CT IMPRESSION: . There is focal irregularity at the junction of the bladder base and urethra with small anterior contrast pooling suggesting contained perforation. No intraperitoneal contrast extravasation. Appearance examination is otherwise unchanged. Again  demonstrated re multiple loculated fluid collections  in the abdomen and pelvis onsistent with abscesses. Wall thickening of the cecum and terminal ileum. Fluid-filled dilated small bowel appear mildly improved.  Consolidation or atelectasis in both lung bases, possibly pneumonia, compressive atelectasis, or aspiration. Small bilateral pleural effusions  . Wall thickening of the ascending colon and hepatic flexure, new since prior study. This may indicate colitis or inflammatory bowel disease versus reactive change. Dilated fluid-filled small bowel with air-fluid levels and mild diffuse wall thickening. Terminal ileum is decompressed. This is consistent with small-bowel obstruction, possibly due to inflammatory strictures. IR Procedure: DRAINAGE CATHETER PLACEMENT into a RIGHT LOWER QUADRANT FLUID COLLECTION ->> 1L drained HD CATH Placed CRRT started 9/28 - On vent. Ongoing CRRT. Seen by cCS  - still concernd about Urinoma and secondary blowe irritation and not a true obstruction NGT output is low c/w ileus. CCS does  NOT want enteral medication. Recommnding max only trophic feds. CUlture negative. Afebrile.  Stopped on Zyvox  and Zosyn  9/29 SBO Xr protocol per [facility] with oral contrast pooling in the gastric fundus. MSSA in abdominal cultures and Enterobacter cloacae in Ucx - on Cefepime and Flagyl  SBT trial daiiled secondary to RR >40 and increased WOB. Back on PRVC 9/30 discontinued CRRT.  10/1 - switched to meropenem. Repeat CT A/P with stable intraabdominal abscesses. Started iHD   Interim History / Subjective:  2x large stools in the past 24 hr. 1x haldol  on 10/2. Awake and more agitation. SBTs failed yesterday 2/2 RR >40s. Today s/p haldol and RR ~18. No ABG this AM yet Denies pain this AM  Objective    Blood pressure 116/73, pulse 75, temperature 97.9 F (36.6 C), resp. rate 19, height 5' 5 (1.651 m), weight 96.8 kg, SpO2 96%. CVP:  [3 mmHg-25 mmHg] 12 mmHg  Vent Mode: PRVC FiO2  (%):  [40 %] 40 % Set Rate:  [20 bmp] 20 bmp Vt Set:  [490 mL] 490 mL PEEP:  [5 cmH20] 5 cmH20 Pressure Support:  [8 cmH20] 8 cmH20 Plateau Pressure:  [17 cmH20-20 cmH20] 20 cmH20   Intake/Output Summary (Last 24 hours) at 12/23/2023 0912 Last data filed at 12/23/2023 0428 Gross per 24 hour  Intake 1744.12 ml  Output 2463 ml  Net -718.88 ml  2L removed during iHD 10/2  Filed Weights   12/21/23 1801 12/22/23 0500 12/23/23 0500  Weight: 96.1 kg 97.8 kg 96.8 kg    Examination: General: Ill appearing on vent Head: ETT in and OG to LIWS CV: RRR Pulmonary: Bilateral rales Abdominal: Soft, mildly tender, distended but improving. +BS Extremities:  moving purposefully Skin: warm and dry Neuro: Hard of hearing. Alert and following commands Psych: mildly agitated    Labs reviewed:  Assessment and Plan   Prostate cancer s/p robotic assisted laparoscopic radial prostatectomy on 11/17/23 S/p IR drainage on 9/27 Enterobacter cloacae in Urine Cx Abdominal abscess with MSSA No fevers overnight. Improving leuk 13.7>11 - Stopped on Zyvox  (9/27 - - Stopped Zosyn  (9/26 - - Cefepime and flagyl  9/29 - 9/30 - Meropenem 10/1 - - tracheal aspirate pending - 9/30 Bcx - NGTD - Will likely need extended antibiotic coverage  Ileus, improving intraabdominal fluid collections and abscess at anterior anastomosis Distal colon enterocolitis CT A/P without overt obstruction. Oral contrast given. S/p reglan with 2x BM - Reglan q6HR; renally adjusted - starting trickle feeds today - Continue on GNR and anaerobic coverage - Pain control with Ketamine , tylenol , and PRN dilaudid   Acute hypoxic and hypercarbic respiratory failure Aspiration of BLL PNA  CTA PE neg for acute PE.  - Abx for possible pna coverage 9/26 - 9/30 - Continues on Abx for above - PRVC with VAP bundle - SBT today s/p Haldol - Will repeat ABG and extubate if ok  Afib VT, monomorphic Hx CAD, prior CABG x 5 04/2022, and post  op Afib (off amio/ eliquis  09/2022), HTN, HLD - s/p emergent cardioversion during this admission - Will continue on amiodarone  drip; PO when able - Admission echo ef 55% without wall motion abnormalities - appreciate cardiology recommendations - Qtc 267 today  AKI since 9/27 post surgery 10/2023 Prostatectomy with complicated post op course Per Dr. Cam, urologist, patient was self irrigating bladder through foley and removed the foley cath 4 days ahead of schedule Improving renal function panel. 138 mL urine - Discontinued CRRT on 9/30 - iHD per nephrology - Foley to remain in place; repeat cystogram ~10/7-10 per urology  Acute on chronic anemia  Critical illness 11.3 - Continue to monitor - Transfuse for Hgb <8  Elevated AST  Elevated T bili  - CMP tomorrow  Neuro/Encephalopathy  ?concern for bipolar disorder; see Dr. Monte note from 9/30 - On sedation - trial of haldol for agitation today  Best practice:  Diet: TPN starting today; Trickle feeds today Pain/Anxiety/Delirium protocol (if indicated): haldol PRN VAP protocol (if indicated): YES DVT prophylaxis: heparin  5000 tid 12/17/23 GI prophylaxis: PPI Glucose control: ssi Mobility: bed rest Code Status: full code DISPOSITION: ICU  Family Communication:   - Called Mom Rock Spanner [phone] - she said she is 29 and deferred that I called daughter but did inform her that he is on life support  - Called Sister Lucian Pond [phone]- called and  updated 9:22 AM 12/17/2023 (she is in [place] and planning to come). She 0  - 12/18/23:   Called  sister Lucian Pond - and updated  - 9/29: attempted without success.  - 9/30: discussed with sister Tena  - 10/1 -  RN discussed with sister Randine ENGLAND   Hadassah Kristy Ahr, MD [facility] Program - PGY-3 12/23/2023, 9:12 AM  LABS    PULMONARY Recent Labs  Lab 12/16/23 2124 12/17/23 0422 12/17/23 0908 12/17/23 1002  12/17/23 1037 12/19/23 1035  PHART 7.301* 7.292* 7.346*  --  7.349* 7.383  PCO2ART 41.3 42.5 36.8  --  34.2 40.3  PO2ART 102 95 116*  --  91 78*  HCO3 20.4 20.6 20.1  --  18.8* 24.0  TCO2 22 22 21*  --  20* 25  O2SAT 97 97 98 77.7 97 95    CBC Recent Labs  Lab 12/21/23 0432 12/22/23 0359 12/23/23 0638  HGB 10.5* 10.6* 11.3*  HCT 32.0* 31.8* 35.1*  WBC 16.9* 13.7* 11.0*  PLT 310 338 359    COAGULATION Recent Labs  Lab 12/16/23 1524  INR 1.2    CARDIAC  No results for input(s): TROPONINI in the last 168 hours. No results for input(s): PROBNP in the last 168 hours.   CHEMISTRY Recent Labs  Lab 12/19/23 0417 12/19/23 1035 12/20/23 0414 12/20/23 0848 12/21/23 0432 12/21/23 1829 12/22/23 0359 12/22/23 1527 12/23/23 0638  NA 131*   < > 131*   < > 132* 132* 132* 131* 133*  K 4.2   < > 4.5   < > 5.6* 4.1 5.2* 5.4* 4.4  CL 99   < > 97*   < > 101 95* 96* 92* 95*  CO2  21*   < > 21*   < > 20* 23 22 20* 23  GLUCOSE 141*   < > 104*   < > 132* 123* 123* 128* 161*  BUN 47*   < > 45*   < > 77* 50* 77* 103* 75*  CREATININE 2.99*   < > 2.61*   < > 3.75* 2.88* 4.29* 5.21* 4.25*  CALCIUM  7.7*   < > 7.7*   < > 7.7* 7.7* 7.6* 7.5* 7.7*  MG 3.0*  --  2.8*  --  2.8*  --  2.4  --  2.4  PHOS 3.3   < > 3.3   < > 7.3* 4.7* 7.4* 9.8* 6.9*   < > = values in this interval not displayed.   Estimated Creatinine Clearance: 20 mL/min (A) (by C-G formula based on SCr of 4.25 mg/dL (H)).   LIVER Recent Labs  Lab 12/16/23 1524 12/17/23 0333 12/18/23 0336 12/18/23 1615 12/19/23 0416 12/19/23 0417 12/20/23 0414 12/20/23 0848 12/21/23 1200 12/21/23 1829 12/22/23 0359 12/22/23 1527 12/23/23 0638  AST 68*   < > 85*  --  62*  --  44*  --  38  --  36  --   --   ALT 37   < > 41  --  34  --  27  --  22  --  25  --   --   ALKPHOS 69   < > 105  --  116  --  122  --  112  --  97  --   --   BILITOT 3.4*   < > 3.1*  --  2.3*  --  1.7*  --  1.3*  --  1.0  --   --   PROT 5.2*   < > 5.4*   --  5.6*  --  6.2*  --  6.1*  --  6.0*  --   --   ALBUMIN  1.6*   < > <1.5*  <1.5*   < > <1.5*   < > <1.5*  <1.5*   < > <1.5* <1.5* <1.5*  <1.5* <1.5* 1.6*  INR 1.2  --   --   --   --   --   --   --   --   --   --   --   --    < > = values in this interval not displayed.     INFECTIOUS Recent Labs  Lab 12/16/23 1600 12/16/23 1839 12/16/23 2023  LATICACIDVEN 3.5* 1.2 1.3     ENDOCRINE CBG (last 3)  Recent Labs    12/22/23 2324 12/23/23 0343 12/23/23 0716  GLUCAP 154* 177* 155*     IMAGING x48h  - image(s) personally visualized  -   highlighted in bold DG Abd Portable 1V Result Date: 12/22/2023 CLINICAL DATA:  Ileus. EXAM: PORTABLE ABDOMEN - 1 VIEW COMPARISON:  Most recent radiograph 12/20/2023, CT 12/21/2023 FINDINGS: Unchanged gaseous small bowel distention in the central abdomen. There is also air-filled transverse colon. Tip and side port of the enteric tube below the diaphragm in the stomach. Right lower quadrant drain remains in place. No evidence of free air. IMPRESSION: Unchanged gaseous small bowel in transverse colonic distention in the central abdomen, favoring ileus. Electronically Signed   By: Andrea Gasman M.D.   On: 12/22/2023 11:32   DG CHEST PORT 1 VIEW Result Date: 12/21/2023 EXAM: 1 VIEW(S) XRAY OF THE CHEST 12/21/2023 05:57:00 PM COMPARISON: 12/19/2023 CLINICAL HISTORY: Dyspnea FINDINGS:  LINES, TUBES AND DEVICES: Endotracheal tube in place with tip 2.1 cm above carina. Enteric tube with tip below the diaphragm and sidehole overlying the stomach. Right internal jugular central venous catheter in place with tip over right atrium. Left internal jugular central venous catheter in place with tip over brachiocephalic vein. LUNGS AND PLEURA: Low lung volumes. Bibasilar patchy opacities. Small left pleural effusion. No pneumothorax. No pulmonary edema. HEART AND MEDIASTINUM: Median sternotomy wires and mediastinal clips noted. No acute abnormality of the cardiac and  mediastinal silhouettes. BONES AND SOFT TISSUES: No acute osseous abnormality. IMPRESSION: 1. Low lung volumes and bibasilar patchy opacities. 2. Small left pleural effusion. 3. Support devices as above. Electronically signed by: Donnice Mania MD 12/21/2023 07:14 PM EDT RP Workstation: [id number]   CT ABDOMEN PELVIS W CONTRAST Result Date: 12/21/2023 CLINICAL DATA:  Intraabdominal abscess EXAM: CT ABDOMEN AND PELVIS WITH CONTRAST TECHNIQUE: Multidetector CT imaging of the abdomen and pelvis was performed using the standard protocol following bolus administration of intravenous contrast. RADIATION DOSE REDUCTION: This exam was performed according to the departmental dose-optimization program which includes automated exposure control, adjustment of the mA and/or kV according to patient size and/or use of iterative reconstruction technique. CONTRAST:  75mL OMNIPAQUE  IOHEXOL  350 MG/ML SOLN COMPARISON:  12/20/2023, 12/17/2023, 12/16/2023 FINDINGS: Lower chest: No focal airspace consolidation or pleural effusion.Posterior bibasilar dependent atelectasis. Dense multi-vessel coronary atherosclerosis. Sternotomy wires and postsurgical changes of a prior CABG. Hepatobiliary: No mass.Decompressed gallbladder containing a few small radiopaque stones. Apparent enhancing soft tissue in the gallbladder fundus, may represent normal decompressed gallbladder wall in close approximation, or focal adenomyomatosis. No intrahepatic or extrahepatic biliary ductal dilation. The portal veins are patent. Pancreas: No mass or main ductal dilation. No peripancreatic inflammation or fluid collection. Spleen: Normal size. No mass. Adrenals/Urinary Tract: Similar multifocal nodularity in the left adrenal gland, with the largest nodule measuring 1.9 cm. There is also likely a small adrenal nodule in the medial right adrenal gland (axial 37). No renal mass. No nephrolithiasis or hydronephrosis. The urinary bladder is completely decompressed with  a low lying urinary catheter in place. Stomach/Bowel: Esophagogastric tube terminates in the stomach, well-positioned. Enteric contrast present within the stomach and proximal small bowel. While a couple of segments of small bowel in the left upper quadrant are dilated, no abrupt transition point present to suggest small bowel obstruction. Fluid present within the proximal colon.Normal appendix. Sigmoid colonic diverticulosis. No changes of acute diverticulitis. Vascular/Lymphatic: No aortic aneurysm. No intraabdominal or pelvic lymphadenopathy. Reproductive: Prostatectomy.No free pelvic fluid. Other: No pneumoperitoneum. Similar diffuse mesenteric stranding with perinephric and retroperitoneal stranding, more pronounced on the right. Interval decompression of the right retroperitoneal collection, measuring 2.8 x 7.7 cm (previously 5.6 x 10.6 cm, by my measurement), with a similarly well positioned percutaneous pigtail drainage catheter in the lower portion of the collection. Multiple small low-density fluid collections along the right lateral omentum and right paracolic gutter, measuring up to 2.4 x 4.1 cm (axial 56). A small fluid collection along the ventral right abdominal wall (axial 73), is unchanged measuring 1.9 x 7.1 cm. The irregular fluid collection in the right lower quadrant extending to the midline ventral abdominal wall, measures 6.5 x 14.4 x 13.3 cm (APxTRxCC), axial 80. Right lower quadrant, ileocolic mesentery collection appears similar measuring 4.2 x 4.4 cm (axial 56). The fluid collections along the pelvic sidewall are improving in the interim. For example, the right sided collection measures 5.6 x 2.5 cm (previously 6.5 x 4.7 cm, axial 93). Musculoskeletal:  No acute fracture or destructive lesion. Multilevel degenerative disc disease of the spine. IMPRESSION: 1. Interval decompression of the right retroperitoneal fluid collection, measuring 2.8 x 7.7 cm (previously, 5.6 x 10.6 cm, by my  measurement). The pigtail percutaneous drainage catheter is otherwise similarly well-positioned in the fluid collection. 2. irregular fluid collection in the right lower quadrant extending to the midline ventral abdominal wall, measures 6.5 x 14.4 x 13.3 cm (axial 80), unchanged. 3. The bilateral pelvic sidewall fluid collections have decreased in size in the interim, measuring 5.6 x 2.5 cm on the right (previously, 6.5 x 4.7 cm). 4. Multiple small additional fluid collections in the right ileocolic mesentery, along the right paracolic gutter, and the right lower quadrant ventral abdominal wall are unchanged, as described above. 5. Similarly positioned low-lying catheter in the urinary bladder, which may be expected positioning status post prostatectomy. 6. Well-positioned esophagogastric tube terminating in the stomach. While a couple of segments of small bowel in the upper abdomen are distended, no abrupt transition point present to suggest small bowel obstruction. Aortic Atherosclerosis (ICD10-I70.0). Electronically Signed   By: Rogelia Myers M.D.   On: 12/21/2023 12:55

## 2023-12-23 NOTE — Progress Notes (Signed)
 eLink Physician-Brief Progress Note Patient Name: Glenn Stewart DOB: 28-Dec-1965 MRN: [id number]   Date of Service  12/23/2023  HPI/Events of Note  Patient initially admitted with septic shock secondary to intra-abdominal abscess.  Was treated and drained.  Now off pressors but has been hypertensive all day with maps up to 140s.  No intervention throughout the day, cardiology following and adjusting meds for wide-complex tachycardia/A-fib  eICU Interventions  For now, continue observation unless sustained systolics above 170     Intervention Category Intermediate Interventions: Hypertension - evaluation and management  Maddex Garlitz 12/23/2023, 11:47 PM

## 2023-12-23 NOTE — Procedures (Signed)
 Interventional Radiology Procedure:   Indications: Evaluate abscess drain for a bowel or urinary fistula  Procedure: Drain injection  Findings: No bowel or urinary fistula.  See full report in IMAGING  Complications: None     EBL: Minimal  Plan: Continue with drain management.  Cru Kritikos R. Philip, MD  Pager: [phone]

## 2023-12-23 NOTE — Progress Notes (Signed)
 Brief Nutrition Support Note  RD consulted to begin trickle feeds. See recommendations below.  MD approved beginning trickles today, recommend: Continuous feeds via OGT Vital 1.5 @20mL /h Prosource TF20 60 mL daily  Continue TPN regimen until pt able to reach EN goal rate per pharmacy: TPN at 35 mL/hr at 1800 to provide ~50% of needs on Day 1 of TPN --remove lipids with recent documented hyperTG 2/2 propofol  usage. Pt has been off propofol  since 9/30 AM will f/u triglycerides in AM for adjustments to TPN Electrolytes in TPN: Na 75 mEq/L, K 0 mEq/L, Ca 5 mEq/L, Mg 5 mEq/L, and Phos 0 mmol/L. Cl:Ace Max Chloride (comes to 1.2:1 ratio) Add standard MVI and trace elements to TPN  When able to advance, recommend slowly increasing rate 10 ml q12h and watching for tolerance until goal rate is reached; Recommend the following: Vital 1.5 at 60 ml/h (1440 ml per day) Start at 20 and hold until pt showing signs of tolerance. Then advance by 10mL every 12 hours to reach goal Prosource TF20 60 ml 1x/d Provides 2240 kcal, 117 gm protein, 1100 ml free water  daily    Josette Glance, MS, RDN, LDN Clinical Dietitian I Please reach out via secure chat

## 2023-12-24 DIAGNOSIS — I10 Essential (primary) hypertension: Secondary | ICD-10-CM | POA: Diagnosis not present

## 2023-12-24 DIAGNOSIS — A419 Sepsis, unspecified organism: Secondary | ICD-10-CM | POA: Diagnosis not present

## 2023-12-24 DIAGNOSIS — K567 Ileus, unspecified: Secondary | ICD-10-CM | POA: Diagnosis not present

## 2023-12-24 DIAGNOSIS — R000 Tachycardia, unspecified: Secondary | ICD-10-CM | POA: Diagnosis not present

## 2023-12-24 DIAGNOSIS — R579 Shock, unspecified: Secondary | ICD-10-CM | POA: Diagnosis not present

## 2023-12-24 DIAGNOSIS — R6521 Severe sepsis with septic shock: Secondary | ICD-10-CM | POA: Diagnosis not present

## 2023-12-24 DIAGNOSIS — I4891 Unspecified atrial fibrillation: Secondary | ICD-10-CM | POA: Diagnosis not present

## 2023-12-24 LAB — HEPATIC FUNCTION PANEL
ALT: 20 U/L (ref 0–44)
AST: 32 U/L (ref 15–41)
Albumin: 1.6 g/dL — ABNORMAL LOW (ref 3.5–5.0)
Alkaline Phosphatase: 90 U/L (ref 38–126)
Bilirubin, Direct: 0.2 mg/dL (ref 0.0–0.2)
Indirect Bilirubin: 0.5 mg/dL (ref 0.3–0.9)
Total Bilirubin: 0.7 mg/dL (ref 0.0–1.2)
Total Protein: 6 g/dL — ABNORMAL LOW (ref 6.5–8.1)

## 2023-12-24 LAB — BASIC METABOLIC PANEL WITH GFR
Anion gap: 17 — ABNORMAL HIGH (ref 5–15)
BUN: 113 mg/dL — ABNORMAL HIGH (ref 6–20)
CO2: 19 mmol/L — ABNORMAL LOW (ref 22–32)
Calcium: 7.5 mg/dL — ABNORMAL LOW (ref 8.9–10.3)
Chloride: 98 mmol/L (ref 98–111)
Creatinine, Ser: 5.92 mg/dL — ABNORMAL HIGH (ref 0.61–1.24)
GFR, Estimated: 10 mL/min — ABNORMAL LOW (ref >60)
Glucose, Bld: 202 mg/dL — ABNORMAL HIGH (ref 70–99)
Potassium: 4.1 mmol/L (ref 3.5–5.1)
Sodium: 134 mmol/L — ABNORMAL LOW (ref 135–145)

## 2023-12-24 LAB — GLUCOSE, CAPILLARY
Glucose-Capillary: 195 mg/dL — ABNORMAL HIGH (ref 70–99)
Glucose-Capillary: 261 mg/dL — ABNORMAL HIGH (ref 70–99)
Glucose-Capillary: 184 mg/dL — ABNORMAL HIGH (ref 70–99)
Glucose-Capillary: 111 mg/dL — ABNORMAL HIGH (ref 70–99)
Glucose-Capillary: 113 mg/dL — ABNORMAL HIGH (ref 70–99)

## 2023-12-24 LAB — CULTURE, RESPIRATORY W GRAM STAIN

## 2023-12-24 LAB — PHOSPHORUS: Phosphorus: 6.4 mg/dL — ABNORMAL HIGH (ref 2.5–4.6)

## 2023-12-24 LAB — MAGNESIUM: Magnesium: 2.4 mg/dL (ref 1.7–2.4)

## 2023-12-24 LAB — TRIGLYCERIDES: Triglycerides: 279 mg/dL — ABNORMAL HIGH (ref <150)

## 2023-12-24 MED ORDER — INSULIN ASPART 100 UNIT/ML IJ SOLN
0.0000 [IU] | Freq: Three times a day (TID) | INTRAMUSCULAR | Status: DC
  Administered 2023-12-26: 2 [IU] via SUBCUTANEOUS

## 2023-12-24 MED ORDER — INSULIN ASPART 100 UNIT/ML IJ SOLN
0.0000 [IU] | INTRAMUSCULAR | Status: DC
  Administered 2023-12-24: 3 [IU] via SUBCUTANEOUS
  Administered 2023-12-24: 8 [IU] via SUBCUTANEOUS

## 2023-12-24 MED ORDER — TRACE MINERALS CU-MN-SE-ZN 300-55-60-3000 MCG/ML IV SOLN
INTRAVENOUS | Status: DC
  Filled 2023-12-24: qty 766.48

## 2023-12-24 MED ORDER — CHLORHEXIDINE GLUCONATE CLOTH 2 % EX PADS
6.0000 | MEDICATED_PAD | Freq: Every day | CUTANEOUS | Status: DC
  Administered 2023-12-24 – 2024-01-02 (×10): 6 via TOPICAL

## 2023-12-24 MED ORDER — RENA-VITE PO TABS
1.0000 | ORAL_TABLET | Freq: Every day | ORAL | Status: DC
  Administered 2023-12-24 – 2024-01-01 (×9): 1 via ORAL
  Filled 2023-12-24 (×9): qty 1

## 2023-12-24 MED ORDER — BOOST / RESOURCE BREEZE PO LIQD CUSTOM
1.0000 | Freq: Three times a day (TID) | ORAL | Status: DC
Start: 2023-12-24 — End: 2023-12-27
  Administered 2023-12-24: 237 mL via ORAL
  Administered 2023-12-24 – 2023-12-27 (×9): 1 via ORAL

## 2023-12-24 MED ORDER — THIAMINE MONONITRATE 100 MG PO TABS
100.0000 mg | ORAL_TABLET | Freq: Every day | ORAL | Status: AC
  Administered 2023-12-24 – 2023-12-30 (×7): 100 mg via ORAL
  Filled 2023-12-24 (×7): qty 1

## 2023-12-24 MED ORDER — PROSOURCE PLUS PO LIQD
30.0000 mL | Freq: Three times a day (TID) | ORAL | Status: DC
  Administered 2023-12-24 – 2024-01-02 (×22): 30 mL via ORAL
  Filled 2023-12-24 (×22): qty 30

## 2023-12-24 MED ORDER — AMLODIPINE BESYLATE 5 MG PO TABS
5.0000 mg | ORAL_TABLET | Freq: Every day | ORAL | Status: DC
Start: 2023-12-24 — End: 2024-01-02
  Administered 2023-12-24 – 2024-01-02 (×10): 5 mg via ORAL
  Filled 2023-12-24 (×10): qty 1

## 2023-12-24 MED ORDER — ADULT MULTIVITAMIN W/MINERALS CH
1.0000 | ORAL_TABLET | Freq: Every day | ORAL | Status: DC
  Administered 2023-12-24: 1 via ORAL
  Filled 2023-12-24: qty 1

## 2023-12-24 MED ORDER — METOPROLOL TARTRATE 25 MG PO TABS
25.0000 mg | ORAL_TABLET | Freq: Two times a day (BID) | ORAL | Status: DC
  Administered 2023-12-24 – 2023-12-27 (×7): 25 mg via ORAL
  Filled 2023-12-24 (×7): qty 1

## 2023-12-24 NOTE — Progress Notes (Signed)
 Nutrition Follow-up  DOCUMENTATION CODES:   Not applicable  INTERVENTION:   If pt does not tolerate diet advancement and/or not taking much po in 24 hours, recommend insertion of Small-bore NG by RN with initiation of TF. Discussed with Dr. Theodoro who agrees with this plan. Cortrak can be placed on Monday 10/06 if needed.  TPN to be discontinued with discontinuation of Central Line per MD  Boost Breeze po TID, each supplement provides 250 kcal and 9 grams of protein. Recommend changing to Ensure Plus High Protein po TID once diet advanced  30 ml ProSource Plus TID, each supplement provides 100 kcals and 15 grams protein.   Add Renal MVI daily Pt received MVI/trace minerals briefly while on TPN. May need to add standard MVI with Minerals in addition to Renal MVI if insufficient intake with po diet  TF recommendations if NG/Cortrak placed:  Vital 1.5 at 55 ml/hr with Pro-Source TF20 60 mL daily Begin at 20 ml/hr, titrate by 10 mL q 8 hours until goal rate of 55 ml/hr TF regimen provides 2060 kcals, 109 g of protein and 1003 mL of free water   NUTRITION DIAGNOSIS:   Increased nutrient needs related to acute illness as evidenced by estimated needs.  Continues   GOAL:   Patient will meet greater than or equal to 90% of their needs  Not met  MONITOR:   PO intake, Supplement acceptance, Diet advancement, Labs  REASON FOR ASSESSMENT:   Consult Enteral/tube feeding initiation and management  ASSESSMENT:   Pt with hx of CAD s/p CABGx5, HTN, HLD, GERD, atrial fibrillation, NASH, and s/p recent robotic assisted laparoscopic radial prostatectomy on 11/17/23 presented to ED from a urology appointment with poor level of consciousness.  09/26 - presented to ED, intubated, cardioversion 09/27 - drain placed in IR to RLQ, CRRT initiated 09/28 - trickles initiated 09/29 - TF held - SBO protocol ordered  09/30 - CRRT discontinued 10/01 - SBO protocol reinitiated, iHD 10/02 - TPN  initiated  10/03 - TPN increased to goal, TF ordered at trickle but then extubated, unclear if pt received any TF  RD received secure chat from MD stating plan to d/c central line and TPN. Noted TPN just started on 10/02 and increased to goal yesterday 10/03. Pt only with 1 day of full nutrition since admission.   Noted diet advanced to CL but no longer as enteral access for TF. TF discontinued. Unsure how much TF, if any, pt received given trickle TF ordered yesterday but then pt extubated, lost access  Discussed nutritional concerns for Dr. Pawar. If pt does not tolerate po diet and po diet advancement, plan for insertion of NG/Cortrak for re-initiation of TF.   Noted DTI to buttocks per RN skin assessment, not documented/present on admission  Receiving iHD again today  Consider calcium  supplementation given low corrected calcium , low ionized calcium . No iPTH, phosphorus elevated, AKI. Defer to Nephrology MD  CBGs as high as 261, off TF, only water  and ice chips so far today. Pt with orders for sliding scale only currently; will likely need more insulin  coverage  Labs: CBGs 122-261 (goal 140-180) BUN 133 (H) Creatinine 5.92 Sodium 134 (L) Phosphorus 6.4 (H) Magnesium  2.4 (wdl) Corrected Calcium  8.3 (L)-serum albumin  1.6, serum calcium  7.5 Ionized calcium  (10/3) 1.01 (L)  Meds: SS novolog  Thiamine 100 mg daily  Diet Order:   Diet Order             Diet clear liquid Room service appropriate? Yes; Fluid consistency:  Thin  Diet effective now                   EDUCATION NEEDS:   Not appropriate for education at this time  Skin:  Skin Assessment: Skin Integrity Issues: Skin Integrity Issues:: DTI DTI: buttocks  Last BM:  10/4 type 7  Height:   Ht Readings from Last 1 Encounters:  12/22/23 5' 5 (1.651 m)    Weight:   Wt Readings from Last 1 Encounters:  12/24/23 90 kg    Ideal Body Weight:  61.8 kg  BMI:  Body mass index is 33.02 kg/m.  Estimated  Nutritional Needs:   Kcal:  2000-2200 kcal/d  Protein:  95-115 g  Fluid:  2L/d   Betsey Finger MS, RDN, LDN, CNSC Registered Dietitian 3 Clinical Nutrition RD Inpatient Contact Info in Amion

## 2023-12-24 NOTE — Progress Notes (Signed)
   12/24/23 1215  Vitals  Temp 99.5 F (37.5 C)  Pulse Rate (!) 104  Resp (!) 23  BP (!) 171/101  SpO2 99 %  Weight 90 kg  Type of Weight Post-Dialysis  Oxygen Therapy  O2 Flow Rate (L/min) 4 L/min  Patient Activity (if Appropriate) In bed  Pulse Oximetry Type Continuous  Post Treatment  Dialyzer Clearance Heavily streaked  Hemodialysis Intake (mL) 0 mL  Liters Processed 75  Fluid Removed (mL) 2000 mL  Tolerated HD Treatment Yes   Received patient in bed to unit.  Alert and oriented.  Informed consent signed and in chart.   TX duration:3.5HRS  Patient tolerated well.  Transported back to the room  Alert, without acute distress.  Hand-off given to patient's nurse.   Access used: [facility] Access issues: NONE  Total UF removed: 2L Medication(s) given: NONE    [facility]

## 2023-12-24 NOTE — Progress Notes (Signed)
 Admit: 12/16/2023 LOS: 8  79M with AKI after presenting with encephalopathy, A-fib with RVR/ventricular tachycardia, found to have likely abdominal urinomas with abscess.   Subjective:  Extubated yesterday  No major events overnight. Afebrile.  Hypertensive overnight.  On Amiodarone . UOP 790 mL. Seen during HD.  Conversational.  On 5L [place].   10/03 0701 - 10/04 0700 In: 2152.4 [I.V.:2013.4; NG/GT:39; IV Piggyback:100] Out: 1680 [Urine:790; Emesis/NG output:300; Drains:590]  Filed Weights   12/22/23 0500 12/23/23 0500 12/24/23 0355  Weight: 97.8 kg 96.8 kg 92.2 kg    Scheduled Meds:  amiodarone   150 mg Intravenous Once   bisacodyl   10 mg Rectal Daily   Chlorhexidine  Gluconate Cloth  6 each Topical Daily   diatrizoate meglumine-sodium  90 mL Per Tube Once   feeding supplement (PROSource TF20)  60 mL Per Tube Daily   Gerhardt's butt cream   Topical BID   heparin  injection (subcutaneous)  5,000 Units Subcutaneous Q8H   insulin  aspart  0-15 Units Subcutaneous Q4H   metoCLOPramide (REGLAN) injection  5 mg Intravenous Q6H   mouth rinse  15 mL Mouth Rinse Q2H   pantoprazole  (PROTONIX ) IV  40 mg Intravenous Daily   sodium chloride  flush  5 mL Intracatheter Q8H   Continuous Infusions:  amiodarone  30 mg/hr (12/24/23 0354)   dexmedetomidine  (PRECEDEX ) IV infusion Stopped (12/23/23 1457)   feeding supplement (VITAL 1.5 CAL) Stopped (12/23/23 1145)   fentaNYL  infusion INTRAVENOUS Stopped (12/20/23 1415)   ketamine  (KETALAR ) adult infusion 0.5 mg/kg/hr (12/23/23 1800)   meropenem (MERREM) IV Stopped (12/23/23 1657)   TPN ADULT (ION) 65 mL/hr at 12/23/23 1850   TPN ADULT (ION)     PRN Meds:.HYDROmorphone  (DILAUDID ) injection, HYDROmorphone  (DILAUDID ) injection, mouth rinse  Current Labs: reviewed    Physical Exam:  Blood pressure (!) 169/85, pulse (!) 105, temperature 99.1 F (37.3 C), resp. rate 18, height 5' 5 (1.651 m), weight 92.2 kg, SpO2 96%.  GEN: NAD, on [place] ENT: LIJ CV:  Regular, normal S1 and S2 PULM: BLBS ABD: soft EXT: no LEE  A AKI, anuric, baseline normal GFR.  No evidence of obstruction on imaging at presentation.  Suspect ATN.  Repeat bladder pressure not consistent with abdominal compartment syndrome on 9/27.  CT A/P w contrast, 10/1: Renal unremarkable. Hyponatremia, stable; Cont RRT Hyperkalemia: Likely due to renal failure.  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 tach at presentation, cardiology following on amiodarone  Septic shock on linezolid  and Zosyn , Off pressors Hyperphosphatemia setting of AKI, improved with HD Metabolic acidosis, improved on HD. Recurrent fevers, improved   P Stopped CRRT 9/30. Failed Lasix  stress test.  1st HD, 10/1 Plan for HD today Consider IV Lasix  100 mg tomorrow and monitor urine output Daily weights, Daily Renal Panel, Strict I/Os, Avoid nephrotoxins (NSAIDs, judicious IV Contrast)  Medication Issues; Preferred narcotic agents for pain control are hydromorphone , fentanyl , and methadone. Morphine  should not be used.  Baclofen should be avoided Avoid oral sodium phosphate  and magnesium  citrate based laxatives / bowel preps   Discussed with primary team  Dr. Evalene HERO Janetta Vandoren  12/24/2023, 7:40 AM  Recent Labs  Lab 12/22/23 1527 12/23/23 0638 12/23/23 1129 12/24/23 0512  NA 131* 133* 134* 134*  K 5.4* 4.4 4.2 4.1  CL 92* 95*  --  98  CO2 20* 23  --  19*  GLUCOSE 128* 161*  --  202*  BUN 103* 75*  --  113*  CREATININE 5.21* 4.25*  --  5.92*  CALCIUM  7.5* 7.7*  --  7.5*  PHOS 9.8* 6.9*  --  6.4*   Recent Labs  Lab 12/20/23 0414 12/21/23 0432 12/22/23 0359 12/23/23 0638 12/23/23 1129  WBC 13.8* 16.9* 13.7* 11.0*  --   NEUTROABS 10.9* 13.5* 10.7*  --   --   HGB 10.6* 10.5*  10.6* 11.3* 10.5*  HCT 31.4* 32.0* 31.8* 35.1* 31.0*  MCV 80.3 81.4 80.1 81.4  --   PLT 281 310 338 359  --

## 2023-12-24 NOTE — Progress Notes (Signed)
 Persistent hypertension , currently BP 171/91, MAP 110. Informed Elink

## 2023-12-24 NOTE — Progress Notes (Signed)
 NAME:  Glenn Stewart, MRN:  [id number], DOB:  [birth year], LOS: 8 ADMISSION DATE:  12/16/2023, CONSULTATION DATE:  12/16/23 REFERRING MD:  Dr. Cottie, CHIEF COMPLAINT:  weakness   bRIEF  HPI obtained from EMR as pt is intubated and sedated.   17 yoM with PMH of HTN, CAD, CABG x 5 04/2022 with post op Afib since stopped on Eliquis  and amiodarone  09/2022, HTN, HLD, GERD, NASH, bipolar/ ADHD and s/p recent robotic assisted laparoscopic radial prostatectomy on 11/17/23 who presented for weakness and waxing mental status with distended and tender abdominal by EMS found with episodes of Afib with RVR and VT.  Was hemodynamically unstable on arrival, underwent emergent cardioversion and given amiodarone  x2 and magnesium , with emerging rhythm of afib with RVR possible flutter.  Cardiology consulted. Post cardioversion became hypotensive with respiratory distress therefore intubated and placed on norepinephrine .  Difficulty oxygenating post intubation with normal PIP requiring 1.0 and 12 PEEP.  Labs noted for K2.8, Na 129, bicarb 16, BUN/ sCr 55/ 3.92 (recently  ), t. Bili 3.4, AST 68, albumin  1.6, protein 3.4, INR 1.2.  CTH neg, CTA PE and CT a/p w/contrast pending official read.  2.1L of brownish OGT contents s/p OGT placement. Cultures sent, empiric zosyn / vanc started.  Afebrile, anuric and since weaned off pressors.  R internal jugular CVL placed by EDP.  PCCM consulted for admission.     NOTE:LD him as outpatient, patient self-irrigated catheter a couple days after initial surgery and then removed catheter himself prior to follow-up at 1 week. Patient reported that he cut tubing prior to removal but unable to confirm if removal was truly atraumat   Pertinent  Medical History    has a past medical history of ADHD (attention deficit hyperactivity disorder), Anxiety state, unspecified, Bipolar 1 disorder (HCC), Cancer (HCC), CHEST PAIN (12/04/2008), Coronary artery disease, Depressive disorder, not elsewhere  classified, Dermatophytosis of nail, Esophageal reflux, History of kidney stones, HYPERGLYCEMIA (07/10/2007), Low back pain (09/18/2013), Other chronic nonalcoholic liver disease, Pre-diabetes, Problems with hearing, Prostatitis, unspecified, Pure hypercholesterolemia, Screen for STD (sexually transmitted disease) (04/20/2011), Sprain of cruciate ligament of knee, Tear of medial cartilage or meniscus of knee, current, TMJ (temporomandibular joint disorder) (12/25/2010), Unspecified essential hypertension, Unspecified gastritis and gastroduodenitis without mention of hemorrhage, and Urethritis, unspecified.   has a past surgical history that includes No prior surgery; LEFT HEART CATH AND CORONARY ANGIOGRAPHY (N/A, 05/10/2022); Coronary artery bypass graft (N/A, 05/12/2022); Radial artery harvest (Left, 05/12/2022); TEE without cardioversion (N/A, 05/12/2022); Cystoscopy w/ litholapaxy / EHL; Robot assisted laparoscopic radical prostatectomy (N/A, 11/17/2023); and IR CV Line Injection (12/23/2023).   Significant Hospital Events: Including procedures, antibiotic start and stop dates in addition to other pertinent events   9/26> admit 9/27: 90% fio2 on ventilator. On diprivan  gtt, fent gtt, amio gtt. CVP 9.  AFebrile. Cutlure negative so far. cReat 5.13 and climbing (Baseline 0.91 on 8/292/25). AKI started 12/16/23. Normal lactate byu Ionized calcim is low. Seen by urology  & CCS- recommending  IR attemt of intrabdominal abscess that is in RLQ and tracks down bladder..  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 CCS does not think there is appendicit or diveritculits. And that SBO/Colitis is secondary to urinoma. Off levop and followed commands. aNURIC - 100cc only since admit 9/27 CT IMPRESSION: . There is focal irregularity at the junction of the bladder base and urethra with small anterior contrast pooling suggesting contained perforation. No intraperitoneal contrast extravasation. Appearance examination  is otherwise unchanged. Again demonstrated  re multiple loculated fluid collections in the abdomen and pelvis onsistent with abscesses. Wall thickening of the cecum and terminal ileum. Fluid-filled dilated small bowel appear mildly improved.  Consolidation or atelectasis in both lung bases, possibly pneumonia, compressive atelectasis, or aspiration. Small bilateral pleural effusions  . Wall thickening of the ascending colon and hepatic flexure, new since prior study. This may indicate colitis or inflammatory bowel disease versus reactive change. Dilated fluid-filled small bowel with air-fluid levels and mild diffuse wall thickening. Terminal ileum is decompressed. This is consistent with small-bowel obstruction, possibly due to inflammatory strictures. IR Procedure: DRAINAGE CATHETER PLACEMENT into a RIGHT LOWER QUADRANT FLUID COLLECTION ->> 1L drained HD CATH Placed CRRT started 9/28 - On vent. Ongoing CRRT. Seen by cCS  - still concernd about Urinoma and secondary blowe irritation and not a true obstruction NGT output is low c/w ileus. CCS does  NOT want enteral medication. Recommnding max only trophic feds. CUlture negative. Afebrile.  Stopped on Zyvox  and Zosyn  9/29 SBO Xr protocol per Gen Surgery with oral contrast pooling in the gastric fundus. MSSA in abdominal cultures and Enterobacter cloacae in Ucx - on Cefepime and Flagyl  SBT trial daiiled secondary to RR >40 and increased WOB. Back on PRVC 9/30 discontinued CRRT.  10/1 - switched to meropenem. Repeat CT A/P with stable intraabdominal abscesses. Started iHD   Interim History / Subjective:  Alert and oriented x 3.  Denies any pain.  No overnight events.  Ready for transfer out of ICU.  Objective    Blood pressure (!) 147/85, pulse 75, temperature 99.3 F (37.4 C), resp. rate (!) 34, height 5' 5 (1.651 m), weight 92.2 kg, SpO2 98%. CVP:  [8 mmHg-9 mmHg] 9 mmHg      Intake/Output Summary (Last 24 hours) at 12/24/2023 1130 Last data  filed at 12/24/2023 1100 Gross per 24 hour  Intake 1968.97 ml  Output 1680 ml  Net 288.97 ml  2L removed during iHD 10/2  Filed Weights   12/23/23 0500 12/24/23 0355 12/24/23 0745  Weight: 96.8 kg 92.2 kg 92.2 kg    Examination: General: Sedated and intubated.  Now extubated. Lungs: clear to auscultation bilaterally.  Heart: regular rate rhythm, no murmur appreciated.  Abdomen: Bowel sounds better with improvement in abdominal distention.  Diffusely tender to palpation Neuro: Successfully extubated.  Following commands and is alert and oriented.   Labs reviewed:  Assessment and Plan   Septic shock: Concern for infected urinoma though creatinine levels not consistent: Multiple abdominal abscess sites: MSSA positive Enterobacter cloacae UTI: Prostate cancer s/p robotic assisted laparoscopic radial prostatectomy on 11/17/23: - Status post pressors. - Was on cefepime and Flagyl  for UTI and abdominal abscesses.  Continue to spike fevers.  Both changed to meropenem. - Repeat CT abdomen showing stability or improvement in the abdominal abscesses. - Foley to remain in place; repeat cystogram ~10/7-10 per urology - Surgery and urology following.       Small bowel ileus: - Surgery following.   - Imaging consistent with bowel ileus. -Status post Reglan.  Now having a bowel movement. - Started on trickle feeds which got stopped when the NG tube came out. -Stopping TPN. -Advancing to clear liquid diet   Acute hypoxic and hypercapnic respiratory failure: Bilateral pneumonia likely bacterial: Moderate ARDS.  - Extubated on 10/3. - Antibiotics as above.  Starting   Metabolic encephalopathy-resolved: - Because of above. -Likely has underlying bipolar spectrum.   A-fib: VT/wide-complex tachycardia: - Status post cardioversion.  Was started on Amio  drip by cardiology.  Continue amiodarone  - Repeat echo reviewed.  EF 55% without wall motion abnormality.  -Cardiology following.    AKI: - Poor urine output post Lasix  challenge. - Renal following.  On intermittent HD. - Maintain Foley.  Repeat cystogram per urology between 7-10 October.   Hypoalbuminemia: - Likely a negative inflammatory marker. - Nutrition consult for feeding. - Starting trophic feeds. On TPN.    Deep tissue pressure injury: - noted on nursing evaluation of 10/1. Wound consult.   Acute on chronic anemia  Critical illness - Continue to monitor - Transfuse for Hgb <7  Will transfer out of ICU.   LABS    PULMONARY Recent Labs  Lab 12/19/23 1035 12/23/23 1129  PHART 7.383 7.374  PCO2ART 40.3 42.3  PO2ART 78* 93  HCO3 24.0 24.7  TCO2 25 26  O2SAT 95 97    CBC Recent Labs  Lab 12/21/23 0432 12/22/23 0359 12/23/23 0638 12/23/23 1129  HGB 10.5* 10.6* 11.3* 10.5*  HCT 32.0* 31.8* 35.1* 31.0*  WBC 16.9* 13.7* 11.0*  --   PLT 310 338 359  --     COAGULATION No results for input(s): INR in the last 168 hours.   CARDIAC  No results for input(s): TROPONINI in the last 168 hours. No results for input(s): PROBNP in the last 168 hours.   CHEMISTRY Recent Labs  Lab 12/20/23 0414 12/20/23 0848 12/21/23 0432 12/21/23 1829 12/22/23 0359 12/22/23 1527 12/23/23 0638 12/23/23 1129 12/24/23 0512  NA 131*   < > 132* 132* 132* 131* 133* 134* 134*  K 4.5   < > 5.6* 4.1 5.2* 5.4* 4.4 4.2 4.1  CL 97*   < > 101 95* 96* 92* 95*  --  98  CO2 21*   < > 20* 23 22 20* 23  --  19*  GLUCOSE 104*   < > 132* 123* 123* 128* 161*  --  202*  BUN 45*   < > 77* 50* 77* 103* 75*  --  113*  CREATININE 2.61*   < > 3.75* 2.88* 4.29* 5.21* 4.25*  --  5.92*  CALCIUM  7.7*   < > 7.7* 7.7* 7.6* 7.5* 7.7*  --  7.5*  MG 2.8*  --  2.8*  --  2.4  --  2.4  --  2.4  PHOS 3.3   < > 7.3* 4.7* 7.4* 9.8* 6.9*  --  6.4*   < > = values in this interval not displayed.   Estimated Creatinine Clearance: 14 mL/min (A) (by C-G formula based on SCr of 5.92 mg/dL (H)).   LIVER Recent Labs  Lab  12/19/23 0416 12/19/23 0417 12/20/23 0414 12/20/23 0848 12/21/23 1200 12/21/23 1829 12/22/23 0359 12/22/23 1527 12/23/23 0638 12/24/23 0512  AST 62*  --  44*  --  38  --  36  --   --  32  ALT 34  --  27  --  22  --  25  --   --  20  ALKPHOS 116  --  122  --  112  --  97  --   --  90  BILITOT 2.3*  --  1.7*  --  1.3*  --  1.0  --   --  0.7  PROT 5.6*  --  6.2*  --  6.1*  --  6.0*  --   --  6.0*  ALBUMIN  <1.5*   < > <1.5*  <1.5*   < > <1.5* <1.5* <1.5*  <1.5* <  1.5* 1.6* 1.6*   < > = values in this interval not displayed.     INFECTIOUS No results for input(s): LATICACIDVEN, PROCALCITON in the last 168 hours.    ENDOCRINE CBG (last 3)  Recent Labs    12/23/23 2345 12/24/23 0329 12/24/23 0724  GLUCAP 188* 195* 261*     IMAGING x48h  - image(s) personally visualized  -   highlighted in bold IR INJECT INDWELLING DRAINAGE CATHETER Result Date: 12/23/2023 INDICATION: Follow-up intra-abdominal abscess. Right abdominal abscess drain was placed on 12/17/2023. Recent CT demonstrates that the right abdominal abscess has decreased in size following drain placement. Evaluate for a bowel or urinary fistula. EXAM: DRAIN INJECTION WITH FLUOROSCOPY MEDICATIONS: None ANESTHESIA/SEDATION: None FLUOROSCOPY TIME:  Radiation Exposure Index (as provided by the fluoroscopic device): 42 mGy Kerma CONTRAST:  15 mL Omnipaque  300 COMPLICATIONS: None immediate. PROCEDURE: Patient was placed supine on the interventional table. Scout image was obtained. Drain was injected with contrast. Thick yellow fluid was aspirated and the drain was flushed with saline. Fluoroscopic images were taken and saved for this procedure. FINDINGS: Pigtail drain in the right lower quadrant of the abdomen. Contrast fills an irregular collection along the lateral aspect of the right lower abdomen. Contrast is preferentially filling a cavity cephalad to the drain. However, there is no evidence for a bowel or urinary fistula.  IMPRESSION: 1. No evidence for a bowel or urinary fistula. 2. Residual cavity along the cephalad aspect of the drain. Drain was kept in place. Electronically Signed   By: Juliene Balder M.D.   On: 12/23/2023 17:42   DG CHEST PORT 1 VIEW Result Date: 12/23/2023 CLINICAL DATA:  Dyspnea. EXAM: PORTABLE CHEST 1 VIEW COMPARISON:  December 21, 2023. FINDINGS: Stable cardiomediastinal silhouette. Endotracheal and nasogastric tubes are unchanged. Bilateral internal jugular catheters are unchanged. Hypoinflation of the lungs is noted. No significant consolidative process is noted. Bony thorax is unremarkable. Status post coronary bypass graft. IMPRESSION: Stable support apparatus. Hypoinflation of the lungs. Electronically Signed   By: Lynwood Landy Raddle M.D.   On: 12/23/2023 10:23   CRITICAL CARE Performed by: Sammi JONETTA Fredericks.     Total critical care time: 45 minutes   Critical care time was exclusive of separately billable procedures and treating other patients.   Critical care was necessary to treat or prevent imminent or life-threatening deterioration.   Critical care was time spent personally by me on the following activities: development of treatment plan with patient and/or surrogate as well as nursing, discussions with consultants, evaluation of patient's response to treatment, examination of patient, obtaining history from patient or surrogate, ordering and performing treatments and interventions, ordering and review of laboratory studies, ordering and review of radiographic studies, pulse oximetry, re-evaluation of patient's condition and participation in multidisciplinary rounds.  Sammi JONETTA Fredericks, MD Pulmonary, Critical Care and Sleep Attending.  Pager: [phone]  12/24/2023, 11:44 AM

## 2023-12-24 NOTE — Progress Notes (Signed)
 Patient ID: ROLF FELLS, male   DOB: 05-19-1965, 58 y.o.   MRN: [id number]      Subjective: BMs with flexiseal, HD setting up ROS negative except as listed above. Objective: Vital signs in last 24 hours: Temp:  [96.8 F (36 C)-100 F (37.8 C)] 99.6 F (37.6 C) (10/04 0745) Pulse Rate:  [65-112] 105 (10/04 0745) Resp:  [14-45] 36 (10/04 0745) BP: (115-186)/(74-152) 171/87 (10/04 0745) SpO2:  [86 %-99 %] 95 % (10/04 0745) FiO2 (%):  [40 %] 40 % (10/03 0935) Weight:  [92.2 kg] 92.2 kg (10/04 0745) Last BM Date : 12/24/23  Intake/Output from previous day: 10/03 0701 - 10/04 0700 In: 2152.4 [I.V.:2013.4; NG/GT:39; IV Piggyback:100] Out: 1680 [Urine:790; Emesis/NG output:300; Drains:590] Intake/Output this shift: No intake/output data recorded.  General appearance: alert and cooperative Resp: clear to auscultation bilaterally GI: distended, soft, NT  Lab Results: CBC  Recent Labs    12/22/23 0359 12/23/23 0638 12/23/23 1129  WBC 13.7* 11.0*  --   HGB 10.6* 11.3* 10.5*  HCT 31.8* 35.1* 31.0*  PLT 338 359  --    BMET Recent Labs    12/23/23 0638 12/23/23 1129 12/24/23 0512  NA 133* 134* 134*  K 4.4 4.2 4.1  CL 95*  --  98  CO2 23  --  19*  GLUCOSE 161*  --  202*  BUN 75*  --  113*  CREATININE 4.25*  --  5.92*  CALCIUM  7.7*  --  7.5*   PT/INR No results for input(s): LABPROT, INR in the last 72 hours. ABG Recent Labs    12/23/23 1129  PHART 7.374  HCO3 24.7    Studies/Results: IR INJECT INDWELLING DRAINAGE CATHETER Result Date: 12/23/2023 INDICATION: Follow-up intra-abdominal abscess. Right abdominal abscess drain was placed on 12/17/2023. Recent CT demonstrates that the right abdominal abscess has decreased in size following drain placement. Evaluate for a bowel or urinary fistula. EXAM: DRAIN INJECTION WITH FLUOROSCOPY MEDICATIONS: None ANESTHESIA/SEDATION: None FLUOROSCOPY TIME:  Radiation Exposure Index (as provided by the fluoroscopic device):  42 mGy Kerma CONTRAST:  15 mL Omnipaque  300 COMPLICATIONS: None immediate. PROCEDURE: Patient was placed supine on the interventional table. Scout image was obtained. Drain was injected with contrast. Thick yellow fluid was aspirated and the drain was flushed with saline. Fluoroscopic images were taken and saved for this procedure. FINDINGS: Pigtail drain in the right lower quadrant of the abdomen. Contrast fills an irregular collection along the lateral aspect of the right lower abdomen. Contrast is preferentially filling a cavity cephalad to the drain. However, there is no evidence for a bowel or urinary fistula. IMPRESSION: 1. No evidence for a bowel or urinary fistula. 2. Residual cavity along the cephalad aspect of the drain. Drain was kept in place. Electronically Signed   By: Juliene Balder M.D.   On: 12/23/2023 17:42   DG CHEST PORT 1 VIEW Result Date: 12/23/2023 CLINICAL DATA:  Dyspnea. EXAM: PORTABLE CHEST 1 VIEW COMPARISON:  December 21, 2023. FINDINGS: Stable cardiomediastinal silhouette. Endotracheal and nasogastric tubes are unchanged. Bilateral internal jugular catheters are unchanged. Hypoinflation of the lungs is noted. No significant consolidative process is noted. Bony thorax is unremarkable. Status post coronary bypass graft. IMPRESSION: Stable support apparatus. Hypoinflation of the lungs. Electronically Signed   By: Lynwood Landy Raddle M.D.   On: 12/23/2023 10:23    Anti-infectives: Anti-infectives (From admission, onward)    Start     Dose/Rate Route Frequency Ordered Stop   12/21/23 1600  meropenem (MERREM) 1  g in sodium chloride  0.9 % 100 mL IVPB        1 g 200 mL/hr over 30 Minutes Intravenous Every 24 hours 12/21/23 0853     12/19/23 1045  ceFEPIme (MAXIPIME) 2 g in sodium chloride  0.9 % 100 mL IVPB  Status:  Discontinued        2 g 200 mL/hr over 30 Minutes Intravenous Every 12 hours 12/19/23 0955 12/21/23 0843   12/19/23 1045  metroNIDAZOLE  (FLAGYL ) IVPB 500 mg  Status:   Discontinued        500 mg 100 mL/hr over 60 Minutes Intravenous Every 12 hours 12/19/23 0955 12/21/23 0843   12/17/23 1600  piperacillin -tazobactam (ZOSYN ) IVPB 2.25 g  Status:  Discontinued        2.25 g 100 mL/hr over 30 Minutes Intravenous Every 8 hours 12/17/23 0931 12/17/23 1144   12/17/23 1600  piperacillin -tazobactam (ZOSYN ) IVPB 3.375 g  Status:  Discontinued        3.375 g 100 mL/hr over 30 Minutes Intravenous Every 6 hours 12/17/23 1157 12/19/23 0955   12/17/23 1230  piperacillin -tazobactam (ZOSYN ) IVPB 3.375 g  Status:  Discontinued        3.375 g 100 mL/hr over 30 Minutes Intravenous Every 6 hours 12/17/23 1144 12/17/23 1157   12/17/23 1000  linezolid  (ZYVOX ) IVPB 600 mg  Status:  Discontinued        600 mg 300 mL/hr over 60 Minutes Intravenous Every 12 hours 12/16/23 1924 12/19/23 0958   12/17/23 0000  piperacillin -tazobactam (ZOSYN ) IVPB 3.375 g  Status:  Discontinued        3.375 g 12.5 mL/hr over 240 Minutes Intravenous Every 8 hours 12/16/23 1924 12/17/23 0931   12/16/23 1600  piperacillin -tazobactam (ZOSYN ) IVPB 3.375 g        3.375 g 12.5 mL/hr over 240 Minutes Intravenous  Once 12/16/23 1551 12/16/23 1602   12/16/23 1600  vancomycin  (VANCOREADY) IVPB 1750 mg/350 mL        1,750 mg 175 mL/hr over 120 Minutes Intravenous  Once 12/16/23 1551 12/16/23 1816       Assessment/Plan: ARTEMIS LOYAL is an 57 y.o. male with CAD (s/p CABG), afib, HTN, HLD, arrhythmias here following radical prostatectomy and lymph node dissection 11/17/2023 for prostate cancer - ileus resolving, try CL - fluid collections - drain injection by IR 12/24/2023 showed no fistula to bowel or bladder - CT cystogram shows contained leak at the anterior anastomosis; read Dr. Hosea note from 9/30; continue foley 7-10 days.  - s/p IR drainage of RLQ/pelvic collection 9/27 (Cx abundant staph); 300 mL - drain creatinine consistent with urinoma/bladder leak; foley is in place per urology  -  Broad-spectrum IV antibiotics as per critical care medicine AKI - HD per Nephrology  FEN: Ok for clear liquid diet today ID: Zosyn , zyvox  VTE: SCD's, SQH Dispo: ICU    LOS: 8 days    Dann Hummer, MD, MPH, FACS Trauma & General Surgery Use [email or web address] to contact on call provider

## 2023-12-24 NOTE — Progress Notes (Addendum)
 PHARMACY - TOTAL PARENTERAL NUTRITION CONSULT NOTE   Indication: Prolonged ileus  Patient Measurements: Height: 5' 5 (165.1 cm) Weight: 92.2 kg (203 lb 4.2 oz) IBW/kg (Calculated) : 61.5   Body mass index is 33.82 kg/m. Usual Weight: 96 kg  Assessment:  58 yo M with recent radial prostatectomy on 11/17/23 found to have complicated intra-abdominal infection with multiple abscesses. He underwent drain placement with IR on 12/17/23 into one of many fluid collections in the abdomen. Patient also has bladder leakage. The patient's course has been complicated by ileus as visualized on imaging and was now Day 6 of NPO status. Pharmacy consulted for TPN in this context.   Glucose / Insulin : A1c 6.0 - CBG 122-202, used 12 units SSI/24hrs   Electrolytes: Na 134, K 4.1 (none in TPN), CO2 19, CoCa 9.4, Phos 6.4 (none in TPN), others wnl  Renal: CRRT off 9/30; last iHD 10/2, next 10/4; BUN 113 up  Hepatic: LFTs WNL, Alb 1.6; recent high triglyceride d/t propofol  use (stopped 9/30): TG 785 (9/30) >> 279 (10/4) Intake / Output; MIVF: UOP 0.4 ml/kg/hr, NGT 300 mL, drain 590 mL, LBM 10/3 x4 -metoclopramide IV 10/2 >>  GI Imaging: 9/26 CT: multiple loculated fluid collections in the abdomen and pelvis consistent with abscesses 9/29 KUB: dilated air-filled loops of bowel 9/30 KUB: consistent with SBO 10/1 CT: Interval decompression of the right retroperitoneal fluid collection, irregular fluid collection in the RLQ, decreased pelvic mesentery fluid collections 10/2 KUB : unchanged distention, ileus GI Surgeries / Procedures:  9/27 RLE Drain placed   Central access: CVC TPN start date: 10/02  Nutritional Goals: Goal TPN rate is 65 mL/hr (provides 115 g of protein, 0g lipid, and 452g CHO for a total 2000 kcals per day) without lipids  Goal TPN rate is 70 mL/hr (provides 115 g of protein, 60g lipid, and 276g CHO for a total 2000 kcals per day) with lipids  RD Assessment: Estimated Needs Total  Energy Estimated Needs: 2000-2200 kcal/d Total Protein Estimated Needs: 110-125g/d Total Fluid Estimated Needs: 2L/d  Current Nutrition:  09/26 NPO  10/02 half TPN 10/03 full TPN + TF @ 19ml/hr 10/4 TPN + TF @ 20 ml/hr   Plan:  Continue TPN without lipids at goal 65 mL/hr at 1800 to provide ~100% of needs --remove lipids with recent documented hyperTG 2/2 propofol  usage. Pt has been off propofol  since 9/30 AM, trending down (anticipate can add back within the next few days) Electrolytes in TPN: increase Na 125 mEq/L, K 0 mEq/L, Ca 5 mEq/L, decrease Mg 2 mEq/L, and Phos 0 mmol/L. Change Cl:Acet to 1:1 Add standard MVI and trace elements to TPN Change to moderate q4h SSI and adjust as needed  Monitor TPN labs on Mon/Thurs, daily until stable F/u TF advancement, wean TPN as able  F/u TG, add lipids to TPN when <150   Thank you for allowing pharmacy to be a part of this patient's care.  Jinnie Door, PharmD, BCPS, BCCP Clinical Pharmacist  Please check AMION for all [facility] Pharmacy phone numbers After 10:00 PM, call Main Pharmacy [phone]

## 2023-12-24 NOTE — Progress Notes (Signed)
 Stop by for surgical visit to see Glenn Stewart now that he is extubated.  He seems rather subdued, which is quite a change from his behavior prior to his acute illness.  He seems to be improving, and I am grateful for all the great care that he has gotten in the ICU.  Will continue his catheter until Wednesday.  At that point it should be fine to come out.  Will be sure to schedule follow-up with him in our clinic once he is out of the hospital.

## 2023-12-24 NOTE — Progress Notes (Signed)
 Rounding Note   Patient Name: Glenn Stewart Date of Encounter: 12/24/2023  [facility] Cardiologist: Redell Shallow, MD   Subjective  This is a 58 year old male with past medical history of CAD s/p CABG x5 04/2022, hypertension, hyperlipidemia, paroxysmal atrial fibrillation admitted with sepsis secondary to urinoma and abdominal abscesses.  Underwent emergent DCCV in the ED for wide-complex tachycardia in the setting of multiple electrolyte derangements, favored to be VT, currently treated with IV amiodarone . Hospitalization complicated by SBO vs ileus as well as AKI requiring HD. Extubated 10/3  Overnight events: - NSVT at 12:56 am - Hypertensive - no other significant events  Doing well today without complaints.   Scheduled Meds:  amiodarone   150 mg Intravenous Once   bisacodyl   10 mg Rectal Daily   Chlorhexidine  Gluconate Cloth  6 each Topical Daily   diatrizoate meglumine-sodium  90 mL Per Tube Once   feeding supplement (PROSource TF20)  60 mL Per Tube Daily   Gerhardt's butt cream   Topical BID   heparin  injection (subcutaneous)  5,000 Units Subcutaneous Q8H   insulin  aspart  0-15 Units Subcutaneous Q4H   metoCLOPramide (REGLAN) injection  5 mg Intravenous Q6H   mouth rinse  15 mL Mouth Rinse Q2H   pantoprazole  (PROTONIX ) IV  40 mg Intravenous Daily   sodium chloride  flush  5 mL Intracatheter Q8H   Continuous Infusions:  amiodarone  30 mg/hr (12/24/23 0354)   dexmedetomidine  (PRECEDEX ) IV infusion Stopped (12/23/23 1457)   feeding supplement (VITAL 1.5 CAL) Stopped (12/23/23 1145)   fentaNYL  infusion INTRAVENOUS Stopped (12/20/23 1415)   ketamine  (KETALAR ) adult infusion 0.5 mg/kg/hr (12/23/23 1800)   meropenem (MERREM) IV Stopped (12/23/23 1657)   TPN ADULT (ION) 65 mL/hr at 12/23/23 1850   TPN ADULT (ION)     PRN Meds: HYDROmorphone  (DILAUDID ) injection, HYDROmorphone  (DILAUDID ) injection, mouth rinse   Vital Signs  Vitals:   12/24/23 0530 12/24/23  0545 12/24/23 0600 12/24/23 0615  BP: (!) 176/88 (!) 171/92 (!) 186/85 (!) 169/85  Pulse: (!) 104 (!) 104 (!) 107 (!) 105  Resp: (!) 25 (!) 37 (!) 37 18  Temp: 99.5 F (37.5 C) 99.5 F (37.5 C) 99.3 F (37.4 C) 99.1 F (37.3 C)  TempSrc: Bladder Bladder    SpO2: 95% 94% 96% 96%  Weight:      Height:        Intake/Output Summary (Last 24 hours) at 12/24/2023 0730 Last data filed at 12/24/2023 0600 Gross per 24 hour  Intake 2152.37 ml  Output 1680 ml  Net 472.37 ml      12/24/2023    3:55 AM 12/23/2023    5:00 AM 12/22/2023    5:00 AM  Last 3 Weights  Weight (lbs) 203 lb 4.2 oz 213 lb 6.5 oz 215 lb 9.8 oz  Weight (kg) 92.2 kg 96.8 kg 97.8 kg      Telemetry Sinus tachycardia with an episode of nonsustained VT x 17 beats at 00:56 - Personally Reviewed  ECG  No new tracings - Personally Reviewed  Physical Exam  GEN: awake and alert.   Cardiac: tachycardic, regular rhythm Respiratory: clear GI: bowel sounds auscultated MS: No edema; No deformity. Neuro:  Nonfocal   Labs High Sensitivity Troponin:   Recent Labs  Lab 12/16/23 1524 12/16/23 1839  TROPONINIHS 82* 95*     Chemistry Recent Labs  Lab 12/21/23 1200 12/21/23 1829 12/22/23 0359 12/22/23 1527 12/23/23 9361 12/23/23 1129 12/24/23 0512  NA  --    < >  132* 131* 133* 134* 134*  K  --    < > 5.2* 5.4* 4.4 4.2 4.1  CL  --    < > 96* 92* 95*  --  98  CO2  --    < > 22 20* 23  --  19*  GLUCOSE  --    < > 123* 128* 161*  --  202*  BUN  --    < > 77* 103* 75*  --  113*  CREATININE  --    < > 4.29* 5.21* 4.25*  --  5.92*  CALCIUM   --    < > 7.6* 7.5* 7.7*  --  7.5*  MG  --   --  2.4  --  2.4  --  2.4  PROT 6.1*  --  6.0*  --   --   --  6.0*  ALBUMIN  <1.5*   < > <1.5*  <1.5* <1.5* 1.6*  --  1.6*  AST 38  --  36  --   --   --  32  ALT 22  --  25  --   --   --  20  ALKPHOS 112  --  97  --   --   --  90  BILITOT 1.3*  --  1.0  --   --   --  0.7  GFRNONAA  --    < > 15* 12* 15*  --  10*  ANIONGAP  --    <  > 14 19* 15  --  17*   < > = values in this interval not displayed.    Lipids  Recent Labs  Lab 12/24/23 0512  TRIG 279*    Hematology Recent Labs  Lab 12/21/23 0432 12/22/23 0359 12/23/23 0638 12/23/23 1129  WBC 16.9* 13.7* 11.0*  --   RBC 3.93* 3.97* 4.31  --   HGB 10.5* 10.6* 11.3* 10.5*  HCT 32.0* 31.8* 35.1* 31.0*  MCV 81.4 80.1 81.4  --   MCH 26.7 26.7 26.2  --   MCHC 32.8 33.3 32.2  --   RDW 15.8* 15.4 15.5  --   PLT 310 338 359  --    Thyroid No results for input(s): TSH, FREET4 in the last 168 hours.  BNPNo results for input(s): BNP, PROBNP in the last 168 hours.  DDimer No results for input(s): DDIMER in the last 168 hours.   Radiology  IR INJECT INDWELLING DRAINAGE CATHETER Result Date: 12/23/2023 INDICATION: Follow-up intra-abdominal abscess. Right abdominal abscess drain was placed on 12/17/2023. Recent CT demonstrates that the right abdominal abscess has decreased in size following drain placement. Evaluate for a bowel or urinary fistula. EXAM: DRAIN INJECTION WITH FLUOROSCOPY MEDICATIONS: None ANESTHESIA/SEDATION: None FLUOROSCOPY TIME:  Radiation Exposure Index (as provided by the fluoroscopic device): 42 mGy Kerma CONTRAST:  15 mL Omnipaque  300 COMPLICATIONS: None immediate. PROCEDURE: Patient was placed supine on the interventional table. Scout image was obtained. Drain was injected with contrast. Thick yellow fluid was aspirated and the drain was flushed with saline. Fluoroscopic images were taken and saved for this procedure. FINDINGS: Pigtail drain in the right lower quadrant of the abdomen. Contrast fills an irregular collection along the lateral aspect of the right lower abdomen. Contrast is preferentially filling a cavity cephalad to the drain. However, there is no evidence for a bowel or urinary fistula. IMPRESSION: 1. No evidence for a bowel or urinary fistula. 2. Residual cavity along the cephalad aspect of the drain. Drain was kept  in place.  Electronically Signed   By: Juliene Balder M.D.   On: 12/23/2023 17:42   DG CHEST PORT 1 VIEW Result Date: 12/23/2023 CLINICAL DATA:  Dyspnea. EXAM: PORTABLE CHEST 1 VIEW COMPARISON:  December 21, 2023. FINDINGS: Stable cardiomediastinal silhouette. Endotracheal and nasogastric tubes are unchanged. Bilateral internal jugular catheters are unchanged. Hypoinflation of the lungs is noted. No significant consolidative process is noted. Bony thorax is unremarkable. Status post coronary bypass graft. IMPRESSION: Stable support apparatus. Hypoinflation of the lungs. Electronically Signed   By: Lynwood Landy Raddle M.D.   On: 12/23/2023 10:23   DG Abd Portable 1V Result Date: 12/22/2023 CLINICAL DATA:  Ileus. EXAM: PORTABLE ABDOMEN - 1 VIEW COMPARISON:  Most recent radiograph 12/20/2023, CT 12/21/2023 FINDINGS: Unchanged gaseous small bowel distention in the central abdomen. There is also air-filled transverse colon. Tip and side port of the enteric tube below the diaphragm in the stomach. Right lower quadrant drain remains in place. No evidence of free air. IMPRESSION: Unchanged gaseous small bowel in transverse colonic distention in the central abdomen, favoring ileus. Electronically Signed   By: Andrea Gasman M.D.   On: 12/22/2023 11:32    Cardiac Studies  Echo 12/17/23:  1. Left ventricular ejection fraction, by estimation, is 50 to 55%. The  left ventricle has low normal function. The left ventricle has no regional  wall motion abnormalities. Left ventricular diastolic parameters were  normal.   2. Right ventricular systolic function is normal. The right ventricular  size is mildly enlarged. Tricuspid regurgitation signal is inadequate for  assessing PA pressure.   3. The mitral valve is normal in structure. No evidence of mitral valve  regurgitation. No evidence of mitral stenosis.   4. The aortic valve is normal in structure. Aortic valve regurgitation is  not visualized. No aortic stenosis is present.    5. The inferior vena cava is normal in size with greater than 50%  respiratory variability, suggesting right atrial pressure of 3 mmHg.   Patient Profile    58 y.o. male   Assessment & Plan   Wide complex tachycardia - favor VT, currently sinus tachycardia with episodes of nonsustained VT - had sustained VT in the ER 12/16/23 requiring emergent cardioversion with IV Mg and IV amiodarone  --> resulting in Afib vs flutter, currently sinus tach - ultimately intubated and cardiology asked to evaluate for WCT - he remains on IV amiodarone , continue - agree with starting PO Lopressor   - continue to treat underlying metabolic derangements - once he has improved clinically, will discuss further  workup  CAD s/p CABG x 5 in 04/2022 Elevated troponin  - trop 82 --> 95 - will likely need ischemic eval once recovered clinically  Hypertension - continue HD   Post op atrial fibrillation - continuing amiodarone  as above - given brief nature of WCT, suspect VT rather than Afib with RVR - Eliquis  stopped in 2024,   Acute hypoxic/hypercarbic respiratory failure - extubated 10/3  AKI - nephrology following, failed Lasix  stress test. Undergoing intermittent HD Urinoma, abdominal abscess - urology following after recent radical prostatectomy Septic shock - per PCCM - off pressors Ileus - improving, surgery advancing diet       For questions or updates, please contact [facility] Please consult [email or web address] for contact info under       Signed, Emeline FORBES Calender, MD  12/24/2023, 7:30 AM

## 2023-12-25 DIAGNOSIS — I480 Paroxysmal atrial fibrillation: Secondary | ICD-10-CM | POA: Diagnosis not present

## 2023-12-25 DIAGNOSIS — C61 Malignant neoplasm of prostate: Secondary | ICD-10-CM

## 2023-12-25 DIAGNOSIS — E66812 Obesity, class 2: Secondary | ICD-10-CM

## 2023-12-25 DIAGNOSIS — N179 Acute kidney failure, unspecified: Secondary | ICD-10-CM | POA: Diagnosis not present

## 2023-12-25 DIAGNOSIS — G9341 Metabolic encephalopathy: Secondary | ICD-10-CM

## 2023-12-25 DIAGNOSIS — R579 Shock, unspecified: Secondary | ICD-10-CM | POA: Diagnosis not present

## 2023-12-25 DIAGNOSIS — K651 Peritoneal abscess: Secondary | ICD-10-CM

## 2023-12-25 DIAGNOSIS — I2510 Atherosclerotic heart disease of native coronary artery without angina pectoris: Secondary | ICD-10-CM

## 2023-12-25 DIAGNOSIS — J189 Pneumonia, unspecified organism: Secondary | ICD-10-CM

## 2023-12-25 DIAGNOSIS — I2583 Coronary atherosclerosis due to lipid rich plaque: Secondary | ICD-10-CM

## 2023-12-25 DIAGNOSIS — I10 Essential (primary) hypertension: Secondary | ICD-10-CM

## 2023-12-25 DIAGNOSIS — K567 Ileus, unspecified: Secondary | ICD-10-CM

## 2023-12-25 DIAGNOSIS — R000 Tachycardia, unspecified: Secondary | ICD-10-CM | POA: Diagnosis not present

## 2023-12-25 LAB — BASIC METABOLIC PANEL WITH GFR
Anion gap: 17 — ABNORMAL HIGH (ref 5–15)
BUN: 89 mg/dL — ABNORMAL HIGH (ref 6–20)
CO2: 23 mmol/L (ref 22–32)
Calcium: 7.8 mg/dL — ABNORMAL LOW (ref 8.9–10.3)
Chloride: 95 mmol/L — ABNORMAL LOW (ref 98–111)
Creatinine, Ser: 5.56 mg/dL — ABNORMAL HIGH (ref 0.61–1.24)
GFR, Estimated: 11 mL/min — ABNORMAL LOW (ref >60)
Glucose, Bld: 113 mg/dL — ABNORMAL HIGH (ref 70–99)
Potassium: 4.1 mmol/L (ref 3.5–5.1)
Sodium: 135 mmol/L (ref 135–145)

## 2023-12-25 LAB — GLUCOSE, CAPILLARY
Glucose-Capillary: 100 mg/dL — ABNORMAL HIGH (ref 70–99)
Glucose-Capillary: 112 mg/dL — ABNORMAL HIGH (ref 70–99)
Glucose-Capillary: 113 mg/dL — ABNORMAL HIGH (ref 70–99)
Glucose-Capillary: 99 mg/dL (ref 70–99)
Glucose-Capillary: 109 mg/dL — ABNORMAL HIGH (ref 70–99)

## 2023-12-25 LAB — PHOSPHORUS: Phosphorus: 6.4 mg/dL — ABNORMAL HIGH (ref 2.5–4.6)

## 2023-12-25 LAB — HEPATIC FUNCTION PANEL
ALT: 20 U/L (ref 0–44)
AST: 30 U/L (ref 15–41)
Albumin: 1.6 g/dL — ABNORMAL LOW (ref 3.5–5.0)
Alkaline Phosphatase: 82 U/L (ref 38–126)
Bilirubin, Direct: 0.3 mg/dL — ABNORMAL HIGH (ref 0.0–0.2)
Indirect Bilirubin: 0.6 mg/dL (ref 0.3–0.9)
Total Bilirubin: 0.9 mg/dL (ref 0.0–1.2)
Total Protein: 5.9 g/dL — ABNORMAL LOW (ref 6.5–8.1)

## 2023-12-25 LAB — TRIGLYCERIDES: Triglycerides: 237 mg/dL — ABNORMAL HIGH (ref <150)

## 2023-12-25 LAB — MAGNESIUM: Magnesium: 2.2 mg/dL (ref 1.7–2.4)

## 2023-12-25 MED ORDER — SODIUM CHLORIDE 0.9% FLUSH: 10.0000 mL | INTRAVENOUS | Status: DC | PRN

## 2023-12-25 MED ORDER — AMIODARONE HCL 200 MG PO TABS
400.0000 mg | ORAL_TABLET | Freq: Two times a day (BID) | ORAL | Status: DC
  Administered 2023-12-25 – 2023-12-27 (×5): 400 mg via ORAL
  Filled 2023-12-25 (×5): qty 2

## 2023-12-25 MED ORDER — SODIUM CHLORIDE 0.9% FLUSH
10.0000 mL | Freq: Two times a day (BID) | INTRAVENOUS | Status: DC
  Administered 2023-12-25: 10 mL
  Administered 2023-12-25: 20 mL
  Administered 2023-12-26 – 2024-01-02 (×13): 10 mL

## 2023-12-25 MED ORDER — ALTEPLASE 2 MG IJ SOLR
2.0000 mg | Freq: Once | INTRAMUSCULAR | Status: AC
  Administered 2023-12-25: 2 mg
  Filled 2023-12-25: qty 2

## 2023-12-25 MED ORDER — AMIODARONE HCL 200 MG PO TABS: 400.0000 mg | ORAL_TABLET | Freq: Every day | ORAL | Status: DC

## 2023-12-25 NOTE — Progress Notes (Signed)
 Progress Note   Patient: Glenn Stewart FMW:[id number] DOB: [birth year] DOA: 12/16/2023     9 DOS: the patient was seen and examined on 12/25/2023   Brief hospital course: Mr. Seabrooks was admitted to the hospital with intra abdominal abscess complicated with shock.   89 yoM with PMH of HTN, CAD, CABG x 5 04/2022, HTN, HLD, GERD, NASH, bipolar/ ADHD and s/p recent robotic assisted laparoscopic radial prostatectomy on 11/17/23 who presented for weakness and waxing mental status with distended and tender abdominal by EMS.  Has found with episodes of Afib with RVR and VT and was transported to the ED. In the ED he was hemodynamically unstable on arrival, underwent emergent cardioversion and given amiodarone  x2 and magnesium , with emerging rhythm of afib with RVR possible flutter.    Post cardioversion became hypotensive with respiratory distress therefore intubated and placed on norepinephrine .   Difficulty oxygenating post intubation with normal PIP requiring 1.0 and 12 PEEP.    Labs noted for K2.8, Na 129, bicarb 16, BUN/ sCr 55/ 3.92 (recently  ), t. Bili 3.4, AST 68, albumin  1.6, protein 3.4, INR 1.2.  CTH neg,   CT abdomen and pelvis with fluid filled distended small bowel with decompressed terminal ileum, consistent with small bowel obstruction.  Irregular wall thickening diffusely throughout the distal small bowel and right colon suggesting enterocolitis or inflammatory bowel disease.  Multiple loculated fluid collections, some with gas, demonstrated in the right abdomen and pelvis indicating abscess. This is likely due to perforation, possible related to inflammatory bowel disease or to an occult perforated appendicitis.  Consolidation versus compressive atelectasis in both lung bases. Small pleural effusions.   Patient placed on empiric zosyn / vancomycin .    NOTE:LD him as outpatient, patient self-irrigated catheter a couple days after initial surgery and then removed catheter himself  prior to follow-up at 1 week. Patient reported that he cut tubing prior to removal but unable to confirm if removal was truly atraumat   Patient was had NG tube place and TPN was started.   9/28 - On vent. Ongoing CRRT. Seen by surgery team - still concernd about Urinoma and secondary bowel irritation and not a true obstruction. NGT output is low c/w ileus. CCS does  NOT want enteral medication. Recommnding max only trophic feds. CUlture negative. Afebrile.  Stopped on Zyvox  and Zosyn  9/29 SBO Xr protocol per Gen Surgery with oral contrast pooling in the gastric fundus. MSSA in abdominal cultures and Enterobacter cloacae in Ucx - on Cefepime and Flagyl  SBT trial failed secondary to RR >40 and increased WOB. Back on PRVC 9/30 discontinued CRRT.  10/1 - switched to meropenem. Repeat CT A/P with stable intraabdominal abscesses. Started iHD 10/03 liberated from mechanical ventilation.   10/05 transfer to [facility].   Assessment and Plan: * Intra-abdominal abscess (HCC) Septic shock resolved.  Multiple abdominal abscesses due to MSSA.  Urinary tract infection due to enterobacter cloacae.  Initially placed on metronidazole  and cefepime, then because persistent fever has been changed to meropenem.  Follow up CT abdomen and pelvis with stable or improvement in abdominal abscess.   Follow up cell count is 11.0  Cultures from blood have been no growth.   Plan to continue antibiotic therapy with meropenem (started on 10/01).   Paroxysmal atrial fibrillation (HCC) Ventricular tachycardia sp cardioversion.  Patient has been placed on amiodarone  with good toleration.  Continue with metoprolol  tartrate 25 mg po bid.  Patient not on anticoagulation. Continue telemetry monitoring  Coronary artery disease No  chest pain, no acute coronary syndrome.   Essential hypertension Continue blood pressure control with amlodipine  and metoprolol .   Bilateral pneumonia (Present on admission)  Acute hypoxemic  and hypercapnic respiratory failure.  Moderate ARDS.  Required invasive mechanical ventilation.   Completed antibiotic therapy 02 saturation today is 94% on room air.   AKI (acute kidney injury) ATN. Hyponatremia hyperkalemia.  09/30 Stopped CRRT  10/01 started intermittent hemodialysis.   Urine output is 700 cc  Foley catheter in place  Serum cr is 5,5 with K at 4.1 and serum bicarbonate at 23  Plan for tunneled HD access on Tuesday.  Na 135 and Mg 2.2   Patient continue renal replacement therapy with hemodialysis.   Ileus (HCC) Patient has been weaned off TPN and now tolerating po well.  Ileus has resolved, advanced to clear liquids today.   Acute metabolic encephalopathy Mentation has been improving, today continue with mild confusion but not agitation. Continue nutritional support, PT and OT.  Neuro checks per unit protocol.   Prostate cancer [facility]) Sp robotic assisted laparoscopic prostatectomy on 11/17/23 Continue foley catheter, plan to repeat cystogram 10/7 to 10/10 per urology.    Class 2 obesity with body mass index (BMI) of 35 to 39.9 without comorbidity Calculated BMI is 33.0         Subjective: patient is feeling better, tolerating po well, no dyspnea, chest pain, no nausea or vomiting. Continue very weak and deconditioned   Physical Exam: Vitals:   12/24/23 2358 12/25/23 0449 12/25/23 0754 12/25/23 1113  BP: (!) 151/76 (!) 140/73 (!) 145/80 (!) 151/84  Pulse: 98 84 90 86  Resp: 18 (!) 24 (!) 22 (!) 21  Temp: 98 F (36.7 C) 98.2 F (36.8 C) 98.4 F (36.9 C) 98 F (36.7 C)  TempSrc: Oral Oral Oral Oral  SpO2: 97% 97% 96% 94%  Weight:      Height:       Neurology awake and alert ENT with mild pallor Cardiovascular with S1 and S2 present and regular with no gallops, rubs or murmurs No JVD Respiratory with mild rales at bases with no wheezing or rhonchi, poor inspiratory effort, on anterior auscultation  Abdomen with mild distention, soft and  non tender No lower extremity edema Foley and rectal tube in place   Data Reviewed:    Family Communication: I spoke with patient's sister and mother at the bedside, we talked in detail about patient's condition, plan of care and prognosis and all questions were addressed.   Disposition: Status is: Inpatient Remains inpatient appropriate because: recovering abdominal sepsis.   Planned Discharge Destination: to be determined      Author: Elidia Toribio Furnace, MD 12/25/2023 3:03 PM  For on call review [email or web address].

## 2023-12-25 NOTE — Assessment & Plan Note (Addendum)
 Prior to 12/28/23 Patient has been weaned off TPN and now tolerating po well.  Ileus has resolved, advanced to soft today.  Surgery has signed off 12-26-2023

## 2023-12-25 NOTE — Assessment & Plan Note (Signed)
 Septic shock resolved.  Multiple abdominal abscesses due to MSSA.  Urinary tract infection due to enterobacter cloacae.  Initially placed on metronidazole  and cefepime, then because persistent fever has been changed to meropenem.  Follow up CT abdomen and pelvis with stable or improvement in abdominal abscess.   Follow up cell count is 11.0  Cultures from blood have been no growth.   Plan to continue antibiotic therapy with meropenem (started on 10/01).  Will plan for now to continue antibiotic therapy until 10/08

## 2023-12-25 NOTE — Assessment & Plan Note (Signed)
 No chest pain, no acute coronary syndrome.

## 2023-12-25 NOTE — Plan of Care (Signed)
  Problem: Education: Goal: Knowledge of the procedure and recovery process will improve Outcome: Progressing   Problem: Pain Management: Goal: General experience of comfort will improve Outcome: Progressing   Problem: Urinary Elimination: Goal: Ability to avoid or minimize complications of infection will improve Outcome: Progressing   Problem: Education: Goal: Knowledge of General Education information will improve Description: Including pain rating scale, medication(s)/side effects and non-pharmacologic comfort measures Outcome: Progressing   Problem: Nutrition: Goal: Adequate nutrition will be maintained Outcome: Progressing   Problem: Safety: Goal: Ability to remain free from injury will improve Outcome: Progressing

## 2023-12-25 NOTE — Progress Notes (Signed)
 Subjective/Chief Complaint: No complaints. Reports having bm's   Objective: Vital signs in last 24 hours: Temp:  [98 F (36.7 C)-99.5 F (37.5 C)] 98.4 F (36.9 C) (10/05 0754) Pulse Rate:  [70-109] 90 (10/05 0754) Resp:  [16-36] 22 (10/05 0754) BP: (131-175)/(72-121) 145/80 (10/05 0754) SpO2:  [90 %-100 %] 96 % (10/05 0754) Weight:  [90 kg] 90 kg (10/04 1215) Last BM Date : 12/24/23  Intake/Output from previous day: 10/04 0701 - 10/05 0700 In: 1149 [P.O.:360; I.V.:789] Out: 3575 [Urine:700; Drains:200; Stool:675] Intake/Output this shift: No intake/output data recorded.  General appearance: alert and cooperative Resp: clear to auscultation bilaterally Cardio: regular rate and rhythm GI: soft, nontender  Lab Results:  Recent Labs    12/23/23 0638 12/23/23 1129  WBC 11.0*  --   HGB 11.3* 10.5*  HCT 35.1* 31.0*  PLT 359  --    BMET Recent Labs    12/24/23 0512 12/25/23 0630  NA 134* 135  K 4.1 4.1  CL 98 95*  CO2 19* 23  GLUCOSE 202* 113*  BUN 113* 89*  CREATININE 5.92* 5.56*  CALCIUM  7.5* 7.8*   PT/INR No results for input(s): LABPROT, INR in the last 72 hours. ABG Recent Labs    12/23/23 1129  PHART 7.374  HCO3 24.7    Studies/Results: IR INJECT INDWELLING DRAINAGE CATHETER Result Date: 12/23/2023 INDICATION: Follow-up intra-abdominal abscess. Right abdominal abscess drain was placed on 12/17/2023. Recent CT demonstrates that the right abdominal abscess has decreased in size following drain placement. Evaluate for a bowel or urinary fistula. EXAM: DRAIN INJECTION WITH FLUOROSCOPY MEDICATIONS: None ANESTHESIA/SEDATION: None FLUOROSCOPY TIME:  Radiation Exposure Index (as provided by the fluoroscopic device): 42 mGy Kerma CONTRAST:  15 mL Omnipaque  300 COMPLICATIONS: None immediate. PROCEDURE: Patient was placed supine on the interventional table. Scout image was obtained. Drain was injected with contrast. Thick yellow fluid was aspirated and  the drain was flushed with saline. Fluoroscopic images were taken and saved for this procedure. FINDINGS: Pigtail drain in the right lower quadrant of the abdomen. Contrast fills an irregular collection along the lateral aspect of the right lower abdomen. Contrast is preferentially filling a cavity cephalad to the drain. However, there is no evidence for a bowel or urinary fistula. IMPRESSION: 1. No evidence for a bowel or urinary fistula. 2. Residual cavity along the cephalad aspect of the drain. Drain was kept in place. Electronically Signed   By: Juliene Balder M.D.   On: 12/23/2023 17:42   DG CHEST PORT 1 VIEW Result Date: 12/23/2023 CLINICAL DATA:  Dyspnea. EXAM: PORTABLE CHEST 1 VIEW COMPARISON:  December 21, 2023. FINDINGS: Stable cardiomediastinal silhouette. Endotracheal and nasogastric tubes are unchanged. Bilateral internal jugular catheters are unchanged. Hypoinflation of the lungs is noted. No significant consolidative process is noted. Bony thorax is unremarkable. Status post coronary bypass graft. IMPRESSION: Stable support apparatus. Hypoinflation of the lungs. Electronically Signed   By: Lynwood Landy Raddle M.D.   On: 12/23/2023 10:23    Anti-infectives: Anti-infectives (From admission, onward)    Start     Dose/Rate Route Frequency Ordered Stop   12/21/23 1600  meropenem (MERREM) 1 g in sodium chloride  0.9 % 100 mL IVPB        1 g 200 mL/hr over 30 Minutes Intravenous Every 24 hours 12/21/23 0853     12/19/23 1045  ceFEPIme (MAXIPIME) 2 g in sodium chloride  0.9 % 100 mL IVPB  Status:  Discontinued        2 g  200 mL/hr over 30 Minutes Intravenous Every 12 hours 12/19/23 0955 12/21/23 0843   12/19/23 1045  metroNIDAZOLE  (FLAGYL ) IVPB 500 mg  Status:  Discontinued        500 mg 100 mL/hr over 60 Minutes Intravenous Every 12 hours 12/19/23 0955 12/21/23 0843   12/17/23 1600  piperacillin -tazobactam (ZOSYN ) IVPB 2.25 g  Status:  Discontinued        2.25 g 100 mL/hr over 30 Minutes Intravenous  Every 8 hours 12/17/23 0931 12/17/23 1144   12/17/23 1600  piperacillin -tazobactam (ZOSYN ) IVPB 3.375 g  Status:  Discontinued        3.375 g 100 mL/hr over 30 Minutes Intravenous Every 6 hours 12/17/23 1157 12/19/23 0955   12/17/23 1230  piperacillin -tazobactam (ZOSYN ) IVPB 3.375 g  Status:  Discontinued        3.375 g 100 mL/hr over 30 Minutes Intravenous Every 6 hours 12/17/23 1144 12/17/23 1157   12/17/23 1000  linezolid  (ZYVOX ) IVPB 600 mg  Status:  Discontinued        600 mg 300 mL/hr over 60 Minutes Intravenous Every 12 hours 12/16/23 1924 12/19/23 0958   12/17/23 0000  piperacillin -tazobactam (ZOSYN ) IVPB 3.375 g  Status:  Discontinued        3.375 g 12.5 mL/hr over 240 Minutes Intravenous Every 8 hours 12/16/23 1924 12/17/23 0931   12/16/23 1600  piperacillin -tazobactam (ZOSYN ) IVPB 3.375 g        3.375 g 12.5 mL/hr over 240 Minutes Intravenous  Once 12/16/23 1551 12/16/23 1602   12/16/23 1600  vancomycin  (VANCOREADY) IVPB 1750 mg/350 mL        1,750 mg 175 mL/hr over 120 Minutes Intravenous  Once 12/16/23 1551 12/16/23 1816       Assessment/Plan: s/p * No surgery found * Ileus seems to be resolving. Advance diet Glenn Stewart is an 58 y.o. male with CAD (s/p CABG), afib, HTN, HLD, arrhythmias here following radical prostatectomy and lymph node dissection 11/17/2023 for prostate cancer - ileus resolving, advance diet - fluid collections - drain injection by IR 12/24/2023 showed no fistula to bowel or bladder - CT cystogram shows contained leak at the anterior anastomosis; read Dr. Hosea note from 9/30; continue foley 7-10 days.  - s/p IR drainage of RLQ/pelvic collection 9/27 (Cx abundant staph); 300 mL - drain creatinine consistent with urinoma/bladder leak; foley is in place per urology  - Broad-spectrum IV antibiotics as per critical care medicine AKI - HD per Nephrology  FEN: Ok for clear liquid diet today ID: Zosyn , zyvox  VTE: SCD's, SQH  LOS: 9 days    Deward Null III 12/25/2023

## 2023-12-25 NOTE — Assessment & Plan Note (Addendum)
 Prior to 12/28/23 (Present on admission)  Acute hypoxemic and hypercapnic respiratory failure.  Moderate ARDS.  Required invasive mechanical ventilation.   Completed antibiotic therapy 02 saturation today is 100% on room air.   12/28/23 resolved.

## 2023-12-25 NOTE — Assessment & Plan Note (Signed)
 Prior to 12/28/23 Mentation has been improving, continue with mild confusion but not agitation. Continue nutritional support, PT and OT.  Neuro checks per unit protocol.   12/28/23 resolved.

## 2023-12-25 NOTE — Progress Notes (Signed)
 Admit: 12/16/2023 LOS: 9  3M with AKI after presenting with encephalopathy, A-fib with RVR/ventricular tachycardia, found to have likely abdominal urinomas with abscess, now requiring RRT TTS with some signs of renal recovery.    Subjective:  HD yesterday, 3.5 hours, UF 2 L, stable. Transferred out of ICU. No major events reported overnight.. Afebrile, blood pressure somewhat improved..  On amiodarone .  On room air. UOP 700 mL. Seen this morning eating breakfast.  Overall doing much better.   10/04 0701 - 10/05 0700 In: 1149 [P.O.:360; I.V.:789] Out: 3575 [Urine:700; Drains:200; Stool:675]  Filed Weights   12/24/23 0355 12/24/23 0745 12/24/23 1215  Weight: 92.2 kg 92.2 kg 90 kg    Scheduled Meds:  (feeding supplement) PROSource Plus  30 mL Oral TID AC   amiodarone   150 mg Intravenous Once   amLODipine   5 mg Oral Daily   bisacodyl   10 mg Rectal Daily   Chlorhexidine  Gluconate Cloth  6 each Topical Daily   feeding supplement  1 Container Oral TID BM   Gerhardt's butt cream   Topical BID   heparin  injection (subcutaneous)  5,000 Units Subcutaneous Q8H   insulin  aspart  0-15 Units Subcutaneous TID WC   metoprolol  tartrate  25 mg Oral BID   multivitamin  1 tablet Oral QHS   mouth rinse  15 mL Mouth Rinse Q2H   sodium chloride  flush  10-40 mL Intracatheter Q12H   sodium chloride  flush  5 mL Intracatheter Q8H   thiamine  100 mg Oral Daily   Continuous Infusions:  amiodarone  30 mg/hr (12/25/23 0526)   meropenem (MERREM) IV Stopped (12/23/23 1657)   PRN Meds:.mouth rinse, sodium chloride  flush  Current Labs: reviewed    Physical Exam:  Blood pressure (!) 140/73, pulse 84, temperature 98.2 F (36.8 C), temperature source Oral, resp. rate (!) 24, height 5' 5 (1.651 m), weight 90 kg, SpO2 97%.  GEN: NAD, on RA ENT: LIJ CV: Regular, normal S1 and S2 PULM: BLBS ABD: soft EXT: no LEE  A AKI, anuric, baseline normal GFR.  No evidence of obstruction on imaging at  presentation.  Suspect ATN.  Repeat bladder pressure not consistent with abdominal compartment syndrome on 9/27.  CT A/P w contrast, 10/1: Renal unremarkable. Now making some urine.  Hyponatremia, resolved Hyperkalemia: Likely due to renal failure.  Resolved Abdominal fluid collections concerning for urinoma after radical prostatectomy 11/14/2023; status post drain placement 9/27 by IR with more than 1 L output since placement Ileus versus SBO, surgery following, ileus favored VDRF A-fib with RVR and V. tach at presentation, cardiology following on amiodarone  Septic shock on linezolid  and Zosyn , Off pressors, improving Hypertension: On amlodipine  5 daily, Lopressor  25 BID. Hyperphosphatemia setting of AKI, improved with HD Metabolic acidosis, improved on HD. Recurrent fevers, improved   P Stopped CRRT 9/30. Failed Lasix  stress test.  1st HD, 10/1, last 10/4 Assess for HD Tuesday Daily weights, Daily Renal Panel, Strict I/Os, Avoid nephrotoxins (NSAIDs, judicious IV Contrast)  Medication Issues; Preferred narcotic agents for pain control are hydromorphone , fentanyl , and methadone. Morphine  should not be used.  Baclofen should be avoided Avoid oral sodium phosphate  and magnesium  citrate based laxatives / bowel preps   Discussed with primary team  Dr. Evalene HERO Chinenye Katzenberger  12/25/2023, 7:40 AM  Recent Labs  Lab 12/23/23 9361 12/23/23 1129 12/24/23 0512 12/25/23 0630  NA 133* 134* 134* 135  K 4.4 4.2 4.1 4.1  CL 95*  --  98 95*  CO2 23  --  19*  23  GLUCOSE 161*  --  202* 113*  BUN 75*  --  113* 89*  CREATININE 4.25*  --  5.92* 5.56*  CALCIUM  7.7*  --  7.5* 7.8*  PHOS 6.9*  --  6.4* 6.4*   Recent Labs  Lab 12/20/23 0414 12/21/23 0432 12/22/23 0359 12/23/23 0638 12/23/23 1129  WBC 13.8* 16.9* 13.7* 11.0*  --   NEUTROABS 10.9* 13.5* 10.7*  --   --   HGB 10.6* 10.5* 10.6* 11.3* 10.5*  HCT 31.4* 32.0* 31.8* 35.1* 31.0*  MCV 80.3 81.4 80.1 81.4  --   PLT 281 310 338 359  --

## 2023-12-25 NOTE — Assessment & Plan Note (Addendum)
 Ventricular tachycardia sp cardioversion.  Patient has been placed on amiodarone  with good toleration.  Continue with metoprolol  tartrate 25 mg po bid.  Patient not on anticoagulation. Continue telemetry monitoring EP has been consulted.

## 2023-12-25 NOTE — Assessment & Plan Note (Signed)
>>  ASSESSMENT AND PLAN FOR PAF (PAROXYSMAL ATRIAL FIBRILLATION) (HCC) WRITTEN ON 12/28/2023  1:46 PM BY Hershell Brandl, DO  Prior to 12/28/23 Ventricular tachycardia sp cardioversion.   Continue with carvedilol 12.5 mg po bid.  Patient not on anticoagulation. Continue telemetry monitoring EP has been consulted with recommendations to stop amiodarone  and continue close monitoring.   12/28/23 cards/EP felt that pt's VT was due to his sepsis. Amio stopped. On coreg 12.5 mg bid. Ziopatch ordered to be placed prior to discharge.

## 2023-12-25 NOTE — Assessment & Plan Note (Addendum)
 Prior to 12/28/23  ATN. Hyponatremia hyperkalemia.  09/30 Stopped CRRT  10/01 started intermittent hemodialysis.   Urine output is 2250 cc (foley in place)  BUN 143, cr 7,44, with K at 4.7 and serum bicarbonate at 17  Na 130   Plan for tunneled HD access.    Patient continue renal replacement therapy with hemodialysis, per nephrology recommendations   12/29/23 remains on HD. Showing some signs of renal recovery. Produced 3.4L of urine. He now has right internal jugular permcath. Renal coordinator trying to figure out where pt is going to get outpt HD if he goes to [place] and lives with his mother.

## 2023-12-25 NOTE — Progress Notes (Signed)
 eLink Physician-Brief Progress Note Patient Name: Glenn Stewart DOB: [birth year] MRN: [id number]   Date of Service  12/25/2023  HPI/Events of Note  Request for Alteplase to unclog the pigtail on his HD cath  eICU Interventions  Ordered     Intervention Category Minor Interventions: Routine modifications to care plan (e.g. PRN medications for pain, fever)  Antiono Ettinger 12/25/2023, 2:58 AM

## 2023-12-25 NOTE — Assessment & Plan Note (Signed)
Body mass index is 34.27 kg/m.

## 2023-12-25 NOTE — Hospital Course (Addendum)
 [name] was admitted to the hospital with intra abdominal abscess complicated with shock.   70 yoM with PMH of HTN, CAD, CABG x 5 04/2022, HTN, HLD, GERD, NASH, bipolar/ ADHD and s/p recent robotic assisted laparoscopic radial prostatectomy on 11/17/23 who presented for weakness and waxing mental status with distended and tender abdominal by EMS.  Has found with episodes of Afib with RVR and VT and was transported to the ED. In the ED he was hemodynamically unstable on arrival, underwent emergent cardioversion and given amiodarone  x2 and magnesium , with emerging rhythm of afib with RVR possible flutter.    Post cardioversion became hypotensive with respiratory distress therefore intubated and placed on norepinephrine .   Difficulty oxygenating post intubation with normal PIP requiring 1.0 and 12 PEEP.    Labs noted for K2.8, Na 129, bicarb 16, BUN/ sCr 55/ 3.92 (recently  ), t. Bili 3.4, AST 68, albumin  1.6, protein 3.4, INR 1.2.  CTH neg,   CT abdomen and pelvis with fluid filled distended small bowel with decompressed terminal ileum, consistent with small bowel obstruction.  Irregular wall thickening diffusely throughout the distal small bowel and right colon suggesting enterocolitis or inflammatory bowel disease.  Multiple loculated fluid collections, some with gas, demonstrated in the right abdomen and pelvis indicating abscess. This is likely due to perforation, possible related to inflammatory bowel disease or to an occult perforated appendicitis.  Consolidation versus compressive atelectasis in both lung bases. Small pleural effusions.   Patient placed on empiric zosyn / vancomycin .    NOTE:LD him as outpatient, patient self-irrigated catheter a couple days after initial surgery and then removed catheter himself prior to follow-up at 1 week. Patient reported that he cut tubing prior to removal but unable to confirm if removal was truly atraumat   Patient was had NG tube place and TPN  was started.         09/27 s/p IR drainage of RLQ/pelvic collection (Cx abundant staph); 300 mL  9/28 - On vent. Ongoing CRRT. Seen by surgery team - still concernd about Urinoma and secondary bowel irritation and not a true obstruction. NGT output is low c/w ileus. CCS does  NOT want enteral medication. Recommnding max only trophic feds. CUlture negative. Afebrile.  Stopped on Zyvox  and Zosyn  9/29 SBO Xr protocol per Gen Surgery with oral contrast pooling in the gastric fundus. MSSA in abdominal cultures and Enterobacter cloacae in Ucx - on Cefepime and Flagyl  SBT trial failed secondary to RR >40 and increased WOB. Back on PRVC 9/30 discontinued CRRT.  10/1 - switched to meropenem. Repeat CT A/P with stable intraabdominal abscesses. Started iHD 10/03 liberated from mechanical ventilation.   10/05 transfer to [facility].  10/06 plan for HD tunneled catheter tomorrow

## 2023-12-25 NOTE — Assessment & Plan Note (Signed)
 Sp robotic assisted laparoscopic prostatectomy on 11/17/23 Continue foley catheter, plan to repeat cystogram 10/7 to 10/10 per urology.

## 2023-12-25 NOTE — Assessment & Plan Note (Signed)
 Continue blood pressure control with amlodipine  and metoprolol .

## 2023-12-25 NOTE — Plan of Care (Signed)
  Problem: Education: Goal: Knowledge of the procedure and recovery process will improve Outcome: Progressing   Problem: Bowel/Gastric: Goal: Gastrointestinal status for postoperative course will improve Outcome: Progressing   Problem: Pain Management: Goal: General experience of comfort will improve Outcome: Progressing

## 2023-12-25 NOTE — Progress Notes (Signed)
 Rounding Note   Patient Name: Glenn Stewart Date of Encounter: 12/25/2023  [facility] Cardiologist: Redell Shallow, MD   Subjective  This is a 58 year old male with past medical history of CAD s/p CABG x5 04/2022, hypertension, hyperlipidemia, paroxysmal atrial fibrillation admitted with sepsis secondary to urinoma and abdominal abscesses.  Underwent emergent DCCV in the ED for wide-complex tachycardia in the setting of multiple electrolyte derangements, favored to be VT, currently treated with IV amiodarone . Hospitalization complicated by SBO vs ileus as well as AKI requiring HD. Extubated 10/3  No significant overnight events.   Doing well today no complaints.   Scheduled Meds:  (feeding supplement) PROSource Plus  30 mL Oral TID AC   amiodarone   150 mg Intravenous Once   amLODipine   5 mg Oral Daily   bisacodyl   10 mg Rectal Daily   Chlorhexidine  Gluconate Cloth  6 each Topical Daily   feeding supplement  1 Container Oral TID BM   Gerhardt's butt cream   Topical BID   heparin  injection (subcutaneous)  5,000 Units Subcutaneous Q8H   insulin  aspart  0-15 Units Subcutaneous TID WC   metoprolol  tartrate  25 mg Oral BID   multivitamin  1 tablet Oral QHS   sodium chloride  flush  10-40 mL Intracatheter Q12H   sodium chloride  flush  5 mL Intracatheter Q8H   thiamine  100 mg Oral Daily   Continuous Infusions:  amiodarone  30 mg/hr (12/25/23 0526)   meropenem (MERREM) IV Stopped (12/23/23 1657)   PRN Meds: mouth rinse, sodium chloride  flush   Vital Signs  Vitals:   12/24/23 2003 12/24/23 2358 12/25/23 0449 12/25/23 0754  BP: (!) 158/77 (!) 151/76 (!) 140/73 (!) 145/80  Pulse: 97 98 84 90  Resp: (!) 31 18 (!) 24 (!) 22  Temp: 98 F (36.7 C) 98 F (36.7 C) 98.2 F (36.8 C) 98.4 F (36.9 C)  TempSrc: Oral Oral Oral Oral  SpO2: 95% 97% 97% 96%  Weight:      Height:        Intake/Output Summary (Last 24 hours) at 12/25/2023 0908 Last data filed at 12/25/2023  0526 Gross per 24 hour  Intake 979.43 ml  Output 3575 ml  Net -2595.57 ml      12/24/2023   12:15 PM 12/24/2023    7:45 AM 12/24/2023    3:55 AM  Last 3 Weights  Weight (lbs) 198 lb 6.6 oz 203 lb 4.2 oz 203 lb 4.2 oz  Weight (kg) 90 kg 92.2 kg 92.2 kg      Telemetry Sinus tachycardia - Personally Reviewed  ECG  No new tracings - Personally Reviewed  Physical Exam Vitals and nursing note reviewed.  Constitutional:      Appearance: Normal appearance.  HENT:     Head: Normocephalic and atraumatic.  Eyes:     Conjunctiva/sclera: Conjunctivae normal.  Neck:     Vascular: No carotid bruit.  Cardiovascular:     Rate and Rhythm: Normal rate and regular rhythm.  Pulmonary:     Effort: Pulmonary effort is normal.     Breath sounds: Normal breath sounds.  Musculoskeletal:        General: No swelling or tenderness.  Skin:    Coloration: Skin is not jaundiced or pale.  Neurological:     Mental Status: He is alert.      Labs High Sensitivity Troponin:   Recent Labs  Lab 12/16/23 1524 12/16/23 1839  TROPONINIHS 82* 95*     Chemistry Recent  Labs  Lab 12/22/23 0359 12/22/23 1527 12/23/23 0638 12/23/23 1129 12/24/23 0512 12/25/23 0630  NA 132*   < > 133* 134* 134* 135  K 5.2*   < > 4.4 4.2 4.1 4.1  CL 96*   < > 95*  --  98 95*  CO2 22   < > 23  --  19* 23  GLUCOSE 123*   < > 161*  --  202* 113*  BUN 77*   < > 75*  --  113* 89*  CREATININE 4.29*   < > 4.25*  --  5.92* 5.56*  CALCIUM  7.6*   < > 7.7*  --  7.5* 7.8*  MG 2.4  --  2.4  --  2.4 2.2  PROT 6.0*  --   --   --  6.0* 5.9*  ALBUMIN  <1.5*  <1.5*   < > 1.6*  --  1.6* 1.6*  AST 36  --   --   --  32 30  ALT 25  --   --   --  20 20  ALKPHOS 97  --   --   --  90 82  BILITOT 1.0  --   --   --  0.7 0.9  GFRNONAA 15*   < > 15*  --  10* 11*  ANIONGAP 14   < > 15  --  17* 17*   < > = values in this interval not displayed.    Lipids  Recent Labs  Lab 12/25/23 0630  TRIG 237*    Hematology Recent Labs   Lab 12/21/23 0432 12/22/23 0359 12/23/23 0638 12/23/23 1129  WBC 16.9* 13.7* 11.0*  --   RBC 3.93* 3.97* 4.31  --   HGB 10.5* 10.6* 11.3* 10.5*  HCT 32.0* 31.8* 35.1* 31.0*  MCV 81.4 80.1 81.4  --   MCH 26.7 26.7 26.2  --   MCHC 32.8 33.3 32.2  --   RDW 15.8* 15.4 15.5  --   PLT 310 338 359  --    Thyroid No results for input(s): TSH, FREET4 in the last 168 hours.  BNPNo results for input(s): BNP, PROBNP in the last 168 hours.  DDimer No results for input(s): DDIMER in the last 168 hours.   Radiology  IR INJECT INDWELLING DRAINAGE CATHETER Result Date: 12/23/2023 INDICATION: Follow-up intra-abdominal abscess. Right abdominal abscess drain was placed on 12/17/2023. Recent CT demonstrates that the right abdominal abscess has decreased in size following drain placement. Evaluate for a bowel or urinary fistula. EXAM: DRAIN INJECTION WITH FLUOROSCOPY MEDICATIONS: None ANESTHESIA/SEDATION: None FLUOROSCOPY TIME:  Radiation Exposure Index (as provided by the fluoroscopic device): 42 mGy Kerma CONTRAST:  15 mL Omnipaque  300 COMPLICATIONS: None immediate. PROCEDURE: Patient was placed supine on the interventional table. Scout image was obtained. Drain was injected with contrast. Thick yellow fluid was aspirated and the drain was flushed with saline. Fluoroscopic images were taken and saved for this procedure. FINDINGS: Pigtail drain in the right lower quadrant of the abdomen. Contrast fills an irregular collection along the lateral aspect of the right lower abdomen. Contrast is preferentially filling a cavity cephalad to the drain. However, there is no evidence for a bowel or urinary fistula. IMPRESSION: 1. No evidence for a bowel or urinary fistula. 2. Residual cavity along the cephalad aspect of the drain. Drain was kept in place. Electronically Signed   By: Juliene Balder M.D.   On: 12/23/2023 17:42   DG CHEST PORT 1 VIEW Result Date: 12/23/2023 CLINICAL  DATA:  Dyspnea. EXAM: PORTABLE CHEST  1 VIEW COMPARISON:  December 21, 2023. FINDINGS: Stable cardiomediastinal silhouette. Endotracheal and nasogastric tubes are unchanged. Bilateral internal jugular catheters are unchanged. Hypoinflation of the lungs is noted. No significant consolidative process is noted. Bony thorax is unremarkable. Status post coronary bypass graft. IMPRESSION: Stable support apparatus. Hypoinflation of the lungs. Electronically Signed   By: Lynwood Landy Raddle M.D.   On: 12/23/2023 10:23    Cardiac Studies  Echo 12/17/23:  1. Left ventricular ejection fraction, by estimation, is 50 to 55%. The  left ventricle has low normal function. The left ventricle has no regional  wall motion abnormalities. Left ventricular diastolic parameters were  normal.   2. Right ventricular systolic function is normal. The right ventricular  size is mildly enlarged. Tricuspid regurgitation signal is inadequate for  assessing PA pressure.   3. The mitral valve is normal in structure. No evidence of mitral valve  regurgitation. No evidence of mitral stenosis.   4. The aortic valve is normal in structure. Aortic valve regurgitation is  not visualized. No aortic stenosis is present.   5. The inferior vena cava is normal in size with greater than 50%  respiratory variability, suggesting right atrial pressure of 3 mmHg.   Patient Profile    58 y.o. male   Assessment & Plan   Wide complex tachycardia - favor VT, currently sinus tachycardia with episodes of nonsustained VT - had sustained VT in the ER 12/16/23 requiring emergent cardioversion with IV Mg and IV amiodarone  --> resulting in Afib vs flutter, currently sinus tach - ultimately intubated and cardiology asked to evaluate for WCT - has had a total of 6.75 g amiodarone  IV. - Change amiodarone  to 400 mg twice daily for a total of 10 g (through Wednesday) then change to 400 mg daily (starting Thursday) - Continue Lopressor   - continue to treat underlying metabolic derangements -  will consider EP consult prior to DC  CAD s/p CABG x 5 in 04/2022 Elevated troponin  - trop 82 --> 95 - will likely need ischemic eval once recovered clinically  Hypertension - continue HD   Post op atrial fibrillation - continuing amiodarone  as above - given brief nature of WCT, suspect VT rather than Afib with RVR - Eliquis  stopped in 2024,   Acute hypoxic/hypercarbic respiratory failure - extubated 10/3  AKI - nephrology following, failed Lasix  stress test. Undergoing intermittent HD Urinoma, abdominal abscess - urology following after recent radical prostatectomy Septic shock - per PCCM - off pressors Ileus - improving, surgery advancing diet       For questions or updates, please contact [facility] Please consult [email or web address] for contact info under       Signed, Emeline FORBES Calender, MD  12/25/2023, 9:08 AM

## 2023-12-26 DIAGNOSIS — K651 Peritoneal abscess: Secondary | ICD-10-CM | POA: Diagnosis not present

## 2023-12-26 DIAGNOSIS — Z951 Presence of aortocoronary bypass graft: Secondary | ICD-10-CM | POA: Insufficient documentation

## 2023-12-26 DIAGNOSIS — I4891 Unspecified atrial fibrillation: Secondary | ICD-10-CM | POA: Diagnosis not present

## 2023-12-26 DIAGNOSIS — N179 Acute kidney failure, unspecified: Secondary | ICD-10-CM | POA: Diagnosis not present

## 2023-12-26 DIAGNOSIS — I480 Paroxysmal atrial fibrillation: Secondary | ICD-10-CM | POA: Diagnosis not present

## 2023-12-26 DIAGNOSIS — I472 Ventricular tachycardia, unspecified: Secondary | ICD-10-CM | POA: Diagnosis not present

## 2023-12-26 DIAGNOSIS — I10 Essential (primary) hypertension: Secondary | ICD-10-CM | POA: Diagnosis not present

## 2023-12-26 DIAGNOSIS — I2510 Atherosclerotic heart disease of native coronary artery without angina pectoris: Secondary | ICD-10-CM | POA: Diagnosis not present

## 2023-12-26 LAB — GLUCOSE, CAPILLARY
Glucose-Capillary: 109 mg/dL — ABNORMAL HIGH (ref 70–99)
Glucose-Capillary: 131 mg/dL — ABNORMAL HIGH (ref 70–99)
Glucose-Capillary: 118 mg/dL — ABNORMAL HIGH (ref 70–99)
Glucose-Capillary: 115 mg/dL — ABNORMAL HIGH (ref 70–99)

## 2023-12-26 LAB — HEPATIC FUNCTION PANEL
ALT: 27 U/L (ref 0–44)
AST: 39 U/L (ref 15–41)
Albumin: 1.9 g/dL — ABNORMAL LOW (ref 3.5–5.0)
Alkaline Phosphatase: 92 U/L (ref 38–126)
Bilirubin, Direct: 0.3 mg/dL — ABNORMAL HIGH (ref 0.0–0.2)
Indirect Bilirubin: 0.8 mg/dL (ref 0.3–0.9)
Total Bilirubin: 1.1 mg/dL (ref 0.0–1.2)
Total Protein: 6.6 g/dL (ref 6.5–8.1)

## 2023-12-26 LAB — BASIC METABOLIC PANEL WITH GFR
Anion gap: 18 — ABNORMAL HIGH (ref 5–15)
BUN: 114 mg/dL — ABNORMAL HIGH (ref 6–20)
CO2: 20 mmol/L — ABNORMAL LOW (ref 22–32)
Calcium: 8 mg/dL — ABNORMAL LOW (ref 8.9–10.3)
Chloride: 95 mmol/L — ABNORMAL LOW (ref 98–111)
Creatinine, Ser: 6.71 mg/dL — ABNORMAL HIGH (ref 0.61–1.24)
GFR, Estimated: 9 mL/min — ABNORMAL LOW (ref >60)
Glucose, Bld: 99 mg/dL (ref 70–99)
Potassium: 4.5 mmol/L (ref 3.5–5.1)
Sodium: 133 mmol/L — ABNORMAL LOW (ref 135–145)

## 2023-12-26 LAB — CULTURE, BLOOD (ROUTINE X 2)
Culture: NO GROWTH
Culture: NO GROWTH
Special Requests: ADEQUATE
Special Requests: ADEQUATE

## 2023-12-26 LAB — PHOSPHORUS: Phosphorus: 8.9 mg/dL — ABNORMAL HIGH (ref 2.5–4.6)

## 2023-12-26 LAB — MAGNESIUM: Magnesium: 2.4 mg/dL (ref 1.7–2.4)

## 2023-12-26 MED ORDER — MEDIHONEY WOUND/BURN DRESSING EX PSTE
1.0000 | PASTE | Freq: Every day | CUTANEOUS | Status: DC
  Administered 2023-12-26 – 2024-01-01 (×6): 1 via TOPICAL
  Filled 2023-12-26 (×3): qty 44

## 2023-12-26 NOTE — Plan of Care (Signed)
  Problem: Education: Goal: Knowledge of the procedure and recovery process will improve Outcome: Progressing   Problem: Bowel/Gastric: Goal: Gastrointestinal status for postoperative course will improve Outcome: Progressing   Problem: Pain Management: Goal: General experience of comfort will improve Outcome: Progressing   Problem: Skin Integrity: Goal: Demonstration of wound healing without infection will improve Outcome: Progressing   Problem: Urinary Elimination: Goal: Ability to avoid or minimize complications of infection will improve Outcome: Progressing Goal: Ability to achieve and maintain urine output will improve Outcome: Progressing Goal: Home care management will improve Outcome: Progressing   Problem: Education: Goal: Knowledge of General Education information will improve Description: Including pain rating scale, medication(s)/side effects and non-pharmacologic comfort measures Outcome: Progressing   Problem: Health Behavior/Discharge Planning: Goal: Ability to manage health-related needs will improve Outcome: Progressing   Problem: Clinical Measurements: Goal: Ability to maintain clinical measurements within normal limits will improve Outcome: Progressing Goal: Will remain free from infection Outcome: Progressing Goal: Diagnostic test results will improve Outcome: Progressing Goal: Respiratory complications will improve Outcome: Progressing Goal: Cardiovascular complication will be avoided Outcome: Progressing   Problem: Activity: Goal: Risk for activity intolerance will decrease Outcome: Progressing   Problem: Nutrition: Goal: Adequate nutrition will be maintained Outcome: Progressing   Problem: Coping: Goal: Level of anxiety will decrease Outcome: Progressing   Problem: Elimination: Goal: Will not experience complications related to bowel motility Outcome: Progressing Goal: Will not experience complications related to urinary  retention Outcome: Progressing   Problem: Pain Managment: Goal: General experience of comfort will improve and/or be controlled Outcome: Progressing   Problem: Safety: Goal: Ability to remain free from injury will improve Outcome: Progressing   Problem: Skin Integrity: Goal: Risk for impaired skin integrity will decrease Outcome: Progressing   Problem: Activity: Goal: Ability to tolerate increased activity will improve Outcome: Progressing   Problem: Respiratory: Goal: Ability to maintain a clear airway and adequate ventilation will improve Outcome: Progressing   Problem: Role Relationship: Goal: Method of communication will improve Outcome: Progressing   Problem: Education: Goal: Ability to describe self-care measures that may prevent or decrease complications (Diabetes Survival Skills Education) will improve Outcome: Progressing Goal: Individualized Educational Video(s) Outcome: Progressing   Problem: Coping: Goal: Ability to adjust to condition or change in health will improve Outcome: Progressing   Problem: Fluid Volume: Goal: Ability to maintain a balanced intake and output will improve Outcome: Progressing   Problem: Health Behavior/Discharge Planning: Goal: Ability to identify and utilize available resources and services will improve Outcome: Progressing Goal: Ability to manage health-related needs will improve Outcome: Progressing   Problem: Metabolic: Goal: Ability to maintain appropriate glucose levels will improve Outcome: Progressing   Problem: Nutritional: Goal: Maintenance of adequate nutrition will improve Outcome: Progressing Goal: Progress toward achieving an optimal weight will improve Outcome: Progressing   Problem: Skin Integrity: Goal: Risk for impaired skin integrity will decrease Outcome: Progressing   Problem: Tissue Perfusion: Goal: Adequacy of tissue perfusion will improve Outcome: Progressing   Problem: Safety: Goal:  Non-violent Restraint(s) Outcome: Progressing

## 2023-12-26 NOTE — Progress Notes (Signed)
 Requested to see pt for out-pt HD needs d/c. Pt's chart reviewed. Contacted case management staff to inquire about possible d/c plan for pt. Case management staff unsure of d/c plan at this time. If pt to return home, can initiate referral for out-pt HD. If pt to need placement for rehab, will need to coordinate out-pt HD needs once placement confirmed. Will follow and assist with out-pt HD needs according to pt's d/c plan.  Randine Mungo Dialysis Navigator [phone]

## 2023-12-26 NOTE — Consult Note (Signed)
 ELECTROPHYSIOLOGY CONSULT NOTE    Patient ID: Glenn Stewart MRN: [id number], DOB/AGE: 59-Sep-1967 59 y.o.  Admit date: 12/16/2023 Date of Consult: 12/26/2023  Primary Physician: Pcp, No Primary Cardiologist: Redell Shallow, MD  Electrophysiologist: New - Dr. Nancey saw on Gen Cards Rounding weekend   Referring Provider: Dr. Delford  Patient Profile: Glenn Stewart is a 58 y.o. male with a history of CAD s/p CABG 04/2022, HTN, HLD, GERD, NASH, Bipolar/ADHD and s/p recent robotic assisted laparoscopic radial prostatectomy on 11/17/23 who is being seen today for the evaluation of WCT at the request of Dr. Delford.  HPI:  Glenn Stewart is a 58 y.o. male with medical history as above and a very complicated admission thus far.   Pt presented to [facility] 9/26 via EMS with weakness, abdominal distention/tenderness, and altered mental status. Found to be in AF RVR / VT. Emergently DCC x 2 and given IV amiodarone  -> felt to remain in AF RVR vs possible flutter, but no further VT.   Post cardioversion became hypotensive with respiratory distress therefore intubated and placed on norepinephrine .   Difficulty oxygenating post intubation with normal PIP requiring 1.0 and 12 PEEP.     Labs noted for K2.8, Na 129, bicarb 16, BUN/ sCr 55/ 3.92 (recently  ), t. Bili 3.4, AST 68, albumin  1.6, protein 3.4, INR 1.2.  CTH neg,    CT abdomen and pelvis with fluid filled distended small bowel with decompressed terminal ileum, consistent with small bowel obstruction.  Irregular wall thickening diffusely throughout the distal small bowel and right colon suggesting enterocolitis or inflammatory bowel disease.  Multiple loculated fluid collections, some with gas, demonstrated in the right abdomen and pelvis indicating abscess. This is likely due to perforation, possible related to inflammatory bowel disease or to an occult perforated appendicitis.  Consolidation versus compressive atelectasis in both lung bases.  Small pleural effusions.   His infection has gradually improved and ileus has resolved. Transitioned from CRRT to IHD.   EP asked to formally see to comment on need for ICD and/or recommendations for AAD.   Pt is sitting in bed today without issue. Tearful when the recent events are reviewed. Hopeful for complete recovery.   Labs Potassium4.5 (10/06 9683) Magnesium   2.4 (10/06 0316) Creatinine, ser  6.71* (10/06 0316)        .    Past Medical History:  Diagnosis Date   ADHD (attention deficit hyperactivity disorder)    Anxiety state, unspecified    Bipolar 1 disorder (HCC)    Cancer (HCC)    CHEST PAIN 12/04/2008   Qualifier: Diagnosis of  By: [name] MD, [name]    Coronary artery disease    Depressive disorder, not elsewhere classified    Dermatophytosis of nail    Esophageal reflux    History of kidney stones    HYPERGLYCEMIA 07/10/2007   Qualifier: Diagnosis of  By: [name] MD, [name]    Low back pain 09/18/2013   Other chronic nonalcoholic liver disease    Pre-diabetes    Problems with hearing    Prostatitis, unspecified    Pure hypercholesterolemia    Screen for STD (sexually transmitted disease) 04/20/2011   Sprain of cruciate ligament of knee    Tear of medial cartilage or meniscus of knee, current    TMJ (temporomandibular joint disorder) 12/25/2010   Unspecified essential hypertension    Unspecified gastritis and gastroduodenitis without mention of hemorrhage    Urethritis, unspecified  Surgical History:  Past Surgical History:  Procedure Laterality Date   CORONARY ARTERY BYPASS GRAFT N/A 05/12/2022   Procedure: CORONARY ARTERY BYPASS GRAFTING (CABG) X FIVE BYPASSES USING OPEN LEFT INTERNAL MAMMARY ARTERY, OPEN LEFT RADIAL ARTERY, AND ENDOSCOPIC RIGHT GREATER SAPHENOUS VEIN HARVEST.;  Surgeon: [name], MD;  Location: [facility] OR;  Service: Open Heart Surgery;  Laterality: N/A;   CYSTOSCOPY W/ LITHOLAPAXY / EHL     IR CV LINE INJECTION   12/23/2023   LEFT HEART CATH AND CORONARY ANGIOGRAPHY N/A 05/10/2022   Procedure: LEFT HEART CATH AND CORONARY ANGIOGRAPHY;  Surgeon: [name], MD;  Location: [facility] INVASIVE CV LAB;  Service: Cardiovascular;  Laterality: N/A;   No prior surgery     RADIAL ARTERY HARVEST Left 05/12/2022   Procedure: RADIAL ARTERY HARVEST;  Surgeon: [name], MD;  Location: [facility] OR;  Service: Open Heart Surgery;  Laterality: Left;   ROBOT ASSISTED LAPAROSCOPIC RADICAL PROSTATECTOMY N/A 11/17/2023   Procedure: PROSTATECTOMY, RADICAL, ROBOT-ASSISTED, LAPAROSCOPIC;  Surgeon: [name], MD;  Location: [facility] ORS;  Service: Urology;  Laterality: N/A;  ROBOTIC RADICAL PROSTATECTOMY AND BILATERAL PELVIC LYMPH NODE DISSECTION   TEE WITHOUT CARDIOVERSION N/A 05/12/2022   Procedure: TRANSESOPHAGEAL ECHOCARDIOGRAM;  Surgeon: [name], MD;  Location: [facility] OR;  Service: Open Heart Surgery;  Laterality: N/A;     Medications Prior to Admission  Medication Sig Dispense Refill Last Dose/Taking   acetaminophen  (TYLENOL ) 325 MG tablet Take 2 tablets (650 mg total) by mouth every 4 (four) hours as needed for moderate pain or headache.      amLODipine  (NORVASC ) 5 MG tablet Take 1 tablet (5 mg total) by mouth daily. 90 tablet 3    atorvastatin  (LIPITOR) 80 MG tablet Take 1 tablet (80 mg total) by mouth daily. 90 tablet 3    ciprofloxacin  (CIPRO ) 500 MG tablet Take 1 tablet (500 mg total) by mouth 2 (two) times daily. Start day prior to follow up appt for catheter removal 6 tablet 0    docusate sodium  (COLACE) 100 MG capsule Take 1 capsule (100 mg total) by mouth 2 (two) times daily.      isosorbide  mononitrate (IMDUR ) 30 MG 24 hr tablet Take 1 tablet (30 mg total) by mouth daily. 90 tablet 3    metoprolol  tartrate (LOPRESSOR ) 25 MG tablet TAKE ONE TABLET BY MOUTH TWICE DAILY 180 tablet 0    pantoprazole  (PROTONIX ) 20 MG tablet Take 1 tablet (20 mg total) by mouth daily. 30 tablet 0    sertraline   (ZOLOFT ) 100 MG tablet Take 100 mg by mouth daily.      tamsulosin  (FLOMAX ) 0.4 MG CAPS capsule Take 0.4 mg by mouth daily after supper.      traMADol  (ULTRAM ) 50 MG tablet Take 1-2 tablets (50-100 mg total) by mouth every 6 (six) hours as needed for moderate pain (pain score 4-6) or severe pain (pain score 7-10). 20 tablet 0     Inpatient Medications:   (feeding supplement) PROSource Plus  30 mL Oral TID AC   amiodarone   400 mg Oral BID   [START ON 12/29/2023] amiodarone   400 mg Oral Daily   amLODipine   5 mg Oral Daily   bisacodyl   10 mg Rectal Daily   Chlorhexidine  Gluconate Cloth  6 each Topical Daily   feeding supplement  1 Container Oral TID BM   Gerhardt's butt cream   Topical BID   heparin  injection (subcutaneous)  5,000 Units Subcutaneous Q8H   insulin  aspart  0-15 Units Subcutaneous TID WC   leptospermum manuka honey  1 Application Topical Daily   metoprolol  tartrate  25 mg Oral BID   multivitamin  1 tablet Oral QHS   sodium chloride  flush  10-40 mL Intracatheter Q12H   sodium chloride  flush  5 mL Intracatheter Q8H   thiamine  100 mg Oral Daily    Allergies:  Allergies  Allergen Reactions   Sulfonamide Derivatives Hives    Family History  Problem Relation Age of Onset   Diabetes Father    Hypertension Father    Heart failure Father      Physical Exam: Vitals:   12/26/23 0511 12/26/23 0710 12/26/23 1101 12/26/23 1615  BP: (!) 144/78 (!) 158/87 (!) 135/92 (!) 152/82  Pulse: 75 82 62 87  Resp: 20 20 20 20   Temp: 98.8 F (37.1 C) 97.9 F (36.6 C) 98.3 F (36.8 C) 98 F (36.7 C)  TempSrc: Oral Oral Oral Oral  SpO2: 92% 93% 100% 100%  Weight:      Height:        GEN- NAD, A&O x 3, normal affect HEENT: Normocephalic, atraumatic Lungs- CTAB, Normal effort.  Heart- Regular rate and rhythm, No M/G/R.  GI- Soft, NT, ND.  Extremities- No clubbing, cyanosis, or edema   Radiology/Studies: IR INJECT INDWELLING DRAINAGE CATHETER Result Date:  12/23/2023 INDICATION: Follow-up intra-abdominal abscess. Right abdominal abscess drain was placed on 12/17/2023. Recent CT demonstrates that the right abdominal abscess has decreased in size following drain placement. Evaluate for a bowel or urinary fistula. EXAM: DRAIN INJECTION WITH FLUOROSCOPY MEDICATIONS: None ANESTHESIA/SEDATION: None FLUOROSCOPY TIME:  Radiation Exposure Index (as provided by the fluoroscopic device): 42 mGy Kerma CONTRAST:  15 mL Omnipaque  300 COMPLICATIONS: None immediate. PROCEDURE: Patient was placed supine on the interventional table. Scout image was obtained. Drain was injected with contrast. Thick yellow fluid was aspirated and the drain was flushed with saline. Fluoroscopic images were taken and saved for this procedure. FINDINGS: Pigtail drain in the right lower quadrant of the abdomen. Contrast fills an irregular collection along the lateral aspect of the right lower abdomen. Contrast is preferentially filling a cavity cephalad to the drain. However, there is no evidence for a bowel or urinary fistula. IMPRESSION: 1. No evidence for a bowel or urinary fistula. 2. Residual cavity along the cephalad aspect of the drain. Drain was kept in place. Electronically Signed   By: Juliene Balder M.D.   On: 12/23/2023 17:42   DG CHEST PORT 1 VIEW Result Date: 12/23/2023 CLINICAL DATA:  Dyspnea. EXAM: PORTABLE CHEST 1 VIEW COMPARISON:  December 21, 2023. FINDINGS: Stable cardiomediastinal silhouette. Endotracheal and nasogastric tubes are unchanged. Bilateral internal jugular catheters are unchanged. Hypoinflation of the lungs is noted. No significant consolidative process is noted. Bony thorax is unremarkable. Status post coronary bypass graft. IMPRESSION: Stable support apparatus. Hypoinflation of the lungs. Electronically Signed   By: Lynwood Landy Raddle M.D.   On: 12/23/2023 10:23   DG Abd Portable 1V Result Date: 12/22/2023 CLINICAL DATA:  Ileus. EXAM: PORTABLE ABDOMEN - 1 VIEW COMPARISON:   Most recent radiograph 12/20/2023, CT 12/21/2023 FINDINGS: Unchanged gaseous small bowel distention in the central abdomen. There is also air-filled transverse colon. Tip and side port of the enteric tube below the diaphragm in the stomach. Right lower quadrant drain remains in place. No evidence of free air. IMPRESSION: Unchanged gaseous small bowel in transverse colonic distention in the central abdomen, favoring ileus. Electronically Signed   By: Andrea Marlee HERO.D.  On: 12/22/2023 11:32   DG CHEST PORT 1 VIEW Result Date: 12/21/2023 EXAM: 1 VIEW(S) XRAY OF THE CHEST 12/21/2023 05:57:00 PM COMPARISON: 12/19/2023 CLINICAL HISTORY: Dyspnea FINDINGS: LINES, TUBES AND DEVICES: Endotracheal tube in place with tip 2.1 cm above carina. Enteric tube with tip below the diaphragm and sidehole overlying the stomach. Right internal jugular central venous catheter in place with tip over right atrium. Left internal jugular central venous catheter in place with tip over brachiocephalic vein. LUNGS AND PLEURA: Low lung volumes. Bibasilar patchy opacities. Small left pleural effusion. No pneumothorax. No pulmonary edema. HEART AND MEDIASTINUM: Median sternotomy wires and mediastinal clips noted. No acute abnormality of the cardiac and mediastinal silhouettes. BONES AND SOFT TISSUES: No acute osseous abnormality. IMPRESSION: 1. Low lung volumes and bibasilar patchy opacities. 2. Small left pleural effusion. 3. Support devices as above. Electronically signed by: Donnice Mania MD 12/21/2023 07:14 PM EDT RP Workstation: [id number]   CT ABDOMEN PELVIS W CONTRAST Result Date: 12/21/2023 CLINICAL DATA:  Intraabdominal abscess EXAM: CT ABDOMEN AND PELVIS WITH CONTRAST TECHNIQUE: Multidetector CT imaging of the abdomen and pelvis was performed using the standard protocol following bolus administration of intravenous contrast. RADIATION DOSE REDUCTION: This exam was performed according to the departmental dose-optimization program  which includes automated exposure control, adjustment of the mA and/or kV according to patient size and/or use of iterative reconstruction technique. CONTRAST:  75mL OMNIPAQUE  IOHEXOL  350 MG/ML SOLN COMPARISON:  12/20/2023, 12/17/2023, 12/16/2023 FINDINGS: Lower chest: No focal airspace consolidation or pleural effusion.Posterior bibasilar dependent atelectasis. Dense multi-vessel coronary atherosclerosis. Sternotomy wires and postsurgical changes of a prior CABG. Hepatobiliary: No mass.Decompressed gallbladder containing a few small radiopaque stones. Apparent enhancing soft tissue in the gallbladder fundus, may represent normal decompressed gallbladder wall in close approximation, or focal adenomyomatosis. No intrahepatic or extrahepatic biliary ductal dilation. The portal veins are patent. Pancreas: No mass or main ductal dilation. No peripancreatic inflammation or fluid collection. Spleen: Normal size. No mass. Adrenals/Urinary Tract: Similar multifocal nodularity in the left adrenal gland, with the largest nodule measuring 1.9 cm. There is also likely a small adrenal nodule in the medial right adrenal gland (axial 37). No renal mass. No nephrolithiasis or hydronephrosis. The urinary bladder is completely decompressed with a low lying urinary catheter in place. Stomach/Bowel: Esophagogastric tube terminates in the stomach, well-positioned. Enteric contrast present within the stomach and proximal small bowel. While a couple of segments of small bowel in the left upper quadrant are dilated, no abrupt transition point present to suggest small bowel obstruction. Fluid present within the proximal colon.Normal appendix. Sigmoid colonic diverticulosis. No changes of acute diverticulitis. Vascular/Lymphatic: No aortic aneurysm. No intraabdominal or pelvic lymphadenopathy. Reproductive: Prostatectomy.No free pelvic fluid. Other: No pneumoperitoneum. Similar diffuse mesenteric stranding with perinephric and  retroperitoneal stranding, more pronounced on the right. Interval decompression of the right retroperitoneal collection, measuring 2.8 x 7.7 cm (previously 5.6 x 10.6 cm, by my measurement), with a similarly well positioned percutaneous pigtail drainage catheter in the lower portion of the collection. Multiple small low-density fluid collections along the right lateral omentum and right paracolic gutter, measuring up to 2.4 x 4.1 cm (axial 56). A small fluid collection along the ventral right abdominal wall (axial 73), is unchanged measuring 1.9 x 7.1 cm. The irregular fluid collection in the right lower quadrant extending to the midline ventral abdominal wall, measures 6.5 x 14.4 x 13.3 cm (APxTRxCC), axial 80. Right lower quadrant, ileocolic mesentery collection appears similar measuring 4.2 x 4.4 cm (axial 56). The fluid  collections along the pelvic sidewall are improving in the interim. For example, the right sided collection measures 5.6 x 2.5 cm (previously 6.5 x 4.7 cm, axial 93). Musculoskeletal: No acute fracture or destructive lesion. Multilevel degenerative disc disease of the spine. IMPRESSION: 1. Interval decompression of the right retroperitoneal fluid collection, measuring 2.8 x 7.7 cm (previously, 5.6 x 10.6 cm, by my measurement). The pigtail percutaneous drainage catheter is otherwise similarly well-positioned in the fluid collection. 2. irregular fluid collection in the right lower quadrant extending to the midline ventral abdominal wall, measures 6.5 x 14.4 x 13.3 cm (axial 80), unchanged. 3. The bilateral pelvic sidewall fluid collections have decreased in size in the interim, measuring 5.6 x 2.5 cm on the right (previously, 6.5 x 4.7 cm). 4. Multiple small additional fluid collections in the right ileocolic mesentery, along the right paracolic gutter, and the right lower quadrant ventral abdominal wall are unchanged, as described above. 5. Similarly positioned low-lying catheter in the urinary  bladder, which may be expected positioning status post prostatectomy. 6. Well-positioned esophagogastric tube terminating in the stomach. While a couple of segments of small bowel in the upper abdomen are distended, no abrupt transition point present to suggest small bowel obstruction. Aortic Atherosclerosis (ICD10-I70.0). Electronically Signed   By: Rogelia Myers M.D.   On: 12/21/2023 12:55   DG Abd Portable 1V Result Date: 12/20/2023 CLINICAL DATA:  Small-bowel obstruction. EXAM: PORTABLE ABDOMEN - 1 VIEW COMPARISON:  12/19/2023 FINDINGS: Pigtail catheter again seen in the right lower quadrant. Nasogastric tube is seen with tip in the gastric body. Moderate to markedly dilated small bowel loops are again seen in the central and left abdomen, without significant change since previous study. No significant colonic gas or stool seen. These findings remain consistent with a small-bowel obstruction. IMPRESSION: No apparent change in small-bowel obstruction since prior study. Nasogastric tube tip in gastric body. Electronically Signed   By: Norleen DELENA Kil M.D.   On: 12/20/2023 09:15   DG Abd Portable 1V-Small Bowel Obstruction Protocol-initial, 8 hr delay Result Date: 12/19/2023 CLINICAL DATA:  8 hour delay radiograph. EXAM: PORTABLE ABDOMEN - 1 VIEW COMPARISON:  CT abdomen pelvis dated 12/16/2023. FINDINGS: Evaluation is limited due to body habitus and patient's positioning. Oral contrast noted pooling in the gastric fundus. No definite contrast noted in the small bowel or colon. Dilated air-filled loops of small bowel measure up to 5.5 cm in caliber. Continued follow-up recommended. A pigtail drainage catheter noted in the right lower quadrant. IMPRESSION: 1. Oral contrast pooling in the gastric fundus. No definite contrast noted in the small bowel or colon. 2. Dilated air-filled loops of small bowel. Electronically Signed   By: Vanetta Chou M.D.   On: 12/19/2023 20:09   DG CHEST PORT 1 VIEW Result Date:  12/19/2023 CLINICAL DATA:  Respiratory failure. EXAM: PORTABLE CHEST 1 VIEW COMPARISON:  12/17/2023. FINDINGS: Endotracheal tube tip terminates approximately 4 cm above the carina. Similarly position right IJ CVC catheter tip in the right atrium and the left CVC catheter tip overlying the brachiocephalic vein. No pneumothorax. Stable cardiomediastinal contours status post median sternotomy and CABG. Similar small left pleural effusion. IMPRESSION: 1. Similarly position life-support apparatus, as above. 2. Small left pleural effusion, not significantly changed. Electronically Signed   By: Harrietta Sherry M.D.   On: 12/19/2023 08:21   ECHOCARDIOGRAM COMPLETE Result Date: 12/18/2023    ECHOCARDIOGRAM REPORT   Patient Name:   ARIES TOWNLEY Date of Exam: 12/17/2023 Medical Rec #:  [id number]  Height:       65.0 in Accession #:    [id number]       Weight:       228.8 lb Date of Birth:  23-Nov-1965         BSA:          2.095 m Patient Age:    58 years         BP:           99/69 mmHg Patient Gender: M                HR:           95 bpm. Exam Location:  Inpatient Procedure: 2D Echo, Cardiac Doppler and Color Doppler (Both Spectral and Color            Flow Doppler were utilized during procedure). Indications:    Shock R57.9  History:        Patient has prior history of Echocardiogram examinations, most                 recent 05/10/2022. CAD and Angina, Prior CABG,                 Signs/Symptoms:Chest Pain; Risk Factors:Hypertension and                 Dyslipidemia.  Sonographer:    Thea Norlander RCS Referring Phys: VINA NOVAK SIMPSON IMPRESSIONS  1. Left ventricular ejection fraction, by estimation, is 50 to 55%. The left ventricle has low normal function. The left ventricle has no regional wall motion abnormalities. Left ventricular diastolic parameters were normal.  2. Right ventricular systolic function is normal. The right ventricular size is mildly enlarged. Tricuspid regurgitation signal is inadequate for  assessing PA pressure.  3. The mitral valve is normal in structure. No evidence of mitral valve regurgitation. No evidence of mitral stenosis.  4. The aortic valve is normal in structure. Aortic valve regurgitation is not visualized. No aortic stenosis is present.  5. The inferior vena cava is normal in size with greater than 50% respiratory variability, suggesting right atrial pressure of 3 mmHg. FINDINGS  Left Ventricle: Left ventricular ejection fraction, by estimation, is 50 to 55%. The left ventricle has low normal function. The left ventricle has no regional wall motion abnormalities. The left ventricular internal cavity size was normal in size. There is no left ventricular hypertrophy. Left ventricular diastolic parameters were normal. Right Ventricle: The right ventricular size is mildly enlarged. No increase in right ventricular wall thickness. Right ventricular systolic function is normal. Tricuspid regurgitation signal is inadequate for assessing PA pressure. Left Atrium: Left atrial size was normal in size. Right Atrium: Right atrial size was normal in size. Pericardium: There is no evidence of pericardial effusion. Mitral Valve: The mitral valve is normal in structure. No evidence of mitral valve regurgitation. No evidence of mitral valve stenosis. Tricuspid Valve: The tricuspid valve is normal in structure. Tricuspid valve regurgitation is trivial. No evidence of tricuspid stenosis. Aortic Valve: The aortic valve is normal in structure. Aortic valve regurgitation is not visualized. No aortic stenosis is present. Aortic valve peak gradient measures 7.8 mmHg. Pulmonic Valve: The pulmonic valve was normal in structure. Pulmonic valve regurgitation is not visualized. No evidence of pulmonic stenosis. Aorta: The aortic root is normal in size and structure. Venous: The inferior vena cava is normal in size with greater than 50% respiratory variability, suggesting right atrial pressure of 3 mmHg. IAS/Shunts:  No atrial level  shunt detected by color flow Doppler.  LEFT VENTRICLE PLAX 2D LVIDd:         4.40 cm   Diastology LVIDs:         3.20 cm   LV e' medial:    8.92 cm/s LV PW:         1.10 cm   LV E/e' medial:  10.5 LV IVS:        1.10 cm   LV e' lateral:   20.40 cm/s LVOT diam:     2.40 cm   LV E/e' lateral: 4.6 LV SV:         73 LV SV Index:   35 LVOT Area:     4.52 cm  RIGHT VENTRICLE             IVC RV S prime:     10.70 cm/s  IVC diam: 2.00 cm TAPSE (M-mode): 1.7 cm LEFT ATRIUM             Index        RIGHT ATRIUM           Index LA diam:        4.00 cm 1.91 cm/m   RA Area:     14.80 cm LA Vol (A2C):   33.1 ml 15.80 ml/m  RA Volume:   29.80 ml  14.23 ml/m LA Vol (A4C):   28.4 ml 13.56 ml/m LA Biplane Vol: 31.2 ml 14.89 ml/m  AORTIC VALVE AV Area (Vmax): 3.36 cm AV Vmax:        140.00 cm/s AV Peak Grad:   7.8 mmHg LVOT Vmax:      104.00 cm/s LVOT Vmean:     70.200 cm/s LVOT VTI:       0.161 m  AORTA Ao Root diam: 3.50 cm Ao Asc diam:  3.40 cm MITRAL VALVE MV Area (PHT): 4.21 cm    SHUNTS MV Decel Time: 180 msec    Systemic VTI:  0.16 m MV E velocity: 93.30 cm/s  Systemic Diam: 2.40 cm MV A velocity: 72.80 cm/s MV E/A ratio:  1.28 Oneil Parchment MD Electronically signed by Oneil Parchment MD Signature Date/Time: 12/18/2023/7:04:53 AM    Final    DG CHEST PORT 1 VIEW Result Date: 12/17/2023 CLINICAL DATA:  Central line placement EXAM: PORTABLE CHEST 1 VIEW COMPARISON:  12/17/2023 FINDINGS: Introduction of a LEFT central venous line with tip in the brachycephalic vein. No pneumothorax Endotracheal tube, NG tube, and RIGHT central venous line are unchanged. Midline sternotomy.  Low lung volumes.  LEFT effusion. IMPRESSION: 1. LEFT central venous line with tip in the brachiocephalic vein. No pneumothorax. 2. Additional support apparatus unchanged. 3. Low lung volumes and LEFT effusion. Electronically Signed   By: Jackquline Boxer M.D.   On: 12/17/2023 16:55   CT GUIDED PERITONEAL/RETROPERITONEAL FLUID DRAIN BY  [facility] CATH Result Date: 12/17/2023 INDICATION: [id number] Abdominal fluid collection [phone]. Concern for bladder perforation EXAM: CT-GUIDED RIGHT LOWER QUADRANT ABSCESS DRAINAGE CATHETER PLACEMENT COMPARISON:  CT cystogram and CT AP, 12/16/2023. MEDICATIONS: The patient is currently admitted to the hospital and receiving intravenous antibiotics. The antibiotics were administered within an appropriate time frame prior to the initiation of the procedure. ANESTHESIA/SEDATION: Sedation by the ICU RN team was performed. Please see RN log for details. CONTRAST:  None FLUOROSCOPY TIME:  CT dose; 1011 mGycm COMPLICATIONS: None immediate. PROCEDURE: RADIATION DOSE REDUCTION: This exam was performed according to the departmental dose-optimization program which includes automated exposure control, adjustment of the mA  and/or kV according to patient size and/or use of iterative reconstruction technique. Informed written consent was obtained from the patient and/or patient's representative after a discussion of the risks, benefits and alternatives to treatment. The patient was placed supine on the CT gantry and a pre procedural CT was performed re-demonstrating the known abscess/fluid collection within the RIGHT lower quad. The procedure was planned. A timeout was performed prior to the initiation of the procedure. The RIGHT lower quad was prepped and draped in the usual sterile fashion. The overlying soft tissues were anesthetized with 1% lidocaine  with epinephrine . Appropriate trajectory was planned with the use of a 22 gauge spinal needle. An 18 gauge trocar needle was advanced into the abscess/fluid collection and a short Amplatz super stiff wire was coiled within the collection. Appropriate positioning was confirmed with a limited CT scan. The tract was serially dilated allowing placement of a 14 Fr Fr drainage catheter. Appropriate positioning was confirmed with a limited postprocedural CT scan. 30 mL of turbid fluid was  aspirated. The tube was connected to a drainage bag and sutured in place. A dressing was placed. The patient tolerated the procedure well without immediate post procedural complication. IMPRESSION: Successful CT-guided placement of a 14 Fr drainage catheter into the RIGHT lower quadrant with aspiration of 30 mL mL of turbid fluid. Samples were sent to the laboratory as requested by the ordering clinical team. RECOMMENDATIONS: The patient will return to Vascular Interventional Radiology (VIR) for routine drainage catheter evaluation and exchange in 7-10 days. Thom Hall, MD [facility] Radiology Electronically Signed   By: Thom Hall M.D.   On: 12/17/2023 14:04   DG Chest Port 1 View Result Date: 12/17/2023 EXAM: 1 VIEW(S) XRAY OF THE CHEST 12/17/2023 05:48:00 AM COMPARISON: 12/16/2023 CLINICAL HISTORY: Respiratory failure; Hx of: chest pain, coronary artery disease; Chief complaint: Loss of consciousness FINDINGS: LINES, TUBES AND DEVICES: Right IJ central venous catheter in place with tip over the SVC. ETT in place with tip 3.7 cm above the carina. Enteric tube in place, courses below the diaphragm with tip obscured. Overlying defibrillator pads. LUNGS AND PLEURA: Low lung volumes. Mild right basilar atelectasis. Similar left retrocardiac airspace opacity, likely atelectasis. No pulmonary edema. No pleural effusion. No pneumothorax. HEART AND MEDIASTINUM: No acute abnormality of the cardiac and mediastinal silhouettes. BONES AND SOFT TISSUES: Median sternotomy and CABG changes noted. IMPRESSION: 1. Low lung volumes with mild basilar atelectasis/retrocardiac atelectasis. 2. Support devices appropriately positioned, including right IJ central venous catheter with tip in the SVC, endotracheal tube 3.7 cm above the carina, and enteric tube coursing below the diaphragm. Electronically signed by: Waddell Calk MD 12/17/2023 06:09 AM EDT RP Workstation: [id number]    CT CYSTOGRAM ABD/PELVIS Result Date: 12/16/2023 CLINICAL DATA:  Sepsis with concern for bladder perforation. EXAM: CT CYSTOGRAM (CT ABDOMEN AND PELVIS WITH CONTRAST) TECHNIQUE: Multi-detector CT imaging through the abdomen and pelvis was performed after dilute contrast had been introduced into the bladder for the purposes of performing CT cystography. RADIATION DOSE REDUCTION: This exam was performed according to the departmental dose-optimization program which includes automated exposure control, adjustment of the mA and/or kV according to patient size and/or use of iterative reconstruction technique. CONTRAST:  50mL OMNIPAQUE  IOHEXOL  300 MG/ML  SOLN COMPARISON:  12/16/2023 FINDINGS: Lower chest: Atelectasis or consolidation in both lung bases. Hepatobiliary: Cholelithiasis.  No bile duct dilatation. Pancreas: Unremarkable. No pancreatic ductal dilatation or surrounding inflammatory changes. Spleen: Normal in size without focal abnormality. Adrenals/Urinary Tract: Left  adrenal gland nodules. See previous description from prior CT earlier today. Kidneys are symmetrical. No hydronephrosis or hydroureter. Contrast material fills the bladder. Foley catheter in the bladder. No bladder wall thickening. Contrast material is demonstrated leaking along the catheter through the urethra to the distal penile region. There is focal irregularity at the junction of the urethra with the bladder base with small anterior contrast pooling suggesting contained leak. No contrast extravasation to suggest intraperitoneal leakage. Remainder the urethra appears otherwise intact. Stomach/Bowel: Enteric tube tip in the stomach. Dilated fluid-filled small bowel demonstrates some decompression since previous study. Colon is decompressed. Persistent wall thickening in the terminal ileum and colon. Vascular/Lymphatic: Aortic atherosclerosis. No enlarged abdominal or pelvic lymph nodes. Reproductive: Prostate gland is surgically absent. No  significant pelvic lymphadenopathy. Other: Multiple loculated fluid collections demonstrated in the right abdomen and pelvis as previously discussed. Small amount of free fluid. Diffuse stranding throughout the intra-abdominal fat. Changes are consistent with multiple abscesses. Musculoskeletal: No acute bony abnormalities. No focal bone lesions. IMPRESSION: 1. CT cystogram with Foley catheter in place. Contrast material leaks along the catheter through the urethra. There is focal irregularity at the junction of the bladder base and urethra with small anterior contrast pooling suggesting contained perforation. No intraperitoneal contrast extravasation. 2. Appearance examination is otherwise unchanged. Again demonstrated are multiple loculated fluid collections in the abdomen and pelvis consistent with abscesses. Wall thickening of the cecum and terminal ileum. Fluid-filled dilated small bowel appear mildly improved. Electronically Signed   By: Elsie Gravely M.D.   On: 12/16/2023 22:41   CT Angio Chest Pulmonary Embolism (PE) W or WO Contrast Result Date: 12/16/2023 CLINICAL DATA:  Aortic aneurysm suspected. Severe right-sided abdominal pain and abdominal distention. Decreased oxygen saturation. EXAM: CT ANGIOGRAPHY CHEST CT ABDOMEN AND PELVIS WITH CONTRAST TECHNIQUE: Multidetector CT imaging of the chest was performed using the standard protocol during bolus administration of intravenous contrast. Multiplanar CT image reconstructions and MIPs were obtained to evaluate the vascular anatomy. Multidetector CT imaging of the abdomen and pelvis was performed using the standard protocol during bolus administration of intravenous contrast. RADIATION DOSE REDUCTION: This exam was performed according to the departmental dose-optimization program which includes automated exposure control, adjustment of the mA and/or kV according to patient size and/or use of iterative reconstruction technique. CONTRAST:  75mL OMNIPAQUE   IOHEXOL  350 MG/ML SOLN COMPARISON:  Chest radiograph 12/16/2023.  PET-CT 06/23/2023 FINDINGS: CTA CHEST FINDINGS Cardiovascular: Cardiac enlargement. No pericardial effusions. Normal caliber thoracic aorta. No aortic dissection. Calcification of the aorta and coronary arteries. Great vessel origins are patent. Postoperative changes consistent with coronary bypass. No evidence of significant pulmonary embolus. Mediastinum/Nodes: Enteric tube with tip in the stomach. Esophagus is decompressed. Endotracheal tube with tip above the carina. Thyroid gland is unremarkable. Scattered mediastinal lymph nodes are not pathologically enlarged, likely reactive. Lungs/Pleura: Consolidation or atelectasis in both lung bases, possibly pneumonia, compressive atelectasis, or aspiration. Small bilateral pleural effusions. No pneumothorax. Musculoskeletal: Degenerative changes in the spine. Sternotomy wires. No acute bony abnormalities. Review of the MIP images confirms the above findings. CT ABDOMEN and PELVIS FINDINGS Hepatobiliary: No focal liver lesions. Cholelithiasis with several stones in the gallbladder. No wall thickening or inflammatory stranding. No bile duct dilatation. Pancreas: Unremarkable. No pancreatic ductal dilatation or surrounding inflammatory changes. Spleen: Normal in size without focal abnormality. Adrenals/Urinary Tract: Left adrenal gland nodule measuring 2.1 cm diameter. Sixty-five Hounsfield unit density measurement. The lesions are unchanged since prior unenhanced study from 12/18/2020 at which time the density measurement  was -3 Hounsfield units. This is consistent with a benign fat containing adenoma and no imaging follow-up is indicated. Kidneys are symmetrical and homogeneous. No hydronephrosis or hydroureter. Bladder is decompressed. Stomach/Bowel: Stomach is unremarkable. Colon is decompressed. Wall thickening of the ascending colon and hepatic flexure, new since prior study. This may indicate colitis  or inflammatory bowel disease versus reactive change. Dilated fluid-filled small bowel with air-fluid levels and mild diffuse wall thickening. Terminal ileum is decompressed. This is consistent with small-bowel obstruction, possibly due to inflammatory strictures. Vascular/Lymphatic: Aortic atherosclerosis. No enlarged abdominal or pelvic lymph nodes. Reproductive: Prostate is unremarkable. Other: Diffuse infiltration throughout the right-sided abdominal and pelvic mesenteric fat. Multiple loculated fluid collections are demonstrated throughout the right abdomen and pelvis. A right pelvic collection measures 7.4 cm diameter. A right anterior lower quadrant collection measures 8.8 cm diameter. A right anterior para psoas collection measures 9.4 cm diameter. Multiple smaller right upper quadrant collections are demonstrated. Some collections contains small gas bubbles. This appearance is likely indicate multiple abscesses, possibly due to perforation. Could also consider a perforated appendicitis. Small amount of free fluid in the abdomen, likely ascites or reactive fluid. No free intra-abdominal gas. Musculoskeletal: Degenerative changes in the spine. No acute bony abnormalities. Review of the MIP images confirms the above findings. IMPRESSION: 1. Fluid-filled distended small bowel with decompressed terminal ileum consistent with small-bowel obstruction. 2. Irregular wall thickening diffusely throughout the distal small bowel and right colon suggesting enterocolitis or inflammatory bowel disease. Inflammatory strictures may be present. 3. Multiple loculated fluid collections, some with gas, demonstrated in the right abdomen and pelvis indicating abscesses. This is likely due to perforation, possibly related to inflammatory bowel disease or to an occult perforated appendicitis. The appendix is self is not demonstrated. No free intra-abdominal air. 4. Small amount of free fluid in the abdomen is likely reactive. 5.  Cholelithiasis without evidence of acute cholecystitis. 6. Aortic atherosclerosis. 7. Consolidation versus compressive atelectasis in both lung bases. Small bilateral pleural effusions. Critical Value/emergent results were called by telephone at the time of interpretation on 12/16/2023 at 5:28 pm to provider MATTHEW TRIFAN , who verbally acknowledged these results. Electronically Signed   By: Elsie Gravely M.D.   On: 12/16/2023 17:33   CT ABDOMEN PELVIS W CONTRAST Result Date: 12/16/2023 CLINICAL DATA:  Aortic aneurysm suspected. Severe right-sided abdominal pain and abdominal distention. Decreased oxygen saturation. EXAM: CT ANGIOGRAPHY CHEST CT ABDOMEN AND PELVIS WITH CONTRAST TECHNIQUE: Multidetector CT imaging of the chest was performed using the standard protocol during bolus administration of intravenous contrast. Multiplanar CT image reconstructions and MIPs were obtained to evaluate the vascular anatomy. Multidetector CT imaging of the abdomen and pelvis was performed using the standard protocol during bolus administration of intravenous contrast. RADIATION DOSE REDUCTION: This exam was performed according to the departmental dose-optimization program which includes automated exposure control, adjustment of the mA and/or kV according to patient size and/or use of iterative reconstruction technique. CONTRAST:  75mL OMNIPAQUE  IOHEXOL  350 MG/ML SOLN COMPARISON:  Chest radiograph 12/16/2023.  PET-CT 06/23/2023 FINDINGS: CTA CHEST FINDINGS Cardiovascular: Cardiac enlargement. No pericardial effusions. Normal caliber thoracic aorta. No aortic dissection. Calcification of the aorta and coronary arteries. Great vessel origins are patent. Postoperative changes consistent with coronary bypass. No evidence of significant pulmonary embolus. Mediastinum/Nodes: Enteric tube with tip in the stomach. Esophagus is decompressed. Endotracheal tube with tip above the carina. Thyroid gland is unremarkable. Scattered  mediastinal lymph nodes are not pathologically enlarged, likely reactive. Lungs/Pleura: Consolidation or atelectasis in  both lung bases, possibly pneumonia, compressive atelectasis, or aspiration. Small bilateral pleural effusions. No pneumothorax. Musculoskeletal: Degenerative changes in the spine. Sternotomy wires. No acute bony abnormalities. Review of the MIP images confirms the above findings. CT ABDOMEN and PELVIS FINDINGS Hepatobiliary: No focal liver lesions. Cholelithiasis with several stones in the gallbladder. No wall thickening or inflammatory stranding. No bile duct dilatation. Pancreas: Unremarkable. No pancreatic ductal dilatation or surrounding inflammatory changes. Spleen: Normal in size without focal abnormality. Adrenals/Urinary Tract: Left adrenal gland nodule measuring 2.1 cm diameter. Sixty-five Hounsfield unit density measurement. The lesions are unchanged since prior unenhanced study from 12/18/2020 at which time the density measurement was -3 Hounsfield units. This is consistent with a benign fat containing adenoma and no imaging follow-up is indicated. Kidneys are symmetrical and homogeneous. No hydronephrosis or hydroureter. Bladder is decompressed. Stomach/Bowel: Stomach is unremarkable. Colon is decompressed. Wall thickening of the ascending colon and hepatic flexure, new since prior study. This may indicate colitis or inflammatory bowel disease versus reactive change. Dilated fluid-filled small bowel with air-fluid levels and mild diffuse wall thickening. Terminal ileum is decompressed. This is consistent with small-bowel obstruction, possibly due to inflammatory strictures. Vascular/Lymphatic: Aortic atherosclerosis. No enlarged abdominal or pelvic lymph nodes. Reproductive: Prostate is unremarkable. Other: Diffuse infiltration throughout the right-sided abdominal and pelvic mesenteric fat. Multiple loculated fluid collections are demonstrated throughout the right abdomen and pelvis. A  right pelvic collection measures 7.4 cm diameter. A right anterior lower quadrant collection measures 8.8 cm diameter. A right anterior para psoas collection measures 9.4 cm diameter. Multiple smaller right upper quadrant collections are demonstrated. Some collections contains small gas bubbles. This appearance is likely indicate multiple abscesses, possibly due to perforation. Could also consider a perforated appendicitis. Small amount of free fluid in the abdomen, likely ascites or reactive fluid. No free intra-abdominal gas. Musculoskeletal: Degenerative changes in the spine. No acute bony abnormalities. Review of the MIP images confirms the above findings. IMPRESSION: 1. Fluid-filled distended small bowel with decompressed terminal ileum consistent with small-bowel obstruction. 2. Irregular wall thickening diffusely throughout the distal small bowel and right colon suggesting enterocolitis or inflammatory bowel disease. Inflammatory strictures may be present. 3. Multiple loculated fluid collections, some with gas, demonstrated in the right abdomen and pelvis indicating abscesses. This is likely due to perforation, possibly related to inflammatory bowel disease or to an occult perforated appendicitis. The appendix is self is not demonstrated. No free intra-abdominal air. 4. Small amount of free fluid in the abdomen is likely reactive. 5. Cholelithiasis without evidence of acute cholecystitis. 6. Aortic atherosclerosis. 7. Consolidation versus compressive atelectasis in both lung bases. Small bilateral pleural effusions. Critical Value/emergent results were called by telephone at the time of interpretation on 12/16/2023 at 5:28 pm to provider MATTHEW TRIFAN , who verbally acknowledged these results. Electronically Signed   By: Elsie Gravely M.D.   On: 12/16/2023 17:33   CT HEAD WO CONTRAST Result Date: 12/16/2023 CLINICAL DATA:  Altered mental status. EXAM: CT HEAD WITHOUT CONTRAST TECHNIQUE: Contiguous axial  images were obtained from the base of the skull through the vertex without intravenous contrast. RADIATION DOSE REDUCTION: This exam was performed according to the departmental dose-optimization program which includes automated exposure control, adjustment of the mA and/or kV according to patient size and/or use of iterative reconstruction technique. COMPARISON:  May 10, 2022 FINDINGS: Brain: No evidence of acute infarction, hemorrhage, hydrocephalus, extra-axial collection or mass lesion/mass effect. There are areas of mildly decreased attenuation within the white matter tracts of the  supratentorial brain, consistent with microvascular disease changes. Vascular: No hyperdense vessel or unexpected calcification. Skull: Normal. Negative for fracture or focal lesion. Sinuses/Orbits: No acute finding. Other: None. IMPRESSION: No acute intracranial pathology. Electronically Signed   By: Suzen Dials M.D.   On: 12/16/2023 17:17   DG Chest Port 1 View Result Date: 12/16/2023 CLINICAL DATA:  [id number] Encounter for central line placement [id number] EXAM: PORTABLE CHEST - 1 VIEW COMPARISON:  12/16/2023, 07/16/2023 FINDINGS: Similarly positioned endotracheal and esophagogastric tubes. Interval placement of a right IJ approach central venous catheter, which terminates at the cavoatrial junction. Improved aeration of the lungs. Retrocardiac airspace opacities. No pleural effusion or pneumothorax. No cardiomegaly. Sternotomy wires and CABG markers. Defibrillator pads. IMPRESSION: 1. Interval placement of a right IJ approach central venous catheter, which terminates at the cavoatrial junction. No pneumothorax. 2. Improved aeration of the lungs with retrocardiac airspace opacities, possibly atelectasis or aspiration. Electronically Signed   By: Rogelia Myers M.D.   On: 12/16/2023 16:28   DG Chest Port 1 View Result Date: 12/16/2023 CLINICAL DATA:  Central line and ET tube placement EXAM: PORTABLE CHEST - 1 VIEW  COMPARISON:  July 16, 2023 FINDINGS: Endotracheal tube terminates in the mid trachea. Esophagogastric tube terminates in the region of the stomach. Overlying defibrillator pads. Markedly low lung volumes. No focal airspace consolidation, pleural effusion, or pneumothorax. No cardiomegaly. IMPRESSION: 1. Well-positioned endotracheal and esophagogastric tubes. No pneumothorax. 2. Markedly low lung volumes.  No pneumonia or pleural effusion. Electronically Signed   By: Rogelia Myers M.D.   On: 12/16/2023 16:26    EKG: 9/27 showed NSR at 87 bpm (personally reviewed)  TELEMETRY: NSR 60-80s with occasional PVCs; 1 brief NSVT but only 3 beats (personally reviewed)  Assessment/Plan:  WCT/VT H/o CAD s/p CABG 2024 Echo 12/18/2023 with normal EF at 50-55% without WMA noted.  Given Abdominal Abscess and now HD, not currently a candidate for Transvenous device.   With severe electrolyte derangement and acute, critical illness would consider this a potentially reversible cause and not immediately plan for ICD.   Can likely stop amiodarone  and follow his rhythms. If he were to have additional VT, would proceed with ICD at that time, potentially S-ICD given now starting HD.   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   Intraabdominal abscess Septic Shock Ileus Ileus has resolved and infection has improved with ABx.  Surgery has signed off  H/o Post op AF NSR currently.     For questions or updates, please contact [facility] Please consult [email or web address] for contact info under     Signed, Ozell Prentice Passey, PA-C  12/26/2023, 4:31 PM

## 2023-12-26 NOTE — Progress Notes (Signed)
   Subjective/Chief Complaint: Tol diet has bowel function   Objective: Vital signs in last 24 hours: Temp:  [97.9 F (36.6 C)-98.8 F (37.1 C)] 97.9 F (36.6 C) (10/06 0710) Pulse Rate:  [75-90] 82 (10/06 0710) Resp:  [18-22] 20 (10/06 0710) BP: (110-158)/(77-87) 158/87 (10/06 0710) SpO2:  [92 %-97 %] 93 % (10/06 0710) Last BM Date : 12/24/23  Intake/Output from previous day: 10/05 0701 - 10/06 0700 In: 345 [P.O.:240; IV Piggyback:100] Out: 1630 [Urine:1100; Drains:30; Stool:500] Intake/Output this shift: No intake/output data recorded.  Ab soft nondistended nontender  Lab Results:  Recent Labs    12/23/23 1129  HGB 10.5*  HCT 31.0*   BMET Recent Labs    12/25/23 0630 12/26/23 0316  NA 135 133*  K 4.1 4.5  CL 95* 95*  CO2 23 20*  GLUCOSE 113* 99  BUN 89* 114*  CREATININE 5.56* 6.71*  CALCIUM  7.8* 8.0*   PT/INR No results for input(s): LABPROT, INR in the last 72 hours. ABG Recent Labs    12/23/23 1129  PHART 7.374  HCO3 24.7    Studies/Results: No results found.  Anti-infectives: Anti-infectives (From admission, onward)    Start     Dose/Rate Route Frequency Ordered Stop   12/21/23 1600  meropenem (MERREM) 1 g in sodium chloride  0.9 % 100 mL IVPB        1 g 200 mL/hr over 30 Minutes Intravenous Every 24 hours 12/21/23 0853     12/19/23 1045  ceFEPIme (MAXIPIME) 2 g in sodium chloride  0.9 % 100 mL IVPB  Status:  Discontinued        2 g 200 mL/hr over 30 Minutes Intravenous Every 12 hours 12/19/23 0955 12/21/23 0843   12/19/23 1045  metroNIDAZOLE  (FLAGYL ) IVPB 500 mg  Status:  Discontinued        500 mg 100 mL/hr over 60 Minutes Intravenous Every 12 hours 12/19/23 0955 12/21/23 0843   12/17/23 1600  piperacillin -tazobactam (ZOSYN ) IVPB 2.25 g  Status:  Discontinued        2.25 g 100 mL/hr over 30 Minutes Intravenous Every 8 hours 12/17/23 0931 12/17/23 1144   12/17/23 1600  piperacillin -tazobactam (ZOSYN ) IVPB 3.375 g  Status:   Discontinued        3.375 g 100 mL/hr over 30 Minutes Intravenous Every 6 hours 12/17/23 1157 12/19/23 0955   12/17/23 1230  piperacillin -tazobactam (ZOSYN ) IVPB 3.375 g  Status:  Discontinued        3.375 g 100 mL/hr over 30 Minutes Intravenous Every 6 hours 12/17/23 1144 12/17/23 1157   12/17/23 1000  linezolid  (ZYVOX ) IVPB 600 mg  Status:  Discontinued        600 mg 300 mL/hr over 60 Minutes Intravenous Every 12 hours 12/16/23 1924 12/19/23 0958   12/17/23 0000  piperacillin -tazobactam (ZOSYN ) IVPB 3.375 g  Status:  Discontinued        3.375 g 12.5 mL/hr over 240 Minutes Intravenous Every 8 hours 12/16/23 1924 12/17/23 0931   12/16/23 1600  piperacillin -tazobactam (ZOSYN ) IVPB 3.375 g        3.375 g 12.5 mL/hr over 240 Minutes Intravenous  Once 12/16/23 1551 12/16/23 1602   12/16/23 1600  vancomycin  (VANCOREADY) IVPB 1750 mg/350 mL        1,750 mg 175 mL/hr over 120 Minutes Intravenous  Once 12/16/23 1551 12/16/23 1816       Assessment/Plan: Resolved ileus Care per urology Soft diet Signing off  Glenn Stewart 12/26/2023

## 2023-12-26 NOTE — Progress Notes (Signed)
 Admit: 12/16/2023 LOS: 10  34M with AKI after presenting with encephalopathy, A-fib with RVR/ventricular tachycardia, found to have likely abdominal urinomas with abscess, now requiring RRT TTS with some signs of renal recovery.    Subjective:  UOP 1100 mL. No  new issues - seems to be eating well and in good spirits Family/friend Rex bedside    10/05 0701 - 10/06 0700 In: 345 [P.O.:240; IV Piggyback:100] Out: 1630 [Urine:1100; Drains:30; Stool:500]  Filed Weights   12/24/23 0355 12/24/23 0745 12/24/23 1215  Weight: 92.2 kg 92.2 kg 90 kg    Scheduled Meds:  (feeding supplement) PROSource Plus  30 mL Oral TID AC   amiodarone   400 mg Oral BID   [START ON 12/29/2023] amiodarone   400 mg Oral Daily   amLODipine   5 mg Oral Daily   bisacodyl   10 mg Rectal Daily   Chlorhexidine  Gluconate Cloth  6 each Topical Daily   feeding supplement  1 Container Oral TID BM   Gerhardt's butt cream   Topical BID   heparin  injection (subcutaneous)  5,000 Units Subcutaneous Q8H   insulin  aspart  0-15 Units Subcutaneous TID WC   metoprolol  tartrate  25 mg Oral BID   multivitamin  1 tablet Oral QHS   sodium chloride  flush  10-40 mL Intracatheter Q12H   sodium chloride  flush  5 mL Intracatheter Q8H   thiamine  100 mg Oral Daily   Continuous Infusions:  meropenem (MERREM) IV Stopped (12/25/23 2315)   PRN Meds:.mouth rinse, sodium chloride  flush  Current Labs: reviewed    Physical Exam:  Blood pressure (!) 158/87, pulse 82, temperature 97.9 F (36.6 C), temperature source Oral, resp. rate 20, height 5' 5 (1.651 m), weight 90 kg, SpO2 93%.  GEN: NAD, on RA ENT: LIJ CV: Regular, normal S1 and S2 PULM: BLBS ABD: soft EXT: no LEE  A AKI, anuric, baseline normal GFR.  No evidence of obstruction on imaging at presentation.  Suspect ATN.  Repeat bladder pressure not consistent with abdominal compartment syndrome on 9/27.  CT A/P w contrast, 10/1: Renal unremarkable. Now nonoliguric.   Hyponatremia, mild Hyperkalemia: Likely due to renal failure.  Resolved Abdominal fluid collections concerning for urinoma after radical prostatectomy 11/14/2023; status post drain placement 9/27 by IR with more than 1 L output since placement Ileus versus SBO, surgery following, ileus favored and now resolved VDRF resolved A-fib with RVR and V. tach at presentation, cardiology following on amiodarone  Septic shock on linezolid  and Zosyn , Off pressors, improving Hypertension: On amlodipine  5 daily, Lopressor  25 BID.  BP somewhat labile but generally ok Hyperphosphatemia setting of AKI, improved with HD Metabolic acidosis, improved on HD. Recurrent fevers, improved   P Stopped CRRT 9/30.  1st HD, 10/1, last 10/4 Assess for HD Tuesday - based on labs today likely will be necessary For [facility] tomorrow per primary Alerted team to initiate outpt AKI HD CLIP  Daily weights, Daily Renal Panel, Strict I/Os, Avoid nephrotoxins (NSAIDs, judicious IV Contrast)  Medication Issues; Preferred narcotic agents for pain control are hydromorphone , fentanyl , and methadone. Morphine  should not be used.  Baclofen should be avoided Avoid oral sodium phosphate  and magnesium  citrate based laxatives / bowel preps     Dr. Manuelita DELENA Barters  12/26/2023, 8:44 AM  Recent Labs  Lab 12/24/23 0512 12/25/23 0630 12/26/23 0316  NA 134* 135 133*  K 4.1 4.1 4.5  CL 98 95* 95*  CO2 19* 23 20*  GLUCOSE 202* 113* 99  BUN 113* 89* 114*  CREATININE 5.92* 5.56* 6.71*  CALCIUM  7.5* 7.8* 8.0*  PHOS 6.4* 6.4* 8.9*   Recent Labs  Lab 12/20/23 0414 12/21/23 0432 12/22/23 0359 12/23/23 0638 12/23/23 1129  WBC 13.8* 16.9* 13.7* 11.0*  --   NEUTROABS 10.9* 13.5* 10.7*  --   --   HGB 10.6* 10.5* 10.6* 11.3* 10.5*  HCT 31.4* 32.0* 31.8* 35.1* 31.0*  MCV 80.3 81.4 80.1 81.4  --   PLT 281 310 338 359  --

## 2023-12-26 NOTE — Consult Note (Addendum)
 WOC Nurse wound follow up Refer to previous WOC consult on 10/1. Previously noted Deep tissue pressure injury is beginning to evolve into an Unstageable pressure injury with areas of slough to the edges across bilat buttocks/sacrum, approx 6X6 cm. Small amt tan drainage. It is difficult to keep the affected area from becoming soiled related to incontinent liquid stool.  Flexiseal has been inserted to attempt to contain the stool, but mod amt still leaks around the insertion site.  Topical treatment orders provided for bedside nurses to perform as follows to assist with removal of nonviable tissue: Apply Medihoney to bilat buttocks/sacrum Q day, then cover with foam dressing. Change foam dressing Q 3 days or PRN soiling WOC team will reassess Q 7-10 days to determine if a change in the plan of care is indicated at that time.   Thank-you,  Stephane Fought MSN, RN, CWOCN, CWCN-AP, CNS Contact Mon-Fri 0700-1500: [phone]

## 2023-12-26 NOTE — Progress Notes (Signed)
 Progress Note   Patient: Glenn Stewart FMW:[id number] DOB: 1965/10/25 DOA: 12/16/2023     10 DOS: the patient was seen and examined on 12/26/2023   Brief hospital course: Glenn Stewart was admitted to the hospital with intra abdominal abscess complicated with shock.   82 yoM with PMH of HTN, CAD, CABG x 5 04/2022, HTN, HLD, GERD, NASH, bipolar/ ADHD and s/p recent robotic assisted laparoscopic radial prostatectomy on 11/17/23 who presented for weakness and waxing mental status with distended and tender abdominal by EMS.  Has found with episodes of Afib with RVR and VT and was transported to the ED. In the ED he was hemodynamically unstable on arrival, underwent emergent cardioversion and given amiodarone  x2 and magnesium , with emerging rhythm of afib with RVR possible flutter.    Post cardioversion became hypotensive with respiratory distress therefore intubated and placed on norepinephrine .   Difficulty oxygenating post intubation with normal PIP requiring 1.0 and 12 PEEP.    Labs noted for K2.8, Na 129, bicarb 16, BUN/ sCr 55/ 3.92 (recently  ), t. Bili 3.4, AST 68, albumin  1.6, protein 3.4, INR 1.2.  CTH neg,   CT abdomen and pelvis with fluid filled distended small bowel with decompressed terminal ileum, consistent with small bowel obstruction.  Irregular wall thickening diffusely throughout the distal small bowel and right colon suggesting enterocolitis or inflammatory bowel disease.  Multiple loculated fluid collections, some with gas, demonstrated in the right abdomen and pelvis indicating abscess. This is likely due to perforation, possible related to inflammatory bowel disease or to an occult perforated appendicitis.  Consolidation versus compressive atelectasis in both lung bases. Small pleural effusions.   Patient placed on empiric zosyn / vancomycin .    NOTE:LD him as outpatient, patient self-irrigated catheter a couple days after initial surgery and then removed catheter himself  prior to follow-up at 1 week. Patient reported that he cut tubing prior to removal but unable to confirm if removal was truly atraumat   Patient was had NG tube place and TPN was started.         09/27 s/p IR drainage of RLQ/pelvic collection (Cx abundant staph); 300 mL  9/28 - On vent. Ongoing CRRT. Seen by surgery team - still concernd about Urinoma and secondary bowel irritation and not a true obstruction. NGT output is low c/w ileus. CCS does  NOT want enteral medication. Recommnding max only trophic feds. CUlture negative. Afebrile.  Stopped on Zyvox  and Zosyn  9/29 SBO Xr protocol per Gen Surgery with oral contrast pooling in the gastric fundus. MSSA in abdominal cultures and Enterobacter cloacae in Ucx - on Cefepime and Flagyl  SBT trial failed secondary to RR >40 and increased WOB. Back on PRVC 9/30 discontinued CRRT.  10/1 - switched to meropenem. Repeat CT A/P with stable intraabdominal abscesses. Started iHD 10/03 liberated from mechanical ventilation.   10/05 transfer to [facility].  10/06 plan for HD tunneled catheter tomorrow   Assessment and Plan: * Intra-abdominal abscess (HCC) Septic shock resolved.  Multiple abdominal abscesses due to MSSA.  Urinary tract infection due to enterobacter cloacae.  Initially placed on metronidazole  and cefepime, then because persistent fever has been changed to meropenem.  Follow up CT abdomen and pelvis with stable or improvement in abdominal abscess.   Follow up cell count is 11.0  Cultures from blood have been no growth.   Plan to continue antibiotic therapy with meropenem (started on 10/01).  Will plan for now to continue antibiotic therapy until 10/08   PAF (paroxysmal  atrial fibrillation) (HCC) Ventricular tachycardia sp cardioversion.  Patient has been placed on amiodarone  with good toleration.  Continue with metoprolol  tartrate 25 mg po bid.  Patient not on anticoagulation. Continue telemetry monitoring EP has been consulted.    Coronary artery disease No chest pain, no acute coronary syndrome.   Essential hypertension Continue blood pressure control with amlodipine  and metoprolol .   Bilateral pneumonia (Present on admission)  Acute hypoxemic and hypercapnic respiratory failure.  Moderate ARDS.  Required invasive mechanical ventilation.   Completed antibiotic therapy 02 saturation today is 100% on room air.   AKI (acute kidney injury) ATN. Hyponatremia hyperkalemia.  09/30 Stopped CRRT  10/01 started intermittent hemodialysis.   Urine output is 1100 cc  Foley catheter in place  Today BUN  is 114 with K at 4.5 and serum bicarbonate at 20  Na 133   Plan for tunneled HD access on tomorrow.   Patient continue renal replacement therapy with hemodialysis, per nephrology recommendations   Ileus [facility]) Patient has been weaned off TPN and now tolerating po well.  Ileus has resolved, advanced to soft today.  Surgery has signed off  Acute metabolic encephalopathy Mentation has been improving, continue with mild confusion but not agitation. Continue nutritional support, PT and OT.  Neuro checks per unit protocol.   Prostate cancer [facility]) Sp robotic assisted laparoscopic prostatectomy on 11/17/23 Continue foley catheter, plan to repeat cystogram 10/7 to 10/10 per urology.    Class 2 obesity with body mass index (BMI) of 35 to 39.9 without comorbidity Calculated BMI is 33.0         Subjective: Patient with no chest pain and no dyspnea, continue very weak and deconditioned   Physical Exam: Vitals:   12/26/23 0511 12/26/23 0710 12/26/23 1101 12/26/23 1615  BP: (!) 144/78 (!) 158/87 (!) 135/92 (!) 152/82  Pulse: 75 82 62 87  Resp: 20 20 20 20   Temp: 98.8 F (37.1 C) 97.9 F (36.6 C) 98.3 F (36.8 C) 98 F (36.7 C)  TempSrc: Oral Oral Oral Oral  SpO2: 92% 93% 100% 100%  Weight:      Height:       Neurology awake and alert ENT with mild pallor with no icterus Cardiovascular with S1 and  S2 present and regular with no gallops rubs or murmurs No JVD Respiratory with mild rales bilaterally with no wheezing or rhonchi Abdomen is soft and non tender, not distended Trace lower extremity edema  Data Reviewed:    Family Communication: no family at the bedside   Disposition: Status is: Inpatient Remains inpatient appropriate because: pending HD   Planned Discharge Destination: Home/ SNF     Author: Elidia Toribio Furnace, MD 12/26/2023 4:49 PM  For on call review [email or web address].

## 2023-12-26 NOTE — Progress Notes (Signed)
  Progress Note  Patient Name: Glenn Stewart Date of Encounter: 12/26/2023 [facility] Cardiologist: Redell Shallow, MD   Interval Summary   Laying comfortably in bed.  Denies any chest pain, shortness of breath, orthopnea.  Vital Signs Vitals:   12/25/23 1950 12/26/23 0005 12/26/23 0511 12/26/23 0710  BP: (!) 151/86 110/77 (!) 144/78 (!) 158/87  Pulse: 88 83 75 82  Resp: 20 20 20 20   Temp: 98.2 F (36.8 C) 98.6 F (37 C) 98.8 F (37.1 C) 97.9 F (36.6 C)  TempSrc: Oral Oral Oral Oral  SpO2: 97% 96% 92% 93%  Weight:      Height:        Intake/Output Summary (Last 24 hours) at 12/26/2023 0925 Last data filed at 12/26/2023 0855 Gross per 24 hour  Intake 582 ml  Output 2135 ml  Net -1553 ml      12/24/2023   12:15 PM 12/24/2023    7:45 AM 12/24/2023    3:55 AM  Last 3 Weights  Weight (lbs) 198 lb 6.6 oz 203 lb 4.2 oz 203 lb 4.2 oz  Weight (kg) 90 kg 92.2 kg 92.2 kg      Telemetry/ECG  Normal sinus rhythm with heart rates in the 70s to 90s- Personally Reviewed  Physical Exam  GEN: No acute distress.   Neck: No JVD Cardiac: RRR, no murmurs, rubs, or gallops.  Respiratory: Clear to auscultation bilaterally. GI: Soft, nontender, distended. MS: No edema.  Assessment & Plan   Intraabdominal abscess Septic shock ileus was admitted to the hospital with intra abdominal abscess complicated with shock. This has largely resolved, is off pressors, and surgery has signed off  Suspected VT. Had Sustained VT in ER on 12/16/23 requiring cardioversion with IV Mg and Amiodarone . After that was in afib vs flutter Continue oral amiodarone  400mg  BID till 10/8 and will transition to 400mg  daily. May consider stopping metoprolol  and starting COREG 12.5mg  BID. May consider EP consult prior to DC.   CAD s/p CABG x 5 in 04/2022 Hyperlipidemia Elevated troponin 82 > 95 Will likely need ischemic eval but will differ for now given has AKI requiring HD. May consider  starting atorvastatin .   Hypertension Most recent BP 158/87. Heart rates in the 70's to 90's. Continue amlodipine  10 mg daily Manage lopressor  as above.   Hypoxic respiratory failure -resolved extubated 10/3 SPO2 in the mid 90's on room air.   AKI  Undergoing intermittent HD per nephrology   Otherwise management per primary.        For questions or updates, please contact [facility] Please consult [email or web address] for contact info under        Signed, Akshaj Besancon, PA-C

## 2023-12-27 ENCOUNTER — Ambulatory Visit: Admitting: Family Medicine

## 2023-12-27 DIAGNOSIS — I4891 Unspecified atrial fibrillation: Secondary | ICD-10-CM

## 2023-12-27 DIAGNOSIS — I480 Paroxysmal atrial fibrillation: Secondary | ICD-10-CM | POA: Diagnosis not present

## 2023-12-27 DIAGNOSIS — I472 Ventricular tachycardia, unspecified: Secondary | ICD-10-CM | POA: Diagnosis not present

## 2023-12-27 DIAGNOSIS — K651 Peritoneal abscess: Secondary | ICD-10-CM | POA: Diagnosis not present

## 2023-12-27 DIAGNOSIS — I2585 Chronic coronary microvascular dysfunction: Secondary | ICD-10-CM

## 2023-12-27 DIAGNOSIS — I10 Essential (primary) hypertension: Secondary | ICD-10-CM | POA: Diagnosis not present

## 2023-12-27 LAB — GLUCOSE, CAPILLARY
Glucose-Capillary: 116 mg/dL — ABNORMAL HIGH (ref 70–99)
Glucose-Capillary: 121 mg/dL — ABNORMAL HIGH (ref 70–99)
Glucose-Capillary: 114 mg/dL — ABNORMAL HIGH (ref 70–99)

## 2023-12-27 LAB — RENAL FUNCTION PANEL
Albumin: 2 g/dL — ABNORMAL LOW (ref 3.5–5.0)
Anion gap: 18 — ABNORMAL HIGH (ref 5–15)
BUN: 143 mg/dL — ABNORMAL HIGH (ref 6–20)
CO2: 17 mmol/L — ABNORMAL LOW (ref 22–32)
Calcium: 7.8 mg/dL — ABNORMAL LOW (ref 8.9–10.3)
Chloride: 95 mmol/L — ABNORMAL LOW (ref 98–111)
Creatinine, Ser: 7.44 mg/dL — ABNORMAL HIGH (ref 0.61–1.24)
GFR, Estimated: 8 mL/min — ABNORMAL LOW (ref >60)
Glucose, Bld: 122 mg/dL — ABNORMAL HIGH (ref 70–99)
Phosphorus: 11.4 mg/dL — ABNORMAL HIGH (ref 2.5–4.6)
Potassium: 4.7 mmol/L (ref 3.5–5.1)
Sodium: 130 mmol/L — ABNORMAL LOW (ref 135–145)

## 2023-12-27 MED ORDER — LIDOCAINE HCL (PF) 1 % IJ SOLN: 5.0000 mL | INTRAMUSCULAR | Status: DC | PRN

## 2023-12-27 MED ORDER — ENSURE PLUS HIGH PROTEIN PO LIQD
237.0000 mL | Freq: Two times a day (BID) | ORAL | Status: DC
  Administered 2023-12-27 – 2024-01-02 (×6): 237 mL via ORAL

## 2023-12-27 MED ORDER — CARVEDILOL 12.5 MG PO TABS
12.5000 mg | ORAL_TABLET | Freq: Two times a day (BID) | ORAL | Status: DC
  Administered 2023-12-27 – 2024-01-02 (×9): 12.5 mg via ORAL
  Filled 2023-12-27 (×9): qty 1

## 2023-12-27 MED ORDER — HEPARIN SODIUM (PORCINE) 1000 UNIT/ML DIALYSIS: 1000.0000 [IU] | INTRAMUSCULAR | Status: DC | PRN

## 2023-12-27 MED ORDER — ATORVASTATIN CALCIUM 80 MG PO TABS
80.0000 mg | ORAL_TABLET | Freq: Every day | ORAL | Status: DC
  Administered 2023-12-27 – 2024-01-02 (×7): 80 mg via ORAL
  Filled 2023-12-27 (×7): qty 1

## 2023-12-27 MED ORDER — PENTAFLUOROPROP-TETRAFLUOROETH EX AERO: 1.0000 | INHALATION_SPRAY | CUTANEOUS | Status: DC | PRN

## 2023-12-27 MED ORDER — ALTEPLASE 2 MG IJ SOLR: 2.0000 mg | Freq: Once | INTRAMUSCULAR | Status: DC | PRN

## 2023-12-27 MED ORDER — SEVELAMER CARBONATE 800 MG PO TABS
1600.0000 mg | ORAL_TABLET | Freq: Three times a day (TID) | ORAL | Status: DC
  Administered 2023-12-27 – 2023-12-30 (×9): 1600 mg via ORAL
  Filled 2023-12-27 (×9): qty 2

## 2023-12-27 MED ORDER — LIDOCAINE-PRILOCAINE 2.5-2.5 % EX CREA: 1.0000 | TOPICAL_CREAM | CUTANEOUS | Status: DC | PRN

## 2023-12-27 NOTE — Progress Notes (Signed)
 Nutrition Follow-up  DOCUMENTATION CODES:   Not applicable  INTERVENTION:   Continue GI soft diet, advance as tolerated.  Recommend changing to Ensure Plus High Protein po BID.  30 ml ProSource Plus TID, each supplement provides 100 kcals and 15 grams protein.  Renal MVI daily  NUTRITION DIAGNOSIS:   Increased nutrient needs related to acute illness as evidenced by estimated needs. - ongoing   GOAL:   Patient will meet greater than or equal to 90% of their needs - meeting    MONITOR:   PO intake, Supplement acceptance, Diet advancement, Labs  REASON FOR ASSESSMENT:   Consult Enteral/tube feeding initiation and management  ASSESSMENT:   Pt with hx of CAD s/p CABGx5, HTN, HLD, GERD, atrial fibrillation, NASH, and s/p recent robotic assisted laparoscopic radial prostatectomy on 11/17/23 presented to ED from a urology appointment with poor level of consciousness.  09/26 - presented to ED, intubated, cardioversion 09/27 - drain placed in IR to RLQ, CRRT initiated 09/28 - trickles initiated 09/29 - TF held - SBO protocol ordered  09/30 - CRRT discontinued 10/01 - SBO protocol reinitiated, iHD 10/02 - TPN initiated  10/03 - TPN increased to goal, TF ordered at trickle but then extubated, unclear if pt received any TF  Patient seen in room. Pt reports tolerating diet advancement and has been eating > 75% of all meals in addition to ordered supplements. Plan is for patient to get HD tunneled catheter tomorrow. Discussed with patient importance of adequate protein intake now that he is requiring long term HD. No N/V or abd pain/discomfort with meals reporting, having bowel movements.   Labs:  Sodium 135 - 145 mmol/L 130 (L)  Potassium 3.5 - 5.1 mmol/L 4.7  Chloride 98 - 111 mmol/L 95 (L)  CO2 22 - 32 mmol/L 17 (L)  Glucose 70 - 99 mg/dL 877 (H)  BUN 6 - 20 mg/dL 856 (H)  Creatinine 9.38 - 1.24 mg/dL 2.55 (H)  Calcium  8.9 - 10.3 mg/dL 7.8 (L)  Anion gap 5 - 15  18 (H)   Phosphorus 2.5 - 4.6 mg/dL 88.5 (H)  Albumin  3.5 - 5.0 g/dL 2.0 (L)  GFR, Estimated >60 mL/min 8 (L)    Meds: Renal MVI Renvela TID with meals  Thiamine 100 mg daily Dulcolax   Diet Order:   Diet Order             Diet NPO time specified Except for: Sips with Meds  Diet effective midnight           DIET SOFT Fluid consistency: Thin  Diet effective now                   EDUCATION NEEDS:   Not appropriate for education at this time  Skin:  Skin Assessment: Skin Integrity Issues: Skin Integrity Issues:: DTI DTI: buttocks  Last BM:  10/7 type 7  Height:   Ht Readings from Last 1 Encounters:  12/24/23 5' 5 (1.651 m)    Weight:   Wt Readings from Last 1 Encounters:  12/27/23 93.4 kg    Ideal Body Weight:  61.8 kg  BMI:  Body mass index is 34.26 kg/m.  Estimated Nutritional Needs:   Kcal:  2000-2200 kcal/d  Protein:  95-115 g  Fluid:  2L/d  Glenn Budde, MS, RD, LDN Clinical Dietitian  Please see AMiON for contact information.

## 2023-12-27 NOTE — TOC Progression Note (Signed)
 Transition of Care [facility]) - Progression Note    Patient Details  Name: Glenn Stewart MRN: [id number] Date of Birth: Dec 27, 1965  [facility]) CM/SW Contact  Waddell Barnie Rama, RN Phone Number: 12/27/2023, 10:35 AM  Clinical Narrative:    Patient lives in [place], he states he did plan to go home with his mom in [place] for a couple of days but if need to have HD set up here he will just plan to go to his home in [place].   Expected Discharge Plan: Home/Self Care Barriers to Discharge: Continued Medical Work up               Expected Discharge Plan and Services       Living arrangements for the past 2 months: Single Family Home                                       Social Drivers of Health (SDOH) Interventions SDOH Screenings   Food Insecurity: No Food Insecurity (11/17/2023)  Housing: Low Risk  (11/17/2023)  Transportation Needs: No Transportation Needs (11/17/2023)  Utilities: Not At Risk (11/17/2023)  Tobacco Use: Low Risk  (12/16/2023)    Readmission Risk Interventions    05/19/2022   12:57 PM  Readmission Risk Prevention Plan  Transportation Screening Complete  PCP or Specialist Appt within 5-7 Days Complete  Home Care Screening Complete  Medication Review (RN CM) Complete

## 2023-12-27 NOTE — Plan of Care (Signed)
  Problem: Education: Goal: Knowledge of the procedure and recovery process will improve Outcome: Progressing   Problem: Clinical Measurements: Goal: Will remain free from infection Outcome: Progressing   Problem: Clinical Measurements: Goal: Diagnostic test results will improve Outcome: Progressing

## 2023-12-27 NOTE — Progress Notes (Signed)
 Admit: 12/16/2023 LOS: 11  57M with AKI after presenting with encephalopathy, A-fib with RVR/ventricular tachycardia, found to have likely abdominal urinomas with abscess, now requiring RRT TTS with some signs of renal recovery.    Subjective:  UOP 2250 mL. No  new issues For HD today    10/06 0701 - 10/07 0700 In: 477 [P.O.:477] Out: 2855 [Urine:2250; Drains:155; Stool:450]  Filed Weights   12/24/23 0745 12/24/23 1215 12/27/23 0515  Weight: 92.2 kg 90 kg 93.4 kg    Scheduled Meds:  (feeding supplement) PROSource Plus  30 mL Oral TID AC   amiodarone   400 mg Oral BID   [START ON 12/29/2023] amiodarone   400 mg Oral Daily   amLODipine   5 mg Oral Daily   bisacodyl   10 mg Rectal Daily   Chlorhexidine  Gluconate Cloth  6 each Topical Daily   feeding supplement  1 Container Oral TID BM   Gerhardt's butt cream   Topical BID   heparin  injection (subcutaneous)  5,000 Units Subcutaneous Q8H   insulin  aspart  0-15 Units Subcutaneous TID WC   leptospermum manuka honey  1 Application Topical Daily   metoprolol  tartrate  25 mg Oral BID   multivitamin  1 tablet Oral QHS   sodium chloride  flush  10-40 mL Intracatheter Q12H   sodium chloride  flush  5 mL Intracatheter Q8H   thiamine  100 mg Oral Daily   Continuous Infusions:  meropenem (MERREM) IV 1 g (12/26/23 2217)   PRN Meds:.mouth rinse, sodium chloride  flush  Current Labs: reviewed    Physical Exam:  Blood pressure (!) 157/85, pulse 79, temperature 97.8 F (36.6 C), resp. rate 18, height 5' 5 (1.651 m), weight 93.4 kg, SpO2 100%.  GEN: NAD, on RA Neck: temp LIJ CV: Regular, normal S1 and S2 PULM: BLBS ABD: soft EXT: no LEE  A AKI, anuric, baseline normal GFR.  No evidence of obstruction on imaging at presentation.  Suspect ATN.  Repeat bladder pressure not consistent with abdominal compartment syndrome on 9/27.  CT A/P w contrast, 10/1: Renal unremarkable. Now nonoliguric.  Hyponatremia, mild Abdominal fluid collections  concerning for urinoma after radical prostatectomy 11/14/2023; status post drain placement 9/27 by IR with more than 1 L output since placement Ileus versus SBO, surgery following, ileus favored and now resolved VDRF resolved A-fib with RVR and V. tach at presentation, cardiology following on amiodarone  Septic shock on linezolid  and Zosyn , Off pressors, improving Hypertension: On amlodipine  5 daily, Lopressor  25 BID.  BP somewhat labile but generally ok Hyperphosphatemia setting of AKI Metabolic acidosis, improving on HD. Anemia: mild Hb 10s   P Stopped CRRT 9/30.  1st HD, 10/1, last 10/4 Based on today's labs continues to need RRT for clearance but UOP is improving C/s IR for TDC  outpt AKI HD CLIP underway while we concurrently monitor for renal recovery Start phos binder renvela 2 TIDAC for phos 11, renal diet Daily weights, Daily Renal Panel, Strict I/Os, Avoid nephrotoxins (NSAIDs, judicious IV Contrast)  Medication Issues; Preferred narcotic agents for pain control are hydromorphone , fentanyl , and methadone. Morphine  should not be used.  Baclofen should be avoided Avoid oral sodium phosphate  and magnesium  citrate based laxatives / bowel preps     Dr. Manuelita DELENA Barters  12/27/2023, 9:00 AM  Recent Labs  Lab 12/25/23 0630 12/26/23 0316 12/27/23 0813  NA 135 133* 130*  K 4.1 4.5 4.7  CL 95* 95* 95*  CO2 23 20* 17*  GLUCOSE 113* 99 122*  BUN 89* 114*  143*  CREATININE 5.56* 6.71* 7.44*  CALCIUM  7.8* 8.0* 7.8*  PHOS 6.4* 8.9* 11.4*   Recent Labs  Lab 12/21/23 0432 12/22/23 0359 12/23/23 0638 12/23/23 1129  WBC 16.9* 13.7* 11.0*  --   NEUTROABS 13.5* 10.7*  --   --   HGB 10.5* 10.6* 11.3* 10.5*  HCT 32.0* 31.8* 35.1* 31.0*  MCV 81.4 80.1 81.4  --   PLT 310 338 359  --

## 2023-12-27 NOTE — Progress Notes (Addendum)
 Progress Note   Patient: Glenn Stewart FMW:[id number] DOB: 07-23-1965 DOA: 12/16/2023     11 DOS: the patient was seen and examined on 12/27/2023   Brief hospital course: Glenn Stewart was admitted to the hospital with intra abdominal abscess complicated with shock.   58 yoM with PMH of HTN, CAD, CABG x 5 04/2022, HTN, HLD, GERD, NASH, bipolar/ ADHD and s/p recent robotic assisted laparoscopic radial prostatectomy on 11/17/23 who presented for weakness and waxing mental status with distended and tender abdominal by EMS.  Has found with episodes of Afib with RVR and VT and was transported to the ED. In the ED he was hemodynamically unstable on arrival, underwent emergent cardioversion and given amiodarone  x2 and magnesium , with emerging rhythm of afib with RVR possible flutter.    Post cardioversion became hypotensive with respiratory distress therefore intubated and placed on norepinephrine .   Difficulty oxygenating post intubation with normal PIP requiring 1.0 and 12 PEEP.    Labs noted for K2.8, Na 129, bicarb 16, BUN/ sCr 55/ 3.92 (recently  ), t. Bili 3.4, AST 68, albumin  1.6, protein 3.4, INR 1.2.  CTH neg,   CT abdomen and pelvis with fluid filled distended small bowel with decompressed terminal ileum, consistent with small bowel obstruction.  Irregular wall thickening diffusely throughout the distal small bowel and right colon suggesting enterocolitis or inflammatory bowel disease.  Multiple loculated fluid collections, some with gas, demonstrated in the right abdomen and pelvis indicating abscess. This is likely due to perforation, possible related to inflammatory bowel disease or to an occult perforated appendicitis.  Consolidation versus compressive atelectasis in both lung bases. Small pleural effusions.   Patient placed on empiric zosyn / vancomycin .    NOTE:LD him as outpatient, patient self-irrigated catheter a couple days after initial surgery and then removed catheter himself  prior to follow-up at 1 week. Patient reported that he cut tubing prior to removal but unable to confirm if removal was truly atraumat   Patient was had NG tube place and TPN was started.         09/27 s/p IR drainage of RLQ/pelvic collection (Cx abundant staph); 300 mL  9/28 - On vent. Ongoing CRRT. Seen by surgery team - still concernd about Urinoma and secondary bowel irritation and not a true obstruction. NGT output is low c/w ileus. CCS does  NOT want enteral medication. Recommnding max only trophic feds. CUlture negative. Afebrile.  Stopped on Zyvox  and Zosyn  9/29 SBO Xr protocol per Gen Surgery with oral contrast pooling in the gastric fundus. MSSA in abdominal cultures and Enterobacter cloacae in Ucx - on Cefepime and Flagyl  SBT trial failed secondary to RR >40 and increased WOB. Back on PRVC 9/30 discontinued CRRT.  10/1 - switched to meropenem. Repeat CT A/P with stable intraabdominal abscesses. Started iHD 10/03 liberated from mechanical ventilation.   10/05 transfer to [facility].  10/06 plan for HD tunneled catheter  10/97 rectal tube has been removed.   Assessment and Plan: * Intra-abdominal abscess (HCC) Septic shock resolved.  Multiple abdominal abscesses due to MSSA.  Urinary tract infection due to enterobacter cloacae.  Initially placed on metronidazole  and cefepime, then because persistent fever has been changed to meropenem.  Follow up CT abdomen and pelvis with stable or improvement in abdominal abscess.   Follow up cell count is 11.0  Cultures from blood have been no growth.   Plan to continue antibiotic therapy with meropenem (started on 10/01).  Will plan for now to continue antibiotic therapy  until 10/08   PAF (paroxysmal atrial fibrillation) (HCC) Ventricular tachycardia sp cardioversion.   Continue with carvedilol 12.5 mg po bid.  Patient not on anticoagulation. Continue telemetry monitoring EP has been consulted with recommendations to stop amiodarone  and  continue close monitoring.   Coronary artery disease No chest pain, no acute coronary syndrome.   Essential hypertension Continue blood pressure control with amlodipine  and carvedilol.   Bilateral pneumonia (Present on admission)  Acute hypoxemic and hypercapnic respiratory failure.  Moderate ARDS.  Required invasive mechanical ventilation.   Completed antibiotic therapy 02 saturation today is 100% on room air.   AKI (acute kidney injury) ATN. Hyponatremia hyperkalemia.  09/30 Stopped CRRT  10/01 started intermittent hemodialysis.   Urine output is 2250 cc (foley in place)  BUN 143, cr 7,44, with K at 4.7 and serum bicarbonate at 17  Na 130   Plan for tunneled HD access.    Patient continue renal replacement therapy with hemodialysis, per nephrology recommendations   Ileus [facility]) Patient has been weaned off TPN and now tolerating po well.  Ileus has resolved, advanced to soft today.  Surgery has signed off  Acute metabolic encephalopathy Mentation has been improving, continue with mild confusion but not agitation. Continue nutritional support, PT and OT.  Neuro checks per unit protocol.   Prostate cancer [facility] robotic assisted laparoscopic prostatectomy on 11/17/23 Continue foley catheter, plan to repeat cystogram 10/7 to 10/10 per urology.    Class 2 obesity with body mass index (BMI) of 35 to 39.9 without comorbidity Calculated BMI is 33.0         Subjective: Patient with no chest pain and no dyspnea, no abdominal pain, no nausea or vomiting. No PND or orthopnea.   Physical Exam: Vitals:   12/27/23 0720 12/27/23 0916 12/27/23 1110 12/27/23 1115  BP: (!) 157/85  110/88   Pulse: 79 79    Resp: 18  20 20   Temp: 97.8 F (36.6 C)   97.8 F (36.6 C)  TempSrc:      SpO2: 100%     Weight:      Height:       Neurology awake and alert ENT with mild pallor Cardiovascular with S1 and S2 present and regular with no gallops, rubs or murmurs No  JVD Respiratory with no rales or wheezing, no rhonchi  Abdomen with no distention  No lower extremity edema Foley catheter in place.  Data Reviewed:    Family Communication: no family at the bedside   Disposition: Status is: Inpatient Remains inpatient appropriate because: renal replacement therapy   Planned Discharge Destination: Home/ SNF      Author: Elidia Toribio Furnace, MD 12/27/2023 2:41 PM  For on call review [email or web address].

## 2023-12-27 NOTE — Progress Notes (Addendum)
 Progress Note  Patient Name: Glenn Stewart Date of Encounter: 12/27/2023 [facility] Cardiologist: Redell Shallow, MD   Interval Summary   Denies any chest pain or shortness of breath  Vital Signs Vitals:   12/27/23 0340 12/27/23 0515 12/27/23 0720 12/27/23 0916  BP: (!) 147/82  (!) 157/85   Pulse: 83 83 79 79  Resp: 19 (!) 25 18   Temp: 97.6 F (36.4 C)  97.8 F (36.6 C)   TempSrc: Oral     SpO2: 100% 100% 100%   Weight:  93.4 kg    Height:        Intake/Output Summary (Last 24 hours) at 12/27/2023 0920 Last data filed at 12/27/2023 0900 Gross per 24 hour  Intake 360 ml  Output 2850 ml  Net -2490 ml      12/27/2023    5:15 AM 12/24/2023   12:15 PM 12/24/2023    7:45 AM  Last 3 Weights  Weight (lbs) 205 lb 14.4 oz 198 lb 6.6 oz 203 lb 4.2 oz  Weight (kg) 93.396 kg 90 kg 92.2 kg      Telemetry/ECG  Normal sinus rhythm with heart rates in the 70s to 90s- Personally Reviewed  Physical Exam  GEN: No acute distress.  Alert and orientated on room air. Neck: No JVD Cardiac: RRR, no murmurs, rubs, or gallops.  Respiratory: Clear to auscultation bilaterally. GI: Soft, nontender, abdomen distended MS: No edema  Assessment & Plan    Intraabdominal abscess Septic shock - resolved ileus was admitted to the hospital with intra abdominal abscess complicated with shock. This has largely resolved, is off pressors, and surgery has signed off. Currently on IV meropenem till 10/8 per primary.    Suspected VT. Had Sustained VT in ER on 12/16/23 requiring cardioversion with IV Mg and Amiodarone . After that was in afib vs flutter Stop oral amiodarone  EP saw the patient and felt like the cause of the VT was due to the illness and was likely reversible.  It was also felt that the risks of a device would outweigh the benefits with the recent infections.  Because of this EP recommended stopping his amiodarone  and monitoring his rhythm. Order live 14-day ZIO monitor  prior to discharge.     CAD s/p CABG x 5 in 04/2022 Hyperlipidemia Elevated troponin 82 > 95 Will likely need ischemic eval but will differ for now given has AKI requiring HD.  May consider outpatient nuclear stress test. Echo on 12/17/2023 showed a low normal LVEF of 50 to 55% restart home atorvastatin  80 mg daily Stop metoprolol   start COREG 12.5mg  BID.     Hypertension BP remained slightly elevated over night. Most recent BP 157/85. Heart rates in the 70's to 90's. Continue amlodipine  10 mg daily Planning to start Coreg as above for better pressure management.     Hypoxic respiratory failure -resolved extubated 10/3 SPO2 about 100% on room air.     AKI  Undergoing intermittent HD per nephrology. Planning to undergo dialysis today.     Otherwise management per primary.    For questions or updates, please contact [facility] Please consult [email or web address] for contact info under       Patient examined chart reviewed. No active cardiac symptoms To have dialysis today Exam with 2/6 SEM clear lungs Dialysis cath on left internal jugular and drain in RLQ  Prior sternotomy for CABG. No angina EF 50-55% No further PAF/VT.  EP has seen and recommended monitoring off amiodarone  and  felt arrhythmia from electrolyte abnormalities. Continue beta blocker. Will arrange outpatient monitor on d/c  Maude Emmer MD [facility]  Signed, Zane Adams, PA-C

## 2023-12-27 NOTE — Progress Notes (Signed)
 Received patient in bed to unit.  Alert and oriented.  Informed consent signed and in chart.   TX duration: 1.5  Patient tolerated well.  Transported back to the room  Alert, without acute distress.  Hand-off given to patient's nurse. Shift RN  Access used:  Dialysis catheter Access issues: Clotted, Dr. Rayburn notified  Total UF removed: 0 Medication(s) given: None Post HD VS: [id number] B/P 149/82 Post HD weight: 93.4kg  Neville Seip, RN [facility]   12/27/23 1917  Vitals  Temp 98.6 F (37 C)  Temp Source Oral  BP (!) 149/82  MAP (mmHg) 100  BP Location Left Arm  BP Method Automatic  Patient Position (if appropriate) Lying  Pulse Rate 94  Pulse Rate Source Monitor  ECG Heart Rate 94  Resp (!) 24  Weight 93.4 kg  Type of Weight Post-Dialysis  Oxygen Therapy  SpO2 97 %  O2 Device Room Air  Patient Activity (if Appropriate) In bed  Pulse Oximetry Type Continuous  During Treatment Monitoring  Blood Flow Rate (mL/min) 0 mL/min  Arterial Pressure (mmHg) 44.04 mmHg  Venous Pressure (mmHg) 523.81 mmHg  TMP (mmHg) 73.53 mmHg  Ultrafiltration Rate (mL/min) 335 mL/min  Dialysate Flow Rate (mL/min) 300 ml/min  Dialysate Potassium Concentration 2  Dialysate Calcium  Concentration 2.5  Duration of HD Treatment -hour(s) 1.56 hour(s)  Cumulative Fluid Removed (mL) per Treatment  -282.3  HD Safety Checks Performed Yes  Post Treatment  Dialyzer Clearance Clotted  Liters Processed 30.4  Fluid Removed (mL) 0 mL  Tolerated HD Treatment Yes  Post-Hemodialysis Comments Clotted access  Hemodialysis Catheter Left Internal jugular Triple lumen Temporary (Non-Tunneled)  Placement Date/Time: 12/17/23 1640   Placed prior to admission: No  Time Out: Correct patient;Correct site;Correct procedure  Maximum sterile barrier precautions: Hand hygiene;Cap;Mask;Sterile gown;Sterile gloves;Large sterile sheet;Sterile probe cove...  Site Condition No complications  Blue  Lumen Status Flushed;Antimicrobial dead end cap;Saline locked  Red Lumen Status Occluded;Antimicrobial dead end cap  Purple Lumen Status N/A  Catheter fill solution Heparin  1000 units/ml  Catheter fill volume (Arterial) 1.2 cc  Catheter fill volume (Venous) 1.2  Dressing Type Transparent  Drainage Description None  Post treatment catheter status Capped and Clamped

## 2023-12-27 NOTE — Progress Notes (Signed)
 Met with pt at bedside this morning. Introduced self and explained role. Discussed out-pt HD options. Pt prefers a clinic in the [place] area as close to his home as possible. Referral submitted to [facility] admissions this morning with request for [facility]. Pt states that friend can assist with transportation to/from HD if needed. Pt confirms plans to return home at d/c. Update provided to RN CM and nephrologist. Will assist as needed.   Randine Mungo Dialysis Navigator [phone]

## 2023-12-27 NOTE — Plan of Care (Signed)
  Problem: Education: Goal: Knowledge of the procedure and recovery process will improve Outcome: Progressing   Problem: Bowel/Gastric: Goal: Gastrointestinal status for postoperative course will improve Outcome: Progressing   Problem: Pain Management: Goal: General experience of comfort will improve Outcome: Progressing   Problem: Skin Integrity: Goal: Demonstration of wound healing without infection will improve Outcome: Progressing   Problem: Urinary Elimination: Goal: Ability to avoid or minimize complications of infection will improve Outcome: Progressing Goal: Ability to achieve and maintain urine output will improve Outcome: Progressing Goal: Home care management will improve Outcome: Progressing   Problem: Education: Goal: Knowledge of General Education information will improve Description: Including pain rating scale, medication(s)/side effects and non-pharmacologic comfort measures Outcome: Progressing   Problem: Health Behavior/Discharge Planning: Goal: Ability to manage health-related needs will improve Outcome: Progressing   Problem: Clinical Measurements: Goal: Ability to maintain clinical measurements within normal limits will improve Outcome: Progressing Goal: Will remain free from infection Outcome: Progressing Goal: Diagnostic test results will improve Outcome: Progressing Goal: Respiratory complications will improve Outcome: Progressing Goal: Cardiovascular complication will be avoided Outcome: Progressing   Problem: Activity: Goal: Risk for activity intolerance will decrease Outcome: Progressing   Problem: Nutrition: Goal: Adequate nutrition will be maintained Outcome: Progressing   Problem: Coping: Goal: Level of anxiety will decrease Outcome: Progressing   Problem: Elimination: Goal: Will not experience complications related to bowel motility Outcome: Progressing Goal: Will not experience complications related to urinary  retention Outcome: Progressing   Problem: Pain Managment: Goal: General experience of comfort will improve and/or be controlled Outcome: Progressing   Problem: Safety: Goal: Ability to remain free from injury will improve Outcome: Progressing   Problem: Skin Integrity: Goal: Risk for impaired skin integrity will decrease Outcome: Progressing   Problem: Activity: Goal: Ability to tolerate increased activity will improve Outcome: Progressing   Problem: Respiratory: Goal: Ability to maintain a clear airway and adequate ventilation will improve Outcome: Progressing   Problem: Role Relationship: Goal: Method of communication will improve Outcome: Progressing   Problem: Education: Goal: Ability to describe self-care measures that may prevent or decrease complications (Diabetes Survival Skills Education) will improve Outcome: Progressing Goal: Individualized Educational Video(s) Outcome: Progressing   Problem: Coping: Goal: Ability to adjust to condition or change in health will improve Outcome: Progressing   Problem: Fluid Volume: Goal: Ability to maintain a balanced intake and output will improve Outcome: Progressing   Problem: Health Behavior/Discharge Planning: Goal: Ability to identify and utilize available resources and services will improve Outcome: Progressing Goal: Ability to manage health-related needs will improve Outcome: Progressing   Problem: Metabolic: Goal: Ability to maintain appropriate glucose levels will improve Outcome: Progressing   Problem: Nutritional: Goal: Maintenance of adequate nutrition will improve Outcome: Progressing Goal: Progress toward achieving an optimal weight will improve Outcome: Progressing   Problem: Skin Integrity: Goal: Risk for impaired skin integrity will decrease Outcome: Progressing   Problem: Tissue Perfusion: Goal: Adequacy of tissue perfusion will improve Outcome: Progressing   Problem: Safety: Goal:  Non-violent Restraint(s) Outcome: Progressing

## 2023-12-27 NOTE — Consult Note (Addendum)
 Chief Complaint: Patient was seen in consultation today for hemodialysis requirements, with consideration for tunneled dialysis catheter placement.  Referring Provider(s): Dr. Elidia Furnace, MD   Supervising Physician: Philip Cornet  Patient Status: [facility] - In-pt  Patient is Full Code  History of Present Illness: Glenn Stewart is a 58 y.o. male  with PMHx notable for recent prostatectomy complicated by abdominal pelvic fluid collection/urinoma status post drain placement, AKI on HD status post temp HD cath placement, HTN, HLD, CAD, prediabetes, bipolar 1 disorder, ADHD, and others as delineated below.  Patient is know to IR service, having most recently undergone abscess drain injection on 10/3 by Dr. Philip.  Patient received right lower quadrant abscess drain on 9/27 by Dr. Hughes. Patient is admitted with sepsis status post radical prostatectomy complicated by intra-abdominal collection/urinoma, itself status post drain placement as above.  Patient also with AKI, requiring hemodialysis.  Patient received temp line in left IJ on 9/27 by Pulmonary critical care.    Per Dr. Effie progress note dated 10/6: AKI (acute kidney injury) ATN. Hyponatremia hyperkalemia.  09/30 Stopped CRRT  10/01 started intermittent hemodialysis.    Urine output is 1100 cc  Foley catheter in place  Today BUN  is 114 with K at 4.5 and serum bicarbonate at 20  Na 133    Plan for tunneled HD access on tomorrow.   Interventional Radiology was requested for tunneled hemodialysis catheter placement.  Patient is tentatively scheduled for same in IR tomorrow.   Patient is alert and laying in bed, calm.  He states he is doing better, and appears in good spirits. Patient is currently without any significant complaints.  Patient denies any fevers, headache, chest pain, SOB, cough, abdominal pain, nausea, vomiting or bleeding.     Past Medical History:  Diagnosis Date   ADHD (attention deficit  hyperactivity disorder)    Anxiety state, unspecified    Bipolar 1 disorder (HCC)    Cancer (HCC)    CHEST PAIN 12/04/2008   Qualifier: Diagnosis of  By: [name] MD, [name]    Coronary artery disease    Depressive disorder, not elsewhere classified    Dermatophytosis of nail    Esophageal reflux    History of kidney stones    HYPERGLYCEMIA 07/10/2007   Qualifier: Diagnosis of  By: [name] MD, [name]    Low back pain 09/18/2013   Other chronic nonalcoholic liver disease    Pre-diabetes    Problems with hearing    Prostatitis, unspecified    Pure hypercholesterolemia    Screen for STD (sexually transmitted disease) 04/20/2011   Sprain of cruciate ligament of knee    Tear of medial cartilage or meniscus of knee, current    TMJ (temporomandibular joint disorder) 12/25/2010   Unspecified essential hypertension    Unspecified gastritis and gastroduodenitis without mention of hemorrhage    Urethritis, unspecified     Past Surgical History:  Procedure Laterality Date   CORONARY ARTERY BYPASS GRAFT N/A 05/12/2022   Procedure: CORONARY ARTERY BYPASS GRAFTING (CABG) X FIVE BYPASSES USING OPEN LEFT INTERNAL MAMMARY ARTERY, OPEN LEFT RADIAL ARTERY, AND ENDOSCOPIC RIGHT GREATER SAPHENOUS VEIN HARVEST.;  Surgeon: [name], MD;  Location: [facility] OR;  Service: Open Heart Surgery;  Laterality: N/A;   CYSTOSCOPY W/ LITHOLAPAXY / EHL     IR CV LINE INJECTION  12/23/2023   LEFT HEART CATH AND CORONARY ANGIOGRAPHY N/A 05/10/2022   Procedure: LEFT HEART CATH AND CORONARY ANGIOGRAPHY;  Surgeon:  [name], MD;  Location: [facility] INVASIVE CV LAB;  Service: Cardiovascular;  Laterality: N/A;   No prior surgery     RADIAL ARTERY HARVEST Left 05/12/2022   Procedure: RADIAL ARTERY HARVEST;  Surgeon: [name], MD;  Location: [facility] OR;  Service: Open Heart Surgery;  Laterality: Left;   ROBOT ASSISTED LAPAROSCOPIC RADICAL PROSTATECTOMY N/A 11/17/2023   Procedure: PROSTATECTOMY,  RADICAL, ROBOT-ASSISTED, LAPAROSCOPIC;  Surgeon: [name], MD;  Location: [facility] ORS;  Service: Urology;  Laterality: N/A;  ROBOTIC RADICAL PROSTATECTOMY AND BILATERAL PELVIC LYMPH NODE DISSECTION   TEE WITHOUT CARDIOVERSION N/A 05/12/2022   Procedure: TRANSESOPHAGEAL ECHOCARDIOGRAM;  Surgeon: [name], MD;  Location: [facility] OR;  Service: Open Heart Surgery;  Laterality: N/A;    Allergies: Sulfonamide derivatives  Medications: Prior to Admission medications   Medication Sig Start Date End Date Taking? Authorizing Provider  acetaminophen  (TYLENOL ) 325 MG tablet Take 2 tablets (650 mg total) by mouth every 4 (four) hours as needed for moderate pain or headache. 05/19/22   Zimmerman, Donielle M, PA-C  amLODipine  (NORVASC ) 5 MG tablet Take 1 tablet (5 mg total) by mouth daily. 12/07/23   Pietro Redell RAMAN, MD  atorvastatin  (LIPITOR) 80 MG tablet Take 1 tablet (80 mg total) by mouth daily. 12/07/23   Pietro Redell RAMAN, MD  docusate sodium  (COLACE) 100 MG capsule Take 1 capsule (100 mg total) by mouth 2 (two) times daily. 11/17/23   Cory Palma, PA-C  isosorbide  mononitrate (IMDUR ) 30 MG 24 hr tablet Take 1 tablet (30 mg total) by mouth daily. 12/07/23   Pietro Redell RAMAN, MD  metoprolol  tartrate (LOPRESSOR ) 25 MG tablet TAKE ONE TABLET BY MOUTH TWICE DAILY 11/23/23   Goodrich, Callie E, PA-C  pantoprazole  (PROTONIX ) 20 MG tablet Take 1 tablet (20 mg total) by mouth daily. 12/07/23 01/06/24  Pietro Redell RAMAN, MD  sertraline  (ZOLOFT ) 100 MG tablet Take 100 mg by mouth daily.    [provider]  tamsulosin  (FLOMAX ) 0.4 MG CAPS capsule Take 0.4 mg by mouth daily after supper.    [provider]  traMADol  (ULTRAM ) 50 MG tablet Take 1-2 tablets (50-100 mg total) by mouth every 6 (six) hours as needed for moderate pain (pain score 4-6) or severe pain (pain score 7-10). 11/17/23   Cory Palma, PA-C     Family History  Problem Relation Age of Onset   Diabetes Father     Hypertension Father    Heart failure Father     Social History   Socioeconomic History   Marital status: Single    Spouse name: Not on file   Number of children: 0   Years of education: Not on file   Highest education level: Not on file  Occupational History   Occupation: Owns motorcycle shop  Tobacco Use   Smoking status: Never   Smokeless tobacco: Never  Vaping Use   Vaping status: Never Used  Substance and Sexual Activity   Alcohol use: Yes    Comment: rare   Drug use: No   Sexual activity: Not on file  Other Topics Concern   Not on file  Social History Narrative   Not on file   Social Drivers of Health   Financial Resource Strain: Not on file  Food Insecurity: No Food Insecurity (11/17/2023)   Hunger Vital Sign    Worried About Running Out of Food in the Last Year: Never true    Ran Out of Food in the Last Year: Never true  Transportation Needs: No Transportation Needs (11/17/2023)   PRAPARE - Administrator, Civil Service (Medical): No    Lack of Transportation (Non-Medical): No  Physical Activity: Not on file  Stress: Not on file  Social Connections: Not on file     Review of Systems: A 12 point ROS discussed and pertinent positives are indicated in the HPI above.  All other systems are negative.  Vital Signs: BP (!) 157/85   Pulse 79   Temp 97.8 F (36.6 C)   Resp 18   Ht 5' 5 (1.651 m)   Wt 205 lb 14.4 oz (93.4 kg)   SpO2 100%   BMI 34.26 kg/m   Advance Care Plan: The advanced care place/surrogate decision maker was discussed at the time of visit and the patient did not wish to discuss or was not able to name a surrogate decision maker or provide an advance care plan.  Physical Exam Vitals reviewed.  Constitutional:      General: He is not in acute distress.    Appearance: Normal appearance. He is obese.  HENT:     Mouth/Throat:     Mouth: Mucous membranes are moist.  Cardiovascular:     Rate and Rhythm: Normal rate and regular  rhythm.     Pulses: Normal pulses.     Heart sounds: Normal heart sounds.  Pulmonary:     Effort: Pulmonary effort is normal.     Breath sounds: Examination of the right-upper field reveals rales. Examination of the right-middle field reveals rales. Rales present.  Abdominal:     General: Abdomen is flat. There is no distension.     Palpations: Abdomen is soft.     Tenderness: There is no abdominal tenderness.     Comments: RLQ drain appropriately dressed. Dressing is clean, dry, intact. Drain incision site non-tender, without evidence of infection. Retaining suture and Stat Lock in place.  Good serous-appearing output in collection bag. Line flushes well.   Genitourinary:    Comments: Foley catheter in place. Rectal tube in place. Musculoskeletal:        General: Normal range of motion.     Cervical back: Normal range of motion.  Skin:    General: Skin is warm and dry.  Neurological:     Mental Status: He is alert and oriented to person, place, and time.  Psychiatric:        Mood and Affect: Mood normal.        Behavior: Behavior normal.        Thought Content: Thought content normal.        Judgment: Judgment normal.     Imaging: IR INJECT INDWELLING DRAINAGE CATHETER Result Date: 12/23/2023 INDICATION: Follow-up intra-abdominal abscess. Right abdominal abscess drain was placed on 12/17/2023. Recent CT demonstrates that the right abdominal abscess has decreased in size following drain placement. Evaluate for a bowel or urinary fistula. EXAM: DRAIN INJECTION WITH FLUOROSCOPY MEDICATIONS: None ANESTHESIA/SEDATION: None FLUOROSCOPY TIME:  Radiation Exposure Index (as provided by the fluoroscopic device): 42 mGy Kerma CONTRAST:  15 mL Omnipaque  300 COMPLICATIONS: None immediate. PROCEDURE: Patient was placed supine on the interventional table. Scout image was obtained. Drain was injected with contrast. Thick yellow fluid was aspirated and the drain was flushed with saline.  Fluoroscopic images were taken and saved for this procedure. FINDINGS: Pigtail drain in the right lower quadrant of the abdomen. Contrast fills an irregular collection along the lateral aspect of the right lower abdomen. Contrast is preferentially filling  a cavity cephalad to the drain. However, there is no evidence for a bowel or urinary fistula. IMPRESSION: 1. No evidence for a bowel or urinary fistula. 2. Residual cavity along the cephalad aspect of the drain. Drain was kept in place. Electronically Signed   By: Juliene Balder M.D.   On: 12/23/2023 17:42   DG CHEST PORT 1 VIEW Result Date: 12/23/2023 CLINICAL DATA:  Dyspnea. EXAM: PORTABLE CHEST 1 VIEW COMPARISON:  December 21, 2023. FINDINGS: Stable cardiomediastinal silhouette. Endotracheal and nasogastric tubes are unchanged. Bilateral internal jugular catheters are unchanged. Hypoinflation of the lungs is noted. No significant consolidative process is noted. Bony thorax is unremarkable. Status post coronary bypass graft. IMPRESSION: Stable support apparatus. Hypoinflation of the lungs. Electronically Signed   By: Lynwood Landy Raddle M.D.   On: 12/23/2023 10:23   DG Abd Portable 1V Result Date: 12/22/2023 CLINICAL DATA:  Ileus. EXAM: PORTABLE ABDOMEN - 1 VIEW COMPARISON:  Most recent radiograph 12/20/2023, CT 12/21/2023 FINDINGS: Unchanged gaseous small bowel distention in the central abdomen. There is also air-filled transverse colon. Tip and side port of the enteric tube below the diaphragm in the stomach. Right lower quadrant drain remains in place. No evidence of free air. IMPRESSION: Unchanged gaseous small bowel in transverse colonic distention in the central abdomen, favoring ileus. Electronically Signed   By: Andrea Gasman M.D.   On: 12/22/2023 11:32   DG CHEST PORT 1 VIEW Result Date: 12/21/2023 EXAM: 1 VIEW(S) XRAY OF THE CHEST 12/21/2023 05:57:00 PM COMPARISON: 12/19/2023 CLINICAL HISTORY: Dyspnea FINDINGS: LINES, TUBES AND DEVICES: Endotracheal  tube in place with tip 2.1 cm above carina. Enteric tube with tip below the diaphragm and sidehole overlying the stomach. Right internal jugular central venous catheter in place with tip over right atrium. Left internal jugular central venous catheter in place with tip over brachiocephalic vein. LUNGS AND PLEURA: Low lung volumes. Bibasilar patchy opacities. Small left pleural effusion. No pneumothorax. No pulmonary edema. HEART AND MEDIASTINUM: Median sternotomy wires and mediastinal clips noted. No acute abnormality of the cardiac and mediastinal silhouettes. BONES AND SOFT TISSUES: No acute osseous abnormality. IMPRESSION: 1. Low lung volumes and bibasilar patchy opacities. 2. Small left pleural effusion. 3. Support devices as above. Electronically signed by: Donnice Mania MD 12/21/2023 07:14 PM EDT RP Workstation: [id number]   CT ABDOMEN PELVIS W CONTRAST Result Date: 12/21/2023 CLINICAL DATA:  Intraabdominal abscess EXAM: CT ABDOMEN AND PELVIS WITH CONTRAST TECHNIQUE: Multidetector CT imaging of the abdomen and pelvis was performed using the standard protocol following bolus administration of intravenous contrast. RADIATION DOSE REDUCTION: This exam was performed according to the departmental dose-optimization program which includes automated exposure control, adjustment of the mA and/or kV according to patient size and/or use of iterative reconstruction technique. CONTRAST:  75mL OMNIPAQUE  IOHEXOL  350 MG/ML SOLN COMPARISON:  12/20/2023, 12/17/2023, 12/16/2023 FINDINGS: Lower chest: No focal airspace consolidation or pleural effusion.Posterior bibasilar dependent atelectasis. Dense multi-vessel coronary atherosclerosis. Sternotomy wires and postsurgical changes of a prior CABG. Hepatobiliary: No mass.Decompressed gallbladder containing a few small radiopaque stones. Apparent enhancing soft tissue in the gallbladder fundus, may represent normal decompressed gallbladder wall in close approximation, or focal  adenomyomatosis. No intrahepatic or extrahepatic biliary ductal dilation. The portal veins are patent. Pancreas: No mass or main ductal dilation. No peripancreatic inflammation or fluid collection. Spleen: Normal size. No mass. Adrenals/Urinary Tract: Similar multifocal nodularity in the left adrenal gland, with the largest nodule measuring 1.9 cm. There is also likely a small adrenal nodule in the medial right  adrenal gland (axial 37). No renal mass. No nephrolithiasis or hydronephrosis. The urinary bladder is completely decompressed with a low lying urinary catheter in place. Stomach/Bowel: Esophagogastric tube terminates in the stomach, well-positioned. Enteric contrast present within the stomach and proximal small bowel. While a couple of segments of small bowel in the left upper quadrant are dilated, no abrupt transition point present to suggest small bowel obstruction. Fluid present within the proximal colon.Normal appendix. Sigmoid colonic diverticulosis. No changes of acute diverticulitis. Vascular/Lymphatic: No aortic aneurysm. No intraabdominal or pelvic lymphadenopathy. Reproductive: Prostatectomy.No free pelvic fluid. Other: No pneumoperitoneum. Similar diffuse mesenteric stranding with perinephric and retroperitoneal stranding, more pronounced on the right. Interval decompression of the right retroperitoneal collection, measuring 2.8 x 7.7 cm (previously 5.6 x 10.6 cm, by my measurement), with a similarly well positioned percutaneous pigtail drainage catheter in the lower portion of the collection. Multiple small low-density fluid collections along the right lateral omentum and right paracolic gutter, measuring up to 2.4 x 4.1 cm (axial 56). A small fluid collection along the ventral right abdominal wall (axial 73), is unchanged measuring 1.9 x 7.1 cm. The irregular fluid collection in the right lower quadrant extending to the midline ventral abdominal wall, measures 6.5 x 14.4 x 13.3 cm (APxTRxCC),  axial 80. Right lower quadrant, ileocolic mesentery collection appears similar measuring 4.2 x 4.4 cm (axial 56). The fluid collections along the pelvic sidewall are improving in the interim. For example, the right sided collection measures 5.6 x 2.5 cm (previously 6.5 x 4.7 cm, axial 93). Musculoskeletal: No acute fracture or destructive lesion. Multilevel degenerative disc disease of the spine. IMPRESSION: 1. Interval decompression of the right retroperitoneal fluid collection, measuring 2.8 x 7.7 cm (previously, 5.6 x 10.6 cm, by my measurement). The pigtail percutaneous drainage catheter is otherwise similarly well-positioned in the fluid collection. 2. irregular fluid collection in the right lower quadrant extending to the midline ventral abdominal wall, measures 6.5 x 14.4 x 13.3 cm (axial 80), unchanged. 3. The bilateral pelvic sidewall fluid collections have decreased in size in the interim, measuring 5.6 x 2.5 cm on the right (previously, 6.5 x 4.7 cm). 4. Multiple small additional fluid collections in the right ileocolic mesentery, along the right paracolic gutter, and the right lower quadrant ventral abdominal wall are unchanged, as described above. 5. Similarly positioned low-lying catheter in the urinary bladder, which may be expected positioning status post prostatectomy. 6. Well-positioned esophagogastric tube terminating in the stomach. While a couple of segments of small bowel in the upper abdomen are distended, no abrupt transition point present to suggest small bowel obstruction. Aortic Atherosclerosis (ICD10-I70.0). Electronically Signed   By: Rogelia Myers M.D.   On: 12/21/2023 12:55   DG Abd Portable 1V Result Date: 12/20/2023 CLINICAL DATA:  Small-bowel obstruction. EXAM: PORTABLE ABDOMEN - 1 VIEW COMPARISON:  12/19/2023 FINDINGS: Pigtail catheter again seen in the right lower quadrant. Nasogastric tube is seen with tip in the gastric body. Moderate to markedly dilated small bowel loops  are again seen in the central and left abdomen, without significant change since previous study. No significant colonic gas or stool seen. These findings remain consistent with a small-bowel obstruction. IMPRESSION: No apparent change in small-bowel obstruction since prior study. Nasogastric tube tip in gastric body. Electronically Signed   By: Norleen DELENA Kil M.D.   On: 12/20/2023 09:15   DG Abd Portable 1V-Small Bowel Obstruction Protocol-initial, 8 hr delay Result Date: 12/19/2023 CLINICAL DATA:  8 hour delay radiograph. EXAM: PORTABLE ABDOMEN - 1  VIEW COMPARISON:  CT abdomen pelvis dated 12/16/2023. FINDINGS: Evaluation is limited due to body habitus and patient's positioning. Oral contrast noted pooling in the gastric fundus. No definite contrast noted in the small bowel or colon. Dilated air-filled loops of small bowel measure up to 5.5 cm in caliber. Continued follow-up recommended. A pigtail drainage catheter noted in the right lower quadrant. IMPRESSION: 1. Oral contrast pooling in the gastric fundus. No definite contrast noted in the small bowel or colon. 2. Dilated air-filled loops of small bowel. Electronically Signed   By: Vanetta Chou M.D.   On: 12/19/2023 20:09   DG CHEST PORT 1 VIEW Result Date: 12/19/2023 CLINICAL DATA:  Respiratory failure. EXAM: PORTABLE CHEST 1 VIEW COMPARISON:  12/17/2023. FINDINGS: Endotracheal tube tip terminates approximately 4 cm above the carina. Similarly position right IJ CVC catheter tip in the right atrium and the left CVC catheter tip overlying the brachiocephalic vein. No pneumothorax. Stable cardiomediastinal contours status post median sternotomy and CABG. Similar small left pleural effusion. IMPRESSION: 1. Similarly position life-support apparatus, as above. 2. Small left pleural effusion, not significantly changed. Electronically Signed   By: Harrietta Sherry M.D.   On: 12/19/2023 08:21   ECHOCARDIOGRAM COMPLETE Result Date: 12/18/2023    ECHOCARDIOGRAM  REPORT   Patient Name:   GAR GLANCE Date of Exam: 12/17/2023 Medical Rec #:  [id number]        Height:       65.0 in Accession #:    [id number]       Weight:       228.8 lb Date of Birth:  1965/08/04         BSA:          2.095 m Patient Age:    58 years         BP:           99/69 mmHg Patient Gender: M                HR:           95 bpm. Exam Location:  Inpatient Procedure: 2D Echo, Cardiac Doppler and Color Doppler (Both Spectral and Color            Flow Doppler were utilized during procedure). Indications:    Shock R57.9  History:        Patient has prior history of Echocardiogram examinations, most                 recent 05/10/2022. CAD and Angina, Prior CABG,                 Signs/Symptoms:Chest Pain; Risk Factors:Hypertension and                 Dyslipidemia.  Sonographer:    Thea Norlander RCS Referring Phys: VINA NOVAK SIMPSON IMPRESSIONS  1. Left ventricular ejection fraction, by estimation, is 50 to 55%. The left ventricle has low normal function. The left ventricle has no regional wall motion abnormalities. Left ventricular diastolic parameters were normal.  2. Right ventricular systolic function is normal. The right ventricular size is mildly enlarged. Tricuspid regurgitation signal is inadequate for assessing PA pressure.  3. The mitral valve is normal in structure. No evidence of mitral valve regurgitation. No evidence of mitral stenosis.  4. The aortic valve is normal in structure. Aortic valve regurgitation is not visualized. No aortic stenosis is present.  5. The inferior vena cava is normal in size with greater than 50% respiratory  variability, suggesting right atrial pressure of 3 mmHg. FINDINGS  Left Ventricle: Left ventricular ejection fraction, by estimation, is 50 to 55%. The left ventricle has low normal function. The left ventricle has no regional wall motion abnormalities. The left ventricular internal cavity size was normal in size. There is no left ventricular hypertrophy. Left  ventricular diastolic parameters were normal. Right Ventricle: The right ventricular size is mildly enlarged. No increase in right ventricular wall thickness. Right ventricular systolic function is normal. Tricuspid regurgitation signal is inadequate for assessing PA pressure. Left Atrium: Left atrial size was normal in size. Right Atrium: Right atrial size was normal in size. Pericardium: There is no evidence of pericardial effusion. Mitral Valve: The mitral valve is normal in structure. No evidence of mitral valve regurgitation. No evidence of mitral valve stenosis. Tricuspid Valve: The tricuspid valve is normal in structure. Tricuspid valve regurgitation is trivial. No evidence of tricuspid stenosis. Aortic Valve: The aortic valve is normal in structure. Aortic valve regurgitation is not visualized. No aortic stenosis is present. Aortic valve peak gradient measures 7.8 mmHg. Pulmonic Valve: The pulmonic valve was normal in structure. Pulmonic valve regurgitation is not visualized. No evidence of pulmonic stenosis. Aorta: The aortic root is normal in size and structure. Venous: The inferior vena cava is normal in size with greater than 50% respiratory variability, suggesting right atrial pressure of 3 mmHg. IAS/Shunts: No atrial level shunt detected by color flow Doppler.  LEFT VENTRICLE PLAX 2D LVIDd:         4.40 cm   Diastology LVIDs:         3.20 cm   LV e' medial:    8.92 cm/s LV PW:         1.10 cm   LV E/e' medial:  10.5 LV IVS:        1.10 cm   LV e' lateral:   20.40 cm/s LVOT diam:     2.40 cm   LV E/e' lateral: 4.6 LV SV:         73 LV SV Index:   35 LVOT Area:     4.52 cm  RIGHT VENTRICLE             IVC RV S prime:     10.70 cm/s  IVC diam: 2.00 cm TAPSE (M-mode): 1.7 cm LEFT ATRIUM             Index        RIGHT ATRIUM           Index LA diam:        4.00 cm 1.91 cm/m   RA Area:     14.80 cm LA Vol (A2C):   33.1 ml 15.80 ml/m  RA Volume:   29.80 ml  14.23 ml/m LA Vol (A4C):   28.4 ml 13.56 ml/m  LA Biplane Vol: 31.2 ml 14.89 ml/m  AORTIC VALVE AV Area (Vmax): 3.36 cm AV Vmax:        140.00 cm/s AV Peak Grad:   7.8 mmHg LVOT Vmax:      104.00 cm/s LVOT Vmean:     70.200 cm/s LVOT VTI:       0.161 m  AORTA Ao Root diam: 3.50 cm Ao Asc diam:  3.40 cm MITRAL VALVE MV Area (PHT): 4.21 cm    SHUNTS MV Decel Time: 180 msec    Systemic VTI:  0.16 m MV E velocity: 93.30 cm/s  Systemic Diam: 2.40 cm MV A velocity: 72.80 cm/s MV E/A  ratio:  1.28 Oneil Parchment MD Electronically signed by Oneil Parchment MD Signature Date/Time: 12/18/2023/7:04:53 AM    Final    DG CHEST PORT 1 VIEW Result Date: 12/17/2023 CLINICAL DATA:  Central line placement EXAM: PORTABLE CHEST 1 VIEW COMPARISON:  12/17/2023 FINDINGS: Introduction of a LEFT central venous line with tip in the brachycephalic vein. No pneumothorax Endotracheal tube, NG tube, and RIGHT central venous line are unchanged. Midline sternotomy.  Low lung volumes.  LEFT effusion. IMPRESSION: 1. LEFT central venous line with tip in the brachiocephalic vein. No pneumothorax. 2. Additional support apparatus unchanged. 3. Low lung volumes and LEFT effusion. Electronically Signed   By: Jackquline Boxer M.D.   On: 12/17/2023 16:55   CT GUIDED PERITONEAL/RETROPERITONEAL FLUID DRAIN BY PERC CATH Result Date: 12/17/2023 INDICATION: [id number] Abdominal fluid collection [phone]. Concern for bladder perforation EXAM: CT-GUIDED RIGHT LOWER QUADRANT ABSCESS DRAINAGE CATHETER PLACEMENT COMPARISON:  CT cystogram and CT AP, 12/16/2023. MEDICATIONS: The patient is currently admitted to the hospital and receiving intravenous antibiotics. The antibiotics were administered within an appropriate time frame prior to the initiation of the procedure. ANESTHESIA/SEDATION: Sedation by the ICU RN team was performed. Please see RN log for details. CONTRAST:  None FLUOROSCOPY TIME:  CT dose; 1011 mGycm COMPLICATIONS: None immediate. PROCEDURE: RADIATION DOSE REDUCTION: This exam was performed according to  the departmental dose-optimization program which includes automated exposure control, adjustment of the mA and/or kV according to patient size and/or use of iterative reconstruction technique. Informed written consent was obtained from the patient and/or patient's representative after a discussion of the risks, benefits and alternatives to treatment. The patient was placed supine on the CT gantry and a pre procedural CT was performed re-demonstrating the known abscess/fluid collection within the RIGHT lower quad. The procedure was planned. A timeout was performed prior to the initiation of the procedure. The RIGHT lower quad was prepped and draped in the usual sterile fashion. The overlying soft tissues were anesthetized with 1% lidocaine  with epinephrine . Appropriate trajectory was planned with the use of a 22 gauge spinal needle. An 18 gauge trocar needle was advanced into the abscess/fluid collection and a short Amplatz super stiff wire was coiled within the collection. Appropriate positioning was confirmed with a limited CT scan. The tract was serially dilated allowing placement of a 14 Fr Fr drainage catheter. Appropriate positioning was confirmed with a limited postprocedural CT scan. 30 mL of turbid fluid was aspirated. The tube was connected to a drainage bag and sutured in place. A dressing was placed. The patient tolerated the procedure well without immediate post procedural complication. IMPRESSION: Successful CT-guided placement of a 14 Fr drainage catheter into the RIGHT lower quadrant with aspiration of 30 mL mL of turbid fluid. Samples were sent to the laboratory as requested by the ordering clinical team. RECOMMENDATIONS: The patient will return to [facility] ([facility]) for routine drainage catheter evaluation and exchange in 7-10 days. Thom Hall, MD [facility] Electronically Signed   By: Thom Hall M.D.   On:  12/17/2023 14:04   DG Chest Port 1 View Result Date: 12/17/2023 EXAM: 1 VIEW(S) XRAY OF THE CHEST 12/17/2023 05:48:00 AM COMPARISON: 12/16/2023 CLINICAL HISTORY: Respiratory failure; Hx of: chest pain, coronary artery disease; Chief complaint: Loss of consciousness FINDINGS: LINES, TUBES AND DEVICES: Right IJ central venous catheter in place with tip over the SVC. ETT in place with tip 3.7 cm above the carina. Enteric tube in place, courses below the diaphragm with  tip obscured. Overlying defibrillator pads. LUNGS AND PLEURA: Low lung volumes. Mild right basilar atelectasis. Similar left retrocardiac airspace opacity, likely atelectasis. No pulmonary edema. No pleural effusion. No pneumothorax. HEART AND MEDIASTINUM: No acute abnormality of the cardiac and mediastinal silhouettes. BONES AND SOFT TISSUES: Median sternotomy and CABG changes noted. IMPRESSION: 1. Low lung volumes with mild basilar atelectasis/retrocardiac atelectasis. 2. Support devices appropriately positioned, including right IJ central venous catheter with tip in the SVC, endotracheal tube 3.7 cm above the carina, and enteric tube coursing below the diaphragm. Electronically signed by: Waddell Calk MD 12/17/2023 06:09 AM EDT RP Workstation: [id number]   CT CYSTOGRAM ABD/PELVIS Result Date: 12/16/2023 CLINICAL DATA:  Sepsis with concern for bladder perforation. EXAM: CT CYSTOGRAM (CT ABDOMEN AND PELVIS WITH CONTRAST) TECHNIQUE: Multi-detector CT imaging through the abdomen and pelvis was performed after dilute contrast had been introduced into the bladder for the purposes of performing CT cystography. RADIATION DOSE REDUCTION: This exam was performed according to the departmental dose-optimization program which includes automated exposure control, adjustment of the mA and/or kV according to patient size and/or use of iterative reconstruction technique. CONTRAST:  50mL OMNIPAQUE  IOHEXOL  300 MG/ML  SOLN COMPARISON:  12/16/2023 FINDINGS: Lower  chest: Atelectasis or consolidation in both lung bases. Hepatobiliary: Cholelithiasis.  No bile duct dilatation. Pancreas: Unremarkable. No pancreatic ductal dilatation or surrounding inflammatory changes. Spleen: Normal in size without focal abnormality. Adrenals/Urinary Tract: Left adrenal gland nodules. See previous description from prior CT earlier today. Kidneys are symmetrical. No hydronephrosis or hydroureter. Contrast material fills the bladder. Foley catheter in the bladder. No bladder wall thickening. Contrast material is demonstrated leaking along the catheter through the urethra to the distal penile region. There is focal irregularity at the junction of the urethra with the bladder base with small anterior contrast pooling suggesting contained leak. No contrast extravasation to suggest intraperitoneal leakage. Remainder the urethra appears otherwise intact. Stomach/Bowel: Enteric tube tip in the stomach. Dilated fluid-filled small bowel demonstrates some decompression since previous study. Colon is decompressed. Persistent wall thickening in the terminal ileum and colon. Vascular/Lymphatic: Aortic atherosclerosis. No enlarged abdominal or pelvic lymph nodes. Reproductive: Prostate gland is surgically absent. No significant pelvic lymphadenopathy. Other: Multiple loculated fluid collections demonstrated in the right abdomen and pelvis as previously discussed. Small amount of free fluid. Diffuse stranding throughout the intra-abdominal fat. Changes are consistent with multiple abscesses. Musculoskeletal: No acute bony abnormalities. No focal bone lesions. IMPRESSION: 1. CT cystogram with Foley catheter in place. Contrast material leaks along the catheter through the urethra. There is focal irregularity at the junction of the bladder base and urethra with small anterior contrast pooling suggesting contained perforation. No intraperitoneal contrast extravasation. 2. Appearance examination is otherwise  unchanged. Again demonstrated are multiple loculated fluid collections in the abdomen and pelvis consistent with abscesses. Wall thickening of the cecum and terminal ileum. Fluid-filled dilated small bowel appear mildly improved. Electronically Signed   By: Elsie Gravely M.D.   On: 12/16/2023 22:41   CT Angio Chest Pulmonary Embolism (PE) W or WO Contrast Result Date: 12/16/2023 CLINICAL DATA:  Aortic aneurysm suspected. Severe right-sided abdominal pain and abdominal distention. Decreased oxygen saturation. EXAM: CT ANGIOGRAPHY CHEST CT ABDOMEN AND PELVIS WITH CONTRAST TECHNIQUE: Multidetector CT imaging of the chest was performed using the standard protocol during bolus administration of intravenous contrast. Multiplanar CT image reconstructions and MIPs were obtained to evaluate the vascular anatomy. Multidetector CT imaging of the abdomen and pelvis was performed using the standard protocol during bolus administration of  intravenous contrast. RADIATION DOSE REDUCTION: This exam was performed according to the departmental dose-optimization program which includes automated exposure control, adjustment of the mA and/or kV according to patient size and/or use of iterative reconstruction technique. CONTRAST:  75mL OMNIPAQUE  IOHEXOL  350 MG/ML SOLN COMPARISON:  Chest radiograph 12/16/2023.  PET-CT 06/23/2023 FINDINGS: CTA CHEST FINDINGS Cardiovascular: Cardiac enlargement. No pericardial effusions. Normal caliber thoracic aorta. No aortic dissection. Calcification of the aorta and coronary arteries. Great vessel origins are patent. Postoperative changes consistent with coronary bypass. No evidence of significant pulmonary embolus. Mediastinum/Nodes: Enteric tube with tip in the stomach. Esophagus is decompressed. Endotracheal tube with tip above the carina. Thyroid gland is unremarkable. Scattered mediastinal lymph nodes are not pathologically enlarged, likely reactive. Lungs/Pleura: Consolidation or atelectasis  in both lung bases, possibly pneumonia, compressive atelectasis, or aspiration. Small bilateral pleural effusions. No pneumothorax. Musculoskeletal: Degenerative changes in the spine. Sternotomy wires. No acute bony abnormalities. Review of the MIP images confirms the above findings. CT ABDOMEN and PELVIS FINDINGS Hepatobiliary: No focal liver lesions. Cholelithiasis with several stones in the gallbladder. No wall thickening or inflammatory stranding. No bile duct dilatation. Pancreas: Unremarkable. No pancreatic ductal dilatation or surrounding inflammatory changes. Spleen: Normal in size without focal abnormality. Adrenals/Urinary Tract: Left adrenal gland nodule measuring 2.1 cm diameter. Sixty-five Hounsfield unit density measurement. The lesions are unchanged since prior unenhanced study from 12/18/2020 at which time the density measurement was -3 Hounsfield units. This is consistent with a benign fat containing adenoma and no imaging follow-up is indicated. Kidneys are symmetrical and homogeneous. No hydronephrosis or hydroureter. Bladder is decompressed. Stomach/Bowel: Stomach is unremarkable. Colon is decompressed. Wall thickening of the ascending colon and hepatic flexure, new since prior study. This may indicate colitis or inflammatory bowel disease versus reactive change. Dilated fluid-filled small bowel with air-fluid levels and mild diffuse wall thickening. Terminal ileum is decompressed. This is consistent with small-bowel obstruction, possibly due to inflammatory strictures. Vascular/Lymphatic: Aortic atherosclerosis. No enlarged abdominal or pelvic lymph nodes. Reproductive: Prostate is unremarkable. Other: Diffuse infiltration throughout the right-sided abdominal and pelvic mesenteric fat. Multiple loculated fluid collections are demonstrated throughout the right abdomen and pelvis. A right pelvic collection measures 7.4 cm diameter. A right anterior lower quadrant collection measures 8.8 cm  diameter. A right anterior para psoas collection measures 9.4 cm diameter. Multiple smaller right upper quadrant collections are demonstrated. Some collections contains small gas bubbles. This appearance is likely indicate multiple abscesses, possibly due to perforation. Could also consider a perforated appendicitis. Small amount of free fluid in the abdomen, likely ascites or reactive fluid. No free intra-abdominal gas. Musculoskeletal: Degenerative changes in the spine. No acute bony abnormalities. Review of the MIP images confirms the above findings. IMPRESSION: 1. Fluid-filled distended small bowel with decompressed terminal ileum consistent with small-bowel obstruction. 2. Irregular wall thickening diffusely throughout the distal small bowel and right colon suggesting enterocolitis or inflammatory bowel disease. Inflammatory strictures may be present. 3. Multiple loculated fluid collections, some with gas, demonstrated in the right abdomen and pelvis indicating abscesses. This is likely due to perforation, possibly related to inflammatory bowel disease or to an occult perforated appendicitis. The appendix is self is not demonstrated. No free intra-abdominal air. 4. Small amount of free fluid in the abdomen is likely reactive. 5. Cholelithiasis without evidence of acute cholecystitis. 6. Aortic atherosclerosis. 7. Consolidation versus compressive atelectasis in both lung bases. Small bilateral pleural effusions. Critical Value/emergent results were called by telephone at the time of interpretation on 12/16/2023 at 5:28 pm  to provider MATTHEW TRIFAN , who verbally acknowledged these results. Electronically Signed   By: Elsie Gravely M.D.   On: 12/16/2023 17:33   CT ABDOMEN PELVIS W CONTRAST Result Date: 12/16/2023 CLINICAL DATA:  Aortic aneurysm suspected. Severe right-sided abdominal pain and abdominal distention. Decreased oxygen saturation. EXAM: CT ANGIOGRAPHY CHEST CT ABDOMEN AND PELVIS WITH CONTRAST  TECHNIQUE: Multidetector CT imaging of the chest was performed using the standard protocol during bolus administration of intravenous contrast. Multiplanar CT image reconstructions and MIPs were obtained to evaluate the vascular anatomy. Multidetector CT imaging of the abdomen and pelvis was performed using the standard protocol during bolus administration of intravenous contrast. RADIATION DOSE REDUCTION: This exam was performed according to the departmental dose-optimization program which includes automated exposure control, adjustment of the mA and/or kV according to patient size and/or use of iterative reconstruction technique. CONTRAST:  75mL OMNIPAQUE  IOHEXOL  350 MG/ML SOLN COMPARISON:  Chest radiograph 12/16/2023.  PET-CT 06/23/2023 FINDINGS: CTA CHEST FINDINGS Cardiovascular: Cardiac enlargement. No pericardial effusions. Normal caliber thoracic aorta. No aortic dissection. Calcification of the aorta and coronary arteries. Great vessel origins are patent. Postoperative changes consistent with coronary bypass. No evidence of significant pulmonary embolus. Mediastinum/Nodes: Enteric tube with tip in the stomach. Esophagus is decompressed. Endotracheal tube with tip above the carina. Thyroid gland is unremarkable. Scattered mediastinal lymph nodes are not pathologically enlarged, likely reactive. Lungs/Pleura: Consolidation or atelectasis in both lung bases, possibly pneumonia, compressive atelectasis, or aspiration. Small bilateral pleural effusions. No pneumothorax. Musculoskeletal: Degenerative changes in the spine. Sternotomy wires. No acute bony abnormalities. Review of the MIP images confirms the above findings. CT ABDOMEN and PELVIS FINDINGS Hepatobiliary: No focal liver lesions. Cholelithiasis with several stones in the gallbladder. No wall thickening or inflammatory stranding. No bile duct dilatation. Pancreas: Unremarkable. No pancreatic ductal dilatation or surrounding inflammatory changes. Spleen:  Normal in size without focal abnormality. Adrenals/Urinary Tract: Left adrenal gland nodule measuring 2.1 cm diameter. Sixty-five Hounsfield unit density measurement. The lesions are unchanged since prior unenhanced study from 12/18/2020 at which time the density measurement was -3 Hounsfield units. This is consistent with a benign fat containing adenoma and no imaging follow-up is indicated. Kidneys are symmetrical and homogeneous. No hydronephrosis or hydroureter. Bladder is decompressed. Stomach/Bowel: Stomach is unremarkable. Colon is decompressed. Wall thickening of the ascending colon and hepatic flexure, new since prior study. This may indicate colitis or inflammatory bowel disease versus reactive change. Dilated fluid-filled small bowel with air-fluid levels and mild diffuse wall thickening. Terminal ileum is decompressed. This is consistent with small-bowel obstruction, possibly due to inflammatory strictures. Vascular/Lymphatic: Aortic atherosclerosis. No enlarged abdominal or pelvic lymph nodes. Reproductive: Prostate is unremarkable. Other: Diffuse infiltration throughout the right-sided abdominal and pelvic mesenteric fat. Multiple loculated fluid collections are demonstrated throughout the right abdomen and pelvis. A right pelvic collection measures 7.4 cm diameter. A right anterior lower quadrant collection measures 8.8 cm diameter. A right anterior para psoas collection measures 9.4 cm diameter. Multiple smaller right upper quadrant collections are demonstrated. Some collections contains small gas bubbles. This appearance is likely indicate multiple abscesses, possibly due to perforation. Could also consider a perforated appendicitis. Small amount of free fluid in the abdomen, likely ascites or reactive fluid. No free intra-abdominal gas. Musculoskeletal: Degenerative changes in the spine. No acute bony abnormalities. Review of the MIP images confirms the above findings. IMPRESSION: 1. Fluid-filled  distended small bowel with decompressed terminal ileum consistent with small-bowel obstruction. 2. Irregular wall thickening diffusely throughout the distal small bowel and  right colon suggesting enterocolitis or inflammatory bowel disease. Inflammatory strictures may be present. 3. Multiple loculated fluid collections, some with gas, demonstrated in the right abdomen and pelvis indicating abscesses. This is likely due to perforation, possibly related to inflammatory bowel disease or to an occult perforated appendicitis. The appendix is self is not demonstrated. No free intra-abdominal air. 4. Small amount of free fluid in the abdomen is likely reactive. 5. Cholelithiasis without evidence of acute cholecystitis. 6. Aortic atherosclerosis. 7. Consolidation versus compressive atelectasis in both lung bases. Small bilateral pleural effusions. Critical Value/emergent results were called by telephone at the time of interpretation on 12/16/2023 at 5:28 pm to provider MATTHEW TRIFAN , who verbally acknowledged these results. Electronically Signed   By: Elsie Gravely M.D.   On: 12/16/2023 17:33   CT HEAD WO CONTRAST Result Date: 12/16/2023 CLINICAL DATA:  Altered mental status. EXAM: CT HEAD WITHOUT CONTRAST TECHNIQUE: Contiguous axial images were obtained from the base of the skull through the vertex without intravenous contrast. RADIATION DOSE REDUCTION: This exam was performed according to the departmental dose-optimization program which includes automated exposure control, adjustment of the mA and/or kV according to patient size and/or use of iterative reconstruction technique. COMPARISON:  May 10, 2022 FINDINGS: Brain: No evidence of acute infarction, hemorrhage, hydrocephalus, extra-axial collection or mass lesion/mass effect. There are areas of mildly decreased attenuation within the white matter tracts of the supratentorial brain, consistent with microvascular disease changes. Vascular: No hyperdense  vessel or unexpected calcification. Skull: Normal. Negative for fracture or focal lesion. Sinuses/Orbits: No acute finding. Other: None. IMPRESSION: No acute intracranial pathology. Electronically Signed   By: Suzen Dials M.D.   On: 12/16/2023 17:17   DG Chest Port 1 View Result Date: 12/16/2023 CLINICAL DATA:  [id number] Encounter for central line placement [id number] EXAM: PORTABLE CHEST - 1 VIEW COMPARISON:  12/16/2023, 07/16/2023 FINDINGS: Similarly positioned endotracheal and esophagogastric tubes. Interval placement of a right IJ approach central venous catheter, which terminates at the cavoatrial junction. Improved aeration of the lungs. Retrocardiac airspace opacities. No pleural effusion or pneumothorax. No cardiomegaly. Sternotomy wires and CABG markers. Defibrillator pads. IMPRESSION: 1. Interval placement of a right IJ approach central venous catheter, which terminates at the cavoatrial junction. No pneumothorax. 2. Improved aeration of the lungs with retrocardiac airspace opacities, possibly atelectasis or aspiration. Electronically Signed   By: Rogelia Myers M.D.   On: 12/16/2023 16:28   DG Chest Port 1 View Result Date: 12/16/2023 CLINICAL DATA:  Central line and ET tube placement EXAM: PORTABLE CHEST - 1 VIEW COMPARISON:  July 16, 2023 FINDINGS: Endotracheal tube terminates in the mid trachea. Esophagogastric tube terminates in the region of the stomach. Overlying defibrillator pads. Markedly low lung volumes. No focal airspace consolidation, pleural effusion, or pneumothorax. No cardiomegaly. IMPRESSION: 1. Well-positioned endotracheal and esophagogastric tubes. No pneumothorax. 2. Markedly low lung volumes.  No pneumonia or pleural effusion. Electronically Signed   By: Rogelia Myers M.D.   On: 12/16/2023 16:26    Labs:  CBC: Recent Labs    12/20/23 0414 12/21/23 0432 12/22/23 0359 12/23/23 0638 12/23/23 1129  WBC 13.8* 16.9* 13.7* 11.0*  --   HGB 10.6* 10.5* 10.6* 11.3* 10.5*   HCT 31.4* 32.0* 31.8* 35.1* 31.0*  PLT 281 310 338 359  --     COAGS: Recent Labs    12/16/23 1524  INR 1.2  APTT 29    BMP: Recent Labs    12/24/23 0512 12/25/23 0630 12/26/23 0316 12/27/23 0813  NA  134* 135 133* 130*  K 4.1 4.1 4.5 4.7  CL 98 95* 95* 95*  CO2 19* 23 20* 17*  GLUCOSE 202* 113* 99 122*  BUN 113* 89* 114* 143*  CALCIUM  7.5* 7.8* 8.0* 7.8*  CREATININE 5.92* 5.56* 6.71* 7.44*  GFRNONAA 10* 11* 9* 8*    LIVER FUNCTION TESTS: Recent Labs    12/22/23 0359 12/22/23 1527 12/24/23 0512 12/25/23 0630 12/26/23 0316 12/27/23 0813  BILITOT 1.0  --  0.7 0.9 1.1  --   AST 36  --  32 30 39  --   ALT 25  --  20 20 27   --   ALKPHOS 97  --  90 82 92  --   PROT 6.0*  --  6.0* 5.9* 6.6  --   ALBUMIN  <1.5*  <1.5*   < > 1.6* 1.6* 1.9* 2.0*   < > = values in this interval not displayed.    TUMOR MARKERS: No results for input(s): AFPTM, CEA, CA199, CHROMGRNA in the last 8760 hours.    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  Patient also with AKI, requiring hemodialysis.  Patient received temp line in left IJ on 9/27 by pulmonary critical care.    1) AKI on hemodialysis: Care team is requesting for tunneled HD access line.  Patient will tentatively presented tomorrow for new line placement.  Patient will be n.p.o. at midnight. All labs and medications are within acceptable parameters.  Allergies reviewed: Sulfonamide derivatives.  Risks and benefits discussed with the patient including, but not limited to bleeding, infection, vascular injury, pneumothorax which may require chest tube placement, air embolism or even death  All of the patient's questions were answered, patient is agreeable to proceed. Consent signed and in chart.   2) Abdominal abscess drain:  Drain Location: RLQ Size: Fr size: 14 Fr Date of placement:  9/27  Currently to: Drain collection device: gravity 24 hour output:  Output by Drain (mL) 12/25/23 0701 - 12/25/23 1900 12/25/23 1901 - 12/26/23 0700 12/26/23 0701 - 12/26/23 1900 12/26/23 1901 - 12/27/23 0700 12/27/23 0701 - 12/27/23 1057  Closed System Drain 1 Right Abdomen Other (Comment) 14 Fr.  30 155  50    Interval imaging/drain manipulation:  During evaluation on 10/3 without evidence of fistula to bowel or bladder.  Current examination: Flushes/aspirates easily.  Insertion site unremarkable. Suture and stat lock in place. Dressed appropriately.   Plan: Continue TID flushes with 5 cc NS. Record output Q shift. Dressing changes QD or PRN if soiled.  Call IR APP or on call IR MD if difficulty flushing or sudden change in drain output.  Repeat imaging/possible drain injection once output < 10 mL/QD (excluding flush material). Consideration for drain removal if output is < 10 mL/QD (excluding flush material), pending discussion with the providing surgical service.  Discharge planning: Please contact IR APP or on call IR MD prior to patient d/c to ensure appropriate follow up plans are in place. Typically patient will follow up with IR clinic 10-14 days post d/c for repeat imaging/possible drain injection. IR scheduler will contact patient with date/time of appointment. Patient will need to flush drain QD with 5 cc NS, record output QD, dressing changes every 2-3 days or earlier if soiled.   IR will continue to follow - please call with questions or concerns.   Thank you for allowing our service to participate in HAYES CZAJA '  s care.  Electronically Signed: Carlin DELENA Griffon, PA-C   12/27/2023, 10:21 AM      I spent a total of 40 Minutes in face to face in clinical consultation, greater than 50% of which was counseling/coordinating care for hemodialysis requirements, with consideration for tunneled dialysis catheter placement.

## 2023-12-28 ENCOUNTER — Inpatient Hospital Stay (HOSPITAL_COMMUNITY)

## 2023-12-28 DIAGNOSIS — I2510 Atherosclerotic heart disease of native coronary artery without angina pectoris: Secondary | ICD-10-CM | POA: Diagnosis not present

## 2023-12-28 DIAGNOSIS — Z992 Dependence on renal dialysis: Secondary | ICD-10-CM

## 2023-12-28 DIAGNOSIS — B9629 Other Escherichia coli [E. coli] as the cause of diseases classified elsewhere: Secondary | ICD-10-CM | POA: Diagnosis not present

## 2023-12-28 DIAGNOSIS — K651 Peritoneal abscess: Secondary | ICD-10-CM | POA: Diagnosis not present

## 2023-12-28 DIAGNOSIS — E66812 Obesity, class 2: Secondary | ICD-10-CM | POA: Diagnosis not present

## 2023-12-28 DIAGNOSIS — N390 Urinary tract infection, site not specified: Secondary | ICD-10-CM

## 2023-12-28 DIAGNOSIS — L89300 Pressure ulcer of unspecified buttock, unstageable: Secondary | ICD-10-CM | POA: Insufficient documentation

## 2023-12-28 DIAGNOSIS — N179 Acute kidney failure, unspecified: Secondary | ICD-10-CM | POA: Diagnosis not present

## 2023-12-28 DIAGNOSIS — B9561 Methicillin susceptible Staphylococcus aureus infection as the cause of diseases classified elsewhere: Secondary | ICD-10-CM

## 2023-12-28 HISTORY — PX: IR TUNNELED CENTRAL VENOUS CATH PLC W IMG: IMG1939

## 2023-12-28 LAB — BASIC METABOLIC PANEL WITH GFR
Anion gap: 20 — ABNORMAL HIGH (ref 5–15)
BUN: 125 mg/dL — ABNORMAL HIGH (ref 6–20)
CO2: 18 mmol/L — ABNORMAL LOW (ref 22–32)
Calcium: 7.4 mg/dL — ABNORMAL LOW (ref 8.9–10.3)
Chloride: 92 mmol/L — ABNORMAL LOW (ref 98–111)
Creatinine, Ser: 6.26 mg/dL — ABNORMAL HIGH (ref 0.61–1.24)
GFR, Estimated: 10 mL/min — ABNORMAL LOW (ref >60)
Glucose, Bld: 126 mg/dL — ABNORMAL HIGH (ref 70–99)
Potassium: 5.1 mmol/L (ref 3.5–5.1)
Sodium: 130 mmol/L — ABNORMAL LOW (ref 135–145)

## 2023-12-28 LAB — CBC
HCT: 33.3 % — ABNORMAL LOW (ref 39.0–52.0)
Hemoglobin: 10.8 g/dL — ABNORMAL LOW (ref 13.0–17.0)
MCH: 26.7 pg (ref 26.0–34.0)
MCHC: 32.4 g/dL (ref 30.0–36.0)
MCV: 82.4 fL (ref 80.0–100.0)
Platelets: 411 K/uL — ABNORMAL HIGH (ref 150–400)
RBC: 4.04 MIL/uL — ABNORMAL LOW (ref 4.22–5.81)
RDW: 15.1 % (ref 11.5–15.5)
WBC: 11 K/uL — ABNORMAL HIGH (ref 4.0–10.5)
nRBC: 0 % (ref 0.0–0.2)

## 2023-12-28 LAB — GLUCOSE, CAPILLARY
Glucose-Capillary: 178 mg/dL — ABNORMAL HIGH (ref 70–99)
Glucose-Capillary: 126 mg/dL — ABNORMAL HIGH (ref 70–99)
Glucose-Capillary: 124 mg/dL — ABNORMAL HIGH (ref 70–99)
Glucose-Capillary: 137 mg/dL — ABNORMAL HIGH (ref 70–99)
Glucose-Capillary: 100 mg/dL — ABNORMAL HIGH (ref 70–99)

## 2023-12-28 MED ORDER — MIDAZOLAM HCL 2 MG/2ML IJ SOLN
INTRAMUSCULAR | Status: AC | PRN
  Administered 2023-12-28: 1 mg via INTRAVENOUS

## 2023-12-28 MED ORDER — FENTANYL CITRATE (PF) 100 MCG/2ML IJ SOLN
INTRAMUSCULAR | Status: AC
  Filled 2023-12-28: qty 2

## 2023-12-28 MED ORDER — CEFAZOLIN SODIUM-DEXTROSE 2-4 GM/100ML-% IV SOLN
INTRAVENOUS | Status: AC | PRN
  Administered 2023-12-28: 2 g via INTRAVENOUS

## 2023-12-28 MED ORDER — MIDAZOLAM HCL 2 MG/2ML IJ SOLN
INTRAMUSCULAR | Status: AC
  Filled 2023-12-28: qty 2

## 2023-12-28 MED ORDER — CEFAZOLIN SODIUM-DEXTROSE 1-4 GM/50ML-% IV SOLN
1.0000 g | Freq: Two times a day (BID) | INTRAVENOUS | Status: DC
  Administered 2023-12-29 – 2024-01-02 (×8): 1 g via INTRAVENOUS
  Filled 2023-12-28 (×12): qty 50

## 2023-12-28 MED ORDER — METRONIDAZOLE 500 MG PO TABS
500.0000 mg | ORAL_TABLET | Freq: Two times a day (BID) | ORAL | Status: DC
Start: 2023-12-28 — End: 2024-01-02
  Administered 2023-12-28 – 2024-01-02 (×10): 500 mg via ORAL
  Filled 2023-12-28 (×10): qty 1

## 2023-12-28 MED ORDER — CEFAZOLIN SODIUM-DEXTROSE 2-4 GM/100ML-% IV SOLN
INTRAVENOUS | Status: AC
  Filled 2023-12-28: qty 100

## 2023-12-28 MED ORDER — HEPARIN SODIUM (PORCINE) 1000 UNIT/ML IJ SOLN
INTRAMUSCULAR | Status: AC
  Filled 2023-12-28: qty 10

## 2023-12-28 MED ORDER — FENTANYL CITRATE (PF) 100 MCG/2ML IJ SOLN
INTRAMUSCULAR | Status: AC | PRN
  Administered 2023-12-28: 25 ug via INTRAVENOUS

## 2023-12-28 MED ORDER — LIDOCAINE-EPINEPHRINE 1 %-1:100000 IJ SOLN
20.0000 mL | Freq: Once | INTRAMUSCULAR | Status: AC
  Administered 2023-12-28: 20 mL via INTRADERMAL

## 2023-12-28 MED ORDER — LIDOCAINE-EPINEPHRINE 1 %-1:100000 IJ SOLN
INTRAMUSCULAR | Status: AC
  Filled 2023-12-28: qty 1

## 2023-12-28 NOTE — Discharge Instructions (Addendum)
 Transportation Needs  - Medicaid transportation services are provided between 6:00 am and 9:00 pm Monday-Friday. The number for Medicaid transportation phone number is [phone]. Call this number for information or to schedule a trip.   Agricultural [employer] by [employer] [facility]): Call [phone] to schedule an appointment. [email or web address] . Eligibility: This program helps people who are older than 59 years old, A notice of at least 5 business days prior to the appointment is necessary, Cost: Free, This program covers residents of the following counties: [place], [place]. Open Monday-Thurs 9am - 5PM; Fri 9am - 2PM

## 2023-12-28 NOTE — Subjective & Objective (Signed)
 Pt seen and examined. Left internal jugular HD catheter removed yesterday. Pt states urology going to take his foley out today. Had HD today.  Pt now states he is going to live in [place] with his mother. Not going home locally to his sister's house like he said yesterday.

## 2023-12-28 NOTE — Procedures (Signed)
Interventional Radiology Procedure Note  Procedure: RT internal jugular TUNNELED HD CATH    Complications: None  Estimated Blood Loss:  MIN  Findings: TIP SVCRA    Sharen Counter, MD

## 2023-12-28 NOTE — Assessment & Plan Note (Signed)
 12/28/23 Noted by wound care care on 12-26-2023. Previously noted Deep tissue pressure injury is beginning to evolve into an Unstageable pressure injury with areas of slough to the edges across bilat buttocks/sacrum, approx 6X6 cm. Was not present on admission.

## 2023-12-28 NOTE — TOC Progression Note (Addendum)
 Transition of Care [facility]) - Progression Note    Patient Details  Name: Glenn Stewart MRN: [id number] Date of Birth: 04/24/1965  Transition of [facility]) CM/SW Contact  Luise JAYSON Pan, [place] Phone Number: 12/28/2023, 11:19 AM  Clinical Narrative:   CSW spoke with patients sister, Lucian, at her request. Tena informed CSW that she has living condition concerns for patient. Lucian stated that she plans to help patient move to [place] to live with their mother. Lucian has concerns about patients dialysis needs. CSW informed Tena that the dialysis navigator assist with finding a dialysis center.   CSW to await PT/OT eval.  CSW notified Dialysis navigator of above conversation.   2:00 PM PT recommended HHPT at this time. CSW notified RNCM, Dialysis Navigator and PT of patients sisters wishes to move patient to [place]. Per Lucian, their mothers address is [address] in [place].    3:24 PM CSW informed Lucian that CSW will provide resources for transportation on patients AVS and through her email ([email or web address]).   CSW will continue to follow.    Expected Discharge Plan: Home/Self Care Barriers to Discharge: Continued Medical Work up               Expected Discharge Plan and Services       Living arrangements for the past 2 months: Single Family Home                                       Social Drivers of Health (SDOH) Interventions SDOH Screenings   Food Insecurity: No Food Insecurity (11/17/2023)  Housing: Low Risk  (11/17/2023)  Transportation Needs: No Transportation Needs (11/17/2023)  Utilities: Not At Risk (11/17/2023)  Tobacco Use: Low Risk  (12/16/2023)    Readmission Risk Interventions    05/19/2022   12:57 PM  Readmission Risk Prevention Plan  Transportation Screening Complete  PCP or Specialist Appt within 5-7 Days Complete  Home Care Screening Complete  Medication Review (RN CM) Complete

## 2023-12-28 NOTE — Progress Notes (Signed)
 Temporary HD cath removed per protocol per MD order. Manual pressure applied for 20 mins. Vaseline gauze, gauze, and Tegaderm applied over insertion site. No bleeding or swelling noted. Instructed patient to remain in bed for thirty mins. Educated patient about S/S of infection and when to call MD; no heavy lifting or pressure on L side side for 24 hours; keep dressing dry and intact for 24 hours. Pt verbalized comprehension.

## 2023-12-28 NOTE — Consult Note (Signed)
 [facility]    Date of Admission:  12/16/2023     Total days of antibiotics 12   Meropenem through 10/8                 Reason for Consult: Intra-abdominal / pelvic abscesses     Referring Provider: Laurence  Primary Care Provider:   Assessment: ZALAN SHIDLER is a 58 y.o. male admitted with:   Intra-abdominal Fluid Collections -  S/P radical prostatectomy -  MSSA -  Sepsis (resolved) - S/P Rt drain placed 12/17/2023.  Recent CT 10/01 demonstrated decreased size after drain placement. Other fluid collections in abdomen described to be improved to unchanged. On 10/1 study. Exam today reveals some mild tenderness with palpation, yellow thin draining fluid in drain bag. He probably needs another 2 weeks or so of treatment pending repeat imaging. He had no concern of organ fistula noted on injection study 10/03.  - Change to cefazolin  post HD + PO Metronidazole  500 mg BID --> EOT tentatively set for 10/18 (3 weeks from drain placement)  - needs ID follow up made tomorrow pending outpatient HD schedule  Enterobacter Cloacea, UTI -  Well treated and will not need to cover further.   SBO/Ileus -  Resolved and on POs now. General surgery signed off.   AKI, Requiring iHD -  Nephrology following with hopes of renal recovery noted. Tunneled line in place Rt chest.   H/O recent manic episode -  Catheter manipulation. Probably best to continue observed therapy with iHD for now barring he goes. Will plan early ID follow up to ensure compliance.   Plan: - Change to cefazolin  post HD + PO Metronidazole  500 mg BID --> EOT tentatively set for 10/18 (3 weeks from drain placement)  - needs ID follow up made tomorrow pending outpatient HD schedule    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   Class 2 obesity with body  mass index (BMI) of 35 to 39.9 without comorbidity    (feeding supplement) PROSource Plus  30 mL Oral TID AC   amLODipine   5 mg Oral Daily   atorvastatin   80 mg Oral Daily   bisacodyl   10 mg Rectal Daily   carvedilol  12.5 mg Oral BID WC   Chlorhexidine  Gluconate Cloth  6 each Topical Daily   feeding supplement  237 mL Oral BID BM   Gerhardt's butt cream   Topical BID   heparin  injection (subcutaneous)  5,000 Units Subcutaneous Q8H   leptospermum manuka honey  1 Application Topical Daily   multivitamin  1 tablet Oral QHS   sevelamer carbonate  1,600 mg Oral TID WC   sodium chloride  flush  10-40 mL Intracatheter Q12H   sodium chloride  flush  5 mL Intracatheter Q8H   thiamine  100 mg Oral Daily    HPI: JAMARII BANKS is a 58 y.o. male   H/O robotic prostatectomy d/t prostate cancer 11/17/2023 with urology team.  Evidently he was irrigating his catheter on his on volition and did not abide by routine post op recs. He also cut the foley and self removed it prior to recommendation. Had 3 days of abx outpatient given recent foley removal and standard post op care. He had swelling of scrotum and mild abdominal distention at that time.  Admitted 9/26 with septic shock picture to ICU and requiring intubation with shock for VT / AFib RVR with distended and tender abdomen. Required vasopressors and supportive care with CL placement. CT scan revealed focal irregularity at the junction of the bladder base and urethra with small anterior contrast pooling suggesting perforated bladder, loculated fluid collections in the abdomen and pelvis c/w abscess. AKI present on admission as well - now requiring RRT with signals of renal recovery. Foley was reinserted for 7-10d plan. Large abscess was noted on imaging (initially thought to be urinoma, however creatinine within that fluid was not c/w that).   Complicated with ileus vs SBO after surgery with general surgery following - indicating this had resolved as  of 10/06 and since signed off.  Initially placed on linezoild + zosyn  - changed to meropenem after urine cultures grew out enterobacter cloacea and he continued to fever.  s/p IR drainage of RLQ/pelvic collection 9/27 (Cx abundant MSSA).   TDC placed 10/07 for HD access.   Repeat CT scan on 10/3 - no evidence of bowel or urinary fistula; contrast noted to fill irregular collection along lateral aspect of rt lower abdomen.    Review of Systems: ROS  Past Medical History:  Diagnosis Date   ADHD (attention deficit hyperactivity disorder)    Anxiety state, unspecified    Atrial fibrillation with RVR (HCC) 12/19/2023   Bipolar 1 disorder (HCC)    Cancer (HCC)    CHEST PAIN 12/04/2008   Qualifier: Diagnosis of  By: [name] MD, [name]    Chest pain, rule out acute myocardial infarction 05/10/2022   Coronary artery disease    Depressive disorder, not elsewhere classified    Dermatophytosis of nail    Esophageal reflux    History of kidney stones    HYPERGLYCEMIA 07/10/2007   Qualifier: Diagnosis of  By: [name] MD, [name]    Low back pain 09/18/2013   Other chronic nonalcoholic liver disease    Pre-diabetes    Problems with hearing    Prostatitis, unspecified    Pure hypercholesterolemia    Screen for STD (sexually transmitted disease) 04/20/2011   Sprain of cruciate ligament of knee    Tear of medial cartilage or meniscus of knee, current    TMJ (temporomandibular joint disorder) 12/25/2010   Unspecified essential hypertension    Unspecified gastritis and gastroduodenitis without mention of hemorrhage    Urethritis, unspecified     Social History   Tobacco Use   Smoking status: Never   Smokeless tobacco: Never  Vaping Use   Vaping status: Never Used  Substance Use Topics   Alcohol use: Yes    Comment: rare   Drug use: No    Family History  Problem Relation Age of Onset   Diabetes Father    Hypertension Father    Heart failure Father    Allergies  Allergen  Reactions   Sulfonamide Derivatives Hives    OBJECTIVE: Blood pressure (!) 155/87, pulse 71, temperature 97.8 F (36.6 C), resp. rate 18, height 5' 5 (1.651 m), weight 93.4 kg, SpO2 99%.  Physical Exam  Lab Results Lab Results  Component Value Date   WBC 11.0 (H) 12/28/2023   HGB 10.8 (L) 12/28/2023   HCT 33.3 (L) 12/28/2023   MCV 82.4 12/28/2023   PLT 411 (H) 12/28/2023    Lab Results  Component Value Date   CREATININE 6.26 (H) 12/28/2023   BUN 125 (H) 12/28/2023   NA 130 (L) 12/28/2023  K 5.1 12/28/2023   CL 92 (L) 12/28/2023   CO2 18 (L) 12/28/2023    Lab Results  Component Value Date   ALT 27 12/26/2023   AST 39 12/26/2023   ALKPHOS 92 12/26/2023   BILITOT 1.1 12/26/2023     Microbiology: Recent Results (from the past 240 hours)  Culture, blood (Routine X 2) w Reflex to ID Panel     Status: None   Collection Time: 12/20/23 11:31 PM   Specimen: BLOOD LEFT HAND  Result Value Ref Range Status   Specimen Description BLOOD LEFT HAND  Final   Special Requests   Final    BOTTLES DRAWN AEROBIC AND ANAEROBIC Blood Culture adequate volume   Culture   Final    NO GROWTH 5 DAYS Performed at [facility], [address], [place], [place] [address]    Report Status 12/26/2023 FINAL  Final  Culture, blood (Routine X 2) w Reflex to ID Panel     Status: None   Collection Time: 12/21/23 12:44 AM   Specimen: BLOOD LEFT HAND  Result Value Ref Range Status   Specimen Description BLOOD LEFT HAND  Final   Special Requests   Final    BOTTLES DRAWN AEROBIC AND ANAEROBIC Blood Culture adequate volume   Culture   Final    NO GROWTH 5 DAYS Performed at [facility], [address] [place], [place] [address]    Report Status 12/26/2023 FINAL  Final  Culture, Respiratory w Gram Stain     Status: None   Collection Time: 12/21/23  7:57 AM   Specimen: Tracheal Aspirate; Respiratory  Result Value Ref Range Status   Specimen Description TRACHEAL ASPIRATE  Final    Special Requests NONE  Final   Gram Stain   Final    FEW WBC PRESENT, PREDOMINANTLY PMN RARE YEAST Performed at [facility], [address] [place], [place] [address]    Culture   Final    FEW CANDIDA DUBLINIENSIS RARE FUNGUS (MOLD) ISOLATED, PROBABLE CONTAMINANT/COLONIZER (SAPROPHYTE). CONTACT MICROBIOLOGY IF FURTHER IDENTIFICATION REQUIRED [phone].    Report Status 12/24/2023 FINAL  Final  MRSA Next Gen by PCR, Nasal     Status: None   Collection Time: 12/21/23  9:32 AM   Specimen: Nasal Mucosa; Nasal Swab  Result Value Ref Range Status   MRSA by PCR Next Gen NOT DETECTED NOT DETECTED Final    Comment: (NOTE) The GeneXpert MRSA Assay (FDA approved for NASAL specimens only), is one component of a comprehensive MRSA colonization surveillance program. It is not intended to diagnose MRSA infection nor to guide or monitor treatment for MRSA infections. Test performance is not FDA approved in patients less than 73 years old. Performed at [facility], [address], [place], [place] [address]     Corean Fireman, MSN, NP-C [facility] Pager: [phone]  12/28/2023 3:46 PM    Total Encounter Time: 40 m

## 2023-12-28 NOTE — Assessment & Plan Note (Signed)
 Prior to 12/28/23 Ventricular tachycardia sp cardioversion.   Continue with carvedilol 12.5 mg po bid.  Patient not on anticoagulation. Continue telemetry monitoring EP has been consulted with recommendations to stop amiodarone  and continue close monitoring.   12/28/23 cards/EP felt that pt's VT was due to his sepsis. Amio stopped. On coreg 12.5 mg bid. Ziopatch ordered to be placed prior to discharge.

## 2023-12-28 NOTE — Progress Notes (Signed)
 Subjective: Glenn Stewart. Very hard of hearing. Discussed TOV.   Objective: Vital signs in last 24 hours: Temp:  [97.5 F (36.4 C)-98.6 F (37 C)] 97.8 F (36.6 C) (10/08 1110) Pulse Rate:  [64-94] 71 (10/08 1110) Resp:  [13-28] 18 (10/08 1110) BP: (115-178)/(70-130) 155/87 (10/08 1110) SpO2:  [96 %-100 %] 99 % (10/08 1110) Weight:  [93.4 kg] 93.4 kg (10/07 1917)  Assessment/Plan: # SBO with intra-abdominal fluid collection/abscess # Prostate cancer-s/p robotic prostatectomy # renal failure  Drain creatinine was consistent with serum, no evidence of broad leakage.  Small anastomotic leak that was contained present on cystogram.  Foley catheter in place for 7 days with plan for drain interrogation versus repeat cystogram and voiding trial.  Considering relatively high volume of drain output-250 mL over the last 24 hours, will review case with interventional radiology prior to ordering imaging.  Hopefully voiding trial within the next day or 2.  Pending initiation of hemodialysis.  Nephrology following  Intake/Output from previous day: 10/07 0701 - 10/08 0700 In: 570 [P.O.:360; IV Piggyback:200] Out: 3575 [Urine:2900; Drains:225; Stool:450]  Intake/Output this shift: Total I/O In: 135 [P.O.:120; I.V.:15] Out: -   Physical Exam:  General: Alert and oriented CV: No cyanosis Lungs: equal chest rise Abdomen: Soft, NTND, no rebound or guarding Gu: foley in place draining clear yellow urine  Lab Results: Recent Labs    12/28/23 0232  HGB 10.8*  HCT 33.3*   BMET Recent Labs    12/27/23 0813 12/28/23 0232  NA 130* 130*  K 4.7 5.1  CL 95* 92*  CO2 17* 18*  GLUCOSE 122* 126*  BUN 143* 125*  CREATININE 7.44* 6.26*  CALCIUM  7.8* 7.4*  HGB  --  10.8*  WBC  --  11.0*     Studies/Results: IR TUNNELED CENTRAL VENOUS CATH PLC W IMG Result Date: 12/28/2023 INDICATION: RENAL FAILURE, ACCESS FOR LONG-TERM USE EXAM: ULTRASOUND GUIDANCE FOR VASCULAR ACCESS RIGHT INTERNAL  JUGULAR PERMANENT HEMODIALYSIS CATHETER Date:  12/28/2023 12/28/2023 8:50 am Radiologist:  CHRISTELLA. Frederic Specking, MD Guidance:  ULTRASOUND AND FLUOROSCOPIC FLUOROSCOPY: Fluoroscopy Time: 0 minutes 36 seconds (3.7 mGy). MEDICATIONS: ANCEF  2 G WITHIN 1 HOUR OF THE PROCEDURE ANESTHESIA/SEDATION: Versed  1.0 mg IV; Fentanyl  25 mcg IV; Moderate Sedation Time:  13 minutes The patient was continuously monitored during the procedure by the interventional radiology nurse under my direct supervision. CONTRAST:  None. COMPLICATIONS: None immediate. PROCEDURE: Informed consent was obtained from the patient following explanation of the procedure, risks, benefits and alternatives. The patient understands, agrees and consents for the procedure. All questions were addressed. A time out was performed. Maximal barrier sterile technique utilized including caps, mask, sterile gowns, sterile gloves, large sterile drape, hand hygiene, and 2% chlorhexidine  scrub. Under sterile conditions and local anesthesia, right internal jugular micropuncture venous access was performed with ultrasound. Images were obtained for documentation of the patent right internal jugular vein. A guide wire was inserted followed by a transitional dilator. Next, a 0.035 guidewire was advanced into the IVC with a 5-French catheter. Measurements were obtained from the right venotomy site to the proximal right atrium. In the right infraclavicular chest, a subcutaneous tunnel was created under sterile conditions and local anesthesia. 1% lidocaine  with epinephrine  was utilized for this. The 19 cm tip to cuff palindrome catheter was tunneled subcutaneously to the venotomy site and inserted into the SVC/RA junction through a valved peel-away sheath. Position was confirmed with fluoroscopy. Images were obtained for documentation. Blood was aspirated from the catheter  followed by saline and heparin  flushes. The appropriate volume and strength of heparin  was instilled in each lumen.  Caps were applied. The catheter was secured at the tunnel site with Gelfoam and a pursestring suture. The venotomy site was closed with subcuticular Vicryl suture. Dermabond was applied to the small right neck incision. A dry sterile dressing was applied. The catheter is ready for use. No immediate complications. IMPRESSION: Ultrasound and fluoroscopically guided right internal jugular tunneled hemodialysis catheter (19 cm tip to cuff palindrome catheter). Electronically Signed   By: CHRISTELLA.  Shick M.D.   On: 12/28/2023 10:48      LOS: 12 days   Ole Bourdon, NP [facility] Pager: [phone]  12/28/2023, 2:15 PM

## 2023-12-28 NOTE — Plan of Care (Signed)
  Problem: Education: Goal: Knowledge of the procedure and recovery process will improve Outcome: Progressing   Problem: Pain Management: Goal: General experience of comfort will improve Outcome: Progressing   Problem: Clinical Measurements: Goal: Diagnostic test results will improve Outcome: Progressing

## 2023-12-28 NOTE — Progress Notes (Addendum)
 PROGRESS NOTE    Glenn Stewart  FMW:[id number] DOB: February 20, 1966 DOA: 12/16/2023 PCP: Pcp, No  Subjective: Pt seen and examined. Had right permcath placed for HD. VATS team removed left internal jugular dialysis catheter.   Hospital Course: CC: Pt was at urology appt and was in and out of consciousness. Severe abd pain on right side. Abd distention. Pt arrives in afib RVR. Pts O2 was in 60's. BP 140s.   HPI: 58 yoM with PMH of HTN, CAD, CABG x 5 04/2022 with post op Afib since stopped on Eliquis  and amiodarone  09/2022, HTN, HLD, GERD, NASH, bipolar/ ADHD and s/p recent robotic assisted laparoscopic radial prostatectomy on 11/17/23 who presented for weakness and waxing mental status with distended and tender abdominal by EMS found with episodes of Afib with RVR and VT.  Was hemodynamically unstable on arrival, underwent emergent cardioversion and given amiodarone  x2 and magnesium , with emerging rhythm of afib with RVR possible flutter.  Cardiology consulted. Post cardioversion became hypotensive with respiratory distress therefore intubated and placed on norepinephrine .  Difficulty oxygenating post intubation with normal PIP requiring 1.0 and 12 PEEP.  Labs noted for K2.8, Na 129, bicarb 16, BUN/ sCr 55/ 3.92 (recently  ), t. Bili 3.4, AST 68, albumin  1.6, protein 3.4, INR 1.2.  CTH neg, CTA PE and CT a/p w/contrast pending official read.  2.1L of brownish OGT contents s/p OGT placement. Cultures sent, empiric zosyn / vanc started.  Afebrile, anuric and since weaned off pressors.  R internal jugular CVL placed by EDP.  PCCM consulted for admission.     patient self-irrigated catheter a couple days after initial surgery and then removed catheter himself prior to follow-up at 1 week. Patient reported that he cut tubing prior to removal but unable to confirm if removal was truly atraumat   Significant Events: Admitted 12/16/2023 to ICU by PCCM for septic shock, respiratory failure with hypoxia,  intra-abdominal abscesses 12-16-2023 intubated in ER. Emergently cardioverted due to sustained ventricular tachycardia. Due to hypotension, started on IV levophed  09/27 s/p IR drainage of RLQ/pelvic collection (Cx abundant staph); 300 mL  9/28 - On vent. Ongoing CRRT. Seen by surgery team - still concernd about Urinoma and secondary bowel irritation and not a true obstruction. NGT output is low c/w ileus. CCS does  NOT want enteral medication. Recommnding max only trophic feds. CUlture negative. Afebrile.  Stopped on Zyvox  and Zosyn  9/29 SBO Xr protocol per Gen Surgery with oral contrast pooling in the gastric fundus. MSSA in abdominal cultures and Enterobacter cloacae in Ucx - on Cefepime and Flagyl  SBT trial failed secondary to RR >40 and increased WOB. Back on PRVC 9/30 discontinued CRRT.  10/1 - switched to meropenem. Repeat CT A/P with stable intraabdominal abscesses. Started iHD 10/03 liberated from mechanical ventilation.  10/05 transfer to [facility].  10/06 plan for HD tunneled catheter  10/97 rectal tube has been removed.  Admission Labs: Na 129, K 2.8, CO2 of 16, BUN 55, Scr 3.92, glu 118, Ca 6.3 T. Prot 5.1, alb 1.6, AST 68, ALT 37, alk phos 69, t. Bili 3.4,  WBC 6.0, HgB 12.6, Plt 324 Triglycerides 145 Urine drug screen negative UA cloudy, moderate HgB, negative nitrite, Large LE, WBC >50, rare bacteria ABG pH 7.219, PCO2 of 54, PO2 of 74  Admission Imaging Studies: CXR Well-positioned endotracheal and esophagogastric tubes. No pneumothorax. 2. Markedly low lung volumes.  No pneumonia or pleural effusion. CT head No acute intracranial pathology.  CTPA, CT abd/pelvis No evidence of significant pulmonary  embolus. Fluid-filled distended small bowel with decompressed terminal ileum consistent with small-bowel obstruction. 2. Irregular wall thickening diffusely throughout the distal small bowel and right colon suggesting enterocolitis or inflammatory bowel disease. Inflammatory strictures  may be present. 3. Multiple loculated fluid collections, some with gas, demonstrated in the right abdomen and pelvis indicating abscesses. This is likely due to perforation, possibly related to inflammatory bowel disease or to an occult perforated appendicitis. The appendix is self is not demonstrated. No free intra-abdominal air. 4. Small amount of free fluid in the abdomen is likely reactive. 5. Cholelithiasis without evidence of acute cholecystitis. 6. Aortic atherosclerosis. 7. Consolidation versus compressive atelectasis in both lung bases. Small bilateral pleural effusions. CT cystogram CT cystogram with Foley catheter in place. Contrast material leaks along the catheter through the urethra. There is focal irregularity at the junction of the bladder base and urethra with small anterior contrast pooling suggesting contained perforation. No intraperitoneal contrast extravasation. 2. Appearance examination is otherwise unchanged. Again demonstrated are multiple loculated fluid collections in the abdomen and pelvis consistent with abscesses. Wall thickening of the cecum and terminal ileum. Fluid-filled dilated small bowel appear mildly improved.  Significant Labs: 12-16-2023 urine cx enterobacter cloacae 12-17-2023 abscess culture. MSSA Staph aureus  Significant Imaging Studies:   Antibiotic Therapy: Anti-infectives (From admission, onward)    Start     Dose/Rate Route Frequency Ordered Stop   12/21/23 1600  meropenem (MERREM) 1 g in sodium chloride  0.9 % 100 mL IVPB        1 g 200 mL/hr over 30 Minutes Intravenous Every 24 hours 12/21/23 0853 12/28/23 2359   12/19/23 1045  ceFEPIme (MAXIPIME) 2 g in sodium chloride  0.9 % 100 mL IVPB  Status:  Discontinued        2 g 200 mL/hr over 30 Minutes Intravenous Every 12 hours 12/19/23 0955 12/21/23 0843   12/19/23 1045  metroNIDAZOLE  (FLAGYL ) IVPB 500 mg  Status:  Discontinued        500 mg 100 mL/hr over 60 Minutes Intravenous Every 12 hours  12/19/23 0955 12/21/23 0843   12/17/23 1600  piperacillin -tazobactam (ZOSYN ) IVPB 2.25 g  Status:  Discontinued        2.25 g 100 mL/hr over 30 Minutes Intravenous Every 8 hours 12/17/23 0931 12/17/23 1144   12/17/23 1600  piperacillin -tazobactam (ZOSYN ) IVPB 3.375 g  Status:  Discontinued        3.375 g 100 mL/hr over 30 Minutes Intravenous Every 6 hours 12/17/23 1157 12/19/23 0955   12/17/23 1230  piperacillin -tazobactam (ZOSYN ) IVPB 3.375 g  Status:  Discontinued        3.375 g 100 mL/hr over 30 Minutes Intravenous Every 6 hours 12/17/23 1144 12/17/23 1157   12/17/23 1000  linezolid  (ZYVOX ) IVPB 600 mg  Status:  Discontinued        600 mg 300 mL/hr over 60 Minutes Intravenous Every 12 hours 12/16/23 1924 12/19/23 0958   12/17/23 0000  piperacillin -tazobactam (ZOSYN ) IVPB 3.375 g  Status:  Discontinued        3.375 g 12.5 mL/hr over 240 Minutes Intravenous Every 8 hours 12/16/23 1924 12/17/23 0931   12/16/23 1600  piperacillin -tazobactam (ZOSYN ) IVPB 3.375 g        3.375 g 12.5 mL/hr over 240 Minutes Intravenous  Once 12/16/23 1551 12/16/23 1602   12/16/23 1600  vancomycin  (VANCOREADY) IVPB 1750 mg/350 mL        1,750 mg 175 mL/hr over 120 Minutes Intravenous  Once 12/16/23 1551 12/16/23 1816  Procedures: 12-16-2023 intubation, cardioversion, Right internal jugular CVL, arterial line insertion 12-17-2023 IR drain placement into RLQ abd abscess  Consultants: PCCM Cardiology Urology General surgery ID    Assessment and Plan: * Intra-abdominal abscess (HCC) Prior to 12/28/23 Septic shock resolved.  Multiple abdominal abscesses due to MSSA.  Urinary tract infection due to enterobacter cloacae.  Initially placed on metronidazole  and cefepime, then because persistent fever has been changed to meropenem.  Follow up CT abdomen and pelvis with stable or improvement in abdominal abscess.   Follow up cell count is 11.0  Cultures from blood have been no growth.   Plan to  continue antibiotic therapy with meropenem (started on 10/01).  Will plan for now to continue antibiotic therapy until 10/08   12/28/23 pt has completed 10 days of Cefepime/Meropenem for enterobacter cloacae abscess. Asking ID if he needs more IV/PO abx. Asking urology for input about his intra-abd drains that were placed by IR.  General surgery signed off on 12-26-2023.   Acute renal failure on dialysis Prior to 12/28/23  ATN. Hyponatremia hyperkalemia.  09/30 Stopped CRRT  10/01 started intermittent hemodialysis.   Urine output is 2250 cc (foley in place)  BUN 143, cr 7,44, with K at 4.7 and serum bicarbonate at 17  Na 130   Plan for tunneled HD access.    Patient continue renal replacement therapy with hemodialysis, per nephrology recommendations   VT (ventricular tachycardia) (HCC)-resolved as of 12/28/2023 Prior to 12/28/23 Ventricular tachycardia sp cardioversion.   Continue with carvedilol 12.5 mg po bid.  Patient not on anticoagulation. Continue telemetry monitoring EP has been consulted with recommendations to stop amiodarone  and continue close monitoring.   12/28/23 cards/EP felt that pt's VT was due to his sepsis. Amio stopped. On coreg 12.5 mg bid. Ziopatch ordered to be placed prior to discharge.   Class 2 obesity with body mass index (BMI) of 35 to 39.9 without comorbidity Body mass index is 34.27 kg/m.   Prostate cancer [facility]) Prior to 12/28/23 Sp robotic assisted laparoscopic prostatectomy on 11/17/23 Continue foley catheter, plan to repeat cystogram 10/7 to 10/10 per urology.   12/28/23 urology following    Coronary artery disease 12/28/23 No chest pain, no acute coronary syndrome.   Essential hypertension 12/28/23 Continue blood pressure control with amlodipine  and carvedilol.   Atrial fibrillation with RVR (HCC)-resolved as of 12/28/2023  >>ASSESSMENT AND PLAN FOR PAF (PAROXYSMAL ATRIAL FIBRILLATION) (HCC) WRITTEN ON 12/28/2023  1:46 PM BY Treniyah Lynn,  Rashada Klontz, DO  Prior to 12/28/23 Ventricular tachycardia sp cardioversion.   Continue with carvedilol 12.5 mg po bid.  Patient not on anticoagulation. Continue telemetry monitoring EP has been consulted with recommendations to stop amiodarone  and continue close monitoring.   12/28/23 cards/EP felt that pt's VT was due to his sepsis. Amio stopped. On coreg 12.5 mg bid. Ziopatch ordered to be placed prior to discharge.   Bilateral pneumonia-resolved as of 12/28/2023 Prior to 12/28/23 (Present on admission)  Acute hypoxemic and hypercapnic respiratory failure.  Moderate ARDS.  Required invasive mechanical ventilation.   Completed antibiotic therapy 02 saturation today is 100% on room air.   12/28/23 resolved.   Ileus (HCC)-resolved as of 12/28/2023 Prior to 12/28/23 Patient has been weaned off TPN and now tolerating po well.  Ileus has resolved, advanced to soft today.  Surgery has signed off 12-26-2023  Acute metabolic encephalopathy-resolved as of 12/28/2023 Prior to 12/28/23 Mentation has been improving, continue with mild confusion but not agitation. Continue nutritional support, PT and OT.  Neuro checks per unit protocol.   12/28/23 resolved.   Pressure injury of buttock, unstageable (HCC) 12/28/23 Noted by wound care care on 12-26-2023. Previously noted Deep tissue pressure injury is beginning to evolve into an Unstageable pressure injury with areas of slough to the edges across bilat buttocks/sacrum, approx 6X6 cm. Was not present on admission.    DVT prophylaxis: heparin  injection 5,000 Units Start: 12/17/23 1400 SCDs Start: 12/16/23 1727    Code Status: Full Code Family Communication: pt is decisional Disposition Plan: pt states he is going to live with his little sister at discharge. Reason for continuing need for hospitalization: remains on HD. Needs PT assessment.  Objective: Vitals:   12/28/23 1110 12/28/23 1545 12/28/23 1613 12/28/23 1951  BP: (!) 155/87  130/73 (!) 147/84   Pulse: 71 (!) 43 85 82  Resp: 18 12 17  (!) 22  Temp: 97.8 F (36.6 C)   98.2 F (36.8 C)  TempSrc:   Oral Oral  SpO2: 99% 99% 99%   Weight:      Height:        Intake/Output Summary (Last 24 hours) at 12/28/2023 2252 Last data filed at 12/28/2023 1959 Gross per 24 hour  Intake 345 ml  Output 4110 ml  Net -3765 ml   Filed Weights   12/24/23 1215 12/27/23 0515 12/27/23 1917  Weight: 90 kg 93.4 kg 93.4 kg    Examination:  Physical Exam Vitals and nursing note reviewed.  Constitutional:      General: He is not in acute distress.    Appearance: He is obese. He is not toxic-appearing.  HENT:     Head: Normocephalic and atraumatic.  Eyes:     General: No scleral icterus. Cardiovascular:     Rate and Rhythm: Normal rate and regular rhythm.  Pulmonary:     Effort: Pulmonary effort is normal.     Breath sounds: Normal breath sounds.  Abdominal:     General: Bowel sounds are normal. There is no distension.     Palpations: Abdomen is soft.     Comments: RLQ abd drain  Musculoskeletal:     Right lower leg: No edema.     Left lower leg: No edema.     Comments: Right ant chest permcath  Left internal jugular HD catheter  Skin:    General: Skin is warm and dry.     Capillary Refill: Capillary refill takes less than 2 seconds.  Neurological:     Mental Status: He is alert and oriented to person, place, and time.     Data Reviewed: I have personally reviewed following labs and imaging studies  CBC: Recent Labs  Lab 12/22/23 0359 12/23/23 0638 12/23/23 1129 12/28/23 0232  WBC 13.7* 11.0*  --  11.0*  NEUTROABS 10.7*  --   --   --   HGB 10.6* 11.3* 10.5* 10.8*  HCT 31.8* 35.1* 31.0* 33.3*  MCV 80.1 81.4  --  82.4  PLT 338 359  --  411*   Basic Metabolic Panel: Recent Labs  Lab 12/22/23 0359 12/22/23 1527 12/23/23 9361 12/23/23 1129 12/24/23 0512 12/25/23 0630 12/26/23 0316 12/27/23 0813 12/28/23 0232  NA 132*   < > 133*   < > 134* 135  133* 130* 130*  K 5.2*   < > 4.4   < > 4.1 4.1 4.5 4.7 5.1  CL 96*   < > 95*  --  98 95* 95* 95* 92*  CO2 22   < > 23  --  19* 23 20* 17* 18*  GLUCOSE 123*   < > 161*  --  202* 113* 99 122* 126*  BUN 77*   < > 75*  --  113* 89* 114* 143* 125*  CREATININE 4.29*   < > 4.25*  --  5.92* 5.56* 6.71* 7.44* 6.26*  CALCIUM  7.6*   < > 7.7*  --  7.5* 7.8* 8.0* 7.8* 7.4*  MG 2.4  --  2.4  --  2.4 2.2 2.4  --   --   PHOS 7.4*   < > 6.9*  --  6.4* 6.4* 8.9* 11.4*  --    < > = values in this interval not displayed.   GFR: Estimated Creatinine Clearance: 13.4 mL/min (A) (by C-G formula based on SCr of 6.26 mg/dL (H)). Liver Function Tests: Recent Labs  Lab 12/22/23 0359 12/22/23 1527 12/23/23 9361 12/24/23 0512 12/25/23 0630 12/26/23 0316 12/27/23 0813  AST 36  --   --  32 30 39  --   ALT 25  --   --  20 20 27   --   ALKPHOS 97  --   --  90 82 92  --   BILITOT 1.0  --   --  0.7 0.9 1.1  --   PROT 6.0*  --   --  6.0* 5.9* 6.6  --   ALBUMIN  <1.5*  <1.5*   < > 1.6* 1.6* 1.6* 1.9* 2.0*   < > = values in this interval not displayed.   CBG: Recent Labs  Lab 12/28/23 0105 12/28/23 0455 12/28/23 1109 12/28/23 1536 12/28/23 2041  GLUCAP 178* 126* 124* 137* 100*    Recent Results (from the past 240 hours)  Culture, blood (Routine X 2) w Reflex to ID Panel     Status: None   Collection Time: 12/20/23 11:31 PM   Specimen: BLOOD LEFT HAND  Result Value Ref Range Status   Specimen Description BLOOD LEFT HAND  Final   Special Requests   Final    BOTTLES DRAWN AEROBIC AND ANAEROBIC Blood Culture adequate volume   Culture   Final    NO GROWTH 5 DAYS Performed at [facility], [address] [place], [place] [address]    Report Status 12/26/2023 FINAL  Final  Culture, blood (Routine X 2) w Reflex to ID Panel     Status: None   Collection Time: 12/21/23 12:44 AM   Specimen: BLOOD LEFT HAND  Result Value Ref Range Status   Specimen Description BLOOD LEFT HAND  Final   Special  Requests   Final    BOTTLES DRAWN AEROBIC AND ANAEROBIC Blood Culture adequate volume   Culture   Final    NO GROWTH 5 DAYS Performed at [facility], [address], [place], [place] [address]    Report Status 12/26/2023 FINAL  Final  Culture, Respiratory w Gram Stain     Status: None   Collection Time: 12/21/23  7:57 AM   Specimen: Tracheal Aspirate; Respiratory  Result Value Ref Range Status   Specimen Description TRACHEAL ASPIRATE  Final   Special Requests NONE  Final   Gram Stain   Final    FEW WBC PRESENT, PREDOMINANTLY PMN RARE YEAST Performed at [facility], [address], [place], [place] [address]    Culture   Final    FEW CANDIDA DUBLINIENSIS RARE FUNGUS (MOLD) ISOLATED, PROBABLE CONTAMINANT/COLONIZER (SAPROPHYTE). CONTACT MICROBIOLOGY IF FURTHER IDENTIFICATION REQUIRED [phone].    Report Status 12/24/2023  FINAL  Final  MRSA Next Gen by PCR, Nasal     Status: None   Collection Time: 12/21/23  9:32 AM   Specimen: Nasal Mucosa; Nasal Swab  Result Value Ref Range Status   MRSA by PCR Next Gen NOT DETECTED NOT DETECTED Final    Comment: (NOTE) The GeneXpert MRSA Assay (FDA approved for NASAL specimens only), is one component of a comprehensive MRSA colonization surveillance program. It is not intended to diagnose MRSA infection nor to guide or monitor treatment for MRSA infections. Test performance is not FDA approved in patients less than 69 years old. Performed at [facility], [address] [place], [place] [address]      Radiology Studies: IR TUNNELED CENTRAL VENOUS CATH [facility] W IMG Result Date: 12/28/2023 INDICATION: RENAL FAILURE, ACCESS FOR LONG-TERM USE EXAM: ULTRASOUND GUIDANCE FOR VASCULAR ACCESS RIGHT INTERNAL JUGULAR PERMANENT HEMODIALYSIS CATHETER Date:  12/28/2023 12/28/2023 8:50 am Radiologist:  M. Frederic Specking, MD Guidance:  ULTRASOUND AND FLUOROSCOPIC FLUOROSCOPY: Fluoroscopy Time: 0 minutes 36 seconds (3.7 mGy). MEDICATIONS:  ANCEF  2 G WITHIN 1 HOUR OF THE PROCEDURE ANESTHESIA/SEDATION: Versed  1.0 mg IV; Fentanyl  25 mcg IV; Moderate Sedation Time:  13 minutes The patient was continuously monitored during the procedure by the interventional radiology nurse under my direct supervision. CONTRAST:  None. COMPLICATIONS: None immediate. PROCEDURE: Informed consent was obtained from the patient following explanation of the procedure, risks, benefits and alternatives. The patient understands, agrees and consents for the procedure. All questions were addressed. A time out was performed. Maximal barrier sterile technique utilized including caps, mask, sterile gowns, sterile gloves, large sterile drape, hand hygiene, and 2% chlorhexidine  scrub. Under sterile conditions and local anesthesia, right internal jugular micropuncture venous access was performed with ultrasound. Images were obtained for documentation of the patent right internal jugular vein. A guide wire was inserted followed by a transitional dilator. Next, a 0.035 guidewire was advanced into the IVC with a 5-French catheter. Measurements were obtained from the right venotomy site to the proximal right atrium. In the right infraclavicular chest, a subcutaneous tunnel was created under sterile conditions and local anesthesia. 1% lidocaine  with epinephrine  was utilized for this. The 19 cm tip to cuff palindrome catheter was tunneled subcutaneously to the venotomy site and inserted into the SVC/RA junction through a valved peel-away sheath. Position was confirmed with fluoroscopy. Images were obtained for documentation. Blood was aspirated from the catheter followed by saline and heparin  flushes. The appropriate volume and strength of heparin  was instilled in each lumen. Caps were applied. The catheter was secured at the tunnel site with Gelfoam and a pursestring suture. The venotomy site was closed with subcuticular Vicryl suture. Dermabond was applied to the small right neck incision. A  dry sterile dressing was applied. The catheter is ready for use. No immediate complications. IMPRESSION: Ultrasound and fluoroscopically guided right internal jugular tunneled hemodialysis catheter (19 cm tip to cuff palindrome catheter). Electronically Signed   By: CHRISTELLA.  Shick M.D.   On: 12/28/2023 10:48    Scheduled Meds:  (feeding supplement) PROSource Plus  30 mL Oral TID AC   amLODipine   5 mg Oral Daily   atorvastatin   80 mg Oral Daily   bisacodyl   10 mg Rectal Daily   carvedilol  12.5 mg Oral BID WC   Chlorhexidine  Gluconate Cloth  6 each Topical Daily   feeding supplement  237 mL Oral BID BM   Gerhardt's butt cream   Topical BID   heparin  injection (subcutaneous)  5,000 Units Subcutaneous Q8H   leptospermum manuka honey  1 Application Topical Daily   metroNIDAZOLE   500 mg Oral Q12H   multivitamin  1 tablet Oral QHS   sevelamer carbonate  1,600 mg Oral TID WC   sodium chloride  flush  10-40 mL Intracatheter Q12H   sodium chloride  flush  5 mL Intracatheter Q8H   thiamine  100 mg Oral Daily   Continuous Infusions:  [START ON 12/29/2023]  ceFAZolin  (ANCEF ) IV       LOS: 12 days   Time spent: 60 minutes  Camellia Door, DO  [facility]  12/28/2023, 10:52 PM

## 2023-12-28 NOTE — Evaluation (Addendum)
 Physical Therapy Evaluation Patient Details Name: Glenn Stewart MRN: [id number] DOB: 1965-07-09 Today's Date: 12/28/2023  History of Present Illness  Pt is 58 year old presented to [facility] on  12/16/23 for weakness and tender abdomen. Pt with afib with rvr and vtach and underwent emergent cardioversion. Post cardioversion hypotension and resp distress and intubated. Pt with shock and AKI and intraabdominal fluid collections concerning for urinoma. Abdominal drain placement on 9/27. Pt started on CRRT and transitioned to HD on 10/1. AbPMH - 8/28 prostatectomy, htn, CABG, afib, NASH, bipolar, ADHD  Clinical Impression  Pt admitted with above diagnosis and presents to PT with functional limitations due to deficits listed below (See PT problem list). Pt needs skilled PT to maximize independence and safety. Pt typically independent at home alone. Plans to stay with sister after dc to recuperate. Able to amb short distance in room with assist today after being in bed primarily for 11 days. Expect he will make good progress with mobility. Currently using a walker but he may not need that at time of dc. Also will consider HHPT although he may progress beyond that as well.           If plan is discharge home, recommend the following: A little help with walking and/or transfers;A little help with bathing/dressing/bathroom;Assistance with cooking/housework;Assist for transportation;Help with stairs or ramp for entrance   Can travel by private vehicle        Equipment Recommendations Rolling walker (2 wheels)  Recommendations for Other Services       Functional Status Assessment Patient has had a recent decline in their functional status and demonstrates the ability to make significant improvements in function in a reasonable and predictable amount of time.     Precautions / Restrictions Precautions Precautions: Fall;Other (comment) Precaution/Restrictions Comments: Abdominal  drain Restrictions Weight Bearing Restrictions Per Provider Order: No      Mobility  Bed Mobility Overal bed mobility: Needs Assistance Bed Mobility: Supine to Sit     Supine to sit: Mod assist     General bed mobility comments: Assist to elevate trunk primarily due to pt with recent insertion of HD cath and removal of temporary cath and he isn't to pull with UE's    Transfers Overall transfer level: Needs assistance Equipment used: Rolling walker (2 wheels) Transfers: Sit to/from Stand Sit to Stand: Min assist           General transfer comment: Assist to power up and stabilize    Ambulation/Gait Ambulation/Gait assistance: Min assist Gait Distance (Feet): 40 Feet Assistive device: Rolling walker (2 wheels) Gait Pattern/deviations: Step-through pattern, Decreased step length - right, Decreased step length - left, Decreased stride length, Knee flexed in stance - right, Knee flexed in stance - left Gait velocity: decr Gait velocity interpretation: <1.31 ft/sec, indicative of household ambulator   General Gait Details: Assist for balance and support  Stairs            Wheelchair Mobility     Tilt Bed    Modified Rankin (Stroke Patients Only)       Balance Overall balance assessment: Needs assistance Sitting-balance support: No upper extremity supported, Feet supported Sitting balance-Leahy Scale: Fair     Standing balance support: Bilateral upper extremity supported, During functional activity Standing balance-Leahy Scale: Poor Standing balance comment: walker and CGA for static standing  Pertinent Vitals/Pain Pain Assessment Pain Assessment: Faces Faces Pain Scale: No hurt    Home Living Family/patient expects to be discharged to:: Private residence Living Arrangements: Alone;Other relatives (Will stay with sister) Available Help at Discharge: Family;Available PRN/intermittently Type of Home:  House Home Access: Stairs to enter   Entrance Stairs-Number of Steps: 3   Home Layout: Two level;Bed/bath upstairs Home Equipment: None Additional Comments: Pt planning to go to sister's home at dc. Above info is for her home    Prior Function Prior Level of Function : Independent/Modified Independent                     Extremity/Trunk Assessment   Upper Extremity Assessment Upper Extremity Assessment: Defer to OT evaluation    Lower Extremity Assessment Lower Extremity Assessment: Generalized weakness       Communication   Communication Communication: No apparent difficulties    Cognition Arousal: Alert Behavior During Therapy: WFL for tasks assessed/performed   PT - Cognitive impairments: No family/caregiver present to determine baseline                         Following commands: Intact       Cueing Cueing Techniques: Verbal cues     General Comments General comments (skin integrity, edema, etc.): VSS on RA    Exercises     Assessment/Plan    PT Assessment Patient needs continued PT services  PT Problem List Decreased strength;Decreased activity tolerance;Decreased balance;Decreased mobility       PT Treatment Interventions DME instruction;Gait training;Stair training;Functional mobility training;Therapeutic activities;Therapeutic exercise;Balance training;Patient/family education    PT Goals (Current goals can be found in the Care Plan section)  Acute Rehab PT Goals Patient Stated Goal: go to sister's home to recover PT Goal Formulation: With patient Time For Goal Achievement: 01/11/24 Potential to Achieve Goals: Good    Frequency Min 2X/week     Co-evaluation               AM-PAC PT 6 Clicks Mobility  Outcome Measure Help needed turning from your back to your side while in a flat bed without using bedrails?: A Little Help needed moving from lying on your back to sitting on the side of a flat bed without using  bedrails?: A Lot Help needed moving to and from a bed to a chair (including a wheelchair)?: A Little Help needed standing up from a chair using your arms (e.g., wheelchair or bedside chair)?: A Little Help needed to walk in hospital room?: A Little Help needed climbing 3-5 steps with a railing? : A Lot 6 Click Score: 16    End of Session Equipment Utilized During Treatment: Gait belt Activity Tolerance: Patient tolerated treatment well Patient left: in chair;with call bell/phone within reach;with chair alarm set Nurse Communication: Mobility status PT Visit Diagnosis: Unsteadiness on feet (R26.81);Other abnormalities of gait and mobility (R26.89);Muscle weakness (generalized) (M62.81)    Time: 8758-8742 PT Time Calculation (min) (ACUTE ONLY): 16 min   Charges:   PT Evaluation $PT Eval Moderate Complexity: 1 Mod   PT General Charges $$ ACUTE PT VISIT: 1 Visit         [facility] PT Acute Rehabilitation Services Office [phone]   Rodgers ORN [facility] 12/28/2023, 1:57 PM

## 2023-12-28 NOTE — Progress Notes (Addendum)
 Pharmacy Antibiotic Note  Glenn Stewart is a 58 y.o. male admitted on 12/16/2023 with MSSA renal abscess. Pharmacy has been consulted for Cefazolin  dosing.  The patient is noted to have AKI with improving UOP - last IHD was 10/7 (1.5 hr BFR 300). Will monitor UOP and SCr trends closely for dose adjustments - low threshold to increase to 2g/12h.   Plan: - Start Cefazolin  1g IV every 12 hours for now and metronidazole  500 mg po every 12 hours - Will monitor renal function, plans for IHD, and dispo plans  Height: 5' 5 (165.1 cm) Weight: 93.4 kg (205 lb 14.6 oz) IBW/kg (Calculated) : 61.5  Temp (24hrs), Avg:98 F (36.7 C), Min:97.5 F (36.4 C), Max:98.6 F (37 C)  Recent Labs  Lab 12/22/23 0359 12/22/23 1527 12/23/23 0638 12/24/23 0512 12/25/23 0630 12/26/23 0316 12/27/23 0813 12/28/23 0232  WBC 13.7*  --  11.0*  --   --   --   --  11.0*  CREATININE 4.29*   < > 4.25* 5.92* 5.56* 6.71* 7.44* 6.26*   < > = values in this interval not displayed.    Estimated Creatinine Clearance: 13.4 mL/min (A) (by C-G formula based on SCr of 6.26 mg/dL (H)).    Allergies  Allergen Reactions   Sulfonamide Derivatives Hives    Antimicrobials this admission: Vancomycin  9/26 x 1 Zosyn  9/26 >> 9/29 Linezolid  9/27 >> 9/29 Cefepime 9/29 >> 9/30 Metronidazole  9/29 >> 9/30 Meropenem 10/1 >> 10/7 Cefazolin  10/8 >>  Dose adjustments this admission: N/a  Microbiology results: 9/26 UCx >> 80k Enterobacter cloacae 9/26 BCx >> NGf 9/27 Renal abscess >> MSSA 10/1 MRSA PCR >> neg 10/1 RCx (TA) >> candida dublinesis 10/1 BCx >> NGf  Thank you for allowing pharmacy to be a part of this patient's care.  Almarie Lunger, PharmD, BCPS, BCIDP Infectious Diseases Clinical Pharmacist 12/28/2023 4:41 PM   **Pharmacist phone directory can now be found on [email or web address] ([email or web address]).  Listed under [facility].

## 2023-12-28 NOTE — Progress Notes (Addendum)
 Pt's referral to [facility] for out-pt HD is currently pending. Will provide update to pt and team as information is received.   Randine Mungo Dialysis Navigator [phone]  Addendum at 1:53 pm: Contacted by CSW to say that pt's d/c plan is still TBD. Pt may be going to [facility] at d/c. Contacted [facility] admissions to request that pt's referral be paused for clinic placement locally due to possibility of pt needing a clinic in [place]. Will assist as needed.   Addendum at 2:49 pm: Contacted by team to say pt's d/c plan will be for pt to go stay with pt's mother at d/c. Spoke to pt's sister, Lucian, via phone. Sister confirms this plan and confirms pt's mother's address is [address], [place], [place] [address]. Discussed out-pt HD options with sister. At this time, sister prefers a [facility] clinic in [place] be contacted for pt's HD needs. This will allow pt to transfer back to a [facility] clinic close to pt's home when pt returns home if HD still needed. Contacted [facility] admissions to request that pt's referral be diverted to [facility] for review. Will await determination from clinic.

## 2023-12-28 NOTE — Progress Notes (Signed)
 Admit: 12/16/2023 LOS: 12  66M with AKI after presenting with encephalopathy, A-fib with RVR/ventricular tachycardia, found to have likely abdominal urinomas with abscess, now requiring RRT TTS with some signs of renal recovery.    Subjective:  UOP 2900 mL. Had [facility] placed today, L temp HD cath being removed by IV team when I saw him No new issues    10/07 0701 - 10/08 0700 In: 570 [P.O.:360; IV Piggyback:200] Out: 3575 [Urine:2900; Drains:225; Stool:450]  Filed Weights   12/24/23 1215 12/27/23 0515 12/27/23 1917  Weight: 90 kg 93.4 kg 93.4 kg    Scheduled Meds:  (feeding supplement) PROSource Plus  30 mL Oral TID AC   amLODipine   5 mg Oral Daily   atorvastatin   80 mg Oral Daily   bisacodyl   10 mg Rectal Daily   carvedilol  12.5 mg Oral BID WC   Chlorhexidine  Gluconate Cloth  6 each Topical Daily   feeding supplement  237 mL Oral BID BM   Gerhardt's butt cream   Topical BID   heparin  injection (subcutaneous)  5,000 Units Subcutaneous Q8H   leptospermum manuka honey  1 Application Topical Daily   multivitamin  1 tablet Oral QHS   sevelamer carbonate  1,600 mg Oral TID WC   sodium chloride  flush  10-40 mL Intracatheter Q12H   sodium chloride  flush  5 mL Intracatheter Q8H   thiamine  100 mg Oral Daily   Continuous Infusions:  meropenem (MERREM) IV Stopped (12/27/23 2212)   PRN Meds:.mouth rinse, sodium chloride  flush  Current Labs: reviewed    Physical Exam:  Blood pressure (!) 155/87, pulse 71, temperature 97.8 F (36.6 C), resp. rate 18, height 5' 5 (1.651 m), weight 93.4 kg, SpO2 99%.  GEN: NAD, on RA Neck: R TDC c/d/i CV: Regular, normal S1 and S2 PULM: BLBS ABD: soft EXT: no LEE  A AKI, anuric, baseline normal GFR.  No evidence of obstruction on imaging at presentation.  Suspect ATN.  Repeat bladder pressure not consistent with abdominal compartment syndrome on 9/27.  CT A/P w contrast, 10/1: Renal unremarkable. Now nonoliguric.  Hyponatremia,  mild Abdominal fluid collections concerning for urinoma after radical prostatectomy 11/14/2023; status post drain placement 9/27 by IR with more than 1 L output since placement Ileus versus SBO, surgery following, ileus favored and now resolved VDRF resolved A-fib with RVR and V. tach at presentation, cardiology following on amiodarone  Septic shock on linezolid  and Zosyn , Off pressors, improving Hypertension: On amlodipine  5 daily, Lopressor  25 BID.  BP somewhat labile but generally ok Hyperphosphatemia setting of AKI Metabolic acidosis, improving on HD. Anemia: mild Hb 10s   P Stopped CRRT 9/30.  1st HD, 10/1, last 10/7, next 10/9 if needed Cont daily labs UOP assessment - looks like he's recovering  outpt AKI HD CLIP underway while we concurrently monitor for renal recovery Start phos binder renvela 2 TIDAC for phos 11, renal diet Daily weights, Daily Renal Panel, Strict I/Os, Avoid nephrotoxins (NSAIDs, judicious IV Contrast)  Medication Issues; Preferred narcotic agents for pain control are hydromorphone , fentanyl , and methadone. Morphine  should not be used.  Baclofen should be avoided Avoid oral sodium phosphate  and magnesium  citrate based laxatives / bowel preps     Dr. Manuelita DELENA Barters  12/28/2023, 12:17 PM  Recent Labs  Lab 12/25/23 0630 12/26/23 0316 12/27/23 0813 12/28/23 0232  NA 135 133* 130* 130*  K 4.1 4.5 4.7 5.1  CL 95* 95* 95* 92*  CO2 23 20* 17* 18*  GLUCOSE 113*  99 122* 126*  BUN 89* 114* 143* 125*  CREATININE 5.56* 6.71* 7.44* 6.26*  CALCIUM  7.8* 8.0* 7.8* 7.4*  PHOS 6.4* 8.9* 11.4*  --    Recent Labs  Lab 12/22/23 0359 12/23/23 0638 12/23/23 1129 12/28/23 0232  WBC 13.7* 11.0*  --  11.0*  NEUTROABS 10.7*  --   --   --   HGB 10.6* 11.3* 10.5* 10.8*  HCT 31.8* 35.1* 31.0* 33.3*  MCV 80.1 81.4  --  82.4  PLT 338 359  --  411*

## 2023-12-28 NOTE — Evaluation (Signed)
 Occupational Therapy Evaluation Patient Details Name: Glenn Stewart MRN: [id number] DOB: [birth year] Today's Date: 12/28/2023   History of Present Illness   Pt is 58 year old presented to [facility] on  12/16/23 for weakness and tender abdomen. Pt with afib with rvr and vtach and underwent emergent cardioversion. Post cardioversion hypotension and resp distress and intubated. Pt with shock and AKI and intraabdominal fluid collections concerning for urinoma. Abdominal drain placement on 9/27. Pt started on CRRT and transitioned to HD on 10/1. AbPMH - 8/28 prostatectomy, htn, CABG, afib, NASH, bipolar, ADHD     Clinical Impressions Pt ind at baseline with ADL/functional mobility was working and driving PTA, pt plans to d/c to his mom's home in [place]. Pt currently with impaired cognition, needs up to minA for ADLs, mod A for bed mobility (due to limited use of UE from HD cath placement. minA for transfers with RW. Pt with mild impulsivity and decr attention, memory noted during session. Pt presenting with impairments listed below, will follow acutely. Recommend HHOT at d/c.      If plan is discharge home, recommend the following:   A little help with walking and/or transfers;A little help with bathing/dressing/bathroom;Assistance with cooking/housework;Assist for transportation;Help with stairs or ramp for entrance;Supervision due to cognitive status     Functional Status Assessment   Patient has had a recent decline in their functional status and demonstrates the ability to make significant improvements in function in a reasonable and predictable amount of time.     Equipment Recommendations   Other (comment);Tub/shower seat (RW)     Recommendations for Other Services   PT consult     Precautions/Restrictions   Precautions Precautions: Fall;Other (comment) Precaution/Restrictions Comments: Abdominal drain Restrictions Weight Bearing Restrictions Per Provider Order: No      Mobility Bed Mobility Overal bed mobility: Needs Assistance Bed Mobility: Supine to Sit     Supine to sit: Mod assist     General bed mobility comments: Assist to elevate trunk primarily due to pt with recent insertion of HD cath and removal of temporary cath and he isn't to pull with UE's    Transfers Overall transfer level: Needs assistance Equipment used: Rolling walker (2 wheels) Transfers: Sit to/from Stand Sit to Stand: Min assist           General transfer comment: Assist to power up and stabilize      Balance Overall balance assessment: Needs assistance Sitting-balance support: No upper extremity supported, Feet supported Sitting balance-Leahy Scale: Fair     Standing balance support: Bilateral upper extremity supported, During functional activity Standing balance-Leahy Scale: Poor Standing balance comment: walker and CGA for static standing                           ADL either performed or assessed with clinical judgement   ADL Overall ADL's : Needs assistance/impaired Eating/Feeding: Set up;Sitting   Grooming: Set up;Sitting   Upper Body Bathing: Minimal assistance;Sitting   Lower Body Bathing: Minimal assistance;Sitting/lateral leans   Upper Body Dressing : Minimal assistance;Sitting   Lower Body Dressing: Minimal assistance;Sitting/lateral leans   Toilet Transfer: Contact guard assist;Ambulation;Rolling walker (2 wheels)   Toileting- Clothing Manipulation and Hygiene: Contact guard assist       Functional mobility during ADLs: Minimal assistance;Rolling walker (2 wheels)       Vision   Vision Assessment?: No apparent visual deficits     Perception Perception: Not tested  Praxis Praxis: Not tested       Pertinent Vitals/Pain Pain Assessment Pain Assessment: No/denies pain     Extremity/Trunk Assessment Upper Extremity Assessment Upper Extremity Assessment: Generalized weakness   Lower Extremity  Assessment Lower Extremity Assessment: Defer to PT evaluation   Cervical / Trunk Assessment Cervical / Trunk Assessment: Normal   Communication Communication Communication: No apparent difficulties   Cognition Arousal: Alert Behavior During Therapy: WFL for tasks assessed/performed Cognition: Cognition impaired       Memory impairment (select all impairments): Short-term memory Attention impairment (select first level of impairment): Sustained attention   OT - Cognition Comments: x3 cues to not to empty urinary catheter during session, follows single step instructions with reminder                 Following commands: Intact       Cueing  General Comments   Cueing Techniques: Verbal cues  VSS on RA   Exercises     Shoulder Instructions      Home Living Family/patient expects to be discharged to:: Private residence Living Arrangements: Parent Available Help at Discharge: Family;Available PRN/intermittently Type of Home: House Home Access: Level entry Entrance Stairs-Number of Steps: 3   Home Layout: One level     Bathroom Shower/Tub: Tub/shower unit;Walk-in shower         Home Equipment: Agricultural consultant (2 wheels)   Additional Comments: if has to have HD then will stay with sister      Prior Functioning/Environment Prior Level of Function : Independent/Modified Independent;Driving;Working/employed                    OT Problem List: Decreased strength;Decreased range of motion;Decreased activity tolerance;Impaired balance (sitting and/or standing);Decreased cognition;Decreased safety awareness   OT Treatment/Interventions: Self-care/ADL training;Therapeutic exercise;Energy conservation;DME and/or AE instruction;Therapeutic activities;Patient/family education;Balance training      OT Goals(Current goals can be found in the care plan section)   Acute Rehab OT Goals Patient Stated Goal: none stated OT Goal Formulation: With patient Time  For Goal Achievement: 01/11/24 Potential to Achieve Goals: Good ADL Goals Pt Will Perform Upper Body Dressing: Independently;sitting Pt Will Perform Lower Body Dressing: Independently;sitting/lateral leans;sit to/from stand Pt Will Transfer to Toilet: Independently;regular height toilet;ambulating Pt Will Perform Tub/Shower Transfer: Tub transfer;Shower transfer;Independently;ambulating Additional ADL Goal #1: pt will recieve passing score on higher level cognitive assessment in prep for IADLs   OT Frequency:  Min 2X/week    Co-evaluation              AM-PAC OT 6 Clicks Daily Activity     Outcome Measure Help from another person eating meals?: A Little Help from another person taking care of personal grooming?: A Little Help from another person toileting, which includes using toliet, bedpan, or urinal?: A Little Help from another person bathing (including washing, rinsing, drying)?: A Little Help from another person to put on and taking off regular upper body clothing?: A Little Help from another person to put on and taking off regular lower body clothing?: A Little 6 Click Score: 18   End of Session Equipment Utilized During Treatment: Rolling walker (2 wheels) Nurse Communication: Mobility status (bed alarm does not set)  Activity Tolerance: Patient tolerated treatment well Patient left: in bed;with call bell/phone within reach;with family/visitor present  OT Visit Diagnosis: Unsteadiness on feet (R26.81);Other abnormalities of gait and mobility (R26.89);Muscle weakness (generalized) (M62.81)                Time: 8467-8389  OT Time Calculation (min): 38 min Charges:  OT General Charges $OT Visit: 1 Visit OT Evaluation $OT Eval Moderate Complexity: 1 Mod OT Treatments $Self Care/Home Management : 8-22 mins $Therapeutic Activity: 8-22 mins  Jarmel Linhardt K, OTD, OTR/L SecureChat Preferred Acute Rehab [phone]   Laneta POUR Koonce 12/28/2023, 4:31 PM

## 2023-12-28 NOTE — Progress Notes (Signed)
 Notified by Dr. Laurence that he needs his temporary HD catheter removed. Order written and I have requested he contact IV team to discontinue this line.   Leita SHAUNNA Gaskins, DO 12/28/23 10:40 AM  Pulmonary & Critical Care  For contact information, see Amion. If no response to pager, please call PCCM consult pager. After hours, 7PM- 7AM, please call Elink.

## 2023-12-29 DIAGNOSIS — I2510 Atherosclerotic heart disease of native coronary artery without angina pectoris: Secondary | ICD-10-CM | POA: Diagnosis not present

## 2023-12-29 DIAGNOSIS — L89300 Pressure ulcer of unspecified buttock, unstageable: Secondary | ICD-10-CM

## 2023-12-29 DIAGNOSIS — I10 Essential (primary) hypertension: Secondary | ICD-10-CM | POA: Diagnosis not present

## 2023-12-29 DIAGNOSIS — K651 Peritoneal abscess: Secondary | ICD-10-CM | POA: Diagnosis not present

## 2023-12-29 DIAGNOSIS — N179 Acute kidney failure, unspecified: Secondary | ICD-10-CM | POA: Diagnosis not present

## 2023-12-29 LAB — RENAL FUNCTION PANEL
Albumin: 2 g/dL — ABNORMAL LOW (ref 3.5–5.0)
Anion gap: 20 — ABNORMAL HIGH (ref 5–15)
BUN: 135 mg/dL — ABNORMAL HIGH (ref 6–20)
CO2: 17 mmol/L — ABNORMAL LOW (ref 22–32)
Calcium: 7.7 mg/dL — ABNORMAL LOW (ref 8.9–10.3)
Chloride: 92 mmol/L — ABNORMAL LOW (ref 98–111)
Creatinine, Ser: 6.28 mg/dL — ABNORMAL HIGH (ref 0.61–1.24)
GFR, Estimated: 10 mL/min — ABNORMAL LOW (ref >60)
Glucose, Bld: 111 mg/dL — ABNORMAL HIGH (ref 70–99)
Phosphorus: 10.2 mg/dL — ABNORMAL HIGH (ref 2.5–4.6)
Potassium: 4.2 mmol/L (ref 3.5–5.1)
Sodium: 129 mmol/L — ABNORMAL LOW (ref 135–145)

## 2023-12-29 LAB — GLUCOSE, CAPILLARY
Glucose-Capillary: 113 mg/dL — ABNORMAL HIGH (ref 70–99)
Glucose-Capillary: 115 mg/dL — ABNORMAL HIGH (ref 70–99)
Glucose-Capillary: 144 mg/dL — ABNORMAL HIGH (ref 70–99)
Glucose-Capillary: 146 mg/dL — ABNORMAL HIGH (ref 70–99)
Glucose-Capillary: 148 mg/dL — ABNORMAL HIGH (ref 70–99)

## 2023-12-29 MED ORDER — ALPRAZOLAM 0.5 MG PO TABS
0.5000 mg | ORAL_TABLET | Freq: Every evening | ORAL | Status: DC | PRN
Start: 2023-12-29 — End: 2024-01-02
  Administered 2023-12-29 – 2024-01-01 (×2): 0.5 mg via ORAL
  Filled 2023-12-29 (×2): qty 1

## 2023-12-29 NOTE — Progress Notes (Signed)
 Admit: 12/16/2023 LOS: 13  41M with AKI after presenting with encephalopathy, A-fib with RVR/ventricular tachycardia, found to have likely abdominal urinomas with abscess, now requiring RRT TTS with some signs of renal recovery.    Subjective:  UOP 3400 mL but clearance lags - for HD today No new issues in good spirits    10/08 0701 - 10/09 0700 In: 265 [P.O.:240; I.V.:20] Out: 3610 [Urine:3400; Drains:210]  Filed Weights   12/24/23 1215 12/27/23 0515 12/27/23 1917  Weight: 90 kg 93.4 kg 93.4 kg    Scheduled Meds:  (feeding supplement) PROSource Plus  30 mL Oral TID AC   amLODipine   5 mg Oral Daily   atorvastatin   80 mg Oral Daily   bisacodyl   10 mg Rectal Daily   carvedilol  12.5 mg Oral BID WC   Chlorhexidine  Gluconate Cloth  6 each Topical Daily   feeding supplement  237 mL Oral BID BM   Gerhardt's butt cream   Topical BID   heparin  injection (subcutaneous)  5,000 Units Subcutaneous Q8H   leptospermum manuka honey  1 Application Topical Daily   metroNIDAZOLE   500 mg Oral Q12H   multivitamin  1 tablet Oral QHS   sevelamer carbonate  1,600 mg Oral TID WC   sodium chloride  flush  10-40 mL Intracatheter Q12H   sodium chloride  flush  5 mL Intracatheter Q8H   thiamine  100 mg Oral Daily   Continuous Infusions:   ceFAZolin  (ANCEF ) IV     PRN Meds:.mouth rinse, sodium chloride  flush  Current Labs: reviewed    Physical Exam:  Blood pressure (!) 149/88, pulse 92, temperature (!) 97.5 F (36.4 C), temperature source Oral, resp. rate 17, height 5' 5 (1.651 m), weight 93.4 kg, SpO2 99%.  GEN: NAD, on RA Neck: R TDC c/d/I - small dried blood at exit site not bad CV: Regular, normal S1 and S2 PULM: BLBS ABD: soft EXT: no LEE   A AKI, anuric, baseline normal GFR.  No evidence of obstruction on imaging at presentation.  Suspect ATN.  Repeat bladder pressure not consistent with abdominal compartment syndrome on 9/27.  CT A/P w contrast, 10/1: Renal unremarkable. Now  nonoliguric.  Hyponatremia, mild Abdominal fluid collections concerning for urinoma after radical prostatectomy 11/14/2023; status post drain placement 9/27 by IR with more than 1 L output since placement Ileus versus SBO, surgery following, ileus favored and now resolved VDRF resolved A-fib with RVR and V. tach at presentation, cardiology following on amiodarone  Septic shock on linezolid  and Zosyn , Off pressors, improving Hypertension: On amlodipine  5 daily, Lopressor  25 BID.  BP somewhat labile but generally ok Hyperphosphatemia setting of AKI on phos binder Metabolic acidosis, improving on HD. Anemia: mild Hb 10s   P Stopped CRRT 9/30.  1st HD, 10/1, last 10/7, next 10/9 Cont daily labs UOP assessment - looks like he's recovering but clearance lagging Outpatient AKI HD CLIP underway while we concurrently monitor for renal recovery - d/c planning complex as may go to [facility] vs home but SW aware/working through it Daily weights, Daily Renal Panel, Strict I/Os, Avoid nephrotoxins (NSAIDs, judicious IV Contrast)  Medication Issues; Preferred narcotic agents for pain control are hydromorphone , fentanyl , and methadone. Morphine  should not be used.  Baclofen should be avoided Avoid oral sodium phosphate  and magnesium  citrate based laxatives / bowel preps     Dr. Manuelita DELENA Barters  12/29/2023, 8:08 AM  Recent Labs  Lab 12/26/23 0316 12/27/23 0813 12/28/23 0232 12/29/23 0221  NA 133* 130* 130* 129*  K 4.5 4.7 5.1 4.2  CL 95* 95* 92* 92*  CO2 20* 17* 18* 17*  GLUCOSE 99 122* 126* 111*  BUN 114* 143* 125* 135*  CREATININE 6.71* 7.44* 6.26* 6.28*  CALCIUM  8.0* 7.8* 7.4* 7.7*  PHOS 8.9* 11.4*  --  10.2*   Recent Labs  Lab 12/23/23 0638 12/23/23 1129 12/28/23 0232  WBC 11.0*  --  11.0*  HGB 11.3* 10.5* 10.8*  HCT 35.1* 31.0* 33.3*  MCV 81.4  --  82.4  PLT 359  --  411*

## 2023-12-29 NOTE — Progress Notes (Signed)
 PROGRESS NOTE    Glenn Stewart  FMW:[id number] DOB: [birth year] DOA: 12/16/2023 PCP: Pcp, No  Subjective: Pt seen and examined. Left internal jugular HD catheter removed yesterday. Pt states urology going to take his foley out today. Had HD today.  Pt now states he is going to live in [place] with his mother. Not going home locally to his sister's house like he said yesterday.    Hospital Course: CC: Pt was at urology appt and was in and out of consciousness. Severe abd pain on right side. Abd distention. Pt arrives in afib RVR. Pts O2 was in 60's. BP 140s.   HPI: 58 yoM with PMH of HTN, CAD, CABG x 5 04/2022 with post op Afib since stopped on Eliquis  and amiodarone  09/2022, HTN, HLD, GERD, NASH, bipolar/ ADHD and s/p recent robotic assisted laparoscopic radial prostatectomy on 11/17/23 who presented for weakness and waxing mental status with distended and tender abdominal by EMS found with episodes of Afib with RVR and VT.  Was hemodynamically unstable on arrival, underwent emergent cardioversion and given amiodarone  x2 and magnesium , with emerging rhythm of afib with RVR possible flutter.  Cardiology consulted. Post cardioversion became hypotensive with respiratory distress therefore intubated and placed on norepinephrine .  Difficulty oxygenating post intubation with normal PIP requiring 1.0 and 12 PEEP.  Labs noted for K2.8, Na 129, bicarb 16, BUN/ sCr 55/ 3.92 (recently  ), t. Bili 3.4, AST 68, albumin  1.6, protein 3.4, INR 1.2.  CTH neg, CTA PE and CT a/p w/contrast pending official read.  2.1L of brownish OGT contents s/p OGT placement. Cultures sent, empiric zosyn / vanc started.  Afebrile, anuric and since weaned off pressors.  R internal jugular CVL placed by EDP.  PCCM consulted for admission.     patient self-irrigated catheter a couple days after initial surgery and then removed catheter himself prior to follow-up at 1 week. Patient reported that he cut tubing prior to removal but  unable to confirm if removal was truly atraumat   Significant Events: Admitted 12/16/2023 to ICU by PCCM for septic shock, respiratory failure with hypoxia, intra-abdominal abscesses 12-16-2023 intubated in ER. Emergently cardioverted due to sustained ventricular tachycardia. Due to hypotension, started on IV levophed  09/27 s/p IR drainage of RLQ/pelvic collection (Cx abundant staph); 300 mL  9/28 - On vent. Ongoing CRRT. Seen by surgery team - still concernd about Urinoma and secondary bowel irritation and not a true obstruction. NGT output is low c/w ileus. CCS does  NOT want enteral medication. Recommnding max only trophic feds. CUlture negative. Afebrile.  Stopped on Zyvox  and Zosyn  9/29 SBO Xr protocol per Gen Surgery with oral contrast pooling in the gastric fundus. MSSA in abdominal cultures and Enterobacter cloacae in Ucx - on Cefepime and Flagyl  SBT trial failed secondary to RR >40 and increased WOB. Back on PRVC 9/30 discontinued CRRT.  10/1 - switched to meropenem. Repeat CT A/P with stable intraabdominal abscesses. Started iHD 10/03 liberated from mechanical ventilation.  10/05 transfer to [facility].  10/06 plan for HD tunneled catheter  10/97 rectal tube has been removed.  Admission Labs: Na 129, K 2.8, CO2 of 16, BUN 55, Scr 3.92, glu 118, Ca 6.3 T. Prot 5.1, alb 1.6, AST 68, ALT 37, alk phos 69, t. Bili 3.4,  WBC 6.0, HgB 12.6, Plt 324 Triglycerides 145 Urine drug screen negative UA cloudy, moderate HgB, negative nitrite, Large LE, WBC >50, rare bacteria ABG pH 7.219, PCO2 of 54, PO2 of 74  Admission Imaging Studies: CXR  Well-positioned endotracheal and esophagogastric tubes. No pneumothorax. 2. Markedly low lung volumes.  No pneumonia or pleural effusion. CT head No acute intracranial pathology.  CTPA, CT abd/pelvis No evidence of significant pulmonary embolus. Fluid-filled distended small bowel with decompressed terminal ileum consistent with small-bowel obstruction. 2.  Irregular wall thickening diffusely throughout the distal small bowel and right colon suggesting enterocolitis or inflammatory bowel disease. Inflammatory strictures may be present. 3. Multiple loculated fluid collections, some with gas, demonstrated in the right abdomen and pelvis indicating abscesses. This is likely due to perforation, possibly related to inflammatory bowel disease or to an occult perforated appendicitis. The appendix is self is not demonstrated. No free intra-abdominal air. 4. Small amount of free fluid in the abdomen is likely reactive. 5. Cholelithiasis without evidence of acute cholecystitis. 6. Aortic atherosclerosis. 7. Consolidation versus compressive atelectasis in both lung bases. Small bilateral pleural effusions. CT cystogram CT cystogram with Foley catheter in place. Contrast material leaks along the catheter through the urethra. There is focal irregularity at the junction of the bladder base and urethra with small anterior contrast pooling suggesting contained perforation. No intraperitoneal contrast extravasation. 2. Appearance examination is otherwise unchanged. Again demonstrated are multiple loculated fluid collections in the abdomen and pelvis consistent with abscesses. Wall thickening of the cecum and terminal ileum. Fluid-filled dilated small bowel appear mildly improved.  Significant Labs: 12-16-2023 urine cx enterobacter cloacae 12-17-2023 abscess culture. MSSA Staph aureus  Significant Imaging Studies:   Antibiotic Therapy: Anti-infectives (From admission, onward)    Start     Dose/Rate Route Frequency Ordered Stop   12/21/23 1600  meropenem (MERREM) 1 g in sodium chloride  0.9 % 100 mL IVPB        1 g 200 mL/hr over 30 Minutes Intravenous Every 24 hours 12/21/23 0853 12/28/23 2359   12/19/23 1045  ceFEPIme (MAXIPIME) 2 g in sodium chloride  0.9 % 100 mL IVPB  Status:  Discontinued        2 g 200 mL/hr over 30 Minutes Intravenous Every 12 hours 12/19/23 0955  12/21/23 0843   12/19/23 1045  metroNIDAZOLE  (FLAGYL ) IVPB 500 mg  Status:  Discontinued        500 mg 100 mL/hr over 60 Minutes Intravenous Every 12 hours 12/19/23 0955 12/21/23 0843   12/17/23 1600  piperacillin -tazobactam (ZOSYN ) IVPB 2.25 g  Status:  Discontinued        2.25 g 100 mL/hr over 30 Minutes Intravenous Every 8 hours 12/17/23 0931 12/17/23 1144   12/17/23 1600  piperacillin -tazobactam (ZOSYN ) IVPB 3.375 g  Status:  Discontinued        3.375 g 100 mL/hr over 30 Minutes Intravenous Every 6 hours 12/17/23 1157 12/19/23 0955   12/17/23 1230  piperacillin -tazobactam (ZOSYN ) IVPB 3.375 g  Status:  Discontinued        3.375 g 100 mL/hr over 30 Minutes Intravenous Every 6 hours 12/17/23 1144 12/17/23 1157   12/17/23 1000  linezolid  (ZYVOX ) IVPB 600 mg  Status:  Discontinued        600 mg 300 mL/hr over 60 Minutes Intravenous Every 12 hours 12/16/23 1924 12/19/23 0958   12/17/23 0000  piperacillin -tazobactam (ZOSYN ) IVPB 3.375 g  Status:  Discontinued        3.375 g 12.5 mL/hr over 240 Minutes Intravenous Every 8 hours 12/16/23 1924 12/17/23 0931   12/16/23 1600  piperacillin -tazobactam (ZOSYN ) IVPB 3.375 g        3.375 g 12.5 mL/hr over 240 Minutes Intravenous  Once 12/16/23 1551 12/16/23 1602  12/16/23 1600  vancomycin  (VANCOREADY) IVPB 1750 mg/350 mL        1,750 mg 175 mL/hr over 120 Minutes Intravenous  Once 12/16/23 1551 12/16/23 1816       Procedures: 12-16-2023 intubation, cardioversion, Right internal jugular CVL, arterial line insertion 12-17-2023 IR drain placement into RLQ abd abscess  Consultants: PCCM Cardiology Urology General surgery ID    Assessment and Plan: * Intra-abdominal abscess (HCC) Prior to 12/28/23 Septic shock resolved.  Multiple abdominal abscesses due to MSSA.  Urinary tract infection due to enterobacter cloacae.  Initially placed on metronidazole  and cefepime, then because persistent fever has been changed to meropenem.  Follow up  CT abdomen and pelvis with stable or improvement in abdominal abscess.   Follow up cell count is 11.0  Cultures from blood have been no growth.   Plan to continue antibiotic therapy with meropenem (started on 10/01).  Will plan for now to continue antibiotic therapy until 10/08   12/28/23 pt has completed 10 days of Cefepime/Meropenem for enterobacter cloacae abscess. Asking ID if he needs more IV/PO abx. Asking urology for input about his intra-abd drains that were placed by IR.  General surgery signed off on 12-26-2023.  12/29/23 ID consulted yesterday to come up with a plan for abx therapy.  Still has abd drain. Pt states urology going to remove his foley catheter today.  Acute renal failure on dialysis Prior to 12/28/23  ATN. Hyponatremia hyperkalemia.  09/30 Stopped CRRT  10/01 started intermittent hemodialysis.   Urine output is 2250 cc (foley in place)  BUN 143, cr 7,44, with K at 4.7 and serum bicarbonate at 17  Na 130   Plan for tunneled HD access.    Patient continue renal replacement therapy with hemodialysis, per nephrology recommendations   12/29/23 remains on HD. Showing some signs of renal recovery. Produced 3.4L of urine. He now has right internal jugular permcath. Renal coordinator trying to figure out where pt is going to get outpt HD if he goes to [place] and lives with his mother.   VT (ventricular tachycardia) (HCC)-resolved as of 12/28/2023 Prior to 12/28/23 Ventricular tachycardia sp cardioversion.   Continue with carvedilol 12.5 mg po bid.  Patient not on anticoagulation. Continue telemetry monitoring EP has been consulted with recommendations to stop amiodarone  and continue close monitoring.   12/28/23 cards/EP felt that pt's VT was due to his sepsis. Amio stopped. On coreg 12.5 mg bid. Ziopatch ordered to be placed prior to discharge.   Class 2 obesity with body mass index (BMI) of 35 to 39.9 without comorbidity Body mass index is 34.27  kg/m.   Prostate cancer [facility]) Prior to 12/28/23 Sp robotic assisted laparoscopic prostatectomy on 11/17/23 Continue foley catheter, plan to repeat cystogram 10/7 to 10/10 per urology.   12/28/23 urology following  Coronary artery disease 12/28/23 No chest pain, no acute coronary syndrome.   12/29/23 stable.   Essential hypertension 12/28/23 Continue blood pressure control with amlodipine  and carvedilol.   12/29/23 stable on norvasc  and coreg.   Atrial fibrillation with RVR (HCC)-resolved as of 12/28/2023  >>ASSESSMENT AND PLAN FOR PAF (PAROXYSMAL ATRIAL FIBRILLATION) (HCC) WRITTEN ON 12/28/2023  1:46 PM BY Vernadine Coombs, DO  Prior to 12/28/23 Ventricular tachycardia sp cardioversion.   Continue with carvedilol 12.5 mg po bid.  Patient not on anticoagulation. Continue telemetry monitoring EP has been consulted with recommendations to stop amiodarone  and continue close monitoring.   12/28/23 cards/EP felt that pt's VT was due to his sepsis. Amio stopped.  On coreg 12.5 mg bid. Ziopatch ordered to be placed prior to discharge.   Bilateral pneumonia-resolved as of 12/28/2023 Prior to 12/28/23 (Present on admission)  Acute hypoxemic and hypercapnic respiratory failure.  Moderate ARDS.  Required invasive mechanical ventilation.   Completed antibiotic therapy 02 saturation today is 100% on room air.   12/28/23 resolved.   Ileus (HCC)-resolved as of 12/28/2023 Prior to 12/28/23 Patient has been weaned off TPN and now tolerating po well.  Ileus has resolved, advanced to soft today.  Surgery has signed off 12-26-2023  Acute metabolic encephalopathy-resolved as of 12/28/2023 Prior to 12/28/23 Mentation has been improving, continue with mild confusion but not agitation. Continue nutritional support, PT and OT.  Neuro checks per unit protocol.   12/28/23 resolved.   Pressure injury of buttock, unstageable (HCC) 12/28/23 Noted by wound care care on 12-26-2023. Previously  noted Deep tissue pressure injury is beginning to evolve into an Unstageable pressure injury with areas of slough to the edges across bilat buttocks/sacrum, approx 6X6 cm. Was not present on admission.   DVT prophylaxis: heparin  injection 5,000 Units Start: 12/17/23 1400 SCDs Start: 12/16/23 1727    Code Status: Full Code Family Communication: no family at bedside. Pt's friend at bedside Disposition Plan: home. Pt states he is going to live with his mother in [place], [place] Reason for continuing need for hospitalization: nephrology coordinator trying to arrange for outpatient HD. Urology may remove foley. ID working on abx plan.  Objective: Vitals:   12/29/23 1200 12/29/23 1217 12/29/23 1219 12/29/23 1357  BP: 99/80 116/78 119/83 119/83  Pulse: (!) 109 92 98   Resp: (!) 26 (!) 27 (!) 25   Temp:  97.7 F (36.5 C) 97.7 F (36.5 C)   TempSrc:      SpO2:      Weight:   91.4 kg   Height:        Intake/Output Summary (Last 24 hours) at 12/29/2023 1458 Last data filed at 12/29/2023 1357 Gross per 24 hour  Intake 260 ml  Output 4950 ml  Net -4690 ml   Filed Weights   12/27/23 1917 12/29/23 0847 12/29/23 1219  Weight: 93.4 kg 93.4 kg 91.4 kg    Examination:  Physical Exam Vitals and nursing note reviewed.  Constitutional:      General: He is not in acute distress. HENT:     Head: Normocephalic and atraumatic.  Eyes:     General: No scleral icterus. Cardiovascular:     Rate and Rhythm: Normal rate and regular rhythm.  Pulmonary:     Effort: Pulmonary effort is normal.     Breath sounds: Normal breath sounds.  Abdominal:     General: Bowel sounds are normal.  Genitourinary:    Comments: +foley Skin:    General: Skin is warm and dry.     Capillary Refill: Capillary refill takes less than 2 seconds.     Comments: Right ant chest wall permcath  Neurological:     Mental Status: He is alert.     Data Reviewed: I have personally reviewed following labs and imaging  studies  CBC: Recent Labs  Lab 12/23/23 0638 12/23/23 1129 12/28/23 0232  WBC 11.0*  --  11.0*  HGB 11.3* 10.5* 10.8*  HCT 35.1* 31.0* 33.3*  MCV 81.4  --  82.4  PLT 359  --  411*   Basic Metabolic Panel: Recent Labs  Lab 12/23/23 9361 12/23/23 1129 12/24/23 0512 12/25/23 0630 12/26/23 9683 12/27/23 0813 12/28/23 0232 12/29/23 0221  NA 133*   < > 134* 135 133* 130* 130* 129*  K 4.4   < > 4.1 4.1 4.5 4.7 5.1 4.2  CL 95*  --  98 95* 95* 95* 92* 92*  CO2 23  --  19* 23 20* 17* 18* 17*  GLUCOSE 161*  --  202* 113* 99 122* 126* 111*  BUN 75*  --  113* 89* 114* 143* 125* 135*  CREATININE 4.25*  --  5.92* 5.56* 6.71* 7.44* 6.26* 6.28*  CALCIUM  7.7*  --  7.5* 7.8* 8.0* 7.8* 7.4* 7.7*  MG 2.4  --  2.4 2.2 2.4  --   --   --   PHOS 6.9*  --  6.4* 6.4* 8.9* 11.4*  --  10.2*   < > = values in this interval not displayed.   GFR: Estimated Creatinine Clearance: 13.2 mL/min (A) (by C-G formula based on SCr of 6.28 mg/dL (H)). Liver Function Tests: Recent Labs  Lab 12/24/23 0512 12/25/23 0630 12/26/23 0316 12/27/23 0813 12/29/23 0221  AST 32 30 39  --   --   ALT 20 20 27   --   --   ALKPHOS 90 82 92  --   --   BILITOT 0.7 0.9 1.1  --   --   PROT 6.0* 5.9* 6.6  --   --   ALBUMIN  1.6* 1.6* 1.9* 2.0* 2.0*   CBG: Recent Labs  Lab 12/28/23 1536 12/28/23 2041 12/29/23 0253 12/29/23 0541 12/29/23 1332  GLUCAP 137* 100* 113* 115* 144*   Recent Results (from the past 240 hours)  Culture, blood (Routine X 2) w Reflex to ID Panel     Status: None   Collection Time: 12/20/23 11:31 PM   Specimen: BLOOD LEFT HAND  Result Value Ref Range Status   Specimen Description BLOOD LEFT HAND  Final   Special Requests   Final    BOTTLES DRAWN AEROBIC AND ANAEROBIC Blood Culture adequate volume   Culture   Final    NO GROWTH 5 DAYS Performed at [facility], [address] [place], [place] [address]    Report Status 12/26/2023 FINAL  Final  Culture, blood (Routine X 2) w  Reflex to ID Panel     Status: None   Collection Time: 12/21/23 12:44 AM   Specimen: BLOOD LEFT HAND  Result Value Ref Range Status   Specimen Description BLOOD LEFT HAND  Final   Special Requests   Final    BOTTLES DRAWN AEROBIC AND ANAEROBIC Blood Culture adequate volume   Culture   Final    NO GROWTH 5 DAYS Performed at [facility], [address], [place], [place] [address]    Report Status 12/26/2023 FINAL  Final  Culture, Respiratory w Gram Stain     Status: None   Collection Time: 12/21/23  7:57 AM   Specimen: Tracheal Aspirate; Respiratory  Result Value Ref Range Status   Specimen Description TRACHEAL ASPIRATE  Final   Special Requests NONE  Final   Gram Stain   Final    FEW WBC PRESENT, PREDOMINANTLY PMN RARE YEAST Performed at [facility], [address], [place], [place] [address]    Culture   Final    FEW CANDIDA DUBLINIENSIS RARE FUNGUS (MOLD) ISOLATED, PROBABLE CONTAMINANT/COLONIZER (SAPROPHYTE). CONTACT MICROBIOLOGY IF FURTHER IDENTIFICATION REQUIRED [phone].    Report Status 12/24/2023 FINAL  Final  MRSA Next Gen by PCR, Nasal     Status: None   Collection Time:  12/21/23  9:32 AM   Specimen: Nasal Mucosa; Nasal Swab  Result Value Ref Range Status   MRSA by PCR Next Gen NOT DETECTED NOT DETECTED Final    Comment: (NOTE) The GeneXpert MRSA Assay (FDA approved for NASAL specimens only), is one component of a comprehensive MRSA colonization surveillance program. It is not intended to diagnose MRSA infection nor to guide or monitor treatment for MRSA infections. Test performance is not FDA approved in patients less than 99 years old. Performed at [facility], [address] [place], [place] [address]    Radiology Studies: IR TUNNELED CENTRAL VENOUS CATH [facility] IMG Result Date: 12/28/2023 INDICATION: RENAL FAILURE, ACCESS FOR LONG-TERM USE EXAM: ULTRASOUND GUIDANCE FOR VASCULAR ACCESS RIGHT INTERNAL JUGULAR PERMANENT HEMODIALYSIS  CATHETER Date:  12/28/2023 12/28/2023 8:50 am Radiologist:  M. Frederic Specking, MD Guidance:  ULTRASOUND AND FLUOROSCOPIC FLUOROSCOPY: Fluoroscopy Time: 0 minutes 36 seconds (3.7 mGy). MEDICATIONS: ANCEF  2 G WITHIN 1 HOUR OF THE PROCEDURE ANESTHESIA/SEDATION: Versed  1.0 mg IV; Fentanyl  25 mcg IV; Moderate Sedation Time:  13 minutes The patient was continuously monitored during the procedure by the interventional radiology nurse under my direct supervision. CONTRAST:  None. COMPLICATIONS: None immediate. PROCEDURE: Informed consent was obtained from the patient following explanation of the procedure, risks, benefits and alternatives. The patient understands, agrees and consents for the procedure. All questions were addressed. A time out was performed. Maximal barrier sterile technique utilized including caps, mask, sterile gowns, sterile gloves, large sterile drape, hand hygiene, and 2% chlorhexidine  scrub. Under sterile conditions and local anesthesia, right internal jugular micropuncture venous access was performed with ultrasound. Images were obtained for documentation of the patent right internal jugular vein. A guide wire was inserted followed by a transitional dilator. Next, a 0.035 guidewire was advanced into the IVC with a 5-French catheter. Measurements were obtained from the right venotomy site to the proximal right atrium. In the right infraclavicular chest, a subcutaneous tunnel was created under sterile conditions and local anesthesia. 1% lidocaine  with epinephrine  was utilized for this. The 19 cm tip to cuff palindrome catheter was tunneled subcutaneously to the venotomy site and inserted into the SVC/RA junction through a valved peel-away sheath. Position was confirmed with fluoroscopy. Images were obtained for documentation. Blood was aspirated from the catheter followed by saline and heparin  flushes. The appropriate volume and strength of heparin  was instilled in each lumen. Caps were applied. The  catheter was secured at the tunnel site with Gelfoam and a pursestring suture. The venotomy site was closed with subcuticular Vicryl suture. Dermabond was applied to the small right neck incision. A dry sterile dressing was applied. The catheter is ready for use. No immediate complications. IMPRESSION: Ultrasound and fluoroscopically guided right internal jugular tunneled hemodialysis catheter (19 cm tip to cuff palindrome catheter). Electronically Signed   By: CHRISTELLA.  Shick M.D.   On: 12/28/2023 10:48   Scheduled Meds:  (feeding supplement) PROSource Plus  30 mL Oral TID AC   amLODipine   5 mg Oral Daily   atorvastatin   80 mg Oral Daily   bisacodyl   10 mg Rectal Daily   carvedilol  12.5 mg Oral BID WC   Chlorhexidine  Gluconate Cloth  6 each Topical Daily   feeding supplement  237 mL Oral BID BM   Gerhardt's butt cream   Topical BID   heparin  injection (subcutaneous)  5,000 Units Subcutaneous Q8H   leptospermum manuka honey  1 Application Topical Daily   metroNIDAZOLE   500 mg Oral Q12H  multivitamin  1 tablet Oral QHS   sevelamer carbonate  1,600 mg Oral TID WC   sodium chloride  flush  10-40 mL Intracatheter Q12H   sodium chloride  flush  5 mL Intracatheter Q8H   thiamine  100 mg Oral Daily   Continuous Infusions:   ceFAZolin  (ANCEF ) IV       LOS: 13 days   Time spent: 55 minutes  Camellia Door, DO  [facility]  12/29/2023, 2:58 PM

## 2023-12-29 NOTE — TOC Progression Note (Addendum)
 Transition of Care [facility]) - Progression Note    Patient Details  Name: Glenn Stewart MRN: [id number] Date of Birth: [birth year]  [facility]) CM/SW Contact  Waddell Barnie Rama, RN Phone Number: 12/29/2023, 2:40 PM  Clinical Narrative:    Patient states his PCP is Jeoffrey Barrio at [facility], he will need a new apt because he missed his apt while he was here in hospital, also he plans to go to his mother's house in [place], per pt eval rec HHPT/ HHOT, and rolling walker. , Patient states he has a rolling walker and a shower chair at home.  He will need HHRN also.  He states that his sister Lucian has set up some HH for him at home,  I am not sure what kind of HH, this NCM called Tena and left message for a return call to see what type of services she has set up for patient.  He has flexiseal, drain and wound care also.  His mother's address is [address], [place].    Expected Discharge Plan: Home/Self Care Barriers to Discharge: Continued Medical Work up               Expected Discharge Plan and Services       Living arrangements for the past 2 months: Single Family Home                                       Social Drivers of Health (SDOH) Interventions SDOH Screenings   Food Insecurity: No Food Insecurity (11/17/2023)  Housing: Low Risk  (11/17/2023)  Transportation Needs: No Transportation Needs (11/17/2023)  Utilities: Not At Risk (11/17/2023)  Tobacco Use: Low Risk  (12/16/2023)    Readmission Risk Interventions    05/19/2022   12:57 PM  Readmission Risk Prevention Plan  Transportation Screening Complete  PCP or Specialist Appt within 5-7 Days Complete  Home Care Screening Complete  Medication Review (RN CM) Complete

## 2023-12-29 NOTE — Plan of Care (Incomplete)
  Problem: Bowel/Gastric: Goal: Gastrointestinal status for postoperative course will improve Outcome: Progressing   

## 2023-12-29 NOTE — Progress Notes (Signed)
    [facility]  Date of Admission:  12/16/2023     Reason for Follow Up: Intra-abdominal abscess [facility])         BRIEF PROGRESS NOTE:  Mr. Acey renal function appears to be improving slowly with Nephrology hopeful for continued recovery. Continues to have drainage from intra-abdominal abscess and will continue current dose of Cefazolin  while inpatient along with metronidazole  and consider changing to Cefadroxil 1 g PO bid if discharging prior to completion of antibiotics. Likely beneficial to have CT imaging to ensure resolution of abscess although appears source control likely achieved. ID will continue to follow and see tomorrow.   Greg Decorian Schuenemann, NP [facility]  12/29/2023  5:22 PM

## 2023-12-29 NOTE — Progress Notes (Signed)
 Hemodialysis completed, 0 ultrafiltration, post condition stable and report was given to the primary RN. Dressing change completed for the right chest hemodialysis catheter.

## 2023-12-30 DIAGNOSIS — I480 Paroxysmal atrial fibrillation: Secondary | ICD-10-CM | POA: Diagnosis not present

## 2023-12-30 DIAGNOSIS — Z951 Presence of aortocoronary bypass graft: Secondary | ICD-10-CM | POA: Diagnosis not present

## 2023-12-30 DIAGNOSIS — K651 Peritoneal abscess: Secondary | ICD-10-CM | POA: Diagnosis not present

## 2023-12-30 DIAGNOSIS — I472 Ventricular tachycardia, unspecified: Secondary | ICD-10-CM | POA: Diagnosis not present

## 2023-12-30 LAB — RENAL FUNCTION PANEL
Albumin: 2.2 g/dL — ABNORMAL LOW (ref 3.5–5.0)
Anion gap: 23 — ABNORMAL HIGH (ref 5–15)
BUN: 117 mg/dL — ABNORMAL HIGH (ref 6–20)
CO2: 17 mmol/L — ABNORMAL LOW (ref 22–32)
Calcium: 7.8 mg/dL — ABNORMAL LOW (ref 8.9–10.3)
Chloride: 90 mmol/L — ABNORMAL LOW (ref 98–111)
Creatinine, Ser: 5.25 mg/dL — ABNORMAL HIGH (ref 0.61–1.24)
GFR, Estimated: 12 mL/min — ABNORMAL LOW (ref >60)
Glucose, Bld: 107 mg/dL — ABNORMAL HIGH (ref 70–99)
Phosphorus: 9.7 mg/dL — ABNORMAL HIGH (ref 2.5–4.6)
Potassium: 4.5 mmol/L (ref 3.5–5.1)
Sodium: 130 mmol/L — ABNORMAL LOW (ref 135–145)

## 2023-12-30 LAB — GLUCOSE, CAPILLARY
Glucose-Capillary: 106 mg/dL — ABNORMAL HIGH (ref 70–99)
Glucose-Capillary: 122 mg/dL — ABNORMAL HIGH (ref 70–99)
Glucose-Capillary: 122 mg/dL — ABNORMAL HIGH (ref 70–99)
Glucose-Capillary: 126 mg/dL — ABNORMAL HIGH (ref 70–99)
Glucose-Capillary: 131 mg/dL — ABNORMAL HIGH (ref 70–99)
Glucose-Capillary: 137 mg/dL — ABNORMAL HIGH (ref 70–99)
Glucose-Capillary: 151 mg/dL — ABNORMAL HIGH (ref 70–99)

## 2023-12-30 NOTE — Progress Notes (Signed)
 [facility]  Date of Admission:  12/16/2023     Reason for Follow Up: Intra-abdominal abscess (HCC)  Total days of antibiotics 13         ASSESSMENT:  Glenn Stewart is a 58 y/o Caucasian male with recent robotic assisted laparoscopic radical prostatectomy presenting with altered mental status and found to have atrial fibrillation with RVR and acute respiratory failure requiring intubation with course complicated by findings of intra-abdominal abscesses now s/p percutaneous drain placement with cultures growing MSSA.   Glenn Stewart continues to have drainage from his percutaneous drain and discussed plan of care to continue current dose of Cefazolin  while remaining inpatient and transition to amoxicillin/clavulanate 875/125 mg PO bid at discharge for total of 10 days of antimicrobial therapy with source controlled. Continue drain management per IR. Will arrange follow up in ID clinic and anticipate repeat imaging to determine need for extension of treatment. Ok for discharge from ID standpoint. Continue standard/universal precautions. Remaining medical and supportive care per Internal Medicine.   PLAN:  Continue current dose of Cefazolin  while inpatient and can change to amoxicillin/clavulante 875/125 mg PO bid at discharge for total of 10 days of treatment with end date of 01/08/24. Drain management per IR.  Standard precautions. Ok for discharge from ID standpoint and will follow up in ID clinic.  Remaining medical and supportive care per Internal Medicine.  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   Class 2 obesity with body mass index (BMI) of 35 to 39.9 without comorbidity   Pressure injury of buttock, unstageable (HCC)    (feeding supplement) PROSource Plus  30 mL Oral TID AC   amLODipine   5 mg Oral Daily    atorvastatin   80 mg Oral Daily   bisacodyl   10 mg Rectal Daily   carvedilol  12.5 mg Oral BID WC   Chlorhexidine  Gluconate Cloth  6 each Topical Daily   feeding supplement  237 mL Oral BID BM   Gerhardt's butt cream   Topical BID   heparin  injection (subcutaneous)  5,000 Units Subcutaneous Q8H   leptospermum manuka honey  1 Application Topical Daily   metroNIDAZOLE   500 mg Oral Q12H   multivitamin  1 tablet Oral QHS   sevelamer carbonate  1,600 mg Oral TID WC   sodium chloride  flush  10-40 mL Intracatheter Q12H   sodium chloride  flush  5 mL Intracatheter Q8H    SUBJECTIVE:  Afebrile overnight with no acute events. Tolerating antibiotics with no adverse side effects. Drain patent. No fevers, chills, sweats or abdominal pain.   Allergies  Allergen Reactions   Sulfonamide Derivatives Hives     Review of Systems: Review of Systems  Constitutional:  Negative for chills, fever and weight loss.  Respiratory:  Negative for cough, shortness of breath and wheezing.   Cardiovascular:  Negative for chest pain and leg swelling.  Gastrointestinal:  Negative for abdominal pain, constipation, diarrhea, nausea and vomiting.  Skin:  Negative for rash.      OBJECTIVE: Vitals:   12/29/23 1727 12/29/23 2105 12/30/23 0441 12/30/23 0818  BP: (!) 119/90 135/83 131/82 134/84  Pulse: (!) 104 89 93 95  Resp: 19 18 18 19   Temp: 98.1 F (36.7 C) 98.2 F (36.8 C) 97.8 F (36.6 C) 98.2 F (36.8 C)  TempSrc:  Oral Oral   SpO2: 100%  97% 99% 99%  Weight:      Height:       Body mass index is 33.53 kg/m.  Physical Exam Constitutional:      General: He is not in acute distress.    Appearance: He is well-developed.     Comments: Lying in bed with head of bed elevated; pleasant.   Cardiovascular:     Rate and Rhythm: Normal rate and regular rhythm.     Heart sounds: Normal heart sounds.  Pulmonary:     Effort: Pulmonary effort is normal.     Breath sounds: Normal breath sounds.   Abdominal:     Comments: Drain in place.  Skin:    General: Skin is warm and dry.  Neurological:     Mental Status: He is alert and oriented to person, place, and time.     Lab Results Lab Results  Component Value Date   WBC 11.0 (H) 12/28/2023   HGB 10.8 (L) 12/28/2023   HCT 33.3 (L) 12/28/2023   MCV 82.4 12/28/2023   PLT 411 (H) 12/28/2023    Lab Results  Component Value Date   CREATININE 5.25 (H) 12/30/2023   BUN 117 (H) 12/30/2023   NA 130 (L) 12/30/2023   K 4.5 12/30/2023   CL 90 (L) 12/30/2023   CO2 17 (L) 12/30/2023    Lab Results  Component Value Date   ALT 27 12/26/2023   AST 39 12/26/2023   ALKPHOS 92 12/26/2023   BILITOT 1.1 12/26/2023     Microbiology: Recent Results (from the past 240 hours)  Culture, blood (Routine X 2) w Reflex to ID Panel     Status: None   Collection Time: 12/20/23 11:31 PM   Specimen: BLOOD LEFT HAND  Result Value Ref Range Status   Specimen Description BLOOD LEFT HAND  Final   Special Requests   Final    BOTTLES DRAWN AEROBIC AND ANAEROBIC Blood Culture adequate volume   Culture   Final    NO GROWTH 5 DAYS Performed at [facility], [address], [place], [place] [address]    Report Status 12/26/2023 FINAL  Final  Culture, blood (Routine X 2) w Reflex to ID Panel     Status: None   Collection Time: 12/21/23 12:44 AM   Specimen: BLOOD LEFT HAND  Result Value Ref Range Status   Specimen Description BLOOD LEFT HAND  Final   Special Requests   Final    BOTTLES DRAWN AEROBIC AND ANAEROBIC Blood Culture adequate volume   Culture   Final    NO GROWTH 5 DAYS Performed at [facility], [address], [place], [place] [address]    Report Status 12/26/2023 FINAL  Final  Culture, Respiratory w Gram Stain     Status: None   Collection Time: 12/21/23  7:57 AM   Specimen: Tracheal Aspirate; Respiratory  Result Value Ref Range Status   Specimen Description TRACHEAL ASPIRATE  Final   Special Requests NONE  Final    Gram Stain   Final    FEW WBC PRESENT, PREDOMINANTLY PMN RARE YEAST Performed at [facility], [address] [place], [place] [address]    Culture   Final    FEW CANDIDA DUBLINIENSIS RARE FUNGUS (MOLD) ISOLATED, PROBABLE CONTAMINANT/COLONIZER (SAPROPHYTE). CONTACT MICROBIOLOGY IF FURTHER IDENTIFICATION REQUIRED [phone].    Report Status 12/24/2023 FINAL  Final  MRSA Next Gen by PCR, Nasal     Status: None   Collection Time: 12/21/23  9:32 AM   Specimen: Nasal Mucosa; Nasal Swab  Result Value Ref Range Status   MRSA by PCR Next Gen NOT DETECTED NOT DETECTED Final    Comment: (NOTE) The GeneXpert MRSA Assay (FDA approved for NASAL specimens only), is one component of a comprehensive MRSA colonization surveillance program. It is not intended to diagnose MRSA infection nor to guide or monitor treatment for MRSA infections. Test performance is not FDA approved in patients less than 14 years old. Performed at [facility], [address], [place], [place] [address]    I have personally spent 35 minutes involved in face-to-face and non-face-to-face activities for this patient on the day of the visit. Professional time spent includes the following activities: preparing to see the patient (review of tests), performing a medically appropriate examination, ordering medications, communicating with other health care professionals, documenting clinical information in the EMR, communicating results and counseling patient regarding medication and plan of care, and care coordination.    Greg Detrick Dani, NP [facility]  12/30/2023  3:04 PM

## 2023-12-30 NOTE — TOC Progression Note (Addendum)
 Transition of Care [facility]) - Progression Note    Patient Details  Name: Glenn Stewart MRN: [id number] Date of Birth: 12-19-1965  [facility] CM/SW Contact  Tom-Johnson, Harvest Muskrat, RN Phone Number: 12/30/2023, 9:31 AM  Clinical Narrative:     CM called patient's sister, Lucian [phone]) about discharge disposition and Home Health recommendation. CM left a secured voicemail to return call.  CM will continue to follow as patient progresses with care towards discharge.   15:20- CM received a call back from patient's sister, Lucian. Lucian states she does not have anything set up for home health and patient will be staying with his mom in [place] at discharge. CM informed Tena that referral has been sent to home health agencies thru the [facility], awaiting their response. Tena states if patient is not able to secure home health, patient can do outpatient therapy.  CM will continue to follow as patient progresses with care towards discharge.       Expected Discharge Plan: Home/Self Care Barriers to Discharge: Continued Medical Work up               Expected Discharge Plan and Services       Living arrangements for the past 2 months: Single Family Home                                       Social Drivers of Health (SDOH) Interventions SDOH Screenings   Food Insecurity: No Food Insecurity (11/17/2023)  Housing: Low Risk  (11/17/2023)  Transportation Needs: No Transportation Needs (11/17/2023)  Utilities: Not At Risk (11/17/2023)  Tobacco Use: Low Risk  (12/16/2023)    Readmission Risk Interventions    05/19/2022   12:57 PM  Readmission Risk Prevention Plan  Transportation Screening Complete  PCP or Specialist Appt within 5-7 Days Complete  Home Care Screening Complete  Medication Review (RN CM) Complete

## 2023-12-30 NOTE — Progress Notes (Signed)
 Occupational Therapy Treatment Patient Details Name: Glenn Stewart MRN: [id number] DOB: 1966/02/14 Today's Date: 12/30/2023   History of present illness Pt is 58 year old presented to [facility] on  12/16/23 for weakness and tender abdomen. Pt with afib with rvr and vtach and underwent emergent cardioversion. Post cardioversion hypotension and resp distress and intubated. Pt with shock and AKI and intraabdominal fluid collections concerning for urinoma. Abdominal drain placement on 9/27. Pt started on CRRT and transitioned to HD on 10/1. AbPMH - 8/28 prostatectomy, htn, CABG, afib, NASH, bipolar, ADHD   OT comments  Pt. Seen for skilled OT treatment session.  Requesting back to bed secondary to pain with sitting up in recliner.  Able to complete sit/stand, and short steps to bed with CGA.  Bed mobility with use of head board and bed features with S.  Agree with current D/C recommendations.  Cont. With acute OT POC.        If plan is discharge home, recommend the following:  A little help with walking and/or transfers;A little help with bathing/dressing/bathroom;Assistance with cooking/housework;Assist for transportation;Help with stairs or ramp for entrance;Supervision due to cognitive status   Equipment Recommendations  Other (comment);Tub/shower seat    Recommendations for Other Services PT consult    Precautions / Restrictions Precautions Precautions: Fall;Other (comment) Precaution/Restrictions Comments: Abdominal drain       Mobility Bed Mobility Overal bed mobility: Needs Assistance Bed Mobility: Sit to Supine       Sit to supine: Supervision   General bed mobility comments: able to transition from sitting eob to laying down without physical assistance of BLEs.  utilized hob and bues to pull self up in bed, assisted pt. with bles positioning with bed functions to prevent sliding down in bed    Transfers Overall transfer level: Needs assistance Equipment used: Rolling walker  (2 wheels) Transfers: Sit to/from Stand, Bed to chair/wheelchair/BSC Sit to Stand: Min assist, Contact guard assist     Step pivot transfers: Contact guard assist     General transfer comment: Assist to initially steady upon standing     Balance                                           ADL either performed or assessed with clinical judgement   ADL Overall ADL's : Needs assistance/impaired                         Toilet Transfer: Contact guard assist;Ambulation;Rolling walker (2 wheels) Toilet Transfer Details (indicate cue type and reason): simulated from recliner with steps to bed                Extremity/Trunk Assessment              Vision       Perception     Praxis     Communication Communication Communication: Impaired Factors Affecting Communication: Hearing impaired   Cognition Arousal: Alert Behavior During Therapy: WFL for tasks assessed/performed                                 Following commands: Intact        Cueing   Cueing Techniques: Verbal cues  Exercises      Shoulder Instructions       General Comments  Pertinent Vitals/ Pain       Pain Assessment Pain Assessment: Faces Faces Pain Scale: Hurts whole lot Pain Location: buttocks Pain Descriptors / Indicators: Burning Pain Intervention(s): Repositioned  Home Living                                          Prior Functioning/Environment              Frequency  Min 2X/week        Progress Toward Goals  OT Goals(current goals can now be found in the care plan section)  Progress towards OT goals: Progressing toward goals     Plan      Co-evaluation                 AM-PAC OT 6 Clicks Daily Activity     Outcome Measure   Help from another person eating meals?: A Little Help from another person taking care of personal grooming?: A Little Help from another person toileting, which  includes using toliet, bedpan, or urinal?: A Little Help from another person bathing (including washing, rinsing, drying)?: A Little Help from another person to put on and taking off regular upper body clothing?: A Little Help from another person to put on and taking off regular lower body clothing?: A Little 6 Click Score: 18    End of Session Equipment Utilized During Treatment: Rolling walker (2 wheels);Gait belt  OT Visit Diagnosis: Unsteadiness on feet (R26.81);Other abnormalities of gait and mobility (R26.89);Muscle weakness (generalized) (M62.81)   Activity Tolerance Patient tolerated treatment well   Patient Left in bed;with call bell/phone within reach   Nurse Communication Other (comment) (rn states ok to work with pt. also alerted cna pts. request for ice chips and a cup)        Time: 8697-8680 OT Time Calculation (min): 17 min  Charges: OT General Charges $OT Visit: 1 Visit OT Treatments $Self Care/Home Management : 8-22 mins  Randall, COTA/L Acute Rehabilitation [phone]   CHRISTELLA Nest Lorraine-COTA/L  12/30/2023, 2:19 PM

## 2023-12-30 NOTE — Progress Notes (Signed)
 PROGRESS NOTE    Glenn Stewart  FMW:[id number] DOB: Sep 30, 1965 DOA: 12/16/2023 PCP: Pcp, No  Subjective: No new complaints.  Summary/Hospital Course: 58 yoM with PMH of HTN, CAD, CABG x 5 04/2022 with post op Afib since stopped on Eliquis  and amiodarone  09/2022, HTN, HLD, GERD, NASH, bipolar/ ADHD and s/p recent robotic assisted laparoscopic radial prostatectomy on 11/17/23 who presented for weakness and waxing mental status with distended and tender abdominal by EMS found with episodes of Afib with RVR and VT.  Was hemodynamically unstable on arrival, underwent emergent cardioversion and given amiodarone  x2 and magnesium , with emerging rhythm of afib with RVR possible flutter.  Cardiology consulted. Post cardioversion became hypotensive with respiratory distress therefore intubated and placed on norepinephrine .  Difficulty oxygenating post intubation with normal PIP requiring 1.0 and 12 PEEP.  Labs noted for K2.8, Na 129, bicarb 16, BUN/ sCr 55/ 3.92 (recently  ), t. Bili 3.4, AST 68, albumin  1.6, protein 3.4, INR 1.2.  CTH neg, CTA PE and CT a/p w/contrast pending official read.  2.1L of brownish OGT contents s/p OGT placement. Cultures sent, empiric zosyn / vanc started.  Afebrile, anuric and since weaned off pressors.  R internal jugular CVL placed by EDP.  PCCM consulted for admission.      patient self-irrigated catheter a couple days after initial surgery and then removed catheter himself prior to follow-up at 1 week. Patient reported that he cut tubing prior to removal but unable to confirm if removal was truly atraumat    Significant Events: Admitted 12/16/2023 to ICU by PCCM for septic shock, respiratory failure with hypoxia, intra-abdominal abscesses 12-16-2023 intubated in ER. Emergently cardioverted due to sustained ventricular tachycardia. Due to hypotension, started on IV levophed  09/27 s/p IR drainage of RLQ/pelvic collection (Cx abundant staph); 300 mL  9/28 - On vent. Ongoing  CRRT. Seen by surgery team - still concernd about Urinoma and secondary bowel irritation and not a true obstruction. NGT output is low c/w ileus. CCS does  NOT want enteral medication. Recommnding max only trophic feds. CUlture negative. Afebrile.  Stopped on Zyvox  and Zosyn  9/29 SBO Xr protocol per Gen Surgery with oral contrast pooling in the gastric fundus. MSSA in abdominal cultures and Enterobacter cloacae in Ucx - on Cefepime and Flagyl  SBT trial failed secondary to RR >40 and increased WOB. Back on PRVC 9/30 discontinued CRRT.  10/1 - switched to meropenem. Repeat CT A/P with stable intraabdominal abscesses. Started iHD 10/03 liberated from mechanical ventilation.  10/05 transfer to [facility].  10/06 plan for HD tunneled catheter  10/97 rectal tube has been removed.   Admission Labs: Na 129, K 2.8, CO2 of 16, BUN 55, Scr 3.92, glu 118, Ca 6.3 T. Prot 5.1, alb 1.6, AST 68, ALT 37, alk phos 69, t. Bili 3.4,  WBC 6.0, HgB 12.6, Plt 324 Triglycerides 145 Urine drug screen negative UA cloudy, moderate HgB, negative nitrite, Large LE, WBC >50, rare bacteria ABG pH 7.219, PCO2 of 54, PO2 of 74   Admission Imaging Studies: CXR Well-positioned endotracheal and esophagogastric tubes. No pneumothorax. 2. Markedly low lung volumes.  No pneumonia or pleural effusion. CT head No acute intracranial pathology.  CTPA, CT abd/pelvis No evidence of significant pulmonary embolus. Fluid-filled distended small bowel with decompressed terminal ileum consistent with small-bowel obstruction. 2. Irregular wall thickening diffusely throughout the distal small bowel and right colon suggesting enterocolitis or inflammatory bowel disease. Inflammatory strictures may be present. 3. Multiple loculated fluid collections, some with gas, demonstrated in the  right abdomen and pelvis indicating abscesses. This is likely due to perforation, possibly related to inflammatory bowel disease or to an occult perforated appendicitis. The  appendix is self is not demonstrated. No free intra-abdominal air. 4. Small amount of free fluid in the abdomen is likely reactive. 5. Cholelithiasis without evidence of acute cholecystitis. 6. Aortic atherosclerosis. 7. Consolidation versus compressive atelectasis in both lung bases. Small bilateral pleural effusions. CT cystogram CT cystogram with Foley catheter in place. Contrast material leaks along the catheter through the urethra. There is focal irregularity at the junction of the bladder base and urethra with small anterior contrast pooling suggesting contained perforation. No intraperitoneal contrast extravasation. 2. Appearance examination is otherwise unchanged. Again demonstrated are multiple loculated fluid collections in the abdomen and pelvis consistent with abscesses. Wall thickening of the cecum and terminal ileum. Fluid-filled dilated small bowel appear mildly improved.   Significant Labs: 12-16-2023 urine cx enterobacter cloacae 12-17-2023 abscess culture. MSSA Staph aureus   Significant Imaging Studies:     Antibiotic Therapy: Anti-infectives (From admission, onward)      Start     Dose/Rate Route Frequency Ordered Stop    12/21/23 1600   meropenem (MERREM) 1 g in sodium chloride  0.9 % 100 mL IVPB        1 g 200 mL/hr over 30 Minutes Intravenous Every 24 hours 12/21/23 0853 12/28/23 2359    12/19/23 1045   ceFEPIme (MAXIPIME) 2 g in sodium chloride  0.9 % 100 mL IVPB  Status:  Discontinued        2 g 200 mL/hr over 30 Minutes Intravenous Every 12 hours 12/19/23 0955 12/21/23 0843    12/19/23 1045   metroNIDAZOLE  (FLAGYL ) IVPB 500 mg  Status:  Discontinued        500 mg 100 mL/hr over 60 Minutes Intravenous Every 12 hours 12/19/23 0955 12/21/23 0843    12/17/23 1600   piperacillin -tazobactam (ZOSYN ) IVPB 2.25 g  Status:  Discontinued        2.25 g 100 mL/hr over 30 Minutes Intravenous Every 8 hours 12/17/23 0931 12/17/23 1144    12/17/23 1600   piperacillin -tazobactam  (ZOSYN ) IVPB 3.375 g  Status:  Discontinued        3.375 g 100 mL/hr over 30 Minutes Intravenous Every 6 hours 12/17/23 1157 12/19/23 0955    12/17/23 1230   piperacillin -tazobactam (ZOSYN ) IVPB 3.375 g  Status:  Discontinued        3.375 g 100 mL/hr over 30 Minutes Intravenous Every 6 hours 12/17/23 1144 12/17/23 1157    12/17/23 1000   linezolid  (ZYVOX ) IVPB 600 mg  Status:  Discontinued        600 mg 300 mL/hr over 60 Minutes Intravenous Every 12 hours 12/16/23 1924 12/19/23 0958    12/17/23 0000   piperacillin -tazobactam (ZOSYN ) IVPB 3.375 g  Status:  Discontinued        3.375 g 12.5 mL/hr over 240 Minutes Intravenous Every 8 hours 12/16/23 1924 12/17/23 0931    12/16/23 1600   piperacillin -tazobactam (ZOSYN ) IVPB 3.375 g        3.375 g 12.5 mL/hr over 240 Minutes Intravenous  Once 12/16/23 1551 12/16/23 1602    12/16/23 1600   vancomycin  (VANCOREADY) IVPB 1750 mg/350 mL        1,750 mg 175 mL/hr over 120 Minutes Intravenous  Once 12/16/23 1551 12/16/23 1816           Procedures: 12-16-2023 intubation, cardioversion, Right internal jugular CVL, arterial line insertion 12-17-2023 IR  drain placement into RLQ abd abscess   Consultants: PCCM Cardiology Urology General surgery ID       Assessment and Plan: * Intra-abdominal abscess (HCC) Prior to 12/28/23 Septic shock resolved.  Multiple abdominal abscesses due to MSSA.  Urinary tract infection due to enterobacter cloacae.  Initially placed on metronidazole  and cefepime, then because persistent fever has been changed to meropenem.  Follow up CT abdomen and pelvis with stable or improvement in abdominal abscess.   Follow up cell count is 11.0  Cultures from blood have been no growth.   Plan to continue antibiotic therapy with meropenem (started on 10/01).  Will plan for now to continue antibiotic therapy until 10/08   12/28/23 pt has completed 10 days of Cefepime/Meropenem for enterobacter cloacae abscess. Asking ID  if he needs more IV/PO abx. Asking urology for input about his intra-abd drains that were placed by IR.  General surgery signed off on 12-26-2023.  12/29/23 ID consulted yesterday to come up with a plan for abx therapy.  Still has abd drain. Pt states urology going to remove his foley catheter today. 12/30/2023: Antibiotics as per infectious disease.  Pressure injury of buttock, unstageable (HCC) 12/28/23 Noted by wound care care on 12-26-2023. Previously noted Deep tissue pressure injury is beginning to evolve into an Unstageable pressure injury with areas of slough to the edges across bilat buttocks/sacrum, approx 6X6 cm. Was not present on admission.   Class 2 obesity with body mass index (BMI) of 35 to 39.9 without comorbidity Body mass index is 34.27 kg/m.   Acute renal failure on dialysis Prior to 12/28/23  ATN. Hyponatremia hyperkalemia.  09/30 Stopped CRRT  10/01 started intermittent hemodialysis.   Urine output is 2250 cc (foley in place)  BUN 143, cr 7,44, with K at 4.7 and serum bicarbonate at 17  Na 130   Plan for tunneled HD access.    Patient continue renal replacement therapy with hemodialysis, per nephrology recommendations   12/29/23 remains on HD. Showing some signs of renal recovery. Produced 3.4L of urine. He now has right internal jugular permcath. Renal coordinator trying to figure out where pt is going to get outpt HD if he goes to [place] and lives with his mother.  12/30/2023: No hemodialysis today.  From nephrology team is noted.  Patient is nonoliguric.  Impaired clearance.  Continue to assess daily.   Prostate cancer [facility]) Prior to 12/28/23 Sp robotic assisted laparoscopic prostatectomy on 11/17/23 Continue foley catheter, plan to repeat cystogram 10/7 to 10/10 per urology.   12/28/23 urology following  Coronary artery disease 12/28/23 No chest pain, no acute coronary syndrome.   12/29/23 stable.   Essential hypertension 12/28/23 Continue  blood pressure control with amlodipine  and carvedilol.   12/29/23 stable on norvasc  and coreg.   Atrial fibrillation with RVR (HCC)-resolved as of 12/28/2023  >>ASSESSMENT AND PLAN FOR PAF (PAROXYSMAL ATRIAL FIBRILLATION) (HCC) WRITTEN ON 12/28/2023  1:46 PM BY CHEN, ERIC, DO  Prior to 12/28/23 Ventricular tachycardia sp cardioversion.   Continue with carvedilol 12.5 mg po bid.  Patient not on anticoagulation. Continue telemetry monitoring EP has been consulted with recommendations to stop amiodarone  and continue close monitoring.   12/28/23 cards/EP felt that pt's VT was due to his sepsis. Amio stopped. On coreg 12.5 mg bid. Ziopatch ordered to be placed prior to discharge.   VT (ventricular tachycardia) (HCC)-resolved as of 12/28/2023 Prior to 12/28/23 Ventricular tachycardia sp cardioversion.   Continue with carvedilol 12.5 mg po bid.  Patient not  on anticoagulation. Continue telemetry monitoring EP has been consulted with recommendations to stop amiodarone  and continue close monitoring.   12/28/23 cards/EP felt that pt's VT was due to his sepsis. Amio stopped. On coreg 12.5 mg bid. Ziopatch ordered to be placed prior to discharge.   Acute metabolic encephalopathy-resolved as of 12/28/2023 Prior to 12/28/23 Mentation has been improving, continue with mild confusion but not agitation. Continue nutritional support, PT and OT.  Neuro checks per unit protocol.   12/28/23 resolved.   Bilateral pneumonia-resolved as of 12/28/2023 Prior to 12/28/23 (Present on admission)  Acute hypoxemic and hypercapnic respiratory failure.  Moderate ARDS.  Required invasive mechanical ventilation.   Completed antibiotic therapy 02 saturation today is 100% on room air.   12/28/23 resolved.   Ileus (HCC)-resolved as of 12/28/2023 Prior to 12/28/23 Patient has been weaned off TPN and now tolerating po well.  Ileus has resolved, advanced to soft today.  Surgery has signed off  12-26-2023  DVT prophylaxis: heparin  injection 5,000 Units Start: 12/17/23 1400 SCDs Start: 12/16/23 1727    Code Status: Full Code Family Communication:  Disposition Plan: Unclear  Objective: Vitals:   12/29/23 2105 12/30/23 0441 12/30/23 0818 12/30/23 1638  BP: 135/83 131/82 134/84 117/82  Pulse: 89 93 95 98  Resp: 18 18 19 19   Temp: 98.2 F (36.8 C) 97.8 F (36.6 C) 98.2 F (36.8 C) 98.3 F (36.8 C)  TempSrc: Oral Oral    SpO2: 97% 99% 99% 99%  Weight:      Height:        Intake/Output Summary (Last 24 hours) at 12/30/2023 1839 Last data filed at 12/30/2023 1826 Gross per 24 hour  Intake 1348.82 ml  Output 2270 ml  Net -921.18 ml   Filed Weights   12/27/23 1917 12/29/23 0847 12/29/23 1219  Weight: 93.4 kg 93.4 kg 91.4 kg    Examination:  Physical Exam Vitals and nursing note reviewed.  Constitutional:      General: He is not in acute distress.    Appearance: He is not ill-appearing, toxic-appearing or diaphoretic.  HENT:     Right Ear: External ear normal.     Left Ear: External ear normal.     Nose: Nose normal.     Mouth/Throat:     Mouth: Mucous membranes are moist.     Pharynx: Oropharynx is clear.  Eyes:     Extraocular Movements: Extraocular movements intact.     Pupils: Pupils are equal, round, and reactive to light.     Comments: Patient is pale.  Cardiovascular:     Heart sounds: Normal heart sounds.  Pulmonary:     Effort: Pulmonary effort is normal.     Breath sounds: Normal breath sounds.  Abdominal:     General: Bowel sounds are normal.  Genitourinary:    Comments: +foley Musculoskeletal:     Cervical back: Normal range of motion and neck supple.  Skin:    Comments: Right ant chest wall permcath  Neurological:     Comments: Awake and alert.     Data Reviewed: I have personally reviewed following labs and imaging studies  CBC: Recent Labs  Lab 12/28/23 0232  WBC 11.0*  HGB 10.8*  HCT 33.3*  MCV 82.4  PLT 411*   Basic  Metabolic Panel: Recent Labs  Lab 12/24/23 0512 12/25/23 0630 12/26/23 0316 12/27/23 0813 12/28/23 0232 12/29/23 0221 12/30/23 0721  NA 134* 135 133* 130* 130* 129* 130*  K 4.1 4.1 4.5 4.7 5.1 4.2 4.5  CL 98 95* 95* 95* 92* 92* 90*  CO2 19* 23 20* 17* 18* 17* 17*  GLUCOSE 202* 113* 99 122* 126* 111* 107*  BUN 113* 89* 114* 143* 125* 135* 117*  CREATININE 5.92* 5.56* 6.71* 7.44* 6.26* 6.28* 5.25*  CALCIUM  7.5* 7.8* 8.0* 7.8* 7.4* 7.7* 7.8*  MG 2.4 2.2 2.4  --   --   --   --   PHOS 6.4* 6.4* 8.9* 11.4*  --  10.2* 9.7*   GFR: Estimated Creatinine Clearance: 15.8 mL/min (A) (by C-G formula based on SCr of 5.25 mg/dL (H)). Liver Function Tests: Recent Labs  Lab 12/24/23 0512 12/25/23 0630 12/26/23 0316 12/27/23 0813 12/29/23 0221 12/30/23 0721  AST 32 30 39  --   --   --   ALT 20 20 27   --   --   --   ALKPHOS 90 82 92  --   --   --   BILITOT 0.7 0.9 1.1  --   --   --   PROT 6.0* 5.9* 6.6  --   --   --   ALBUMIN  1.6* 1.6* 1.9* 2.0* 2.0* 2.2*   CBG: Recent Labs  Lab 12/30/23 0000 12/30/23 0438 12/30/23 0821 12/30/23 1203 12/30/23 1639  GLUCAP 137* 122* 151* 106* 126*   Recent Results (from the past 240 hours)  Culture, blood (Routine X 2) w Reflex to ID Panel     Status: None   Collection Time: 12/20/23 11:31 PM   Specimen: BLOOD LEFT HAND  Result Value Ref Range Status   Specimen Description BLOOD LEFT HAND  Final   Special Requests   Final    BOTTLES DRAWN AEROBIC AND ANAEROBIC Blood Culture adequate volume   Culture   Final    NO GROWTH 5 DAYS Performed at [facility], [address], [place], [place] [address]    Report Status 12/26/2023 FINAL  Final  Culture, blood (Routine X 2) w Reflex to ID Panel     Status: None   Collection Time: 12/21/23 12:44 AM   Specimen: BLOOD LEFT HAND  Result Value Ref Range Status   Specimen Description BLOOD LEFT HAND  Final   Special Requests   Final    BOTTLES DRAWN AEROBIC AND ANAEROBIC Blood Culture adequate  volume   Culture   Final    NO GROWTH 5 DAYS Performed at [facility], [address], [place], [place] [address]    Report Status 12/26/2023 FINAL  Final  Culture, Respiratory w Gram Stain     Status: None   Collection Time: 12/21/23  7:57 AM   Specimen: Tracheal Aspirate; Respiratory  Result Value Ref Range Status   Specimen Description TRACHEAL ASPIRATE  Final   Special Requests NONE  Final   Gram Stain   Final    FEW WBC PRESENT, PREDOMINANTLY PMN RARE YEAST Performed at [facility], [address], [place], [place] [address]    Culture   Final    FEW CANDIDA DUBLINIENSIS RARE FUNGUS (MOLD) ISOLATED, PROBABLE CONTAMINANT/COLONIZER (SAPROPHYTE). CONTACT MICROBIOLOGY IF FURTHER IDENTIFICATION REQUIRED [phone].    Report Status 12/24/2023 FINAL  Final  MRSA Next Gen by PCR, Nasal     Status: None   Collection Time: 12/21/23  9:32 AM   Specimen: Nasal Mucosa; Nasal Swab  Result Value Ref Range Status   MRSA by PCR Next Gen NOT DETECTED NOT DETECTED Final    Comment: (NOTE) The GeneXpert MRSA Assay (FDA approved for  NASAL specimens only), is one component of a comprehensive MRSA colonization surveillance program. It is not intended to diagnose MRSA infection nor to guide or monitor treatment for MRSA infections. Test performance is not FDA approved in patients less than 24 years old. Performed at [facility], [address], [place], [place] [address]    Radiology Studies: No results found.  Scheduled Meds:  (feeding supplement) PROSource Plus  30 mL Oral TID AC   amLODipine   5 mg Oral Daily   atorvastatin   80 mg Oral Daily   bisacodyl   10 mg Rectal Daily   carvedilol  12.5 mg Oral BID WC   Chlorhexidine  Gluconate Cloth  6 each Topical Daily   feeding supplement  237 mL Oral BID BM   Gerhardt's butt cream   Topical BID   heparin  injection (subcutaneous)  5,000 Units Subcutaneous Q8H   leptospermum manuka honey  1 Application Topical Daily    metroNIDAZOLE   500 mg Oral Q12H   multivitamin  1 tablet Oral QHS   sevelamer carbonate  1,600 mg Oral TID WC   sodium chloride  flush  10-40 mL Intracatheter Q12H   sodium chloride  flush  5 mL Intracatheter Q8H   Continuous Infusions:   ceFAZolin  (ANCEF ) IV 1 g (12/30/23 1212)     LOS: 14 days   Time spent: 35 minutes  Leatrice LILLETTE Chapel, MD  [facility]  12/30/2023, 6:39 PM

## 2023-12-30 NOTE — Progress Notes (Signed)
 Admit: 12/16/2023 LOS: 14  62M with AKI after presenting with encephalopathy, A-fib with RVR/ventricular tachycardia, found to have likely abdominal urinomas with abscess, now requiring RRT TTS with some signs of renal recovery.    Subjective:  UOP 1750 mL  Had HD yesterday tolerated fine No new issues in good spirits    10/09 0701 - 10/10 0700 In: 583.8 [P.O.:520; IV Piggyback:48.8] Out: 3870 [Urine:1750; Drains:120]  Filed Weights   12/27/23 1917 12/29/23 0847 12/29/23 1219  Weight: 93.4 kg 93.4 kg 91.4 kg    Scheduled Meds:  (feeding supplement) PROSource Plus  30 mL Oral TID AC   amLODipine   5 mg Oral Daily   atorvastatin   80 mg Oral Daily   bisacodyl   10 mg Rectal Daily   carvedilol  12.5 mg Oral BID WC   Chlorhexidine  Gluconate Cloth  6 each Topical Daily   feeding supplement  237 mL Oral BID BM   Gerhardt's butt cream   Topical BID   heparin  injection (subcutaneous)  5,000 Units Subcutaneous Q8H   leptospermum manuka honey  1 Application Topical Daily   metroNIDAZOLE   500 mg Oral Q12H   multivitamin  1 tablet Oral QHS   sevelamer carbonate  1,600 mg Oral TID WC   sodium chloride  flush  10-40 mL Intracatheter Q12H   sodium chloride  flush  5 mL Intracatheter Q8H   Continuous Infusions:   ceFAZolin  (ANCEF ) IV Stopped (12/30/23 0022)   PRN Meds:.ALPRAZolam , mouth rinse, sodium chloride  flush  Current Labs: reviewed    Physical Exam:  Blood pressure 134/84, pulse 95, temperature 98.2 F (36.8 C), resp. rate 19, height 5' 5 (1.651 m), weight 91.4 kg, SpO2 99%.  GEN: NAD, on RA Neck: R TDC c/d/I - small dried blood at exit site not bad CV: Regular, normal S1 and S2 PULM: BLBS ABD: soft EXT: no LEE   A AKI, anuric, baseline normal GFR.  No evidence of obstruction on imaging at presentation.  Suspect ATN.  Repeat bladder pressure not consistent with abdominal compartment syndrome on 9/27.  CT A/P w contrast, 10/1: Renal unremarkable. Now nonoliguric.   Hyponatremia, mild Abdominal fluid collections concerning for urinoma after radical prostatectomy 11/14/2023; status post drain placement 9/27 by IR with more than 1 L output since placement Ileus versus SBO, surgery following, ileus favored and now resolved VDRF resolved A-fib with RVR and V. tach at presentation, cardiology following on amiodarone  Septic shock on linezolid  and Zosyn , Off pressors, improving Hypertension: On amlodipine  5 daily, Lopressor  25 BID.  BP somewhat labile but generally ok Hyperphosphatemia setting of AKI on phos binder Metabolic acidosis, improving on HD. Anemia: mild Hb 10s   P Stopped CRRT 9/30.  1st HD, 10/1, last 10/9 - tentatively next tomorrow but if labs improving will hold Cont daily labs and UOP assessment - looks like he's recovering but clearance lagging Outpatient AKI HD CLIP underway while we concurrently monitor for renal recovery - d/c planning complex as going to [facility] I'm told; pt doesn't seem to have a clear picture of where he's going Daily weights, Daily Renal Panel, Strict I/Os, Avoid nephrotoxins (NSAIDs, judicious IV Contrast)  Medication Issues; Preferred narcotic agents for pain control are hydromorphone , fentanyl , and methadone. Morphine  should not be used.  Baclofen should be avoided Avoid oral sodium phosphate  and magnesium  citrate based laxatives / bowel preps     Dr. Manuelita DELENA Barters  12/30/2023, 10:55 AM  Recent Labs  Lab 12/27/23 0813 12/28/23 9767 12/29/23 0221 12/30/23 0721  NA  130* 130* 129* 130*  K 4.7 5.1 4.2 4.5  CL 95* 92* 92* 90*  CO2 17* 18* 17* 17*  GLUCOSE 122* 126* 111* 107*  BUN 143* 125* 135* 117*  CREATININE 7.44* 6.26* 6.28* 5.25*  CALCIUM  7.8* 7.4* 7.7* 7.8*  PHOS 11.4*  --  10.2* 9.7*   Recent Labs  Lab 12/23/23 1129 12/28/23 0232  WBC  --  11.0*  HGB 10.5* 10.8*  HCT 31.0* 33.3*  MCV  --  82.4  PLT  --  411*

## 2023-12-30 NOTE — Progress Notes (Signed)
  Progress Note  Patient Name: ALYAS CREARY Date of Encounter: 12/30/2023 [facility] Cardiologist: Redell Shallow, MD   Interval Summary   Denies any chest pain or shortness of breath More active in halls   Vital Signs Vitals:   12/29/23 1727 12/29/23 2105 12/30/23 0441 12/30/23 0818  BP: (!) 119/90 135/83 131/82 134/84  Pulse: (!) 104 89 93 95  Resp: 19 18 18 19   Temp: 98.1 F (36.7 C) 98.2 F (36.8 C) 97.8 F (36.6 C) 98.2 F (36.8 C)  TempSrc:  Oral Oral   SpO2: 100% 97% 99% 99%  Weight:      Height:        Intake/Output Summary (Last 24 hours) at 12/30/2023 0900 Last data filed at 12/30/2023 0535 Gross per 24 hour  Intake 463.82 ml  Output 3470 ml  Net -3006.18 ml      12/29/2023   12:19 PM 12/29/2023    8:47 AM 12/27/2023    7:17 PM  Last 3 Weights  Weight (lbs) 201 lb 8 oz 205 lb 14.6 oz 205 lb 14.6 oz  Weight (kg) 91.4 kg 93.4 kg 93.4 kg      Telemetry/ECG  Normal sinus rhythm with heart rates in the 70s to 90s- Personally Reviewed  Physical Exam   No distress Tunneled cath right subclavian 2/6 SEM murmur prior sternotomy Abdomen with drain RLQ Trace edema   Assessment & Plan    Intraabdominal abscess Septic shock - resolved ileus was admitted to the hospital with intra abdominal abscess complicated with shock. This has largely resolved, is off pressors, and surgery has signed off. Currently on IV Ancef     Suspected VT. Had Sustained VT in ER on 12/16/23 requiring cardioversion with IV Mg and Amiodarone . After that was in afib vs flutter Stop oral amiodarone  EP saw the patient and felt like the cause of the VT was due to the illness and was likely reversible.  It was also felt that the risks of a device would outweigh the benefits with the recent infections.  Because of this EP recommended stopping his amiodarone  and monitoring his rhythm. Order live 14-day ZIO monitor prior to discharge.     CAD s/p CABG x 5 in  04/2022 Hyperlipidemia Elevated troponin 82 > 95 Will likely need ischemic eval but will differ for now given has AKI requiring HD.  May consider outpatient nuclear stress test. Echo on 12/17/2023 showed a low normal LVEF of 50 to 55% restart home atorvastatin  80 mg daily COREG 12.5mg  BID. Rhythm has been stable since initial event NSR      Hypertension BP now improved  Continue amlodipine  10 mg daily Continue coreg      Hypoxic respiratory failure -resolved extubated 10/3 SPO2 about 100% on room air.     AKI  Undergoing intermittent HD per nephrology. Tunneled right subclavian catheter in place     Otherwise management per primary.    For questions or updates, please contact [facility] Please consult [email or web address] for contact info under       Maude Emmer MD [facility]  Signed, Maude Emmer, MD

## 2023-12-30 NOTE — Consult Note (Signed)
 Glenn Stewart is getting much better.  He's making urine and bowels are functioning normally.  His drain output is trending down.  In review of his chart and CT scans I actually don't think there was any extravasation on his Cystogram.  I think his fluid collection was actually a lymphocele.  I sent the drain for triglycerides to sort that out.  Regardless it was infected.  ID recommends two weeks of abx.  If the triglyerides in fluid suggest that this is infact a lymphocele I would recommend just pulling the drain, but would check with IR who placed drain first.  I have removed his catheter today.  He will have severe incontinence since he's been catheterized for so long and his prostate has only recently been removed.  He understands this, and knows the exercises that he needs to do.  Once his foley and drains are removed the remaining issue for him would be his renal function - which appears to be slowly improving and potentially he wouldn't need HD.  I will schedule him to see me in 6 weeks.  We will sign-off.  Thank you to all whom have helped save this patient's life, nurse him back, and all the excellent care provided to him while he recovers from his prostate surgery.

## 2023-12-30 NOTE — Progress Notes (Signed)
 Physical Therapy Treatment Patient Details Name: Glenn Stewart MRN: [id number] DOB: 1965/11/27 Today's Date: 12/30/2023   History of Present Illness Pt is 58 year old presented to [facility] on  12/16/23 for weakness and tender abdomen. Pt with afib with rvr and vtach and underwent emergent cardioversion. Post cardioversion hypotension and resp distress and intubated. Pt with shock and AKI and intraabdominal fluid collections concerning for urinoma. Abdominal drain placement on 9/27. Pt started on CRRT and transitioned to HD on 10/1. AbPMH - 8/28 prostatectomy, htn, CABG, afib, NASH, bipolar, ADHD    PT Comments  Pt progressing steadily towards his physical therapy goals and is motivated to participate. Pt with recent foley catheter removal and urinary incontinence upon standing. Pt requiring min assist for transfers and ambulating 120 ft with a RW. Demonstrates weakness, impaired standing balance, and decreased endurance, but overall is progressing as expected. Recommendation of HHPT remains appropriate.    If plan is discharge home, recommend the following: A little help with walking and/or transfers;A little help with bathing/dressing/bathroom;Assistance with cooking/housework;Assist for transportation;Help with stairs or ramp for entrance   Can travel by private vehicle        Equipment Recommendations  Rolling walker (2 wheels)    Recommendations for Other Services       Precautions / Restrictions Precautions Precautions: Fall;Other (comment) Precaution/Restrictions Comments: Abdominal drain Restrictions Weight Bearing Restrictions Per Provider Order: No     Mobility  Bed Mobility Overal bed mobility: Needs Assistance Bed Mobility: Supine to Sit     Supine to sit: Supervision          Transfers Overall transfer level: Needs assistance Equipment used: Rolling walker (2 wheels) Transfers: Sit to/from Stand Sit to Stand: Min assist, Contact guard assist            General transfer comment: Assist to initially steady upon standing    Ambulation/Gait Ambulation/Gait assistance: Contact guard assist Gait Distance (Feet): 120 Feet Assistive device: Rolling walker (2 wheels) Gait Pattern/deviations: Step-through pattern, Decreased stride length           Stairs             Wheelchair Mobility     Tilt Bed    Modified Rankin (Stroke Patients Only)       Balance Overall balance assessment: Needs assistance Sitting-balance support: No upper extremity supported, Feet supported Sitting balance-Leahy Scale: Good     Standing balance support: Bilateral upper extremity supported, During functional activity Standing balance-Leahy Scale: Poor                              Communication Communication Communication: Impaired Factors Affecting Communication: Hearing impaired  Cognition Arousal: Alert Behavior During Therapy: WFL for tasks assessed/performed   PT - Cognitive impairments: Difficult to assess Difficult to assess due to: Hard of hearing/deaf                       Following commands: Intact      Cueing Cueing Techniques: Verbal cues  Exercises      General Comments        Pertinent Vitals/Pain Pain Assessment Pain Assessment: No/denies pain    Home Living                          Prior Function            PT Goals (current goals  can now be found in the care plan section) Acute Rehab PT Goals Patient Stated Goal: go to sister's home to recover Potential to Achieve Goals: Good Progress towards PT goals: Progressing toward goals    Frequency    Min 2X/week      PT Plan      Co-evaluation              AM-PAC PT 6 Clicks Mobility   Outcome Measure  Help needed turning from your back to your side while in a flat bed without using bedrails?: None Help needed moving from lying on your back to sitting on the side of a flat bed without using bedrails?: A  Little Help needed moving to and from a bed to a chair (including a wheelchair)?: A Little Help needed standing up from a chair using your arms (e.g., wheelchair or bedside chair)?: A Little Help needed to walk in hospital room?: A Little Help needed climbing 3-5 steps with a railing? : A Little 6 Click Score: 19    End of Session   Activity Tolerance: Patient tolerated treatment well Patient left: in chair;with call bell/phone within reach Nurse Communication: Mobility status PT Visit Diagnosis: Unsteadiness on feet (R26.81);Other abnormalities of gait and mobility (R26.89);Muscle weakness (generalized) (M62.81)     Time: 8775-8747 PT Time Calculation (min) (ACUTE ONLY): 28 min  Charges:    $Therapeutic Activity: 23-37 mins PT General Charges $$ ACUTE PT VISIT: 1 Visit                     Aleck Daring, PT, DPT Acute Rehabilitation Services Office [phone]    Aleck ONEIDA Daring 12/30/2023, 1:47 PM

## 2023-12-30 NOTE — Progress Notes (Addendum)
 Pt has been approved a MWF Schedule, 12pm chair time at [facility] [phone] [facility] [address]). Pt will have a start of 10/13 and will need to arrive 12pm.   Met bedside to discuss this with pt. Pt hopeful that he will not need out-pt HD due to recovery, navigator informed that if he does happen to, he has a chair in [place] now. Pt agreeable to schedule at this time and presented with no questions or concerns. Confirmed this with clinic, he is good to start Monday. Contacted sister Lucian at this time via phone, no answer. Confidential VM left at this time with update. Care team has been informed at this time. AVS has ben updated with upcoming out-pt HD appt info. Will continue to assist  [facility] Marisah Laker Dialysis Navigator\ [phone]

## 2023-12-30 NOTE — Plan of Care (Signed)
  Problem: Education: Goal: Knowledge of the procedure and recovery process will improve Outcome: Progressing   Problem: Bowel/Gastric: Goal: Gastrointestinal status for postoperative course will improve Outcome: Progressing   Problem: Urinary Elimination: Goal: Ability to avoid or minimize complications of infection will improve Outcome: Progressing

## 2023-12-31 DIAGNOSIS — K651 Peritoneal abscess: Secondary | ICD-10-CM | POA: Diagnosis not present

## 2023-12-31 LAB — RENAL FUNCTION PANEL
Albumin: 2.2 g/dL — ABNORMAL LOW (ref 3.5–5.0)
Anion gap: 19 — ABNORMAL HIGH (ref 5–15)
BUN: 124 mg/dL — ABNORMAL HIGH (ref 6–20)
CO2: 18 mmol/L — ABNORMAL LOW (ref 22–32)
Calcium: 8 mg/dL — ABNORMAL LOW (ref 8.9–10.3)
Chloride: 91 mmol/L — ABNORMAL LOW (ref 98–111)
Creatinine, Ser: 5.12 mg/dL — ABNORMAL HIGH (ref 0.61–1.24)
GFR, Estimated: 12 mL/min — ABNORMAL LOW (ref >60)
Glucose, Bld: 119 mg/dL — ABNORMAL HIGH (ref 70–99)
Phosphorus: 10 mg/dL — ABNORMAL HIGH (ref 2.5–4.6)
Potassium: 4.1 mmol/L (ref 3.5–5.1)
Sodium: 128 mmol/L — ABNORMAL LOW (ref 135–145)

## 2023-12-31 LAB — GLUCOSE, CAPILLARY
Glucose-Capillary: 98 mg/dL (ref 70–99)
Glucose-Capillary: 113 mg/dL — ABNORMAL HIGH (ref 70–99)
Glucose-Capillary: 146 mg/dL — ABNORMAL HIGH (ref 70–99)
Glucose-Capillary: 150 mg/dL — ABNORMAL HIGH (ref 70–99)
Glucose-Capillary: 125 mg/dL — ABNORMAL HIGH (ref 70–99)

## 2023-12-31 MED ORDER — SEVELAMER CARBONATE 800 MG PO TABS
2400.0000 mg | ORAL_TABLET | Freq: Three times a day (TID) | ORAL | Status: DC
  Administered 2023-12-31 – 2024-01-02 (×6): 2400 mg via ORAL
  Filled 2023-12-31 (×6): qty 3

## 2023-12-31 NOTE — Progress Notes (Signed)
 Admit: 12/16/2023 LOS: 15  72M with AKI after presenting with encephalopathy, A-fib with RVR/ventricular tachycardia, found to have likely abdominal urinomas with abscess, now requiring RRT TTS with some signs of renal recovery.    Subjective:  UOP 1700 mL  Clearance continues to lag - HD today No new issues in good spirits    10/10 0701 - 10/11 0700 In: 1300 [P.O.:1300] Out: 1700 [Urine:1700]  Filed Weights   12/29/23 0847 12/29/23 1219 12/31/23 0815  Weight: 93.4 kg 91.4 kg 91.4 kg    Scheduled Meds:  (feeding supplement) PROSource Plus  30 mL Oral TID AC   amLODipine   5 mg Oral Daily   atorvastatin   80 mg Oral Daily   bisacodyl   10 mg Rectal Daily   carvedilol  12.5 mg Oral BID WC   Chlorhexidine  Gluconate Cloth  6 each Topical Daily   feeding supplement  237 mL Oral BID BM   Gerhardt's butt cream   Topical BID   heparin  injection (subcutaneous)  5,000 Units Subcutaneous Q8H   leptospermum manuka honey  1 Application Topical Daily   metroNIDAZOLE   500 mg Oral Q12H   multivitamin  1 tablet Oral QHS   sevelamer carbonate  1,600 mg Oral TID WC   sodium chloride  flush  10-40 mL Intracatheter Q12H   sodium chloride  flush  5 mL Intracatheter Q8H   Continuous Infusions:   ceFAZolin  (ANCEF ) IV 1 g (12/30/23 2259)   PRN Meds:.ALPRAZolam , mouth rinse, sodium chloride  flush  Current Labs: reviewed    Physical Exam:  Blood pressure 120/76, pulse 94, temperature 98 F (36.7 C), resp. rate (!) 22, height 5' 5 (1.651 m), weight 91.4 kg, SpO2 100%.  GEN: NAD, on RA Neck: R TDC c/d/ CV: Regular, normal S1 and S2 PULM: BLBS ABD: soft EXT: no LEE   A AKI, initially anuric, baseline normal GFR.  No evidence of obstruction on imaging at presentation.  Suspect ATN.  Repeat bladder pressure not consistent with abdominal compartment syndrome on 9/27.  CT A/P w contrast, 10/1: Renal unremarkable. Now nonoliguric.  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 Urology f/u outpt.  Ileus versus SBO, surgery following, ileus favored and now resolved VDRF resolved A-fib with RVR and V. tach at presentation, cardiology following on amiodarone  Septic shock, resolved Hypertension: On amlodipine  5 daily, coreg 12.5 BID.  BP somewhat labile but generally ok Hyperphosphatemia setting of AKI on phos binder Metabolic acidosis, improving on HD. Anemia: mild Hb 10s   P Stopped CRRT 9/30.  1st HD, 10/1, last 10/11 Cont daily labs and UOP assessment - based on great UOP looks like he's recovering but clearance lagging Outpatient AKI HD CLIP underway while we concurrently monitor for renal recovery - d/c planning complex as going to [facility] I'm told; pt doesn't seem to have a clear picture of where he's going Increase binder for phos 10 Daily weights, Daily Renal Panel, Strict I/Os, Avoid nephrotoxins (NSAIDs, judicious IV Contrast)  Medication Issues; Preferred narcotic agents for pain control are hydromorphone , fentanyl , and methadone. Morphine  should not be used.  Baclofen should be avoided Avoid oral sodium phosphate  and magnesium  citrate based laxatives / bowel preps     Dr. Manuelita DELENA Barters  12/31/2023, 10:01 AM  Recent Labs  Lab 12/29/23 0221 12/30/23 0721 12/31/23 0604  NA 129* 130* 128*  K 4.2 4.5 4.1  CL 92* 90* 91*  CO2 17* 17* 18*  GLUCOSE  111* 107* 119*  BUN 135* 117* 124*  CREATININE 6.28* 5.25* 5.12*  CALCIUM  7.7* 7.8* 8.0*  PHOS 10.2* 9.7* 10.0*   Recent Labs  Lab 12/28/23 0232  WBC 11.0*  HGB 10.8*  HCT 33.3*  MCV 82.4  PLT 411*

## 2023-12-31 NOTE — Progress Notes (Signed)
 PROGRESS NOTE    Glenn Stewart  FMW:[id number] DOB: 18-May-1965 DOA: 12/16/2023 PCP: Pcp, No  Subjective: No new complaints.  Summary/Hospital Course: 16 yoM with PMH of HTN, CAD, CABG x 5 04/2022 with post op Afib since stopped on Eliquis  and amiodarone  09/2022, HTN, HLD, GERD, NASH, bipolar/ ADHD and s/p recent robotic assisted laparoscopic radial prostatectomy on 11/17/23 who presented for weakness and waxing mental status with distended and tender abdominal by EMS found with episodes of Afib with RVR and VT.  Was hemodynamically unstable on arrival, underwent emergent cardioversion and given amiodarone  x2 and magnesium , with emerging rhythm of afib with RVR possible flutter.  Cardiology consulted. Post cardioversion became hypotensive with respiratory distress therefore intubated and placed on norepinephrine .  Difficulty oxygenating post intubation with normal PIP requiring 1.0 and 12 PEEP.  Labs noted for K2.8, Na 129, bicarb 16, BUN/ sCr 55/ 3.92 (recently  ), t. Bili 3.4, AST 68, albumin  1.6, protein 3.4, INR 1.2.  CTH neg, CTA PE and CT a/p w/contrast pending official read.  2.1L of brownish OGT contents s/p OGT placement. Cultures sent, empiric zosyn / vanc started.  Afebrile, anuric and since weaned off pressors.  R internal jugular CVL placed by EDP.  PCCM consulted for admission.      patient self-irrigated catheter a couple days after initial surgery and then removed catheter himself prior to follow-up at 1 week. Patient reported that he cut tubing prior to removal but unable to confirm if removal was truly atraumat    Significant Events: Admitted 12/16/2023 to ICU by PCCM for septic shock, respiratory failure with hypoxia, intra-abdominal abscesses 12-16-2023 intubated in ER. Emergently cardioverted due to sustained ventricular tachycardia. Due to hypotension, started on IV levophed  09/27 s/p IR drainage of RLQ/pelvic collection (Cx abundant staph); 300 mL  9/28 - On vent. Ongoing  CRRT. Seen by surgery team - still concernd about Urinoma and secondary bowel irritation and not a true obstruction. NGT output is low c/w ileus. CCS does  NOT want enteral medication. Recommnding max only trophic feds. CUlture negative. Afebrile.  Stopped on Zyvox  and Zosyn  9/29 SBO Xr protocol per Gen Surgery with oral contrast pooling in the gastric fundus. MSSA in abdominal cultures and Enterobacter cloacae in Ucx - on Cefepime and Flagyl  SBT trial failed secondary to RR >40 and increased WOB. Back on PRVC 9/30 discontinued CRRT.  10/1 - switched to meropenem. Repeat CT A/P with stable intraabdominal abscesses. Started iHD 10/03 liberated from mechanical ventilation.  10/05 transfer to [facility].  10/06 plan for HD tunneled catheter  10/97 rectal tube has been removed.    12/31/2023: Patient seen.  No new complaints.  Patient just had dialysis with no ultrafiltration.  Patient tells me that he is now planning to move to [place].  Some renal recovery noted by the nephrology team.  Admission Labs: Na 129, K 2.8, CO2 of 16, BUN 55, Scr 3.92, glu 118, Ca 6.3 T. Prot 5.1, alb 1.6, AST 68, ALT 37, alk phos 69, t. Bili 3.4,  WBC 6.0, HgB 12.6, Plt 324 Triglycerides 145 Urine drug screen negative UA cloudy, moderate HgB, negative nitrite, Large LE, WBC >50, rare bacteria ABG pH 7.219, PCO2 of 54, PO2 of 74   Admission Imaging Studies: CXR Well-positioned endotracheal and esophagogastric tubes. No pneumothorax. 2. Markedly low lung volumes.  No pneumonia or pleural effusion. CT head No acute intracranial pathology.  CTPA, CT abd/pelvis No evidence of significant pulmonary embolus. Fluid-filled distended small bowel with decompressed terminal ileum  consistent with small-bowel obstruction. 2. Irregular wall thickening diffusely throughout the distal small bowel and right colon suggesting enterocolitis or inflammatory bowel disease. Inflammatory strictures may be present. 3. Multiple loculated fluid  collections, some with gas, demonstrated in the right abdomen and pelvis indicating abscesses. This is likely due to perforation, possibly related to inflammatory bowel disease or to an occult perforated appendicitis. The appendix is self is not demonstrated. No free intra-abdominal air. 4. Small amount of free fluid in the abdomen is likely reactive. 5. Cholelithiasis without evidence of acute cholecystitis. 6. Aortic atherosclerosis. 7. Consolidation versus compressive atelectasis in both lung bases. Small bilateral pleural effusions. CT cystogram CT cystogram with Foley catheter in place. Contrast material leaks along the catheter through the urethra. There is focal irregularity at the junction of the bladder base and urethra with small anterior contrast pooling suggesting contained perforation. No intraperitoneal contrast extravasation. 2. Appearance examination is otherwise unchanged. Again demonstrated are multiple loculated fluid collections in the abdomen and pelvis consistent with abscesses. Wall thickening of the cecum and terminal ileum. Fluid-filled dilated small bowel appear mildly improved.   Significant Labs: 12-16-2023 urine cx enterobacter cloacae 12-17-2023 abscess culture. MSSA Staph aureus   Significant Imaging Studies:     Antibiotic Therapy: Anti-infectives (From admission, onward)      Start     Dose/Rate Route Frequency Ordered Stop    12/21/23 1600   meropenem (MERREM) 1 g in sodium chloride  0.9 % 100 mL IVPB        1 g 200 mL/hr over 30 Minutes Intravenous Every 24 hours 12/21/23 0853 12/28/23 2359    12/19/23 1045   ceFEPIme (MAXIPIME) 2 g in sodium chloride  0.9 % 100 mL IVPB  Status:  Discontinued        2 g 200 mL/hr over 30 Minutes Intravenous Every 12 hours 12/19/23 0955 12/21/23 0843    12/19/23 1045   metroNIDAZOLE  (FLAGYL ) IVPB 500 mg  Status:  Discontinued        500 mg 100 mL/hr over 60 Minutes Intravenous Every 12 hours 12/19/23 0955 12/21/23 0843     12/17/23 1600   piperacillin -tazobactam (ZOSYN ) IVPB 2.25 g  Status:  Discontinued        2.25 g 100 mL/hr over 30 Minutes Intravenous Every 8 hours 12/17/23 0931 12/17/23 1144    12/17/23 1600   piperacillin -tazobactam (ZOSYN ) IVPB 3.375 g  Status:  Discontinued        3.375 g 100 mL/hr over 30 Minutes Intravenous Every 6 hours 12/17/23 1157 12/19/23 0955    12/17/23 1230   piperacillin -tazobactam (ZOSYN ) IVPB 3.375 g  Status:  Discontinued        3.375 g 100 mL/hr over 30 Minutes Intravenous Every 6 hours 12/17/23 1144 12/17/23 1157    12/17/23 1000   linezolid  (ZYVOX ) IVPB 600 mg  Status:  Discontinued        600 mg 300 mL/hr over 60 Minutes Intravenous Every 12 hours 12/16/23 1924 12/19/23 0958    12/17/23 0000   piperacillin -tazobactam (ZOSYN ) IVPB 3.375 g  Status:  Discontinued        3.375 g 12.5 mL/hr over 240 Minutes Intravenous Every 8 hours 12/16/23 1924 12/17/23 0931    12/16/23 1600   piperacillin -tazobactam (ZOSYN ) IVPB 3.375 g        3.375 g 12.5 mL/hr over 240 Minutes Intravenous  Once 12/16/23 1551 12/16/23 1602    12/16/23 1600   vancomycin  (VANCOREADY) IVPB 1750 mg/350 mL  1,750 mg 175 mL/hr over 120 Minutes Intravenous  Once 12/16/23 1551 12/16/23 1816           Procedures: 12-16-2023 intubation, cardioversion, Right internal jugular CVL, arterial line insertion 12-17-2023 IR drain placement into RLQ abd abscess   Consultants: PCCM Cardiology Urology General surgery ID       Assessment and Plan: * Intra-abdominal abscess (HCC) Prior to 12/28/23 Septic shock resolved.  Multiple abdominal abscesses due to MSSA.  Urinary tract infection due to enterobacter cloacae.  Initially placed on metronidazole  and cefepime, then because persistent fever has been changed to meropenem.  Follow up CT abdomen and pelvis with stable or improvement in abdominal abscess.   Follow up cell count is 11.0  Cultures from blood have been no growth.   Plan to  continue antibiotic therapy with meropenem (started on 10/01).  Will plan for now to continue antibiotic therapy until 10/08   12/28/23 pt has completed 10 days of Cefepime/Meropenem for enterobacter cloacae abscess. Asking ID if he needs more IV/PO abx. Asking urology for input about his intra-abd drains that were placed by IR.  General surgery signed off on 12-26-2023.  12/29/23 ID consulted yesterday to come up with a plan for abx therapy.  Still has abd drain. Pt states urology going to remove his foley catheter today. 12/31/2023: Antibiotics as per infectious disease.  Pressure injury of buttock, unstageable (HCC) 12/28/23 Noted by wound care care on 12-26-2023. Previously noted Deep tissue pressure injury is beginning to evolve into an Unstageable pressure injury with areas of slough to the edges across bilat buttocks/sacrum, approx 6X6 cm. Was not present on admission.   Class 2 obesity with body mass index (BMI) of 35 to 39.9 without comorbidity Body mass index is 34.27 kg/m.   Acute renal failure on dialysis Prior to 12/28/23  ATN. Hyponatremia hyperkalemia.  09/30 Stopped CRRT  10/01 started intermittent hemodialysis.   Urine output is 2250 cc (foley in place)  BUN 143, cr 7,44, with K at 4.7 and serum bicarbonate at 17  Na 130   Plan for tunneled HD access.    Patient continue renal replacement therapy with hemodialysis, per nephrology recommendations   12/29/23 remains on HD. Showing some signs of renal recovery. Produced 3.4L of urine. He now has right internal jugular permcath. Renal coordinator trying to figure out where pt is going to get outpt HD if he goes to [place] and lives with his mother.  12/30/2023: No hemodialysis today.  From nephrology team is noted.  Patient is nonoliguric.  Impaired clearance.  Continue to assess daily. 01/31/2024: Patient underwent hemodialysis with no ultrafiltration.  Patient tells me that he is now moving to [place].   Prostate  cancer [facility]) Prior to 12/28/23 Sp robotic assisted laparoscopic prostatectomy on 11/17/23 Continue foley catheter, plan to repeat cystogram 10/7 to 10/10 per urology.   12/28/23 urology following  Coronary artery disease 12/28/23 No chest pain, no acute coronary syndrome.   12/29/23 stable.   Essential hypertension 12/28/23 Continue blood pressure control with amlodipine  and carvedilol.   12/29/23 stable on norvasc  and coreg.   Atrial fibrillation with RVR (HCC)-resolved as of 12/28/2023  >>ASSESSMENT AND PLAN FOR PAF (PAROXYSMAL ATRIAL FIBRILLATION) (HCC) WRITTEN ON 12/28/2023  1:46 PM BY CHEN, ERIC, DO  Prior to 12/28/23 Ventricular tachycardia sp cardioversion.   Continue with carvedilol 12.5 mg po bid.  Patient not on anticoagulation. Continue telemetry monitoring EP has been consulted with recommendations to stop amiodarone  and continue close monitoring.  12/28/23 cards/EP felt that pt's VT was due to his sepsis. Amio stopped. On coreg 12.5 mg bid. Ziopatch ordered to be placed prior to discharge.   VT (ventricular tachycardia) (HCC)-resolved as of 12/28/2023 Prior to 12/28/23 Ventricular tachycardia sp cardioversion.   Continue with carvedilol 12.5 mg po bid.  Patient not on anticoagulation. Continue telemetry monitoring EP has been consulted with recommendations to stop amiodarone  and continue close monitoring.   12/28/23 cards/EP felt that pt's VT was due to his sepsis. Amio stopped. On coreg 12.5 mg bid. Ziopatch ordered to be placed prior to discharge.   Acute metabolic encephalopathy-resolved as of 12/28/2023 Prior to 12/28/23 Mentation has been improving, continue with mild confusion but not agitation. Continue nutritional support, PT and OT.  Neuro checks per unit protocol.   12/28/23 resolved.   Bilateral pneumonia-resolved as of 12/28/2023 Prior to 12/28/23 (Present on admission)  Acute hypoxemic and hypercapnic respiratory failure.  Moderate ARDS.   Required invasive mechanical ventilation.   Completed antibiotic therapy 02 saturation today is 100% on room air.   12/28/23 resolved.   Ileus (HCC)-resolved as of 12/28/2023 Prior to 12/28/23 Patient has been weaned off TPN and now tolerating po well.  Ileus has resolved, advanced to soft today.  Surgery has signed off 12-26-2023  DVT prophylaxis: heparin  injection 5,000 Units Start: 12/17/23 1400 SCDs Start: 12/16/23 1727    Code Status: Full Code Family Communication:  Disposition Plan: Unclear  Objective: Vitals:   12/31/23 1300 12/31/23 1305 12/31/23 1311 12/31/23 1332  BP: 120/76 120/76 128/69   Pulse: 94 100 88   Resp: (!) 21 (!) 23 (!) 21   Temp:  (!) 97.3 F (36.3 C) (!) 97.5 F (36.4 C) 98 F (36.7 C)  TempSrc:      SpO2: 97% 97% 96%   Weight:   91.4 kg 91.4 kg  Height:        Intake/Output Summary (Last 24 hours) at 12/31/2023 1403 Last data filed at 12/31/2023 1311 Gross per 24 hour  Intake 500 ml  Output 1000 ml  Net -500 ml   Filed Weights   12/31/23 0815 12/31/23 1311 12/31/23 1332  Weight: 91.4 kg 91.4 kg 91.4 kg    Examination:  Physical Exam Vitals and nursing note reviewed.  Constitutional:      General: He is not in acute distress.    Appearance: He is not ill-appearing, toxic-appearing or diaphoretic.  HENT:     Right Ear: External ear normal.     Left Ear: External ear normal.     Nose: Nose normal.     Mouth/Throat:     Mouth: Mucous membranes are moist.     Pharynx: Oropharynx is clear.  Eyes:     Extraocular Movements: Extraocular movements intact.     Pupils: Pupils are equal, round, and reactive to light.     Comments: Patient is pale.  Cardiovascular:     Heart sounds: Normal heart sounds.  Pulmonary:     Effort: Pulmonary effort is normal.     Breath sounds: Normal breath sounds.  Abdominal:     General: Bowel sounds are normal.  Genitourinary:    Comments: +foley Musculoskeletal:     Cervical back: Normal range  of motion and neck supple.  Skin:    Comments: Right ant chest wall permcath  Neurological:     Comments: Awake and alert.     Data Reviewed: I have personally reviewed following labs and imaging studies  CBC: Recent Labs  Lab  12/28/23 0232  WBC 11.0*  HGB 10.8*  HCT 33.3*  MCV 82.4  PLT 411*   Basic Metabolic Panel: Recent Labs  Lab 12/25/23 0630 12/26/23 0316 12/27/23 0813 12/28/23 0232 12/29/23 0221 12/30/23 0721 12/31/23 0604  NA 135 133* 130* 130* 129* 130* 128*  K 4.1 4.5 4.7 5.1 4.2 4.5 4.1  CL 95* 95* 95* 92* 92* 90* 91*  CO2 23 20* 17* 18* 17* 17* 18*  GLUCOSE 113* 99 122* 126* 111* 107* 119*  BUN 89* 114* 143* 125* 135* 117* 124*  CREATININE 5.56* 6.71* 7.44* 6.26* 6.28* 5.25* 5.12*  CALCIUM  7.8* 8.0* 7.8* 7.4* 7.7* 7.8* 8.0*  MG 2.2 2.4  --   --   --   --   --   PHOS 6.4* 8.9* 11.4*  --  10.2* 9.7* 10.0*   GFR: Estimated Creatinine Clearance: 16.1 mL/min (A) (by C-G formula based on SCr of 5.12 mg/dL (H)). Liver Function Tests: Recent Labs  Lab 12/25/23 0630 12/26/23 0316 12/27/23 0813 12/29/23 0221 12/30/23 0721 12/31/23 0604  AST 30 39  --   --   --   --   ALT 20 27  --   --   --   --   ALKPHOS 82 92  --   --   --   --   BILITOT 0.9 1.1  --   --   --   --   PROT 5.9* 6.6  --   --   --   --   ALBUMIN  1.6* 1.9* 2.0* 2.0* 2.2* 2.2*   CBG: Recent Labs  Lab 12/30/23 1203 12/30/23 1639 12/30/23 1950 12/30/23 2328 12/31/23 0429  GLUCAP 106* 126* 122* 131* 98   No results found for this or any previous visit (from the past 240 hours).  Radiology Studies: No results found.  Scheduled Meds:  (feeding supplement) PROSource Plus  30 mL Oral TID AC   amLODipine   5 mg Oral Daily   atorvastatin   80 mg Oral Daily   bisacodyl   10 mg Rectal Daily   carvedilol  12.5 mg Oral BID WC   Chlorhexidine  Gluconate Cloth  6 each Topical Daily   feeding supplement  237 mL Oral BID BM   Gerhardt's butt cream   Topical BID   heparin  injection  (subcutaneous)  5,000 Units Subcutaneous Q8H   leptospermum manuka honey  1 Application Topical Daily   metroNIDAZOLE   500 mg Oral Q12H   multivitamin  1 tablet Oral QHS   sevelamer carbonate  2,400 mg Oral TID WC   sodium chloride  flush  10-40 mL Intracatheter Q12H   sodium chloride  flush  5 mL Intracatheter Q8H   Continuous Infusions:   ceFAZolin  (ANCEF ) IV 1 g (12/30/23 2259)     LOS: 15 days   Time spent: 35 minutes  Leatrice LILLETTE Chapel, MD  [facility]  12/31/2023, 2:03 PM

## 2023-12-31 NOTE — Plan of Care (Signed)
  Problem: Bowel/Gastric: Goal: Gastrointestinal status for postoperative course will improve Outcome: Progressing   Problem: Urinary Elimination: Goal: Ability to achieve and maintain urine output will improve Outcome: Progressing Goal: Home care management will improve Outcome: Progressing

## 2023-12-31 NOTE — Progress Notes (Signed)
 Hemodialysis completed, 0 ultrafiltration, awaiting primary RN to return call for report [name] , patient returned to his room condition stable without complaints.

## 2024-01-01 DIAGNOSIS — K651 Peritoneal abscess: Secondary | ICD-10-CM | POA: Diagnosis not present

## 2024-01-01 LAB — BASIC METABOLIC PANEL WITH GFR
Anion gap: 11 (ref 5–15)
BUN: 63 mg/dL — ABNORMAL HIGH (ref 6–20)
CO2: 27 mmol/L (ref 22–32)
Calcium: 7.9 mg/dL — ABNORMAL LOW (ref 8.9–10.3)
Chloride: 93 mmol/L — ABNORMAL LOW (ref 98–111)
Creatinine, Ser: 3.58 mg/dL — ABNORMAL HIGH (ref 0.61–1.24)
GFR, Estimated: 19 mL/min — ABNORMAL LOW (ref >60)
Glucose, Bld: 138 mg/dL — ABNORMAL HIGH (ref 70–99)
Potassium: 4.3 mmol/L (ref 3.5–5.1)
Sodium: 131 mmol/L — ABNORMAL LOW (ref 135–145)

## 2024-01-01 LAB — GLUCOSE, CAPILLARY
Glucose-Capillary: 137 mg/dL — ABNORMAL HIGH (ref 70–99)
Glucose-Capillary: 112 mg/dL — ABNORMAL HIGH (ref 70–99)
Glucose-Capillary: 120 mg/dL — ABNORMAL HIGH (ref 70–99)
Glucose-Capillary: 107 mg/dL — ABNORMAL HIGH (ref 70–99)
Glucose-Capillary: 113 mg/dL — ABNORMAL HIGH (ref 70–99)
Glucose-Capillary: 109 mg/dL — ABNORMAL HIGH (ref 70–99)

## 2024-01-01 NOTE — Progress Notes (Addendum)
 Admit: 12/16/2023 LOS: 16  50M with AKI after presenting with encephalopathy, A-fib with RVR/ventricular tachycardia, found to have likely abdominal urinomas with abscess, now requiring RRT TTS with some signs of renal recovery.    Subjective:  UOP 1350 mL  Clearance continues to lag so he had HD yesterday No new issues in good spirits    10/11 0701 - 10/12 0700 In: 560 [P.O.:560] Out: 1350 [Urine:1350]  Filed Weights   12/31/23 0815 12/31/23 1311 12/31/23 1332  Weight: 91.4 kg 91.4 kg 91.4 kg    Scheduled Meds:  (feeding supplement) PROSource Plus  30 mL Oral TID AC   amLODipine   5 mg Oral Daily   atorvastatin   80 mg Oral Daily   bisacodyl   10 mg Rectal Daily   carvedilol  12.5 mg Oral BID WC   Chlorhexidine  Gluconate Cloth  6 each Topical Daily   feeding supplement  237 mL Oral BID BM   Gerhardt's butt cream   Topical BID   heparin  injection (subcutaneous)  5,000 Units Subcutaneous Q8H   leptospermum manuka honey  1 Application Topical Daily   metroNIDAZOLE   500 mg Oral Q12H   multivitamin  1 tablet Oral QHS   sevelamer carbonate  2,400 mg Oral TID WC   sodium chloride  flush  10-40 mL Intracatheter Q12H   sodium chloride  flush  5 mL Intracatheter Q8H   Continuous Infusions:   ceFAZolin  (ANCEF ) IV 1 g (01/01/24 0921)   PRN Meds:.ALPRAZolam , mouth rinse, sodium chloride  flush  Current Labs: reviewed    Physical Exam:  Blood pressure 117/72, pulse 99, temperature 98.2 F (36.8 C), temperature source Oral, resp. rate 20, height 5' 5 (1.651 m), weight 91.4 kg, SpO2 100%.  GEN: NAD, on RA Neck: R TDC c/d/ CV: Regular, normal S1 and S2 PULM: BLBS ABD: soft EXT: no LEE   A AKI, initially anuric, baseline normal GFR.  No evidence of obstruction on imaging at presentation.  Suspect ATN.  Repeat bladder pressure not consistent with abdominal compartment syndrome on 9/27.  CT A/P w contrast, 10/1: Renal unremarkable. Now nonoliguric.  Hyponatremia, mild Abdominal  fluid collections concerning for urinoma after radical prostatectomy 11/14/2023; status post drain placement 9/27 by IR. Urology f/u outpt.  Ileus versus SBO, surgery following, ileus favored and now resolved VDRF resolved A-fib with RVR and V. tach at presentation, cardiology following on amiodarone  Septic shock, resolved Hypertension: On amlodipine  5 daily, coreg 12.5 BID.  BP somewhat labile but generally ok Anemia: mild Hb 10s   P Stopped CRRT 9/30.  1st HD, 10/1, last 10/11, next tentatively 10/14 Cont daily labs and UOP assessment - based on great UOP looks like he's recovering but clearance lagging Outpatient AKI HD CLIP underway while we concurrently monitor for renal recovery - he has a MWF HD chair in [facility] but pt doesn't seem to have a clear picture of where he's going - home vs [facility]T - can be further discussed w SW tomorrow.  We can switch his HD to MWF  closer to d/c  Increased binder for phos 10, on renal diet Daily weights, Daily Renal Panel, Strict I/Os, Avoid nephrotoxins (NSAIDs, judicious IV Contrast)  Medication Issues; Preferred narcotic agents for pain control are hydromorphone , fentanyl , and methadone. Morphine  should not be used.  Baclofen should be avoided Avoid oral sodium phosphate  and magnesium  citrate based laxatives / bowel preps     Dr. Manuelita DELENA Barters  01/01/2024, 12:52 PM  Recent Labs  Lab 12/29/23 0221 12/30/23 9278 12/31/23  0604 01/01/24 0444  NA 129* 130* 128* 131*  K 4.2 4.5 4.1 4.3  CL 92* 90* 91* 93*  CO2 17* 17* 18* 27  GLUCOSE 111* 107* 119* 138*  BUN 135* 117* 124* 63*  CREATININE 6.28* 5.25* 5.12* 3.58*  CALCIUM  7.7* 7.8* 8.0* 7.9*  PHOS 10.2* 9.7* 10.0*  --    Recent Labs  Lab 12/28/23 0232  WBC 11.0*  HGB 10.8*  HCT 33.3*  MCV 82.4  PLT 411*

## 2024-01-01 NOTE — Progress Notes (Signed)
 PROGRESS NOTE    Glenn Stewart  FMW:[id number] DOB: 04-03-1965 DOA: 12/16/2023 PCP: Pcp, No  Subjective: No new complaints.  Summary/Hospital Course: 13 yoM with PMH of HTN, CAD, CABG x 5 04/2022 with post op Afib since stopped on Eliquis  and amiodarone  09/2022, HTN, HLD, GERD, NASH, bipolar/ ADHD and s/p recent robotic assisted laparoscopic radial prostatectomy on 11/17/23 who presented for weakness and waxing mental status with distended and tender abdominal by EMS found with episodes of Afib with RVR and VT.  Was hemodynamically unstable on arrival, underwent emergent cardioversion and given amiodarone  x2 and magnesium , with emerging rhythm of afib with RVR possible flutter.  Cardiology consulted. Post cardioversion became hypotensive with respiratory distress therefore intubated and placed on norepinephrine .  Difficulty oxygenating post intubation with normal PIP requiring 1.0 and 12 PEEP.  Labs noted for K2.8, Na 129, bicarb 16, BUN/ sCr 55/ 3.92 (recently  ), t. Bili 3.4, AST 68, albumin  1.6, protein 3.4, INR 1.2.  CTH neg, CTA PE and CT a/p w/contrast pending official read.  2.1L of brownish OGT contents s/p OGT placement. Cultures sent, empiric zosyn / vanc started.  Afebrile, anuric and since weaned off pressors.  R internal jugular CVL placed by EDP.  PCCM consulted for admission.      patient self-irrigated catheter a couple days after initial surgery and then removed catheter himself prior to follow-up at 1 week. Patient reported that he cut tubing prior to removal but unable to confirm if removal was truly atraumat    Significant Events: Admitted 12/16/2023 to ICU by PCCM for septic shock, respiratory failure with hypoxia, intra-abdominal abscesses 12-16-2023 intubated in ER. Emergently cardioverted due to sustained ventricular tachycardia. Due to hypotension, started on IV levophed  09/27 s/p IR drainage of RLQ/pelvic collection (Cx abundant staph); 300 mL  9/28 - On vent. Ongoing  CRRT. Seen by surgery team - still concernd about Urinoma and secondary bowel irritation and not a true obstruction. NGT output is low c/w ileus. CCS does  NOT want enteral medication. Recommnding max only trophic feds. CUlture negative. Afebrile.  Stopped on Zyvox  and Zosyn  9/29 SBO Xr protocol per Gen Surgery with oral contrast pooling in the gastric fundus. MSSA in abdominal cultures and Enterobacter cloacae in Ucx - on Cefepime and Flagyl  SBT trial failed secondary to RR >40 and increased WOB. Back on PRVC 9/30 discontinued CRRT.  10/1 - switched to meropenem. Repeat CT A/P with stable intraabdominal abscesses. Started iHD 10/03 liberated from mechanical ventilation.  10/05 transfer to [facility].  10/06 plan for HD tunneled catheter  10/97 rectal tube has been removed.    12/31/2023: Patient seen.  No new complaints.  Patient just had dialysis with no ultrafiltration.  Patient tells me that he is now planning to move to [place].  Some renal recovery noted by the nephrology team.  01/01/2024: Patient seen.  No new complaints.  Awaiting disposition.  Patient maintains that he is not planning to move to [place].  Admission Labs: Na 129, K 2.8, CO2 of 16, BUN 55, Scr 3.92, glu 118, Ca 6.3 T. Prot 5.1, alb 1.6, AST 68, ALT 37, alk phos 69, t. Bili 3.4,  WBC 6.0, HgB 12.6, Plt 324 Triglycerides 145 Urine drug screen negative UA cloudy, moderate HgB, negative nitrite, Large LE, WBC >50, rare bacteria ABG pH 7.219, PCO2 of 54, PO2 of 74   Admission Imaging Studies: CXR Well-positioned endotracheal and esophagogastric tubes. No pneumothorax. 2. Markedly low lung volumes.  No pneumonia or pleural effusion. CT  head No acute intracranial pathology.  CTPA, CT abd/pelvis No evidence of significant pulmonary embolus. Fluid-filled distended small bowel with decompressed terminal ileum consistent with small-bowel obstruction. 2. Irregular wall thickening diffusely throughout the distal small bowel and  right colon suggesting enterocolitis or inflammatory bowel disease. Inflammatory strictures may be present. 3. Multiple loculated fluid collections, some with gas, demonstrated in the right abdomen and pelvis indicating abscesses. This is likely due to perforation, possibly related to inflammatory bowel disease or to an occult perforated appendicitis. The appendix is self is not demonstrated. No free intra-abdominal air. 4. Small amount of free fluid in the abdomen is likely reactive. 5. Cholelithiasis without evidence of acute cholecystitis. 6. Aortic atherosclerosis. 7. Consolidation versus compressive atelectasis in both lung bases. Small bilateral pleural effusions. CT cystogram CT cystogram with Foley catheter in place. Contrast material leaks along the catheter through the urethra. There is focal irregularity at the junction of the bladder base and urethra with small anterior contrast pooling suggesting contained perforation. No intraperitoneal contrast extravasation. 2. Appearance examination is otherwise unchanged. Again demonstrated are multiple loculated fluid collections in the abdomen and pelvis consistent with abscesses. Wall thickening of the cecum and terminal ileum. Fluid-filled dilated small bowel appear mildly improved.   Significant Labs: 12-16-2023 urine cx enterobacter cloacae 12-17-2023 abscess culture. MSSA Staph aureus   Significant Imaging Studies:     Antibiotic Therapy: Anti-infectives (From admission, onward)      Start     Dose/Rate Route Frequency Ordered Stop    12/21/23 1600   meropenem (MERREM) 1 g in sodium chloride  0.9 % 100 mL IVPB        1 g 200 mL/hr over 30 Minutes Intravenous Every 24 hours 12/21/23 0853 12/28/23 2359    12/19/23 1045   ceFEPIme (MAXIPIME) 2 g in sodium chloride  0.9 % 100 mL IVPB  Status:  Discontinued        2 g 200 mL/hr over 30 Minutes Intravenous Every 12 hours 12/19/23 0955 12/21/23 0843    12/19/23 1045   metroNIDAZOLE  (FLAGYL ) IVPB  500 mg  Status:  Discontinued        500 mg 100 mL/hr over 60 Minutes Intravenous Every 12 hours 12/19/23 0955 12/21/23 0843    12/17/23 1600   piperacillin -tazobactam (ZOSYN ) IVPB 2.25 g  Status:  Discontinued        2.25 g 100 mL/hr over 30 Minutes Intravenous Every 8 hours 12/17/23 0931 12/17/23 1144    12/17/23 1600   piperacillin -tazobactam (ZOSYN ) IVPB 3.375 g  Status:  Discontinued        3.375 g 100 mL/hr over 30 Minutes Intravenous Every 6 hours 12/17/23 1157 12/19/23 0955    12/17/23 1230   piperacillin -tazobactam (ZOSYN ) IVPB 3.375 g  Status:  Discontinued        3.375 g 100 mL/hr over 30 Minutes Intravenous Every 6 hours 12/17/23 1144 12/17/23 1157    12/17/23 1000   linezolid  (ZYVOX ) IVPB 600 mg  Status:  Discontinued        600 mg 300 mL/hr over 60 Minutes Intravenous Every 12 hours 12/16/23 1924 12/19/23 0958    12/17/23 0000   piperacillin -tazobactam (ZOSYN ) IVPB 3.375 g  Status:  Discontinued        3.375 g 12.5 mL/hr over 240 Minutes Intravenous Every 8 hours 12/16/23 1924 12/17/23 0931    12/16/23 1600   piperacillin -tazobactam (ZOSYN ) IVPB 3.375 g        3.375 g 12.5 mL/hr over 240 Minutes Intravenous  Once 12/16/23 1551 12/16/23 1602    12/16/23 1600   vancomycin  (VANCOREADY) IVPB 1750 mg/350 mL        1,750 mg 175 mL/hr over 120 Minutes Intravenous  Once 12/16/23 1551 12/16/23 1816           Procedures: 12-16-2023 intubation, cardioversion, Right internal jugular CVL, arterial line insertion 12-17-2023 IR drain placement into RLQ abd abscess   Consultants: PCCM Cardiology Urology General surgery ID       Assessment and Plan: * Intra-abdominal abscess (HCC) Prior to 12/28/23 Septic shock resolved.  Multiple abdominal abscesses due to MSSA.  Urinary tract infection due to enterobacter cloacae.  Initially placed on metronidazole  and cefepime, then because persistent fever has been changed to meropenem.  Follow up CT abdomen and pelvis with stable  or improvement in abdominal abscess.   Follow up cell count is 11.0  Cultures from blood have been no growth.   Plan to continue antibiotic therapy with meropenem (started on 10/01).  Will plan for now to continue antibiotic therapy until 10/08   12/28/23 pt has completed 10 days of Cefepime/Meropenem for enterobacter cloacae abscess. Asking ID if he needs more IV/PO abx. Asking urology for input about his intra-abd drains that were placed by IR.  General surgery signed off on 12-26-2023.  12/29/23 ID consulted yesterday to come up with a plan for abx therapy.  Still has abd drain. Pt states urology going to remove his foley catheter today. 12/31/2023: Antibiotics as per infectious disease. 01/01/2024: Complete course of antibiotics.  Pressure injury of buttock, unstageable (HCC) 12/28/23 Noted by wound care care on 12-26-2023. Previously noted Deep tissue pressure injury is beginning to evolve into an Unstageable pressure injury with areas of slough to the edges across bilat buttocks/sacrum, approx 6X6 cm. Was not present on admission.   Class 2 obesity with body mass index (BMI) of 35 to 39.9 without comorbidity Body mass index is 34.27 kg/m.   Acute renal failure on dialysis Prior to 12/28/23  ATN. Hyponatremia hyperkalemia.  09/30 Stopped CRRT  10/01 started intermittent hemodialysis.   Urine output is 2250 cc (foley in place)  BUN 143, cr 7,44, with K at 4.7 and serum bicarbonate at 17  Na 130   Plan for tunneled HD access.    Patient continue renal replacement therapy with hemodialysis, per nephrology recommendations   12/29/23 remains on HD. Showing some signs of renal recovery. Produced 3.4L of urine. He now has right internal jugular permcath. Renal coordinator trying to figure out where pt is going to get outpt HD if he goes to [place] and lives with his mother.  12/30/2023: No hemodialysis today.  From nephrology team is noted.  Patient is nonoliguric.  Impaired  clearance.  Continue to assess daily. 01/31/2024: Patient underwent hemodialysis with no ultrafiltration.  Patient tells me that he is not moving to [place]. 01/01/2024: Patient maintains that he is not moving to [place].  No new complaints.  Monitor patient closely, continue to assess dialysis daily.  Nephrology input is appreciated..  Prostate cancer [facility]) Prior to 12/28/23 Sp robotic assisted laparoscopic prostatectomy on 11/17/23 Continue foley catheter, plan to repeat cystogram 10/7 to 10/10 per urology.   12/28/23 urology following  Coronary artery disease 12/28/23 No chest pain, no acute coronary syndrome.   12/29/23 stable.   Essential hypertension 12/28/23 Continue blood pressure control with amlodipine  and carvedilol.   12/29/23 stable on norvasc  and coreg.   Atrial fibrillation with RVR (HCC)-resolved as of 12/28/2023  >>ASSESSMENT  AND PLAN FOR PAF (PAROXYSMAL ATRIAL FIBRILLATION) (HCC) WRITTEN ON 12/28/2023  1:46 PM BY CHEN, ERIC, DO  Prior to 12/28/23 Ventricular tachycardia sp cardioversion.   Continue with carvedilol 12.5 mg po bid.  Patient not on anticoagulation. Continue telemetry monitoring EP has been consulted with recommendations to stop amiodarone  and continue close monitoring.   12/28/23 cards/EP felt that pt's VT was due to his sepsis. Amio stopped. On coreg 12.5 mg bid. Ziopatch ordered to be placed prior to discharge.   VT (ventricular tachycardia) (HCC)-resolved as of 12/28/2023 Prior to 12/28/23 Ventricular tachycardia sp cardioversion.   Continue with carvedilol 12.5 mg po bid.  Patient not on anticoagulation. Continue telemetry monitoring EP has been consulted with recommendations to stop amiodarone  and continue close monitoring.   12/28/23 cards/EP felt that pt's VT was due to his sepsis. Amio stopped. On coreg 12.5 mg bid. Ziopatch ordered to be placed prior to discharge.   Acute metabolic encephalopathy-resolved as of  12/28/2023 Prior to 12/28/23 Mentation has been improving, continue with mild confusion but not agitation. Continue nutritional support, PT and OT.  Neuro checks per unit protocol.   12/28/23 resolved.   Bilateral pneumonia-resolved as of 12/28/2023 Prior to 12/28/23 (Present on admission)  Acute hypoxemic and hypercapnic respiratory failure.  Moderate ARDS.  Required invasive mechanical ventilation.   Completed antibiotic therapy 02 saturation today is 100% on room air.   12/28/23 resolved.   Ileus (HCC)-resolved as of 12/28/2023 Prior to 12/28/23 Patient has been weaned off TPN and now tolerating po well.  Ileus has resolved, advanced to soft today.  Surgery has signed off 12-26-2023  DVT prophylaxis: heparin  injection 5,000 Units Start: 12/17/23 1400 SCDs Start: 12/16/23 1727    Code Status: Full Code Family Communication:  Disposition Plan: Unclear  Objective: Vitals:   12/31/23 1622 12/31/23 1925 01/01/24 0427 01/01/24 0823  BP: 109/87 97/66 118/70 117/72  Pulse: (!) 102 (!) 103 97 99  Resp: 19 20 20    Temp: 98.1 F (36.7 C) 97.6 F (36.4 C) 97.9 F (36.6 C) 98.2 F (36.8 C)  TempSrc: Oral Oral Oral Oral  SpO2: 97% 100% 98% 100%  Weight:      Height:        Intake/Output Summary (Last 24 hours) at 01/01/2024 1533 Last data filed at 01/01/2024 0429 Gross per 24 hour  Intake 240 ml  Output 700 ml  Net -460 ml   Filed Weights   12/31/23 0815 12/31/23 1311 12/31/23 1332  Weight: 91.4 kg 91.4 kg 91.4 kg    Examination:  Physical Exam Vitals and nursing note reviewed.  Constitutional:      General: He is not in acute distress.    Appearance: He is not ill-appearing, toxic-appearing or diaphoretic.  HENT:     Right Ear: External ear normal.     Left Ear: External ear normal.     Nose: Nose normal.     Mouth/Throat:     Mouth: Mucous membranes are moist.     Pharynx: Oropharynx is clear.  Eyes:     Extraocular Movements: Extraocular movements  intact.     Pupils: Pupils are equal, round, and reactive to light.     Comments: Patient is pale.  Cardiovascular:     Heart sounds: Normal heart sounds.  Pulmonary:     Effort: Pulmonary effort is normal.     Breath sounds: Normal breath sounds.  Abdominal:     General: Bowel sounds are normal.  Genitourinary:    Comments: +foley  Musculoskeletal:     Cervical back: Normal range of motion and neck supple.  Skin:    Comments: Right ant chest wall permcath  Neurological:     Comments: Awake and alert.     Data Reviewed: I have personally reviewed following labs and imaging studies  CBC: Recent Labs  Lab 12/28/23 0232  WBC 11.0*  HGB 10.8*  HCT 33.3*  MCV 82.4  PLT 411*   Basic Metabolic Panel: Recent Labs  Lab 12/26/23 0316 12/27/23 0813 12/28/23 0232 12/29/23 0221 12/30/23 0721 12/31/23 0604 01/01/24 0444  NA 133* 130* 130* 129* 130* 128* 131*  K 4.5 4.7 5.1 4.2 4.5 4.1 4.3  CL 95* 95* 92* 92* 90* 91* 93*  CO2 20* 17* 18* 17* 17* 18* 27  GLUCOSE 99 122* 126* 111* 107* 119* 138*  BUN 114* 143* 125* 135* 117* 124* 63*  CREATININE 6.71* 7.44* 6.26* 6.28* 5.25* 5.12* 3.58*  CALCIUM  8.0* 7.8* 7.4* 7.7* 7.8* 8.0* 7.9*  MG 2.4  --   --   --   --   --   --   PHOS 8.9* 11.4*  --  10.2* 9.7* 10.0*  --    GFR: Estimated Creatinine Clearance: 23.1 mL/min (A) (by C-G formula based on SCr of 3.58 mg/dL (H)). Liver Function Tests: Recent Labs  Lab 12/26/23 0316 12/27/23 0813 12/29/23 0221 12/30/23 0721 12/31/23 0604  AST 39  --   --   --   --   ALT 27  --   --   --   --   ALKPHOS 92  --   --   --   --   BILITOT 1.1  --   --   --   --   PROT 6.6  --   --   --   --   ALBUMIN  1.9* 2.0* 2.0* 2.2* 2.2*   CBG: Recent Labs  Lab 12/31/23 1925 12/31/23 2329 01/01/24 0425 01/01/24 0742 01/01/24 1121  GLUCAP 150* 125* 137* 112* 120*   No results found for this or any previous visit (from the past 240 hours).  Radiology Studies: No results found.  Scheduled  Meds:  (feeding supplement) PROSource Plus  30 mL Oral TID AC   amLODipine   5 mg Oral Daily   atorvastatin   80 mg Oral Daily   bisacodyl   10 mg Rectal Daily   carvedilol  12.5 mg Oral BID WC   Chlorhexidine  Gluconate Cloth  6 each Topical Daily   feeding supplement  237 mL Oral BID BM   Gerhardt's butt cream   Topical BID   heparin  injection (subcutaneous)  5,000 Units Subcutaneous Q8H   leptospermum manuka honey  1 Application Topical Daily   metroNIDAZOLE   500 mg Oral Q12H   multivitamin  1 tablet Oral QHS   sevelamer carbonate  2,400 mg Oral TID WC   sodium chloride  flush  10-40 mL Intracatheter Q12H   sodium chloride  flush  5 mL Intracatheter Q8H   Continuous Infusions:   ceFAZolin  (ANCEF ) IV 1 g (01/01/24 0921)     LOS: 16 days   Time spent: 35 minutes  Leatrice LILLETTE Chapel, MD  [facility]  01/01/2024, 3:33 PM

## 2024-01-01 NOTE — Progress Notes (Signed)
   Progress Note  Patient Name: Glenn Stewart Date of Encounter: 01/01/2024  Primary Cardiologist: Redell Shallow, MD  Interval Summary  Following in background.  Telemetry reviewed, maintaining sinus rhythm.  No longer on amiodarone .  Otherwise continues on Coreg 12.5 mg twice daily, Norvasc  5 mg daily, and Lipitor 80 mg daily.  Prior VT on September 26 felt to be reversible in the setting of acute illness per evaluation by EP.  LVEF 50 to 55% and RV contraction normal.  No further atrial fibrillation at this time.  Plan is 14-day live ZIO monitor at patient discharge.  For questions or updates, please contact [facility] Please consult [email or web address] for contact info under   Signed, Jayson Sierras, MD  01/01/2024, 10:05 AM

## 2024-01-01 NOTE — Plan of Care (Signed)
  Problem: Bowel/Gastric: Goal: Gastrointestinal status for postoperative course will improve Outcome: Progressing   Problem: Urinary Elimination: Goal: Ability to avoid or minimize complications of infection will improve Outcome: Progressing Goal: Home care management will improve Outcome: Progressing

## 2024-01-01 NOTE — Progress Notes (Signed)
 Mobility Specialist Progress Note:    01/01/24 1030  Mobility  Activity Ambulated with assistance  Level of Assistance Standby assist, set-up cues, supervision of patient - no hands on  Assistive Device Front wheel walker  Distance Ambulated (ft) 120 ft  Activity Response Tolerated well  Mobility Referral Yes  Mobility visit 1 Mobility  Mobility Specialist Start Time (ACUTE ONLY) 1030  Mobility Specialist Stop Time (ACUTE ONLY) 1045  Mobility Specialist Time Calculation (min) (ACUTE ONLY) 15 min   Pt received in bed agreeable to mobility. No c/o throughout. No physical assistance needed. Returned to room w/o fault. Left in bed w/ call bell and personal belongings in reach. All needs met.   Thersia Minder Mobility Specialist  Please contact vis Secure Chat or  Rehab Office [phone]

## 2024-01-02 ENCOUNTER — Other Ambulatory Visit (HOSPITAL_COMMUNITY): Payer: Self-pay

## 2024-01-02 DIAGNOSIS — K651 Peritoneal abscess: Secondary | ICD-10-CM | POA: Diagnosis not present

## 2024-01-02 LAB — RENAL FUNCTION PANEL
Albumin: 2.2 g/dL — ABNORMAL LOW (ref 3.5–5.0)
Anion gap: 14 (ref 5–15)
BUN: 70 mg/dL — ABNORMAL HIGH (ref 6–20)
CO2: 25 mmol/L (ref 22–32)
Calcium: 8.1 mg/dL — ABNORMAL LOW (ref 8.9–10.3)
Chloride: 94 mmol/L — ABNORMAL LOW (ref 98–111)
Creatinine, Ser: 3.96 mg/dL — ABNORMAL HIGH (ref 0.61–1.24)
GFR, Estimated: 17 mL/min — ABNORMAL LOW (ref >60)
Glucose, Bld: 108 mg/dL — ABNORMAL HIGH (ref 70–99)
Phosphorus: 5.4 mg/dL — ABNORMAL HIGH (ref 2.5–4.6)
Potassium: 4.3 mmol/L (ref 3.5–5.1)
Sodium: 133 mmol/L — ABNORMAL LOW (ref 135–145)

## 2024-01-02 LAB — GLUCOSE, CAPILLARY
Glucose-Capillary: 110 mg/dL — ABNORMAL HIGH (ref 70–99)
Glucose-Capillary: 211 mg/dL — ABNORMAL HIGH (ref 70–99)
Glucose-Capillary: 133 mg/dL — ABNORMAL HIGH (ref 70–99)

## 2024-01-02 MED ORDER — CARVEDILOL 12.5 MG PO TABS
12.5000 mg | ORAL_TABLET | Freq: Two times a day (BID) | ORAL | 0 refills | Status: AC
  Filled 2024-01-02: qty 60, 30d supply, fill #0

## 2024-01-02 MED ORDER — PROSOURCE PLUS PO LIQD
30.0000 mL | Freq: Three times a day (TID) | ORAL | 0 refills | Status: AC
  Filled 2024-01-02: qty 2700, 30d supply, fill #0

## 2024-01-02 MED ORDER — RENA-VITE PO TABS
1.0000 | ORAL_TABLET | Freq: Every day | ORAL | 1 refills | Status: AC
  Filled 2024-01-02 – 2024-02-29 (×2): qty 30, 30d supply, fill #0

## 2024-01-02 MED ORDER — AMOXICILLIN-POT CLAVULANATE 500-125 MG PO TABS
1.0000 | ORAL_TABLET | Freq: Two times a day (BID) | ORAL | 0 refills | Status: AC
  Filled 2024-01-02: qty 14, 7d supply, fill #0

## 2024-01-02 MED ORDER — COLLAGENASE 250 UNIT/GM EX OINT
TOPICAL_OINTMENT | Freq: Every day | CUTANEOUS | Status: DC
  Filled 2024-01-02: qty 30

## 2024-01-02 MED ORDER — SEVELAMER CARBONATE 800 MG PO TABS
1600.0000 mg | ORAL_TABLET | Freq: Three times a day (TID) | ORAL | 0 refills | Status: AC
  Filled 2024-01-02: qty 120, 20d supply, fill #0

## 2024-01-02 MED ORDER — MEDIHONEY WOUND/BURN DRESSING EX PSTE
1.0000 | PASTE | Freq: Every day | CUTANEOUS | 1 refills | Status: AC
  Filled 2024-01-02: qty 44, 44d supply, fill #0

## 2024-01-02 MED ORDER — MEDIHONEY WOUND/BURN DRESSING EX PSTE
1.0000 | PASTE | Freq: Every day | CUTANEOUS | Status: DC
Start: 2024-01-02 — End: 2024-01-02
  Filled 2024-01-02: qty 44

## 2024-01-02 MED ORDER — ENSURE PLUS HIGH PROTEIN PO LIQD
237.0000 mL | Freq: Two times a day (BID) | ORAL | 0 refills | Status: AC
  Filled 2024-01-02: qty 14220, 30d supply, fill #0

## 2024-01-02 MED ORDER — METRONIDAZOLE 500 MG PO TABS
500.0000 mg | ORAL_TABLET | Freq: Two times a day (BID) | ORAL | 0 refills | Status: AC
  Filled 2024-01-02 – 2024-02-29 (×2): qty 14, 7d supply, fill #0

## 2024-01-02 MED ORDER — GERHARDT'S BUTT CREAM
1.0000 | TOPICAL_CREAM | Freq: Two times a day (BID) | CUTANEOUS | 1 refills | Status: AC
  Filled 2024-01-02: qty 60, 30d supply, fill #0
  Filled 2024-01-12: qty 60, 10d supply, fill #1

## 2024-01-02 NOTE — Progress Notes (Signed)
 Patient ID: Glenn Stewart, male   DOB: 24-Mar-1965, 58 y.o.   MRN: [id number] S: Feels well.  Thinks he is being discharged today and will go to HD in [place] on Wednesday O:BP 122/84 (BP Location: Left Arm)   Pulse (!) 102   Temp 98.3 F (36.8 C)   Resp 19   Ht 5' 5 (1.651 m)   Wt 91.5 kg   SpO2 98%   BMI 33.57 kg/m   Intake/Output Summary (Last 24 hours) at 01/02/2024 1246 Last data filed at 01/02/2024 0905 Gross per 24 hour  Intake 1255.03 ml  Output 1100 ml  Net 155.03 ml   Intake/Output: I/O last 3 completed shifts: In: 855 [P.O.:480; Other:75; IV Piggyback:300] Out: 1300 [Urine:1300]  Intake/Output this shift:  Total I/O In: 400 [P.O.:400] Out: 500 [Urine:500] Weight change:  Gen: NAD CVS: Tachy at 102, no rub Resp: CTA Abd: +BS, soft, NT/ND Ext: no edema  Recent Labs  Lab 12/27/23 0813 12/28/23 0232 12/29/23 0221 12/30/23 0721 12/31/23 0604 01/01/24 0444 01/02/24 0450  NA 130* 130* 129* 130* 128* 131* 133*  K 4.7 5.1 4.2 4.5 4.1 4.3 4.3  CL 95* 92* 92* 90* 91* 93* 94*  CO2 17* 18* 17* 17* 18* 27 25  GLUCOSE 122* 126* 111* 107* 119* 138* 108*  BUN 143* 125* 135* 117* 124* 63* 70*  CREATININE 7.44* 6.26* 6.28* 5.25* 5.12* 3.58* 3.96*  ALBUMIN  2.0*  --  2.0* 2.2* 2.2*  --  2.2*  CALCIUM  7.8* 7.4* 7.7* 7.8* 8.0* 7.9* 8.1*  PHOS 11.4*  --  10.2* 9.7* 10.0*  --  5.4*   Liver Function Tests: Recent Labs  Lab 12/30/23 0721 12/31/23 0604 01/02/24 0450  ALBUMIN  2.2* 2.2* 2.2*   No results for input(s): LIPASE, AMYLASE in the last 168 hours. No results for input(s): AMMONIA in the last 168 hours. CBC: Recent Labs  Lab 12/28/23 0232  WBC 11.0*  HGB 10.8*  HCT 33.3*  MCV 82.4  PLT 411*   Cardiac Enzymes: No results for input(s): CKTOTAL, CKMB, CKMBINDEX, TROPONINI in the last 168 hours. CBG: Recent Labs  Lab 01/01/24 2013 01/01/24 2317 01/02/24 0406 01/02/24 0826 01/02/24 1118  GLUCAP 113* 109* 110* 211* 133*    Iron  Studies: No results for input(s): IRON, TIBC, TRANSFERRIN, FERRITIN in the last 72 hours. Studies/Results: No results found.  (feeding supplement) PROSource Plus  30 mL Oral TID AC   amLODipine   5 mg Oral Daily   atorvastatin   80 mg Oral Daily   bisacodyl   10 mg Rectal Daily   carvedilol  12.5 mg Oral BID WC   Chlorhexidine  Gluconate Cloth  6 each Topical Daily   feeding supplement  237 mL Oral BID BM   Gerhardt's butt cream   Topical BID   heparin  injection (subcutaneous)  5,000 Units Subcutaneous Q8H   leptospermum manuka honey  1 Application Topical Daily   metroNIDAZOLE   500 mg Oral Q12H   multivitamin  1 tablet Oral QHS   sevelamer carbonate  2,400 mg Oral TID WC   sodium chloride  flush  10-40 mL Intracatheter Q12H   sodium chloride  flush  5 mL Intracatheter Q8H    BMET    Component Value Date/Time   NA 133 (L) 01/02/2024 0450   NA 141 06/08/2022 1108   K 4.3 01/02/2024 0450   CL 94 (L) 01/02/2024 0450   CO2 25 01/02/2024 0450   GLUCOSE 108 (H) 01/02/2024 0450   BUN 70 (H) 01/02/2024 0450  BUN 16 06/08/2022 1108   CREATININE 3.96 (H) 01/02/2024 0450   CALCIUM  8.1 (L) 01/02/2024 0450   GFRNONAA 17 (L) 01/02/2024 0450   GFRAA >90 01/07/2014 0245   CBC    Component Value Date/Time   WBC 11.0 (H) 12/28/2023 0232   RBC 4.04 (L) 12/28/2023 0232   HGB 10.8 (L) 12/28/2023 0232   HGB 14.1 06/08/2022 1108   HCT 33.3 (L) 12/28/2023 0232   HCT 43.0 06/08/2022 1108   PLT 411 (H) 12/28/2023 0232   PLT 250 06/08/2022 1108   MCV 82.4 12/28/2023 0232   MCV 84 06/08/2022 1108   MCH 26.7 12/28/2023 0232   MCHC 32.4 12/28/2023 0232   RDW 15.1 12/28/2023 0232   RDW 13.2 06/08/2022 1108   LYMPHSABS 0.8 12/22/2023 0359   MONOABS 0.8 12/22/2023 0359   EOSABS 0.1 12/22/2023 0359   BASOSABS 0.1 12/22/2023 0359    A AKI, initially anuric, baseline normal GFR.  No evidence of obstruction on imaging at presentation.  Suspect ATN.  Repeat bladder pressure not consistent  with abdominal compartment syndrome on 9/27.  CT A/P w contrast, 10/1: Renal unremarkable. Now nonoliguric.  Hyponatremia, mild Abdominal fluid collections concerning for urinoma after radical prostatectomy 11/14/2023; status post drain placement 9/27 by IR. Urology f/u outpt.  Ileus versus SBO, surgery following, ileus favored and now resolved VDRF resolved A-fib with RVR and V. tach at presentation, cardiology following on amiodarone  Septic shock, resolved Hypertension: On amlodipine  5 daily, coreg 12.5 BID.  BP somewhat labile but generally ok Anemia: mild Hb 10s     P Stopped CRRT 9/30.  1st HD, 10/1, last 10/11, next tentatively 10/14 Cont daily labs and UOP assessment - based on great UOP looks like he's recovering but clearance lagging Outpatient AKI HD CLIP underway while we concurrently monitor for renal recovery - he has a MWF HD chair in [facility] but pt doesn't seem to have a clear picture of where he's going - home vs [facility] - can be further discussed w SW tomorrow.  We can switch his HD to MWF  closer to d/c but for now TTS. Increased binder for phos 10, on renal diet Daily weights, Daily Renal Panel, Strict I/Os, Avoid nephrotoxins (NSAIDs, judicious IV Contrast)  Medication Issues; Preferred narcotic agents for pain control are hydromorphone , fentanyl , and methadone. Morphine  should not be used.  Baclofen should be avoided Avoid oral sodium phosphate  and magnesium  citrate based laxatives / bowel preps   Fairy RONAL Sellar, MD [facility]

## 2024-01-02 NOTE — Discharge Summary (Signed)
 Physician Discharge Summary  Patient ID: Glenn Stewart MRN: [id number] DOB/AGE: Jul 08, 1965 58 y.o.  Admit date: 12/16/2023 Discharge date: 01/02/2024  Admission Diagnoses:  Discharge Diagnoses:  Principal Problem:   Intra-abdominal abscess [facility]) Active Problems:   Essential hypertension   Coronary artery disease   Prostate cancer (HCC)   Acute renal failure on dialysis   Class 2 obesity with body mass index (BMI) of 35 to 39.9 without comorbidity   Pressure injury of buttock, unstageable (HCC)   Discharged Condition: stable  Hospital Course: Patient is a 58 year old male with past medical history significant for hypertension, coronary artery disease, CABG, atrial fibrillation, GERD, NASH, bipolar disorder and ADHD.  Recently underwent laparoscopic radical prostatectomy on 11/17/2023.  Patient was admitted with intra-abdominal abscess, septic shock, acute kidney injury requiring CRRT and conventional hemodialysis.  Imaging studies done on presentation revealed intra-abdominal abscesses.  Interventional radiology team was consulted to place drains.  Patient was initially admitted to the ICU team.  Patient was on CRRT.  Urology, infectious disease, cardiology and general surgery teams were consulted to assist with patient's management.  Acute kidney injury seems to be resolving, but clearance is lagging behind.  Patient will continue hemodialysis on discharge.  The plan is for hemodialysis Monday, Wednesday and Friday, whilst renal recovery continues to be assessed.  Patient will be discharged back home on Augmentin, to complete course of antibiotics.   Intra-abdominal abscess/sepsis with septic shock:  -Septic shock resolved. - Multiple abdominal abscesses due to MSSA.  -Urinary tract infection due to enterobacter cloacae.  -Initially placed on metronidazole  and cefepime, then because persistent fever has been changed to meropenem.  -Follow up CT abdomen and pelvis with stable or  improvement in abdominal abscess.  -Infectious disease advised discharge home on oral Augmentin. -Patient completed 10 days of Cefepime/Meropenem for enterobacter cloacae abscess.    Pressure injury of buttock, unstageable (HCC) -Noted by wound care care on 12-26-2023. Previously noted Deep tissue pressure injury is beginning to evolve into an Unstageable pressure injury with areas of slough to the edges across bilat buttocks/sacrum, approx 6X6 cm. Was not present on admission.    Class 2 obesity with body mass index (BMI) of 35 to 39.9 without comorbidity   Acute renal failure on dialysis -Likely dense ATN that required CRRT.  CRRT was stopped on 12/20/2023.  Intermittent hemodialysis was started on 12/21/2023. - Some renal recovery is noted, however, clearance is lagging behind. - Continue intermittent hemodialysis on discharge.    Prostate cancer [facility]): - S/P robotic assisted laparoscopic prostatectomy on 11/17/23 -Further management as per the urology team.   Coronary artery disease: -Stable. - No chest pain.    Essential hypertension: -Continue to monitor and optimize.   Atrial fibrillation with RVR: Ventricular tachycardia: - S/p cardioversion  - Ziopatch ordered to be placed prior to discharge. -Patient not on anticoagulation. - Follow-up with cardiology/EP on discharge.    Acute metabolic encephalopathy: - Resolved.   - Likely multifactorial.     Bilateral pneumonia: --Resolved.  Acute hypoxemic and hypercapnic respiratory failure: -Moderate ARDS considered at some point. -Required invasive mechanical ventilation.  -Resolved.  Ileus: - Resolved. - Patient has been weaned off TPN and now tolerating orally.     Consults: cardiology, pulmonary/intensive care, ID, general surgery, urology, and interventional radiology  Significant Diagnostic Studies:  CT angiography chest and CT abdomen and pelvis with contrast revealed: 1. Fluid-filled distended small bowel  with decompressed terminal ileum consistent with small-bowel obstruction. 2. Irregular wall thickening diffusely  throughout the distal small bowel and right colon suggesting enterocolitis or inflammatory bowel disease. Inflammatory strictures may be present. 3. Multiple loculated fluid collections, some with gas, demonstrated in the right abdomen and pelvis indicating abscesses. This is likely due to perforation, possibly related to inflammatory bowel disease or to an occult perforated appendicitis. The appendix is self is not demonstrated. No free intra-abdominal air. 4. Small amount of free fluid in the abdomen is likely reactive. 5. Cholelithiasis without evidence of acute cholecystitis. 6. Aortic atherosclerosis. 7. Consolidation versus compressive atelectasis in both lung bases. Small bilateral pleural effusions.  CT head without contrast revealed: - No acute abnormalities.  Repeat CT abdomen and pelvis with contrast done on 12/21/2023 revealed: 1. Interval decompression of the right retroperitoneal fluid collection, measuring 2.8 x 7.7 cm (previously, 5.6 x 10.6 cm, by my measurement). The pigtail percutaneous drainage catheter is otherwise similarly well-positioned in the fluid collection. 2. irregular fluid collection in the right lower quadrant extending to the midline ventral abdominal wall, measures 6.5 x 14.4 x 13.3 cm (axial 80), unchanged. 3. The bilateral pelvic sidewall fluid collections have decreased in size in the interim, measuring 5.6 x 2.5 cm on the right (previously, 6.5 x 4.7 cm). 4. Multiple small additional fluid collections in the right ileocolic mesentery, along the right paracolic gutter, and the right lower quadrant ventral abdominal wall are unchanged, as described above. 5. Similarly positioned low-lying catheter in the urinary bladder, which may be expected positioning status post prostatectomy. 6. Well-positioned esophagogastric tube terminating in  the stomach. While a couple of segments of small bowel in the upper abdomen are distended, no abrupt transition point present to suggest small bowel obstruction.   Aortic Atherosclerosis (ICD10-I70.0).  Abdominal x-ray done on 12/22/2023 revealed: Unchanged gaseous small bowel in transverse colonic distention in the central abdomen, favoring ileus.   Discharge Exam: Blood pressure 122/84, pulse (!) 102, temperature 98.3 F (36.8 C), resp. rate 19, height 5' 5 (1.651 m), weight 91.5 kg, SpO2 98%.   Disposition: Discharge disposition: 01-Home or Self Care       Discharge Instructions     Diet - low sodium heart healthy   Complete by: As directed    Diet renal with fluid restriction   Complete by: As directed    Discharge wound care:   Complete by: As directed    Continue current wound care plan   Increase activity slowly   Complete by: As directed       Allergies as of 01/02/2024       Reactions   Sulfonamide Derivatives Hives        Medication List     STOP taking these medications    acetaminophen  325 MG tablet Commonly known as: TYLENOL    isosorbide  mononitrate 30 MG 24 hr tablet Commonly known as: IMDUR    metoprolol  tartrate 25 MG tablet Commonly known as: LOPRESSOR    pantoprazole  20 MG tablet Commonly known as: PROTONIX    sertraline  100 MG tablet Commonly known as: ZOLOFT    tamsulosin  0.4 MG Caps capsule Commonly known as: FLOMAX    traMADol  50 MG tablet Commonly known as: Ultram        TAKE these medications    (feeding supplement) PROSource Plus liquid Take 30 mLs by mouth 3 (three) times daily before meals.   feeding supplement Liqd Take 237 mLs by mouth 2 (two) times daily between meals.   amLODipine  5 MG tablet Commonly known as: NORVASC  Take 1 tablet (5 mg total) by mouth daily.  amoxicillin-clavulanate 875-125 MG tablet Commonly known as: AUGMENTIN Take 1 tablet by mouth 2 (two) times daily for 7 days.   atorvastatin   80 MG tablet Commonly known as: LIPITOR Take 1 tablet (80 mg total) by mouth daily.   carvedilol 12.5 MG tablet Commonly known as: COREG Take 1 tablet (12.5 mg total) by mouth 2 (two) times daily with a meal.   docusate sodium  100 MG capsule Commonly known as: COLACE Take 1 capsule (100 mg total) by mouth 2 (two) times daily.   Gerhardt's butt cream Crea Apply 1 Application topically 2 (two) times daily.   leptospermum manuka honey Pste paste Apply 1 Application topically daily.   metroNIDAZOLE  500 MG tablet Commonly known as: FLAGYL  Take 1 tablet (500 mg total) by mouth every 12 (twelve) hours for 7 days.   multivitamin Tabs tablet Take 1 tablet by mouth at bedtime.   sevelamer carbonate 800 MG tablet Commonly known as: RENVELA Take 2 tablets (1,600 mg total) by mouth 3 (three) times daily with meals for 20 days.               Discharge Care Instructions  (From admission, onward)           Start     Ordered   01/02/24 0000  Discharge wound care:       Comments: Continue current wound care plan   01/02/24 1501            Follow-up Information     [facility], [place]. Go on 01/04/2024.   Why: Please arrive to this appointment on 10/15 at 11:30 am for 12:00 pm start time. Paperwork will need to be completed prior to treatment. Please take photo ID and insurance card to first appointment. Contact information: [facility], [address], [place], [place], [address] [phone]               Time spent: 45 minutes.  SignedBETHA Leatrice LILLETTE Rosario 01/02/2024, 3:01 PM

## 2024-01-02 NOTE — Consult Note (Signed)
 WOC Nurse wound follow up Wound type:unstageable pressure injury to sacrococcygeal area.  Nonintact area to periwound to upper buttocks consistent with medical adhesive related skin injury.  Fecal management system has been removed. Due to recent prostatectomy and prolonged catheterization, is incontinent of urine still.  Is completing pelvic floor muscle exercises.  Measurement: 6 cm x 6 cm with devitalized tissue to center over coccyx.  Right buttocks with 2 cm x 1.5 cm x 0.1 cm nonintact area.   Wound bed:80% slough/eschar to sacral wound. RIght buttock wound with thin adherent fibrin.   Drainage (amount, consistency, odor) minimal serosanguinous  musty odor Periwound: Dry, flaking skin, epidermal stripping from tape.   Dressing procedure/placement/frequency: Cleanse buttock wounds with VASHE (LAWSON # [id number])   Santyl to wound bed.  Cover with NS moist gauze.  Secure with sacral foam (silicone- no tape to skin)   Change daily.  Will follow weekly  Darice Cooley MSN, RN, FNP-BC CWON Wound, Ostomy, Continence Nurse Outpatient [facility] [phone] Work cell phone:  [phone]

## 2024-01-02 NOTE — Progress Notes (Signed)
 Physical Therapy Treatment Patient Details Name: Glenn Stewart MRN: [id number] DOB: [birth year] Today's Date: 01/02/2024   History of Present Illness Pt is 58 year old presented [facility]  [facility]16[facility]23 for weakness and tender abdomen. Pt with afib with rvr and vtach and underwent emergent cardioversion. Post cardioversion hypotension and resp distress and intubated. Pt with shock and AKI and intraabdominal fluid collections concerning for urinoma. Abdominal drain placement on 9/27. Pt started on CRRT and transitioned to HD on 10/1. AbPMH - 8/28 prostatectomy, htn, CABG, afib, NASH, bipolar, ADHD    PT Comments  Continuing work on functional mobility and activity tolerance;  Session focused on progressive amb, with good progress and incr distance;  More steady with RW, but abel to walk very short distances without RW; Offered stair training, pt declined; Rec HHPT follow up, and RW for ambulation   If plan is discharge home, recommend the following: A little help with walking and/or transfers;A little help with bathing/dressing/bathroom;Assistance with cooking/housework;Assist for transportation;Help with stairs or ramp for entrance   Can travel by private vehicle        Equipment Recommendations  Rolling walker (2 wheels)    Recommendations for Other Services       Precautions / Restrictions Precautions Precautions: Fall;Other (comment) Precaution/Restrictions Comments: Abdominal drain Restrictions Weight Bearing Restrictions Per Provider Order: No     Mobility  Bed Mobility Overal bed mobility: Needs Assistance Bed Mobility: Supine to Sit, Sit to Supine     Supine to sit: Supervision Sit to supine: Supervision   General bed mobility comments: Moving well and managing lines and bed features    Transfers Overall transfer level: Needs assistance Equipment used: Rolling walker (2 wheels), 1 person hand held assist Transfers: Sit to/from Stand Sit to Stand: Contact guard  assist   Step pivot transfers: Contact guard assist       General transfer comment: Cues for hand placement; Good rise; took pivot steps recliner to bed    Ambulation/Gait Ambulation/Gait assistance: Contact guard assist, Supervision Gait Distance (Feet): 200 Feet Assistive device: Rolling walker (2 wheels) Gait Pattern/deviations: Step-through pattern, Decreased stride length       General Gait Details: Reporting tight calves during walk, performed standing calf stretching; L calf with burning pain which increased with quicker steps; eased off with slower amb   Stairs             Wheelchair Mobility     Tilt Bed    Modified Rankin (Stroke Patients Only)       Balance     Sitting balance-Leahy Scale: Good       Standing balance-Leahy Scale: Good (approaching Fair)                              Communication Communication Communication: Impaired Factors Affecting Communication: Hearing impaired  Cognition Arousal: Alert Behavior During Therapy: WFL for tasks assessed/performed     Difficult to assess due to: Hard of hearing/deaf                       Following commands: Intact      Cueing Cueing Techniques: Verbal cues  Exercises      General Comments General comments (skin integrity, edema, etc.): NAD on room air; provided pt with pressure redistribution cushion      Pertinent Vitals/Pain Pain Assessment Pain Assessment: Faces Faces Pain Scale: Hurts even more Pain Location: L calf,  with faster walking Pain Descriptors / Indicators: Burning, Stabbing Pain Intervention(s): Monitored during session, Other (comment) (Pt adjusted his distance)    Home Living                          Prior Function            PT Goals (current goals can now be found in the care plan section) Acute Rehab PT Goals Patient Stated Goal: go to sister's home to recover PT Goal Formulation: With patient Time For Goal  Achievement: 01/11/24 Potential to Achieve Goals: Good Progress towards PT goals: Progressing toward goals    Frequency    Min 2X/week      PT Plan      Co-evaluation              AM-PAC PT 6 Clicks Mobility   Outcome Measure  Help needed turning from your back to your side while in a flat bed without using bedrails?: None Help needed moving from lying on your back to sitting on the side of a flat bed without using bedrails?: A Little Help needed moving to and from a bed to a chair (including a wheelchair)?: A Little Help needed standing up from a chair using your arms (e.g., wheelchair or bedside chair)?: A Little Help needed to walk in hospital room?: A Little Help needed climbing 3-5 steps with a railing? : A Little 6 Click Score: 19    End of Session Equipment Utilized During Treatment: Gait belt Activity Tolerance: Patient tolerated treatment well Patient left: in bed;with call bell/phone within reach Nurse Communication: Mobility status PT Visit Diagnosis: Unsteadiness on feet (R26.81);Other abnormalities of gait and mobility (R26.89);Muscle weakness (generalized) (M62.81)     Time: 9063-8982 PT Time Calculation (min) (ACUTE ONLY): 41 min  Charges:    $Gait Training: 23-37 mins $Therapeutic Activity: 8-22 mins PT General Charges $$ ACUTE PT VISIT: 1 Visit                     Silvano Currier, PT  Acute Rehabilitation Services Office [phone] Secure Chat welcomed    Silvano VEAR Currier 01/02/2024, 11:41 AM

## 2024-01-02 NOTE — TOC Transition Note (Signed)
 Transition of Care [facility]) - Discharge Note   Patient Details  Name: Glenn Stewart MRN: [id number] Date of Birth: 09/30/1965  Transition of Care [facility]) CM/SW Contact:  Tom-Johnson, Harvest Muskrat, RN Phone Number: 01/02/2024, 3:12 PM   Clinical Narrative:      Patient is scheduled for discharge today.  CM spoke with patient at bedside and sister Tena via phone. Patient states he will be going to stay with his mother tomorrow and friend Chyrl will transport to [place] and to his first HD appointment. Lucian states she is arranging transportation with [facility] to transport patient to and from his outpatient HD. Readmission Risk Assessment done. Home health recommended, referral sent through the HUB and all responses declined. Patient states he does not need therapy, states he is doing much better for himself and has assistance at home. States he and his mother will care for his sacral wound. Hospital f/u and discharge instructions on AVS. RW ordered from [employer] and haley will deliver to patient at the discharge lounge.  Prescriptions sent to [facility] pharmacy and patient will receive meds prior discharge. Friend, Rex at bedside and will transport at discharge.  No further ICM needs noted.         Final next level of care: Home/Self Care Barriers to Discharge: Barriers Resolved   Patient Goals and CMS Choice Patient states their goals for this hospitalization and ongoing recovery are:: To return home CMS Medicare.gov Compare Post Acute Care list provided to:: Patient Choice offered to / list presented to : Patient, Sibling (Sister, Tena)      Discharge Placement                Patient to be transferred to facility by: Friend Name of family member notified: Rex    Discharge Plan and Services Additional resources added to the After Visit Summary for                  DME Arranged: Walker rolling DME Agency: [employer] Date DME Agency Contacted: 01/02/24 Time DME  Agency Contacted: 1500 Representative spoke with at DME Agency: Arthea            Social Drivers of Health (SDOH) Interventions SDOH Screenings   Food Insecurity: No Food Insecurity (11/17/2023)  Housing: Low Risk  (11/17/2023)  Transportation Needs: No Transportation Needs (11/17/2023)  Utilities: Not At Risk (11/17/2023)  Tobacco Use: Low Risk  (12/16/2023)     Readmission Risk Interventions    01/02/2024    3:08 PM 05/19/2022   12:57 PM  Readmission Risk Prevention Plan  Transportation Screening Complete Complete  PCP or Specialist Appt within 5-7 Days  Complete  PCP or Specialist Appt within 3-5 Days Complete   Home Care Screening  Complete  Medication Review (RN CM)  Complete  HRI or Home Care Consult Complete   Social Work Consult for Recovery Care Planning/Counseling Complete   Palliative Care Screening Not Applicable   Medication Review Oceanographer) Referral to Pharmacy

## 2024-01-02 NOTE — Progress Notes (Addendum)
 Contacted [facility] this morning to advise clinic staff that pt is still currently hospitalized and will not be starting at clinic today as planned. Navigator will follow and provide update to clinic on pt's d/c date once date is confirmed by hospital staff. Will assist as needed.   Randine Mungo Dialysis Navigator [phone]  Addendum at 12:19 pm: Attending advising team that pt will remain local at d/c and pt needs a clinic in the local area. Contacted pt's sister, Lucian, via phone to confirm this info. Unable to reach sister and left a message requesting a return call. Sister stated last week that pt cannot return to his home due to pt's home not being a clean environment for pt to recover in. Will await return call from sister to discuss needs/plans further.   Addendum at 12:35 pm: Met with pt at bedside. Pt's friend, Rex, was present as well. Pt confirms that he will go to mother's home in [place] at d/c. Pt does state he needs to take care of some personal affairs prior to leaving for [place]. Pt voices understanding that he needs to start at out-pt HD clinic on Wed and to arrive at 11:30 am for paperwork prior to treatment. Pt has out-pt HD info from Friday and also updated info on AVS. Navigator spoke to pt's sister, Lucian, via phone as well. Sister confirms that pt will d/c to [facility] and stay with pt's mother at d/c. Sister aware and agreeable to out-pt HD arrangements and that info was provided to sister today as well. Nephrologist agreeable to d/c today if pt deemed stable and to start at clinic on Wednesday in [place].   Addendum at 3:35 pm: D/C order noted. Contacted [facility] to be advised of pt's d/c today and that pt should start on Wednesday. Navigator will fax d/c summary and today's renal note to clinic for continuation of care once d/c summary completed.

## 2024-01-03 LAB — TRIGLYCERIDES, BODY FLUIDS: Triglycerides, Fluid: 48 mg/dL

## 2024-01-03 NOTE — Progress Notes (Signed)
 Late Note Entry- January 03, 2024  D/C summary and last renal note faxed to [facility] this morning for continuation of care.   Randine Mungo Dialysis Navigator [phone]

## 2024-01-12 ENCOUNTER — Other Ambulatory Visit (HOSPITAL_COMMUNITY): Payer: Self-pay

## 2024-01-16 ENCOUNTER — Other Ambulatory Visit (HOSPITAL_COMMUNITY): Payer: Self-pay

## 2024-02-10 NOTE — Progress Notes (Signed)
 Patient ID: Glenn Stewart, male   DOB: 04-Oct-1965, 58 y.o.   MRN: [id number]  IR made aware of patient call regarding removal of his RLQ drain. Originally placed by IR on 9/27, then injected in hospital on 10/3 with small remaining abscess but no fistula - with drain left in, and ultimately discharged on 10/13. It appears IR was not notified upon patient discharge and no follow up outpatient order was placed. Pt reports no output from the drain 3-4 weeks, no pain, redness or other concerns.  IR schedulers notified and will reach out to patient first thing Monday morning (11/24) to get patient in first thing next week for drain evaluation and possible removal.   Patient voices understanding and agreement.   Kimble DEL Ardys Hataway PA-C 02/10/2024 3:53 PM

## 2024-02-14 ENCOUNTER — Other Ambulatory Visit: Payer: Self-pay | Admitting: General Surgery

## 2024-02-14 DIAGNOSIS — K651 Peritoneal abscess: Secondary | ICD-10-CM

## 2024-02-15 ENCOUNTER — Ambulatory Visit (INDEPENDENT_AMBULATORY_CARE_PROVIDER_SITE_OTHER): Admitting: Family

## 2024-02-15 ENCOUNTER — Encounter: Payer: Self-pay | Admitting: Family

## 2024-02-15 ENCOUNTER — Telehealth: Payer: Self-pay

## 2024-02-15 ENCOUNTER — Other Ambulatory Visit: Payer: Self-pay

## 2024-02-15 VITALS — BP 128/86 | HR 100 | Temp 98.2°F | Wt 216.0 lb

## 2024-02-15 DIAGNOSIS — K651 Peritoneal abscess: Secondary | ICD-10-CM

## 2024-02-15 NOTE — Progress Notes (Signed)
 Subjective:   Patient ID: Glenn Stewart, male    DOB: Aug 18, 1965, 58 y.o.   MRN: [id number]  Chief Complaint  Patient presents with   Follow-up    Intra-abdominal abscess    HPI:  Glenn Stewart is a 58 y.o. male with recent history of robotic assisted laparoscopic radical prostatectomy presenting to the hospital with altered mental status and found to have atrial fibrillation with RVR and acute respiratory failure requiring intubation with course complicated by findings of intra-abdominal abscess status post percutaneous drain placement with cultures growing MSSA.  Hospitalization plan was for 10 days total treatment with amoxicillin /clavulanate with end date of 01/08/2024 and continue drain management per interventional radiology.  He has a CT abdomen/pelvis pending for 02/20/2024.  Glenn Stewart has been doing well since leaving the hospital and completed his amoxicillin /clavulanate on 01/08/2024 as prescribed with no adverse side effects.  Has been off antibiotics for at least the last 4 weeks.  Percutaneous drain remains in place with minimal drainage and confirms appointment for 02/20/2024 for next imaging and possible drain removal.  No fevers, chills or sweats.  Does have generalized abdominal tenderness.   Allergies  Allergen Reactions   Sulfonamide Derivatives Hives      Outpatient Medications Prior to Visit  Medication Sig Dispense Refill   amLODipine  (NORVASC ) 5 MG tablet Take 1 tablet (5 mg total) by mouth daily. 90 tablet 3   atorvastatin  (LIPITOR ) 80 MG tablet Take 1 tablet (80 mg total) by mouth daily. 90 tablet 3   carvedilol  (COREG ) 12.5 MG tablet Take 1 tablet (12.5 mg total) by mouth 2 (two) times daily with a meal. 60 tablet 0   docusate sodium  (COLACE) 100 MG capsule Take 1 capsule (100 mg total) by mouth 2 (two) times daily.     feeding supplement (ENSURE PLUS HIGH PROTEIN) LIQD Take 237 mLs by mouth 2 (two) times daily between meals. 14220 mL 0   leptospermum  manuka honey (MEDIHONEY) PSTE paste Apply 1 Application topically daily. 44 mL 1   multivitamin (RENA-VIT) TABS tablet Take 1 tablet by mouth at bedtime. 30 tablet 1   Nystatin (GERHARDT'S BUTT CREAM) CREA Apply 1 Application topically 2 (two) times daily. 60 each 1   No facility-administered medications prior to visit.     Past Medical History:  Diagnosis Date   ADHD (attention deficit hyperactivity disorder)    Anxiety state, unspecified    Atrial fibrillation with RVR (HCC) 12/19/2023   Bipolar 1 disorder (HCC)    Cancer (HCC)    CHEST PAIN 12/04/2008   Qualifier: Diagnosis of  By: [name] MD, [name]    Chest pain, rule out acute myocardial infarction 05/10/2022   Coronary artery disease    Depressive disorder, not elsewhere classified    Dermatophytosis of nail    Esophageal reflux    History of kidney stones    HYPERGLYCEMIA 07/10/2007   Qualifier: Diagnosis of  By: [name] MD, [name]    Low back pain 09/18/2013   Other chronic nonalcoholic liver disease    Pre-diabetes    Problems with hearing    Prostatitis, unspecified    Pure hypercholesterolemia    Screen for STD (sexually transmitted disease) 04/20/2011   Sprain of cruciate ligament of knee    Tear of medial cartilage or meniscus of knee, current    TMJ (temporomandibular joint disorder) 12/25/2010   Unspecified essential hypertension    Unspecified gastritis and gastroduodenitis without mention of hemorrhage  Urethritis, unspecified      Past Surgical History:  Procedure Laterality Date   CORONARY ARTERY BYPASS GRAFT N/A 05/12/2022   Procedure: CORONARY ARTERY BYPASS GRAFTING (CABG) X FIVE BYPASSES USING OPEN LEFT INTERNAL MAMMARY ARTERY, OPEN LEFT RADIAL ARTERY, AND ENDOSCOPIC RIGHT GREATER SAPHENOUS VEIN HARVEST.;  Surgeon: [name], MD;  Location: [facility] OR;  Service: Open Heart Surgery;  Laterality: N/A;   CYSTOSCOPY W/ LITHOLAPAXY / EHL     IR CV LINE INJECTION  12/23/2023   IR TUNNELED  CENTRAL VENOUS CATH PLC W IMG  12/28/2023   LEFT HEART CATH AND CORONARY ANGIOGRAPHY N/A 05/10/2022   Procedure: LEFT HEART CATH AND CORONARY ANGIOGRAPHY;  Surgeon: [name], MD;  Location: [facility] INVASIVE CV LAB;  Service: Cardiovascular;  Laterality: N/A;   No prior surgery     RADIAL ARTERY HARVEST Left 05/12/2022   Procedure: RADIAL ARTERY HARVEST;  Surgeon: [name], MD;  Location: [facility] OR;  Service: Open Heart Surgery;  Laterality: Left;   ROBOT ASSISTED LAPAROSCOPIC RADICAL PROSTATECTOMY N/A 11/17/2023   Procedure: PROSTATECTOMY, RADICAL, ROBOT-ASSISTED, LAPAROSCOPIC;  Surgeon: [name], MD;  Location: [facility] ORS;  Service: Urology;  Laterality: N/A;  ROBOTIC RADICAL PROSTATECTOMY AND BILATERAL PELVIC LYMPH NODE DISSECTION   TEE WITHOUT CARDIOVERSION N/A 05/12/2022   Procedure: TRANSESOPHAGEAL ECHOCARDIOGRAM;  Surgeon: [name], MD;  Location: [facility] OR;  Service: Open Heart Surgery;  Laterality: N/A;       Review of Systems  Constitutional:  Negative for chills, diaphoresis, fatigue and fever.  Respiratory:  Negative for cough, chest tightness, shortness of breath and wheezing.   Cardiovascular:  Negative for chest pain.  Gastrointestinal:  Negative for abdominal pain, diarrhea, nausea and vomiting.    Objective:   BP 128/86   Pulse 100   Temp 98.2 F (36.8 C) (Oral)   Wt 216 lb (98 kg)   SpO2 95%   BMI 35.94 kg/m  Nursing note and vital signs reviewed.  Physical Exam Constitutional:      General: He is not in acute distress.    Appearance: He is well-developed.  Cardiovascular:     Rate and Rhythm: Normal rate and regular rhythm.     Heart sounds: Normal heart sounds.  Pulmonary:     Effort: Pulmonary effort is normal.     Breath sounds: Normal breath sounds.  Abdominal:     Comments: Percutaneous drain in place. No drainage in bag. Appears to have clear yellow fluid in in tubing.   Skin:    General: Skin is warm and dry.   Neurological:     Mental Status: He is alert and oriented to person, place, and time.          No data to display           Assessment & Plan:    Patient Active Problem List   Diagnosis Date Noted   Pressure injury of buttock, unstageable (HCC) 12/28/2023   Hx of CABG 12/26/2023   History of abdominal prostatectomy 12/17/2023   Intra-abdominal abscess (HCC) 12/17/2023   Acute renal failure on dialysis 12/17/2023   Class 2 obesity with body mass index (BMI) of 35 to 39.9 without comorbidity 12/17/2023   Prostate cancer (HCC) 11/17/2023   Coronary artery disease 05/12/2022   Facial droop 05/10/2022   Hand paresthesia 04/03/2020   Frequent urination 04/03/2020   Prostate cancer screening 04/03/2020   CAD (coronary artery disease) 04/01/2020   Low back pain 09/18/2013   TMJ (  temporomandibular joint disorder) 12/25/2010   TINEA VERSICOLOR 10/29/2009   MEDIAL MENISCUS TEAR, LEFT 05/31/2008   ACL TEAR, LEFT KNEE 05/31/2008   Pain in joint, forearm 05/28/2008   PROBLEMS WITH HEARING 03/18/2008   Prediabetes 07/10/2007   Dyslipidemia, goal LDL below 70 09/06/2006   Anxiety state 09/06/2006   DEPRESSION 09/06/2006   Essential hypertension 09/06/2006   GERD 09/06/2006   FATTY LIVER DISEASE 09/06/2006     Problem List Items Addressed This Visit       Other   Intra-abdominal abscess (HCC) - Primary   Mr. Puff has MSSA intra-abdominal abscess status post percutaneous drain placement and completion of 10 days of amoxicillin /clavulanate.  Percutaneous drain remains in place with no antibiotics for the last month and no fevers, chills, or night sweats.  Follow-up imaging planned for 02/20/2024.  Recommend continuing to monitor off antibiotics at this time and will await CT imaging for any additional treatment if necessary.  Continue drain management per interventional radiology.  Follow-up with ID as needed pending CT imaging results        I am having Jahmier T. Wiemers  maintain his docusate sodium , amLODipine , atorvastatin , carvedilol , feeding supplement, multivitamin, Gerhardt's butt cream, and leptospermum manuka honey.   Follow-up: Pending CT results if needed.   Cathlyn July, MSN, FNP-C Nurse Practitioner [facility] for Infectious Disease [facility] Main number: [phone]

## 2024-02-15 NOTE — Assessment & Plan Note (Signed)
 Glenn Stewart has MSSA intra-abdominal abscess status post percutaneous drain placement and completion of 10 days of amoxicillin /clavulanate.  Percutaneous drain remains in place with no antibiotics for the last month and no fevers, chills, or night sweats.  Follow-up imaging planned for 02/20/2024.  Recommend continuing to monitor off antibiotics at this time and will await CT imaging for any additional treatment if necessary.  Continue drain management per interventional radiology.  Follow-up with ID as needed pending CT imaging results

## 2024-02-15 NOTE — Patient Instructions (Signed)
 Nice to see you.  No further antibiotics at this time and will await the CT imaging.  Follow up with ID pending imaging results if needed.   Have a great day and stay safe!

## 2024-02-15 NOTE — Telephone Encounter (Signed)
 Patient returned call, he states he was never told he needed to flush his drain so it got clogged. Encouraged him to reach out to drain clinic since our office is only managing the IV antibiotics. He states he realizes he has the wrong number, provided him with number to drain clinic.   Emily Massar, BSN, RN

## 2024-02-15 NOTE — Telephone Encounter (Signed)
 Received voicemail from patient requesting call back to discuss drain removal. Called patient, call was answered but dropped. Called back, no answer. Left HIPAA compliant voicemail requesting callback.   He will need to reach out to the drain clinic at [phone].   Trystin Hargrove, BSN, RN

## 2024-02-19 ENCOUNTER — Other Ambulatory Visit: Payer: Self-pay | Admitting: Student

## 2024-02-20 ENCOUNTER — Ambulatory Visit
Admission: RE | Admit: 2024-02-20 | Discharge: 2024-02-20 | Disposition: A | Source: Ambulatory Visit | Attending: General Surgery | Admitting: General Surgery

## 2024-02-20 DIAGNOSIS — K651 Peritoneal abscess: Secondary | ICD-10-CM

## 2024-02-20 HISTORY — PX: IR RADIOLOGIST EVAL & MGMT: IMG5224

## 2024-02-20 MED ORDER — IOPAMIDOL (ISOVUE-300) INJECTION 61%
80.0000 mL | Freq: Once | INTRAVENOUS | Status: AC | PRN
  Administered 2024-02-20: 80 mL via INTRAVENOUS

## 2024-02-20 MED ORDER — IOPAMIDOL (ISOVUE-300) INJECTION 61%
10.0000 mL | Freq: Once | INTRAVENOUS | Status: AC | PRN
  Administered 2024-02-20: 10 mL

## 2024-02-20 NOTE — Progress Notes (Signed)
 Referring Physician(s): [place]  Chief Complaint: The patient is seen in follow up today s/p drain placed on 12/17/2023 for intra-abdominal abscess.   History of present illness: Patient was in the hospital when drain was injected on 12/23/2023 with small remaining abscess noted without fistula and drain was left in. Patient was discharged from the hospital on 01/02/2024. IR was not notified when patient was discharged. Patient denies output from bag in several weeks. He completed Augmentin  01/08/2024.   He has been flushing his drain with 5 mL of sterile saline twice a day for the past 10 days with little output. Denies abdominal pain, nausea/vomiting/diarrhea, fever, chills. He reports having normal bowel movements. Patient presents for drain injection and possible removal of drain today.    Past Medical History:  Diagnosis Date   ADHD (attention deficit hyperactivity disorder)    Anxiety state, unspecified    Atrial fibrillation with RVR (HCC) 12/19/2023   Bipolar 1 disorder (HCC)    Cancer (HCC)    CHEST PAIN 12/04/2008   Qualifier: Diagnosis of  By: [name] MD, [name]    Chest pain, rule out acute myocardial infarction 05/10/2022   Coronary artery disease    Depressive disorder, not elsewhere classified    Dermatophytosis of nail    Esophageal reflux    History of kidney stones    HYPERGLYCEMIA 07/10/2007   Qualifier: Diagnosis of  By: [name] MD, [name]    Low back pain 09/18/2013   Other chronic nonalcoholic liver disease    Pre-diabetes    Problems with hearing    Prostatitis, unspecified    Pure hypercholesterolemia    Screen for STD (sexually transmitted disease) 04/20/2011   Sprain of cruciate ligament of knee    Tear of medial cartilage or meniscus of knee, current    TMJ (temporomandibular joint disorder) 12/25/2010   Unspecified essential hypertension    Unspecified gastritis and gastroduodenitis without mention of hemorrhage    Urethritis,  unspecified     Past Surgical History:  Procedure Laterality Date   CORONARY ARTERY BYPASS GRAFT N/A 05/12/2022   Procedure: CORONARY ARTERY BYPASS GRAFTING (CABG) X FIVE BYPASSES USING OPEN LEFT INTERNAL MAMMARY ARTERY, OPEN LEFT RADIAL ARTERY, AND ENDOSCOPIC RIGHT GREATER SAPHENOUS VEIN HARVEST.;  Surgeon: [name], MD;  Location: [facility] OR;  Service: Open Heart Surgery;  Laterality: N/A;   CYSTOSCOPY W/ LITHOLAPAXY / EHL     IR CV LINE INJECTION  12/23/2023   IR RADIOLOGIST EVAL & MGMT  02/20/2024   IR TUNNELED CENTRAL VENOUS CATH PLC W IMG  12/28/2023   LEFT HEART CATH AND CORONARY ANGIOGRAPHY N/A 05/10/2022   Procedure: LEFT HEART CATH AND CORONARY ANGIOGRAPHY;  Surgeon: [name], MD;  Location: [facility] INVASIVE CV LAB;  Service: Cardiovascular;  Laterality: N/A;   No prior surgery     RADIAL ARTERY HARVEST Left 05/12/2022   Procedure: RADIAL ARTERY HARVEST;  Surgeon: [name], MD;  Location: [facility] OR;  Service: Open Heart Surgery;  Laterality: Left;   ROBOT ASSISTED LAPAROSCOPIC RADICAL PROSTATECTOMY N/A 11/17/2023   Procedure: PROSTATECTOMY, RADICAL, ROBOT-ASSISTED, LAPAROSCOPIC;  Surgeon: [name], MD;  Location: [facility] ORS;  Service: Urology;  Laterality: N/A;  ROBOTIC RADICAL PROSTATECTOMY AND BILATERAL PELVIC LYMPH NODE DISSECTION   TEE WITHOUT CARDIOVERSION N/A 05/12/2022   Procedure: TRANSESOPHAGEAL ECHOCARDIOGRAM;  Surgeon: [name], MD;  Location: [facility] OR;  Service: Open Heart Surgery;  Laterality: N/A;    Allergies: Sulfonamide derivatives  Medications: Prior to Admission  medications   Medication Sig Start Date End Date Taking? Authorizing Provider  amLODipine  (NORVASC ) 5 MG tablet Take 1 tablet (5 mg total) by mouth daily. 12/07/23   Pietro Redell RAMAN, MD  atorvastatin  (LIPITOR ) 80 MG tablet Take 1 tablet (80 mg total) by mouth daily. 12/07/23   Pietro Redell RAMAN, MD  carvedilol  (COREG ) 12.5 MG tablet Take 1 tablet (12.5 mg  total) by mouth 2 (two) times daily with a meal. 01/02/24   Rosario Leatrice FERNS, MD  docusate sodium  (COLACE) 100 MG capsule Take 1 capsule (100 mg total) by mouth 2 (two) times daily. 11/17/23   Cory Palma, PA-C  feeding supplement (ENSURE PLUS HIGH PROTEIN) LIQD Take 237 mLs by mouth 2 (two) times daily between meals. 01/02/24   Rosario Leatrice FERNS, MD  leptospermum manuka honey (MEDIHONEY) PSTE paste Apply 1 Application topically daily. 01/02/24   Rosario Leatrice I, MD  multivitamin (RENA-VIT) TABS tablet Take 1 tablet by mouth at bedtime. 01/02/24   Rosario Leatrice FERNS, MD  Nystatin (GERHARDT'S BUTT CREAM) CREA Apply 1 Application topically 2 (two) times daily. 01/02/24   Rosario Leatrice FERNS, MD     Family History  Problem Relation Age of Onset   Diabetes Father    Hypertension Father    Heart failure Father     Social History   Socioeconomic History   Marital status: Single    Spouse name: Not on file   Number of children: 0   Years of education: Not on file   Highest education level: Not on file  Occupational History   Occupation: Owns motorcycle shop  Tobacco Use   Smoking status: Never   Smokeless tobacco: Never  Vaping Use   Vaping status: Never Used  Substance and Sexual Activity   Alcohol use: Yes    Comment: rare   Drug use: No   Sexual activity: Not on file  Other Topics Concern   Not on file  Social History Narrative   Not on file   Social Drivers of Health   Financial Resource Strain: Not on file  Food Insecurity: No Food Insecurity (11/17/2023)   Hunger Vital Sign    Worried About Running Out of Food in the Last Year: Never true    Ran Out of Food in the Last Year: Never true  Transportation Needs: No Transportation Needs (11/17/2023)   PRAPARE - Administrator, Civil Service (Medical): No    Lack of Transportation (Non-Medical): No  Physical Activity: Not on file  Stress: Not on file  Social Connections: Not on file     Vital  Signs: BP 134/79 (BP Location: Left Arm, Patient Position: Sitting, Cuff Size: Normal)   Pulse 89   Temp 97.8 F (36.6 C) (Oral)   Resp 20   SpO2 97%   Physical Exam Skin:    General: Skin is warm.     Comments: Right abdominal drain- Site non-tender, non-erythematous, no ecchymosis, no drainage, no sign of infection     Imaging: IR Radiologist Eval & Mgmt Result Date: 02/20/2024 EXAM: ESTABLISHED PATIENT VISIT CHIEF COMPLAINT: See below HISTORY OF PRESENT ILLNESS: See below REVIEW OF SYSTEMS: See below PHYSICAL EXAMINATION: See below ASSESSMENT AND PLAN: Please refer to completed note in the electronic medical record on [facility] Thom Hall, MD [facility] Radiology Electronically Signed   By: Thom Hall M.D.   On: 02/20/2024 10:02    Labs:  CBC: Recent Labs  12/21/23 0432 12/22/23 0359 12/23/23 0638 12/23/23 1129 12/28/23 0232  WBC 16.9* 13.7* 11.0*  --  11.0*  HGB 10.5* 10.6* 11.3* 10.5* 10.8*  HCT 32.0* 31.8* 35.1* 31.0* 33.3*  PLT 310 338 359  --  411*    COAGS: Recent Labs    12/16/23 1524  INR 1.2  APTT 29    BMP: Recent Labs    12/30/23 0721 12/31/23 0604 01/01/24 0444 01/02/24 0450  NA 130* 128* 131* 133*  K 4.5 4.1 4.3 4.3  CL 90* 91* 93* 94*  CO2 17* 18* 27 25  GLUCOSE 107* 119* 138* 108*  BUN 117* 124* 63* 70*  CALCIUM  7.8* 8.0* 7.9* 8.1*  CREATININE 5.25* 5.12* 3.58* 3.96*  GFRNONAA 12* 12* 19* 17*    LIVER FUNCTION TESTS: Recent Labs    12/22/23 0359 12/22/23 1527 12/24/23 0512 12/25/23 0630 12/26/23 0316 12/27/23 0813 12/29/23 0221 12/30/23 0721 12/31/23 0604 01/02/24 0450  BILITOT 1.0  --  0.7 0.9 1.1  --   --   --   --   --   AST 36  --  32 30 39  --   --   --   --   --   ALT 25  --  20 20 27   --   --   --   --   --   ALKPHOS 97  --  90 82 92  --   --   --   --   --   PROT 6.0*  --  6.0* 5.9* 6.6  --   --   --   --   --   ALBUMIN  <1.5*  <1.5*   < > 1.6* 1.6*  1.9*   < > 2.0* 2.2* 2.2* 2.2*   < > = values in this interval not displayed.    Assessment:  CT scan today reviewed by Dr Thom Hall revealed fluid collection nearly resolved Drain injection today revealed no fistula Drain removed per Dr Thom Hall Patient tolerated well Dressing applied  Patient to follow up with surgery team and infectious disease team as needed.    Signed: Loneta Tamplin B Blakelynn Scheeler, NP 02/20/2024, 10:34 AM    I spent a total of 15 minutes of face-to-face time in clinical consultation, greater than 50% of which was counseling/coordinating care for evaluation of drain follow up.  Please refer to Dr. Thom Hall attestation of this note for management and plan.

## 2024-02-27 NOTE — Progress Notes (Signed)
 Patient Name:     Glenn Stewart, Male Date of Birth:           12-01-1965, 58 y.o. MEDICAL RECORD NUMBER      [id number] Date:                  02/28/2024 Ref Provider:            No primary care provider on file.   History of Present Illness Glenn Stewart is a 58 y.o. male is a prior patient at [facility]. HD treatments 10/1-10/29 post AKI suspected 2/2 ATN. He presents today for a post HD nephrology f/u.    Interval history: Denies recent hospitalization or acute illness.  No recent antibiotic use.   ROS: Denies fatigue, dizziness, chest pain, or shortness of breath.  No anorexia, nausea, vomiting, or dysgeusia.  No urinary complaints.  Denies foamy urine.  Denies edema. Drinks 64 oz of water  daily.  Limits Na in diet. No recent use of NSAIDS or IV contrast.   Home blood pressures: Home SBP 120s. Elevated on OV. States its elevated today due to stress.   States he does not have A Fib.  States he is cancer free. F/u with Dr. Morene LELON Salines with [facility] in [place], [place].   He currently lives near [place] but would like to continue to f/u with [facility] for now.     Recall:  [name] 58 year old male with history of 5 vessel CABG 2024, history of atrial fibrillation, hypertension, hyperlipidemia who underwent robotic assisted laparoscopic radical prostatectomy 11/17/2023 for prostate cancer and presented to the ED 12/16/2023 with weakness, encephalopathy, and a distended and tender abdomen. In the ER he had A-fib with RVR with V. Tach requiring cardioversion. Subsequent to cardioversion he became hypotensive with respiratory distress and was intubated and placed on pressors. CT with IV contrast exposure 9/26 showed fluid-filled SBO, concern for perforation, kidneys unremarkable. Received Vanc/Zosyn . 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    Presenting creatinine 3.9 worsened to 5.1. He is anuric.  No  evidence of obstruction on imaging at presentation. AKI suspected 2/2 ATN.   Baseline normal eGFR. First HD 12/21/2023. 12/27/2023 with some signs of renal recover, UP 2250 mL. Last outpatient HD session 01/18/2024.    [facility] ICHD sCr trend: 3.58 01/04/2024 2.76 01/11/2024 1.97 01/18/2024     The following portions of the patient's chart were reviewed in this encounter and updated as appropriate:  Allergies  Meds       Patient Active Problem List  Diagnosis   Essential hypertension   History of acute kidney injury   Hyperphosphatemia   Vitamin D deficiency     Past Medical History:  Diagnosis Date   Acute renal failure on dialysis (HCC) 12/17/2023   No past surgical history on file. No family history on file. Social History   Socioeconomic History   Marital status: Not on file    Spouse name: Not on file   Number of children: Not on file   Years of education: Not on file   Highest education level: Not on file  Occupational History   Not on file  Tobacco Use   Smoking status: Not on file   Smokeless tobacco: Not on file  Substance and Sexual Activity   Alcohol use: Not on file   Drug use: Not on file   Sexual activity: Not on file  Other Topics Concern  Not on file  Social History Narrative   Not on file   Social Drivers of Health   Financial Resource Strain: Not on file  Food Insecurity: No Food Insecurity (11/17/2023)   Received from [facility]   Hunger Vital Sign    Within the past 12 months, you worried that your food would run out before you got the money to buy more.: Never true    Within the past 12 months, the food you bought just didn't last and you didn't have money to get more.: Never true  Transportation Needs: No Transportation Needs (11/17/2023)   Received from [facility] - Transportation    In the past 12 months, has lack of transportation kept you from medical appointments or from getting medications?: No     In the past 12 months, has lack of transportation kept you from meetings, work, or from getting things needed for daily living?: No  Physical Activity: Not on file  Stress: Not on file  Social Connections: Not on file  Intimate Partner Violence: Not At Risk (11/17/2023)   Received from [facility]   Humiliation, Afraid, Rape, and Kick questionnaire    Within the last year, have you been afraid of your partner or ex-partner?: No    Within the last year, have you been humiliated or emotionally abused in other ways by your partner or ex-partner?: No    Within the last year, have you been kicked, hit, slapped, or otherwise physically hurt by your partner or ex-partner?: No    Within the last year, have you been raped or forced to have any kind of sexual activity by your partner or ex-partner?: No  Housing Stability: Low Risk  (11/17/2023)   Received from [facility] Stability Vital Sign    In the last 12 months, was there a time when you were not able to pay the mortgage or rent on time?: No    In the past 12 months, how many times have you moved where you were living?: 0    At any time in the past 12 months, were you homeless or living in a shelter (including now)?: No   Current Outpatient Medications  Medication Sig Dispense Refill   amLODIPine  (NORVASC ) 5 MG tablet Take 5 mg by mouth 1 (one) time each day     amoxicillin  (AMOXIL ) 875 MG tablet Take 1 tablet by mouth in the morning and 1 tablet in the evening.     atorvastatin  (LIPITOR ) 80 MG tablet Take 1 tablet by mouth in the morning.     carvedilol  (Coreg ) 12.5 MG tablet Take 1 tablet by mouth in the morning and 1 tablet in the evening.     Docusate Sodium  (DSS) 100 MG capsule Take 100 mg by mouth     Ferrous Sulfate (Iron) 325 (65 Fe) MG tablet Take 1 tablet by mouth     metroNIDAZOLE  (FLAGYL ) 500 MG tablet Take 1 tablet by mouth in the morning and 1 tablet in the evening.     nystatin (MYCOSTATIN) cream Apply 1  Application topically     sevelamer  carbonate (RENVELA ) 800 MG tablet Take 4 tablets by mouth 1 (one) time each day at the same time     UNABLE TO FIND LEPTOSPERMUM po     B Complex-C-Folic Acid  (Rena-Vite) tablet Take 1 tablet by mouth at bed time (Patient not taking: Reported on 02/28/2024)     Heparin  Sodium, Porcine, (heparin , porcine,) 1000 UNIT/ML  injection Heparin  Sodium (Porcine) 1,000 Units/mL Systemic (Patient not taking: Reported on 02/28/2024)     Multiple Vitamin (MULTIVITAMIN ADULT PO) Take 1 tablet by mouth in the morning. (Patient not taking: Reported on 02/28/2024)     Nutritional Supplements (Ensure Plus High Protein) liquid Take 237 mL by mouth (Patient not taking: Reported on 02/28/2024)     VITAMIN D PO Take 0.25 mcg by mouth (Patient not taking: Reported on 02/28/2024)     Wound Dressings (Medihoney Wound/Burn Dressing) paste Apply 1 Application topically (Patient not taking: Reported on 02/28/2024)     No current facility-administered medications for this visit.     Vitals:   02/28/24 0812  BP: 152/92  BP Location: Right upper arm  Patient Position: Sitting  BP Cuff Size: Large adult long  Pulse: 91  Temp: 97.5 F  Weight: 221 lb 9.6 oz (101 kg)  Height: 5' 7 (1.702 m)      ROS  A comprehensive 10-system review was performed with the patient today and was negative except as noted in the HPI.   Physical Exam Physical Exam General:  Well developed, well nourished. No acute distress. Heart:  Regular rate and rhythm. Lungs:  Clear to auscultation bilaterally. Extremities:  No edema.   LABS: Chemistry  Lab Units 02/27/24 1101 01/18/24 0000 01/11/24 0000 01/04/24 0000  SODIUM mmol/L 141 140 139 132*  POTASSIUM mmol/L 4.4 4.1 4.0 3.9  CHLORIDE mmol/L 104 101 99 98  CO2 mmol/L 24 28 32* 24  GLUCOSE mg/dL 888* 892*  --   --   BUN mg/dL 27* 27* 32* 79*  CREATININE mg/dL 8.38* 8.02* 7.23* 6.41*  EGFR mL/min/1.73 49*  --   --   --   ALBUMIN  g/dL 4.4  --   3.6  --   CALCIUM  mg/dL 9.7 8.7  --  8.3*  PHOSPHORUS mg/dL 4.0  --   --  4.6*  WBC AUTO x10E3/uL 5.5  --   --  7.61  HEMOGLOBIN g/dL 88.1* 7.5* 8.2* 89.9*  HEMATOCRIT % 37.5  --   --  31.6*  PLATELETS AUTO x10E3/uL 320  --   --  194   Iron Studies  Lab Units 01/04/24 0000  IRON SATURATION % 8*  FERRITIN ng/mL 587*   Bone Mineral  Lab Units 02/27/24 1101 01/04/24 0000  PTH pg/mL  --  1,259*  VIT D 25 HYDROXY ng/mL 27.2*  --       Lab Results  Component Value Date   COLORU Yellow 02/27/2024   GLUCOSEU Negative 02/27/2024   RBCU 0-2 02/27/2024   UROBILINOGEN 0.2 02/27/2024   LEUKOCYTESU Trace (A) 02/27/2024   NITRITEU Negative 02/27/2024        Problem List Items Addressed This Visit     Essential hypertension (Chronic)   Overview  Qualifier: Diagnosis of  By: Cecilie CMA, NANNIE Ivy      History of acute kidney injury - Primary   Vitamin D deficiency    AKI suspected 2/2 ATN with renal recovery - Most recent sCr 1.61mg /dl w/ eGFR 50fo/fpw from 1.97mg /dl.  - No uremic symptomatology. Educated on symptoms to monitor for - Appears euvolemic on exam - Counseled on avoiding nephrotoxins including NSAIDs, IV contrast, and high dose vitamin C. - Counseled on fluid intake of at least 2L/day or until urine is clear and a dietary Na restriction <2g/day. - Renally dose all medications for current GFR. -PC removed  Proteinuria - Urine studies to be followed per protocol. Complete today  -  Remains off ACE inhibitor/ARB for renal protection/antiproteinuric effect   Electrolytes/Acid-Base. -Labs are stable   MBD/Vitamin D Deficiency -Vitamin D 27 -PO4 4.0 -Remains Sevelamar 1 tab qac and 1 with snacks -Add iPTH to next lab visit.   Anemia  - No need for IV iron or ESA therapy at this time. - Will enroll in the anemia clinic when hgb <9.5. -Remains on PO iron QOD  Hypertension -Elevated on OV. States home SBP 120s. -Continue to monitor home BPs -Continue low  Na Diet  Cumulative time spent today with patient in the visit, reviewing data, coordination of care, and documentation 46 minutes.  Note to patient: The 21st Century Cures Act requires that medical notes like this be available to patients in the interest of transparency. However, be advised this is a medical document. It is intended as peer to peer communication. It is written in medical language and may contain abbreviations or verbiage that are unfamiliar. It may appear blunt or direct. Medical documents are intended to carry relevant information, facts as evident, and the clinical opinion of the practitioner.     Orders Placed This Encounter   Urine Protein / creatinine ratio   Return in about 2 weeks (around 03/13/2024), or if symptoms worsen or fail to improve, for Next scheduled follow-up.       Harlene LOISE Quale, FNP-BC

## 2024-02-28 ENCOUNTER — Other Ambulatory Visit: Payer: Self-pay | Admitting: Cardiology

## 2024-02-28 ENCOUNTER — Other Ambulatory Visit: Payer: Self-pay | Admitting: Student

## 2024-02-29 ENCOUNTER — Other Ambulatory Visit (HOSPITAL_COMMUNITY): Payer: Self-pay

## 2024-03-01 ENCOUNTER — Other Ambulatory Visit (HOSPITAL_COMMUNITY): Payer: Self-pay

## 2024-03-01 ENCOUNTER — Other Ambulatory Visit: Payer: Self-pay

## 2024-03-29 ENCOUNTER — Encounter: Payer: Self-pay | Admitting: Cardiology
